# Patient Record
Sex: Male | Born: 1955
Health system: Southern US, Community
[De-identification: ages and names within clinical notes are randomized; demographics above are authoritative.]

## PROBLEM LIST (undated history)

## (undated) DIAGNOSIS — I10 Essential (primary) hypertension: Secondary | ICD-10-CM

## (undated) DIAGNOSIS — E119 Type 2 diabetes mellitus without complications: Secondary | ICD-10-CM

## (undated) DIAGNOSIS — R7303 Prediabetes: Secondary | ICD-10-CM

## (undated) DIAGNOSIS — D509 Iron deficiency anemia, unspecified: Secondary | ICD-10-CM

## (undated) DIAGNOSIS — G4730 Sleep apnea, unspecified: Secondary | ICD-10-CM

## (undated) DIAGNOSIS — H9190 Unspecified hearing loss, unspecified ear: Secondary | ICD-10-CM

## (undated) DIAGNOSIS — E785 Hyperlipidemia, unspecified: Secondary | ICD-10-CM

## (undated) HISTORY — PX: WHIPPLE PROCEDURE: SHX2667

## (undated) HISTORY — PX: COLONOSCOPY: SHX174

## (undated) HISTORY — DX: Unspecified hearing loss, unspecified ear: H91.90

## (undated) HISTORY — DX: Essential (primary) hypertension: I10

## (undated) HISTORY — PX: GIVENS CAPSULE STUDY: SHX5432

## (undated) HISTORY — DX: Type 2 diabetes mellitus without complications: E11.9

## (undated) HISTORY — DX: Hyperlipidemia, unspecified: E78.5

## (undated) HISTORY — PX: SHOULDER SURGERY: SHX246

## (undated) HISTORY — DX: Iron deficiency anemia, unspecified: D50.9

## (undated) HISTORY — PX: KNEE SURGERY: SHX244

## (undated) HISTORY — DX: Sleep apnea, unspecified: G47.30

## (undated) HISTORY — DX: Prediabetes: R73.03

---

## 2003-05-27 ENCOUNTER — Other Ambulatory Visit: Admission: RE | Admit: 2003-05-27 | Discharge: 2003-05-27 | Payer: Self-pay | Admitting: Otolaryngology

## 2007-03-13 ENCOUNTER — Ambulatory Visit (HOSPITAL_COMMUNITY): Admission: RE | Admit: 2007-03-13 | Discharge: 2007-03-13 | Payer: Self-pay | Admitting: Family Medicine

## 2007-03-13 IMAGING — CR DG CHEST 2V
2 series · 2 of 2 positions shown · non-contrast
Comparison: none

CLINICAL DATA: Cough.
 CHEST - 2 VIEW:
 PA and lateral chest - [DATE].

[view not recorded (1 of 2)]
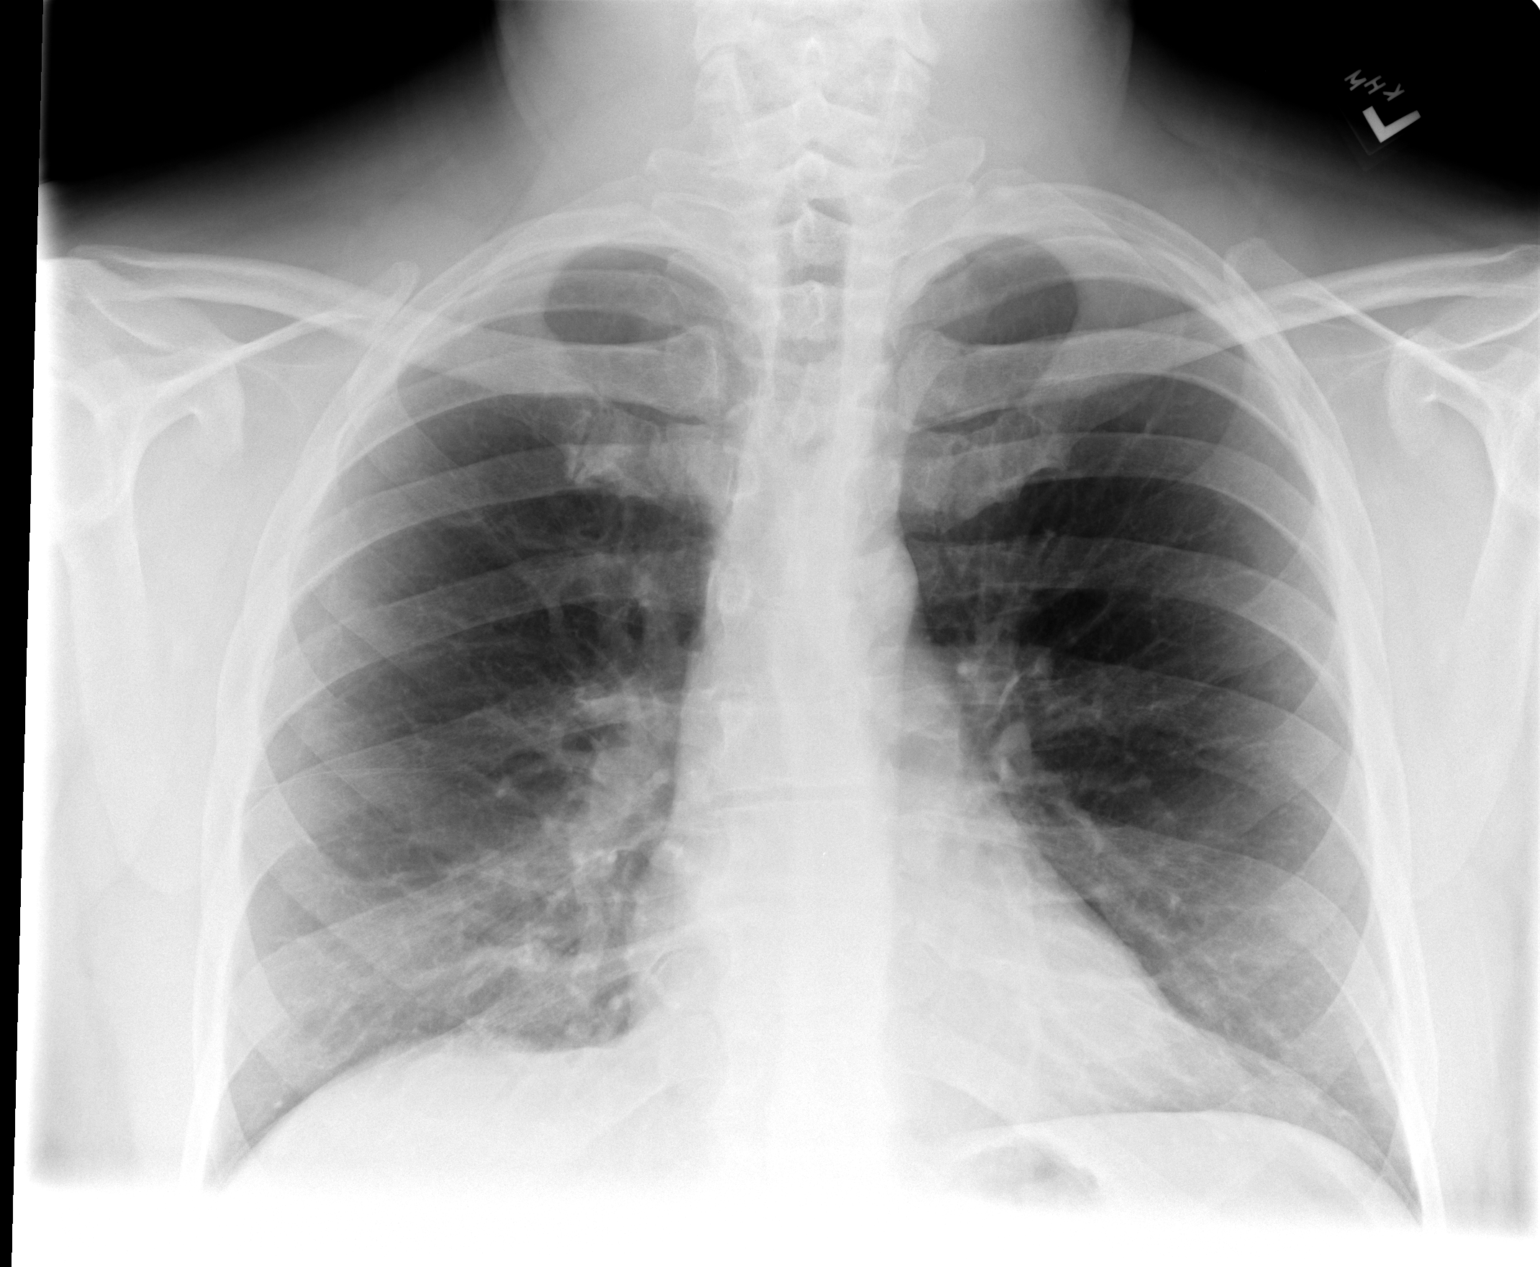

[view not recorded (2 of 2)]
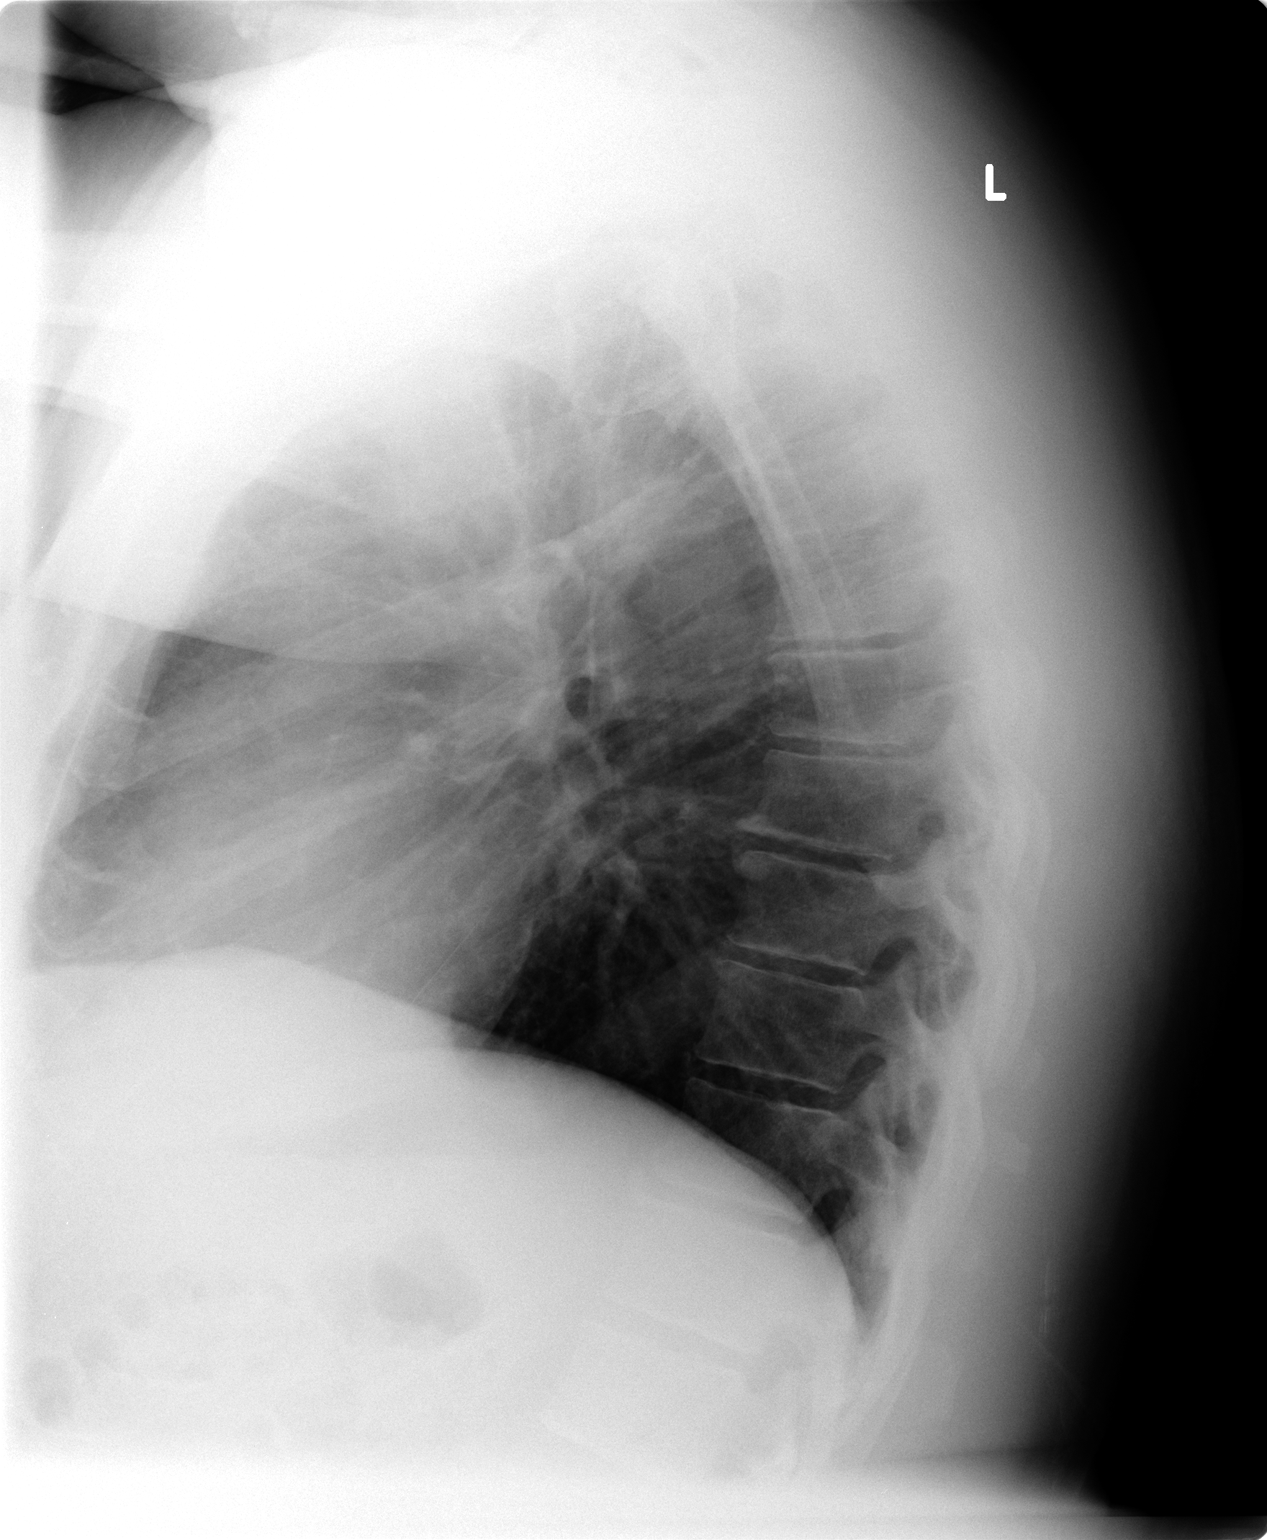

[2 of 2 positions shown; findings below may reference images not displayed]

FINDINGS: The heart size and vascularity are normal and the lungs are clear. No bony abnormality.
IMPRESSION: Normal chest.

## 2007-07-30 ENCOUNTER — Encounter: Payer: Self-pay | Admitting: Internal Medicine

## 2007-07-30 ENCOUNTER — Ambulatory Visit (HOSPITAL_COMMUNITY): Admission: RE | Admit: 2007-07-30 | Discharge: 2007-07-30 | Payer: Self-pay | Admitting: Internal Medicine

## 2007-07-30 ENCOUNTER — Ambulatory Visit: Payer: Self-pay | Admitting: Internal Medicine

## 2007-08-17 ENCOUNTER — Ambulatory Visit (HOSPITAL_COMMUNITY): Admission: RE | Admit: 2007-08-17 | Discharge: 2007-08-17 | Payer: Self-pay | Admitting: Internal Medicine

## 2007-08-25 ENCOUNTER — Ambulatory Visit: Payer: Self-pay | Admitting: Internal Medicine

## 2008-06-03 ENCOUNTER — Encounter: Admission: RE | Admit: 2008-06-03 | Discharge: 2008-06-03 | Payer: Self-pay | Admitting: Specialist

## 2008-06-03 IMAGING — US US EXTREM LOW VENOUS*L*
1 series · 14 of 24 positions shown · non-contrast
Comparison: None.

CLINICAL DATA: Calf swelling.  Recent knee arthroscopy.



[Series 1: us extrem low venous*left* · 14 of 27 slices shown]
[im 1/27]
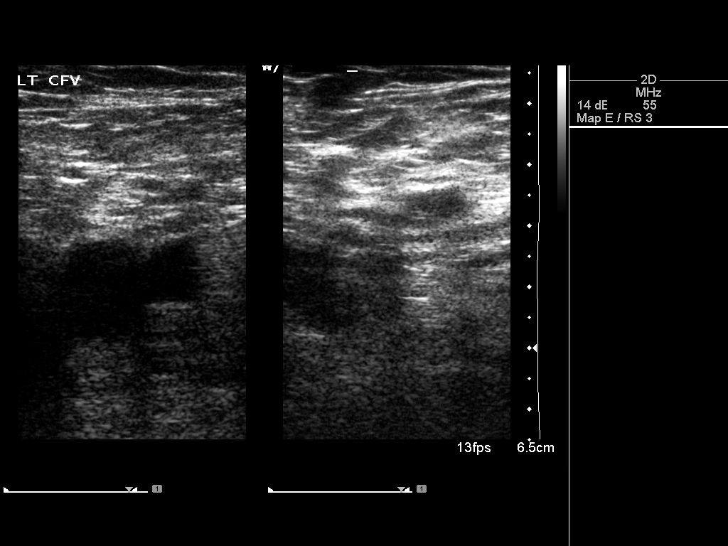
[im 3/27]
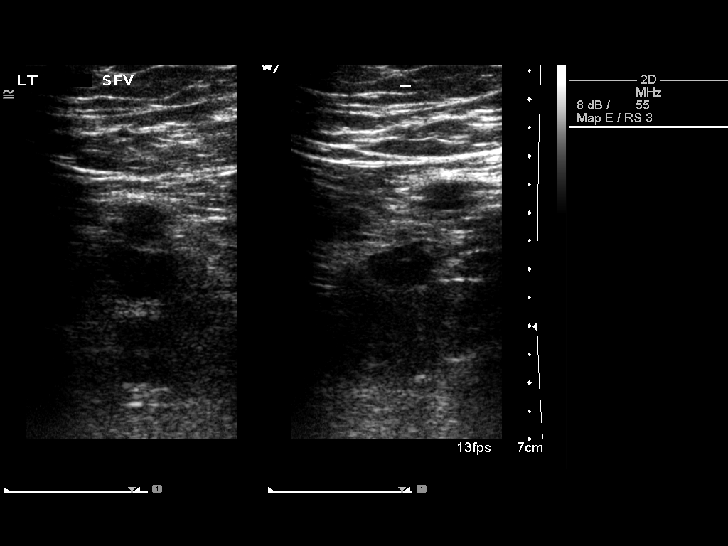
[im 5/27]
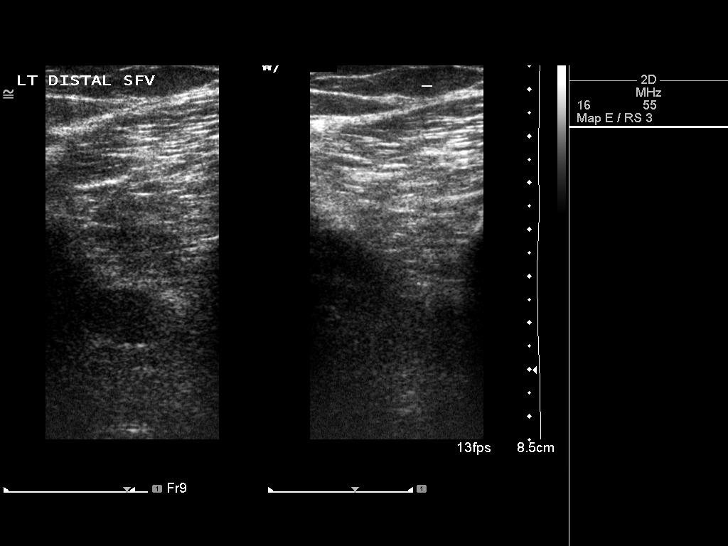
[im 7/27]
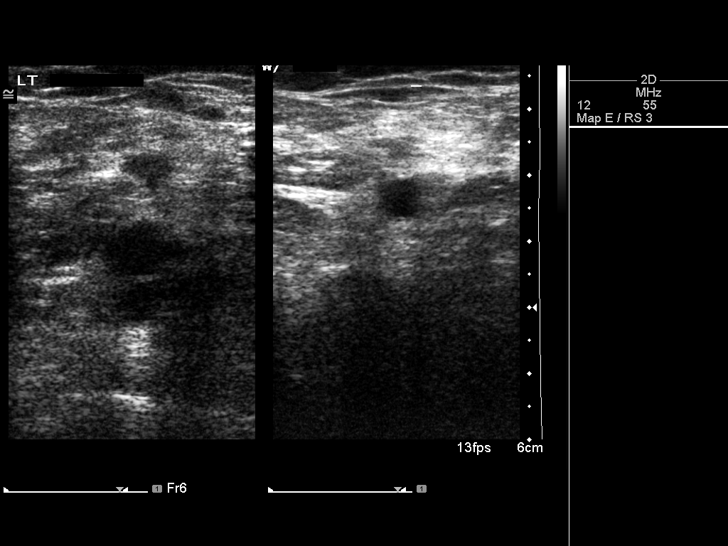
[im 8/27]
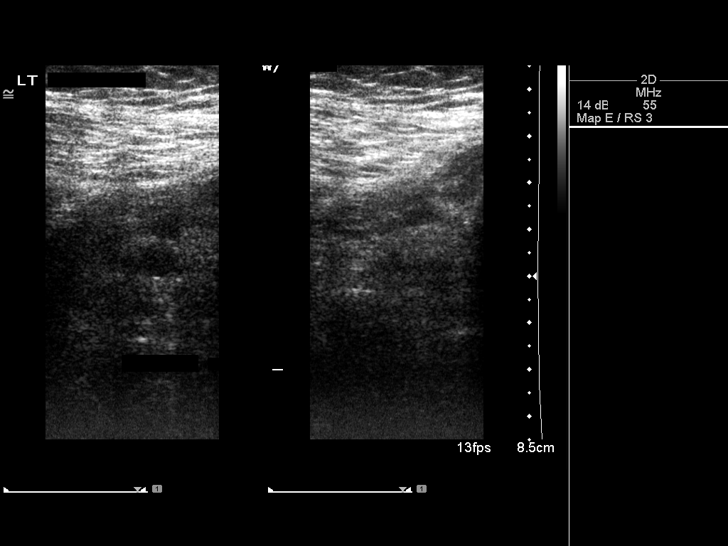
[im 11/27]
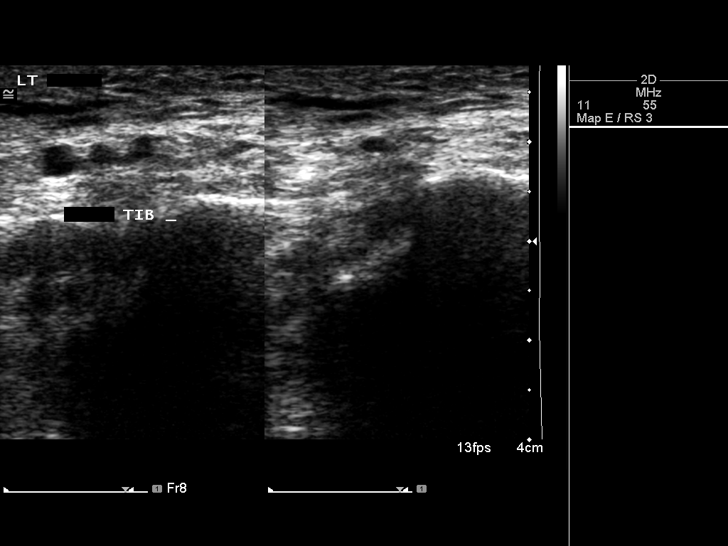
[im 13/27]
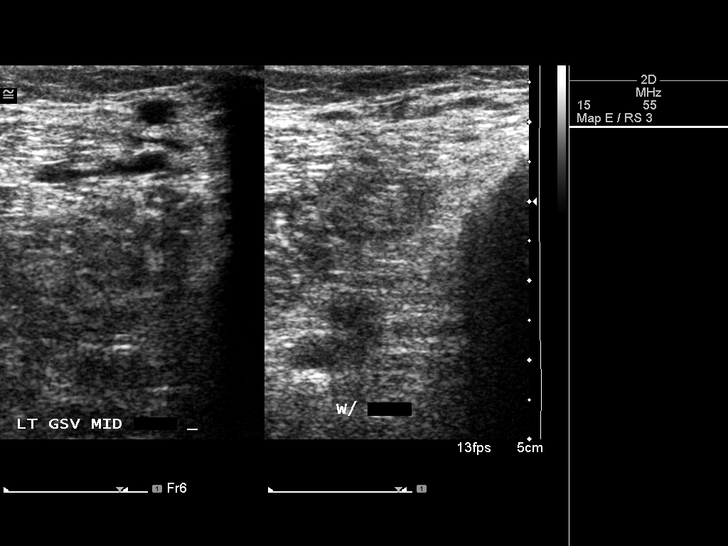
[im 14/27]
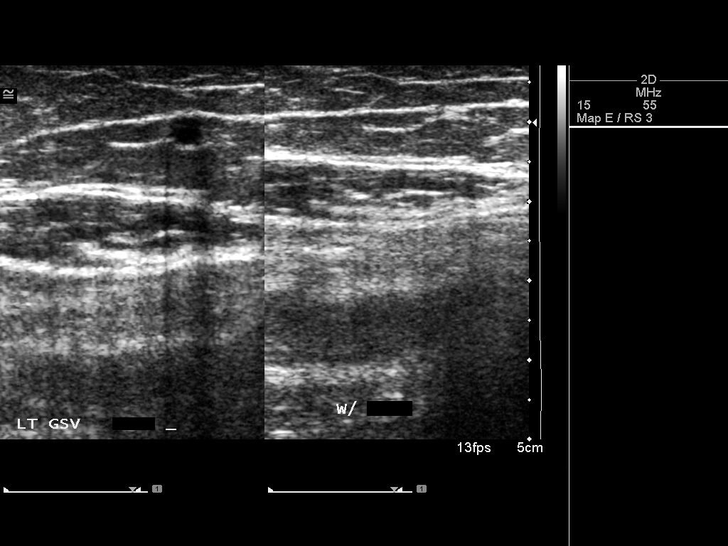
[im 16/27]
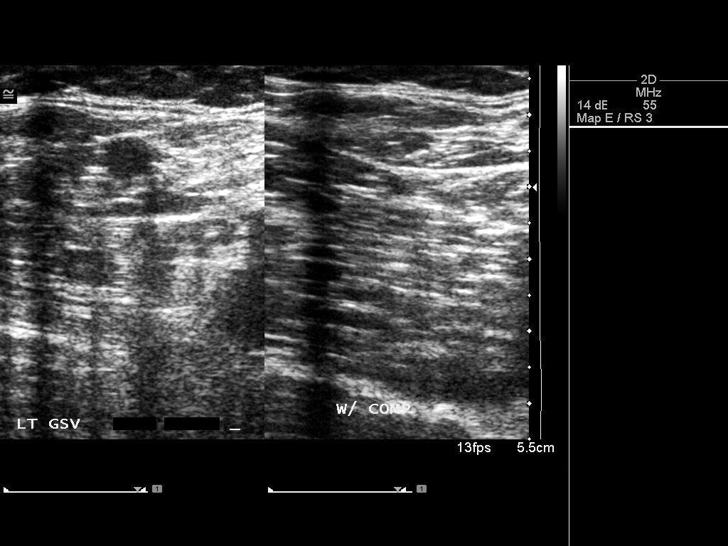
[im 19/27]
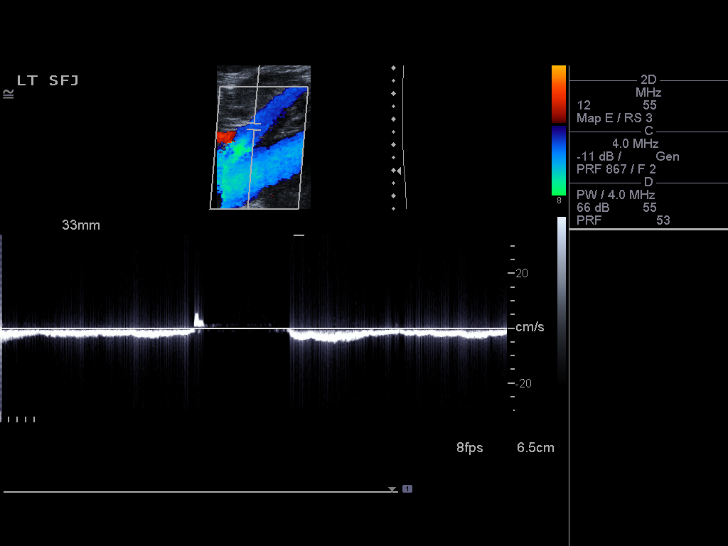
[im 21/27]
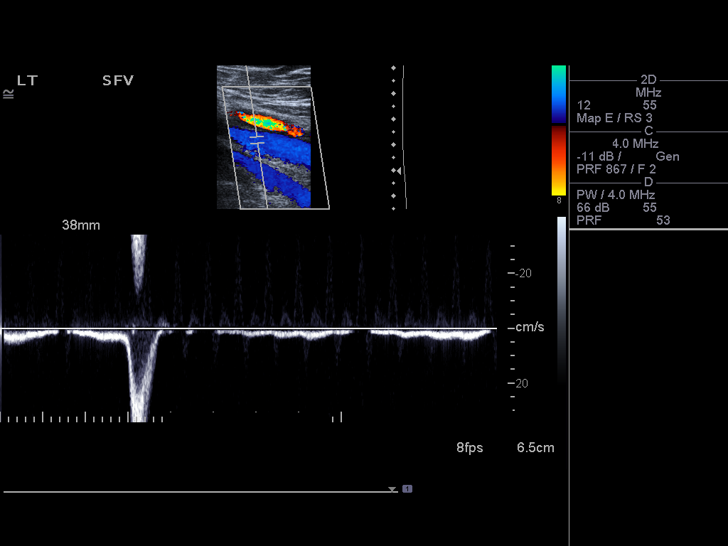
[im 22/27]
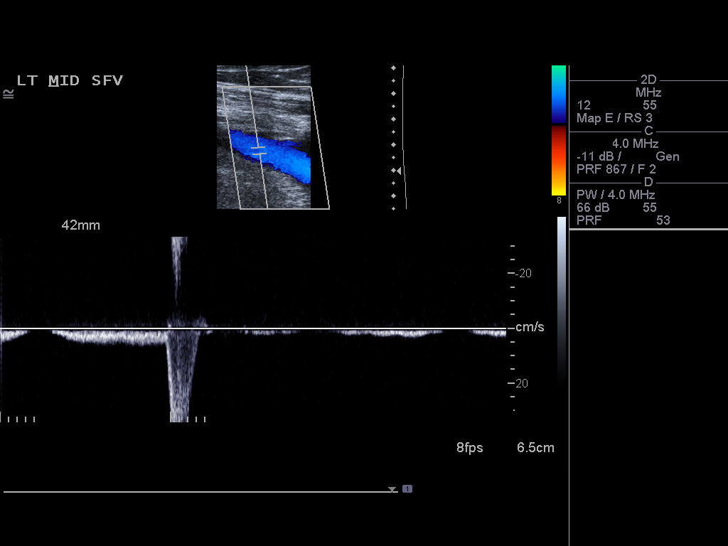
[im 24/27]
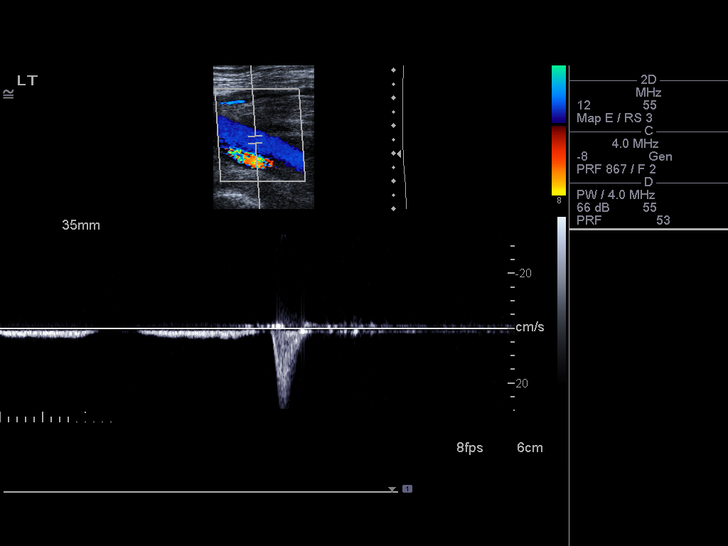
[im 27/27]
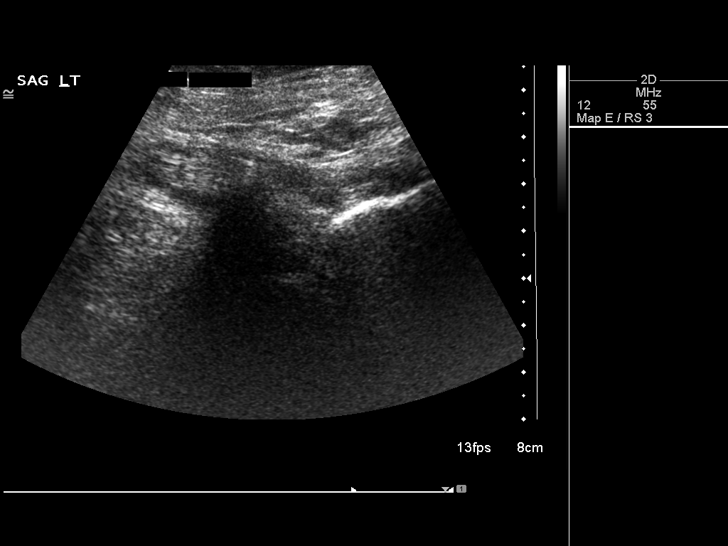

[14 of 24 positions shown; findings below may reference images not displayed]

FINDINGS: The left common femoral vein, superficial femoral vein,
deep femoral vein, saphenofemoral junction, and popliteal vein show
normal patent directional flow, normal phasicity, normal
augmentation, and normal compression.  The posterior tibial and
peroneal veins are patent where visualized in the upper calf.

Subcutaneous edema is noted in the lower leg.
IMPRESSION: No evidence for DVT in the left lower extremity.

## 2008-06-30 ENCOUNTER — Ambulatory Visit (HOSPITAL_BASED_OUTPATIENT_CLINIC_OR_DEPARTMENT_OTHER): Admission: RE | Admit: 2008-06-30 | Discharge: 2008-07-01 | Payer: Self-pay | Admitting: Specialist

## 2009-05-05 ENCOUNTER — Ambulatory Visit (HOSPITAL_BASED_OUTPATIENT_CLINIC_OR_DEPARTMENT_OTHER): Admission: RE | Admit: 2009-05-05 | Discharge: 2009-05-06 | Payer: Self-pay | Admitting: Specialist

## 2010-10-02 NOTE — Op Note (Signed)
NAME:  Alexander Banks, Alexander Banks              ACCOUNT NO.:  [id number]   MEDICAL RECORD NO.:  [id number]          PATIENT TYPE:  AMB   LOCATION:  DAY                           FACILITY:  [facility]   PHYSICIAN:  R. Roetta Sessions, M.D. DATE OF BIRTH:  May 13, 1956   DATE OF PROCEDURE:  07/30/2007  DATE OF DISCHARGE:                               OPERATIVE REPORT   PROCEDURE:  Ileocolonoscopy, snare polypectomy followed by EGD with  biopsy.   INDICATIONS FOR PROCEDURE:  The patient is a 55 year old gentleman  referred over by Dr. Lorin Picket A. Luking for further evaluation of iron  deficiency anemia.  Dr. Gerda Diss called me recently and was concerned  about the patient's anemia and bona fide iron deficiencies status.  I  understand he was hemoccult negative.  According to Dr. Gerda Diss his  ferritin was 4, IBC 473, serum iron 28 for saturation of 6%.  Hemoglobin  was in the 11 range.  This gentleman has a typical practice of donating  blood at the [facility].  He donated at least three times last year,  donated once in early January and subsequent to that was turned down as  he describes as his blood count was too low.  He has not had any  melena or hematochezia.  No abdominal pain.  Related no odynophagia,  dysphagia, early satiety, reflux symptoms.  No weight loss.  No family  history of inflammatory bowel disease or GI neoplasia.  Colonoscopy is  now being done to further evaluate iron deficiency anemia.  I told Mr.  Maglione if colonoscopy is not yielding as far as cause of ID we will  perform an EGD.  This approach has been reviewed with him in detail.  Risks, benefits and alternatives and limitations have been discussed and  questions answered.  Please see the doctor dictation in medical record.   PROCEDURE IN DETAIL:  O2 saturation, blood pressure, pulse and  respirations were monitored throughout the entirety of the procedure.  Conscious sedation with Versed 7 mg IV, Demerol 100 mg IV in divided  doses.   Cetacaine spray for topical pharyngeal anesthesia.   INSTRUMENTATION:  Pentax video chip system.   FINDINGS:  Digital rectal exam revealed no abnormalities.   Prep was good; mucosa surveyed from the rectosigmoid junction through  the left transverse right colon to the appendiceal orifice, ileocecal  valve and cecum where these structures were well seen and photographed  for the record.  From this level the scope was slowly and cautiously  withdrawn.  All previous mentioned mucosal surfaces were again seen.  The patient was noted to have scattered left-sided diverticula and in  the base of the cecum there is a 3 x 6 mm flat polyp.  Terminal ileum  was intubated to 10 cm.  The remainder of the colonic mucosa appeared  normal and the terminal ileal mucosa appeared normal.  The polyp in the  cecum was approached with the scope and the snare.  It was a very  difficult approach.  Ultimately I had to make two passes to remove this  polyp totally.  Two  pieces were submitted to the pathologist.  The  remainder of the colonic mucosa appeared entirely normal.  The scope was  pulled down in the rectum.  Thorough examination of the rectal mucosa  including retroflex view and anal verge demonstrated no abnormalities.  The patient tolerated the procedure well and was prepared for EGD.   Examination of the tubular esophagus revealed a noncritical Schatzki's  ring.  Otherwise esophageal mucosa was unremarkable.  The EG junction  was easily traversed.  The stomach, colon, gastric cavity was empty and  insufflated well with air.  A thorough examination of the gastric mucosa  including retroflexed view, proximal stomach, esophagogastric junction  demonstrated a small to moderate size hiatal hernia.  Otherwise gastric  mucosa appeared unremarkable.  The pylorus was patent and easily  traversed.  Examination of the bulb and second and third portion  revealed normal appearing mucosa.  Biopsies of the D2 and  D3 were taken  to rule out villous atrophy.  The patient tolerated both procedures very  well and was reactive after endoscopy.   IMPRESSION:  Colonoscopy.  Normal rectum, left-sided diverticula, polyp  in the cecum status post snare polypectomy, otherwise normal colonic  mucosa, normal terminal ileal mucosa.   EGD.  Noncritical Schatzki's ring not manipulated.  Otherwise  unremarkable esophagus.  Hiatal hernia.  Otherwise normal stomach,  patent pylorus and normal D1 to D3.  Status post biopsy D2 to D3.   I have to wonder if excessive blood donations is not the culprit here in  producing iron deficiency anemia.   RECOMMENDATIONS:  1. I have asked Mr. Reichenberger to refrain from donating blood at the [facility] until told otherwise by his physicians.  2. Will follow up on path.  3. Diverticulosis/polyp literature provided to Mr. Bucklin.  4. Further recommendations to follow.      Jonathon Bellows, M.D.  Electronically Signed     RMR/MEDQ  D:  07/30/2007  T:  07/31/2007  Job:  [id number]   cc:   Lorin Picket A. Gerda Diss, MD  Fax: [phone]

## 2010-10-02 NOTE — Op Note (Signed)
NAME:  Alexander Banks, Alexander Banks NO.:  [id number]   MEDICAL RECORD NO.:  [id number]          PATIENT TYPE:  AMB   LOCATION:  [facility]                         [facility]ACILITY:  [facility]   PHYSICIAN:  Erasmo Leventhal, M.D.DATE OF BIRTH:  1956/03/22   DATE OF PROCEDURE:  06/30/2008  DATE OF DISCHARGE:                               OPERATIVE REPORT   PREOPERATIVE DIAGNOSIS:  Left knee total meniscusand osteoarthritis.   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   PROCEDURE:  Left total knee partial medial meniscectomy, partial lateral  meniscectomy.  Chondroplasty of patellofemoral joint including medial  femoral condyle.   SURGEON:  Eugenia Mcalpine, M.D.   ASSISTANT:  Leilani Able PA-C.   ANESTHESIA:  Knee block with monitored anesthesia care.   ESTIMATED BLOOD LOSS:  Less than 10 mL.   DRAINS:  None.   COMPLICATIONS:  None.   TOURNIQUET TIME:  None.   DISPOSITION:  PACU stable.   OPERATIVE DETAILS:  The patient and family counseled in the holding  area.  Correct side was identified.  IV started, antibiotics and skin  block administered.  Taken to the operating room, placed in position  under MAC anesthesia.  Placed a thigh holder.  DuraPrep and draped in  sterile fashion.  Standard arthroscopic portals were established,  proximal medial, anteromedial and anterolateral.  They were anesthetized  with 10 mL of 0.25% Marcaine with epinephrine prior to this.   Following this, the knee was arthroscoped systematically.  The  patellofemoral joint  revealed normal tracking but there was grade 3  chondromalacia in the femoral joint and we did a mechanical  chondroplasty with a motorized shaver down to a stable base. ACL and PCL  were intact.  The lateral side was inspected.  There was a large  complex  tear at the lateral meniscus involving the anterior half of the lateral  meniscus with a pedunculated flap in the lateral gutter and a large flap  anteriorly.  This was debrided with a motorized shaver, smoothed down  with ArthroCare Minitec system.  No significant chondromalacia  laterally.   The medial side was inspected.  There was a recurrent tear of the  posterior horn of the medial meniscus which was debrided with a  motorized shaver.  A partial medial meniscectomy was performed back to a  stable base and appropriate level of contour.  Then, using the  ArthroCare Minitec system, we were very careful to excise the meniscus  only and not true cartilage and  smoothed down the edges nicely for a  partial medial meniscectomy.  There was also grade 3 chondromalacia  medial femoral condyle and a mechanicalchondroplasty performed with a  shaver back to stable edges.   The knee was irrigated and arthroscopic equipment was removed. Again,  the cruciate ligaments were normal.  Following this I performed closure  of the portals with nylon suture and 15 mL of 1% lidocaine with  epinephrine  and 4 mg of  morphine sulfate were injected into the knee  for pain and hemostasis.  Sterile dressing applied.  The patient was  taken from the operating room to PACU in stable condition.  No  complications or problems.   He will be stabilized in the PACU and kept overnight for pain control  and sleep apnea precautions.           ______________________________  Erasmo Leventhal, M.D.     RAC/MEDQ  D:  06/30/2008  T:  06/30/2008  Job:  [id number]

## 2010-10-02 NOTE — Op Note (Signed)
NAME:  Alexander Banks, WENGER              ACCOUNT NO.:  [id number]   MEDICAL RECORD NO.:  [id number]          PATIENT TYPE:  AMB   LOCATION:  DAY                           FACILITY:  [facility]   PHYSICIAN:  R. Roetta Sessions, M.D. DATE OF BIRTH:  May 25, 1955   DATE OF PROCEDURE:  08/17/2007  DATE OF DISCHARGE:  08/17/2007                               OPERATIVE REPORT   PROCEDURE:  Small bowel Givens capsule endoscopy.   REFERRING PHYSICIAN:  Charlaine Dalton. Wert, MD, FCCP   INDICATIONS FOR PROCEDURE:  Iron deficiency anemia with unremarkable  colonoscopy and EGD.  No explanation for iron deficiency anemia.   A 54 year old with bona fide iron deficiency indices with ferritin of 4,  serum iron 28, iron saturation 6%, and TIBC 473.  Hemoglobin in an 11  range.  Hemoccult negative.  The patient typically donates blood  frequently for the [employer].  He donated 3 times last year and donated  once in early January, but has subsequently been turned down due to  anemia.  EGD and ileocolonoscopy on July 30, 2007, revealed a left-  sided diverticula, hyperplastic polyp in the cecum was snared, and a  normal terminal ileum.  A noncritical Schatzki ring which was not  manipulated, hiatal hernia, negative biopsies of the small bowel with no  evidence of inflammation or villous atrophy.   FINDINGS:  The patient swallowed capsule without difficulty.  The first  gastric image was at 57 seconds.  Gastric passage time was 28 minutes.  First duodenal image was at 29 minutes and 11 seconds with small bowel  passage time of 5 hours and 14 minutes.  Small bowel mucosa appeared  normal.  No findings to explain iron deficiency anemia.   SUMMARY AND RECOMMENDATIONS:  Iron deficiency anemia, Hemoccult positive  stools with unremarkable small bowel Givens capsule endoscopy.  Small  bowel appeared normal with no explanation for the patient's iron  deficiency anemia.  We would recommend the patient to refrain from blood  donation at least for a couple more months until we can document  resolution of his iron deficiency anemia, may consider iron supplements.  I would ask him to follow up with Dr. Lilyan Punt for a recheck  hemoglobin and ferritin within the next couple of months.      Tana Coast, P.AJonathon Bellows, M.D.  Electronically Signed    LL/MEDQ  D:  08/25/2007  T:  08/26/2007  Job:  [id number]   cc:   Lorin Picket A. Gerda Diss, MD  Fax: [phone]

## 2012-09-26 ENCOUNTER — Other Ambulatory Visit: Payer: Self-pay | Admitting: Family Medicine

## 2012-10-19 ENCOUNTER — Ambulatory Visit (INDEPENDENT_AMBULATORY_CARE_PROVIDER_SITE_OTHER): Payer: Managed Care, Other (non HMO) | Admitting: Family Medicine

## 2012-10-19 ENCOUNTER — Encounter: Payer: Self-pay | Admitting: Family Medicine

## 2012-10-19 VITALS — BP 132/80 | Temp 98.4°F | Wt 275.0 lb

## 2012-10-19 DIAGNOSIS — M109 Gout, unspecified: Secondary | ICD-10-CM

## 2012-10-19 DIAGNOSIS — Z125 Encounter for screening for malignant neoplasm of prostate: Secondary | ICD-10-CM

## 2012-10-19 DIAGNOSIS — I10 Essential (primary) hypertension: Secondary | ICD-10-CM | POA: Insufficient documentation

## 2012-10-19 DIAGNOSIS — E785 Hyperlipidemia, unspecified: Secondary | ICD-10-CM | POA: Insufficient documentation

## 2012-10-19 LAB — BASIC METABOLIC PANEL WITH GFR
BUN: 17 mg/dL (ref 6–23)
CO2: 26 meq/L (ref 19–32)
Calcium: 9.5 mg/dL (ref 8.4–10.5)
Chloride: 104 meq/L (ref 96–112)
Creat: 1.22 mg/dL (ref 0.50–1.35)
Glucose, Bld: 93 mg/dL (ref 70–99)
Potassium: 5 meq/L (ref 3.5–5.3)
Sodium: 139 meq/L (ref 135–145)

## 2012-10-19 LAB — URIC ACID: Uric Acid, Serum: 7.8 mg/dL (ref 4.0–7.8)

## 2012-10-19 MED ORDER — COLCHICINE 0.6 MG PO TABS: 0.6000 mg | ORAL_TABLET | Freq: Two times a day (BID) | ORAL | Status: DC

## 2012-10-19 NOTE — Progress Notes (Signed)
  Subjective:    Patient ID: Alexander Banks, male    DOB: Jun 02, 1955, 57 y.o.   MRN: [id number]  Ankle Pain  The incident occurred 3 to 5 days ago. There was no injury mechanism. The pain is present in the left ankle. The quality of the pain is described as aching. The pain is at a severity of 3/10. The pain has been improving since onset. Associated symptoms include an inability to bear weight. He reports no foreign bodies present. The symptoms are aggravated by movement and weight bearing. He has tried ice, elevation and rest for the symptoms. The treatment provided no relief.   History hyperlipidemia, HTN   Review of Systems He denies twisting the ankle denies fever chills sweats.      Objective:   Physical Exam Calf normal knee normal distal foot normal moderate tenderness in the ankle and the Achilles tendon with some redness and swelling. Fairly good range of motion.       Assessment & Plan:  Probable gout-medication ordered. See orders. Plus also do lab work await results. May need to be on allopurinol. Followup wellness exam in the summer.

## 2012-10-29 NOTE — Addendum Note (Signed)
Addended by: Margaretha Sheffield on: 10/29/2012 09:09 AM   Modules accepted: Orders

## 2012-11-23 ENCOUNTER — Other Ambulatory Visit: Payer: Self-pay | Admitting: Family Medicine

## 2012-11-26 ENCOUNTER — Other Ambulatory Visit: Payer: Self-pay | Admitting: Family Medicine

## 2012-12-01 ENCOUNTER — Encounter: Payer: Managed Care, Other (non HMO) | Admitting: Family Medicine

## 2012-12-03 LAB — LIPID PANEL
Cholesterol: 159 mg/dL (ref 0–200)
HDL: 36 mg/dL — ABNORMAL LOW (ref >39)
Triglycerides: 215 mg/dL — ABNORMAL HIGH (ref <150)

## 2012-12-03 LAB — HEPATIC FUNCTION PANEL
ALT: 20 U/L (ref 0–53)
AST: 15 U/L (ref 0–37)
Albumin: 4.3 g/dL (ref 3.5–5.2)
Alkaline Phosphatase: 68 U/L (ref 39–117)
Indirect Bilirubin: 0.4 mg/dL (ref 0.0–0.9)

## 2012-12-04 LAB — PSA: PSA: 0.49 ng/mL (ref <=4.00)

## 2012-12-07 ENCOUNTER — Ambulatory Visit (INDEPENDENT_AMBULATORY_CARE_PROVIDER_SITE_OTHER): Payer: Managed Care, Other (non HMO) | Admitting: Family Medicine

## 2012-12-07 ENCOUNTER — Encounter: Payer: Self-pay | Admitting: Family Medicine

## 2012-12-07 VITALS — BP 130/80 | Wt 273.8 lb

## 2012-12-07 DIAGNOSIS — R7309 Other abnormal glucose: Secondary | ICD-10-CM

## 2012-12-07 DIAGNOSIS — E669 Obesity, unspecified: Secondary | ICD-10-CM | POA: Insufficient documentation

## 2012-12-07 DIAGNOSIS — G4733 Obstructive sleep apnea (adult) (pediatric): Secondary | ICD-10-CM | POA: Insufficient documentation

## 2012-12-07 DIAGNOSIS — M1990 Unspecified osteoarthritis, unspecified site: Secondary | ICD-10-CM | POA: Insufficient documentation

## 2012-12-07 DIAGNOSIS — E119 Type 2 diabetes mellitus without complications: Secondary | ICD-10-CM

## 2012-12-07 DIAGNOSIS — R7303 Prediabetes: Secondary | ICD-10-CM

## 2012-12-07 DIAGNOSIS — Z0000 Encounter for general adult medical examination without abnormal findings: Secondary | ICD-10-CM

## 2012-12-07 LAB — POCT GLYCOSYLATED HEMOGLOBIN (HGB A1C): Hemoglobin A1C: 6.4

## 2012-12-07 NOTE — Progress Notes (Signed)
  Subjective:    Patient ID: Alexander Banks, male    DOB: 21-Dec-1955, 57 y.o.   MRN: [id number]  HPI This patient comes in today mainly because of a wellness physical. He does have orthopedic problems in his knees and in his shoulders but he manages well with this he denies any chest pressure tightness pain or shortness of breath he denies any rectal bleeding or hematuria. He does not have any trouble at nighttime. He uses his CPAP machine. Past medical history of prediabetes hypertension hyperlipidemia He denies any angina-like symptoms.   Review of Systems  Constitutional: Negative for fever, activity change and appetite change.  HENT: Negative for congestion, rhinorrhea and neck pain.   Eyes: Negative for discharge.  Respiratory: Negative for cough and wheezing.   Cardiovascular: Negative for chest pain.  Gastrointestinal: Negative for vomiting, abdominal pain and blood in stool.  Genitourinary: Negative for frequency and difficulty urinating.  Skin: Negative for rash.  Allergic/Immunologic: Negative for environmental allergies and food allergies.  Neurological: Negative for weakness and headaches.  Psychiatric/Behavioral: Negative for agitation.       Objective:   Physical Exam  Nursing note and vitals reviewed. Constitutional: He appears well-developed and well-nourished.  HENT:  Head: Normocephalic and atraumatic.  Right Ear: External ear normal.  Left Ear: External ear normal.  Nose: Nose normal.  Mouth/Throat: Oropharynx is clear and moist.  Eyes: EOM are normal. Pupils are equal, round, and reactive to light.  Neck: Normal range of motion. Neck supple. No thyromegaly present.  Cardiovascular: Normal rate, regular rhythm and normal heart sounds.   No murmur heard. Pulmonary/Chest: Effort normal and breath sounds normal. No respiratory distress. He has no wheezes.  Abdominal: Soft. Bowel sounds are normal. He exhibits no distension and no mass. There is no tenderness.   Genitourinary: Prostate normal and penis normal.  Musculoskeletal: Normal range of motion. He exhibits no edema.  Lymphadenopathy:    He has no cervical adenopathy.  Neurological: He is alert. He exhibits normal muscle tone.  Skin: Skin is warm and dry. No erythema.  Psychiatric: He has a normal mood and affect. His behavior is normal. Judgment normal.          Assessment & Plan:  Wellness exam-overall doing well. Prediabetes foot exam is normal, A1c looks good at 6.4 continue medication watch diet continue to lose weight Patient had a polyp back in 2009. I will discuss this with GI to see if we need to do another colonoscopy at this point. Continue CPAP machine. Hyperlipidemia continue medication. Hypertension continue medication.

## 2013-02-24 ENCOUNTER — Other Ambulatory Visit: Payer: Self-pay | Admitting: Family Medicine

## 2013-03-25 ENCOUNTER — Other Ambulatory Visit: Payer: Self-pay

## 2013-05-20 ENCOUNTER — Other Ambulatory Visit: Payer: Self-pay | Admitting: Family Medicine

## 2013-05-27 ENCOUNTER — Telehealth: Payer: Self-pay | Admitting: Family Medicine

## 2013-05-27 DIAGNOSIS — I10 Essential (primary) hypertension: Secondary | ICD-10-CM

## 2013-05-27 DIAGNOSIS — Z79899 Other long term (current) drug therapy: Secondary | ICD-10-CM

## 2013-05-27 DIAGNOSIS — R7303 Prediabetes: Secondary | ICD-10-CM

## 2013-05-27 DIAGNOSIS — E785 Hyperlipidemia, unspecified: Secondary | ICD-10-CM

## 2013-05-27 NOTE — Telephone Encounter (Signed)
Patient had lipid, liver, PSA, uric acid, and A1C on 7/17

## 2013-05-27 NOTE — Telephone Encounter (Signed)
Patient needs blood work paper for his 72mth checkup.

## 2013-05-28 ENCOUNTER — Other Ambulatory Visit: Payer: Self-pay | Admitting: *Deleted

## 2013-05-28 DIAGNOSIS — E785 Hyperlipidemia, unspecified: Secondary | ICD-10-CM

## 2013-05-28 DIAGNOSIS — R7303 Prediabetes: Secondary | ICD-10-CM

## 2013-05-28 DIAGNOSIS — Z79899 Other long term (current) drug therapy: Secondary | ICD-10-CM

## 2013-05-28 DIAGNOSIS — I10 Essential (primary) hypertension: Secondary | ICD-10-CM

## 2013-05-28 NOTE — Addendum Note (Signed)
Addended byCharolotte Capuchin D on: 05/28/2013 08:14 AM   Modules accepted: Orders

## 2013-05-28 NOTE — Telephone Encounter (Signed)
Patient notified

## 2013-05-28 NOTE — Telephone Encounter (Signed)
Lipid, liver, hemoglobin Y0D, metabolic 7, uric acid level. Thank you.

## 2013-06-01 ENCOUNTER — Encounter: Payer: Self-pay | Admitting: Family Medicine

## 2013-06-01 LAB — HEPATIC FUNCTION PANEL
ALBUMIN: 4.6 g/dL (ref 3.5–5.2)
ALT: 24 U/L (ref 0–53)
AST: 30 U/L (ref 0–37)
Alkaline Phosphatase: 71 U/L (ref 39–117)
BILIRUBIN DIRECT: 0.1 mg/dL (ref 0.0–0.3)
BILIRUBIN INDIRECT: 0.5 mg/dL (ref 0.0–0.9)
Total Bilirubin: 0.6 mg/dL (ref 0.3–1.2)
Total Protein: 7.3 g/dL (ref 6.0–8.3)

## 2013-06-01 LAB — BASIC METABOLIC PANEL
BUN: 17 mg/dL (ref 6–23)
CO2: 27 meq/L (ref 19–32)
CREATININE: 1.1 mg/dL (ref 0.50–1.35)
Calcium: 9.2 mg/dL (ref 8.4–10.5)
Chloride: 105 mEq/L (ref 96–112)
Glucose, Bld: 98 mg/dL (ref 70–99)
Potassium: 4.8 mEq/L (ref 3.5–5.3)
Sodium: 140 mEq/L (ref 135–145)

## 2013-06-01 LAB — HEMOGLOBIN A1C
Hgb A1c MFr Bld: 6.5 % — ABNORMAL HIGH (ref <5.7)
Mean Plasma Glucose: 140 mg/dL — ABNORMAL HIGH (ref <117)

## 2013-06-01 LAB — LIPID PANEL
CHOL/HDL RATIO: 3.5 ratio
CHOLESTEROL: 150 mg/dL (ref 0–200)
HDL: 43 mg/dL (ref >39)
LDL CALC: 84 mg/dL (ref 0–99)
Triglycerides: 113 mg/dL (ref <150)
VLDL: 23 mg/dL (ref 0–40)

## 2013-06-01 LAB — URIC ACID: Uric Acid, Serum: 7.2 mg/dL (ref 4.0–7.8)

## 2013-06-29 ENCOUNTER — Other Ambulatory Visit: Payer: Self-pay | Admitting: Family Medicine

## 2013-07-01 ENCOUNTER — Other Ambulatory Visit: Payer: Self-pay

## 2013-07-01 MED ORDER — DICLOFENAC SODIUM 75 MG PO TBEC: DELAYED_RELEASE_TABLET | ORAL | Status: DC

## 2013-08-05 ENCOUNTER — Ambulatory Visit: Payer: Managed Care, Other (non HMO) | Admitting: Family Medicine

## 2013-08-10 ENCOUNTER — Encounter: Payer: Self-pay | Admitting: Family Medicine

## 2013-08-10 ENCOUNTER — Ambulatory Visit (INDEPENDENT_AMBULATORY_CARE_PROVIDER_SITE_OTHER): Payer: Managed Care, Other (non HMO) | Admitting: Family Medicine

## 2013-08-10 VITALS — BP 132/86 | Ht 75.0 in | Wt 274.0 lb

## 2013-08-10 DIAGNOSIS — Z23 Encounter for immunization: Secondary | ICD-10-CM

## 2013-08-10 DIAGNOSIS — M1990 Unspecified osteoarthritis, unspecified site: Secondary | ICD-10-CM

## 2013-08-10 DIAGNOSIS — R7303 Prediabetes: Secondary | ICD-10-CM

## 2013-08-10 DIAGNOSIS — E785 Hyperlipidemia, unspecified: Secondary | ICD-10-CM

## 2013-08-10 DIAGNOSIS — I10 Essential (primary) hypertension: Secondary | ICD-10-CM

## 2013-08-10 DIAGNOSIS — G4733 Obstructive sleep apnea (adult) (pediatric): Secondary | ICD-10-CM

## 2013-08-10 DIAGNOSIS — R7309 Other abnormal glucose: Secondary | ICD-10-CM

## 2013-08-10 MED ORDER — ATORVASTATIN CALCIUM 40 MG PO TABS: ORAL_TABLET | ORAL | Status: DC

## 2013-08-10 MED ORDER — LOSARTAN POTASSIUM 50 MG PO TABS: 50.0000 mg | ORAL_TABLET | Freq: Every day | ORAL | Status: DC

## 2013-08-10 MED ORDER — DICLOFENAC SODIUM 75 MG PO TBEC: DELAYED_RELEASE_TABLET | ORAL | Status: DC

## 2013-08-10 MED ORDER — METFORMIN HCL 500 MG PO TABS: ORAL_TABLET | ORAL | Status: DC

## 2013-08-10 NOTE — Progress Notes (Signed)
   Subjective:    Patient ID: Alexander Banks, male    DOB: [birth year], 58 y.o.   MRN: [id number]  HPI Patient arrives for a follow up on blood pressure and to discuss recent lab results. Long discussion held with patient regarding his numerous medical problems. Review of medications as well as diet safety was discussed. Patient doing good job taking his medication he is trying to stay healthy he states he is only smoking 3 cigarettes a day he was counseled strongly to quit.  Review of Systems  Constitutional: Negative for activity change, appetite change and fatigue.  Respiratory: Negative for cough.   Cardiovascular: Negative for chest pain.  Gastrointestinal: Negative for abdominal pain.  Endocrine: Negative for polydipsia and polyphagia.  Neurological: Negative for weakness.  Psychiatric/Behavioral: Negative for confusion.       Objective:   Physical Exam  Vitals reviewed. Constitutional: He appears well-nourished. No distress.  Cardiovascular: Normal rate, regular rhythm and normal heart sounds.   No murmur heard. Pulmonary/Chest: Effort normal and breath sounds normal. No respiratory distress.  Musculoskeletal: He exhibits no edema.  Lymphadenopathy:    He has no cervical adenopathy.  Neurological: He is alert.  Psychiatric: His behavior is normal.          Assessment & Plan:  #1 HTN good control continue current measures #2 patient encouraged try to lose weight #3 sleep apnea continue CPAP #4 prediabetes decent control overall #5 hyperlipidemia-continue current medications. #6 osteoarthritis this limits his physical activity. #7 patient encouraged quit smoking

## 2013-08-31 ENCOUNTER — Other Ambulatory Visit: Payer: Self-pay | Admitting: Family Medicine

## 2014-03-15 LAB — HM DIABETES EYE EXAM

## 2014-06-06 ENCOUNTER — Other Ambulatory Visit: Payer: Self-pay | Admitting: Family Medicine

## 2014-06-10 ENCOUNTER — Other Ambulatory Visit: Payer: Self-pay | Admitting: Family Medicine

## 2014-06-13 NOTE — Telephone Encounter (Signed)
Patient needs OV, May refill But send note to him stating no further refills till seen for his own health

## 2014-07-14 ENCOUNTER — Telehealth: Payer: Self-pay | Admitting: Family Medicine

## 2014-07-14 NOTE — Telephone Encounter (Signed)
Discussed with pt. Pt states he will look at schedule and call back to schedule appt and to get bw orders. i tried to put in orders for bw but pt wanted to wait on orders because he did not know when he was going to do it.

## 2014-07-14 NOTE — Telephone Encounter (Signed)
This patient has a history hyperlipidemia blood pressure issues and diabetes. He needs comprehensive lab work it is been 1 year since he is been here. Lipid, liver, hemoglobin J4N, metabolic 7, urine micro-protein, uric acid level. He also has a history of gout. Finally add a PSA for screening. Plus the patient needs to schedule an office visit in March no later then early April he should do the lab work before his office visit

## 2014-07-18 ENCOUNTER — Telehealth: Payer: Self-pay | Admitting: Family Medicine

## 2014-07-18 DIAGNOSIS — E790 Hyperuricemia without signs of inflammatory arthritis and tophaceous disease: Secondary | ICD-10-CM

## 2014-07-18 DIAGNOSIS — E785 Hyperlipidemia, unspecified: Secondary | ICD-10-CM

## 2014-07-18 DIAGNOSIS — R739 Hyperglycemia, unspecified: Secondary | ICD-10-CM

## 2014-07-18 DIAGNOSIS — Z79899 Other long term (current) drug therapy: Secondary | ICD-10-CM

## 2014-07-18 DIAGNOSIS — I10 Essential (primary) hypertension: Secondary | ICD-10-CM

## 2014-07-18 DIAGNOSIS — M109 Gout, unspecified: Secondary | ICD-10-CM

## 2014-07-18 DIAGNOSIS — Z125 Encounter for screening for malignant neoplasm of prostate: Secondary | ICD-10-CM

## 2014-07-18 NOTE — Telephone Encounter (Signed)
Pt notified and verbalized understanding to go to [facility] and be fasting. 

## 2014-07-18 NOTE — Telephone Encounter (Signed)
Patient needs order for BW for upcoming PE in April.

## 2014-07-18 NOTE — Telephone Encounter (Signed)
All of those tests plus PSA.

## 2014-07-18 NOTE — Telephone Encounter (Signed)
05/31/13: lip, liv, A1C, uric acid, Met 7

## 2014-08-16 ENCOUNTER — Other Ambulatory Visit: Payer: Self-pay | Admitting: Family Medicine

## 2014-08-16 NOTE — Telephone Encounter (Signed)
May refill half prescription 45 days on each patient needs office visit please send in noticed to the family

## 2014-08-20 LAB — HEPATIC FUNCTION PANEL
ALK PHOS: 70 IU/L (ref 39–117)
ALT: 36 IU/L (ref 0–44)
AST: 33 IU/L (ref 0–40)
Albumin: 4.8 g/dL (ref 3.5–5.5)
BILIRUBIN TOTAL: 0.6 mg/dL (ref 0.0–1.2)
BILIRUBIN, DIRECT: 0.16 mg/dL (ref 0.00–0.40)
TOTAL PROTEIN: 7.4 g/dL (ref 6.0–8.5)

## 2014-08-20 LAB — BASIC METABOLIC PANEL
BUN/Creatinine Ratio: 13 (ref 9–20)
BUN: 15 mg/dL (ref 6–24)
CO2: 24 mmol/L (ref 18–29)
Calcium: 9.5 mg/dL (ref 8.7–10.2)
Chloride: 102 mmol/L (ref 97–108)
Creatinine, Ser: 1.2 mg/dL (ref 0.76–1.27)
GFR calc Af Amer: 77 mL/min/{1.73_m2} (ref >59)
GFR calc non Af Amer: 67 mL/min/{1.73_m2} (ref >59)
GLUCOSE: 113 mg/dL — AB (ref 65–99)
Potassium: 4.8 mmol/L (ref 3.5–5.2)
Sodium: 142 mmol/L (ref 134–144)

## 2014-08-20 LAB — LIPID PANEL
CHOL/HDL RATIO: 4.6 ratio (ref 0.0–5.0)
Cholesterol, Total: 175 mg/dL (ref 100–199)
HDL: 38 mg/dL — ABNORMAL LOW (ref >39)
LDL Calculated: 96 mg/dL (ref 0–99)
Triglycerides: 206 mg/dL — ABNORMAL HIGH (ref 0–149)
VLDL Cholesterol Cal: 41 mg/dL — ABNORMAL HIGH (ref 5–40)

## 2014-08-20 LAB — HEMOGLOBIN A1C
Est. average glucose Bld gHb Est-mCnc: 134 mg/dL
HEMOGLOBIN A1C: 6.3 % — AB (ref 4.8–5.6)

## 2014-08-20 LAB — PSA: PSA: 0.4 ng/mL (ref 0.0–4.0)

## 2014-08-20 LAB — URIC ACID: Uric Acid: 8.3 mg/dL (ref 3.7–8.6)

## 2014-08-29 ENCOUNTER — Encounter: Payer: Self-pay | Admitting: Family Medicine

## 2014-08-29 ENCOUNTER — Ambulatory Visit (INDEPENDENT_AMBULATORY_CARE_PROVIDER_SITE_OTHER): Payer: Managed Care, Other (non HMO) | Admitting: Family Medicine

## 2014-08-29 VITALS — BP 140/82 | Ht 75.0 in | Wt 281.8 lb

## 2014-08-29 DIAGNOSIS — E785 Hyperlipidemia, unspecified: Secondary | ICD-10-CM | POA: Diagnosis not present

## 2014-08-29 DIAGNOSIS — I10 Essential (primary) hypertension: Secondary | ICD-10-CM

## 2014-08-29 DIAGNOSIS — R7309 Other abnormal glucose: Secondary | ICD-10-CM

## 2014-08-29 DIAGNOSIS — M170 Bilateral primary osteoarthritis of knee: Secondary | ICD-10-CM

## 2014-08-29 DIAGNOSIS — Z0000 Encounter for general adult medical examination without abnormal findings: Secondary | ICD-10-CM | POA: Diagnosis not present

## 2014-08-29 DIAGNOSIS — R5383 Other fatigue: Secondary | ICD-10-CM

## 2014-08-29 DIAGNOSIS — D649 Anemia, unspecified: Secondary | ICD-10-CM

## 2014-08-29 DIAGNOSIS — R7303 Prediabetes: Secondary | ICD-10-CM

## 2014-08-29 MED ORDER — LOSARTAN POTASSIUM 100 MG PO TABS: 100.0000 mg | ORAL_TABLET | Freq: Every day | ORAL | Status: DC
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
## 2014-09-01 ENCOUNTER — Ambulatory Visit: Payer: Managed Care, Other (non HMO) | Admitting: *Deleted

## 2014-09-01 DIAGNOSIS — H6123 Impacted cerumen, bilateral: Secondary | ICD-10-CM

## 2014-09-01 NOTE — Progress Notes (Signed)
Patient ID: Alexander Banks, male   DOB: April 15, 1956, 59 y.o.   MRN: [id number] Pt arrives today for bilateral ear irrigation. Unable to get wax out of ears. Called [facility] and they can see pt today at 3pm for bilateral ear irrigation. Pt notified of appt.

## 2014-09-27 ENCOUNTER — Encounter: Payer: Self-pay | Admitting: Family Medicine

## 2014-09-27 DIAGNOSIS — E039 Hypothyroidism, unspecified: Secondary | ICD-10-CM | POA: Insufficient documentation

## 2014-09-27 LAB — CBC WITH DIFFERENTIAL/PLATELET
Basophils Absolute: 0 10*3/uL (ref 0.0–0.2)
Basos: 0 %
EOS (ABSOLUTE): 0.1 10*3/uL (ref 0.0–0.4)
EOS: 1 %
HEMATOCRIT: 44.5 % (ref 37.5–51.0)
Hemoglobin: 15.5 g/dL (ref 12.6–17.7)
IMMATURE GRANS (ABS): 0 10*3/uL (ref 0.0–0.1)
Immature Granulocytes: 0 %
Lymphocytes Absolute: 2 10*3/uL (ref 0.7–3.1)
Lymphs: 29 %
MCH: 32.3 pg (ref 26.6–33.0)
MCHC: 34.8 g/dL (ref 31.5–35.7)
MCV: 93 fL (ref 79–97)
MONOCYTES: 10 %
Monocytes Absolute: 0.7 10*3/uL (ref 0.1–0.9)
NEUTROS PCT: 60 %
Neutrophils Absolute: 4.1 10*3/uL (ref 1.4–7.0)
Platelets: 250 10*3/uL (ref 150–379)
RBC: 4.8 x10E6/uL (ref 4.14–5.80)
RDW: 13.4 % (ref 12.3–15.4)
WBC: 6.9 10*3/uL (ref 3.4–10.8)

## 2014-09-27 LAB — TSH: TSH: 121.7 u[IU]/mL — ABNORMAL HIGH (ref 0.450–4.500)

## 2014-09-27 LAB — FERRITIN: Ferritin: 51 ng/mL (ref 30–400)

## 2014-09-29 ENCOUNTER — Telehealth: Payer: Self-pay | Admitting: Family Medicine

## 2014-09-29 DIAGNOSIS — E038 Other specified hypothyroidism: Secondary | ICD-10-CM

## 2014-09-29 MED ORDER — LEVOTHYROXINE SODIUM 100 MCG PO TABS: 100.0000 ug | ORAL_TABLET | Freq: Every day | ORAL | Status: DC

## 2014-09-29 NOTE — Telephone Encounter (Signed)
Patient's test results show hypothyroidism I informed him of this result we will send in his medication 100 g Synthroid daily recheck TSH in 8 weeks

## 2014-10-05 ENCOUNTER — Other Ambulatory Visit: Payer: Self-pay | Admitting: Family Medicine

## 2014-10-10 ENCOUNTER — Other Ambulatory Visit: Payer: Self-pay | Admitting: *Deleted

## 2014-10-10 DIAGNOSIS — D649 Anemia, unspecified: Secondary | ICD-10-CM

## 2014-10-10 LAB — POC HEMOCCULT BLD/STL (HOME/3-CARD/SCREEN)
Card #2 Fecal Occult Blod, POC: NEGATIVE
Card #3 Fecal Occult Blood, POC: NEGATIVE
Fecal Occult Blood, POC: NEGATIVE

## 2014-11-18 ENCOUNTER — Other Ambulatory Visit: Payer: Self-pay | Admitting: Family Medicine

## 2014-11-18 NOTE — Telephone Encounter (Signed)
Last seen 4.11.16.

## 2014-11-18 NOTE — Telephone Encounter (Signed)
Ok plus three monthly ref 

## 2014-11-29 LAB — TSH: TSH: 26.21 u[IU]/mL — AB (ref 0.450–4.500)

## 2014-11-29 LAB — T4, FREE: FREE T4: 0.92 ng/dL (ref 0.82–1.77)

## 2014-12-01 ENCOUNTER — Other Ambulatory Visit: Payer: Self-pay | Admitting: Family Medicine

## 2014-12-01 MED ORDER — LEVOTHYROXINE SODIUM 150 MCG PO TABS: 150.0000 ug | ORAL_TABLET | Freq: Every day | ORAL | Status: DC

## 2014-12-09 ENCOUNTER — Other Ambulatory Visit: Payer: Self-pay | Admitting: Family Medicine

## 2015-01-05 ENCOUNTER — Other Ambulatory Visit: Payer: Self-pay | Admitting: Family Medicine

## 2015-01-30 ENCOUNTER — Other Ambulatory Visit: Payer: Self-pay | Admitting: *Deleted

## 2015-01-30 DIAGNOSIS — E039 Hypothyroidism, unspecified: Secondary | ICD-10-CM

## 2015-01-31 ENCOUNTER — Other Ambulatory Visit: Payer: Self-pay

## 2015-01-31 DIAGNOSIS — E039 Hypothyroidism, unspecified: Secondary | ICD-10-CM

## 2015-01-31 LAB — TSH: TSH: 12.8 u[IU]/mL — AB (ref 0.450–4.500)

## 2015-01-31 MED ORDER — LEVOTHYROXINE SODIUM 200 MCG PO TABS: 200.0000 ug | ORAL_TABLET | Freq: Every day | ORAL | Status: DC

## 2015-02-07 ENCOUNTER — Other Ambulatory Visit: Payer: Self-pay | Admitting: Family Medicine

## 2015-03-29 LAB — TSH: TSH: 1.15 u[IU]/mL (ref 0.450–4.500)

## 2015-03-29 LAB — HM DIABETES EYE EXAM

## 2015-05-05 ENCOUNTER — Other Ambulatory Visit: Payer: Self-pay | Admitting: Family Medicine

## 2015-06-01 ENCOUNTER — Telehealth: Payer: Self-pay | Admitting: Family Medicine

## 2015-06-01 DIAGNOSIS — E039 Hypothyroidism, unspecified: Secondary | ICD-10-CM

## 2015-06-01 DIAGNOSIS — Z125 Encounter for screening for malignant neoplasm of prostate: Secondary | ICD-10-CM

## 2015-06-01 DIAGNOSIS — Z139 Encounter for screening, unspecified: Secondary | ICD-10-CM

## 2015-06-01 DIAGNOSIS — I10 Essential (primary) hypertension: Secondary | ICD-10-CM

## 2015-06-01 DIAGNOSIS — E669 Obesity, unspecified: Secondary | ICD-10-CM

## 2015-06-01 DIAGNOSIS — R7303 Prediabetes: Secondary | ICD-10-CM

## 2015-06-01 DIAGNOSIS — E785 Hyperlipidemia, unspecified: Secondary | ICD-10-CM

## 2015-06-01 NOTE — Telephone Encounter (Signed)
And uric acid plz

## 2015-06-01 NOTE — Telephone Encounter (Signed)
TSH/Met 7/Lipid/Liver/A1C/PSA

## 2015-06-01 NOTE — Telephone Encounter (Signed)
Last labs 4/16- Lipid, liver, hgbA1c, met 7, uric acid, tsh

## 2015-06-01 NOTE — Telephone Encounter (Signed)
Calling to request to have blood work ordered.

## 2015-06-02 NOTE — Telephone Encounter (Signed)
Called patient and informed him per Dr.Scott Luking- labs ordered, and patient needs to be fasting. Patient verbalized understanding.

## 2015-06-02 NOTE — Telephone Encounter (Signed)
[facility] 06/02/15

## 2015-06-05 LAB — HEPATIC FUNCTION PANEL
ALBUMIN: 4.4 g/dL (ref 3.5–5.5)
ALT: 27 IU/L (ref 0–44)
AST: 22 IU/L (ref 0–40)
Alkaline Phosphatase: 88 IU/L (ref 39–117)
BILIRUBIN TOTAL: 0.5 mg/dL (ref 0.0–1.2)
Bilirubin, Direct: 0.15 mg/dL (ref 0.00–0.40)
Total Protein: 7.2 g/dL (ref 6.0–8.5)

## 2015-06-05 LAB — BASIC METABOLIC PANEL
BUN / CREAT RATIO: 13 (ref 9–20)
BUN: 13 mg/dL (ref 6–24)
CO2: 22 mmol/L (ref 18–29)
CREATININE: 1.03 mg/dL (ref 0.76–1.27)
Calcium: 9.4 mg/dL (ref 8.7–10.2)
Chloride: 103 mmol/L (ref 96–106)
GFR calc Af Amer: 93 mL/min/{1.73_m2} (ref >59)
GFR, EST NON AFRICAN AMERICAN: 80 mL/min/{1.73_m2} (ref >59)
Glucose: 135 mg/dL — ABNORMAL HIGH (ref 65–99)
Potassium: 4.8 mmol/L (ref 3.5–5.2)
SODIUM: 144 mmol/L (ref 134–144)

## 2015-06-05 LAB — PSA: Prostate Specific Ag, Serum: 0.8 ng/mL (ref 0.0–4.0)

## 2015-06-05 LAB — LIPID PANEL
CHOL/HDL RATIO: 4.6 ratio (ref 0.0–5.0)
Cholesterol, Total: 156 mg/dL (ref 100–199)
HDL: 34 mg/dL — ABNORMAL LOW (ref >39)
LDL CALC: 77 mg/dL (ref 0–99)
TRIGLYCERIDES: 223 mg/dL — AB (ref 0–149)
VLDL CHOLESTEROL CAL: 45 mg/dL — AB (ref 5–40)

## 2015-06-05 LAB — URIC ACID: Uric Acid: 7.4 mg/dL (ref 3.7–8.6)

## 2015-06-05 LAB — TSH: TSH: 1.8 u[IU]/mL (ref 0.450–4.500)

## 2015-06-30 ENCOUNTER — Other Ambulatory Visit: Payer: Self-pay | Admitting: Family Medicine

## 2015-07-07 ENCOUNTER — Other Ambulatory Visit: Payer: Self-pay | Admitting: Family Medicine

## 2015-07-07 NOTE — Telephone Encounter (Signed)
Last seen April 2016. Patient now down to 7 tablets. May we refill?

## 2015-07-07 NOTE — Telephone Encounter (Signed)
May give this and 1 refill

## 2015-08-07 ENCOUNTER — Other Ambulatory Visit: Payer: Self-pay | Admitting: Family Medicine

## 2015-08-07 NOTE — Telephone Encounter (Signed)
May give this and one additional refill, please also indicate needs office visit

## 2015-08-07 NOTE — Telephone Encounter (Signed)
Patient last seen on April 2016. May we refill?

## 2015-09-07 ENCOUNTER — Other Ambulatory Visit: Payer: Self-pay | Admitting: Family Medicine

## 2015-09-07 NOTE — Telephone Encounter (Signed)
Last seen April 2016 may we refill?

## 2015-09-07 NOTE — Telephone Encounter (Signed)
1 rf each send card to pt needs ov

## 2015-11-06 ENCOUNTER — Other Ambulatory Visit: Payer: Self-pay | Admitting: Family Medicine

## 2015-11-24 ENCOUNTER — Other Ambulatory Visit: Payer: Self-pay | Admitting: Family Medicine

## 2015-11-24 ENCOUNTER — Telehealth: Payer: Self-pay | Admitting: Family Medicine

## 2015-11-24 DIAGNOSIS — E785 Hyperlipidemia, unspecified: Secondary | ICD-10-CM

## 2015-11-24 DIAGNOSIS — I10 Essential (primary) hypertension: Secondary | ICD-10-CM

## 2015-11-24 DIAGNOSIS — R7303 Prediabetes: Secondary | ICD-10-CM

## 2015-11-24 DIAGNOSIS — E038 Other specified hypothyroidism: Secondary | ICD-10-CM

## 2015-11-24 NOTE — Telephone Encounter (Signed)
Bw orders for pe   Last bw 06/03/15   Uric Acid, TSH, BMP, Hep, Lip, PSA

## 2015-11-24 NOTE — Telephone Encounter (Signed)
p e/chronic visit scheduled with scott???

## 2015-11-24 NOTE — Telephone Encounter (Signed)
Lipid, liver, metabolic 7, hemoglobin [id number], TSH-hypothyroidism hyperlipidemia hyperglycemia hypertension

## 2015-11-24 NOTE — Telephone Encounter (Signed)
Blood work ordered in EPIC. Patient notified. 

## 2015-11-29 LAB — BASIC METABOLIC PANEL
BUN / CREAT RATIO: 15 (ref 9–20)
BUN: 18 mg/dL (ref 6–24)
CHLORIDE: 102 mmol/L (ref 96–106)
CO2: 22 mmol/L (ref 18–29)
Calcium: 9.5 mg/dL (ref 8.7–10.2)
Creatinine, Ser: 1.19 mg/dL (ref 0.76–1.27)
GFR calc Af Amer: 77 mL/min/{1.73_m2} (ref >59)
GFR calc non Af Amer: 67 mL/min/{1.73_m2} (ref >59)
GLUCOSE: 126 mg/dL — AB (ref 65–99)
POTASSIUM: 5.3 mmol/L — AB (ref 3.5–5.2)
SODIUM: 140 mmol/L (ref 134–144)

## 2015-11-29 LAB — HEMOGLOBIN A1C
Est. average glucose Bld gHb Est-mCnc: 157 mg/dL
HEMOGLOBIN A1C: 7.1 % — AB (ref 4.8–5.6)

## 2015-11-29 LAB — HEPATIC FUNCTION PANEL
ALK PHOS: 88 IU/L (ref 39–117)
ALT: 33 IU/L (ref 0–44)
AST: 26 IU/L (ref 0–40)
Albumin: 4.5 g/dL (ref 3.5–5.5)
BILIRUBIN, DIRECT: 0.13 mg/dL (ref 0.00–0.40)
Bilirubin Total: 0.5 mg/dL (ref 0.0–1.2)
Total Protein: 7.2 g/dL (ref 6.0–8.5)

## 2015-11-29 LAB — LIPID PANEL
Chol/HDL Ratio: 4.7 ratio units (ref 0.0–5.0)
Cholesterol, Total: 175 mg/dL (ref 100–199)
HDL: 37 mg/dL — AB (ref >39)
LDL Calculated: 82 mg/dL (ref 0–99)
TRIGLYCERIDES: 278 mg/dL — AB (ref 0–149)
VLDL CHOLESTEROL CAL: 56 mg/dL — AB (ref 5–40)

## 2015-11-29 LAB — TSH: TSH: 1.83 u[IU]/mL (ref 0.450–4.500)

## 2015-12-11 ENCOUNTER — Encounter: Payer: Self-pay | Admitting: Family Medicine

## 2015-12-11 ENCOUNTER — Ambulatory Visit (INDEPENDENT_AMBULATORY_CARE_PROVIDER_SITE_OTHER): Payer: Managed Care, Other (non HMO) | Admitting: Family Medicine

## 2015-12-11 VITALS — BP 132/70 | Ht 73.5 in | Wt 267.0 lb

## 2015-12-11 DIAGNOSIS — E785 Hyperlipidemia, unspecified: Secondary | ICD-10-CM

## 2015-12-11 DIAGNOSIS — G4733 Obstructive sleep apnea (adult) (pediatric): Secondary | ICD-10-CM | POA: Diagnosis not present

## 2015-12-11 DIAGNOSIS — I10 Essential (primary) hypertension: Secondary | ICD-10-CM | POA: Diagnosis not present

## 2015-12-11 DIAGNOSIS — Z0000 Encounter for general adult medical examination without abnormal findings: Secondary | ICD-10-CM | POA: Diagnosis not present

## 2015-12-11 DIAGNOSIS — E038 Other specified hypothyroidism: Secondary | ICD-10-CM

## 2015-12-11 DIAGNOSIS — R7303 Prediabetes: Secondary | ICD-10-CM | POA: Diagnosis not present

## 2015-12-11 MED ORDER — ATORVASTATIN CALCIUM 80 MG PO TABS: 80.0000 mg | ORAL_TABLET | Freq: Every day | ORAL | 6 refills | Status: DC

## 2015-12-11 MED ORDER — LEVOTHYROXINE SODIUM 200 MCG PO TABS: ORAL_TABLET | ORAL | 6 refills | Status: DC

## 2015-12-11 MED ORDER — LOSARTAN POTASSIUM 100 MG PO TABS: 100.0000 mg | ORAL_TABLET | Freq: Every day | ORAL | 6 refills | Status: DC
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
## 2016-01-24 ENCOUNTER — Other Ambulatory Visit: Payer: Self-pay | Admitting: Family Medicine

## 2016-01-24 MED ORDER — METFORMIN HCL 500 MG PO TABS: 500.0000 mg | ORAL_TABLET | Freq: Two times a day (BID) | ORAL | 1 refills | Status: DC

## 2016-04-24 LAB — HM DIABETES EYE EXAM

## 2016-04-29 ENCOUNTER — Telehealth: Payer: Self-pay | Admitting: Family Medicine

## 2016-04-29 DIAGNOSIS — R5383 Other fatigue: Secondary | ICD-10-CM

## 2016-04-29 DIAGNOSIS — E119 Type 2 diabetes mellitus without complications: Secondary | ICD-10-CM

## 2016-04-29 DIAGNOSIS — Z125 Encounter for screening for malignant neoplasm of prostate: Secondary | ICD-10-CM

## 2016-04-29 DIAGNOSIS — Z79899 Other long term (current) drug therapy: Secondary | ICD-10-CM

## 2016-04-29 DIAGNOSIS — E785 Hyperlipidemia, unspecified: Secondary | ICD-10-CM

## 2016-04-29 NOTE — Telephone Encounter (Signed)
Lipid, liver, metabolic 7, TSH, hemoglobin A1c, PSA

## 2016-04-29 NOTE — Telephone Encounter (Signed)
Requesting order for blood work for appointment in January.  He is seeing Dr. Nicki Reaper.

## 2016-04-29 NOTE — Telephone Encounter (Signed)
Notified patient bloodwork has been ordered.  

## 2016-05-09 ENCOUNTER — Encounter: Payer: Self-pay | Admitting: Pediatrics

## 2016-06-01 LAB — TSH: TSH: 0.927 u[IU]/mL (ref 0.450–4.500)

## 2016-06-01 LAB — LIPID PANEL
CHOL/HDL RATIO: 4.6 ratio (ref 0.0–5.0)
Cholesterol, Total: 161 mg/dL (ref 100–199)
HDL: 35 mg/dL — AB (ref >39)
LDL Calculated: 70 mg/dL (ref 0–99)
Triglycerides: 278 mg/dL — ABNORMAL HIGH (ref 0–149)
VLDL CHOLESTEROL CAL: 56 mg/dL — AB (ref 5–40)

## 2016-06-01 LAB — BASIC METABOLIC PANEL
BUN/Creatinine Ratio: 15 (ref 9–20)
BUN: 20 mg/dL (ref 6–24)
CO2: 22 mmol/L (ref 18–29)
CREATININE: 1.37 mg/dL — AB (ref 0.76–1.27)
Calcium: 9.2 mg/dL (ref 8.7–10.2)
Chloride: 101 mmol/L (ref 96–106)
GFR calc Af Amer: 65 mL/min/{1.73_m2} (ref >59)
GFR calc non Af Amer: 56 mL/min/{1.73_m2} — ABNORMAL LOW (ref >59)
GLUCOSE: 189 mg/dL — AB (ref 65–99)
Potassium: 5.1 mmol/L (ref 3.5–5.2)
SODIUM: 139 mmol/L (ref 134–144)

## 2016-06-01 LAB — HEMOGLOBIN A1C
Est. average glucose Bld gHb Est-mCnc: 183 mg/dL
HEMOGLOBIN A1C: 8 % — AB (ref 4.8–5.6)

## 2016-06-01 LAB — HEPATIC FUNCTION PANEL
ALK PHOS: 90 IU/L (ref 39–117)
ALT: 36 IU/L (ref 0–44)
AST: 23 IU/L (ref 0–40)
Albumin: 4.4 g/dL (ref 3.5–5.5)
Bilirubin Total: 0.3 mg/dL (ref 0.0–1.2)
Bilirubin, Direct: 0.13 mg/dL (ref 0.00–0.40)
TOTAL PROTEIN: 7.3 g/dL (ref 6.0–8.5)

## 2016-06-01 LAB — PSA: Prostate Specific Ag, Serum: 1.1 ng/mL (ref 0.0–4.0)

## 2016-06-06 ENCOUNTER — Ambulatory Visit: Payer: Managed Care, Other (non HMO) | Admitting: Family Medicine

## 2016-06-10 ENCOUNTER — Encounter: Payer: Self-pay | Admitting: Family Medicine

## 2016-06-10 ENCOUNTER — Ambulatory Visit (INDEPENDENT_AMBULATORY_CARE_PROVIDER_SITE_OTHER): Payer: Managed Care, Other (non HMO) | Admitting: Family Medicine

## 2016-06-10 VITALS — BP 134/84 | Ht 73.5 in | Wt 279.0 lb

## 2016-06-10 DIAGNOSIS — E669 Obesity, unspecified: Secondary | ICD-10-CM

## 2016-06-10 DIAGNOSIS — E119 Type 2 diabetes mellitus without complications: Secondary | ICD-10-CM

## 2016-06-10 DIAGNOSIS — E785 Hyperlipidemia, unspecified: Secondary | ICD-10-CM | POA: Diagnosis not present

## 2016-06-10 DIAGNOSIS — IMO0001 Reserved for inherently not codable concepts without codable children: Secondary | ICD-10-CM

## 2016-06-10 DIAGNOSIS — M170 Bilateral primary osteoarthritis of knee: Secondary | ICD-10-CM | POA: Diagnosis not present

## 2016-06-10 DIAGNOSIS — I10 Essential (primary) hypertension: Secondary | ICD-10-CM

## 2016-06-10 DIAGNOSIS — Z6836 Body mass index (BMI) 36.0-36.9, adult: Secondary | ICD-10-CM | POA: Diagnosis not present

## 2016-06-10 DIAGNOSIS — E038 Other specified hypothyroidism: Secondary | ICD-10-CM

## 2016-06-10 MED ORDER — LEVOTHYROXINE SODIUM 200 MCG PO TABS: ORAL_TABLET | ORAL | 6 refills | Status: DC

## 2016-06-10 MED ORDER — METFORMIN HCL 1000 MG PO TABS: 1000.0000 mg | ORAL_TABLET | Freq: Two times a day (BID) | ORAL | 1 refills | Status: DC

## 2016-06-10 MED ORDER — ATORVASTATIN CALCIUM 80 MG PO TABS: 80.0000 mg | ORAL_TABLET | Freq: Every day | ORAL | 6 refills | Status: DC

## 2016-06-10 MED ORDER — LOSARTAN POTASSIUM 100 MG PO TABS: 100.0000 mg | ORAL_TABLET | Freq: Every day | ORAL | 6 refills | Status: DC

## 2016-06-10 NOTE — Progress Notes (Addendum)
   Subjective:    Patient ID: Alexander Banks, male    DOB: [birth year], 61 y.o.   MRN: [id number]  Hypertension  This is a chronic problem. The current episode started more than 1 year ago. Pertinent negatives include no chest pain. Risk factors for coronary artery disease include male gender. Treatments tried: losartan.   Patient takes his thyroid medicine diabetes mellitus and blood pressure medicine as directed. Takes cholesterol medicine as well. Denies chest tightness pressure pain shortness breath nausea vomiting diarrhea fever chills sweats. Patient states appetite is doing okay physical activity could be better. Patient also states that he does not watch what he eats his closely as he should. Had diabetic eye exam earlier this year was normal.  Patient relates unable to do regular physical activity to build his overall health up. Does stay physically active on the property he has and does some walking with his job.  Patient states he would benefit from seen the diabetic educator/dietitian Review of Systems  Constitutional: Negative for activity change, appetite change and fatigue.  HENT: Negative for congestion.   Respiratory: Negative for cough.   Cardiovascular: Negative for chest pain.  Gastrointestinal: Negative for abdominal pain.  Endocrine: Negative for polydipsia and polyphagia.  Neurological: Negative for weakness.  Psychiatric/Behavioral: Negative for confusion.       Objective:   Physical Exam  Constitutional: He appears well-nourished. No distress.  Cardiovascular: Normal rate, regular rhythm and normal heart sounds.   No murmur heard. Pulmonary/Chest: Effort normal and breath sounds normal. No respiratory distress.  Musculoskeletal: He exhibits no edema.  Lymphadenopathy:    He has no cervical adenopathy.  Neurological: He is alert.  Psychiatric: His behavior is normal.  Vitals reviewed.  Patient also has arthritis in both knees takes medication denies any  rectal bleeding with it.   Patient was counseled to quit smoking    Assessment & Plan:  HTN fair control watch diet try lose weight continue medication  Hyperlipidemia previous labs reviewed continue current medication recheck labs again in 6 months time  Renal insufficiency repeat metabolic 7 in approximately 3 months watch diet stay physically active  Diabetes will refer him to diabetic educator Diabetes subpar control increase metformin to thousand milligrams twice a day recheck met 7 as well as urine ACR and A1c in 3 months time Patient will let us know if this medicine does not agree with him  Osteoarthritis in the knees may use anti-inflammatory currently if renal insufficiency gets worse may need to go away from this  Hypothyroidism continue current medication previous labs reviewed with patient.

## 2016-07-09 ENCOUNTER — Ambulatory Visit: Payer: Self-pay | Admitting: Nutrition

## 2016-07-10 ENCOUNTER — Encounter: Payer: Self-pay | Admitting: Nutrition

## 2016-07-10 ENCOUNTER — Encounter: Payer: Managed Care, Other (non HMO) | Attending: Family Medicine | Admitting: Nutrition

## 2016-07-10 VITALS — Ht 75.0 in | Wt 277.0 lb

## 2016-07-10 DIAGNOSIS — E119 Type 2 diabetes mellitus without complications: Secondary | ICD-10-CM | POA: Diagnosis present

## 2016-07-10 DIAGNOSIS — IMO0002 Reserved for concepts with insufficient information to code with codable children: Secondary | ICD-10-CM

## 2016-07-10 DIAGNOSIS — E782 Mixed hyperlipidemia: Secondary | ICD-10-CM

## 2016-07-10 DIAGNOSIS — Z713 Dietary counseling and surveillance: Secondary | ICD-10-CM | POA: Diagnosis not present

## 2016-07-10 DIAGNOSIS — E1165 Type 2 diabetes mellitus with hyperglycemia: Secondary | ICD-10-CM

## 2016-07-10 DIAGNOSIS — E669 Obesity, unspecified: Secondary | ICD-10-CM

## 2016-07-10 DIAGNOSIS — E118 Type 2 diabetes mellitus with unspecified complications: Secondary | ICD-10-CM

## 2016-07-10 NOTE — Progress Notes (Signed)
Diabetes Self-Management Education  Visit Type: First/Initial  Appt. Start Time: 1330 Appt. End Time: 1500  07/10/2016  [name], identified by name and date of birth, is a 61 y.o. male with a diagnosis of Diabetes: Type 2.  Here with his wife. A1C 8%. Sees DR. Luking. Not testing blood sugars; doesn't have a meter and never has tested blood sugars. Works from home. Metformin 1000 mg BID.  Lab Results  Component Value Date   HGBA1C 8.0 (H) 05/31/2016    Lipid Panel     Component Value Date/Time   CHOL 161 05/31/2016 0816   TRIG 278 (H) 05/31/2016 0816   HDL 35 (L) 05/31/2016 0816   CHOLHDL 4.6 05/31/2016 0816   CHOLHDL 3.5 05/31/2013 1020   VLDL 23 05/31/2013 1020   LDLCALC 70 05/31/2016 0816   BMET    Component Value Date/Time   NA 139 05/31/2016 0816   K 5.1 05/31/2016 0816   CL 101 05/31/2016 0816   CO2 22 05/31/2016 0816   GLUCOSE 189 (H) 05/31/2016 0816   GLUCOSE 98 05/31/2013 1020   BUN 20 05/31/2016 0816   CREATININE 1.37 (H) 05/31/2016 0816   CREATININE 1.10 05/31/2013 1020   CALCIUM 9.2 05/31/2016 0816   GFRNONAA 56 (L) 05/31/2016 0816   GFRAA 65 05/31/2016 0816     ASSESSMENT  Height 6\' 3"  (1.905 m), weight 277 lb (125.6 kg). Body mass index is 34.62 kg/m.      Diabetes Self-Management Education - 07/10/16 1339      Visit Information   Visit Type First/Initial     Initial Visit   Diabetes Type Type 2   Are you currently following a meal plan? No   Are you taking your medications as prescribed? Yes   Date Diagnosed 2015     Health Coping   How would you rate your overall health? Good     Psychosocial Assessment   Patient Belief/Attitude about Diabetes Motivated to manage diabetes   Self-care barriers None   Self-management support Doctor's office   Other persons present Patient   Patient Concerns Weight Control;Nutrition/Meal planning   Special Needs None   Preferred Learning Style No preference indicated   Learning Readiness  Ready   How often do you need to have someone help you when you read instructions, pamphlets, or other written materials from your doctor or pharmacy? 1 - Never   What is the last grade level you completed in school? 1`6     Pre-Education Assessment   Patient understands the diabetes disease and treatment process. Needs Instruction   Patient understands incorporating nutritional management into lifestyle. Needs Instruction   Patient undertands incorporating physical activity into lifestyle. Needs Instruction   Patient understands using medications safely. Needs Instruction   Patient understands monitoring blood glucose, interpreting and using results Needs Instruction   Patient understands prevention, detection, and treatment of acute complications. Needs Instruction   Patient understands prevention, detection, and treatment of chronic complications. Needs Instruction   Patient understands how to develop strategies to address psychosocial issues. Needs Instruction   Patient understands how to develop strategies to promote health/change behavior. Needs Instruction     Complications   Last HgB A1C per patient/outside source 8 %   How often do you check your blood sugar? 0 times/day (not testing)     Dietary Intake   Breakfast skippsskipss,  6 0zjuice, flavored water   Snack (morning) cliff bar or granola bar, water   Lunch Hamburger with bun,  apple pie, Diet coke   Snack (afternoon) none; snack handufl of nuts or craackers   Dinner venicsion tenderoloind, mashed potatoes, applesauce, green peas, water   Snack (evening) ice cream 1 cup   Beverage(s) water, flavored wwater or diet soda     Exercise   Exercise Type ADL's     Patient Education   Disease state  Definition of diabetes, type 1 and 2, and the diagnosis of diabetes;Factors that contribute to the development of diabetes;Explored patient's options for treatment of their diabetes   Nutrition management  Role of diet in the  treatment of diabetes and the relationship between the three main macronutrients and blood glucose level;Food label reading, portion sizes and measuring food.;Carbohydrate counting;Meal timing in regards to the patients' current diabetes medication.;Reviewed blood glucose goals for pre and post meals and how to evaluate the patients' food intake on their blood glucose level.;Information on hints to eating out and maintain blood glucose control.;Meal options for control of blood glucose level and chronic complications.   Physical activity and exercise  Role of exercise on diabetes management, blood pressure control and cardiac health.;Identified with patient nutritional and/or medication changes necessary with exercise.;Helped patient identify appropriate exercises in relation to his/her diabetes, diabetes complications and other health issue.   Medications Taught/reviewed insulin injection, site rotation, insulin storage and needle disposal.;Reviewed patients medication for diabetes, action, purpose, timing of dose and side effects.;Reviewed medication adjustment guidelines for hyperglycemia and sick days.   Monitoring Taught/evaluated SMBG meter.;Purpose and frequency of SMBG.;Taught/discussed recording of test results and interpretation of SMBG.;Identified appropriate SMBG and/or A1C goals.;Interpreting lab values - A1C, lipid, urine microalbumina.;Daily foot exams   Acute complications Trained/discussed glucagon administration to patient and designated other.;Taught treatment of hypoglycemia - the 15 rule.;Discussed and identified patients' treatment of hyperglycemia.;Covered sick day management with medication and food.   Chronic complications Relationship between chronic complications and blood glucose control;Assessed and discussed foot care and prevention of foot problems;Lipid levels, blood glucose control and heart disease;Identified and discussed with patient  current chronic complications;Retinopathy  and reason for yearly dilated eye exams;Nephropathy, what it is, prevention of, the use of ACE, ARB's and early detection of through urine microalbumia.;Reviewed with patient heart disease, higher risk of, and prevention   Psychosocial adjustment Worked with patient to identify barriers to care and solutions;Role of stress on diabetes;Helped patient identify a support system for diabetes management;Travel strategies;Identified and addressed patients feelings and concerns about diabetes;Brainstormed with patient on coping mechanisms for social situations, getting support from significant others, dealing with feelings about diabetes   Personal strategies to promote health Lifestyle issues that need to be addressed for better diabetes care;Review risk of smoking and offered smoking cessation;Helped patient develop diabetes management plan for (enter comment)     Individualized Goals (developed by patient)   Nutrition Follow meal plan discussed;General guidelines for healthy choices and portions discussed;Adjust meds/carbs with exercise as discussed   Physical Activity Exercise 3-5 times per week;30 minutes per day   Medications take my medication as prescribed   Monitoring  test my blood glucose as discussed;test blood glucose pre and post meals as discussed   Reducing Risk examine blood glucose patterns;stop smoking;get labs drawn;do foot checks daily;treat hypoglycemia with 15 grams of carbs if blood glucose less than 70mg /dL;increase portions of nuts and seeds;increase portions of healthy fats     Post-Education Assessment   Patient understands the diabetes disease and treatment process. Needs Review   Patient understands incorporating nutritional management into lifestyle. Needs Review  Patient undertands incorporating physical activity into lifestyle. Needs Review     Outcomes   Expected Outcomes Demonstrated interest in learning. Expect positive outcomes   Future DMSE 4-6 wks   Program Status  Completed    Blood Glucose Monitoring Instruction  Appointment Start Time: [id number]  Appointment End Time: 1500  Assessment:  Primary concerns today: Patient here for instruction on Blood Glucose Monitoring. They  do not have their own meter at this time.  Meter Provided:   Yes  If Yes, Brand: Verio Flex Lot #: [id number] Expiration Date: 04/2017  Medications: Metformin 1000 mg BID     Intervention:    Explained rationale of testing BG to obtain data as to how their diabetes is being managed.  Provided Target Ranges for both pre and post meals  Explained factors that effect BG including food (carbohydrate), stress, activity level and insulin availability in the body including diabetes medications  Taught patient techniques for using BG monitor and lancing device  Discussed need for Rx for strips and lancets   Explained rationale of recording BG both for patient and MD to assess patterns as needed.  Follow Up: Patient offered follow up as needed.   Individualized Plan for Diabetes Self-Management Training:   Learning Objective:  Patient will have a greater understanding of diabetes self-management. Patient education plan is to attend individual and/or group sessions per assessed needs and concerns.   Plan:   Patient Instructions  Goals 1. Follow Plate Method 2. Eat meals on time 3. Measure foods out for portion control 4. Drink only water 5. Cut out snacks 6. Eat 3-4 carb choices per meal 7. Eat higher fiber foods 8. Test blood sugar before breakfast and bedtime and record On log sheets and bring to all visits. Get A1C to less than 7% Lose 1-2 lb per week Stop smoking.   Expected Outcomes:  Demonstrated interest in learning. Expect positive outcomes  Education material provided: Living Well with Diabetes, Food label handouts, A1C conversion sheet, Meal plan card, My Plate and Carbohydrate counting sheet  If problems or questions, patient to contact team via:   Phone and Email  Future DSME appointment: 4-6 wks   May need to monitor kidney function with Creat 1.37 mg/dl and GFR < 60 and being on Metformin 1000 mg BID.    Would recommend a SGLT2 for improved blood sugars and needed weight loss.

## 2016-07-10 NOTE — Patient Instructions (Signed)
Goals 1. Follow Plate Method 2. Eat meals on time 3. Measure foods out for portion control 4. Drink only water 5. Cut out snacks 6. Eat 3-4 carb choices per meal 7. Eat higher fiber foods 8. Test blood sugar before breakfast and bedtime and record On log sheets and bring to all visits. Get A1C to less than 7% Lose 1-2 lb per week Stop smoking.

## 2016-07-15 ENCOUNTER — Telehealth: Payer: Self-pay | Admitting: Family Medicine

## 2016-07-15 NOTE — Telephone Encounter (Signed)
Requesting Rx for supplies for a One Touch Verio Flex.  He was given this meter by the nutritionist we referred him to. He is requesting 1 month supply to [facility] in [place], but is requesting 3 month supply sent to [facility].

## 2016-07-15 NOTE — Telephone Encounter (Signed)
Patient notified scripts will be faxed to pharmacy today.

## 2016-07-16 ENCOUNTER — Telehealth: Payer: Self-pay | Admitting: Family Medicine

## 2016-07-16 NOTE — Telephone Encounter (Signed)
Patient requested Rx for supplies for his one touch verio flex yesterday, but the insurance is needing it to be changed to be written for testing 3 times daily.  [place]

## 2016-07-16 NOTE — Telephone Encounter (Signed)
Patient is only on metformin- no insulin so it was written for once a day testing

## 2016-07-16 NOTE — Telephone Encounter (Signed)
He may have a prescription called in to state testing 3 times daily due to poorly controlled diabetes. The patient should be aware it is possible that this will not be approved by his insurance company because he is not currently on insulin. But we can certainly try

## 2016-07-17 NOTE — Telephone Encounter (Signed)
New script faxed to pharmacy.

## 2016-07-17 NOTE — Telephone Encounter (Signed)
Left message on voicemail notifying patient  

## 2016-08-08 ENCOUNTER — Ambulatory Visit: Payer: Managed Care, Other (non HMO) | Admitting: Nutrition

## 2016-08-20 ENCOUNTER — Ambulatory Visit: Payer: Managed Care, Other (non HMO) | Admitting: Nutrition

## 2016-08-21 ENCOUNTER — Encounter: Payer: Managed Care, Other (non HMO) | Attending: Family Medicine | Admitting: Nutrition

## 2016-08-21 VITALS — Wt 270.0 lb

## 2016-08-21 DIAGNOSIS — Z713 Dietary counseling and surveillance: Secondary | ICD-10-CM | POA: Insufficient documentation

## 2016-08-21 DIAGNOSIS — E119 Type 2 diabetes mellitus without complications: Secondary | ICD-10-CM | POA: Insufficient documentation

## 2016-08-21 DIAGNOSIS — E669 Obesity, unspecified: Secondary | ICD-10-CM

## 2016-08-21 DIAGNOSIS — Z6833 Body mass index (BMI) 33.0-33.9, adult: Secondary | ICD-10-CM | POA: Insufficient documentation

## 2016-08-21 NOTE — Patient Instructions (Addendum)
Goals 1. Follow My Plate 2. Eat 45-60 carbs per meals 3. Increase fiber rich foods 4. Lose 5-6 lbs in the next month or so. Get A1C to 7% or less

## 2016-08-21 NOTE — Progress Notes (Signed)
Diabetes Self-Management Education  Visit Type:    Appt. Start Time: 1330 Appt. End Time: 1500  08/21/2016  [name], identified by name and date of birth, is a 61 y.o. male with a diagnosis of Diabetes:  Marland Kitchen  Here with his wife. A1C 8%. Sees DR. Luking.  Working on portion sizes, being more mindful about eating three meals per day. Feeling satisfied. Lost 7 lbs since last month. Lost total of 11 lbs in the past year. Has cut out fast food and eating much better balanced meals with more fresh fruits, vegetables and whole grains. Drinking water. Limited mobility with knee problems. 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 Down loaded meter for the last month and avg BS 122 mg/dl, which is near 6% A1C. Last A1C was 8% and he will get his lab work done the end of this month for Dr. Wolfgang Phoenix.. Taking his Metformin 1000 mg BID as prescribed. Not overly hungry and stays satisfied most of the the time. No low blood sugars.    He is making great progress with BS control and weight loss. He wants to lose another 20-30 lbs.  Expect his lipid profile to improve as well. Lab Results  Component Value Date   HGBA1C 8.0 (H) 05/31/2016    Lipid Panel     Component Value Date/Time   CHOL 161 05/31/2016 0816   TRIG 278 (H) 05/31/2016 0816   HDL 35 (L) 05/31/2016 0816   CHOLHDL 4.6 05/31/2016 0816   CHOLHDL 3.5 05/31/2013 1020   VLDL 23 05/31/2013 1020   LDLCALC 70 05/31/2016 0816   BMET    Component Value Date/Time   NA 139 05/31/2016 0816   K 5.1 05/31/2016 0816   CL 101 05/31/2016 0816   CO2 22 05/31/2016 0816   GLUCOSE 189 (H) 05/31/2016 0816   GLUCOSE 98 05/31/2013 1020   BUN 20 05/31/2016 0816   CREATININE 1.37 (H) 05/31/2016 0816   CREATININE 1.10 05/31/2013 1020   CALCIUM 9.2 05/31/2016 0816   GFRNONAA 56 (L) 05/31/2016 0816   GFRAA 65 05/31/2016 0816     ASSESSMENT  Weight 270 lb (122.5 kg). Body mass index is 33.75  kg/m.   Medications: Metformin 1000 mg BID     Intervention:    Explained rationale of testing BG to obtain data as to how their diabetes is being managed.  Provided Target Ranges for both pre and post meals  Explained factors that effect BG including food (carbohydrate), stress, activity level and insulin availability in the body including diabetes medications  Taught patient techniques for using BG monitor and lancing device  Discussed need for Rx for strips and lancets   Explained rationale of recording BG both for patient and MD to assess patterns as needed.  Follow Up: Patient offered follow up as needed.   Individualized Plan for Diabetes Self-Management Training:   Learning Objective:  Patient will have a greater understanding of diabetes self-management. Patient education plan is to attend individual and/or group sessions per assessed needs and concerns.   Plan:  Goals 1. Follow My Plate 2. Eat 45-60 carbs per meals 3. Increase fiber rich foods 4. Lose 5-6 lbs in the next month or so. Get A1C to 7% or less  Expected Outcomes:     Education material provided: Living Well with Diabetes, Food label handouts, A1C conversion sheet, Meal plan card, My Plate and Carbohydrate counting sheet  If problems  or questions, patient to contact team via:  Phone and Email  Future DSME appointment:    3 months.

## 2016-09-11 LAB — BASIC METABOLIC PANEL
BUN/Creatinine Ratio: 15 (ref 9–20)
BUN: 18 mg/dL (ref 6–24)
CHLORIDE: 102 mmol/L (ref 96–106)
CO2: 25 mmol/L (ref 18–29)
CREATININE: 1.19 mg/dL (ref 0.76–1.27)
Calcium: 9.5 mg/dL (ref 8.7–10.2)
GFR calc Af Amer: 77 mL/min/{1.73_m2} (ref >59)
GFR calc non Af Amer: 66 mL/min/{1.73_m2} (ref >59)
GLUCOSE: 106 mg/dL — AB (ref 65–99)
Potassium: 5.3 mmol/L — ABNORMAL HIGH (ref 3.5–5.2)
Sodium: 143 mmol/L (ref 134–144)

## 2016-09-11 LAB — HEMOGLOBIN A1C
ESTIMATED AVERAGE GLUCOSE: 131 mg/dL
HEMOGLOBIN A1C: 6.2 % — AB (ref 4.8–5.6)

## 2016-09-11 LAB — MICROALBUMIN / CREATININE URINE RATIO
CREATININE, UR: 87.7 mg/dL
MICROALBUM., U, RANDOM: 29.6 ug/mL
Microalb/Creat Ratio: 33.8 mg/g creat — ABNORMAL HIGH (ref 0.0–30.0)

## 2016-09-17 ENCOUNTER — Other Ambulatory Visit: Payer: Self-pay | Admitting: Family Medicine

## 2016-09-17 ENCOUNTER — Telehealth: Payer: Self-pay | Admitting: Family Medicine

## 2016-09-17 NOTE — Telephone Encounter (Signed)
Patient is requesting Rx for his diabetic test strips to [facility].

## 2016-09-17 NOTE — Telephone Encounter (Signed)
Prescription faxed to pharmacy.

## 2016-09-17 NOTE — Telephone Encounter (Signed)
Patient states uses One touch verio flex meter. Test once per day.

## 2016-09-17 NOTE — Telephone Encounter (Signed)
Please give prescription for this may have one year refills

## 2016-10-24 ENCOUNTER — Telehealth: Payer: Self-pay | Admitting: Family Medicine

## 2016-10-24 DIAGNOSIS — Z79899 Other long term (current) drug therapy: Secondary | ICD-10-CM

## 2016-10-24 DIAGNOSIS — E785 Hyperlipidemia, unspecified: Secondary | ICD-10-CM

## 2016-10-24 DIAGNOSIS — I10 Essential (primary) hypertension: Secondary | ICD-10-CM

## 2016-10-24 DIAGNOSIS — E039 Hypothyroidism, unspecified: Secondary | ICD-10-CM

## 2016-10-24 DIAGNOSIS — E119 Type 2 diabetes mellitus without complications: Secondary | ICD-10-CM

## 2016-10-24 NOTE — Telephone Encounter (Signed)
Requesting order for blood work for appointment on 12/13/16 with Dr. Nicki Reaper.

## 2016-10-24 NOTE — Telephone Encounter (Signed)
09/10/16 labs: HgbA1c, met 7 and urine micro protien 05/2016 labs : Lipid, liver, met 7, tsh, psa, HgbA1c

## 2016-10-27 NOTE — Telephone Encounter (Signed)
Metabolic 7, lipid, liver, TSH, free T4, A1c

## 2016-10-28 NOTE — Telephone Encounter (Signed)
Orders put in and pt notified.  

## 2016-11-27 ENCOUNTER — Ambulatory Visit: Payer: Managed Care, Other (non HMO) | Admitting: Nutrition

## 2016-12-10 LAB — HEMOGLOBIN A1C
ESTIMATED AVERAGE GLUCOSE: 128 mg/dL
HEMOGLOBIN A1C: 6.1 % — AB (ref 4.8–5.6)

## 2016-12-10 LAB — HEPATIC FUNCTION PANEL
ALK PHOS: 79 IU/L (ref 39–117)
ALT: 22 IU/L (ref 0–44)
AST: 23 IU/L (ref 0–40)
Albumin: 4.5 g/dL (ref 3.5–5.5)
BILIRUBIN TOTAL: 0.6 mg/dL (ref 0.0–1.2)
BILIRUBIN, DIRECT: 0.18 mg/dL (ref 0.00–0.40)
TOTAL PROTEIN: 7.3 g/dL (ref 6.0–8.5)

## 2016-12-10 LAB — LIPID PANEL
CHOL/HDL RATIO: 4 ratio (ref 0.0–5.0)
Cholesterol, Total: 141 mg/dL (ref 100–199)
HDL: 35 mg/dL — AB (ref >39)
LDL Calculated: 64 mg/dL (ref 0–99)
TRIGLYCERIDES: 209 mg/dL — AB (ref 0–149)
VLDL Cholesterol Cal: 42 mg/dL — ABNORMAL HIGH (ref 5–40)

## 2016-12-10 LAB — BASIC METABOLIC PANEL
BUN / CREAT RATIO: 18 (ref 9–20)
BUN: 21 mg/dL (ref 6–24)
CHLORIDE: 106 mmol/L (ref 96–106)
CO2: 23 mmol/L (ref 20–29)
Calcium: 9.6 mg/dL (ref 8.7–10.2)
Creatinine, Ser: 1.2 mg/dL (ref 0.76–1.27)
GFR calc Af Amer: 76 mL/min/{1.73_m2} (ref >59)
GFR calc non Af Amer: 66 mL/min/{1.73_m2} (ref >59)
GLUCOSE: 107 mg/dL — AB (ref 65–99)
Potassium: 5.4 mmol/L — ABNORMAL HIGH (ref 3.5–5.2)
SODIUM: 144 mmol/L (ref 134–144)

## 2016-12-10 LAB — T4, FREE: Free T4: 1.65 ng/dL (ref 0.82–1.77)

## 2016-12-10 LAB — TSH: TSH: 0.608 u[IU]/mL (ref 0.450–4.500)

## 2016-12-13 ENCOUNTER — Encounter: Payer: Self-pay | Admitting: Family Medicine

## 2016-12-13 ENCOUNTER — Ambulatory Visit (INDEPENDENT_AMBULATORY_CARE_PROVIDER_SITE_OTHER): Payer: Managed Care, Other (non HMO) | Admitting: Family Medicine

## 2016-12-13 VITALS — BP 132/76 | Ht 73.5 in | Wt 257.0 lb

## 2016-12-13 DIAGNOSIS — E119 Type 2 diabetes mellitus without complications: Secondary | ICD-10-CM

## 2016-12-13 DIAGNOSIS — E785 Hyperlipidemia, unspecified: Secondary | ICD-10-CM | POA: Diagnosis not present

## 2016-12-13 DIAGNOSIS — E039 Hypothyroidism, unspecified: Secondary | ICD-10-CM

## 2016-12-13 NOTE — Progress Notes (Signed)
   Subjective:    Patient ID: Alexander Banks, male    DOB: [birth year], 61 y.o.   MRN: [id number]  Diabetes  He presents for his follow-up diabetic visit. He has type 2 diabetes mellitus. Pertinent negatives for hypoglycemia include no confusion. Pertinent negatives for diabetes include no chest pain, no fatigue, no polydipsia, no polyphagia and no weakness. He has had a previous visit with a dietitian. He does not see a podiatrist.Eye exam is current.   States he eats healthy and does not exercise.Patient does have moderate obesity he also has osteoarthritis of the knees he has lost some weight by watching out eats  He does smoke a few small cigars per day is been counseled to quit does not see himself quitting anytime soon  Denies any heart disease. Is trying to eat healthy and watch his portions  No other concerns.  Review of Systems  Constitutional: Negative for activity change, appetite change and fatigue.  HENT: Negative for congestion.   Respiratory: Negative for cough.   Cardiovascular: Negative for chest pain.  Gastrointestinal: Negative for abdominal pain.  Endocrine: Negative for polydipsia and polyphagia.  Neurological: Negative for weakness.  Psychiatric/Behavioral: Negative for confusion.       Objective:   Physical Exam  Constitutional: He appears well-nourished. No distress.  Cardiovascular: Normal rate, regular rhythm and normal heart sounds.   No murmur heard. Pulmonary/Chest: Effort normal and breath sounds normal. No respiratory distress.  Musculoskeletal: He exhibits no edema.  Lymphadenopathy:    He has no cervical adenopathy.  Neurological: He is alert.  Psychiatric: His behavior is normal.  Vitals reviewed.         Assessment & Plan:  HTN good control continue losartan Hyperlipidemia labs reviewed continue Lipitor Diabetes very good improvement continue current measures Arthritis of the knees continue current measures Thyroid good control  continue current measures Medication check up and wellness in 6 months

## 2017-01-04 ENCOUNTER — Other Ambulatory Visit: Payer: Self-pay | Admitting: Family Medicine

## 2017-01-10 ENCOUNTER — Other Ambulatory Visit: Payer: Self-pay | Admitting: Family Medicine

## 2017-04-16 ENCOUNTER — Other Ambulatory Visit: Payer: Self-pay | Admitting: Family Medicine

## 2017-04-17 NOTE — Telephone Encounter (Signed)
May have 90-day prescription on all of these may have 1 year refill on testing strips, may have additional 90-day refill on his anti-inflammatory

## 2017-06-16 ENCOUNTER — Encounter: Payer: Managed Care, Other (non HMO) | Admitting: Family Medicine

## 2017-07-11 ENCOUNTER — Other Ambulatory Visit: Payer: Self-pay | Admitting: *Deleted

## 2017-07-11 ENCOUNTER — Other Ambulatory Visit: Payer: Self-pay | Admitting: Family Medicine

## 2017-07-11 MED ORDER — LEVOTHYROXINE SODIUM 200 MCG PO TABS: ORAL_TABLET | ORAL | 0 refills | Status: DC

## 2017-07-11 MED ORDER — LOSARTAN POTASSIUM 100 MG PO TABS: ORAL_TABLET | ORAL | 0 refills | Status: DC

## 2017-07-11 MED ORDER — ATORVASTATIN CALCIUM 80 MG PO TABS: ORAL_TABLET | ORAL | 0 refills | Status: DC

## 2017-07-16 ENCOUNTER — Telehealth: Payer: Self-pay | Admitting: Family Medicine

## 2017-07-16 ENCOUNTER — Other Ambulatory Visit: Payer: Self-pay | Admitting: Family Medicine

## 2017-07-16 DIAGNOSIS — E785 Hyperlipidemia, unspecified: Secondary | ICD-10-CM

## 2017-07-16 DIAGNOSIS — Z1159 Encounter for screening for other viral diseases: Secondary | ICD-10-CM

## 2017-07-16 DIAGNOSIS — I10 Essential (primary) hypertension: Secondary | ICD-10-CM

## 2017-07-16 DIAGNOSIS — Z125 Encounter for screening for malignant neoplasm of prostate: Secondary | ICD-10-CM

## 2017-07-16 DIAGNOSIS — Z114 Encounter for screening for human immunodeficiency virus [HIV]: Secondary | ICD-10-CM

## 2017-07-16 DIAGNOSIS — E119 Type 2 diabetes mellitus without complications: Secondary | ICD-10-CM

## 2017-07-16 NOTE — Telephone Encounter (Signed)
Lipid, liver, metabolic 7, urine ACR, TSH, A1c, HIV antibody, hepatitis C antibody, PSA- hyperlipidemia hypertension diabetes-as well as screening for prostate cancer plus also standard to do one-time HIV and hepatitis C screening

## 2017-07-16 NOTE — Telephone Encounter (Signed)
Patient has an appointment on 07/21/17 with Dr. Nicki Reaper.  He is requesting orders for labs to have done tomorrow morning.

## 2017-07-16 NOTE — Telephone Encounter (Signed)
Orders placed and pt is aware. °

## 2017-07-16 NOTE — Telephone Encounter (Signed)
Had Bmet,a1c,t4,tsh,micro albumin,hepatic fx panel,lipid done 12/09/2016.Same? Please advise.

## 2017-07-18 LAB — PSA: PROSTATE SPECIFIC AG, SERUM: 0.7 ng/mL (ref 0.0–4.0)

## 2017-07-18 LAB — BASIC METABOLIC PANEL
BUN / CREAT RATIO: 17 (ref 9–20)
BUN: 20 mg/dL (ref 6–24)
CALCIUM: 9.4 mg/dL (ref 8.7–10.2)
CHLORIDE: 105 mmol/L (ref 96–106)
CO2: 23 mmol/L (ref 20–29)
CREATININE: 1.16 mg/dL (ref 0.76–1.27)
GFR, EST AFRICAN AMERICAN: 79 mL/min/{1.73_m2} (ref >59)
GFR, EST NON AFRICAN AMERICAN: 69 mL/min/{1.73_m2} (ref >59)
Glucose: 123 mg/dL — ABNORMAL HIGH (ref 65–99)
Potassium: 5.2 mmol/L (ref 3.5–5.2)
Sodium: 142 mmol/L (ref 134–144)

## 2017-07-18 LAB — HIV ANTIBODY (ROUTINE TESTING W REFLEX): HIV SCREEN 4TH GENERATION: NONREACTIVE

## 2017-07-18 LAB — HEPATITIS C ANTIBODY: HEP C VIRUS AB: 0.2 {s_co_ratio} (ref 0.0–0.9)

## 2017-07-18 LAB — LIPID PANEL
CHOL/HDL RATIO: 3.6 ratio (ref 0.0–5.0)
CHOLESTEROL TOTAL: 132 mg/dL (ref 100–199)
HDL: 37 mg/dL — ABNORMAL LOW (ref >39)
LDL CALC: 61 mg/dL (ref 0–99)
Triglycerides: 168 mg/dL — ABNORMAL HIGH (ref 0–149)
VLDL CHOLESTEROL CAL: 34 mg/dL (ref 5–40)

## 2017-07-18 LAB — HEPATIC FUNCTION PANEL
ALK PHOS: 80 IU/L (ref 39–117)
ALT: 20 IU/L (ref 0–44)
AST: 17 IU/L (ref 0–40)
Albumin: 4.7 g/dL (ref 3.5–5.5)
BILIRUBIN TOTAL: 0.4 mg/dL (ref 0.0–1.2)
BILIRUBIN, DIRECT: 0.13 mg/dL (ref 0.00–0.40)
Total Protein: 7.5 g/dL (ref 6.0–8.5)

## 2017-07-18 LAB — MICROALBUMIN / CREATININE URINE RATIO
CREATININE, UR: 94.4 mg/dL
MICROALB/CREAT RATIO: 68.6 mg/g{creat} — AB (ref 0.0–30.0)
MICROALBUM., U, RANDOM: 64.8 ug/mL

## 2017-07-18 LAB — TSH: TSH: 0.344 u[IU]/mL — ABNORMAL LOW (ref 0.450–4.500)

## 2017-07-18 LAB — HEMOGLOBIN A1C
Est. average glucose Bld gHb Est-mCnc: 131 mg/dL
Hgb A1c MFr Bld: 6.2 % — ABNORMAL HIGH (ref 4.8–5.6)

## 2017-07-21 ENCOUNTER — Encounter: Payer: Self-pay | Admitting: Family Medicine

## 2017-07-21 ENCOUNTER — Encounter: Payer: Self-pay | Admitting: *Deleted

## 2017-07-21 ENCOUNTER — Telehealth: Payer: Self-pay | Admitting: Family Medicine

## 2017-07-21 ENCOUNTER — Ambulatory Visit: Payer: Managed Care, Other (non HMO) | Admitting: Family Medicine

## 2017-07-21 VITALS — BP 130/86 | Temp 99.1°F | Ht 73.5 in | Wt 250.0 lb

## 2017-07-21 DIAGNOSIS — Z1211 Encounter for screening for malignant neoplasm of colon: Secondary | ICD-10-CM

## 2017-07-21 DIAGNOSIS — E038 Other specified hypothyroidism: Secondary | ICD-10-CM

## 2017-07-21 DIAGNOSIS — E7849 Other hyperlipidemia: Secondary | ICD-10-CM

## 2017-07-21 DIAGNOSIS — Z0001 Encounter for general adult medical examination with abnormal findings: Secondary | ICD-10-CM

## 2017-07-21 DIAGNOSIS — I10 Essential (primary) hypertension: Secondary | ICD-10-CM

## 2017-07-21 DIAGNOSIS — R7303 Prediabetes: Secondary | ICD-10-CM

## 2017-07-21 DIAGNOSIS — J069 Acute upper respiratory infection, unspecified: Secondary | ICD-10-CM | POA: Diagnosis not present

## 2017-07-21 DIAGNOSIS — Z0000 Encounter for general adult medical examination without abnormal findings: Secondary | ICD-10-CM

## 2017-07-21 MED ORDER — METFORMIN HCL 1000 MG PO TABS: 1000.0000 mg | ORAL_TABLET | Freq: Two times a day (BID) | ORAL | 1 refills | Status: DC

## 2017-07-21 MED ORDER — LOSARTAN POTASSIUM 100 MG PO TABS: ORAL_TABLET | ORAL | 5 refills | Status: DC

## 2017-07-21 MED ORDER — LEVOTHYROXINE SODIUM 200 MCG PO TABS: ORAL_TABLET | ORAL | 5 refills | Status: DC

## 2017-07-21 MED ORDER — DICLOFENAC SODIUM 75 MG PO TBEC: 75.0000 mg | DELAYED_RELEASE_TABLET | Freq: Two times a day (BID) | ORAL | 1 refills | Status: DC

## 2017-07-21 MED ORDER — ATORVASTATIN CALCIUM 80 MG PO TABS: ORAL_TABLET | ORAL | 5 refills | Status: DC

## 2017-07-21 NOTE — Telephone Encounter (Signed)
Patient seen Dr. Nicki Reaper today.  He wants to know when he is due again for labs and an office visit.  He would like a nurse to reach him through [place].

## 2017-07-21 NOTE — Patient Instructions (Signed)
DASH Eating Plan DASH stands for "Dietary Approaches to Stop Hypertension." The DASH eating plan is a healthy eating plan that has been shown to reduce high blood pressure (hypertension). It may also reduce your risk for type 2 diabetes, heart disease, and stroke. The DASH eating plan may also help with weight loss. What are tips for following this plan? General guidelines  Avoid eating more than 2,300 mg (milligrams) of salt (sodium) a day. If you have hypertension, you may need to reduce your sodium intake to 1,500 mg a day.  Limit alcohol intake to no more than 1 drink a day for nonpregnant women and 2 drinks a day for men. One drink equals 12 oz of beer, 5 oz of wine, or 1 oz of hard liquor.  Work with your health care provider to maintain a healthy body weight or to lose weight. Ask what an ideal weight is for you.  Get at least 30 minutes of exercise that causes your heart to beat faster (aerobic exercise) most days of the week. Activities may include walking, swimming, or biking.  Work with your health care provider or diet and nutrition specialist (dietitian) to adjust your eating plan to your individual calorie needs. Reading food labels  Check food labels for the amount of sodium per serving. Choose foods with less than 5 percent of the Daily Value of sodium. Generally, foods with less than 300 mg of sodium per serving fit into this eating plan.  To find whole grains, look for the word "whole" as the first word in the ingredient list. Shopping  Buy products labeled as "low-sodium" or "no salt added."  Buy fresh foods. Avoid canned foods and premade or frozen meals. Cooking  Avoid adding salt when cooking. Use salt-free seasonings or herbs instead of table salt or sea salt. Check with your health care provider or pharmacist before using salt substitutes.  Do not fry foods. Cook foods using healthy methods such as baking, boiling, grilling, and broiling instead.  Cook with  heart-healthy oils, such as olive, canola, soybean, or sunflower oil. Meal planning   Eat a balanced diet that includes: ? 5 or more servings of fruits and vegetables each day. At each meal, try to fill half of your plate with fruits and vegetables. ? Up to 6-8 servings of whole grains each day. ? Less than 6 oz of lean meat, poultry, or fish each day. A 3-oz serving of meat is about the same size as a deck of cards. One egg equals 1 oz. ? 2 servings of low-fat dairy each day. ? A serving of nuts, seeds, or beans 5 times each week. ? Heart-healthy fats. Healthy fats called Omega-3 fatty acids are found in foods such as flaxseeds and coldwater fish, like sardines, salmon, and mackerel.  Limit how much you eat of the following: ? Canned or prepackaged foods. ? Food that is high in trans fat, such as fried foods. ? Food that is high in saturated fat, such as fatty meat. ? Sweets, desserts, sugary drinks, and other foods with added sugar. ? Full-fat dairy products.  Do not salt foods before eating.  Try to eat at least 2 vegetarian meals each week.  Eat more home-cooked food and less restaurant, buffet, and fast food.  When eating at a restaurant, ask that your food be prepared with less salt or no salt, if possible. What foods are recommended? The items listed may not be a complete list. Talk with your dietitian about what   dietary choices are best for you. Grains Whole-grain or whole-wheat bread. Whole-grain or whole-wheat pasta. Brown rice. Oatmeal. Quinoa. Bulgur. Whole-grain and low-sodium cereals. Pita bread. Low-fat, low-sodium crackers. Whole-wheat flour tortillas. Vegetables Fresh or frozen vegetables (raw, steamed, roasted, or grilled). Low-sodium or reduced-sodium tomato and vegetable juice. Low-sodium or reduced-sodium tomato sauce and tomato paste. Low-sodium or reduced-sodium canned vegetables. Fruits All fresh, dried, or frozen fruit. Canned fruit in natural juice (without  added sugar). Meat and other protein foods Skinless chicken or turkey. Ground chicken or turkey. Pork with fat trimmed off. Fish and seafood. Egg whites. Dried beans, peas, or lentils. Unsalted nuts, nut butters, and seeds. Unsalted canned beans. Lean cuts of beef with fat trimmed off. Low-sodium, lean deli meat. Dairy Low-fat (1%) or fat-free (skim) milk. Fat-free, low-fat, or reduced-fat cheeses. Nonfat, low-sodium ricotta or cottage cheese. Low-fat or nonfat yogurt. Low-fat, low-sodium cheese. Fats and oils Soft margarine without trans fats. Vegetable oil. Low-fat, reduced-fat, or light mayonnaise and salad dressings (reduced-sodium). Canola, safflower, olive, soybean, and sunflower oils. Avocado. Seasoning and other foods Herbs. Spices. Seasoning mixes without salt. Unsalted popcorn and pretzels. Fat-free sweets. What foods are not recommended? The items listed may not be a complete list. Talk with your dietitian about what dietary choices are best for you. Grains Baked goods made with fat, such as croissants, muffins, or some breads. Dry pasta or rice meal packs. Vegetables Creamed or fried vegetables. Vegetables in a cheese sauce. Regular canned vegetables (not low-sodium or reduced-sodium). Regular canned tomato sauce and paste (not low-sodium or reduced-sodium). Regular tomato and vegetable juice (not low-sodium or reduced-sodium). Pickles. Olives. Fruits Canned fruit in a light or heavy syrup. Fried fruit. Fruit in cream or butter sauce. Meat and other protein foods Fatty cuts of meat. Ribs. Fried meat. Bacon. Sausage. Bologna and other processed lunch meats. Salami. Fatback. Hotdogs. Bratwurst. Salted nuts and seeds. Canned beans with added salt. Canned or smoked fish. Whole eggs or egg yolks. Chicken or turkey with skin. Dairy Whole or 2% milk, cream, and half-and-half. Whole or full-fat cream cheese. Whole-fat or sweetened yogurt. Full-fat cheese. Nondairy creamers. Whipped toppings.  Processed cheese and cheese spreads. Fats and oils Butter. Stick margarine. Lard. Shortening. Ghee. Bacon fat. Tropical oils, such as coconut, palm kernel, or palm oil. Seasoning and other foods Salted popcorn and pretzels. Onion salt, garlic salt, seasoned salt, table salt, and sea salt. Worcestershire sauce. Tartar sauce. Barbecue sauce. Teriyaki sauce. Soy sauce, including reduced-sodium. Steak sauce. Canned and packaged gravies. Fish sauce. Oyster sauce. Cocktail sauce. Horseradish that you find on the shelf. Ketchup. Mustard. Meat flavorings and tenderizers. Bouillon cubes. Hot sauce and Tabasco sauce. Premade or packaged marinades. Premade or packaged taco seasonings. Relishes. Regular salad dressings. Where to find more information:  National Heart, Lung, and Blood Institute: www.nhlbi.nih.gov  American Heart Association: www.heart.org Summary  The DASH eating plan is a healthy eating plan that has been shown to reduce high blood pressure (hypertension). It may also reduce your risk for type 2 diabetes, heart disease, and stroke.  With the DASH eating plan, you should limit salt (sodium) intake to 2,300 mg a day. If you have hypertension, you may need to reduce your sodium intake to 1,500 mg a day.  When on the DASH eating plan, aim to eat more fresh fruits and vegetables, whole grains, lean proteins, low-fat dairy, and heart-healthy fats.  Work with your health care provider or diet and nutrition specialist (dietitian) to adjust your eating plan to your individual   calorie needs. This information is not intended to replace advice given to you by your health care provider. Make sure you discuss any questions you have with your health care provider. Document Released: 04/25/2011 Document Revised: 04/29/2016 Document Reviewed: 04/29/2016 Elsevier Interactive Patient Education  2018 Elsevier Inc.   Steps to Quit Smoking Smoking tobacco can be bad for your health. It can also affect  almost every organ in your body. Smoking puts you and people around you at risk for many serious long-lasting (chronic) diseases. Quitting smoking is hard, but it is one of the best things that you can do for your health. It is never too late to quit. What are the benefits of quitting smoking? When you quit smoking, you lower your risk for getting serious diseases and conditions. They can include:  Lung cancer or lung disease.  Heart disease.  Stroke.  Heart attack.  Not being able to have children (infertility).  Weak bones (osteoporosis) and broken bones (fractures).  If you have coughing, wheezing, and shortness of breath, those symptoms may get better when you quit. You may also get sick less often. If you are pregnant, quitting smoking can help to lower your chances of having a baby of low birth weight. What can I do to help me quit smoking? Talk with your doctor about what can help you quit smoking. Some things you can do (strategies) include:  Quitting smoking totally, instead of slowly cutting back how much you smoke over a period of time.  Going to in-person counseling. You are more likely to quit if you go to many counseling sessions.  Using resources and support systems, such as: ? Online chats with a counselor. ? Phone quitlines. ? Printed self-help materials. ? Support groups or group counseling. ? Text messaging programs. ? Mobile phone apps or applications.  Taking medicines. Some of these medicines may have nicotine in them. If you are pregnant or breastfeeding, do not take any medicines to quit smoking unless your doctor says it is okay. Talk with your doctor about counseling or other things that can help you.  Talk with your doctor about using more than one strategy at the same time, such as taking medicines while you are also going to in-person counseling. This can help make quitting easier. What things can I do to make it easier to quit? Quitting smoking might  feel very hard at first, but there is a lot that you can do to make it easier. Take these steps:  Talk to your family and friends. Ask them to support and encourage you.  Call phone quitlines, reach out to support groups, or work with a counselor.  Ask people who smoke to not smoke around you.  Avoid places that make you want (trigger) to smoke, such as: ? Bars. ? Parties. ? Smoke-break areas at work.  Spend time with people who do not smoke.  Lower the stress in your life. Stress can make you want to smoke. Try these things to help your stress: ? Getting regular exercise. ? Deep-breathing exercises. ? Yoga. ? Meditating. ? Doing a body scan. To do this, close your eyes, focus on one area of your body at a time from head to toe, and notice which parts of your body are tense. Try to relax the muscles in those areas.  Download or buy apps on your mobile phone or tablet that can help you stick to your quit plan. There are many free apps, such as QuitGuide from the   for Disease Control and Prevention). You can find more support from smokefree.gov and other websites.  This information is not intended to replace advice given to you by your health care provider. Make sure you discuss any questions you have with your health care provider. Document Released: 03/02/2009 Document Revised: 01/02/2016 Document Reviewed: 09/20/2014 Elsevier Interactive Patient Education  2018 [employer].

## 2017-07-21 NOTE — Telephone Encounter (Signed)
Also please initiate referral in system for screening colonoscopy in [place]  (rec'd CC chart message from Dr. Nicki Reaper but no referral in system)
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
## 2017-07-21 NOTE — Telephone Encounter (Signed)
I sent a post note from today's visit to the nurses pull so the patient does a hemoglobin A1c lipid liver metabolic 7 and TSH before next visit in 6 months, also reduce levothyroxine 200 mcg use a half a tablet on Monday then 1 tablet on all other days please change the directions in epic thank you

## 2017-07-21 NOTE — Telephone Encounter (Signed)
Blood work ordered in [facility] and referral ordered in [facility] [employer]. Responded to patient via My Chart as requested.

## 2017-07-21 NOTE — Progress Notes (Signed)
Blood work ordered in [employer] and referral ordered in [employer]. Responded to patient via My Chart as requested.

## 2017-07-22 ENCOUNTER — Other Ambulatory Visit: Payer: Self-pay | Admitting: Family Medicine

## 2017-07-24 ENCOUNTER — Encounter: Payer: Self-pay | Admitting: Family Medicine

## 2017-07-28 ENCOUNTER — Encounter: Payer: Self-pay | Admitting: Gastroenterology

## 2017-08-08 ENCOUNTER — Other Ambulatory Visit: Payer: Self-pay | Admitting: Family Medicine

## 2017-09-25 ENCOUNTER — Ambulatory Visit (AMBULATORY_SURGERY_CENTER): Payer: Self-pay

## 2017-09-25 VITALS — Ht 75.0 in | Wt 252.8 lb

## 2017-09-25 DIAGNOSIS — Z1211 Encounter for screening for malignant neoplasm of colon: Secondary | ICD-10-CM

## 2017-09-25 MED ORDER — NA SULFATE-K SULFATE-MG SULF 17.5-3.13-1.6 GM/177ML PO SOLN: 1.0000 | Freq: Once | ORAL | 0 refills | Status: AC

## 2017-09-25 NOTE — Progress Notes (Signed)
Per pt, no allergies to soy or egg products.Pt not taking any weight loss meds or using  O2 at home.  Pt refused emmi video. 

## 2017-09-29 ENCOUNTER — Encounter: Payer: Self-pay | Admitting: Gastroenterology

## 2017-10-09 ENCOUNTER — Other Ambulatory Visit: Payer: Self-pay

## 2017-10-09 ENCOUNTER — Ambulatory Visit (AMBULATORY_SURGERY_CENTER): Payer: Managed Care, Other (non HMO) | Admitting: Gastroenterology

## 2017-10-09 ENCOUNTER — Encounter: Payer: Self-pay | Admitting: Gastroenterology

## 2017-10-09 VITALS — BP 116/80 | HR 84 | Temp 98.4°F | Resp 16 | Ht 75.0 in | Wt 252.0 lb

## 2017-10-09 DIAGNOSIS — Z1211 Encounter for screening for malignant neoplasm of colon: Secondary | ICD-10-CM | POA: Diagnosis not present

## 2017-10-09 MED ORDER — SODIUM CHLORIDE 0.9 % IV SOLN: 500.0000 mL | Freq: Once | INTRAVENOUS | Status: DC

## 2017-10-09 NOTE — Progress Notes (Signed)
Report given to PACU, vss 

## 2017-10-09 NOTE — Progress Notes (Signed)
Pt's states no medical or surgical changes since previsit or office visit. 

## 2017-10-09 NOTE — Patient Instructions (Signed)
YOU HAD AN ENDOSCOPIC PROCEDURE TODAY AT [facility]:   Refer to the procedure report that was given to you for any specific questions about what was found during the examination.  If the procedure report does not answer your questions, please call your gastroenterologist to clarify.  If you requested that your care partner not be given the details of your procedure findings, then the procedure report has been included in a sealed envelope for you to review at your convenience later.  YOU SHOULD EXPECT: Some feelings of bloating in the abdomen. Passage of more gas than usual.  Walking can help get rid of the air that was put into your GI tract during the procedure and reduce the bloating. If you had a lower endoscopy (such as a colonoscopy or flexible sigmoidoscopy) you may notice spotting of blood in your stool or on the toilet paper. If you underwent a bowel prep for your procedure, you may not have a normal bowel movement for a few days.  Please Note:  You might notice some irritation and congestion in your nose or some drainage.  This is from the oxygen used during your procedure.  There is no need for concern and it should clear up in a day or so.  SYMPTOMS TO REPORT IMMEDIATELY:   Following lower endoscopy (colonoscopy or flexible sigmoidoscopy):  Excessive amounts of blood in the stool  Significant tenderness or worsening of abdominal pains  Swelling of the abdomen that is new, acute  Fever of 100F or higher   For urgent or emergent issues, a gastroenterologist can be reached at any hour by calling [phone].   DIET:  We do recommend a small meal at first, but then you may proceed to your regular diet.  Drink plenty of fluids but you should avoid alcoholic beverages for 24 hours.  ACTIVITY:  You should plan to take it easy for the rest of today and you should NOT DRIVE or use heavy machinery until tomorrow (because of the sedation medicines used during the test).     FOLLOW UP: Our staff will call the number listed on your records the next business day following your procedure to check on you and address any questions or concerns that you may have regarding the information given to you following your procedure. If we do not reach you, we will leave a message.  However, if you are feeling well and you are not experiencing any problems, there is no need to return our call.  We will assume that you have returned to your regular daily activities without incident.  If any biopsies were taken you will be contacted by phone or by letter within the next 1-3 weeks.  Please call us at [phone] if you have not heard about the biopsies in 3 weeks.    SIGNATURES/CONFIDENTIALITY: You and/or your care partner have signed paperwork which will be entered into your electronic medical record.  These signatures attest to the fact that that the information above on your After Visit Summary has been reviewed and is understood.  Full responsibility of the confidentiality of this discharge information lies with you and/or your care-partner.  We will see you again in 10 years. Thanks  For choosing [place] for your healthcare needs today.

## 2017-10-09 NOTE — Op Note (Signed)
[place] Patient Name: Alexander Banks Procedure Date: 10/09/2017 8:08 AM MRN: [id number] Endoscopist: Auxier. Loletha Banks , MD Age: 62 Referring MD:  Date of Birth: May 05, 1956 Gender: Male Account #: [id number] Procedure:                Colonoscopy Indications:              Screening for colorectal malignant neoplasm                            (patient reports normal colonoscopy 10 years prior) Medicines:                Monitored Anesthesia Care Procedure:                Pre-Anesthesia Assessment:                           - Prior to the procedure, a History and Physical                            was performed, and patient medications and                            allergies were reviewed. The patient's tolerance of                            previous anesthesia was also reviewed. The risks                            and benefits of the procedure and the sedation                            options and risks were discussed with the patient.                            All questions were answered, and informed consent                            was obtained. Prior Anticoagulants: The patient has                            taken no previous anticoagulant or antiplatelet                            agents. ASA Grade Assessment: II - A patient with                            mild systemic disease. After reviewing the risks                            and benefits, the patient was deemed in                            satisfactory condition to undergo the procedure.  After obtaining informed consent, the colonoscope                            was passed under direct vision. Throughout the                            procedure, the patient's blood pressure, pulse, and                            oxygen saturations were monitored continuously. The                            Colonoscope was introduced through the anus and                            advanced to the  the cecum, identified by                            appendiceal orifice and ileocecal valve. The                            colonoscopy was performed without difficulty. The                            patient tolerated the procedure well. The quality                            of the bowel preparation was good. The ileocecal                            valve, appendiceal orifice, and rectum were                            photographed. The quality of the bowel preparation                            was evaluated using the BBPS Alexander Banks Bowel                            Preparation Scale) with scores of: Right Colon = 2,                            Transverse Colon = 2 and Left Colon = 2. The total                            BBPS score equals 6. Scope In: 8:09:43 AM Scope Out: 8:23:02 AM Scope Withdrawal Time: 0 hours 9 minutes 34 seconds  Total Procedure Duration: 0 hours 13 minutes 19 seconds  Findings:                 The perianal and digital rectal examinations were                            normal.  Multiple diverticula were found in the sigmoid                            colon.                           The exam was otherwise without abnormality on                            direct and retroflexion views. Complications:            No immediate complications. Estimated Blood Loss:     Estimated blood loss: none. Impression:               - Diverticulosis in the sigmoid colon.                           - The examination was otherwise normal on direct                            and retroflexion views.                           - No specimens collected. Recommendation:           - Patient has a contact number available for                            emergencies. The signs and symptoms of potential                            delayed complications were discussed with the                            patient. Return to normal activities tomorrow.                             Written discharge instructions were provided to the                            patient.                           - Resume previous diet.                           - Continue present medications.                           - Repeat colonoscopy in 10 years for screening                            purposes. Alexander Mihalko L. Loletha Carrow, MD 10/09/2017 8:30:08 AM This report has been signed electronically.

## 2017-10-10 ENCOUNTER — Telehealth: Payer: Self-pay | Admitting: *Deleted

## 2017-10-10 NOTE — Telephone Encounter (Signed)
   First call attempt. Left message on voicemail.

## 2017-10-10 NOTE — Telephone Encounter (Signed)
Second attempt follow-up phone call.  No answer.

## 2018-01-10 ENCOUNTER — Other Ambulatory Visit: Payer: Self-pay | Admitting: Family Medicine

## 2018-02-09 ENCOUNTER — Other Ambulatory Visit: Payer: Self-pay | Admitting: Family Medicine

## 2018-02-17 ENCOUNTER — Telehealth: Payer: Self-pay | Admitting: Family Medicine

## 2018-02-17 NOTE — Telephone Encounter (Signed)
This patient is currently in [place] He will need a new prescription for his CPAP machine His current CPAP machine broke When the patient calls on Wednesday please have the nurse speak with the patient at that time  please gather all necessary information so that we can send a prescription to the home health supplier in [place]  Patient does have sleep apnea Certainly useful information would be what pressure setting does he usually have it on  FYI-this message is being sent to the front and the nurses so that both are aware of this patient needs when they call on Weds

## 2018-02-18 NOTE — Telephone Encounter (Signed)
When pt calls please send call to brendale. She needs a lot of info and she states she will talk with him when he calls.

## 2018-02-24 NOTE — Telephone Encounter (Signed)
Have not talked to pt, to my knowledge he's not called back

## 2018-03-21 ENCOUNTER — Other Ambulatory Visit: Payer: Self-pay | Admitting: Family Medicine

## 2018-03-24 NOTE — Telephone Encounter (Signed)
May have 1 month Needs OV  Needs to do lipid, TSH, A1C to monitor hypothyroid/hyperlip/Prediabetes

## 2018-03-25 ENCOUNTER — Other Ambulatory Visit: Payer: Self-pay | Admitting: Family Medicine

## 2018-03-25 DIAGNOSIS — E038 Other specified hypothyroidism: Secondary | ICD-10-CM

## 2018-03-25 DIAGNOSIS — R7303 Prediabetes: Secondary | ICD-10-CM

## 2018-03-25 DIAGNOSIS — E7849 Other hyperlipidemia: Secondary | ICD-10-CM

## 2018-03-25 NOTE — Telephone Encounter (Signed)
Pt returned call; informed patient that labs need to be done and he needs an OV. Pt verbalized understanding and was transferred to front to set up appt.

## 2018-03-25 NOTE — Telephone Encounter (Signed)
Labs placed;  Medication sent in. Left message to return call

## 2018-04-09 LAB — LIPID PANEL
Chol/HDL Ratio: 3.9 ratio (ref 0.0–5.0)
Cholesterol, Total: 147 mg/dL (ref 100–199)
HDL: 38 mg/dL — ABNORMAL LOW (ref >39)
LDL Calculated: 74 mg/dL (ref 0–99)
TRIGLYCERIDES: 175 mg/dL — AB (ref 0–149)
VLDL Cholesterol Cal: 35 mg/dL (ref 5–40)

## 2018-04-09 LAB — HEMOGLOBIN A1C
ESTIMATED AVERAGE GLUCOSE: 128 mg/dL
Hgb A1c MFr Bld: 6.1 % — ABNORMAL HIGH (ref 4.8–5.6)

## 2018-04-09 LAB — TSH: TSH: 1.21 u[IU]/mL (ref 0.450–4.500)

## 2018-04-15 ENCOUNTER — Ambulatory Visit: Payer: Managed Care, Other (non HMO) | Admitting: Family Medicine

## 2018-04-15 ENCOUNTER — Encounter: Payer: Self-pay | Admitting: Family Medicine

## 2018-04-15 ENCOUNTER — Other Ambulatory Visit: Payer: Self-pay

## 2018-04-15 VITALS — BP 134/80 | Ht 73.5 in | Wt 251.0 lb

## 2018-04-15 DIAGNOSIS — I10 Essential (primary) hypertension: Secondary | ICD-10-CM | POA: Diagnosis not present

## 2018-04-15 DIAGNOSIS — Z125 Encounter for screening for malignant neoplasm of prostate: Secondary | ICD-10-CM

## 2018-04-15 DIAGNOSIS — Z23 Encounter for immunization: Secondary | ICD-10-CM | POA: Diagnosis not present

## 2018-04-15 DIAGNOSIS — R7303 Prediabetes: Secondary | ICD-10-CM

## 2018-04-15 DIAGNOSIS — E038 Other specified hypothyroidism: Secondary | ICD-10-CM

## 2018-04-15 DIAGNOSIS — E7849 Other hyperlipidemia: Secondary | ICD-10-CM

## 2018-04-15 DIAGNOSIS — R5383 Other fatigue: Secondary | ICD-10-CM

## 2018-04-15 MED ORDER — ATORVASTATIN CALCIUM 80 MG PO TABS: ORAL_TABLET | ORAL | 5 refills | Status: DC

## 2018-04-15 MED ORDER — LEVOTHYROXINE SODIUM 200 MCG PO TABS: ORAL_TABLET | ORAL | 5 refills | Status: DC

## 2018-04-15 MED ORDER — METFORMIN HCL 1000 MG PO TABS: 1000.0000 mg | ORAL_TABLET | Freq: Two times a day (BID) | ORAL | 1 refills | Status: DC

## 2018-04-15 MED ORDER — LOSARTAN POTASSIUM 100 MG PO TABS: 100.0000 mg | ORAL_TABLET | Freq: Every day | ORAL | 5 refills | Status: DC

## 2018-04-15 MED ORDER — DICLOFENAC SODIUM 75 MG PO TBEC: 75.0000 mg | DELAYED_RELEASE_TABLET | Freq: Two times a day (BID) | ORAL | 1 refills | Status: DC

## 2018-04-15 NOTE — Progress Notes (Signed)
   Subjective:    Patient ID: Alexander Banks, male    DOB: [birth year], 62 y.o.   MRN: [id number]  HPI  Patient is here today to follow up on chronic illnesses. He has a history of Dm his last A1c was done 04/08/2018 at 6.1. He is taking metformin 1000 mg one po Bid.  Patient working very hard trying to be healthier He is eating healthier staying more active He has lost some weight States his numbers been looking much better Denies chest tightness pressure pain or shortness of breath He does take his medications on a regular basis We did review over his medications He states he eats healthy most of the time.He does gets some exercise. He does not see any specialists.  Review of Systems  Constitutional: Negative for diaphoresis and fatigue.  HENT: Negative for congestion and rhinorrhea.   Respiratory: Negative for cough and shortness of breath.   Cardiovascular: Negative for chest pain and leg swelling.  Gastrointestinal: Negative for abdominal pain and diarrhea.  Skin: Negative for color change and rash.  Neurological: Negative for dizziness and headaches.  Psychiatric/Behavioral: Negative for behavioral problems and confusion.       Objective:   Physical Exam  Constitutional: He appears well-nourished. No distress.  HENT:  Head: Normocephalic and atraumatic.  Eyes: Right eye exhibits no discharge. Left eye exhibits no discharge.  Neck: No tracheal deviation present.  Cardiovascular: Normal rate, regular rhythm and normal heart sounds.  No murmur heard. Pulmonary/Chest: Effort normal and breath sounds normal. No respiratory distress.  Musculoskeletal: He exhibits no edema.  Lymphadenopathy:    He has no cervical adenopathy.  Neurological: He is alert. Coordination normal.  Skin: Skin is warm and dry.  Psychiatric: He has a normal mood and affect. His behavior is normal.  Vitals reviewed.   Patient was encouraged to quit smoking      Assessment & Plan:  Diabetes  good control doing good job with diet continue medication stay active continue try to lose weight  Blood pressure very good control watch salt in diet stay active  Hyperlipidemia lab work looks good continue current medication  Thyroid overall good control continue current measures  Follow-up for wellness in the spring with lab work

## 2018-07-20 ENCOUNTER — Telehealth: Payer: Self-pay | Admitting: Family Medicine

## 2018-07-20 NOTE — Telephone Encounter (Signed)
Last labs 04/15/2018 PSA, Bmet, Lipid, A1C, Urine ACR, TSH. Please advise. Thank you

## 2018-07-20 NOTE — Telephone Encounter (Signed)
Pt has CPE scheduled on 08/18/2018. He would like to have lab work done before appt.

## 2018-07-21 NOTE — Telephone Encounter (Signed)
All of the lab work that was ordered at the end of November should be still active Therefore he should be able to go to [facility] and let them know that they have orders on file from the end of November for a complete panel of blood work and then he can get this done before his wellness check plus also his check of his chronic health issues thanks  Obviously if they are having problems with figuring this out let me know

## 2018-07-22 NOTE — Telephone Encounter (Signed)
Patient notified and verbalized understanding. 

## 2018-08-03 LAB — HM DIABETES EYE EXAM

## 2018-08-14 ENCOUNTER — Encounter: Payer: Self-pay | Admitting: Family Medicine

## 2018-08-14 LAB — HM DIABETES EYE EXAM

## 2018-08-18 ENCOUNTER — Encounter: Payer: Managed Care, Other (non HMO) | Admitting: Family Medicine

## 2018-10-08 LAB — LIPID PANEL
Chol/HDL Ratio: 3.8 ratio (ref 0.0–5.0)
Cholesterol, Total: 128 mg/dL (ref 100–199)
HDL: 34 mg/dL — AB (ref >39)
LDL Calculated: 60 mg/dL (ref 0–99)
TRIGLYCERIDES: 172 mg/dL — AB (ref 0–149)
VLDL Cholesterol Cal: 34 mg/dL (ref 5–40)

## 2018-10-08 LAB — BASIC METABOLIC PANEL
BUN / CREAT RATIO: 15 (ref 10–24)
BUN: 18 mg/dL (ref 8–27)
CALCIUM: 9.4 mg/dL (ref 8.6–10.2)
CO2: 21 mmol/L (ref 20–29)
CREATININE: 1.22 mg/dL (ref 0.76–1.27)
Chloride: 106 mmol/L (ref 96–106)
GFR calc non Af Amer: 64 mL/min/{1.73_m2} (ref >59)
GFR, EST AFRICAN AMERICAN: 74 mL/min/{1.73_m2} (ref >59)
GLUCOSE: 120 mg/dL — AB (ref 65–99)
Potassium: 5.1 mmol/L (ref 3.5–5.2)
Sodium: 140 mmol/L (ref 134–144)

## 2018-10-08 LAB — HEMOGLOBIN A1C
ESTIMATED AVERAGE GLUCOSE: 128 mg/dL
HEMOGLOBIN A1C: 6.1 % — AB (ref 4.8–5.6)

## 2018-10-08 LAB — MICROALBUMIN / CREATININE URINE RATIO
Creatinine, Urine: 102.5 mg/dL
MICROALB/CREAT RATIO: 38 mg/g{creat} — AB (ref 0–29)
Microalbumin, Urine: 39.4 ug/mL

## 2018-10-08 LAB — PSA: PROSTATE SPECIFIC AG, SERUM: 1.5 ng/mL (ref 0.0–4.0)

## 2018-10-08 LAB — TSH: TSH: 0.225 u[IU]/mL — ABNORMAL LOW (ref 0.450–4.500)

## 2018-10-12 ENCOUNTER — Other Ambulatory Visit: Payer: Self-pay | Admitting: Family Medicine

## 2018-10-14 NOTE — Telephone Encounter (Signed)
Patient has physical in June 3 refills each

## 2018-10-21 ENCOUNTER — Ambulatory Visit (INDEPENDENT_AMBULATORY_CARE_PROVIDER_SITE_OTHER): Payer: Managed Care, Other (non HMO) | Admitting: Family Medicine

## 2018-10-21 ENCOUNTER — Other Ambulatory Visit: Payer: Self-pay

## 2018-10-21 ENCOUNTER — Encounter: Payer: Self-pay | Admitting: Family Medicine

## 2018-10-21 VITALS — BP 134/90 | Temp 97.6°F | Ht 72.5 in | Wt 245.0 lb

## 2018-10-21 DIAGNOSIS — E038 Other specified hypothyroidism: Secondary | ICD-10-CM | POA: Diagnosis not present

## 2018-10-21 DIAGNOSIS — R7303 Prediabetes: Secondary | ICD-10-CM

## 2018-10-21 DIAGNOSIS — E7849 Other hyperlipidemia: Secondary | ICD-10-CM | POA: Diagnosis not present

## 2018-10-21 DIAGNOSIS — I10 Essential (primary) hypertension: Secondary | ICD-10-CM

## 2018-10-21 DIAGNOSIS — Z0000 Encounter for general adult medical examination without abnormal findings: Secondary | ICD-10-CM

## 2018-10-21 DIAGNOSIS — G4733 Obstructive sleep apnea (adult) (pediatric): Secondary | ICD-10-CM

## 2018-10-21 MED ORDER — ZOSTER VAC RECOMB ADJUVANTED 50 MCG/0.5ML IM SUSR: 0.5000 mL | Freq: Once | INTRAMUSCULAR | 0 refills | Status: AC

## 2018-10-21 MED ORDER — ATORVASTATIN CALCIUM 80 MG PO TABS: ORAL_TABLET | ORAL | 1 refills | Status: DC

## 2018-10-21 MED ORDER — LOSARTAN POTASSIUM 100 MG PO TABS: 100.0000 mg | ORAL_TABLET | Freq: Every day | ORAL | 1 refills | Status: DC

## 2018-10-21 MED ORDER — METFORMIN HCL 1000 MG PO TABS: 1000.0000 mg | ORAL_TABLET | Freq: Two times a day (BID) | ORAL | 1 refills | Status: DC

## 2018-10-21 MED ORDER — DICLOFENAC SODIUM 75 MG PO TBEC: 75.0000 mg | DELAYED_RELEASE_TABLET | Freq: Two times a day (BID) | ORAL | 3 refills | Status: DC

## 2018-10-21 MED ORDER — LEVOTHYROXINE SODIUM 175 MCG PO TABS: ORAL_TABLET | ORAL | 1 refills | Status: DC

## 2018-10-21 NOTE — Progress Notes (Signed)
Message placed in the tickler for December 2020.

## 2018-10-21 NOTE — Patient Instructions (Signed)

## 2018-10-21 NOTE — Progress Notes (Signed)
Subjective:    Patient ID: Alexander Banks, male    DOB: 1956-02-23, 63 y.o.   MRN: [id number]  HPI The patient comes in today for a wellness visit.    A review of their health history was completed.  A review of medications was also completed.  Any needed refills; all meds.   Eating habits: health conscious  Falls/  MVA accidents in past few months: none  Regular exercise: daily work  Specialist pt sees on regular basis: none  Preventative health issues were discussed.   Additional concerns: stuffy/runny nose. Started 2 days ago. Not tried any treatments.   Blood pressure overall he takes his blood pressure intermittently is been under good control 124/74 range does take his medicines watch his diet  Has lost a little bit of weight but understands he needs to lose more we will watch diet try to watch portions try to stay active  Patient has thyroid condition.  Takes thyroid medication on a regular basis.  States that the proper way.  Relates compliance.  States no negative side effects.  States condition seems to be under good control.  Patient here for follow-up regarding cholesterol.  The patient does have hyperlipidemia.  Patient does try to maintain a reasonable diet.  Patient does take the medication on a regular basis.  Denies missing a dose.  The patient denies any obvious side effects.  Prior blood work results reviewed with the patient.  The patient is aware of his cholesterol goals and the need to keep it under good control to lessen the risk of disease.  The patient was seen today as part of a comprehensive diabetic check up.the patient does have diabetes.  The patient follows here on a regular basis.  The patient relates medication compliance. No significant side effects to the medications. Denies any low glucose spells. Relates compliance with diet to a reasonable level. Patient does do labwork intermittently and understands the dangers of diabetes.   Patient for  blood pressure check up.  The patient does have hypertension.  The patient is on medication.  Patient relates compliance with meds. Todays BP reviewed with the patient. Patient denies issues with medication. Patient relates reasonable diet. Patient tries to minimize salt. Patient aware of BP goals.   Review of Systems  Constitutional: Negative for activity change, appetite change and fever.  HENT: Negative for congestion and rhinorrhea.   Eyes: Negative for discharge.  Respiratory: Negative for cough and wheezing.   Cardiovascular: Negative for chest pain.  Gastrointestinal: Negative for abdominal pain, blood in stool and vomiting.  Genitourinary: Negative for difficulty urinating and frequency.  Musculoskeletal: Negative for neck pain.  Skin: Negative for rash.  Allergic/Immunologic: Negative for environmental allergies and food allergies.  Neurological: Negative for weakness and headaches.  Psychiatric/Behavioral: Negative for agitation.       Objective:   Physical Exam Constitutional:      Appearance: He is well-developed.  HENT:     Head: Normocephalic and atraumatic.     Right Ear: External ear normal.     Left Ear: External ear normal.     Nose: Nose normal.  Eyes:     Pupils: Pupils are equal, round, and reactive to light.  Neck:     Musculoskeletal: Normal range of motion and neck supple.     Thyroid: No thyromegaly.  Cardiovascular:     Rate and Rhythm: Normal rate and regular rhythm.     Heart sounds: Normal heart sounds. No murmur.  Pulmonary:     Effort: Pulmonary effort is normal. No respiratory distress.     Breath sounds: Normal breath sounds. No wheezing.  Abdominal:     General: Bowel sounds are normal. There is no distension.     Palpations: Abdomen is soft. There is no mass.     Tenderness: There is no abdominal tenderness.  Genitourinary:    Penis: Normal.      Prostate: Normal.  Musculoskeletal: Normal range of motion.  Lymphadenopathy:      Cervical: No cervical adenopathy.  Skin:    General: Skin is warm and dry.     Findings: No erythema.  Neurological:     Mental Status: He is alert.     Motor: No abnormal muscle tone.  Psychiatric:        Behavior: Behavior normal.        Judgment: Judgment normal.     Patient does use tobacco products.  Patient knows they should quit.  Patient is aware that smoking/in use of tobacco products increases their risk of heart disease, cancer, and COPD- lung issues.  Patient has been counseled to quit smoking/tobacco products.       Assessment & Plan:  Adult wellness-complete.wellness physical was conducted today. Importance of diet and exercise were discussed in detail.  In addition to this a discussion regarding safety was also covered. We also reviewed over immunizations and gave recommendations regarding current immunization needed for age.  In addition to this additional areas were also touched on including: Preventative health exams needed:  Colonoscopy 2019 10 years  Patient was advised yearly wellness exam  Blood pressure good control HTN- Patient was seen today as part of a visit regarding hypertension. The importance of healthy diet and regular physical activity was discussed. The importance of compliance with medications discussed.  Ideal goal is to keep blood pressure low elevated levels certainly below 440/10 when possible.  The patient was counseled that keeping blood pressure under control lessen his risk of complications.  The importance of regular follow-ups was discussed with the patient.  Low-salt diet such as DASH recommended.  Regular physical activity was recommended as well.  Patient was advised to keep regular follow-ups.  Thyroid medication shows with testing it needs to be adjusted will adjust dose recheck again in 6 months  Sleep apnea does well with the machine continue this  Diabetes under good control watch diet closely stay physically active keep  weight under control check lab work 6 months  The patient was seen today as part of an evaluation regarding hyperlipidemia.  Recent lab work has been reviewed with the patient as well as the goals for good cholesterol care.  In addition to this medications have been discussed the importance of compliance with diet and medications discussed as well.  Finally the patient is aware that poor control of cholesterol, noncompliance can dramatically increase the risk of complications. The patient will keep regular office visits and the patient does agreed to periodic lab work.  PSA has doubled I recommend repeating this again in approximately 6 months  Shingles vaccine recommended  Patient has head congestion related to cutting grass all over the weekend recommend OTC allergy medicines patient not exhibiting body aches headache fever chills

## 2018-10-26 ENCOUNTER — Encounter: Payer: Managed Care, Other (non HMO) | Admitting: Family Medicine

## 2018-10-29 ENCOUNTER — Other Ambulatory Visit: Payer: Self-pay | Admitting: Family Medicine

## 2019-03-08 ENCOUNTER — Telehealth: Payer: Self-pay | Admitting: Family Medicine

## 2019-03-08 DIAGNOSIS — E038 Other specified hypothyroidism: Secondary | ICD-10-CM

## 2019-03-08 DIAGNOSIS — R7303 Prediabetes: Secondary | ICD-10-CM

## 2019-03-08 DIAGNOSIS — E7849 Other hyperlipidemia: Secondary | ICD-10-CM

## 2019-03-08 DIAGNOSIS — I10 Essential (primary) hypertension: Secondary | ICD-10-CM

## 2019-03-08 NOTE — Telephone Encounter (Signed)
Last labs 10/07/18 psa, bmp, lipid, a1c, microalb, tsh

## 2019-03-08 NOTE — Telephone Encounter (Signed)
Lab orders placed. Left message on pt voicemail

## 2019-03-08 NOTE — Telephone Encounter (Signed)
Patient has follow up in November and would like labs done.

## 2019-03-08 NOTE — Telephone Encounter (Signed)
Metabolic 7, lipid, liver, TSH, A1c

## 2019-04-06 LAB — TSH: TSH: 5.94 u[IU]/mL — ABNORMAL HIGH (ref 0.450–4.500)

## 2019-04-06 LAB — BASIC METABOLIC PANEL
BUN/Creatinine Ratio: 15 (ref 10–24)
BUN: 19 mg/dL (ref 8–27)
CO2: 23 mmol/L (ref 20–29)
Calcium: 9.2 mg/dL (ref 8.6–10.2)
Chloride: 105 mmol/L (ref 96–106)
Creatinine, Ser: 1.27 mg/dL (ref 0.76–1.27)
GFR calc Af Amer: 70 mL/min/{1.73_m2} (ref >59)
GFR calc non Af Amer: 61 mL/min/{1.73_m2} (ref >59)
Glucose: 117 mg/dL — ABNORMAL HIGH (ref 65–99)
Potassium: 5.3 mmol/L — ABNORMAL HIGH (ref 3.5–5.2)
Sodium: 142 mmol/L (ref 134–144)

## 2019-04-06 LAB — HEPATIC FUNCTION PANEL
ALT: 19 IU/L (ref 0–44)
AST: 18 IU/L (ref 0–40)
Albumin: 4.4 g/dL (ref 3.8–4.8)
Alkaline Phosphatase: 79 IU/L (ref 39–117)
Bilirubin Total: 0.3 mg/dL (ref 0.0–1.2)
Bilirubin, Direct: 0.1 mg/dL (ref 0.00–0.40)
Total Protein: 7.4 g/dL (ref 6.0–8.5)

## 2019-04-06 LAB — LIPID PANEL
Chol/HDL Ratio: 4.2 ratio (ref 0.0–5.0)
Cholesterol, Total: 155 mg/dL (ref 100–199)
HDL: 37 mg/dL — ABNORMAL LOW (ref >39)
LDL Chol Calc (NIH): 86 mg/dL (ref 0–99)
Triglycerides: 188 mg/dL — ABNORMAL HIGH (ref 0–149)
VLDL Cholesterol Cal: 32 mg/dL (ref 5–40)

## 2019-04-06 LAB — HEMOGLOBIN A1C
Est. average glucose Bld gHb Est-mCnc: 131 mg/dL
Hgb A1c MFr Bld: 6.2 % — ABNORMAL HIGH (ref 4.8–5.6)

## 2019-04-09 ENCOUNTER — Ambulatory Visit (INDEPENDENT_AMBULATORY_CARE_PROVIDER_SITE_OTHER): Payer: Managed Care, Other (non HMO) | Admitting: Family Medicine

## 2019-04-09 ENCOUNTER — Encounter: Payer: Self-pay | Admitting: Family Medicine

## 2019-04-09 ENCOUNTER — Ambulatory Visit: Payer: Managed Care, Other (non HMO) | Admitting: Family Medicine

## 2019-04-09 ENCOUNTER — Other Ambulatory Visit: Payer: Self-pay

## 2019-04-09 VITALS — BP 138/70 | Temp 97.9°F | Ht 72.5 in | Wt 255.0 lb

## 2019-04-09 DIAGNOSIS — I10 Essential (primary) hypertension: Secondary | ICD-10-CM | POA: Diagnosis not present

## 2019-04-09 DIAGNOSIS — E7849 Other hyperlipidemia: Secondary | ICD-10-CM | POA: Diagnosis not present

## 2019-04-09 DIAGNOSIS — E038 Other specified hypothyroidism: Secondary | ICD-10-CM | POA: Diagnosis not present

## 2019-04-09 DIAGNOSIS — Z23 Encounter for immunization: Secondary | ICD-10-CM

## 2019-04-09 MED ORDER — LOSARTAN POTASSIUM 100 MG PO TABS: 100.0000 mg | ORAL_TABLET | Freq: Every day | ORAL | 1 refills | Status: DC

## 2019-04-09 MED ORDER — LEVOTHYROXINE SODIUM 175 MCG PO TABS: ORAL_TABLET | ORAL | 1 refills | Status: DC

## 2019-04-09 MED ORDER — DICLOFENAC SODIUM 75 MG PO TBEC: 75.0000 mg | DELAYED_RELEASE_TABLET | Freq: Two times a day (BID) | ORAL | 1 refills | Status: DC

## 2019-04-09 MED ORDER — METFORMIN HCL 1000 MG PO TABS: 1000.0000 mg | ORAL_TABLET | Freq: Two times a day (BID) | ORAL | 1 refills | Status: DC

## 2019-04-09 MED ORDER — ROSUVASTATIN CALCIUM 40 MG PO TABS: 40.0000 mg | ORAL_TABLET | Freq: Every day | ORAL | 1 refills | Status: DC

## 2019-04-09 NOTE — Patient Instructions (Signed)
Results for orders placed or performed in visit on [id number]  Basic Metabolic Panel (BMET)  Result Value Ref Range   Glucose 117 (H) 65 - 99 mg/dL   BUN 19 8 - 27 mg/dL   Creatinine, Ser 1.27 0.76 - 1.27 mg/dL   GFR calc non Af Amer 61 >59 mL/min/1.73   GFR calc Af Amer 70 >59 mL/min/1.73   BUN/Creatinine Ratio 15 10 - 24   Sodium 142 134 - 144 mmol/L   Potassium 5.3 (H) 3.5 - 5.2 mmol/L   Chloride 105 96 - 106 mmol/L   CO2 23 20 - 29 mmol/L   Calcium 9.2 8.6 - 10.2 mg/dL  Lipid Profile  Result Value Ref Range   Cholesterol, Total 155 100 - 199 mg/dL   Triglycerides 188 (H) 0 - 149 mg/dL   HDL 37 (L) >39 mg/dL   VLDL Cholesterol Cal 32 5 - 40 mg/dL   LDL Chol Calc (NIH) 86 0 - 99 mg/dL   Chol/HDL Ratio 4.2 0.0 - 5.0 ratio  Hepatic function panel  Result Value Ref Range   Total Protein 7.4 6.0 - 8.5 g/dL   Albumin 4.4 3.8 - 4.8 g/dL   Bilirubin Total 0.3 0.0 - 1.2 mg/dL   Bilirubin, Direct 0.10 0.00 - 0.40 mg/dL   Alkaline Phosphatase 79 39 - 117 IU/L   AST 18 0 - 40 IU/L   ALT 19 0 - 44 IU/L  TSH  Result Value Ref Range   TSH 5.940 (H) 0.450 - 4.500 uIU/mL  Hemoglobin A1c  Result Value Ref Range   Hgb A1c MFr Bld 6.2 (H) 4.8 - 5.6 %   Est. average glucose Bld gHb Est-mCnc 131 mg/dL

## 2019-04-09 NOTE — Progress Notes (Signed)
Subjective:    Patient ID: Alexander Banks, male    DOB: October 29, 1955, 63 y.o.   MRN: [id number]  Diabetes He presents for his follow-up diabetic visit. Pertinent negatives for hypoglycemia include no headaches. Pertinent negatives for diabetes include no chest pain and no weakness. Exercise: no regular exercise but he stays busy. Home blood sugar record trend: states blood sugar has been good. Eye exam is current.   Results for orders placed or performed in visit on [id number]  Basic Metabolic Panel (BMET)  Result Value Ref Range   Glucose 117 (H) 65 - 99 mg/dL   BUN 19 8 - 27 mg/dL   Creatinine, Ser 1.27 0.76 - 1.27 mg/dL   GFR calc non Af Amer 61 >59 mL/min/1.73   GFR calc Af Amer 70 >59 mL/min/1.73   BUN/Creatinine Ratio 15 10 - 24   Sodium 142 134 - 144 mmol/L   Potassium 5.3 (H) 3.5 - 5.2 mmol/L   Chloride 105 96 - 106 mmol/L   CO2 23 20 - 29 mmol/L   Calcium 9.2 8.6 - 10.2 mg/dL  Lipid Profile  Result Value Ref Range   Cholesterol, Total 155 100 - 199 mg/dL   Triglycerides 188 (H) 0 - 149 mg/dL   HDL 37 (L) >39 mg/dL   VLDL Cholesterol Cal 32 5 - 40 mg/dL   LDL Chol Calc (NIH) 86 0 - 99 mg/dL   Chol/HDL Ratio 4.2 0.0 - 5.0 ratio  Hepatic function panel  Result Value Ref Range   Total Protein 7.4 6.0 - 8.5 g/dL   Albumin 4.4 3.8 - 4.8 g/dL   Bilirubin Total 0.3 0.0 - 1.2 mg/dL   Bilirubin, Direct 0.10 0.00 - 0.40 mg/dL   Alkaline Phosphatase 79 39 - 117 IU/L   AST 18 0 - 40 IU/L   ALT 19 0 - 44 IU/L  TSH  Result Value Ref Range   TSH 5.940 (H) 0.450 - 4.500 uIU/mL  Hemoglobin A1c  Result Value Ref Range   Hgb A1c MFr Bld 6.2 (H) 4.8 - 5.6 %   Est. average glucose Bld gHb Est-mCnc 131 mg/dL     would like a flu vaccine. Patient overall doing well trying to watch diet try to stay active has a lot of knee trouble which makes it difficult for him to exercise lab work reviewed including elevated TSH A1c looks good continue current meds Pt states no concerns today.     Review of Systems  Constitutional: Negative for activity change, appetite change and fever.  HENT: Negative for congestion and rhinorrhea.   Eyes: Negative for discharge.  Respiratory: Negative for cough and wheezing.   Cardiovascular: Negative for chest pain.  Gastrointestinal: Negative for abdominal pain, blood in stool and vomiting.  Genitourinary: Negative for difficulty urinating and frequency.  Musculoskeletal: Negative for neck pain.  Skin: Negative for rash.  Allergic/Immunologic: Negative for environmental allergies and food allergies.  Neurological: Negative for weakness and headaches.  Psychiatric/Behavioral: Negative for agitation.       Objective:    Flu shot today CTA RR nl CV RRR no murmur Appears to be in no distress Atraumatic Neuro able to relate and oriented No apparent resp distress Color normal  Foot exam normal We did discuss Shingrix as well    Assessment & Plan:  1. Other specified hypothyroidism Adjust the medication.  Go to 1-1/2 tablet on Saturday, otherwise 1 every single day, repeat TSH in 8 to 12 weeks - TSH  2. Other  hyperlipidemia Cholesterol profile looks good watch diet stay active take medication  3. Essential hypertension, benign Potassium slightly elevated repeat recheck the potassium follow-up if progressive troubles or problems do this again in approximately 6 weeks to 8 weeks avoid potassium rich foods - Potassium  4. Need for vaccination Today - Flu Vaccine QUAD 6+ mos PF IM (Fluarix Quad PF)

## 2019-06-14 DIAGNOSIS — M25562 Pain in left knee: Secondary | ICD-10-CM | POA: Diagnosis not present

## 2019-06-14 DIAGNOSIS — M25561 Pain in right knee: Secondary | ICD-10-CM | POA: Insufficient documentation

## 2019-08-13 DIAGNOSIS — S134XXA Sprain of ligaments of cervical spine, initial encounter: Secondary | ICD-10-CM | POA: Diagnosis not present

## 2019-08-13 DIAGNOSIS — S233XXA Sprain of ligaments of thoracic spine, initial encounter: Secondary | ICD-10-CM | POA: Diagnosis not present

## 2019-08-13 DIAGNOSIS — S335XXA Sprain of ligaments of lumbar spine, initial encounter: Secondary | ICD-10-CM | POA: Diagnosis not present

## 2019-10-29 ENCOUNTER — Other Ambulatory Visit: Payer: Self-pay | Admitting: Family Medicine

## 2019-10-29 DIAGNOSIS — E7849 Other hyperlipidemia: Secondary | ICD-10-CM

## 2019-10-29 DIAGNOSIS — E038 Other specified hypothyroidism: Secondary | ICD-10-CM

## 2019-10-29 DIAGNOSIS — Z125 Encounter for screening for malignant neoplasm of prostate: Secondary | ICD-10-CM

## 2019-10-29 DIAGNOSIS — I10 Essential (primary) hypertension: Secondary | ICD-10-CM

## 2019-10-29 DIAGNOSIS — Z79899 Other long term (current) drug therapy: Secondary | ICD-10-CM

## 2019-10-29 NOTE — Telephone Encounter (Signed)
May have 1 month on all these medicines Patient needs to do wellness with follow-up on diabetes, thyroid, hyperlipidemia Patient will need PSA, lipid, TSH, metabolic 7, F0Y

## 2019-10-29 NOTE — Telephone Encounter (Signed)
CPE scheduled 7/13   Pt made aware to get lab work done a week before appt.

## 2019-11-02 ENCOUNTER — Telehealth: Payer: Self-pay | Admitting: Family Medicine

## 2019-11-02 MED ORDER — DICLOFENAC SODIUM 75 MG PO TBEC: 75.0000 mg | DELAYED_RELEASE_TABLET | Freq: Two times a day (BID) | ORAL | 1 refills | Status: DC

## 2019-11-02 NOTE — Telephone Encounter (Signed)
May have 90-day with 1 refill

## 2019-11-02 NOTE — Telephone Encounter (Signed)
Medication sent to [facility]

## 2019-11-02 NOTE — Telephone Encounter (Signed)
[facility] requesting refill on Diclofenac 75 mg. Take one tablet two times a day. Pt last seen 04/09/19 for hypothyroidism. Please advise. Thank you

## 2019-11-02 NOTE — Addendum Note (Signed)
Addended by: Vicente Males on: 11/02/2019 10:49 AM   Modules accepted: Orders

## 2019-11-25 LAB — LIPID PANEL
Chol/HDL Ratio: 3.3 ratio (ref 0.0–5.0)
Cholesterol, Total: 131 mg/dL (ref 100–199)
HDL: 40 mg/dL (ref >39)
LDL Chol Calc (NIH): 64 mg/dL (ref 0–99)
Triglycerides: 158 mg/dL — ABNORMAL HIGH (ref 0–149)
VLDL Cholesterol Cal: 27 mg/dL (ref 5–40)

## 2019-11-25 LAB — BASIC METABOLIC PANEL
BUN/Creatinine Ratio: 17 (ref 10–24)
BUN: 19 mg/dL (ref 8–27)
CO2: 21 mmol/L (ref 20–29)
Calcium: 9.7 mg/dL (ref 8.6–10.2)
Chloride: 105 mmol/L (ref 96–106)
Creatinine, Ser: 1.15 mg/dL (ref 0.76–1.27)
GFR calc Af Amer: 78 mL/min/{1.73_m2} (ref >59)
GFR calc non Af Amer: 68 mL/min/{1.73_m2} (ref >59)
Glucose: 128 mg/dL — ABNORMAL HIGH (ref 65–99)
Potassium: 5.3 mmol/L — ABNORMAL HIGH (ref 3.5–5.2)
Sodium: 141 mmol/L (ref 134–144)

## 2019-11-25 LAB — HEMOGLOBIN A1C
Est. average glucose Bld gHb Est-mCnc: 143 mg/dL
Hgb A1c MFr Bld: 6.6 % — ABNORMAL HIGH (ref 4.8–5.6)

## 2019-11-25 LAB — TSH: TSH: 1.07 u[IU]/mL (ref 0.450–4.500)

## 2019-11-25 LAB — PSA: Prostate Specific Ag, Serum: 1.4 ng/mL (ref 0.0–4.0)

## 2019-11-26 ENCOUNTER — Other Ambulatory Visit: Payer: Self-pay | Admitting: Family Medicine

## 2019-11-30 ENCOUNTER — Encounter: Payer: Self-pay | Admitting: Family Medicine

## 2019-11-30 ENCOUNTER — Other Ambulatory Visit: Payer: Self-pay

## 2019-11-30 ENCOUNTER — Ambulatory Visit (INDEPENDENT_AMBULATORY_CARE_PROVIDER_SITE_OTHER): Payer: BC Managed Care – PPO | Admitting: Family Medicine

## 2019-11-30 VITALS — BP 118/68 | Temp 97.3°F | Ht 72.5 in | Wt 239.4 lb

## 2019-11-30 DIAGNOSIS — Z0000 Encounter for general adult medical examination without abnormal findings: Secondary | ICD-10-CM | POA: Diagnosis not present

## 2019-11-30 MED ORDER — ONETOUCH VERIO VI STRP: ORAL_STRIP | 3 refills | Status: DC

## 2019-11-30 MED ORDER — LEVOTHYROXINE SODIUM 175 MCG PO TABS: ORAL_TABLET | ORAL | 1 refills | Status: DC

## 2019-11-30 MED ORDER — LOSARTAN POTASSIUM 100 MG PO TABS: 100.0000 mg | ORAL_TABLET | Freq: Every day | ORAL | 1 refills | Status: DC

## 2019-11-30 MED ORDER — METFORMIN HCL 1000 MG PO TABS: 1000.0000 mg | ORAL_TABLET | Freq: Two times a day (BID) | ORAL | 1 refills | Status: DC

## 2019-11-30 MED ORDER — ROSUVASTATIN CALCIUM 40 MG PO TABS: ORAL_TABLET | ORAL | 1 refills | Status: DC

## 2019-11-30 MED ORDER — ONETOUCH DELICA LANCETS 33G MISC: 11 refills | Status: DC

## 2019-11-30 MED ORDER — DICLOFENAC SODIUM 75 MG PO TBEC: 75.0000 mg | DELAYED_RELEASE_TABLET | Freq: Two times a day (BID) | ORAL | 1 refills | Status: DC

## 2019-11-30 NOTE — Progress Notes (Signed)
Subjective:    Patient ID: Alexander Banks, male    DOB: Jul 04, 1955, 64 y.o.   MRN: [id number]  HPI The patient comes in today for a wellness visit.    A review of their health history was completed.  A review of medications was also completed.  Any needed refills; sent all refills to [facility]; 90 day supply with possible one year supply. Make effective first of August  Eating habits: tries to be healthy  Falls/  MVA accidents in past few months: none  Regular exercise: works out pretty much all the time  Specialist pt sees on regular basis: orthopedic   Preventative health issues were discussed.   Additional concerns: none   Review of Systems  Constitutional: Negative for activity change.  HENT: Negative for congestion and rhinorrhea.   Respiratory: Negative for cough and shortness of breath.   Cardiovascular: Negative for chest pain.  Gastrointestinal: Negative for abdominal pain, diarrhea, nausea and vomiting.  Genitourinary: Negative for dysuria and hematuria.  Neurological: Negative for weakness and headaches.  Psychiatric/Behavioral: Negative for behavioral problems and confusion.       Objective:   Physical Exam Constitutional:      Appearance: He is well-developed.  HENT:     Head: Normocephalic and atraumatic.     Right Ear: External ear normal.     Left Ear: External ear normal.     Nose: Nose normal.  Eyes:     Pupils: Pupils are equal, round, and reactive to light.  Neck:     Thyroid: No thyromegaly.  Cardiovascular:     Rate and Rhythm: Normal rate and regular rhythm.     Heart sounds: Normal heart sounds. No murmur heard.   Pulmonary:     Effort: Pulmonary effort is normal. No respiratory distress.     Breath sounds: Normal breath sounds. No wheezing.  Abdominal:     General: Bowel sounds are normal. There is no distension.     Palpations: Abdomen is soft. There is no mass.     Tenderness: There is no abdominal tenderness.   Genitourinary:    Penis: Normal.   Musculoskeletal:        General: Normal range of motion.     Cervical back: Normal range of motion and neck supple.  Lymphadenopathy:     Cervical: No cervical adenopathy.  Skin:    General: Skin is warm and dry.     Findings: No erythema.  Neurological:     Mental Status: He is alert.     Motor: No abnormal muscle tone.  Psychiatric:        Behavior: Behavior normal.        Judgment: Judgment normal.    Prostate exam normal  Results for orders placed or performed in visit on 10/29/19  PSA  Result Value Ref Range   Prostate Specific Ag, Serum 1.4 0.0 - 4.0 ng/mL  Lipid panel  Result Value Ref Range   Cholesterol, Total 131 100 - 199 mg/dL   Triglycerides 158 (H) 0 - 149 mg/dL   HDL 40 >39 mg/dL   VLDL Cholesterol Cal 27 5 - 40 mg/dL   LDL Chol Calc (NIH) 64 0 - 99 mg/dL   Chol/HDL Ratio 3.3 0.0 - 5.0 ratio  TSH  Result Value Ref Range   TSH 1.070 0.450 - 4.500 uIU/mL  Basic metabolic panel  Result Value Ref Range   Glucose 128 (H) 65 - 99 mg/dL   BUN 19 8 -  27 mg/dL   Creatinine, Ser 1.15 0.76 - 1.27 mg/dL   GFR calc non Af Amer 68 >59 mL/min/1.73   GFR calc Af Amer 78 >59 mL/min/1.73   BUN/Creatinine Ratio 17 10 - 24   Sodium 141 134 - 144 mmol/L   Potassium 5.3 (H) 3.5 - 5.2 mmol/L   Chloride 105 96 - 106 mmol/L   CO2 21 20 - 29 mmol/L   Calcium 9.7 8.6 - 10.2 mg/dL  Hemoglobin A1c  Result Value Ref Range   Hgb A1c MFr Bld 6.6 (H) 4.8 - 5.6 %   Est. average glucose Bld gHb Est-mCnc 143 mg/dL   Diabetes under good control continue current measures cholesterol under good control continue current measures blood pressure good control continue current measures Potassium slightly elevated recheck this again in 5 to 6 months    Assessment & Plan:  Adult wellness-complete.wellness physical was conducted today. Importance of diet and exercise were discussed in detail.  In addition to this a discussion regarding safety was also  covered. We also reviewed over immunizations and gave recommendations regarding current immunization needed for age.  In addition to this additional areas were also touched on including: Preventative health exams needed:  Colonoscopy next one is 2029  Patient was advised yearly wellness exam

## 2020-02-07 DIAGNOSIS — M1712 Unilateral primary osteoarthritis, left knee: Secondary | ICD-10-CM | POA: Diagnosis not present

## 2020-02-07 DIAGNOSIS — M170 Bilateral primary osteoarthritis of knee: Secondary | ICD-10-CM | POA: Diagnosis not present

## 2020-02-07 DIAGNOSIS — M25512 Pain in left shoulder: Secondary | ICD-10-CM | POA: Diagnosis not present

## 2020-02-23 DIAGNOSIS — M25512 Pain in left shoulder: Secondary | ICD-10-CM | POA: Diagnosis not present

## 2020-03-10 DIAGNOSIS — M75102 Unspecified rotator cuff tear or rupture of left shoulder, not specified as traumatic: Secondary | ICD-10-CM | POA: Diagnosis not present

## 2020-03-22 DIAGNOSIS — M25512 Pain in left shoulder: Secondary | ICD-10-CM | POA: Insufficient documentation

## 2020-03-23 DIAGNOSIS — M25512 Pain in left shoulder: Secondary | ICD-10-CM | POA: Diagnosis not present

## 2020-04-24 DIAGNOSIS — M75122 Complete rotator cuff tear or rupture of left shoulder, not specified as traumatic: Secondary | ICD-10-CM | POA: Diagnosis not present

## 2020-05-10 ENCOUNTER — Other Ambulatory Visit: Payer: Self-pay | Admitting: Family Medicine

## 2020-05-24 ENCOUNTER — Telehealth: Payer: Self-pay

## 2020-05-24 ENCOUNTER — Other Ambulatory Visit: Payer: Self-pay | Admitting: *Deleted

## 2020-05-24 DIAGNOSIS — E038 Other specified hypothyroidism: Secondary | ICD-10-CM

## 2020-05-24 DIAGNOSIS — R7303 Prediabetes: Secondary | ICD-10-CM

## 2020-05-24 DIAGNOSIS — I10 Essential (primary) hypertension: Secondary | ICD-10-CM

## 2020-05-24 DIAGNOSIS — Z79899 Other long term (current) drug therapy: Secondary | ICD-10-CM

## 2020-05-24 DIAGNOSIS — E7849 Other hyperlipidemia: Secondary | ICD-10-CM

## 2020-05-24 NOTE — Telephone Encounter (Signed)
Labs ordered, patient notified

## 2020-05-24 NOTE — Telephone Encounter (Signed)
Pt is needing blood work done and made appt to go over the results   Pt call back [phone]

## 2020-06-03 LAB — BASIC METABOLIC PANEL
BUN/Creatinine Ratio: 14 (ref 10–24)
BUN: 15 mg/dL (ref 8–27)
CO2: 21 mmol/L (ref 20–29)
Calcium: 9.3 mg/dL (ref 8.6–10.2)
Chloride: 103 mmol/L (ref 96–106)
Creatinine, Ser: 1.06 mg/dL (ref 0.76–1.27)
GFR calc Af Amer: 87 mL/min/{1.73_m2} (ref >59)
GFR calc non Af Amer: 75 mL/min/{1.73_m2} (ref >59)
Glucose: 182 mg/dL — ABNORMAL HIGH (ref 65–99)
Potassium: 5 mmol/L (ref 3.5–5.2)
Sodium: 139 mmol/L (ref 134–144)

## 2020-06-03 LAB — HEPATIC FUNCTION PANEL
ALT: 12 IU/L (ref 0–44)
AST: 14 IU/L (ref 0–40)
Albumin: 4.5 g/dL (ref 3.8–4.8)
Alkaline Phosphatase: 76 IU/L (ref 44–121)
Bilirubin Total: 0.4 mg/dL (ref 0.0–1.2)
Bilirubin, Direct: 0.14 mg/dL (ref 0.00–0.40)
Total Protein: 7.2 g/dL (ref 6.0–8.5)

## 2020-06-03 LAB — LIPID PANEL
Chol/HDL Ratio: 3.3 ratio (ref 0.0–5.0)
Cholesterol, Total: 117 mg/dL (ref 100–199)
HDL: 36 mg/dL — ABNORMAL LOW (ref >39)
LDL Chol Calc (NIH): 51 mg/dL (ref 0–99)
Triglycerides: 182 mg/dL — ABNORMAL HIGH (ref 0–149)
VLDL Cholesterol Cal: 30 mg/dL (ref 5–40)

## 2020-06-03 LAB — TSH: TSH: 0.438 u[IU]/mL — ABNORMAL LOW (ref 0.450–4.500)

## 2020-06-03 LAB — HEMOGLOBIN A1C
Est. average glucose Bld gHb Est-mCnc: 163 mg/dL
Hgb A1c MFr Bld: 7.3 % — ABNORMAL HIGH (ref 4.8–5.6)

## 2020-07-06 ENCOUNTER — Other Ambulatory Visit: Payer: Self-pay | Admitting: Family Medicine

## 2020-07-17 ENCOUNTER — Ambulatory Visit: Payer: BC Managed Care – PPO | Admitting: Family Medicine

## 2020-07-25 ENCOUNTER — Ambulatory Visit (INDEPENDENT_AMBULATORY_CARE_PROVIDER_SITE_OTHER): Payer: BC Managed Care – PPO | Admitting: Family Medicine

## 2020-07-25 ENCOUNTER — Encounter: Payer: Self-pay | Admitting: Family Medicine

## 2020-07-25 ENCOUNTER — Other Ambulatory Visit: Payer: Self-pay

## 2020-07-25 VITALS — BP 128/68 | HR 97 | Temp 97.8°F | Ht 72.5 in | Wt 234.0 lb

## 2020-07-25 DIAGNOSIS — E038 Other specified hypothyroidism: Secondary | ICD-10-CM

## 2020-07-25 DIAGNOSIS — E1169 Type 2 diabetes mellitus with other specified complication: Secondary | ICD-10-CM

## 2020-07-25 DIAGNOSIS — M170 Bilateral primary osteoarthritis of knee: Secondary | ICD-10-CM | POA: Diagnosis not present

## 2020-07-25 DIAGNOSIS — I10 Essential (primary) hypertension: Secondary | ICD-10-CM | POA: Diagnosis not present

## 2020-07-25 DIAGNOSIS — Z125 Encounter for screening for malignant neoplasm of prostate: Secondary | ICD-10-CM

## 2020-07-25 DIAGNOSIS — E785 Hyperlipidemia, unspecified: Secondary | ICD-10-CM

## 2020-07-25 DIAGNOSIS — R7303 Prediabetes: Secondary | ICD-10-CM

## 2020-07-25 MED ORDER — OXYCODONE-ACETAMINOPHEN 5-325 MG PO TABS: 1.0000 | ORAL_TABLET | Freq: Four times a day (QID) | ORAL | 0 refills | Status: DC | PRN

## 2020-07-25 MED ORDER — ROSUVASTATIN CALCIUM 40 MG PO TABS: ORAL_TABLET | ORAL | 1 refills | Status: DC

## 2020-07-25 MED ORDER — LOSARTAN POTASSIUM 100 MG PO TABS: 100.0000 mg | ORAL_TABLET | Freq: Every day | ORAL | 1 refills | Status: DC

## 2020-07-25 MED ORDER — LEVOTHYROXINE SODIUM 175 MCG PO TABS: ORAL_TABLET | ORAL | 1 refills | Status: DC

## 2020-07-25 MED ORDER — OXYCODONE-ACETAMINOPHEN 5-325 MG PO TABS: 1.0000 | ORAL_TABLET | Freq: Four times a day (QID) | ORAL | 0 refills | Status: AC | PRN

## 2020-07-25 NOTE — Patient Instructions (Signed)
Diabetes Mellitus and Nutrition, Adult When you have diabetes, or diabetes mellitus, it is very important to have healthy eating habits because your blood sugar (glucose) levels are greatly affected by what you eat and drink. Eating healthy foods in the right amounts, at about the same times every day, can help you:  Control your blood glucose.  Lower your risk of heart disease.  Improve your blood pressure.  Reach or maintain a healthy weight. What can affect my meal plan? Every person with diabetes is different, and each person has different needs for a meal plan. Your health care provider may recommend that you work with a dietitian to make a meal plan that is best for you. Your meal plan may vary depending on factors such as:  The calories you need.  The medicines you take.  Your weight.  Your blood glucose, blood pressure, and cholesterol levels.  Your activity level.  Other health conditions you have, such as heart or kidney disease. How do carbohydrates affect me? Carbohydrates, also called carbs, affect your blood glucose level more than any other type of food. Eating carbs naturally raises the amount of glucose in your blood. Carb counting is a method for keeping track of how many carbs you eat. Counting carbs is important to keep your blood glucose at a healthy level, especially if you use insulin or take certain oral diabetes medicines. It is important to know how many carbs you can safely have in each meal. This is different for every person. Your dietitian can help you calculate how many carbs you should have at each meal and for each snack. How does alcohol affect me? Alcohol can cause a sudden decrease in blood glucose (hypoglycemia), especially if you use insulin or take certain oral diabetes medicines. Hypoglycemia can be a life-threatening condition. Symptoms of hypoglycemia, such as sleepiness, dizziness, and confusion, are similar to symptoms of having too much  alcohol.  Do not drink alcohol if: ? Your health care provider tells you not to drink. ? You are pregnant, may be pregnant, or are planning to become pregnant.  If you drink alcohol: ? Do not drink on an empty stomach. ? Limit how much you use to:  0-1 drink a day for women.  0-2 drinks a day for men. ? Be aware of how much alcohol is in your drink. In the [place] one drink equals one 12 oz bottle of beer (355 mL), one 5 oz glass of wine (148 mL), or one 1 oz glass of hard liquor (44 mL). ? Keep yourself hydrated with water, diet soda, or unsweetened iced tea.  Keep in mind that regular soda, juice, and other mixers may contain a lot of sugar and must be counted as carbs. What are tips for following this plan? Reading food labels  Start by checking the serving size on the "Nutrition Facts" label of packaged foods and drinks. The amount of calories, carbs, fats, and other nutrients listed on the label is based on one serving of the item. Many items contain more than one serving per package.  Check the total grams (g) of carbs in one serving. You can calculate the number of servings of carbs in one serving by dividing the total carbs by 15. For example, if a food has 30 g of total carbs per serving, it would be equal to 2 servings of carbs.  Check the number of grams (g) of saturated fats and trans fats in one serving. Choose foods that have   a low amount or none of these fats.  Check the number of milligrams (mg) of salt (sodium) in one serving. Most people should limit total sodium intake to less than 2,300 mg per day.  Always check the nutrition information of foods labeled as "low-fat" or "nonfat." These foods may be higher in added sugar or refined carbs and should be avoided.  Talk to your dietitian to identify your daily goals for nutrients listed on the label. Shopping  Avoid buying canned, pre-made, or processed foods. These foods tend to be high in fat, sodium, and added  sugar.  Shop around the outside edge of the grocery store. This is where you will most often find fresh fruits and vegetables, bulk grains, fresh meats, and fresh dairy. Cooking  Use low-heat cooking methods, such as baking, instead of high-heat cooking methods like deep frying.  Cook using healthy oils, such as olive, canola, or sunflower oil.  Avoid cooking with butter, cream, or high-fat meats. Meal planning  Eat meals and snacks regularly, preferably at the same times every day. Avoid going long periods of time without eating.  Eat foods that are high in fiber, such as fresh fruits, vegetables, beans, and whole grains. Talk with your dietitian about how many servings of carbs you can eat at each meal.  Eat 4-6 oz (112-168 g) of lean protein each day, such as lean meat, chicken, fish, eggs, or tofu. One ounce (oz) of lean protein is equal to: ? 1 oz (28 g) of meat, chicken, or fish. ? 1 egg. ?  cup (62 g) of tofu.  Eat some foods each day that contain healthy fats, such as avocado, nuts, seeds, and fish.   What foods should I eat? Fruits Berries. Apples. Oranges. Peaches. Apricots. Plums. Grapes. Mango. Papaya. Pomegranate. Kiwi. Cherries. Vegetables Lettuce. Spinach. Leafy greens, including kale, chard, collard greens, and mustard greens. Beets. Cauliflower. Cabbage. Broccoli. Carrots. Green beans. Tomatoes. Peppers. Onions. Cucumbers. Brussels sprouts. Grains Whole grains, such as whole-wheat or whole-grain bread, crackers, tortillas, cereal, and pasta. Unsweetened oatmeal. Quinoa. Brown or wild rice. Meats and other proteins Seafood. Poultry without skin. Lean cuts of poultry and beef. Tofu. Nuts. Seeds. Dairy Low-fat or fat-free dairy products such as milk, yogurt, and cheese. The items listed above may not be a complete list of foods and beverages you can eat. Contact a dietitian for more information. What foods should I avoid? Fruits Fruits canned with  syrup. Vegetables Canned vegetables. Frozen vegetables with butter or cream sauce. Grains Refined white flour and flour products such as bread, pasta, snack foods, and cereals. Avoid all processed foods. Meats and other proteins Fatty cuts of meat. Poultry with skin. Breaded or fried meats. Processed meat. Avoid saturated fats. Dairy Full-fat yogurt, cheese, or milk. Beverages Sweetened drinks, such as soda or iced tea. The items listed above may not be a complete list of foods and beverages you should avoid. Contact a dietitian for more information. Questions to ask a health care provider  Do I need to meet with a diabetes educator?  Do I need to meet with a dietitian?  What number can I call if I have questions?  When are the best times to check my blood glucose? Where to find more information:  American Diabetes Association: diabetes.org  Academy of Nutrition and Dietetics: www.eatright.org  National Institute of Diabetes and Digestive and Kidney Diseases: www.niddk.nih.gov  Association of Diabetes Care and Education Specialists: www.diabeteseducator.org Summary  It is important to have healthy eating   habits because your blood sugar (glucose) levels are greatly affected by what you eat and drink.  A healthy meal plan will help you control your blood glucose and maintain a healthy lifestyle.  Your health care provider may recommend that you work with a dietitian to make a meal plan that is best for you.  Keep in mind that carbohydrates (carbs) and alcohol have immediate effects on your blood glucose levels. It is important to count carbs and to use alcohol carefully. This information is not intended to replace advice given to you by your health care provider. Make sure you discuss any questions you have with your health care provider. Document Revised: 04/13/2019 Document Reviewed: 04/13/2019 Elsevier Patient Education  2021 Elsevier Inc.  

## 2020-07-25 NOTE — Progress Notes (Signed)
Subjective:    Patient ID: Alexander Banks, male    DOB: [birth year], 65 y.o.   MRN: [id number]  Hypertension This is a chronic problem. The current episode started more than 1 year ago. Pertinent negatives include no chest pain, headaches or shortness of breath. Risk factors for coronary artery disease include dyslipidemia and male gender. Treatments tried: cozaar. There are no compliance problems.   Essential hypertension, benign - Plan: Basic metabolic panel  Other specified hypothyroidism - Plan: TSH  Osteoarthritis of both knees, unspecified osteoarthritis type  Hyperlipidemia associated with type 2 diabetes mellitus ([place])  Prediabetes - Plan: Hemoglobin A1c, Microalbumin / creatinine urine ratio, Basic metabolic panel  Screening PSA (prostate specific antigen) - Plan: PSA   Results for orders placed or performed in visit on 05/24/20  Hemoglobin A1c  Result Value Ref Range   Hgb A1c MFr Bld 7.3 (H) 4.8 - 5.6 %   Est. average glucose Bld gHb Est-mCnc 163 mg/dL  Basic metabolic panel  Result Value Ref Range   Glucose 182 (H) 65 - 99 mg/dL   BUN 15 8 - 27 mg/dL   Creatinine, Ser 1.06 0.76 - 1.27 mg/dL   GFR calc non Af Amer 75 >59 mL/min/1.73   GFR calc Af Amer 87 >59 mL/min/1.73   BUN/Creatinine Ratio 14 10 - 24   Sodium 139 134 - 144 mmol/L   Potassium 5.0 3.5 - 5.2 mmol/L   Chloride 103 96 - 106 mmol/L   CO2 21 20 - 29 mmol/L   Calcium 9.3 8.6 - 10.2 mg/dL  Lipid panel  Result Value Ref Range   Cholesterol, Total 117 100 - 199 mg/dL   Triglycerides 182 (H) 0 - 149 mg/dL   HDL 36 (L) >39 mg/dL   VLDL Cholesterol Cal 30 5 - 40 mg/dL   LDL Chol Calc (NIH) 51 0 - 99 mg/dL   Chol/HDL Ratio 3.3 0.0 - 5.0 ratio  Hepatic function panel  Result Value Ref Range   Total Protein 7.2 6.0 - 8.5 g/dL   Albumin 4.5 3.8 - 4.8 g/dL   Bilirubin Total 0.4 0.0 - 1.2 mg/dL   Bilirubin, Direct 0.14 0.00 - 0.40 mg/dL   Alkaline Phosphatase 76 44 - 121 IU/L   AST 14 0 - 40 IU/L    ALT 12 0 - 44 IU/L  TSH  Result Value Ref Range   TSH 0.438 (L) 0.450 - 4.500 uIU/mL      Review of Systems  Constitutional: Negative for diaphoresis and fatigue.  HENT: Negative for congestion and rhinorrhea.   Respiratory: Negative for cough and shortness of breath.   Cardiovascular: Negative for chest pain and leg swelling.  Gastrointestinal: Negative for abdominal pain and diarrhea.  Skin: Negative for color change and rash.  Neurological: Negative for dizziness and headaches.  Psychiatric/Behavioral: Negative for behavioral problems and confusion.       Objective:   Physical Exam Vitals reviewed.  Constitutional:      General: He is not in acute distress.    Appearance: He is well-nourished.  HENT:     Head: Normocephalic and atraumatic.  Eyes:     General:        Right eye: No discharge.        Left eye: No discharge.  Neck:     Trachea: No tracheal deviation.  Cardiovascular:     Rate and Rhythm: Normal rate and regular rhythm.     Heart sounds: Normal heart sounds. No murmur heard.  Pulmonary:     Effort: Pulmonary effort is normal. No respiratory distress.     Breath sounds: Normal breath sounds.  Musculoskeletal:        General: No edema.  Lymphadenopathy:     Cervical: No cervical adenopathy.  Skin:    General: Skin is warm and dry.  Neurological:     Mental Status: He is alert.     Coordination: Coordination normal.  Psychiatric:        Mood and Affect: Mood and affect normal.        Behavior: Behavior normal.           Assessment & Plan:  1. Essential hypertension, benign Blood pressure good control continue current measures.  Watch diet stay active - Basic metabolic panel  2. Other specified hypothyroidism Medications were just adjusted back in January.  Recheck TSH again before next visit for wellness in the summer - TSH  3. Osteoarthritis of both knees, unspecified osteoarthritis type Patient is contemplating getting right knee  replacement later this summer with Dr. Theda Sers  4. Hyperlipidemia associated with type 2 diabetes mellitus ([place]) Is on statins his A1c is gone up diabetes consistent with the A1c reading very important for patient to do much better job on dietary measures does not want to add additional measures currently.  If not seeing significant improvement with the A1c by summertime consider adding Ozempic or similar medicine  5. Prediabetes Now was technically a diabetic.  Very important for him to watch diet continue medication does not want to add additional medicines currently states he will work much harder on diet and recheck this again in the summer - Hemoglobin A1c - Microalbumin / creatinine urine ratio - Basic metabolic panel  6. Screening PSA (prostate specific antigen) This will be done before his wellness visit in the summer - PSA

## 2020-10-06 DIAGNOSIS — M1712 Unilateral primary osteoarthritis, left knee: Secondary | ICD-10-CM | POA: Diagnosis not present

## 2020-10-06 DIAGNOSIS — M25561 Pain in right knee: Secondary | ICD-10-CM | POA: Diagnosis not present

## 2020-10-09 ENCOUNTER — Telehealth: Payer: Self-pay

## 2020-10-09 NOTE — Telephone Encounter (Signed)
Last seen 07/25/20. Note did mention osteoarthritis of knees and mention he was thinking about surger. Does pt need visit to fill out form

## 2020-10-09 NOTE — Telephone Encounter (Signed)
Paperwork dropped off to be completed for surgical care placed in Dr Nicki Reaper folder

## 2020-10-16 ENCOUNTER — Telehealth: Payer: Self-pay | Admitting: Family Medicine

## 2020-10-16 NOTE — Telephone Encounter (Signed)
I received a notification from Dr. Theda Sers [facility] for knee replacement for this patient to have surgical clearance.  We saw the patient in March.  This was discussed at that time.  So therefore patient does not need an appointment currently  But I would like to do a phone visit with the patient-no charge to the patient but just a brief discussion of surgical issues.  Please connect with the patient to find an evening this week-Tuesday, Wednesday, Friday that I can do a phone visit with him between the hours of 4:30 PM and 5:30 PM thank you  If that does not work for the patient please find out what will

## 2020-10-16 NOTE — Telephone Encounter (Signed)
Please see telephone message in order to set him up for a phone visit thank you

## 2020-10-17 NOTE — Telephone Encounter (Signed)
Left message to return call 

## 2020-10-17 NOTE — Telephone Encounter (Signed)
Patient scheduled phone visit with Dr Nicki Reaper 10/20/20 at 4:10pm

## 2020-10-20 ENCOUNTER — Telehealth (INDEPENDENT_AMBULATORY_CARE_PROVIDER_SITE_OTHER): Payer: BC Managed Care – PPO | Admitting: Family Medicine

## 2020-10-20 ENCOUNTER — Other Ambulatory Visit: Payer: Self-pay

## 2020-10-20 DIAGNOSIS — I10 Essential (primary) hypertension: Secondary | ICD-10-CM

## 2020-10-20 DIAGNOSIS — E038 Other specified hypothyroidism: Secondary | ICD-10-CM

## 2020-10-20 DIAGNOSIS — M170 Bilateral primary osteoarthritis of knee: Secondary | ICD-10-CM

## 2020-10-20 DIAGNOSIS — E785 Hyperlipidemia, unspecified: Secondary | ICD-10-CM

## 2020-10-20 DIAGNOSIS — E1169 Type 2 diabetes mellitus with other specified complication: Secondary | ICD-10-CM | POA: Diagnosis not present

## 2020-10-20 NOTE — Progress Notes (Signed)
   Subjective:    Patient ID: Alexander Banks, male    DOB: 11-24-1955, 65 y.o.   MRN: [id number]  HPIneeds surgical clearance for left total knee replacement.  Patient has underlying diabetes.  His creatinine was good back in January.  A1c at that time was 7.3 Patient does smoke he has been counseled to quit in the past. Patient has osteoarthritis of the knee Does not have any history of cardiac disease or lung disease States he is able to do moderate activity around his house and surrounding acres without severe shortness of breath chest tightness pressure or pain.  Unable to do any type of jogging or intense cardio activity because of his osteoarthritis Virtual Visit via Telephone Note  I connected with [name] on 10/20/20 at  4:10 PM EDT by telephone and verified that I am speaking with the correct person using two identifiers.  Location: Patient: home Provider: office   I discussed the limitations, risks, security and privacy concerns of performing an evaluation and management service by telephone and the availability of in person appointments. I also discussed with the patient that there may be a patient responsible charge related to this service. The patient expressed understanding and agreed to proceed.   History of Present Illness:    Observations/Objective:   Assessment and Plan:   Follow Up Instructions:    I discussed the assessment and treatment plan with the patient. The patient was provided an opportunity to ask questions and all were answered. The patient agreed with the plan and demonstrated an understanding of the instructions.   The patient was advised to call back or seek an in-person evaluation if the symptoms worsen or if the condition fails to improve as anticipated.  I provided 15 minutes of non-face-to-face time during this encounter.      Review of Systems     Objective:   Physical Exam  Today's visit was via telephone Physical exam  was not possible for this visit       Assessment & Plan:  Osteoarthritis of the knee-patient will need to have knee surgery await the review of his labs.  I have encouraged patient to do these in the near future he is gone currently this week and next week but he will get it after that.  Patient is approved for going forward with surgery.  Patient is aware that there is no such thing as a risk-free surgery.  He was encouraged to discuss with orthopedics what is the best way to help prevent any type of blood clots as a result of surgery.  Patient was also instructed to get his lab work done so we would see up-to-date labs.  Obviously if there are any problems with these labs that could affect his approval for surgery.  Patient does have some risk factors for surgery including smoking.  Patient was encouraged to quit smoking at least 1 week in advance.  In addition to this he was encouraged to stay away from any NSAIDs or aspirin 1 week before surgery  Patient does have mild obesity.  Healthy diet recommended.  Does not have any history of cardiac disease and denies any chest tightness pressure pain shortness of breath.  Patient does take his thyroid medicine diabetes medicine blood pressure medicine and cholesterol medicine on a faithful basis

## 2020-11-03 ENCOUNTER — Encounter: Payer: Self-pay | Admitting: Emergency Medicine

## 2020-11-03 ENCOUNTER — Other Ambulatory Visit: Payer: Self-pay

## 2020-11-03 ENCOUNTER — Ambulatory Visit
Admission: EM | Admit: 2020-11-03 | Discharge: 2020-11-03 | Disposition: A | Discharge status: Home / Self Care | Payer: BC Managed Care – PPO | Attending: Emergency Medicine | Admitting: Emergency Medicine

## 2020-11-03 DIAGNOSIS — K611 Rectal abscess: Secondary | ICD-10-CM

## 2020-11-03 DIAGNOSIS — Z125 Encounter for screening for malignant neoplasm of prostate: Secondary | ICD-10-CM | POA: Diagnosis not present

## 2020-11-03 DIAGNOSIS — E038 Other specified hypothyroidism: Secondary | ICD-10-CM | POA: Diagnosis not present

## 2020-11-03 DIAGNOSIS — I10 Essential (primary) hypertension: Secondary | ICD-10-CM | POA: Diagnosis not present

## 2020-11-03 DIAGNOSIS — R7303 Prediabetes: Secondary | ICD-10-CM | POA: Diagnosis not present

## 2020-11-03 MED ORDER — AMOXICILLIN-POT CLAVULANATE 875-125 MG PO TABS: 1.0000 | ORAL_TABLET | Freq: Two times a day (BID) | ORAL | 0 refills | Status: DC

## 2020-11-03 MED ORDER — METRONIDAZOLE 500 MG PO TABS: 500.0000 mg | ORAL_TABLET | Freq: Two times a day (BID) | ORAL | 0 refills | Status: DC

## 2020-11-03 NOTE — Discharge Instructions (Addendum)
Apply warm compresses 3-4x daily for 10-15 minutes Wash site daily with warm water and mild soap Keep covered to avoid friction Take antibiotic as prescribed and to completion Follow up here or with PCP next week for recheck Return or go to the ED if you have any new or worsening symptoms increased redness, swelling, pain, nausea, vomiting, fever, chills, etc..Marland Kitchen

## 2020-11-03 NOTE — ED Provider Notes (Signed)
[facility]   [id number] 11/03/20 Arrival Time: 1140   TO:IZTIWPY  SUBJECTIVE:  Alexander Banks is a 65 y.o. male who presents with a possible abscess of his buttock x 1 week.  Denies precipitating event or trauma.  Does admit to recent travel to [place] by plane and car.  Wife tried popping at home with relief.  Denies similar symptoms in the past.  Complains of subjective fever and purulent drainage x 4-5 days.  Denies nausea, vomiting, chills.   ROS: As per HPI.  All other pertinent ROS negative.     Past Medical History:  Diagnosis Date   Anemia, iron deficiency    Diabetes mellitus without complication ([place])    on meds   Hearing loss    Bil hearing aids   Hyperlipidemia    Hypertension    Prediabetes    Sleep apnea    Past Surgical History:  Procedure Laterality Date   COLONOSCOPY     GIVENS CAPSULE STUDY     KNEE SURGERY     4 surgeries on left, 2 on right knee   SHOULDER SURGERY     right shoulder   Allergies  Allergen Reactions   Lisinopril     Cough   Current Facility-Administered Medications on File Prior to Encounter  Medication Dose Route Frequency Provider Last Rate Last Admin   0.9 %  sodium chloride infusion  500 mL Intravenous Once Doran Stabler, MD       Current Outpatient Medications on File Prior to Encounter  Medication Sig Dispense Refill   diclofenac (VOLTAREN) 75 MG EC tablet TAKE 1 TABLET TWICE A DAY 180 tablet 1   Ferrous Sulfate (IRON) 325 (65 Fe) MG TABS Take by mouth daily at 6 (six) AM.     glucose blood (ONETOUCH VERIO) test strip TEST ONE TIME DAILY 100 each 3   levothyroxine (SYNTHROID) 175 MCG tablet TAKE 1 qd 90 tablet 1   losartan (COZAAR) 100 MG tablet Take 1 tablet (100 mg total) by mouth daily. 90 tablet 1   metFORMIN (GLUCOPHAGE) 1000 MG tablet TAKE 1 TABLET TWICE A DAY 180 tablet 1   OneTouch Delica Lancets 09X MISC TEST ONE TIME DAILY 100 each 11   rosuvastatin (CRESTOR) 40 MG tablet TAKE 1 TABLET DAILY  (STOP ATORVASTATIN) 90 tablet 1   Social History   Socioeconomic History   Marital status: Married    Spouse name: Not on file   Number of children: Not on file   Years of education: Not on file   Highest education level: Not on file  Occupational History   Not on file  Tobacco Use   Smoking status: Every Day    Pack years: 0.00    Types: Cigars   Smokeless tobacco: Never   Tobacco comments:    Smoke 3-4 cigars a day  Substance and Sexual Activity   Alcohol use: Yes    Comment: occasional   Drug use: Never   Sexual activity: Not on file  Other Topics Concern   Not on file  Social History Narrative   Not on file   Social Determinants of Health   Financial Resource Strain: Not on file  Food Insecurity: Not on file  Transportation Needs: Not on file  Physical Activity: Not on file  Stress: Not on file  Social Connections: Not on file  Intimate Partner Violence: Not on file   Family History  Problem Relation Age of Onset   Heart attack  Father    Heart attack Paternal Grandfather    Leukemia Sister    Heart attack Brother     OBJECTIVE:  Vitals:   11/03/20 1235  BP: 113/69  Pulse: 89  Resp: 16  Temp: 98.7 F (37.1 C)  TempSrc: Tympanic  SpO2: 96%     General appearance: alert; no distress HENT: NCAT Lungs: normal respiratory effort Skin: area of erythema and induration to LT gluteal cleft, apx 4-5 cm in diameter, purulent brown, gray drainage present, TTP, no obvious bleeding Psychological: alert and cooperative; normal mood and affect   ASSESSMENT & PLAN:  1. Perirectal abscess     Meds ordered this encounter  Medications   metroNIDAZOLE (FLAGYL) 500 MG tablet    Sig: Take 1 tablet (500 mg total) by mouth 2 (two) times daily.    Dispense:  14 tablet    Refill:  0    Order Specific Question:   Supervising Provider    Answer:   Raylene Everts [[id number]]   amoxicillin-clavulanate (AUGMENTIN) 875-125 MG tablet    Sig: Take 1 tablet by mouth  every 12 (twelve) hours for 7 days.    Dispense:  14 tablet    Refill:  0    Order Specific Question:   Supervising Provider    Answer:   Raylene Everts [[id number]]    Apply warm compresses 3-4x daily for 10-15 minutes Wash site daily with warm water and mild soap Keep covered to avoid friction Take antibiotic as prescribed and to completion Follow up here or with PCP next week for recheck Return or go to the ED if you have any new or worsening symptoms increased redness, swelling, pain, nausea, vomiting, fever, chills, etc...   Reviewed expectations re: course of current medical issues. Questions answered. Outlined signs and symptoms indicating need for more acute intervention. Patient verbalized understanding. After Visit Summary given.           Lestine Box, PA-C 11/03/20 1321

## 2020-11-03 NOTE — ED Triage Notes (Signed)
Boil on right side of buttocks x 1 week. States area is draining.

## 2020-11-04 ENCOUNTER — Telehealth: Payer: Self-pay | Admitting: Family Medicine

## 2020-11-04 ENCOUNTER — Other Ambulatory Visit: Payer: Self-pay | Admitting: Family Medicine

## 2020-11-04 ENCOUNTER — Encounter (HOSPITAL_COMMUNITY): Payer: Self-pay | Admitting: Emergency Medicine

## 2020-11-04 ENCOUNTER — Emergency Department (HOSPITAL_COMMUNITY)
Admission: EM | Admit: 2020-11-04 | Discharge: 2020-11-04 | Disposition: A | Discharge status: Home / Self Care | Payer: BC Managed Care – PPO | Source: Home / Self Care | Attending: Emergency Medicine | Admitting: Emergency Medicine

## 2020-11-04 ENCOUNTER — Other Ambulatory Visit: Payer: Self-pay

## 2020-11-04 DIAGNOSIS — H9193 Unspecified hearing loss, bilateral: Secondary | ICD-10-CM | POA: Diagnosis not present

## 2020-11-04 DIAGNOSIS — E1169 Type 2 diabetes mellitus with other specified complication: Secondary | ICD-10-CM

## 2020-11-04 DIAGNOSIS — Z8249 Family history of ischemic heart disease and other diseases of the circulatory system: Secondary | ICD-10-CM | POA: Diagnosis not present

## 2020-11-04 DIAGNOSIS — L0231 Cutaneous abscess of buttock: Secondary | ICD-10-CM

## 2020-11-04 DIAGNOSIS — E039 Hypothyroidism, unspecified: Secondary | ICD-10-CM | POA: Diagnosis not present

## 2020-11-04 DIAGNOSIS — G4733 Obstructive sleep apnea (adult) (pediatric): Secondary | ICD-10-CM | POA: Diagnosis not present

## 2020-11-04 DIAGNOSIS — K869 Disease of pancreas, unspecified: Secondary | ICD-10-CM | POA: Diagnosis not present

## 2020-11-04 DIAGNOSIS — Z79899 Other long term (current) drug therapy: Secondary | ICD-10-CM | POA: Insufficient documentation

## 2020-11-04 DIAGNOSIS — E1165 Type 2 diabetes mellitus with hyperglycemia: Secondary | ICD-10-CM | POA: Diagnosis not present

## 2020-11-04 DIAGNOSIS — E119 Type 2 diabetes mellitus without complications: Secondary | ICD-10-CM | POA: Diagnosis not present

## 2020-11-04 DIAGNOSIS — K838 Other specified diseases of biliary tract: Secondary | ICD-10-CM | POA: Diagnosis not present

## 2020-11-04 DIAGNOSIS — E782 Mixed hyperlipidemia: Secondary | ICD-10-CM | POA: Diagnosis not present

## 2020-11-04 DIAGNOSIS — E871 Hypo-osmolality and hyponatremia: Secondary | ICD-10-CM | POA: Diagnosis not present

## 2020-11-04 DIAGNOSIS — R52 Pain, unspecified: Secondary | ICD-10-CM | POA: Diagnosis not present

## 2020-11-04 DIAGNOSIS — K831 Obstruction of bile duct: Secondary | ICD-10-CM | POA: Diagnosis not present

## 2020-11-04 DIAGNOSIS — Z806 Family history of leukemia: Secondary | ICD-10-CM | POA: Diagnosis not present

## 2020-11-04 DIAGNOSIS — I10 Essential (primary) hypertension: Secondary | ICD-10-CM | POA: Diagnosis not present

## 2020-11-04 DIAGNOSIS — R600 Localized edema: Secondary | ICD-10-CM | POA: Diagnosis not present

## 2020-11-04 DIAGNOSIS — E1110 Type 2 diabetes mellitus with ketoacidosis without coma: Secondary | ICD-10-CM | POA: Diagnosis not present

## 2020-11-04 DIAGNOSIS — N289 Disorder of kidney and ureter, unspecified: Secondary | ICD-10-CM | POA: Diagnosis not present

## 2020-11-04 DIAGNOSIS — D539 Nutritional anemia, unspecified: Secondary | ICD-10-CM | POA: Diagnosis not present

## 2020-11-04 DIAGNOSIS — K8689 Other specified diseases of pancreas: Secondary | ICD-10-CM | POA: Diagnosis not present

## 2020-11-04 DIAGNOSIS — Z794 Long term (current) use of insulin: Secondary | ICD-10-CM | POA: Insufficient documentation

## 2020-11-04 DIAGNOSIS — K8010 Calculus of gallbladder with chronic cholecystitis without obstruction: Secondary | ICD-10-CM | POA: Diagnosis not present

## 2020-11-04 DIAGNOSIS — D649 Anemia, unspecified: Secondary | ICD-10-CM | POA: Diagnosis not present

## 2020-11-04 DIAGNOSIS — R7989 Other specified abnormal findings of blood chemistry: Secondary | ICD-10-CM | POA: Diagnosis not present

## 2020-11-04 DIAGNOSIS — N178 Other acute kidney failure: Secondary | ICD-10-CM | POA: Insufficient documentation

## 2020-11-04 DIAGNOSIS — Z888 Allergy status to other drugs, medicaments and biological substances status: Secondary | ICD-10-CM | POA: Diagnosis not present

## 2020-11-04 DIAGNOSIS — K7689 Other specified diseases of liver: Secondary | ICD-10-CM | POA: Diagnosis not present

## 2020-11-04 DIAGNOSIS — K760 Fatty (change of) liver, not elsewhere classified: Secondary | ICD-10-CM | POA: Diagnosis not present

## 2020-11-04 DIAGNOSIS — Z48815 Encounter for surgical aftercare following surgery on the digestive system: Secondary | ICD-10-CM | POA: Diagnosis not present

## 2020-11-04 DIAGNOSIS — N179 Acute kidney failure, unspecified: Secondary | ICD-10-CM | POA: Diagnosis not present

## 2020-11-04 DIAGNOSIS — C259 Malignant neoplasm of pancreas, unspecified: Secondary | ICD-10-CM | POA: Diagnosis not present

## 2020-11-04 DIAGNOSIS — K611 Rectal abscess: Secondary | ICD-10-CM | POA: Diagnosis not present

## 2020-11-04 DIAGNOSIS — F17290 Nicotine dependence, other tobacco product, uncomplicated: Secondary | ICD-10-CM | POA: Insufficient documentation

## 2020-11-04 DIAGNOSIS — K612 Anorectal abscess: Secondary | ICD-10-CM | POA: Diagnosis not present

## 2020-11-04 DIAGNOSIS — E785 Hyperlipidemia, unspecified: Secondary | ICD-10-CM | POA: Diagnosis not present

## 2020-11-04 DIAGNOSIS — Z974 Presence of external hearing-aid: Secondary | ICD-10-CM | POA: Diagnosis not present

## 2020-11-04 DIAGNOSIS — Z20822 Contact with and (suspected) exposure to covid-19: Secondary | ICD-10-CM | POA: Diagnosis not present

## 2020-11-04 DIAGNOSIS — K811 Chronic cholecystitis: Secondary | ICD-10-CM | POA: Diagnosis not present

## 2020-11-04 DIAGNOSIS — I700 Atherosclerosis of aorta: Secondary | ICD-10-CM | POA: Diagnosis not present

## 2020-11-04 DIAGNOSIS — K828 Other specified diseases of gallbladder: Secondary | ICD-10-CM | POA: Diagnosis not present

## 2020-11-04 DIAGNOSIS — E0810 Diabetes mellitus due to underlying condition with ketoacidosis without coma: Secondary | ICD-10-CM | POA: Diagnosis not present

## 2020-11-04 DIAGNOSIS — E860 Dehydration: Secondary | ICD-10-CM | POA: Diagnosis not present

## 2020-11-04 DIAGNOSIS — Z79890 Hormone replacement therapy (postmenopausal): Secondary | ICD-10-CM | POA: Diagnosis not present

## 2020-11-04 LAB — COMPREHENSIVE METABOLIC PANEL
ALT: 58 U/L — ABNORMAL HIGH (ref 0–44)
AST: 22 U/L (ref 15–41)
Albumin: 3.3 g/dL — ABNORMAL LOW (ref 3.5–5.0)
Alkaline Phosphatase: 279 U/L — ABNORMAL HIGH (ref 38–126)
Anion gap: 12 (ref 5–15)
BUN: 36 mg/dL — ABNORMAL HIGH (ref 8–23)
CO2: 18 mmol/L — ABNORMAL LOW (ref 22–32)
Calcium: 8.6 mg/dL — ABNORMAL LOW (ref 8.9–10.3)
Chloride: 99 mmol/L (ref 98–111)
Creatinine, Ser: 1.88 mg/dL — ABNORMAL HIGH (ref 0.61–1.24)
GFR, Estimated: 40 mL/min — ABNORMAL LOW (ref >60)
Glucose, Bld: 495 mg/dL — ABNORMAL HIGH (ref 70–99)
Potassium: 4.6 mmol/L (ref 3.5–5.1)
Sodium: 129 mmol/L — ABNORMAL LOW (ref 135–145)
Total Bilirubin: 1.1 mg/dL (ref 0.3–1.2)
Total Protein: 7.6 g/dL (ref 6.5–8.1)

## 2020-11-04 LAB — CBC WITH DIFFERENTIAL/PLATELET
Abs Immature Granulocytes: 0.03 10*3/uL (ref 0.00–0.07)
Basophils Absolute: 0.1 10*3/uL (ref 0.0–0.1)
Basophils Relative: 1 %
Eosinophils Absolute: 0 10*3/uL (ref 0.0–0.5)
Eosinophils Relative: 0 %
HCT: 37.6 % — ABNORMAL LOW (ref 39.0–52.0)
Hemoglobin: 12.6 g/dL — ABNORMAL LOW (ref 13.0–17.0)
Immature Granulocytes: 0 %
Lymphocytes Relative: 10 %
Lymphs Abs: 0.9 10*3/uL (ref 0.7–4.0)
MCH: 33.2 pg (ref 26.0–34.0)
MCHC: 33.5 g/dL (ref 30.0–36.0)
MCV: 98.9 fL (ref 80.0–100.0)
Monocytes Absolute: 0.8 10*3/uL (ref 0.1–1.0)
Monocytes Relative: 10 %
Neutro Abs: 6.6 10*3/uL (ref 1.7–7.7)
Neutrophils Relative %: 79 %
Platelets: 385 10*3/uL (ref 150–400)
RBC: 3.8 MIL/uL — ABNORMAL LOW (ref 4.22–5.81)
RDW: 13.2 % (ref 11.5–15.5)
WBC: 8.4 10*3/uL (ref 4.0–10.5)
nRBC: 0 % (ref 0.0–0.2)

## 2020-11-04 LAB — BASIC METABOLIC PANEL
Anion gap: 8 (ref 5–15)
BUN: 33 mg/dL — ABNORMAL HIGH (ref 8–23)
CO2: 21 mmol/L — ABNORMAL LOW (ref 22–32)
Calcium: 8.3 mg/dL — ABNORMAL LOW (ref 8.9–10.3)
Chloride: 103 mmol/L (ref 98–111)
Creatinine, Ser: 1.69 mg/dL — ABNORMAL HIGH (ref 0.61–1.24)
GFR, Estimated: 45 mL/min — ABNORMAL LOW (ref >60)
Glucose, Bld: 389 mg/dL — ABNORMAL HIGH (ref 70–99)
Potassium: 4.2 mmol/L (ref 3.5–5.1)
Sodium: 132 mmol/L — ABNORMAL LOW (ref 135–145)

## 2020-11-04 LAB — URINALYSIS, ROUTINE W REFLEX MICROSCOPIC
Bacteria, UA: NONE SEEN
Bilirubin Urine: NEGATIVE
Glucose, UA: >=500 mg/dL — AB
Ketones, ur: 20 mg/dL — AB
Leukocytes,Ua: NEGATIVE
Nitrite: NEGATIVE
Protein, ur: 30 mg/dL — AB
Specific Gravity, Urine: 1.028 (ref 1.005–1.030)
pH: 5 (ref 5.0–8.0)

## 2020-11-04 LAB — CBG MONITORING, ED
Glucose-Capillary: 472 mg/dL — ABNORMAL HIGH (ref 70–99)
Glucose-Capillary: 362 mg/dL — ABNORMAL HIGH (ref 70–99)

## 2020-11-04 LAB — BETA-HYDROXYBUTYRIC ACID: Beta-Hydroxybutyric Acid: 2.59 mmol/L — ABNORMAL HIGH (ref 0.05–0.27)

## 2020-11-04 MED ORDER — INSULIN DETEMIR 100 UNIT/ML FLEXPEN: PEN_INJECTOR | SUBCUTANEOUS | 3 refills | Status: DC

## 2020-11-04 MED ORDER — INSULIN LISPRO (1 UNIT DIAL) 100 UNIT/ML (KWIKPEN): PEN_INJECTOR | SUBCUTANEOUS | 3 refills | Status: DC

## 2020-11-04 MED ORDER — INSULIN ASPART 100 UNIT/ML IJ SOLN
10.0000 [IU] | Freq: Once | INTRAMUSCULAR | Status: AC
  Administered 2020-11-04: 10 [IU] via SUBCUTANEOUS
  Filled 2020-11-04: qty 1

## 2020-11-04 MED ORDER — SODIUM CHLORIDE 0.9 % IV BOLUS
1000.0000 mL | Freq: Once | INTRAVENOUS | Status: AC
  Administered 2020-11-04: 1000 mL via INTRAVENOUS

## 2020-11-04 NOTE — Telephone Encounter (Signed)
I reviewed over patient's lab work  Called the patient.  He states he is noticing that he has increased thirst and urination but denies passing out.  Able to do activity.  Recently got back from a trip.  States over the past several weeks he has noticed slight decrease in appetite.  No drowsiness no disorientation.  Tries to drink plenty of fluids.  Over the past several months he has been cutting back on his eating to try to lose weight and is actually dropped a fair amount of weight.  Lab work shows significant elevation of creatinine along with elevation of bicarb and glucose at 502  We will go ahead with several things.  This was communicated to the patient. Stop metformin Stop losartan Stop diclofenac  Start long-acting insulin and short acting insulin.  We will find out from his pharmacy which 1 of these is covered and initiate.  Patient will check readings on a regular basis.  He will follow recommendations put forth by [place]. We will communicate with patient over the course of the next couple days and recheck him on Monday.  Certainly if situation worsens over the weekend we will route him to the ER otherwise we will see him on Monday.  Patient was cautioned not to overexert over the next few days.

## 2020-11-04 NOTE — ED Provider Notes (Signed)
[facility] EMERGENCY DEPARTMENT Provider Note   CSN: [id number] Arrival date & time: 11/04/20  1120     History Chief Complaint  Patient presents with   Hyperglycemia    Alexander Banks is a 65 y.o. male.   Hyperglycemia  This patient is a 65 year old male, he has a history of diabetes for which she has taken metformin, he has also been on hypertension medications including an angiotensin receptor blocker.  The patient was seen yesterday at an urgent care where he had an abscess identified on his buttock, he was started on Augmentin and Flagyl.  He had recent lab work done by his family doctor, Dr. Sallee Lange, he followed up with his doctor today by phone, the blood work showed that his creatinine had doubled from 1-2 his glucose was over 500 and his bicarbonate was low.  The patient was cautioned to stop taking his medications like metformin and angiotensin receptor blocker and was advised to come to the emergency department to get IV fluids and some insulin.  When I spoke with Dr. Wolfgang Phoenix prior to arrival he states he has already called the insulin into the patient's pharmacy so he can start on it as an outpatient.  He would like [place] to further evaluate this patient, make sure he does not need admission for diabetic ketoacidosis and is happy to follow him up in the office to monitor his renal function and blood sugars.  The patient reports to me that he has been in significant discomfort with his buttock until about 5 days ago when it started to spontaneously drain.  He started Augmentin and metronidazole yesterday and states that that has helped as well.  He has been concerned because of progressive fatigue, excessive weight loss over the last month, excessive thirst and excessive urination.  At this time he does not have any focal complaints other than feeling a little bit fatigued.  Symptoms are progressive, not associated with fevers  Past Medical History:  Diagnosis Date   Anemia,  iron deficiency    Diabetes mellitus without complication ([place])    on meds   Hearing loss    Bil hearing aids   Hyperlipidemia    Hypertension    Prediabetes    Sleep apnea     Patient Active Problem List   Diagnosis Date Noted   Hypothyroidism 09/27/2014   Prediabetes 12/07/2012   Obstructive sleep apnea 12/07/2012   Obesity 12/07/2012   Osteoarthritis 12/07/2012   Hyperlipidemia 10/19/2012   Essential hypertension, benign 10/19/2012    Past Surgical History:  Procedure Laterality Date   COLONOSCOPY     GIVENS CAPSULE STUDY     KNEE SURGERY     4 surgeries on left, 2 on right knee   SHOULDER SURGERY     right shoulder       Family History  Problem Relation Age of Onset   Heart attack Father    Heart attack Paternal Grandfather    Leukemia Sister    Heart attack Brother     Social History   Tobacco Use   Smoking status: Every Day    Pack years: 0.00    Types: Cigars   Smokeless tobacco: Never   Tobacco comments:    Smoke 3-4 cigars a day  Vaping Use   Vaping Use: Never used  Substance Use Topics   Alcohol use: Yes    Comment: occasional   Drug use: Never    Home Medications Prior to Admission medications  Medication Sig Start Date End Date Taking? Authorizing Provider  amoxicillin-clavulanate (AUGMENTIN) 875-125 MG tablet Take 1 tablet by mouth every 12 (twelve) hours for 7 days. 11/03/20 11/10/20 Yes Wurst, [place], PA-C  glucose blood (ONETOUCH VERIO) test strip TEST ONE TIME DAILY 11/30/19  Yes [name], MD  insulin detemir (LEVEMIR) 100 UNIT/ML FlexPen 8 units may taper up to 30 units upon direction of physician 11/04/20  Yes [name], MD  insulin lispro (HUMALOG KWIKPEN) 100 UNIT/ML KwikPen 5 units with lunch and dinner as directed by physician 11/04/20  Yes [name], MD  levothyroxine (SYNTHROID) 175 MCG tablet TAKE 1 qd 07/25/20  Yes Luking, Elayne Snare, MD  metroNIDAZOLE (FLAGYL) 500 MG tablet Take 1 tablet (500 mg total) by mouth  2 (two) times daily. 11/03/20  Yes Wurst, [place], PA-C  OneTouch Delica Lancets 15B MISC TEST ONE TIME DAILY 11/30/19  Yes [name], MD  rosuvastatin (CRESTOR) 40 MG tablet TAKE 1 TABLET DAILY (STOP ATORVASTATIN) 07/25/20  Yes [name], MD    Allergies    Lisinopril  Review of Systems   Review of Systems  All other systems reviewed and are negative.  Physical Exam Updated Vital Signs BP 128/78   Pulse 81   Temp 98.2 F (36.8 C) (Oral)   Resp 18   Ht 1.88 m (6\' 2" )   Wt 90.3 kg   SpO2 97%   BMI 25.55 kg/m   Physical Exam Vitals and nursing note reviewed.  Constitutional:      General: He is not in acute distress.    Appearance: He is well-developed.  HENT:     Head: Normocephalic and atraumatic.     Mouth/Throat:     Pharynx: No oropharyngeal exudate.  Eyes:     General: No scleral icterus.       Right eye: No discharge.        Left eye: No discharge.     Conjunctiva/sclera: Conjunctivae normal.     Pupils: Pupils are equal, round, and reactive to light.  Neck:     Thyroid: No thyromegaly.     Vascular: No JVD.  Cardiovascular:     Rate and Rhythm: Normal rate and regular rhythm.     Heart sounds: Normal heart sounds. No murmur heard.   No friction rub. No gallop.  Pulmonary:     Effort: Pulmonary effort is normal. No respiratory distress.     Breath sounds: Normal breath sounds. No wheezing or rales.  Abdominal:     General: Bowel sounds are normal. There is no distension.     Palpations: Abdomen is soft. There is no mass.     Tenderness: There is no abdominal tenderness.  Musculoskeletal:        General: No tenderness. Normal range of motion.     Cervical back: Normal range of motion and neck supple.     Right lower leg: No edema.     Left lower leg: No edema.  Lymphadenopathy:     Cervical: No cervical adenopathy.  Skin:    General: Skin is warm and dry.     Findings: No erythema or rash.     Comments: Abscess present to the right buttock,  spontaneously draining, no foul drainage, minimal tenderness biotic buttock, spontaneously draining - no foul smell, minimal ttp.  Neurological:     General: No focal deficit present.     Mental Status: He is alert.     Coordination: Coordination normal.  Psychiatric:        Behavior: Behavior normal.    ED Results / Procedures / Treatments   Labs (all labs ordered are listed, but only abnormal results are displayed) Labs Reviewed  CBC WITH DIFFERENTIAL/PLATELET - Abnormal; Notable for the following components:      Result Value   RBC 3.80 (*)    Hemoglobin 12.6 (*)    HCT 37.6 (*)    All other components within normal limits  COMPREHENSIVE METABOLIC PANEL - Abnormal; Notable for the following components:   Sodium 129 (*)    CO2 18 (*)    Glucose, Bld 495 (*)    BUN 36 (*)    Creatinine, Ser 1.88 (*)    Calcium 8.6 (*)    Albumin 3.3 (*)    ALT 58 (*)    Alkaline Phosphatase 279 (*)    GFR, Estimated 40 (*)    All other components within normal limits  URINALYSIS, ROUTINE W REFLEX MICROSCOPIC - Abnormal; Notable for the following components:   APPearance HAZY (*)    Glucose, UA >=500 (*)    Hgb urine dipstick SMALL (*)    Ketones, ur 20 (*)    Protein, ur 30 (*)    All other components within normal limits  BETA-HYDROXYBUTYRIC ACID - Abnormal; Notable for the following components:   Beta-Hydroxybutyric Acid 2.59 (*)    All other components within normal limits  BASIC METABOLIC PANEL - Abnormal; Notable for the following components:   Sodium 132 (*)    CO2 21 (*)    Glucose, Bld 389 (*)    BUN 33 (*)    Creatinine, Ser 1.69 (*)    Calcium 8.3 (*)    GFR, Estimated 45 (*)    All other components within normal limits  CBG MONITORING, ED - Abnormal; Notable for the following components:   Glucose-Capillary 472 (*)    All other components within normal limits  CBG MONITORING, ED - Abnormal; Notable for the following components:   Glucose-Capillary 362 (*)    All  other components within normal limits  HEMOGLOBIN A1C    EKG None  Radiology No results found.  Procedures Procedures   Medications Ordered in ED Medications  sodium chloride 0.9 % bolus 1,000 mL (0 mLs Intravenous Stopped 11/04/20 1344)  sodium chloride 0.9 % bolus 1,000 mL (0 mLs Intravenous Stopped 11/04/20 1458)  insulin aspart (novoLOG) injection 10 Units (10 Units Subcutaneous Given 11/04/20 1348)    ED Course  I have reviewed the triage vital signs and the nursing notes.  Pertinent labs & imaging results that were available during my care of the patient were reviewed by me and considered in my medical decision making (see chart for details).    MDM Rules/Calculators/A&P                          Patient appears well, vital signs are reassuring, I am concerned about his level of hydration and possibly having early DKA based on his lab work that was reported from the family doctor's office.  At this time we will recheck labs today, give IV fluids, he may need some insulin, he does not appear to be in distress at this time  IV fluids and insulin given, recheck metabolic panel is showing no signs of anion gap, CO2 is 21, patient is otherwise well-appearing has all of his medications for home and at this time is stable.  He understands  indications for return, his renal function will need to be rechecked, he has an appointment on Monday with his primary care physician within 48 hours  Final Clinical Impression(s) / ED Diagnoses Final diagnoses:  Type 2 diabetes mellitus with other specified complication, without long-term current use of insulin ([place])  Renal insufficiency  Abscess of buttock    Rx / DC Orders ED Discharge Orders     None        Noemi Chapel, MD 11/04/20 1600

## 2020-11-04 NOTE — ED Triage Notes (Signed)
Dr Sabra Heck in room to do medical screening.

## 2020-11-04 NOTE — ED Triage Notes (Addendum)
Patient states he has been hyperglycemic "for a while" with last blood glucose being 507. Per patient had blood drawn yesterday for PCP and was called and told to come to ER for possible dehydration. Patient reports increase in urinary frequency. Denies any fevers, nausea, vomiting, or diarrhea. Patient states he just started taking x2 antibiotics for abscess on right buttock.

## 2020-11-04 NOTE — Discharge Instructions (Signed)
Please start taking the insulin exactly as your doctor has prescribed it, you should be checking your blood sugar multiple times per day and please share these numbers with your doctor when you see him on Monday.  Finish the antibiotics  Follow a diabetic diet  Drink plenty of clear liquids  Return to the emergency department for severe or worsening symptoms

## 2020-11-04 NOTE — Telephone Encounter (Signed)
Upon further consideration of the patient's situation the following:  Patient did state that he has had at times feeling lightheaded with standing. Creatinine significantly elevated Bicarb is low. Current glucose is 507  Given his progressive symptoms over the past several weeks in his laboratory numbers this is indicating his pancreas is not producing insulin adequately.  I have discussed his case with ED Dr. Sabra Heck We are referring him to the ER for additional testing to make sure that he is not entering into diabetic ketoacidosis Also more than likely will need IV fluids and insulin May or may not be able to be released later today  I have connected with his local pharmacy to prescribe him long-acting and short acting insulin that will be able to cover him when he is discharged from the ER  Patient has been told to do follow-up on Monday at 11:40 AM and we will touch base with patient by phone over the weekend should he be released

## 2020-11-04 NOTE — Telephone Encounter (Signed)
[facility] stated that they would not be able to fill his prescription because of his insurance card will not allow them to do so.  I spoke with the patient.  They will call their insurance and find out which pharmacy you can fill his insulin and then they will let me know.

## 2020-11-06 ENCOUNTER — Emergency Department (HOSPITAL_COMMUNITY): Payer: BC Managed Care – PPO

## 2020-11-06 ENCOUNTER — Other Ambulatory Visit: Payer: Self-pay

## 2020-11-06 ENCOUNTER — Encounter: Payer: Self-pay | Admitting: Family Medicine

## 2020-11-06 ENCOUNTER — Other Ambulatory Visit (HOSPITAL_COMMUNITY)
Admission: RE | Admit: 2020-11-06 | Discharge: 2020-11-06 | Disposition: A | Discharge status: Home / Self Care | Payer: BC Managed Care – PPO | Source: Ambulatory Visit | Attending: Family Medicine | Admitting: Family Medicine

## 2020-11-06 ENCOUNTER — Inpatient Hospital Stay (HOSPITAL_COMMUNITY)
Admission: EM | Admit: 2020-11-06 | Discharge: 2020-11-10 | DRG: 638 | Disposition: A | Discharge status: Home / Self Care | Payer: BC Managed Care – PPO | Attending: Family Medicine | Admitting: Family Medicine

## 2020-11-06 ENCOUNTER — Encounter (HOSPITAL_COMMUNITY): Payer: Self-pay | Admitting: *Deleted

## 2020-11-06 ENCOUNTER — Ambulatory Visit (INDEPENDENT_AMBULATORY_CARE_PROVIDER_SITE_OTHER): Payer: BC Managed Care – PPO | Admitting: Family Medicine

## 2020-11-06 ENCOUNTER — Telehealth: Payer: Self-pay | Admitting: *Deleted

## 2020-11-06 VITALS — BP 114/68 | HR 94 | Temp 98.2°F | Ht 74.0 in | Wt 202.8 lb

## 2020-11-06 DIAGNOSIS — K611 Rectal abscess: Secondary | ICD-10-CM

## 2020-11-06 DIAGNOSIS — E1165 Type 2 diabetes mellitus with hyperglycemia: Secondary | ICD-10-CM

## 2020-11-06 DIAGNOSIS — E1110 Type 2 diabetes mellitus with ketoacidosis without coma: Principal | ICD-10-CM

## 2020-11-06 DIAGNOSIS — D490 Neoplasm of unspecified behavior of digestive system: Secondary | ICD-10-CM

## 2020-11-06 DIAGNOSIS — G4733 Obstructive sleep apnea (adult) (pediatric): Secondary | ICD-10-CM | POA: Diagnosis present

## 2020-11-06 DIAGNOSIS — Z794 Long term (current) use of insulin: Secondary | ICD-10-CM

## 2020-11-06 DIAGNOSIS — N179 Acute kidney failure, unspecified: Secondary | ICD-10-CM | POA: Diagnosis present

## 2020-11-06 DIAGNOSIS — Z888 Allergy status to other drugs, medicaments and biological substances status: Secondary | ICD-10-CM

## 2020-11-06 DIAGNOSIS — F17290 Nicotine dependence, other tobacco product, uncomplicated: Secondary | ICD-10-CM | POA: Diagnosis present

## 2020-11-06 DIAGNOSIS — Z79899 Other long term (current) drug therapy: Secondary | ICD-10-CM

## 2020-11-06 DIAGNOSIS — E039 Hypothyroidism, unspecified: Secondary | ICD-10-CM | POA: Diagnosis present

## 2020-11-06 DIAGNOSIS — Z8249 Family history of ischemic heart disease and other diseases of the circulatory system: Secondary | ICD-10-CM

## 2020-11-06 DIAGNOSIS — R7989 Other specified abnormal findings of blood chemistry: Secondary | ICD-10-CM

## 2020-11-06 DIAGNOSIS — I10 Essential (primary) hypertension: Secondary | ICD-10-CM | POA: Diagnosis present

## 2020-11-06 DIAGNOSIS — Z20822 Contact with and (suspected) exposure to covid-19: Secondary | ICD-10-CM | POA: Diagnosis present

## 2020-11-06 DIAGNOSIS — R52 Pain, unspecified: Secondary | ICD-10-CM

## 2020-11-06 DIAGNOSIS — E860 Dehydration: Secondary | ICD-10-CM | POA: Diagnosis present

## 2020-11-06 DIAGNOSIS — K811 Chronic cholecystitis: Secondary | ICD-10-CM

## 2020-11-06 DIAGNOSIS — L0231 Cutaneous abscess of buttock: Secondary | ICD-10-CM | POA: Diagnosis present

## 2020-11-06 DIAGNOSIS — H9193 Unspecified hearing loss, bilateral: Secondary | ICD-10-CM | POA: Diagnosis present

## 2020-11-06 DIAGNOSIS — Z79890 Hormone replacement therapy (postmenopausal): Secondary | ICD-10-CM

## 2020-11-06 DIAGNOSIS — Z806 Family history of leukemia: Secondary | ICD-10-CM

## 2020-11-06 DIAGNOSIS — E119 Type 2 diabetes mellitus without complications: Secondary | ICD-10-CM

## 2020-11-06 DIAGNOSIS — K8010 Calculus of gallbladder with chronic cholecystitis without obstruction: Secondary | ICD-10-CM | POA: Diagnosis present

## 2020-11-06 DIAGNOSIS — L0390 Cellulitis, unspecified: Secondary | ICD-10-CM

## 2020-11-06 DIAGNOSIS — E871 Hypo-osmolality and hyponatremia: Secondary | ICD-10-CM | POA: Diagnosis present

## 2020-11-06 DIAGNOSIS — K612 Anorectal abscess: Secondary | ICD-10-CM | POA: Diagnosis present

## 2020-11-06 DIAGNOSIS — K8050 Calculus of bile duct without cholangitis or cholecystitis without obstruction: Secondary | ICD-10-CM

## 2020-11-06 DIAGNOSIS — E785 Hyperlipidemia, unspecified: Secondary | ICD-10-CM | POA: Diagnosis present

## 2020-11-06 DIAGNOSIS — D638 Anemia in other chronic diseases classified elsewhere: Secondary | ICD-10-CM

## 2020-11-06 DIAGNOSIS — C259 Malignant neoplasm of pancreas, unspecified: Secondary | ICD-10-CM | POA: Diagnosis present

## 2020-11-06 DIAGNOSIS — K8689 Other specified diseases of pancreas: Secondary | ICD-10-CM

## 2020-11-06 DIAGNOSIS — D539 Nutritional anemia, unspecified: Secondary | ICD-10-CM | POA: Diagnosis present

## 2020-11-06 DIAGNOSIS — Z974 Presence of external hearing-aid: Secondary | ICD-10-CM

## 2020-11-06 LAB — BLOOD GAS, VENOUS
Acid-base deficit: 6.8 mmol/L — ABNORMAL HIGH (ref 0.0–2.0)
Bicarbonate: 18.2 mmol/L — ABNORMAL LOW (ref 20.0–28.0)
FIO2: 21
O2 Saturation: 61.3 %
Patient temperature: 37.3
pCO2, Ven: 37.4 mmHg — ABNORMAL LOW (ref 44.0–60.0)
pH, Ven: 7.31 (ref 7.250–7.430)
pO2, Ven: 34.5 mmHg (ref 32.0–45.0)

## 2020-11-06 LAB — CBG MONITORING, ED
Glucose-Capillary: 391 mg/dL — ABNORMAL HIGH (ref 70–99)
Glucose-Capillary: 407 mg/dL — ABNORMAL HIGH (ref 70–99)
Glucose-Capillary: 446 mg/dL — ABNORMAL HIGH (ref 70–99)

## 2020-11-06 LAB — BASIC METABOLIC PANEL
Anion gap: 15 (ref 5–15)
Anion gap: 10 (ref 5–15)
Anion gap: 11 (ref 5–15)
BUN: 26 mg/dL — ABNORMAL HIGH (ref 8–23)
BUN: 25 mg/dL — ABNORMAL HIGH (ref 8–23)
BUN: 25 mg/dL — ABNORMAL HIGH (ref 8–23)
CO2: 16 mmol/L — ABNORMAL LOW (ref 22–32)
CO2: 21 mmol/L — ABNORMAL LOW (ref 22–32)
CO2: 19 mmol/L — ABNORMAL LOW (ref 22–32)
Calcium: 9.3 mg/dL (ref 8.9–10.3)
Calcium: 9.1 mg/dL (ref 8.9–10.3)
Calcium: 9.2 mg/dL (ref 8.9–10.3)
Chloride: 102 mmol/L (ref 98–111)
Chloride: 99 mmol/L (ref 98–111)
Chloride: 98 mmol/L (ref 98–111)
Creatinine, Ser: 1.75 mg/dL — ABNORMAL HIGH (ref 0.61–1.24)
Creatinine, Ser: 1.59 mg/dL — ABNORMAL HIGH (ref 0.61–1.24)
Creatinine, Ser: 1.66 mg/dL — ABNORMAL HIGH (ref 0.61–1.24)
GFR, Estimated: 43 mL/min — ABNORMAL LOW (ref >60)
GFR, Estimated: 48 mL/min — ABNORMAL LOW (ref >60)
GFR, Estimated: 46 mL/min — ABNORMAL LOW (ref >60)
Glucose, Bld: 406 mg/dL — ABNORMAL HIGH (ref 70–99)
Glucose, Bld: 465 mg/dL — ABNORMAL HIGH (ref 70–99)
Glucose, Bld: 476 mg/dL — ABNORMAL HIGH (ref 70–99)
Potassium: 4.6 mmol/L (ref 3.5–5.1)
Potassium: 4.5 mmol/L (ref 3.5–5.1)
Potassium: 4 mmol/L (ref 3.5–5.1)
Sodium: 133 mmol/L — ABNORMAL LOW (ref 135–145)
Sodium: 130 mmol/L — ABNORMAL LOW (ref 135–145)
Sodium: 128 mmol/L — ABNORMAL LOW (ref 135–145)

## 2020-11-06 LAB — CBC WITH DIFFERENTIAL/PLATELET
Abs Immature Granulocytes: 0.1 10*3/uL — ABNORMAL HIGH (ref 0.00–0.07)
Abs Immature Granulocytes: 0.07 10*3/uL (ref 0.00–0.07)
Basophils Absolute: 0.1 10*3/uL (ref 0.0–0.1)
Basophils Absolute: 0 10*3/uL (ref 0.0–0.1)
Basophils Relative: 1 %
Basophils Relative: 1 %
Eosinophils Absolute: 0 10*3/uL (ref 0.0–0.5)
Eosinophils Absolute: 0 10*3/uL (ref 0.0–0.5)
Eosinophils Relative: 0 %
Eosinophils Relative: 1 %
HCT: 38.6 % — ABNORMAL LOW (ref 39.0–52.0)
HCT: 36.2 % — ABNORMAL LOW (ref 39.0–52.0)
Hemoglobin: 12.6 g/dL — ABNORMAL LOW (ref 13.0–17.0)
Hemoglobin: 12.2 g/dL — ABNORMAL LOW (ref 13.0–17.0)
Immature Granulocytes: 2 %
Immature Granulocytes: 1 %
Lymphocytes Relative: 15 %
Lymphocytes Relative: 16 %
Lymphs Abs: 1 10*3/uL (ref 0.7–4.0)
Lymphs Abs: 0.9 10*3/uL (ref 0.7–4.0)
MCH: 33 pg (ref 26.0–34.0)
MCH: 33.6 pg (ref 26.0–34.0)
MCHC: 32.6 g/dL (ref 30.0–36.0)
MCHC: 33.7 g/dL (ref 30.0–36.0)
MCV: 101 fL — ABNORMAL HIGH (ref 80.0–100.0)
MCV: 99.7 fL (ref 80.0–100.0)
Monocytes Absolute: 0.8 10*3/uL (ref 0.1–1.0)
Monocytes Absolute: 0.6 10*3/uL (ref 0.1–1.0)
Monocytes Relative: 12 %
Monocytes Relative: 12 %
Neutro Abs: 4.9 10*3/uL (ref 1.7–7.7)
Neutro Abs: 3.8 10*3/uL (ref 1.7–7.7)
Neutrophils Relative %: 70 %
Neutrophils Relative %: 69 %
Platelets: 410 10*3/uL — ABNORMAL HIGH (ref 150–400)
Platelets: 378 10*3/uL (ref 150–400)
RBC: 3.82 MIL/uL — ABNORMAL LOW (ref 4.22–5.81)
RBC: 3.63 MIL/uL — ABNORMAL LOW (ref 4.22–5.81)
RDW: 13.5 % (ref 11.5–15.5)
RDW: 13.5 % (ref 11.5–15.5)
WBC: 6.8 10*3/uL (ref 4.0–10.5)
WBC: 5.4 10*3/uL (ref 4.0–10.5)
nRBC: 0 % (ref 0.0–0.2)
nRBC: 0 % (ref 0.0–0.2)

## 2020-11-06 LAB — HEMOGLOBIN A1C
Hgb A1c MFr Bld: 12.8 % — ABNORMAL HIGH (ref 4.8–5.6)
Mean Plasma Glucose: 321 mg/dL

## 2020-11-06 LAB — BETA-HYDROXYBUTYRIC ACID
Beta-Hydroxybutyric Acid: 3.86 mmol/L — ABNORMAL HIGH (ref 0.05–0.27)
Beta-Hydroxybutyric Acid: 1.89 mmol/L — ABNORMAL HIGH (ref 0.05–0.27)

## 2020-11-06 IMAGING — CT CT PELVIS W/ CM
2 of 4 series · 17 of 46 positions shown, 19 images · IV contrast (omnipaque)
Comparison: None.

CLINICAL DATA: Anal or rectal abscess. Pt. Admitted for blood sugar
problems. Pt. Has known rectal abscess.

EXAM:
CT PELVIS WITH CONTRAST
TECHNIQUE: Multidetector CT imaging of the pelvis was performed using the
standard protocol following the bolus administration of intravenous
contrast.
CONTRAST:  75mL OMNIPAQUE IOHEXOL 300 MG/ML  SOLN

[Series 2: axial st · axial · 0.78mm/px · z∈[+842,+1157]mm · 14 of 73 slices shown, 16 images]
[im 5/73  soft-tissue]
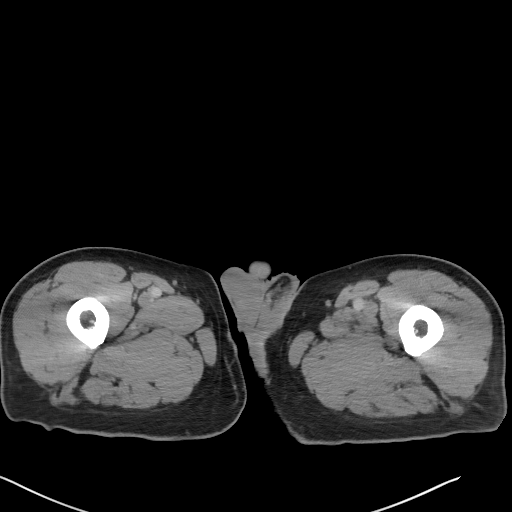
[im 5/73  bone]
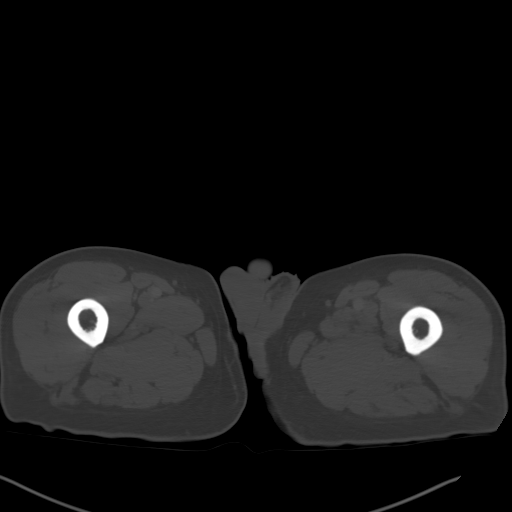
[im 10/73  soft-tissue]
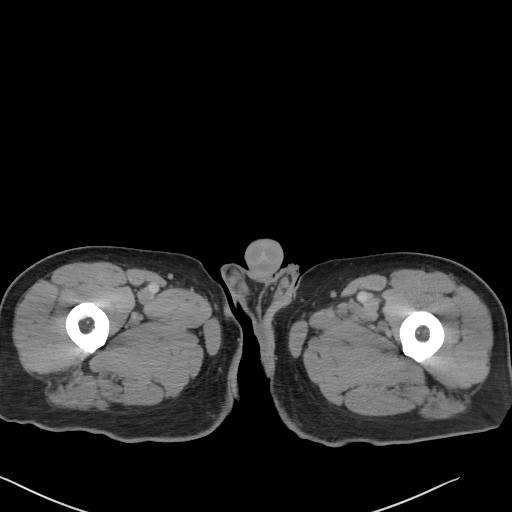
[im 15/73  soft-tissue]
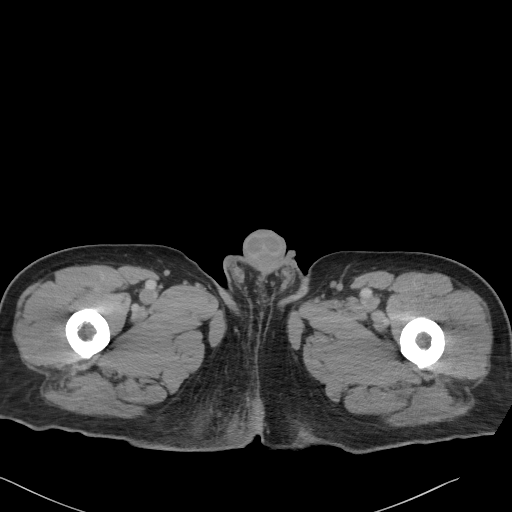
[im 20/73  soft-tissue]
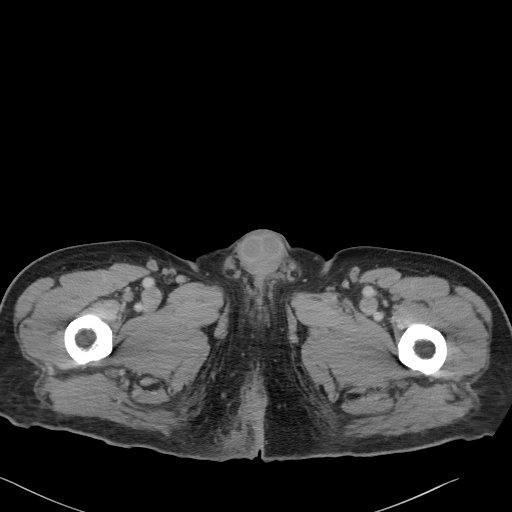
[im 25/73  soft-tissue]
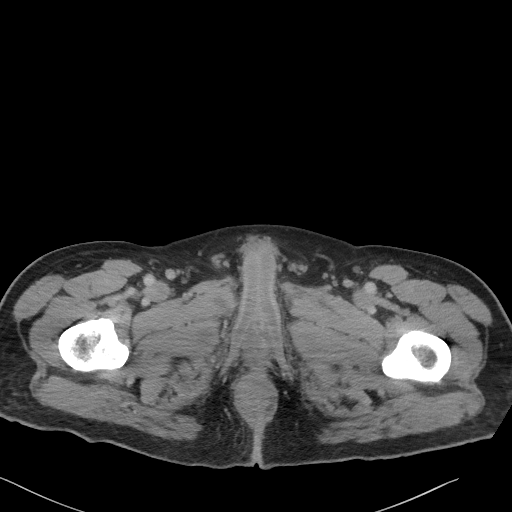
[im 29/73  soft-tissue]
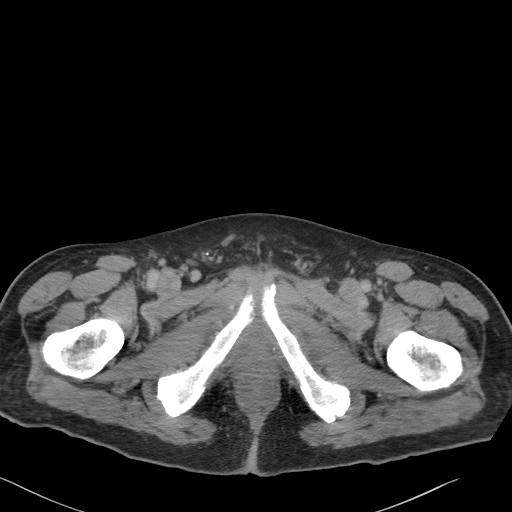
[im 34/73  soft-tissue]
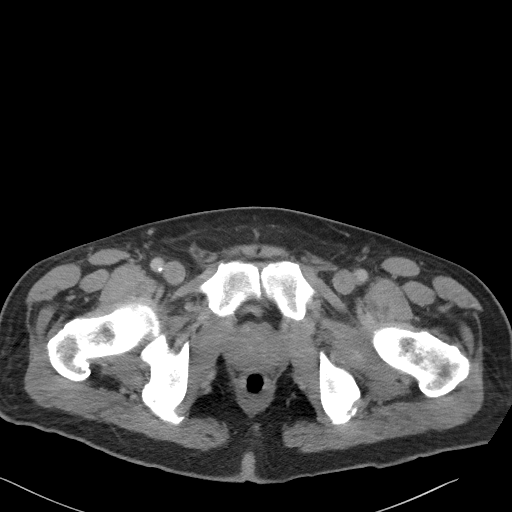
[im 39/73  soft-tissue]
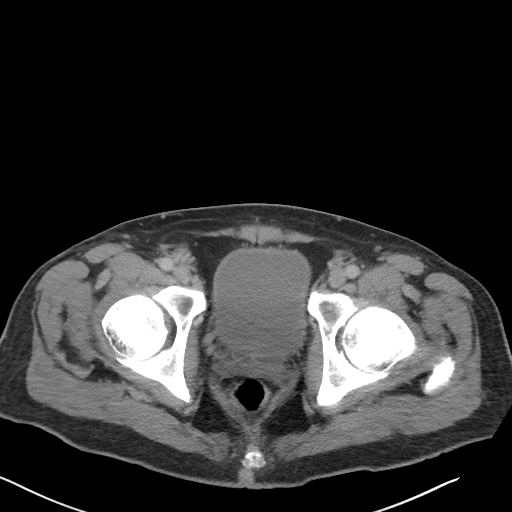
[im 44/73  soft-tissue]
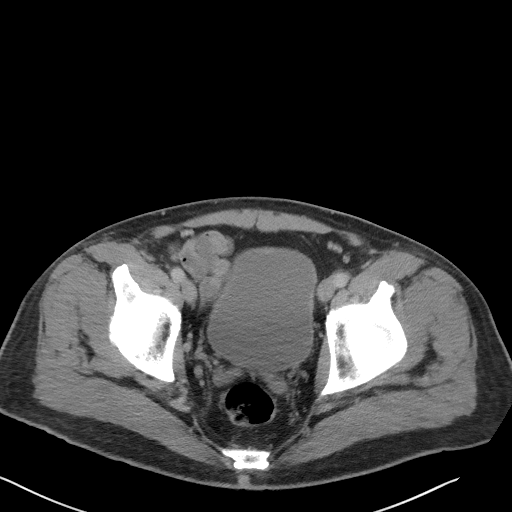
[im 44/73  bone]
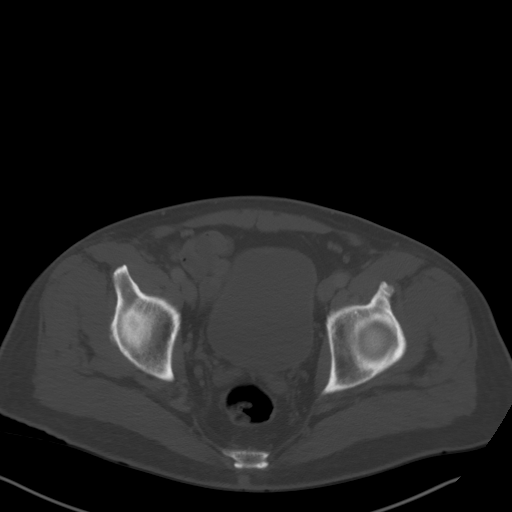
[im 49/73  soft-tissue]
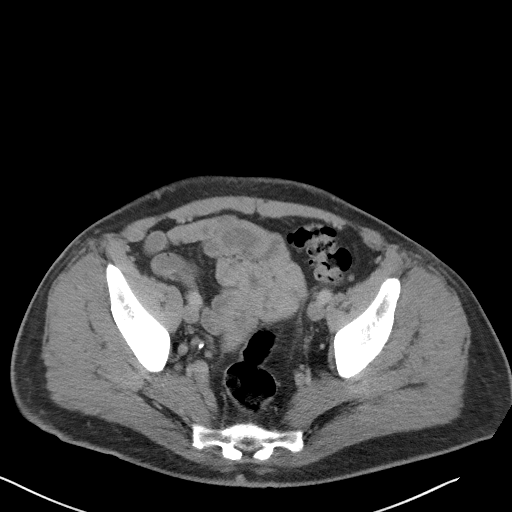
[im 53/73  soft-tissue]
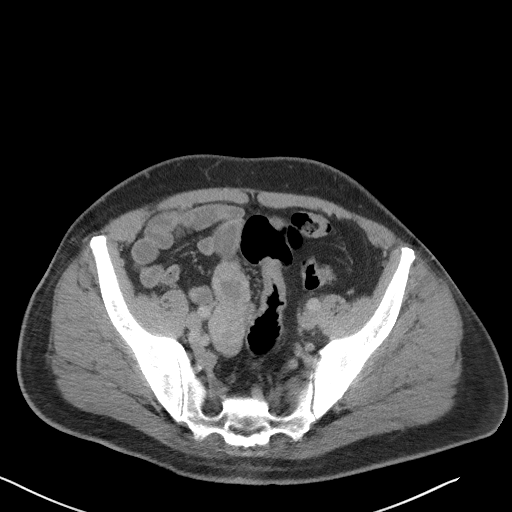
[im 58/73  soft-tissue]
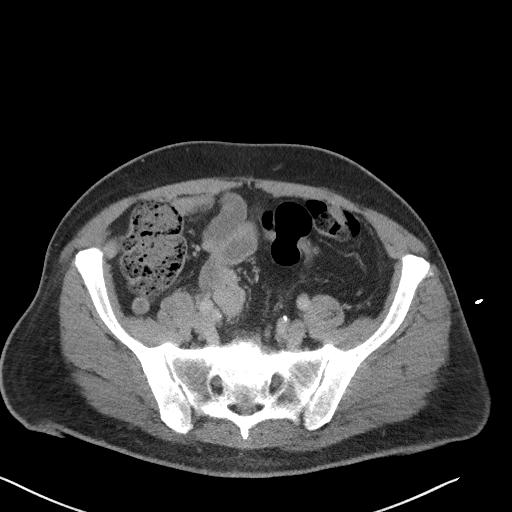
[im 63/73  soft-tissue]
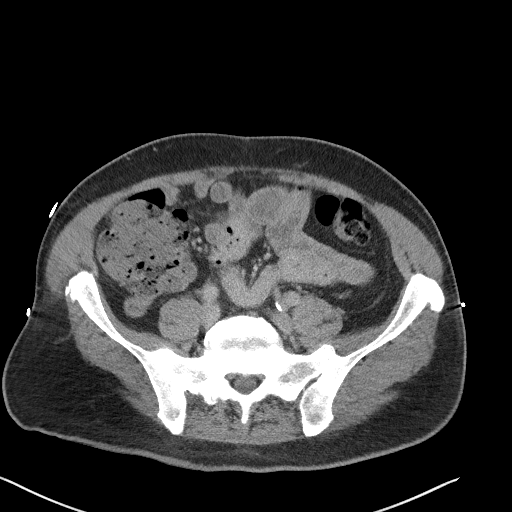
[im 68/73  soft-tissue]
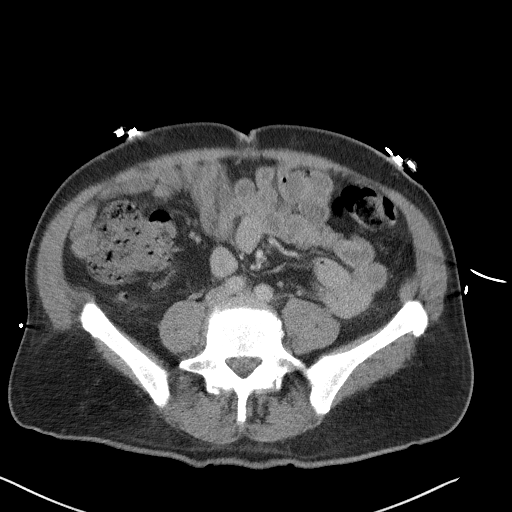

[Series 4: coronal st · coronal · 0.72mm/px · 3 of 96 slices shown]
[im 32/96  soft-tissue]
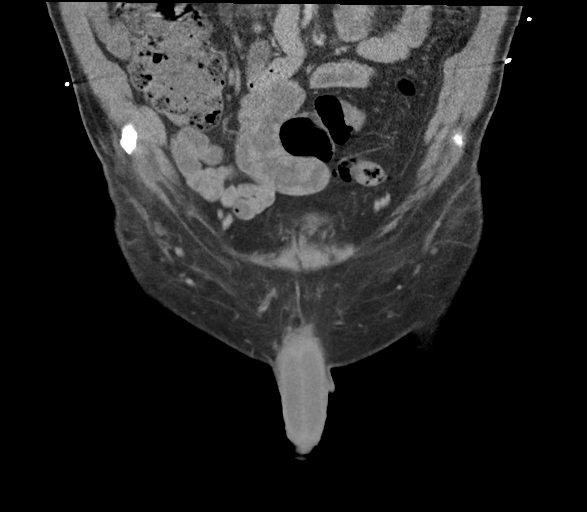
[im 43/96  soft-tissue]
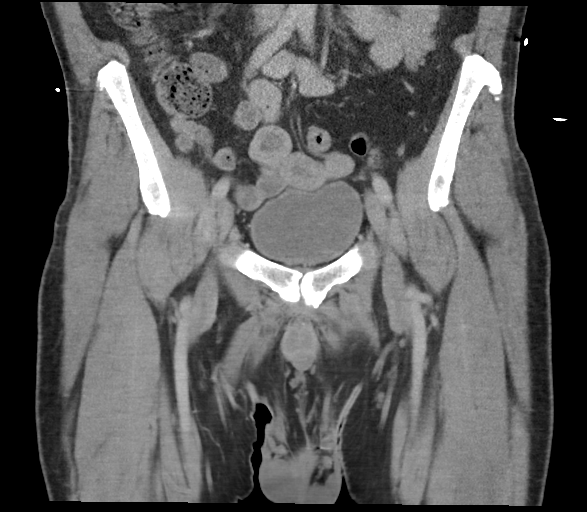
[im 53/96  soft-tissue]
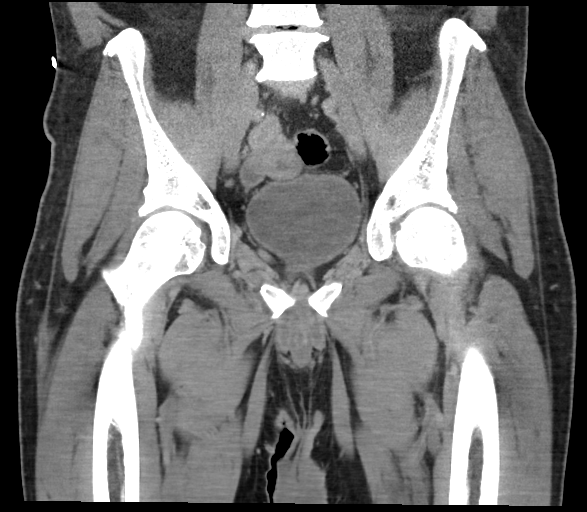

[17 of 46 positions shown; findings below may reference images not displayed]

FINDINGS: Urinary Tract:  No abnormality visualized.

Bowel: Unremarkable visualized pelvic bowel loops. Ureteral jet
noted within urinary bladder on the right.

Vascular/Lymphatic: Atherosclerotic plaque. No pathologically
enlarged lymph nodes. No significant vascular abnormality seen.

Reproductive: Prominent prostate. Otherwise no mass or other
significant abnormality

Other: No intraperitoneal free fluid. No intraperitoneal free gas.
No organized fluid collection.

Musculoskeletal: Medial right gluteal subcutaneus soft tissue edema
and several foci of gas with no definite organized fluid collection.
Edema noted to extend from the right perirectal region. No CT
findings of perineal or scrotal involvement. No suspicious bone
lesions identified.
IMPRESSION: 1. Right perirectal region edema extending to the medial right
gluteal subcutaneus soft tissues with several associated foci of
gas. No definite organized fluid collection. Please note necrotizing
fasciitis cannot be excluded as this is a clinical diagnosis.
2. Prominent prostate.
3.  Aortic Atherosclerosis ([L5]-[L5]).

## 2020-11-06 MED ORDER — LACTATED RINGERS IV SOLN: INTRAVENOUS | Status: DC

## 2020-11-06 MED ORDER — PIPERACILLIN-TAZOBACTAM 3.375 G IVPB 30 MIN
3.3750 g | Freq: Once | INTRAVENOUS | Status: AC
  Administered 2020-11-07: 3.375 g via INTRAVENOUS
  Filled 2020-11-06: qty 50

## 2020-11-06 MED ORDER — LACTATED RINGERS IV BOLUS
20.0000 mL/kg | Freq: Once | INTRAVENOUS | Status: AC
  Administered 2020-11-06: 1806 mL via INTRAVENOUS

## 2020-11-06 MED ORDER — VANCOMYCIN HCL 1750 MG/350ML IV SOLN
1750.0000 mg | Freq: Once | INTRAVENOUS | Status: AC
  Administered 2020-11-07: 1750 mg via INTRAVENOUS
  Filled 2020-11-06: qty 350

## 2020-11-06 MED ORDER — IOHEXOL 300 MG/ML  SOLN
75.0000 mL | Freq: Once | INTRAMUSCULAR | Status: AC | PRN
  Administered 2020-11-06: 75 mL via INTRAVENOUS

## 2020-11-06 MED ORDER — POTASSIUM CHLORIDE 10 MEQ/100ML IV SOLN
10.0000 meq | INTRAVENOUS | Status: AC
  Administered 2020-11-06: 10 meq via INTRAVENOUS
  Filled 2020-11-06: qty 100

## 2020-11-06 MED ORDER — DEXTROSE 50 % IV SOLN: 0.0000 mL | INTRAVENOUS | Status: DC | PRN

## 2020-11-06 MED ORDER — METRONIDAZOLE 500 MG/100ML IV SOLN
500.0000 mg | Freq: Once | INTRAVENOUS | Status: DC
  Filled 2020-11-06: qty 100

## 2020-11-06 MED ORDER — DEXTROSE IN LACTATED RINGERS 5 % IV SOLN: INTRAVENOUS | Status: DC

## 2020-11-06 MED ORDER — CLINDAMYCIN PHOSPHATE 900 MG/50ML IV SOLN
900.0000 mg | Freq: Once | INTRAVENOUS | Status: AC
  Administered 2020-11-07: 900 mg via INTRAVENOUS
  Filled 2020-11-06: qty 50

## 2020-11-06 MED ORDER — INSULIN REGULAR(HUMAN) IN NACL 100-0.9 UT/100ML-% IV SOLN
INTRAVENOUS | Status: DC
Start: 2020-11-06 — End: 2020-11-07
  Administered 2020-11-06: 13 [IU]/h via INTRAVENOUS
  Filled 2020-11-06: qty 100

## 2020-11-06 MED ORDER — CIPROFLOXACIN IN D5W 400 MG/200ML IV SOLN
400.0000 mg | Freq: Once | INTRAVENOUS | Status: DC
  Filled 2020-11-06: qty 200

## 2020-11-06 NOTE — ED Notes (Signed)
Patient transported to CT 

## 2020-11-06 NOTE — Telephone Encounter (Signed)
Stat met 7 is ready for review in epic

## 2020-11-06 NOTE — Progress Notes (Signed)
   Subjective:    Patient ID: Alexander Banks, male    DOB: [birth year], 65 y.o.   MRN: [id number]  HPIFollow up ED visit for hyperglycemia.  Patient recently has had increased thirst urination along with weight loss over the past couple months.  In addition to this patient had perirectal abscess.  Patient went for routine blood work on Friday morning which came back showing severely elevated glucose along with lower bicarb.  He was referred to the emergency department for which they did do IV fluids IV insulin and let him go later that evening.  Since then his sugars have been running in the upper 300-400 region.  We have been doing Levemir as well as short acting insulin without much leeway  Patient states he feels somewhat better today but has poor appetite and fatigue and tiredness Review of Systems     Objective:   Physical Exam Lungs are clear heart regular pulse normal weight is noted  Perirectal abscess noted with drainage     Assessment & Plan:   Poorly controlled diabetes Very important to go forward with increased amounts of insulin Sliding scale was discussed with patient Increase Levemir to 20 units each evening Use 16 yesterday with minimal response Patient with rectal abscess We will go ahead and do stat lab work because of renal insufficiency and also make sure that he is not showing any signs of DKA Referral to endocrinology Hold off on blood pressure medicines currently  Also with his increase fatigue and poor response so far to insulin we will repeat lab work stat at the hospital  Beta hydroxybutyrate acid level came back significantly elevated.  Metabolic 7 also shows significant increase of anion gap as well as creatinine still being elevated above baseline.  I spoke with Dr.Nida.  He stated that this is indication that the patient is still in DKA recommends referral back to the ER for admission IV insulin IV fluids as well as IV antibiotics.  Would also be  wise to have perirectal abscess looked at closer.  We will resume care upon discharge as well as have patient be seen by endocrinology.  ER physician was spoken with.  Patient spoke with.  Will follow closely.

## 2020-11-06 NOTE — ED Provider Notes (Signed)
  Provider Note MRN:  [id number]  Arrival date & time: 11/06/20    ED Course and Medical Decision Making  Assumed care from Dr. Regenia Skeeter at shift change.  Patient is in DKA, possibly triggered by perirectal/perianal abscess.  Plan is to admit to hospitalist, but first will discuss abscess management with general surgery.  CT with noted gas, phlegmon, cannot exclude necrotizing infection per radiology.  I personally evaluated the wound and there are no hard signs of necrotizing infection, there is some erythema and induration.  Discussed CT and exam with Dr. Constance Haw, given duration of symptoms patient is appropriate for medicine admission and general surgery will evaluate in the morning.  .Critical Care  Date/Time: 11/07/2020 7:50 AM Performed by: Maudie Flakes, MD Authorized by: Maudie Flakes, MD   Critical care provider statement:    Critical care time (minutes):  45   Critical care was necessary to treat or prevent imminent or life-threatening deterioration of the following conditions:  Metabolic crisis (DKA)   Critical care was time spent personally by me on the following activities:  Discussions with consultants, evaluation of patient's response to treatment, examination of patient, ordering and performing treatments and interventions, ordering and review of laboratory studies, ordering and review of radiographic studies, pulse oximetry, re-evaluation of patient's condition, obtaining history from patient or surrogate and review of old charts  Final Clinical Impressions(s) / ED Diagnoses     ICD-10-CM   1. Diabetic ketoacidosis without coma associated with type 2 diabetes mellitus ([place])  E11.10       ED Discharge Orders     None       Discharge Instructions   None     Barth Kirks. Sedonia Small, [place] [email or web address]    Maudie Flakes, MD 11/07/20 [phone]

## 2020-11-06 NOTE — ED Provider Notes (Signed)
Emergency Medicine Provider Triage Evaluation Note  Alexander Banks , a 64 y.o. male  was evaluated in triage.  Pt complains of hyperglycemia.  Review of Systems  Positive: Increased thirst, buttocks abscess Negative: Fever  Physical Exam  BP 121/71 (BP Location: Left Arm)   Pulse 88   Temp 99.1 F (37.3 C) (Oral)   Ht 6\' 3"  (1.905 m)   Wt 90.3 kg   SpO2 98%   BMI 24.87 kg/m  Gen:   Awake, no distress  Resp:  Normal effort  MSK:   Moves extremities without difficulty   Medical Decision Making  Medically screening exam initiated at 7:02 PM.  Appropriate orders placed.  [name] was informed that the remainder of the evaluation will be completed by another provider, this initial triage assessment does not replace that evaluation, and the importance of remaining in the ED until their evaluation is complete.  Outpatient labs consistent with DKA.  We will add on blood gas and get order set up for insulin drip.   Sherwood Gambler, MD 11/06/20 [phone]

## 2020-11-06 NOTE — ED Triage Notes (Signed)
States he was advised to come to the hospital for control of blood sugar

## 2020-11-06 NOTE — ED Provider Notes (Signed)
[facility] EMERGENCY DEPARTMENT Provider Note   CSN: [id number] Arrival date & time: 11/06/20  1737     History Chief Complaint  Patient presents with   Hyperglycemia    Alexander Banks is a 65 y.o. male.  HPI 65 year old male presents with hyperglycemia and DKA.  He was sent over by his PCP.  He has been dealing with hyperglycemia for quite some time, multiple months.  Recently had his A1c checked and it was still high and he has been dealing with a buttocks abscess for about 10 days.  Has been to urgent care as all as well as the ER couple days ago.  He has been on antibiotics over the last 3 days, Augmentin and metronidazole.  This seems to be helping.  It has been draining for about a week.  There is discomfort to his right buttocks.  No fevers or systemic symptoms.  Was seen in the ED a couple days ago and treated with fluids and insulin but still hyperglycemic and his doctor check labs today that showed DKA so he was sent to the ED.  Past Medical History:  Diagnosis Date   Anemia, iron deficiency    Diabetes mellitus without complication ([place])    on meds   Hearing loss    Bil hearing aids   Hyperlipidemia    Hypertension    Prediabetes    Sleep apnea     Patient Active Problem List   Diagnosis Date Noted   Hypothyroidism 09/27/2014   Prediabetes 12/07/2012   Obstructive sleep apnea 12/07/2012   Obesity 12/07/2012   Osteoarthritis 12/07/2012   Hyperlipidemia 10/19/2012   Essential hypertension, benign 10/19/2012    Past Surgical History:  Procedure Laterality Date   COLONOSCOPY     GIVENS CAPSULE STUDY     KNEE SURGERY     4 surgeries on left, 2 on right knee   SHOULDER SURGERY     right shoulder       Family History  Problem Relation Age of Onset   Heart attack Father    Heart attack Paternal Grandfather    Leukemia Sister    Heart attack Brother     Social History   Tobacco Use   Smoking status: Every Day    Pack years: 0.00    Types:  Cigars   Smokeless tobacco: Never   Tobacco comments:    Smoke 3-4 cigars a day  Vaping Use   Vaping Use: Never used  Substance Use Topics   Alcohol use: Yes    Comment: occasional   Drug use: Never    Home Medications Prior to Admission medications   Medication Sig Start Date End Date Taking? Authorizing Provider  amoxicillin-clavulanate (AUGMENTIN) 875-125 MG tablet Take 1 tablet by mouth every 12 (twelve) hours for 7 days. 11/03/20 11/10/20  Wurst, [place], PA-C  glucose blood (ONETOUCH VERIO) test strip TEST ONE TIME DAILY 11/30/19   [name], MD  insulin detemir (LEVEMIR) 100 UNIT/ML FlexPen 8 units may taper up to 30 units upon direction of physician 11/04/20   [name], MD  insulin lispro (HUMALOG KWIKPEN) 100 UNIT/ML KwikPen 5 units with lunch and dinner as directed by physician 11/04/20   [name], MD  levothyroxine (SYNTHROID) 175 MCG tablet TAKE 1 qd 07/25/20   [name], MD  metroNIDAZOLE (FLAGYL) 500 MG tablet Take 1 tablet (500 mg total) by mouth 2 (two) times daily. 11/03/20   Wurst, [place], PA-C  [employer] [place]  Lancets 33G MISC TEST ONE TIME DAILY 11/30/19   [name], MD  rosuvastatin (CRESTOR) 40 MG tablet TAKE 1 TABLET DAILY (STOP ATORVASTATIN) 07/25/20   [name], MD    Allergies    Lisinopril  Review of Systems   Review of Systems  Constitutional:  Negative for fever.  Gastrointestinal:  Negative for abdominal pain.  Skin:  Positive for color change.  All other systems reviewed and are negative.  Physical Exam Updated Vital Signs BP (!) 144/68   Pulse 79   Temp 99.1 F (37.3 C) (Oral)   Resp 17   Ht 6\' 3"  (1.905 m)   Wt 90.3 kg   SpO2 97%   BMI 24.87 kg/m   Physical Exam Vitals and nursing note reviewed. Exam conducted with a chaperone present.  Constitutional:      Appearance: He is well-developed.  HENT:     Head: Normocephalic and atraumatic.     Right Ear: External ear normal.     Left Ear: External ear  normal.     Nose: Nose normal.  Eyes:     General:        Right eye: No discharge.        Left eye: No discharge.  Cardiovascular:     Rate and Rhythm: Normal rate and regular rhythm.     Heart sounds: Normal heart sounds.  Pulmonary:     Effort: Pulmonary effort is normal.     Breath sounds: Normal breath sounds.  Abdominal:     General: There is no distension.     Palpations: Abdomen is soft.     Tenderness: There is no abdominal tenderness.  Genitourinary:   Musculoskeletal:     Cervical back: Neck supple.  Skin:    General: Skin is warm and dry.  Neurological:     Mental Status: He is alert.  Psychiatric:        Mood and Affect: Mood is not anxious.    ED Results / Procedures / Treatments   Labs (all labs ordered are listed, but only abnormal results are displayed) Labs Reviewed  BASIC METABOLIC PANEL - Abnormal; Notable for the following components:      Result Value   Sodium 130 (*)    CO2 21 (*)    Glucose, Bld 465 (*)    BUN 25 (*)    Creatinine, Ser 1.59 (*)    GFR, Estimated 48 (*)    All other components within normal limits  BASIC METABOLIC PANEL - Abnormal; Notable for the following components:   Sodium 128 (*)    CO2 19 (*)    Glucose, Bld 476 (*)    BUN 25 (*)    Creatinine, Ser 1.66 (*)    GFR, Estimated 46 (*)    All other components within normal limits  BETA-HYDROXYBUTYRIC ACID - Abnormal; Notable for the following components:   Beta-Hydroxybutyric Acid 1.89 (*)    All other components within normal limits  CBC WITH DIFFERENTIAL/PLATELET - Abnormal; Notable for the following components:   RBC 3.63 (*)    Hemoglobin 12.2 (*)    HCT 36.2 (*)    All other components within normal limits  BLOOD GAS, VENOUS - Abnormal; Notable for the following components:   pCO2, Ven 37.4 (*)    Bicarbonate 18.2 (*)    Acid-base deficit 6.8 (*)    All other components within normal limits  CBG MONITORING, ED - Abnormal; Notable for the following  components:  Glucose-Capillary 446 (*)    All other components within normal limits  CBG MONITORING, ED - Abnormal; Notable for the following components:   Glucose-Capillary 391 (*)    All other components within normal limits  RESP PANEL BY RT-PCR (FLU A&B, COVID) ARPGX2  BASIC METABOLIC PANEL  BASIC METABOLIC PANEL  BETA-HYDROXYBUTYRIC ACID  URINALYSIS, ROUTINE W REFLEX MICROSCOPIC  BASIC METABOLIC PANEL  BETA-HYDROXYBUTYRIC ACID    EKG None  Radiology CT PELVIS W CONTRAST  Result Date: 11/06/2020 CLINICAL DATA:  Anal or rectal abscess. Pt. Admitted for blood sugar problems. Pt. Has known rectal abscess. EXAM: CT PELVIS WITH CONTRAST TECHNIQUE: Multidetector CT imaging of the pelvis was performed using the standard protocol following the bolus administration of intravenous contrast. CONTRAST:  60mL OMNIPAQUE IOHEXOL 300 MG/ML  SOLN COMPARISON:  None. FINDINGS: Urinary Tract:  No abnormality visualized. Bowel: Unremarkable visualized pelvic bowel loops. Ureteral jet noted within urinary bladder on the right. Vascular/Lymphatic: Atherosclerotic plaque. No pathologically enlarged lymph nodes. No significant vascular abnormality seen. Reproductive: Prominent prostate. Otherwise no mass or other significant abnormality Other: No intraperitoneal free fluid. No intraperitoneal free gas. No organized fluid collection. Musculoskeletal: Medial right gluteal subcutaneus soft tissue edema and several foci of gas with no definite organized fluid collection. Edema noted to extend from the right perirectal region. No CT findings of perineal or scrotal involvement. No suspicious bone lesions identified. IMPRESSION: 1. Right perirectal region edema extending to the medial right gluteal subcutaneus soft tissues with several associated foci of gas. No definite organized fluid collection. Please note necrotizing fasciitis cannot be excluded as this is a clinical diagnosis. 2. Prominent prostate. 3.  Aortic  Atherosclerosis (ICD10-I70.0). Electronically Signed   By: Iven Finn M.D.   On: 11/06/2020 23:35    Procedures .Critical Care  Date/Time: 11/06/2020 11:40 PM Performed by: Sherwood Gambler, MD Authorized by: Sherwood Gambler, MD   Critical care provider statement:    Critical care time (minutes):  35   Critical care was necessary to treat or prevent imminent or life-threatening deterioration of the following conditions:  Endocrine crisis   Critical care was time spent personally by me on the following activities:  Discussions with consultants, evaluation of patient's response to treatment, examination of patient, ordering and performing treatments and interventions, ordering and review of laboratory studies, ordering and review of radiographic studies, pulse oximetry, re-evaluation of patient's condition, obtaining history from patient or surrogate and review of old charts   Medications Ordered in ED Medications  insulin regular, human (MYXREDLIN) 100 units/ 100 mL infusion (13 Units/hr Intravenous New Bag/Given 11/06/20 2253)  lactated ringers infusion (has no administration in time range)  dextrose 5 % in lactated ringers infusion (0 mLs Intravenous Hold 11/06/20 2227)  dextrose 50 % solution 0-50 mL (has no administration in time range)  potassium chloride 10 mEq in 100 mL IVPB (10 mEq Intravenous New Bag/Given 11/06/20 2242)  ciprofloxacin (CIPRO) IVPB 400 mg (has no administration in time range)  metroNIDAZOLE (FLAGYL) IVPB 500 mg (has no administration in time range)  lactated ringers bolus 1,806 mL (1,806 mLs Intravenous New Bag/Given 11/06/20 2309)  iohexol (OMNIPAQUE) 300 MG/ML solution 75 mL (75 mLs Intravenous Contrast Given 11/06/20 2309)    ED Course  I have reviewed the triage vital signs and the nursing notes.  Pertinent labs & imaging results that were available during my care of the patient were reviewed by me and considered in my medical decision making (see chart for  details).  MDM Rules/Calculators/A&P                          Patient sent from PCPs office with continued buttock abscess as well as DKA.  Patient's vitals are okay but his labs are consistent with mild DKA.  Given he did not improve after last ED visit we will admit and put on insulin drip.  Given potassium and fluids.  CT will be obtained to help further delineate what this infection is but will probably need OR drainage.  Discussed with Dr. Bryn Gulling, who requests reconsult after CT and surgery consult. Care to Dr. Sedonia Small. Final Clinical Impression(s) / ED Diagnoses Final diagnoses:  Diabetic ketoacidosis without coma associated with type 2 diabetes mellitus [facility])    Rx / DC Orders ED Discharge Orders     None        Sherwood Gambler, MD 11/06/20 2341

## 2020-11-06 NOTE — Telephone Encounter (Signed)
Has appt today with dr Nicki Reaper at 11:40

## 2020-11-07 ENCOUNTER — Inpatient Hospital Stay (HOSPITAL_COMMUNITY): Payer: BC Managed Care – PPO

## 2020-11-07 ENCOUNTER — Other Ambulatory Visit: Payer: Self-pay

## 2020-11-07 DIAGNOSIS — L0390 Cellulitis, unspecified: Secondary | ICD-10-CM

## 2020-11-07 DIAGNOSIS — D638 Anemia in other chronic diseases classified elsewhere: Secondary | ICD-10-CM

## 2020-11-07 DIAGNOSIS — K611 Rectal abscess: Secondary | ICD-10-CM

## 2020-11-07 DIAGNOSIS — G4733 Obstructive sleep apnea (adult) (pediatric): Secondary | ICD-10-CM | POA: Diagnosis present

## 2020-11-07 DIAGNOSIS — E0810 Diabetes mellitus due to underlying condition with ketoacidosis without coma: Secondary | ICD-10-CM

## 2020-11-07 DIAGNOSIS — Z806 Family history of leukemia: Secondary | ICD-10-CM | POA: Diagnosis not present

## 2020-11-07 DIAGNOSIS — Z20822 Contact with and (suspected) exposure to covid-19: Secondary | ICD-10-CM | POA: Diagnosis present

## 2020-11-07 DIAGNOSIS — I10 Essential (primary) hypertension: Secondary | ICD-10-CM

## 2020-11-07 DIAGNOSIS — L0231 Cutaneous abscess of buttock: Secondary | ICD-10-CM | POA: Diagnosis present

## 2020-11-07 DIAGNOSIS — E782 Mixed hyperlipidemia: Secondary | ICD-10-CM

## 2020-11-07 DIAGNOSIS — D539 Nutritional anemia, unspecified: Secondary | ICD-10-CM

## 2020-11-07 DIAGNOSIS — E785 Hyperlipidemia, unspecified: Secondary | ICD-10-CM | POA: Diagnosis present

## 2020-11-07 DIAGNOSIS — E039 Hypothyroidism, unspecified: Secondary | ICD-10-CM | POA: Diagnosis present

## 2020-11-07 DIAGNOSIS — K612 Anorectal abscess: Secondary | ICD-10-CM | POA: Diagnosis present

## 2020-11-07 DIAGNOSIS — H9193 Unspecified hearing loss, bilateral: Secondary | ICD-10-CM | POA: Diagnosis present

## 2020-11-07 DIAGNOSIS — E1110 Type 2 diabetes mellitus with ketoacidosis without coma: Secondary | ICD-10-CM | POA: Diagnosis present

## 2020-11-07 DIAGNOSIS — K8010 Calculus of gallbladder with chronic cholecystitis without obstruction: Secondary | ICD-10-CM | POA: Diagnosis present

## 2020-11-07 DIAGNOSIS — Z79899 Other long term (current) drug therapy: Secondary | ICD-10-CM | POA: Diagnosis not present

## 2020-11-07 DIAGNOSIS — Z8249 Family history of ischemic heart disease and other diseases of the circulatory system: Secondary | ICD-10-CM | POA: Diagnosis not present

## 2020-11-07 DIAGNOSIS — K811 Chronic cholecystitis: Secondary | ICD-10-CM | POA: Diagnosis not present

## 2020-11-07 DIAGNOSIS — C259 Malignant neoplasm of pancreas, unspecified: Secondary | ICD-10-CM | POA: Diagnosis present

## 2020-11-07 DIAGNOSIS — Z888 Allergy status to other drugs, medicaments and biological substances status: Secondary | ICD-10-CM | POA: Diagnosis not present

## 2020-11-07 DIAGNOSIS — R7989 Other specified abnormal findings of blood chemistry: Secondary | ICD-10-CM | POA: Diagnosis not present

## 2020-11-07 DIAGNOSIS — Z794 Long term (current) use of insulin: Secondary | ICD-10-CM | POA: Diagnosis not present

## 2020-11-07 DIAGNOSIS — E119 Type 2 diabetes mellitus without complications: Secondary | ICD-10-CM

## 2020-11-07 DIAGNOSIS — E860 Dehydration: Secondary | ICD-10-CM

## 2020-11-07 DIAGNOSIS — E1165 Type 2 diabetes mellitus with hyperglycemia: Secondary | ICD-10-CM

## 2020-11-07 DIAGNOSIS — L03317 Cellulitis of buttock: Secondary | ICD-10-CM | POA: Diagnosis not present

## 2020-11-07 DIAGNOSIS — K8689 Other specified diseases of pancreas: Secondary | ICD-10-CM | POA: Diagnosis not present

## 2020-11-07 DIAGNOSIS — Z974 Presence of external hearing-aid: Secondary | ICD-10-CM | POA: Diagnosis not present

## 2020-11-07 DIAGNOSIS — Z79890 Hormone replacement therapy (postmenopausal): Secondary | ICD-10-CM | POA: Diagnosis not present

## 2020-11-07 DIAGNOSIS — E871 Hypo-osmolality and hyponatremia: Secondary | ICD-10-CM

## 2020-11-07 DIAGNOSIS — F17290 Nicotine dependence, other tobacco product, uncomplicated: Secondary | ICD-10-CM | POA: Diagnosis present

## 2020-11-07 DIAGNOSIS — N179 Acute kidney failure, unspecified: Secondary | ICD-10-CM

## 2020-11-07 DIAGNOSIS — K831 Obstruction of bile duct: Secondary | ICD-10-CM | POA: Diagnosis not present

## 2020-11-07 LAB — COMPREHENSIVE METABOLIC PANEL
ALT: 281 U/L — ABNORMAL HIGH (ref 0–44)
AST: 434 U/L — ABNORMAL HIGH (ref 15–41)
Albumin: 2.8 g/dL — ABNORMAL LOW (ref 3.5–5.0)
Alkaline Phosphatase: 596 U/L — ABNORMAL HIGH (ref 38–126)
Anion gap: 7 (ref 5–15)
BUN: 20 mg/dL (ref 8–23)
CO2: 23 mmol/L (ref 22–32)
Calcium: 8.8 mg/dL — ABNORMAL LOW (ref 8.9–10.3)
Chloride: 105 mmol/L (ref 98–111)
Creatinine, Ser: 1.42 mg/dL — ABNORMAL HIGH (ref 0.61–1.24)
GFR, Estimated: 56 mL/min — ABNORMAL LOW (ref >60)
Glucose, Bld: 156 mg/dL — ABNORMAL HIGH (ref 70–99)
Potassium: 3.5 mmol/L (ref 3.5–5.1)
Sodium: 135 mmol/L (ref 135–145)
Total Bilirubin: 1.7 mg/dL — ABNORMAL HIGH (ref 0.3–1.2)
Total Protein: 6.1 g/dL — ABNORMAL LOW (ref 6.5–8.1)

## 2020-11-07 LAB — URINALYSIS, ROUTINE W REFLEX MICROSCOPIC
Bacteria, UA: NONE SEEN
Bilirubin Urine: NEGATIVE
Glucose, UA: >=500 mg/dL — AB
Ketones, ur: 20 mg/dL — AB
Leukocytes,Ua: NEGATIVE
Nitrite: NEGATIVE
Protein, ur: 30 mg/dL — AB
Specific Gravity, Urine: 1.027 (ref 1.005–1.030)
pH: 5 (ref 5.0–8.0)

## 2020-11-07 LAB — BASIC METABOLIC PANEL
Anion gap: 12 (ref 5–15)
Anion gap: 8 (ref 5–15)
Anion gap: 6 (ref 5–15)
BUN/Creatinine Ratio: 18 (ref 10–24)
BUN: 38 mg/dL — ABNORMAL HIGH (ref 8–27)
BUN: 21 mg/dL (ref 8–23)
BUN: 20 mg/dL (ref 8–23)
BUN: 18 mg/dL (ref 8–23)
CO2: 16 mmol/L — ABNORMAL LOW (ref 20–29)
CO2: 22 mmol/L (ref 22–32)
CO2: 23 mmol/L (ref 22–32)
CO2: 24 mmol/L (ref 22–32)
Calcium: 9.2 mg/dL (ref 8.6–10.2)
Calcium: 9 mg/dL (ref 8.9–10.3)
Calcium: 8.7 mg/dL — ABNORMAL LOW (ref 8.9–10.3)
Calcium: 8.6 mg/dL — ABNORMAL LOW (ref 8.9–10.3)
Chloride: 96 mmol/L (ref 96–106)
Chloride: 100 mmol/L (ref 98–111)
Chloride: 105 mmol/L (ref 98–111)
Chloride: 105 mmol/L (ref 98–111)
Creatinine, Ser: 2.09 mg/dL — ABNORMAL HIGH (ref 0.76–1.27)
Creatinine, Ser: 1.38 mg/dL — ABNORMAL HIGH (ref 0.61–1.24)
Creatinine, Ser: 1.44 mg/dL — ABNORMAL HIGH (ref 0.61–1.24)
Creatinine, Ser: 1.45 mg/dL — ABNORMAL HIGH (ref 0.61–1.24)
GFR, Estimated: 57 mL/min — ABNORMAL LOW (ref >60)
GFR, Estimated: 55 mL/min — ABNORMAL LOW (ref >60)
GFR, Estimated: 54 mL/min — ABNORMAL LOW (ref >60)
Glucose, Bld: 148 mg/dL — ABNORMAL HIGH (ref 70–99)
Glucose, Bld: 151 mg/dL — ABNORMAL HIGH (ref 70–99)
Glucose, Bld: 171 mg/dL — ABNORMAL HIGH (ref 70–99)
Glucose: 502 mg/dL (ref 65–99)
Potassium: 5.2 mmol/L (ref 3.5–5.2)
Potassium: 3.6 mmol/L (ref 3.5–5.1)
Potassium: 3.5 mmol/L (ref 3.5–5.1)
Potassium: 3.6 mmol/L (ref 3.5–5.1)
Sodium: 132 mmol/L — ABNORMAL LOW (ref 134–144)
Sodium: 134 mmol/L — ABNORMAL LOW (ref 135–145)
Sodium: 136 mmol/L (ref 135–145)
Sodium: 135 mmol/L (ref 135–145)
eGFR: 35 mL/min/{1.73_m2} — ABNORMAL LOW (ref >59)

## 2020-11-07 LAB — HEMOGLOBIN A1C
Est. average glucose Bld gHb Est-mCnc: 312 mg/dL
Hgb A1c MFr Bld: 12.5 % — ABNORMAL HIGH (ref 4.8–5.6)

## 2020-11-07 LAB — GLUCOSE, CAPILLARY
Glucose-Capillary: 234 mg/dL — ABNORMAL HIGH (ref 70–99)
Glucose-Capillary: 183 mg/dL — ABNORMAL HIGH (ref 70–99)
Glucose-Capillary: 177 mg/dL — ABNORMAL HIGH (ref 70–99)
Glucose-Capillary: 164 mg/dL — ABNORMAL HIGH (ref 70–99)
Glucose-Capillary: 171 mg/dL — ABNORMAL HIGH (ref 70–99)
Glucose-Capillary: 183 mg/dL — ABNORMAL HIGH (ref 70–99)
Glucose-Capillary: 175 mg/dL — ABNORMAL HIGH (ref 70–99)
Glucose-Capillary: 154 mg/dL — ABNORMAL HIGH (ref 70–99)
Glucose-Capillary: 184 mg/dL — ABNORMAL HIGH (ref 70–99)
Glucose-Capillary: 157 mg/dL — ABNORMAL HIGH (ref 70–99)
Glucose-Capillary: 155 mg/dL — ABNORMAL HIGH (ref 70–99)
Glucose-Capillary: 301 mg/dL — ABNORMAL HIGH (ref 70–99)
Glucose-Capillary: 292 mg/dL — ABNORMAL HIGH (ref 70–99)

## 2020-11-07 LAB — HEPATITIS C ANTIBODY: HCV Ab: NONREACTIVE

## 2020-11-07 LAB — PROTIME-INR
INR: 1 (ref 0.8–1.2)
Prothrombin Time: 13.1 seconds (ref 11.4–15.2)

## 2020-11-07 LAB — PSA: Prostate Specific Ag, Serum: 0.6 ng/mL (ref 0.0–4.0)

## 2020-11-07 LAB — CBC
HCT: 34.3 % — ABNORMAL LOW (ref 39.0–52.0)
Hemoglobin: 11.5 g/dL — ABNORMAL LOW (ref 13.0–17.0)
MCH: 33.2 pg (ref 26.0–34.0)
MCHC: 33.5 g/dL (ref 30.0–36.0)
MCV: 99.1 fL (ref 80.0–100.0)
Platelets: 396 10*3/uL (ref 150–400)
RBC: 3.46 MIL/uL — ABNORMAL LOW (ref 4.22–5.81)
RDW: 13.6 % (ref 11.5–15.5)
WBC: 5.6 10*3/uL (ref 4.0–10.5)
nRBC: 0 % (ref 0.0–0.2)

## 2020-11-07 LAB — TSH: TSH: 0.529 u[IU]/mL (ref 0.450–4.500)

## 2020-11-07 LAB — MRSA NEXT GEN BY PCR, NASAL: MRSA by PCR Next Gen: NOT DETECTED

## 2020-11-07 LAB — PHOSPHORUS: Phosphorus: 2.4 mg/dL — ABNORMAL LOW (ref 2.5–4.6)

## 2020-11-07 LAB — HIV ANTIBODY (ROUTINE TESTING W REFLEX): HIV Screen 4th Generation wRfx: NONREACTIVE

## 2020-11-07 LAB — HEPATITIS B SURFACE ANTIGEN: Hepatitis B Surface Ag: NONREACTIVE

## 2020-11-07 LAB — CBG MONITORING, ED: Glucose-Capillary: 310 mg/dL — ABNORMAL HIGH (ref 70–99)

## 2020-11-07 LAB — RESP PANEL BY RT-PCR (FLU A&B, COVID) ARPGX2
Influenza A by PCR: NEGATIVE
Influenza B by PCR: NEGATIVE
SARS Coronavirus 2 by RT PCR: NEGATIVE

## 2020-11-07 LAB — BETA-HYDROXYBUTYRIC ACID
Beta-Hydroxybutyric Acid: 0.27 mmol/L (ref 0.05–0.27)
Beta-Hydroxybutyric Acid: 0.17 mmol/L (ref 0.05–0.27)

## 2020-11-07 LAB — MAGNESIUM: Magnesium: 1.8 mg/dL (ref 1.7–2.4)

## 2020-11-07 LAB — MICROALBUMIN / CREATININE URINE RATIO
Creatinine, Urine: 93 mg/dL
Microalb/Creat Ratio: 278 mg/g creat — ABNORMAL HIGH (ref 0–29)
Microalbumin, Urine: 258.6 ug/mL

## 2020-11-07 LAB — APTT: aPTT: 27 seconds (ref 24–36)

## 2020-11-07 LAB — VITAMIN B12: Vitamin B-12: 236 pg/mL (ref 180–914)

## 2020-11-07 LAB — FOLATE: Folate: 11.2 ng/mL (ref >5.9)

## 2020-11-07 IMAGING — US US ABDOMEN LIMITED
1 series · 14 of 25 positions shown · non-contrast
Comparison: None.

CLINICAL DATA: Elevated LFTs.

EXAM:
ULTRASOUND ABDOMEN LIMITED RIGHT UPPER QUADRANT

[Series 1: us abdomen limited ruq (liver/gb) · 14 of 101 slices shown]
[im 1/101]
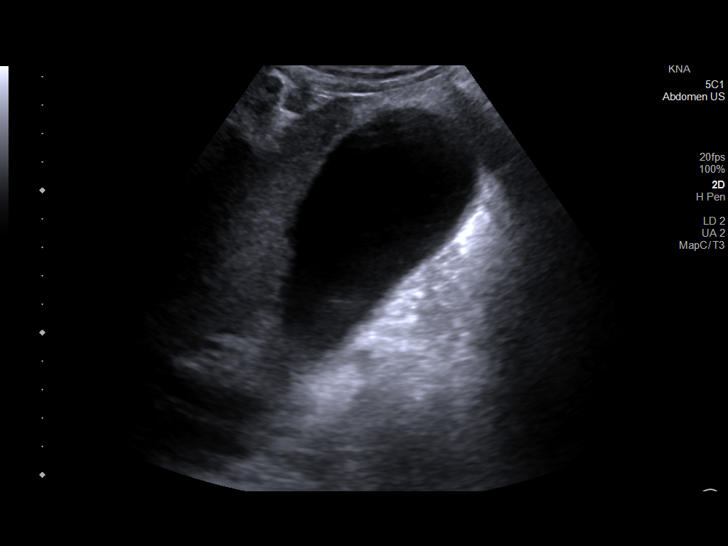
[im 9/101]
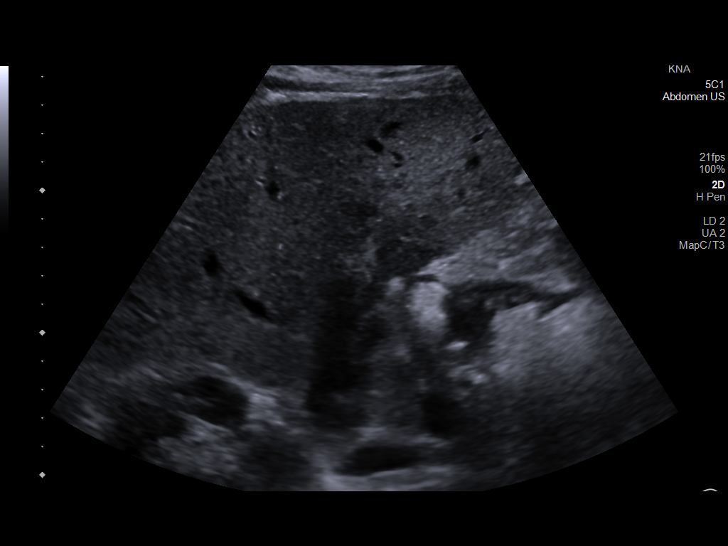
[im 17/101]
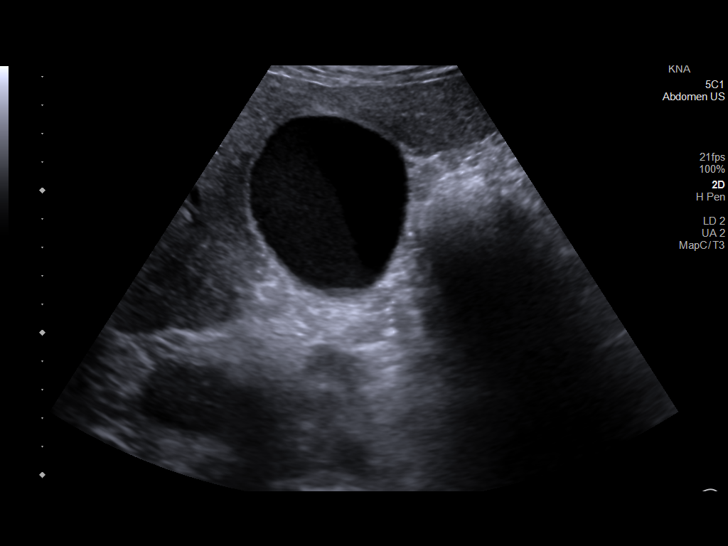
[im 26/101]
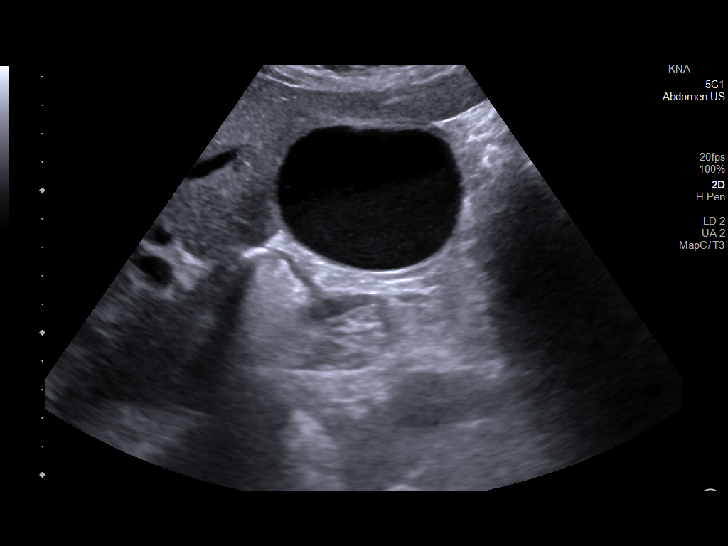
[im 34/101]
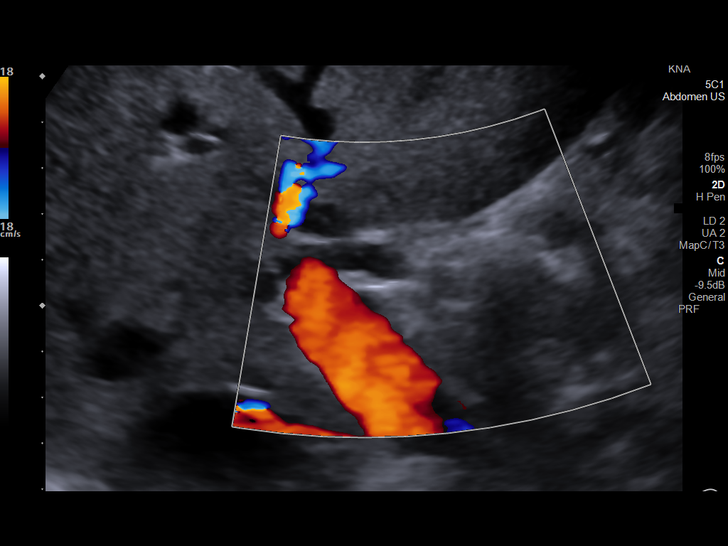
[im 38/101]
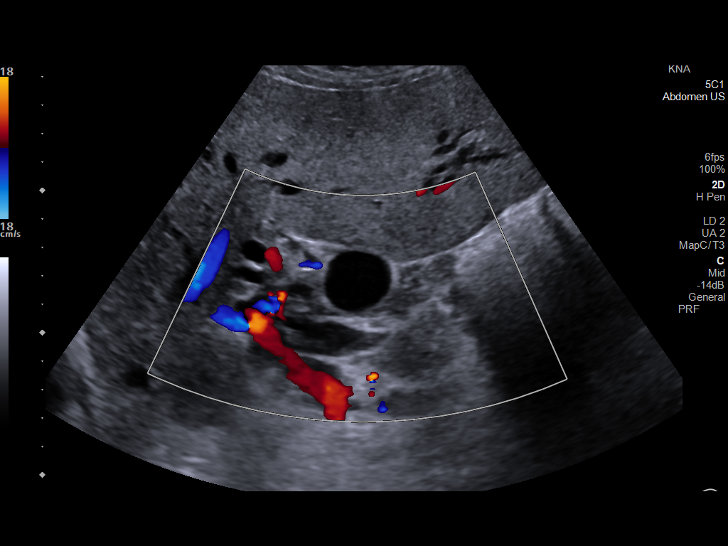
[im 46/101]
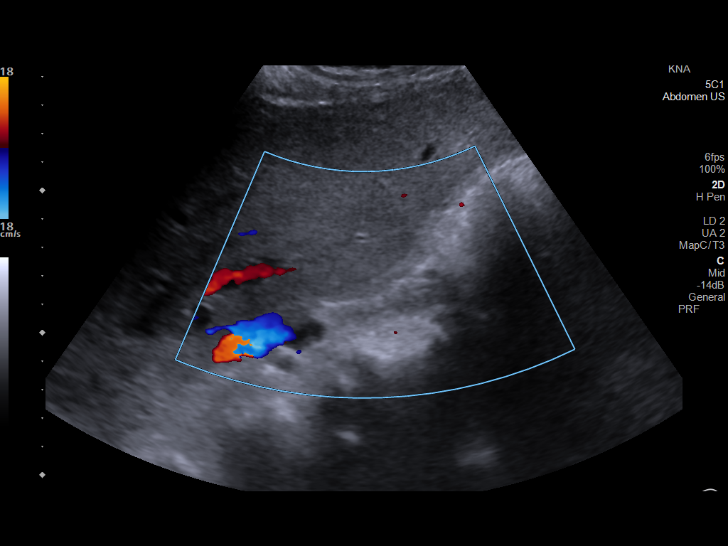
[im 55/101]
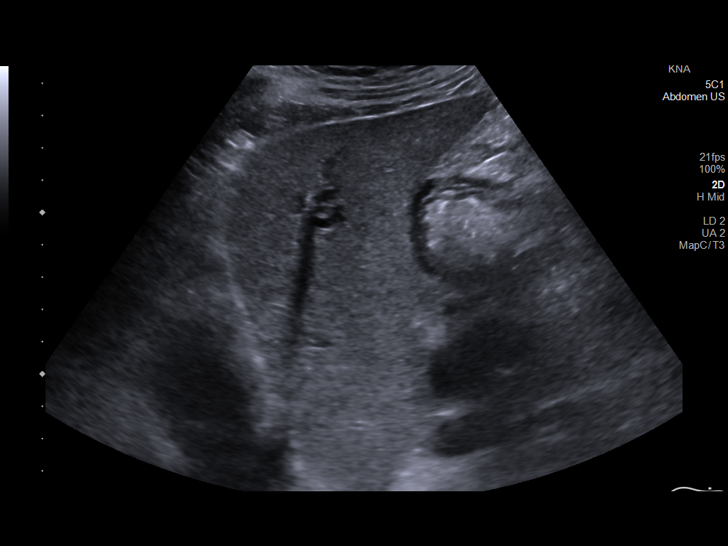
[im 63/101]
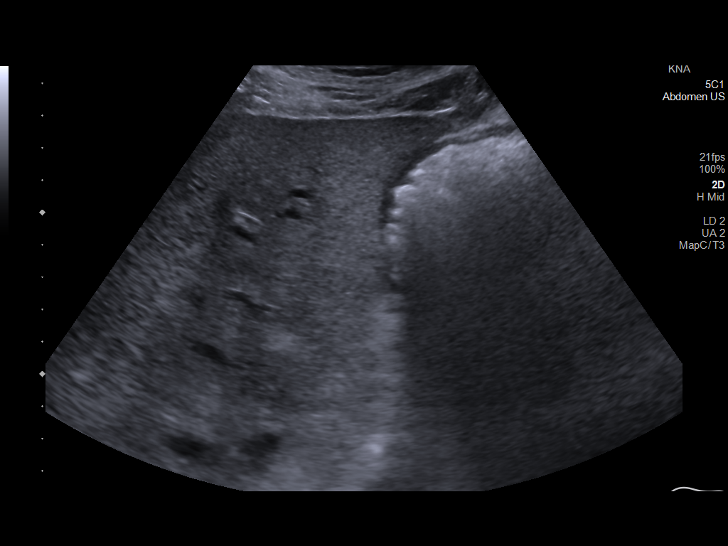
[im 67/101]
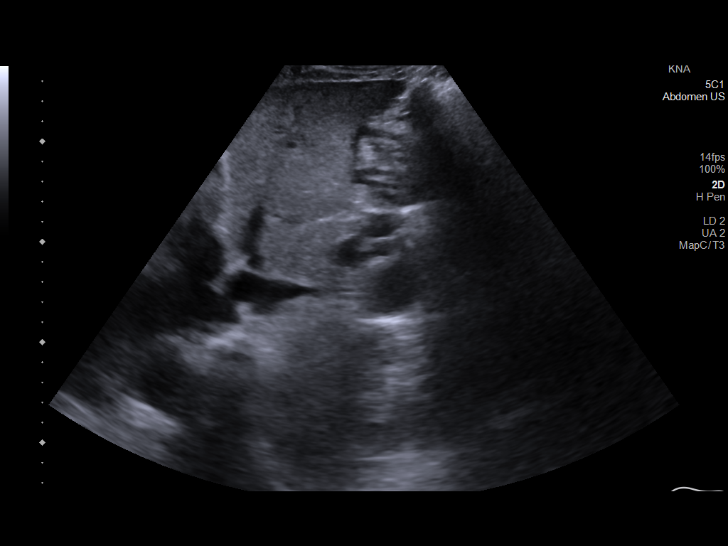
[im 76/101]
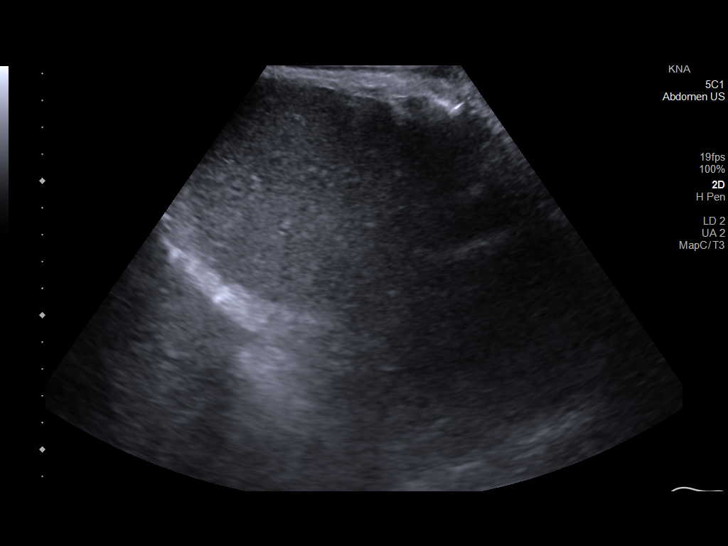
[im 84/101]
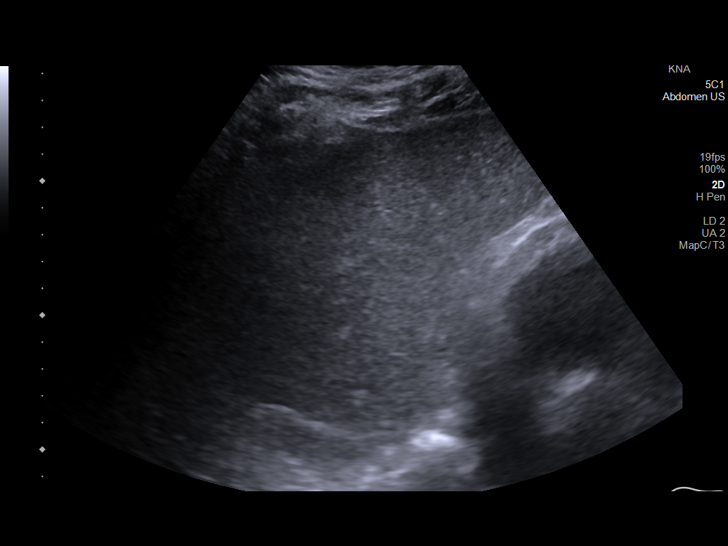
[im 92/101]
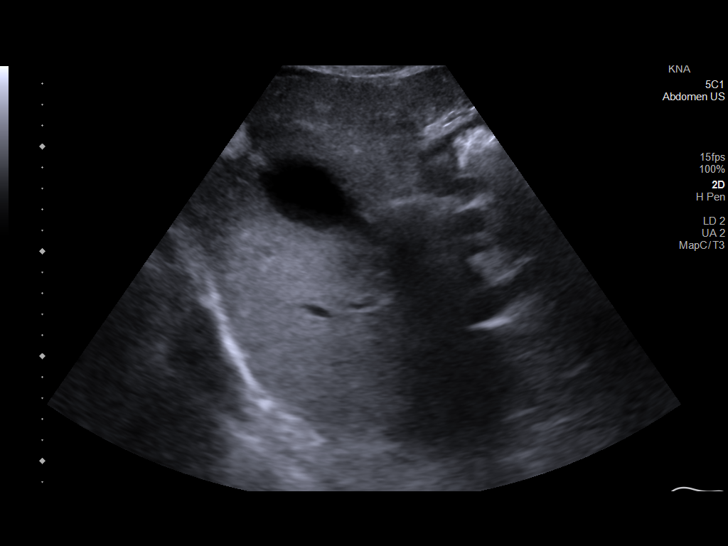
[im 101/101]
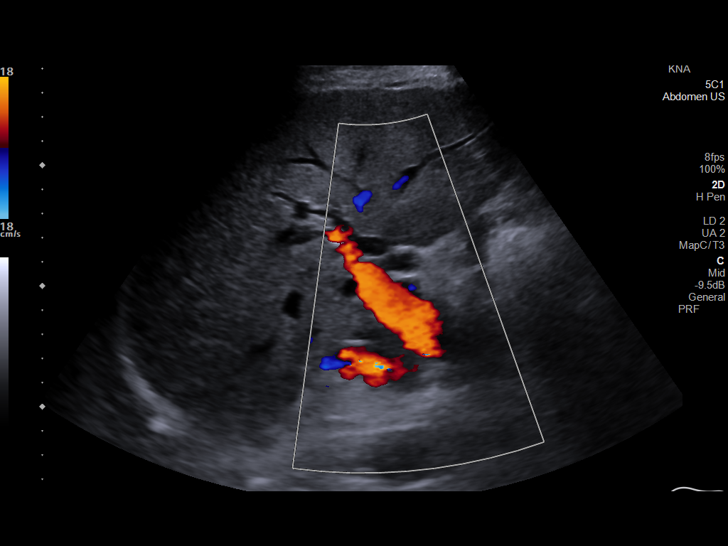

[14 of 25 positions shown; findings below may reference images not displayed]

FINDINGS: Gallbladder:

Gallbladder is distended with layering sludge. Gallbladder wall
thickness upper normal to mildly increased at 3-4 mm. No appreciable
pericholecystic fluid. The sonographer reports no sonographic Murphy
sign.

Common bile duct:

Diameter: Dilated up to 7-10 mm diameter.

Liver:

Increased echogenicity suggests fatty deposition. Mild intrahepatic
biliary duct dilatation evident. Portal vein is patent on color
Doppler imaging with normal direction of blood flow towards the
liver.

Other: None.
IMPRESSION: 1. Distended gallbladder with borderline to mild gallbladder wall
thickening. No gallstones evident by ultrasound. If there is
clinical concern for cystic duct obstruction, nuclear scintigraphy
may prove helpful to further evaluate. Given the biliary dilatation,
MRI/MRCP may also prove helpful.
2. Probable hepatic steatosis.

## 2020-11-07 MED ORDER — CHLORHEXIDINE GLUCONATE CLOTH 2 % EX PADS
6.0000 | MEDICATED_PAD | Freq: Every day | CUTANEOUS | Status: DC
  Administered 2020-11-07 – 2020-11-09 (×2): 6 via TOPICAL

## 2020-11-07 MED ORDER — INSULIN ASPART 100 UNIT/ML IJ SOLN
0.0000 [IU] | Freq: Every day | INTRAMUSCULAR | Status: DC
  Administered 2020-11-07: 3 [IU] via SUBCUTANEOUS
  Administered 2020-11-08: 4 [IU] via SUBCUTANEOUS
  Administered 2020-11-09: 3 [IU] via SUBCUTANEOUS

## 2020-11-07 MED ORDER — SODIUM CHLORIDE 0.9 % IV SOLN
2.0000 g | INTRAVENOUS | Status: DC
  Administered 2020-11-07 – 2020-11-08 (×2): 2 g via INTRAVENOUS
  Filled 2020-11-07 (×2): qty 20

## 2020-11-07 MED ORDER — VANCOMYCIN HCL IN DEXTROSE 1-5 GM/200ML-% IV SOLN
1000.0000 mg | Freq: Two times a day (BID) | INTRAVENOUS | Status: DC
  Administered 2020-11-07 – 2020-11-10 (×7): 1000 mg via INTRAVENOUS
  Filled 2020-11-07 (×7): qty 200

## 2020-11-07 MED ORDER — LACTATED RINGERS IV SOLN: INTRAVENOUS | Status: DC

## 2020-11-07 MED ORDER — INSULIN GLARGINE 100 UNIT/ML ~~LOC~~ SOLN
18.0000 [IU] | Freq: Every day | SUBCUTANEOUS | Status: DC
  Administered 2020-11-07: 18 [IU] via SUBCUTANEOUS
  Filled 2020-11-07 (×2): qty 0.18

## 2020-11-07 MED ORDER — VANCOMYCIN HCL IN DEXTROSE 1-5 GM/200ML-% IV SOLN: 1000.0000 mg | Freq: Once | INTRAVENOUS | Status: DC

## 2020-11-07 MED ORDER — INSULIN ASPART 100 UNIT/ML IJ SOLN
0.0000 [IU] | Freq: Three times a day (TID) | INTRAMUSCULAR | Status: DC
  Administered 2020-11-07: 11 [IU] via SUBCUTANEOUS
  Administered 2020-11-08: 8 [IU] via SUBCUTANEOUS
  Administered 2020-11-08: 15 [IU] via SUBCUTANEOUS
  Administered 2020-11-09 – 2020-11-10 (×3): 8 [IU] via SUBCUTANEOUS

## 2020-11-07 NOTE — Progress Notes (Signed)
PROGRESS NOTE  Alexander Banks:[id number] DOB: 1955/10/08 DOA: 11/06/2020 PCP: Alexander Drown, MD  Brief History:  65 y.o. male with medical history significant for T2DM, hypertension, hyperlipidemia, hypothyroidism who presents to the emergency department for evaluation of elevated blood glucose level.  Patient complained of 3-week onset of polydipsia and polyuria and weight loss.  He states that he has never been on insulin previously.  He was only on metformin up until 11/04/2020 when he first started Levemir. In addition, the patient states that he has had some tenderness and drainage from his buttock for about 2-1/2 weeks.  He states that he has been "milking" purulent drainage from his right buttocks for this period of time.  He states that the drainage has somewhat decreased over the past 2 to 3 days but he continues to have pain.  He has not had any fevers, chills, chest pain, shortness of breath, nausea, vomiting, diarrhea, domino pain.  The patient went to urgent care on 11/03/2020 where patient was prescribed with Augmentin and Flagyl and advised on conservative treatment.  He was seen in the ED on 6/18 where patient was suspected to have early onset of DKA, acute kidney injury and abscess of buttock.  IV hydration was provided, insulin was given and patient was advised to follow-up with his PCP.  He followed with his PCP yesterday (6/20), lab work done showed elevated beta hydroxybutyrate level as well as increased anion gap with creatinine being above baseline level, his PCP consulted with an endocrinologist who recommends that patient should go to an ED since patient was still in DKA. In the emergency department, he was hemodynamically stable.  Work-up in the ED showed macrocytic anemia, hyponatremia, BUN to creatinine 25/1.66 (baseline creatinine at 1.1-1.2), beta hydroxybutyric acid 3.86> 1.89, CBG 476.  Influenza A, B, SARS coronavirus 2 was negative. CT pelvis with contrast  showed  Right perirectal region edema extending to the medial right gluteal subcutaneus soft tissues with several associated foci of gas. No definite organized fluid collection. Please note necrotizing fasciitis cannot be excluded as this is a clinical diagnosis. Patient was started on IV antibiotics (IV vancomycin, Zosyn and clindamycin), DKA protocol Endo tool was started, general surgery was consulted and will see patient in the morning per ED physician.  Assessment/Plan: DKA type II -Patient presented with serum glucose 476 and anion gap 16 with ketonuria -patient started on IV insulin with q 1 hour CBG check and q 4 hour BMPs -pt started on aggressive fluid resuscitation -Electrolytes were monitored and repleted -transitioned to [place] insulin once anion gap closed -diet was advanced once anion gap closed -11/04/20 HbA1C--12.8  Uncontrolled diabetes mellitus type 2 with hyperglycemia -Start Lantus 18 units -NovoLog sliding scale -A1c 12.8  Gluteal abscess/cellulitis -Appreciate surgery consult -No signs of necrotizing fasciitis -Continue vancomycin -Start ceftriaxone  Acute kidney injury -Baseline creatinine 1.0-1.1 -Presented with serum creatinine 1.66 -Continue IV fluids  Hyperlipidemia -Continue statin  Hypothyroidism -Continue Synthroid  Hyponatremia -Secondary to hyperglycemia and volume depletion -Continue IV fluids  Macrocytic anemia -Check B12--236--> start repletion as this is low normal range -Folic acid 54.0       Status is: Inpatient  Remains inpatient appropriate because:IV treatments appropriate due to intensity of illness or inability to take PO  Dispo: The patient is from: Home              Anticipated d/c is to: Home  Patient currently is not medically stable to d/c.   Difficult to place patient No        Family Communication:   spouse updated at bedside 6/21  Consultants:  none  Code Status:  FULL  DVT Prophylaxis: [place]  Lovenox   Procedures: As Listed in Progress Note Above  Antibiotics: Vanco 6/20>> Ceftriaxone 6/20>>    Total time spent 35 minutes.  Greater than 50% spent face to face counseling and coordinating care.   Subjective: Patient denies fevers, chills, headache, chest pain, dyspnea, nausea, vomiting, diarrhea, abdominal pain, dysuria, hematuria, hematochezia, and melena.   Objective: Vitals:   11/07/20 0700 11/07/20 0743 11/07/20 0800 11/07/20 0900  BP: (!) 163/86  (!) 159/86 (!) 150/77  Pulse: 77  82 79  Resp: 12  20 19   Temp:  97.6 F (36.4 C)    TempSrc:  Oral    SpO2: 97%  97% 96%  Weight:      Height:        Intake/Output Summary (Last 24 hours) at 11/07/2020 1054 Last data filed at 11/07/2020 0908 Gross per 24 hour  Intake 2863.4 ml  Output 850 ml  Net 2013.4 ml   Weight change:  Exam:  General:  Pt is alert, follows commands appropriately, not in acute distress HEENT: No icterus, No thrush, No neck mass, [place]/AT Cardiovascular: RRR, S1/S2, no rubs, no gallops Respiratory: CTA bilaterally, no wheezing, no crackles, no rhonchi Abdomen: Soft/+BS, non tender, non distended, no guarding Extremities: No edema, No lymphangitis, No petechiae, No rashes, no synovitis;  right gluteal cleft with erythema, minimal drainage.  No necrosis or crepitance   Data Reviewed: I have personally reviewed following labs and imaging studies Basic Metabolic Panel: Recent Labs  Lab 11/06/20 2033 11/06/20 2215 11/07/20 0329 11/07/20 0637 11/07/20 0702  NA 130* 128* 134* 135 136  K 4.5 4.0 3.6 3.5 3.5  CL 99 98 100 105 105  CO2 21* 19* 22 23 23   GLUCOSE 465* 476* 148* 156* 151*  BUN 25* 25* 21 20 20   CREATININE 1.59* 1.66* 1.38* 1.42* 1.44*  CALCIUM 9.1 9.2 9.0 8.8* 8.7*  MG  --   --   --  1.8  --   PHOS  --   --   --  2.4*  --    Liver Function Tests: Recent Labs  Lab 11/04/20 1223 11/07/20 0637  AST 22 434*  ALT 58* 281*  ALKPHOS 279* 596*  BILITOT 1.1 1.7*  PROT  7.6 6.1*  ALBUMIN 3.3* 2.8*   No results for input(s): LIPASE, AMYLASE in the last 168 hours. No results for input(s): AMMONIA in the last 168 hours. Coagulation Profile: Recent Labs  Lab 11/07/20 0637  INR 1.0   CBC: Recent Labs  Lab 11/04/20 1223 11/06/20 1343 11/06/20 2033 11/07/20 0637  WBC 8.4 6.8 5.4 5.6  NEUTROABS 6.6 4.9 3.8  --   HGB 12.6* 12.6* 12.2* 11.5*  HCT 37.6* 38.6* 36.2* 34.3*  MCV 98.9 101.0* 99.7 99.1  PLT 385 410* 378 396   Cardiac Enzymes: No results for input(s): CKTOTAL, CKMB, CKMBINDEX, TROPONINI in the last 168 hours. BNP: Invalid input(s): POCBNP CBG: Recent Labs  Lab 11/07/20 0441 11/07/20 0551 11/07/20 0749 11/07/20 0905 11/07/20 0956  GLUCAP 164* 171* 183* 175* 154*   HbA1C: Recent Labs    11/04/20 1223  HGBA1C 12.8*   Urine analysis:    Component Value Date/Time   COLORURINE YELLOW 11/06/2020 1902   APPEARANCEUR CLEAR 11/06/2020 1902  LABSPEC 1.027 11/06/2020 1902   PHURINE 5.0 11/06/2020 1902   GLUCOSEU >=500 (A) 11/06/2020 1902   HGBUR MODERATE (A) 11/06/2020 1902   BILIRUBINUR NEGATIVE 11/06/2020 1902   KETONESUR 20 (A) 11/06/2020 1902   PROTEINUR 30 (A) 11/06/2020 1902   NITRITE NEGATIVE 11/06/2020 1902   LEUKOCYTESUR NEGATIVE 11/06/2020 1902   Sepsis Labs: @LABRCNTIP (procalcitonin:4,lacticidven:4) ) Recent Results (from the past 240 hour(s))  Resp Panel by RT-PCR (Flu A&B, Covid) Nasopharyngeal Swab     Status: None   Collection Time: 11/06/20 10:55 PM   Specimen: Nasopharyngeal Swab; Nasopharyngeal(NP) swabs in vial transport medium  Result Value Ref Range Status   SARS Coronavirus 2 by RT PCR NEGATIVE NEGATIVE Final    Comment: (NOTE) SARS-CoV-2 target nucleic acids are NOT DETECTED.  The SARS-CoV-2 RNA is generally detectable in upper respiratory specimens during the acute phase of infection. The lowest concentration of SARS-CoV-2 viral copies this assay can detect is 138 copies/mL. A negative result  does not preclude SARS-Cov-2 infection and should not be used as the sole basis for treatment or other patient management decisions. A negative result may occur with  improper specimen collection/handling, submission of specimen other than nasopharyngeal swab, presence of viral mutation(s) within the areas targeted by this assay, and inadequate number of viral copies(<138 copies/mL). A negative result must be combined with clinical observations, patient history, and epidemiological information. The expected result is Negative.  Fact Sheet for Patients:  EntrepreneurPulse.com.au  Fact Sheet for Healthcare Providers:  IncredibleEmployment.be  This test is no t yet approved or cleared by the [place] FDA and  has been authorized for detection and/or diagnosis of SARS-CoV-2 by FDA under an Emergency Use Authorization (EUA). This EUA will remain  in effect (meaning this test can be used) for the duration of the COVID-19 declaration under Section 564(b)(1) of the Act, 21 U.S.C.section 360bbb-3(b)(1), unless the authorization is terminated  or revoked sooner.       Influenza A by PCR NEGATIVE NEGATIVE Final   Influenza B by PCR NEGATIVE NEGATIVE Final    Comment: (NOTE) The Xpert Xpress SARS-CoV-2/FLU/RSV plus assay is intended as an aid in the diagnosis of influenza from Nasopharyngeal swab specimens and should not be used as a sole basis for treatment. Nasal washings and aspirates are unacceptable for Xpert Xpress SARS-CoV-2/FLU/RSV testing.  Fact Sheet for Patients: EntrepreneurPulse.com.au  Fact Sheet for Healthcare Providers: IncredibleEmployment.be  This test is not yet approved or cleared by the [place] FDA and has been authorized for detection and/or diagnosis of SARS-CoV-2 by FDA under an Emergency Use Authorization (EUA). This EUA will remain in effect (meaning this test can be used) for  the duration of the COVID-19 declaration under Section 564(b)(1) of the Act, 21 U.S.C. section 360bbb-3(b)(1), unless the authorization is terminated or revoked.  Performed at [facility], [address] [place], [place] [address]   MRSA Next Gen by PCR, Nasal     Status: None   Collection Time: 11/07/20  1:41 AM   Specimen: Nasal Mucosa; Nasal Swab  Result Value Ref Range Status   MRSA by PCR Next Gen NOT DETECTED NOT DETECTED Final    Comment: (NOTE) The GeneXpert MRSA Assay (FDA approved for NASAL specimens only), is one component of a comprehensive MRSA colonization surveillance program. It is not intended to diagnose MRSA infection nor to guide or monitor treatment for MRSA infections. Test performance is not FDA approved in patients less than 23 years old. Performed at [facility], [phone]  [name] [place], [place] [address]      Scheduled Meds:  Chlorhexidine Gluconate Cloth  6 each Topical Daily   insulin glargine  18 Units Subcutaneous Daily   Continuous Infusions:  lactated ringers     vancomycin 1,000 mg (11/07/20 0954)    Procedures/Studies: CT PELVIS W CONTRAST  Result Date: 11/06/2020 CLINICAL DATA:  Anal or rectal abscess. Pt. Admitted for blood sugar problems. Pt. Has known rectal abscess. EXAM: CT PELVIS WITH CONTRAST TECHNIQUE: Multidetector CT imaging of the pelvis was performed using the standard protocol following the bolus administration of intravenous contrast. CONTRAST:  14mL OMNIPAQUE IOHEXOL 300 MG/ML  SOLN COMPARISON:  None. FINDINGS: Urinary Tract:  No abnormality visualized. Bowel: Unremarkable visualized pelvic bowel loops. Ureteral jet noted within urinary bladder on the right. Vascular/Lymphatic: Atherosclerotic plaque. No pathologically enlarged lymph nodes. No significant vascular abnormality seen. Reproductive: Prominent prostate. Otherwise no mass or other significant abnormality Other: No intraperitoneal free fluid. No intraperitoneal free gas.  No organized fluid collection. Musculoskeletal: Medial right gluteal subcutaneus soft tissue edema and several foci of gas with no definite organized fluid collection. Edema noted to extend from the right perirectal region. No CT findings of perineal or scrotal involvement. No suspicious bone lesions identified. IMPRESSION: 1. Right perirectal region edema extending to the medial right gluteal subcutaneus soft tissues with several associated foci of gas. No definite organized fluid collection. Please note necrotizing fasciitis cannot be excluded as this is a clinical diagnosis. 2. Prominent prostate. 3.  Aortic Atherosclerosis (ICD10-I70.0). Electronically Signed   By: Iven Finn M.D.   On: 11/06/2020 23:35    Orson Eva, DO  [facility]  If 7PM-7AM, please contact night-coverage [email or web address] Password [facility] 11/07/2020, 10:54 AM   LOS: 0 days

## 2020-11-07 NOTE — Progress Notes (Deleted)
Acute Office Visit  Subjective:    Patient ID: Alexander Banks, male    DOB: [birth year], 65 y.o.   MRN: [id number]  No chief complaint on file.   HPI Patient is in today for ***  Past Medical History:  Diagnosis Date   Anemia, iron deficiency    Diabetes mellitus without complication ([place])    on meds   Hearing loss    Bil hearing aids   Hyperlipidemia    Hypertension    Prediabetes    Sleep apnea     Past Surgical History:  Procedure Laterality Date   COLONOSCOPY     GIVENS CAPSULE STUDY     KNEE SURGERY     4 surgeries on left, 2 on right knee   SHOULDER SURGERY     right shoulder    Family History  Problem Relation Age of Onset   Heart attack Father    Heart attack Paternal Grandfather    Leukemia Sister    Heart attack Brother     Social History   Socioeconomic History   Marital status: Married    Spouse name: Not on file   Number of children: Not on file   Years of education: Not on file   Highest education level: Not on file  Occupational History   Not on file  Tobacco Use   Smoking status: Every Day    Pack years: 0.00    Types: Cigars   Smokeless tobacco: Never   Tobacco comments:    Smoke 3-4 cigars a day  Vaping Use   Vaping Use: Never used  Substance and Sexual Activity   Alcohol use: Yes    Comment: occasional   Drug use: Never   Sexual activity: Not on file  Other Topics Concern   Not on file  Social History Narrative   Not on file   Social Determinants of Health   Financial Resource Strain: Not on file  Food Insecurity: Not on file  Transportation Needs: Not on file  Physical Activity: Not on file  Stress: Not on file  Social Connections: Not on file  Intimate Partner Violence: Not on file    Facility-Administered Medications Prior to Visit  Medication Dose Route Frequency Provider Last Rate Last Admin   Chlorhexidine Gluconate Cloth 2 % PADS 6 each  6 each Topical Daily Adefeso, Oladapo, DO   6 each at 11/07/20 0955    dextrose 5 % in lactated ringers infusion   Intravenous Continuous Adefeso, Oladapo, DO 125 mL/hr at 11/07/20 0908 Infusion Verify at 11/07/20 0908   dextrose 50 % solution 0-50 mL  0-50 mL Intravenous PRN Adefeso, Oladapo, DO       insulin glargine (LANTUS) injection 18 Units  18 Units Subcutaneous Daily Tat, [name], MD       insulin regular, human (MYXREDLIN) 100 units/ 100 mL infusion   Intravenous Continuous Adefeso, Oladapo, DO 2.6 mL/hr at 11/07/20 0957 2.6 Units/hr at 11/07/20 0957   lactated ringers infusion   Intravenous Continuous Adefeso, Oladapo, DO   Stopped at 11/07/20 0240   vancomycin (VANCOCIN) IVPB 1000 mg/200 mL premix  1,000 mg Intravenous Q12H Erenest Blank, RPH 200 mL/hr at 11/07/20 0954 1,000 mg at 11/07/20 0177   Outpatient Medications Prior to Visit  Medication Sig Dispense Refill   amoxicillin-clavulanate (AUGMENTIN) 875-125 MG tablet Take 1 tablet by mouth every 12 (twelve) hours for 7 days. 14 tablet 0   Ferrous Fumarate (HEMOCYTE - 106 MG FE) 324 (106  Fe) MG TABS tablet Take 1 tablet by mouth daily.     glucose blood (ONETOUCH VERIO) test strip TEST ONE TIME DAILY 100 each 3   insulin detemir (LEVEMIR) 100 UNIT/ML FlexPen 8 units may taper up to 30 units upon direction of physician 15 mL 3   insulin lispro (HUMALOG KWIKPEN) 100 UNIT/ML KwikPen 5 units with lunch and dinner as directed by physician 15 mL 3   levothyroxine (SYNTHROID) 175 MCG tablet TAKE 1 qd (Patient taking differently: Take 175 mcg by mouth daily before breakfast.) 90 tablet 1   metroNIDAZOLE (FLAGYL) 500 MG tablet Take 1 tablet (500 mg total) by mouth 2 (two) times daily. 14 tablet 0   Nutritional Supplements (OSTEO ADVANCE PO) Take 1 tablet by mouth daily.     OneTouch Delica Lancets 71Q MISC TEST ONE TIME DAILY 100 each 11   rosuvastatin (CRESTOR) 40 MG tablet TAKE 1 TABLET DAILY (STOP ATORVASTATIN) (Patient taking differently: 40 mg daily.) 90 tablet 1    Allergies  Allergen Reactions    Lisinopril     Cough    Review of Systems     Objective:    Physical Exam  There were no vitals taken for this visit. Wt Readings from Last 3 Encounters:  11/07/20 204 lb 12.9 oz (92.9 kg)  11/06/20 202 lb 12.8 oz (92 kg)  11/04/20 199 lb (90.3 kg)    Health Maintenance Due  Topic Date Due   Pneumococcal Vaccine 33-20 Years old (1 - PCV) Never done   Zoster Vaccines- Shingrix (1 of 2) Never done   OPHTHALMOLOGY EXAM  08/14/2019   TETANUS/TDAP  11/17/2019    There are no preventive care reminders to display for this patient.   Lab Results  Component Value Date   TSH 0.529 11/03/2020   Lab Results  Component Value Date   WBC 5.6 11/07/2020   HGB 11.5 (L) 11/07/2020   HCT 34.3 (L) 11/07/2020   MCV 99.1 11/07/2020   PLT 396 11/07/2020   Lab Results  Component Value Date   NA 136 11/07/2020   K 3.5 11/07/2020   CO2 23 11/07/2020   GLUCOSE 151 (H) 11/07/2020   BUN 20 11/07/2020   CREATININE 1.44 (H) 11/07/2020   BILITOT 1.7 (H) 11/07/2020   ALKPHOS 596 (H) 11/07/2020   AST 434 (H) 11/07/2020   ALT 281 (H) 11/07/2020   PROT 6.1 (L) 11/07/2020   ALBUMIN 2.8 (L) 11/07/2020   CALCIUM 8.7 (L) 11/07/2020   ANIONGAP 8 11/07/2020   EGFR 35 (L) 11/03/2020   Lab Results  Component Value Date   CHOL 117 06/02/2020   Lab Results  Component Value Date   HDL 36 (L) 06/02/2020   Lab Results  Component Value Date   LDLCALC 51 06/02/2020   Lab Results  Component Value Date   TRIG 182 (H) 06/02/2020   Lab Results  Component Value Date   CHOLHDL 3.3 06/02/2020   Lab Results  Component Value Date   HGBA1C 12.8 (H) 11/04/2020       Assessment & Plan:   Problem List Items Addressed This Visit   None Visit Diagnoses     Diabetes mellitus without complication ([place])    -  Primary   Relevant Orders   Ambulatory referral to Endocrinology        No orders of the defined types were placed in this encounter.    Leda Min, LPN

## 2020-11-07 NOTE — Consult Note (Signed)
[facility] Consult  Reason for Consult: Perianal abscess  Referring Physician:  Dr. Sedonia Small / Dr. Carles Collet  Chief Complaint   Hyperglycemia     HPI: Alexander Banks is a 65 y.o. male with DM who reports noting swelling and pain in the rectal area about 2 weeks ago. He says that he was in [place] and had to fly home and was in pain. He then drove to [place]. He says that the area has opened up and been draining. He was being treated as an outpatient for a draining abscess but has DKA now and was sent to the ED yesterday. Dr. Sedonia Small called me last night after the CT to review, and the findings demonstrated inflammation and stranding without fluid and a small foci of gas. This was consistent with a perianal abscess. The patient's history nor imaging sounded like a necrotizing infection and he was admitted for DKA and IV antibiotics.   Continues to have drainage and tenderness. No prior abscess.   Past Medical History:  Diagnosis Date   Anemia, iron deficiency    Diabetes mellitus without complication ([place])    on meds   Hearing loss    Bil hearing aids   Hyperlipidemia    Hypertension    Prediabetes    Sleep apnea     Past Surgical History:  Procedure Laterality Date   COLONOSCOPY     GIVENS CAPSULE STUDY     KNEE SURGERY     4 surgeries on left, 2 on right knee   SHOULDER SURGERY     right shoulder    Family History  Problem Relation Age of Onset   Heart attack Father    Heart attack Paternal Grandfather    Leukemia Sister    Heart attack Brother     Social History   Tobacco Use   Smoking status: Every Day    Pack years: 0.00    Types: Cigars   Smokeless tobacco: Never   Tobacco comments:    Smoke 3-4 cigars a day  Vaping Use   Vaping Use: Never used  Substance Use Topics   Alcohol use: Yes    Comment: occasional   Drug use: Never    Medications: I have reviewed the patient's current medications. Prior to Admission:  Facility-Administered  Medications Prior to Admission  Medication Dose Route Frequency Provider Last Rate Last Admin   0.9 %  sodium chloride infusion  500 mL Intravenous Once Doran Stabler, MD       Medications Prior to Admission  Medication Sig Dispense Refill Last Dose   amoxicillin-clavulanate (AUGMENTIN) 875-125 MG tablet Take 1 tablet by mouth every 12 (twelve) hours for 7 days. 14 tablet 0 11/06/2020   Ferrous Fumarate (HEMOCYTE - 106 MG FE) 324 (106 Fe) MG TABS tablet Take 1 tablet by mouth daily.   11/06/2020   insulin detemir (LEVEMIR) 100 UNIT/ML FlexPen 8 units may taper up to 30 units upon direction of physician 15 mL 3 11/06/2020   insulin lispro (HUMALOG KWIKPEN) 100 UNIT/ML KwikPen 5 units with lunch and dinner as directed by physician 15 mL 3 11/06/2020   levothyroxine (SYNTHROID) 175 MCG tablet TAKE 1 qd (Patient taking differently: Take 175 mcg by mouth daily before breakfast.) 90 tablet 1 11/06/2020   metroNIDAZOLE (FLAGYL) 500 MG tablet Take 1 tablet (500 mg total) by mouth 2 (two) times daily. 14 tablet 0 11/06/2020   Nutritional Supplements (OSTEO ADVANCE PO) Take 1 tablet by mouth daily.  11/06/2020   rosuvastatin (CRESTOR) 40 MG tablet TAKE 1 TABLET DAILY (STOP ATORVASTATIN) (Patient taking differently: 40 mg daily.) 90 tablet 1 11/06/2020   glucose blood (ONETOUCH VERIO) test strip TEST ONE TIME DAILY 100 each 3    OneTouch Delica Lancets 65L MISC TEST ONE TIME DAILY 100 each 11    Scheduled:  Chlorhexidine Gluconate Cloth  6 each Topical Daily   insulin glargine  18 Units Subcutaneous Daily   Continuous:  cefTRIAXone (ROCEPHIN)  IV     lactated ringers 125 mL/hr at 11/07/20 1102   vancomycin 1,000 mg (11/07/20 0954)   [id number]  Allergies  Allergen Reactions   Lisinopril     Cough     ROS:  A comprehensive review of systems was negative except for: Gastrointestinal: positive for perianal pain and tenderness, drainage, swelling  Blood pressure 131/67, pulse 76, temperature  97.6 F (36.4 C), temperature source Oral, resp. rate 18, height 6\' 3"  (1.905 m), weight 92.9 kg, SpO2 96 %. Physical Exam Vitals reviewed.  Constitutional:      Appearance: Normal appearance.  HENT:     Head: Normocephalic.     Nose: Nose normal.  Eyes:     Extraocular Movements: Extraocular movements intact.  Cardiovascular:     Rate and Rhythm: Normal rate.  Pulmonary:     Effort: Pulmonary effort is normal.  Abdominal:     General: There is no distension.     Tenderness: There is no abdominal tenderness.  Genitourinary:    Comments: Right perianal region with swelling and tenderness, induration, and purulence expressed from draining opening, minor redness, no bullae or skin changes, expected tenderness  Musculoskeletal:        General: No swelling.     Cervical back: Normal range of motion.  Skin:    General: Skin is warm.     Findings: No lesion.  Neurological:     General: No focal deficit present.     Mental Status: He is alert and oriented to person, place, and time.  Psychiatric:        Mood and Affect: Mood normal.        Behavior: Behavior normal.    Results: Results for orders placed or performed during the hospital encounter of 11/06/20 (from the past 48 hour(s))  Urinalysis, Routine w reflex microscopic     Status: Abnormal   Collection Time: 11/06/20  7:02 PM  Result Value Ref Range   Color, Urine YELLOW YELLOW   APPearance CLEAR CLEAR   Specific Gravity, Urine 1.027 1.005 - 1.030   pH 5.0 5.0 - 8.0   Glucose, UA >=500 (A) NEGATIVE mg/dL   Hgb urine dipstick MODERATE (A) NEGATIVE   Bilirubin Urine NEGATIVE NEGATIVE   Ketones, ur 20 (A) NEGATIVE mg/dL   Protein, ur 30 (A) NEGATIVE mg/dL   Nitrite NEGATIVE NEGATIVE   Leukocytes,Ua NEGATIVE NEGATIVE   RBC / HPF 0-5 0 - 5 RBC/hpf   WBC, UA 0-5 0 - 5 WBC/hpf   Bacteria, UA NONE SEEN NONE SEEN   Squamous Epithelial / LPF 0-5 0 - 5   Mucus PRESENT     Comment: Performed at [facility], [address], [place], [place] [address]  Blood gas, venous     Status: Abnormal   Collection Time: 11/06/20  7:53 PM  Result Value Ref Range   FIO2 21.00    pH, Ven 7.310 7.250 - 7.430   pCO2, Ven 37.4 (L) 44.0 - 60.0 mmHg   pO2,  Ven 34.5 32.0 - 45.0 mmHg   Bicarbonate 18.2 (L) 20.0 - 28.0 mmol/L   Acid-base deficit 6.8 (H) 0.0 - 2.0 mmol/L   O2 Saturation 61.3 %   Patient temperature 37.3     Comment: Performed at [facility], [address], [place], [place] [address]  Basic metabolic panel     Status: Abnormal   Collection Time: 11/06/20  8:33 PM  Result Value Ref Range   Sodium 130 (L) 135 - 145 mmol/L   Potassium 4.5 3.5 - 5.1 mmol/L   Chloride 99 98 - 111 mmol/L   CO2 21 (L) 22 - 32 mmol/L   Glucose, Bld 465 (H) 70 - 99 mg/dL    Comment: Glucose reference range applies only to samples taken after fasting for at least 8 hours.   BUN 25 (H) 8 - 23 mg/dL   Creatinine, Ser 1.59 (H) 0.61 - 1.24 mg/dL   Calcium 9.1 8.9 - 10.3 mg/dL   GFR, Estimated 48 (L) >60 mL/min    Comment: (NOTE) Calculated using the CKD-EPI Creatinine Equation (2021)    Anion gap 10 5 - 15    Comment: Performed at [facility], [address] [place], [place] [address]  Beta-hydroxybutyric acid     Status: Abnormal   Collection Time: 11/06/20  8:33 PM  Result Value Ref Range   Beta-Hydroxybutyric Acid 1.89 (H) 0.05 - 0.27 mmol/L    Comment: Performed at [facility], [address] [place], [place] [address]  CBC with Differential (PNL)     Status: Abnormal   Collection Time: 11/06/20  8:33 PM  Result Value Ref Range   WBC 5.4 4.0 - 10.5 K/uL   RBC 3.63 (L) 4.22 - 5.81 MIL/uL   Hemoglobin 12.2 (L) 13.0 - 17.0 g/dL   HCT 36.2 (L) 39.0 - 52.0 %   MCV 99.7 80.0 - 100.0 fL   MCH 33.6 26.0 - 34.0 pg   MCHC 33.7 30.0 - 36.0 g/dL   RDW 13.5 11.5 - 15.5 %   Platelets 378 150 - 400 K/uL   nRBC 0.0 0.0 - 0.2 %   Neutrophils Relative % 69 %   Neutro Abs 3.8 1.7 - 7.7 K/uL   Lymphocytes Relative 16 %   Lymphs Abs 0.9  0.7 - 4.0 K/uL   Monocytes Relative 12 %   Monocytes Absolute 0.6 0.1 - 1.0 K/uL   Eosinophils Relative 1 %   Eosinophils Absolute 0.0 0.0 - 0.5 K/uL   Basophils Relative 1 %   Basophils Absolute 0.0 0.0 - 0.1 K/uL   Immature Granulocytes 1 %   Abs Immature Granulocytes 0.07 0.00 - 0.07 K/uL    Comment: Performed at [facility], [address] [place], [place] [address]  CBG monitoring, ED     Status: Abnormal   Collection Time: 11/06/20  9:35 PM  Result Value Ref Range   Glucose-Capillary 446 (H) 70 - 99 mg/dL    Comment: Glucose reference range applies only to samples taken after fasting for at least 8 hours.  Basic metabolic panel     Status: Abnormal   Collection Time: 11/06/20 10:15 PM  Result Value Ref Range   Sodium 128 (L) 135 - 145 mmol/L   Potassium 4.0 3.5 - 5.1 mmol/L   Chloride 98 98 - 111 mmol/L   CO2 19 (L) 22 - 32 mmol/L   Glucose, Bld 476 (H) 70 - 99 mg/dL    Comment: Glucose reference range applies only to samples taken  after fasting for at least 8 hours.   BUN 25 (H) 8 - 23 mg/dL   Creatinine, Ser 1.66 (H) 0.61 - 1.24 mg/dL   Calcium 9.2 8.9 - 10.3 mg/dL   GFR, Estimated 46 (L) >60 mL/min    Comment: (NOTE) Calculated using the CKD-EPI Creatinine Equation (2021)    Anion gap 11 5 - 15    Comment: Performed at [facility], [address] [place], [place] [address]  CBG monitoring, ED     Status: Abnormal   Collection Time: 11/06/20 10:44 PM  Result Value Ref Range   Glucose-Capillary 391 (H) 70 - 99 mg/dL    Comment: Glucose reference range applies only to samples taken after fasting for at least 8 hours.  Resp Panel by RT-PCR (Flu A&B, Covid) Nasopharyngeal Swab     Status: None   Collection Time: 11/06/20 10:55 PM   Specimen: Nasopharyngeal Swab; Nasopharyngeal(NP) swabs in vial transport medium  Result Value Ref Range   SARS Coronavirus 2 by RT PCR NEGATIVE NEGATIVE    Comment: (NOTE) SARS-CoV-2 target nucleic acids are NOT DETECTED.  The  SARS-CoV-2 RNA is generally detectable in upper respiratory specimens during the acute phase of infection. The lowest concentration of SARS-CoV-2 viral copies this assay can detect is 138 copies/mL. A negative result does not preclude SARS-Cov-2 infection and should not be used as the sole basis for treatment or other patient management decisions. A negative result may occur with  improper specimen collection/handling, submission of specimen other than nasopharyngeal swab, presence of viral mutation(s) within the areas targeted by this assay, and inadequate number of viral copies(<138 copies/mL). A negative result must be combined with clinical observations, patient history, and epidemiological information. The expected result is Negative.  Fact Sheet for Patients:  EntrepreneurPulse.com.au  Fact Sheet for Healthcare Providers:  IncredibleEmployment.be  This test is no t yet approved or cleared by the [place] FDA and  has been authorized for detection and/or diagnosis of SARS-CoV-2 by FDA under an Emergency Use Authorization (EUA). This EUA will remain  in effect (meaning this test can be used) for the duration of the COVID-19 declaration under Section 564(b)(1) of the Act, 21 U.S.C.section 360bbb-3(b)(1), unless the authorization is terminated  or revoked sooner.       Influenza A by PCR NEGATIVE NEGATIVE   Influenza B by PCR NEGATIVE NEGATIVE    Comment: (NOTE) The Xpert Xpress SARS-CoV-2/FLU/RSV plus assay is intended as an aid in the diagnosis of influenza from Nasopharyngeal swab specimens and should not be used as a sole basis for treatment. Nasal washings and aspirates are unacceptable for Xpert Xpress SARS-CoV-2/FLU/RSV testing.  Fact Sheet for Patients: EntrepreneurPulse.com.au  Fact Sheet for Healthcare Providers: IncredibleEmployment.be  This test is not yet approved or cleared by the  [place] FDA and has been authorized for detection and/or diagnosis of SARS-CoV-2 by FDA under an Emergency Use Authorization (EUA). This EUA will remain in effect (meaning this test can be used) for the duration of the COVID-19 declaration under Section 564(b)(1) of the Act, 21 U.S.C. section 360bbb-3(b)(1), unless the authorization is terminated or revoked.  Performed at [facility], [address] [place], [place] [address]   CBG monitoring, ED     Status: Abnormal   Collection Time: 11/06/20 11:53 PM  Result Value Ref Range   Glucose-Capillary 407 (H) 70 - 99 mg/dL    Comment: Glucose reference range applies only to samples taken after fasting for at least 8 hours.  CBG  monitoring, ED     Status: Abnormal   Collection Time: 11/07/20 12:31 AM  Result Value Ref Range   Glucose-Capillary 310 (H) 70 - 99 mg/dL    Comment: Glucose reference range applies only to samples taken after fasting for at least 8 hours.  MRSA Next Gen by PCR, Nasal     Status: None   Collection Time: 11/07/20  1:41 AM   Specimen: Nasal Mucosa; Nasal Swab  Result Value Ref Range   MRSA by PCR Next Gen NOT DETECTED NOT DETECTED    Comment: (NOTE) The GeneXpert MRSA Assay (FDA approved for NASAL specimens only), is one component of a comprehensive MRSA colonization surveillance program. It is not intended to diagnose MRSA infection nor to guide or monitor treatment for MRSA infections. Test performance is not FDA approved in patients less than 17 years old. Performed at [facility], [address], [place], [place] [address]   Glucose, capillary     Status: Abnormal   Collection Time: 11/07/20  1:50 AM  Result Value Ref Range   Glucose-Capillary 234 (H) 70 - 99 mg/dL    Comment: Glucose reference range applies only to samples taken after fasting for at least 8 hours.   Comment 1 Notify RN   Glucose, capillary     Status: Abnormal   Collection Time: 11/07/20  2:38 AM  Result Value Ref Range    Glucose-Capillary 183 (H) 70 - 99 mg/dL    Comment: Glucose reference range applies only to samples taken after fasting for at least 8 hours.  Basic metabolic panel     Status: Abnormal   Collection Time: 11/07/20  3:29 AM  Result Value Ref Range   Sodium 134 (L) 135 - 145 mmol/L   Potassium 3.6 3.5 - 5.1 mmol/L   Chloride 100 98 - 111 mmol/L   CO2 22 22 - 32 mmol/L   Glucose, Bld 148 (H) 70 - 99 mg/dL    Comment: Glucose reference range applies only to samples taken after fasting for at least 8 hours.   BUN 21 8 - 23 mg/dL   Creatinine, Ser 1.38 (H) 0.61 - 1.24 mg/dL   Calcium 9.0 8.9 - 10.3 mg/dL   GFR, Estimated 57 (L) >60 mL/min    Comment: (NOTE) Calculated using the CKD-EPI Creatinine Equation (2021)    Anion gap 12 5 - 15    Comment: Performed at [facility], [address], [place], [place] [address]  Beta-hydroxybutyric acid     Status: None   Collection Time: 11/07/20  3:29 AM  Result Value Ref Range   Beta-Hydroxybutyric Acid 0.27 0.05 - 0.27 mmol/L    Comment: Performed at [facility], [address] [place], [place] [address]  HIV Antibody (routine testing w rflx)     Status: None   Collection Time: 11/07/20  3:30 AM  Result Value Ref Range   HIV Screen 4th Generation wRfx Non Reactive Non Reactive    Comment: Performed at [facility], [place] [address] [place], [place] [address]  Glucose, capillary     Status: Abnormal   Collection Time: 11/07/20  3:39 AM  Result Value Ref Range   Glucose-Capillary 177 (H) 70 - 99 mg/dL    Comment: Glucose reference range applies only to samples taken after fasting for at least 8 hours.  Glucose, capillary     Status: Abnormal   Collection Time: 11/07/20  4:41 AM  Result Value Ref Range   Glucose-Capillary 164 (H) 70 -  99 mg/dL    Comment: Glucose reference range applies only to samples taken after fasting for at least 8 hours.  Glucose, capillary     Status: Abnormal   Collection Time: 11/07/20  5:51 AM  Result Value Ref  Range   Glucose-Capillary 171 (H) 70 - 99 mg/dL    Comment: Glucose reference range applies only to samples taken after fasting for at least 8 hours.   Comment 1 Notify RN   Comprehensive metabolic panel     Status: Abnormal   Collection Time: 11/07/20  6:37 AM  Result Value Ref Range   Sodium 135 135 - 145 mmol/L   Potassium 3.5 3.5 - 5.1 mmol/L   Chloride 105 98 - 111 mmol/L   CO2 23 22 - 32 mmol/L   Glucose, Bld 156 (H) 70 - 99 mg/dL    Comment: Glucose reference range applies only to samples taken after fasting for at least 8 hours.   BUN 20 8 - 23 mg/dL   Creatinine, Ser 1.42 (H) 0.61 - 1.24 mg/dL   Calcium 8.8 (L) 8.9 - 10.3 mg/dL   Total Protein 6.1 (L) 6.5 - 8.1 g/dL   Albumin 2.8 (L) 3.5 - 5.0 g/dL   AST 434 (H) 15 - 41 U/L   ALT 281 (H) 0 - 44 U/L   Alkaline Phosphatase 596 (H) 38 - 126 U/L   Total Bilirubin 1.7 (H) 0.3 - 1.2 mg/dL   GFR, Estimated 56 (L) >60 mL/min    Comment: (NOTE) Calculated using the CKD-EPI Creatinine Equation (2021)    Anion gap 7 5 - 15    Comment: Performed at [facility], [address], [place], [place] [address]  CBC     Status: Abnormal   Collection Time: 11/07/20  6:37 AM  Result Value Ref Range   WBC 5.6 4.0 - 10.5 K/uL   RBC 3.46 (L) 4.22 - 5.81 MIL/uL   Hemoglobin 11.5 (L) 13.0 - 17.0 g/dL   HCT 34.3 (L) 39.0 - 52.0 %   MCV 99.1 80.0 - 100.0 fL   MCH 33.2 26.0 - 34.0 pg   MCHC 33.5 30.0 - 36.0 g/dL   RDW 13.6 11.5 - 15.5 %   Platelets 396 150 - 400 K/uL   nRBC 0.0 0.0 - 0.2 %    Comment: Performed at [facility], [address], [place], [place] [address]  Protime-INR     Status: None   Collection Time: 11/07/20  6:37 AM  Result Value Ref Range   Prothrombin Time 13.1 11.4 - 15.2 seconds   INR 1.0 0.8 - 1.2    Comment: (NOTE) INR goal varies based on device and disease states. Performed at [facility], [address] [place], [place] [address]   APTT     Status: None   Collection Time: 11/07/20  6:37 AM  Result Value  Ref Range   aPTT 27 24 - 36 seconds    Comment: Performed at [facility], [address] [place], [place] [address]  Magnesium     Status: None   Collection Time: 11/07/20  6:37 AM  Result Value Ref Range   Magnesium 1.8 1.7 - 2.4 mg/dL    Comment: Performed at [facility], [address] [place], [place] [address]  Phosphorus     Status: Abnormal   Collection Time: 11/07/20  6:37 AM  Result Value Ref Range   Phosphorus 2.4 (L) 2.5 - 4.6 mg/dL    Comment: Performed at [facility], [address], [place], [place] [address]  Vitamin B12     Status: None   Collection Time: 11/07/20  6:38 AM  Result Value Ref Range   Vitamin B-12 236 180 - 914 pg/mL    Comment: (NOTE) This assay is not validated for testing neonatal or myeloproliferative syndrome specimens for Vitamin B12 levels. Performed at [facility] [address] [place], [place] [address]   Folate     Status: None   Collection Time: 11/07/20  6:38 AM  Result Value Ref Range   Folate 11.2 >5.9 ng/mL    Comment: Performed at [facility], [address] [place], [place] [address]  Basic metabolic panel     Status: Abnormal   Collection Time: 11/07/20  7:02 AM  Result Value Ref Range   Sodium 136 135 - 145 mmol/L   Potassium 3.5 3.5 - 5.1 mmol/L   Chloride 105 98 - 111 mmol/L   CO2 23 22 - 32 mmol/L   Glucose, Bld 151 (H) 70 - 99 mg/dL    Comment: Glucose reference range applies only to samples taken after fasting for at least 8 hours.   BUN 20 8 - 23 mg/dL   Creatinine, Ser 1.44 (H) 0.61 - 1.24 mg/dL   Calcium 8.7 (L) 8.9 - 10.3 mg/dL   GFR, Estimated 55 (L) >60 mL/min    Comment: (NOTE) Calculated using the CKD-EPI Creatinine Equation (2021)    Anion gap 8 5 - 15    Comment: Performed at [facility] [address], [place], [place] [address]  Glucose, capillary     Status: Abnormal   Collection Time: 11/07/20  7:49 AM  Result Value Ref Range   Glucose-Capillary 183 (H) 70 - 99 mg/dL    Comment: Glucose  reference range applies only to samples taken after fasting for at least 8 hours.  Glucose, capillary     Status: Abnormal   Collection Time: 11/07/20  9:05 AM  Result Value Ref Range   Glucose-Capillary 175 (H) 70 - 99 mg/dL    Comment: Glucose reference range applies only to samples taken after fasting for at least 8 hours.  Glucose, capillary     Status: Abnormal   Collection Time: 11/07/20  9:56 AM  Result Value Ref Range   Glucose-Capillary 154 (H) 70 - 99 mg/dL    Comment: Glucose reference range applies only to samples taken after fasting for at least 8 hours.  Basic metabolic panel     Status: Abnormal   Collection Time: 11/07/20 10:27 AM  Result Value Ref Range   Sodium 135 135 - 145 mmol/L   Potassium 3.6 3.5 - 5.1 mmol/L   Chloride 105 98 - 111 mmol/L   CO2 24 22 - 32 mmol/L   Glucose, Bld 171 (H) 70 - 99 mg/dL    Comment: Glucose reference range applies only to samples taken after fasting for at least 8 hours.   BUN 18 8 - 23 mg/dL   Creatinine, Ser 1.45 (H) 0.61 - 1.24 mg/dL   Calcium 8.6 (L) 8.9 - 10.3 mg/dL   GFR, Estimated 54 (L) >60 mL/min    Comment: (NOTE) Calculated using the CKD-EPI Creatinine Equation (2021)    Anion gap 6 5 - 15    Comment: Performed at [facility], [address], [place], [place] [address]  Beta-hydroxybutyric acid     Status: None   Collection Time: 11/07/20 10:27 AM  Result Value Ref Range   Beta-Hydroxybutyric Acid 0.17 0.05 -  0.27 mmol/L    Comment: Performed at [facility], [address] [place], [place] [address]  Glucose, capillary     Status: Abnormal   Collection Time: 11/07/20 11:03 AM  Result Value Ref Range   Glucose-Capillary 184 (H) 70 - 99 mg/dL    Comment: Glucose reference range applies only to samples taken after fasting for at least 8 hours.   Personally reviewed- draining abscess, with small foci of air from opening and draining CT PELVIS W CONTRAST  Result Date: 11/06/2020 CLINICAL DATA:  Anal or rectal  abscess. Pt. Admitted for blood sugar problems. Pt. Has known rectal abscess. EXAM: CT PELVIS WITH CONTRAST TECHNIQUE: Multidetector CT imaging of the pelvis was performed using the standard protocol following the bolus administration of intravenous contrast. CONTRAST:  31mL OMNIPAQUE IOHEXOL 300 MG/ML  SOLN COMPARISON:  None. FINDINGS: Urinary Tract:  No abnormality visualized. Bowel: Unremarkable visualized pelvic bowel loops. Ureteral jet noted within urinary bladder on the right. Vascular/Lymphatic: Atherosclerotic plaque. No pathologically enlarged lymph nodes. No significant vascular abnormality seen. Reproductive: Prominent prostate. Otherwise no mass or other significant abnormality Other: No intraperitoneal free fluid. No intraperitoneal free gas. No organized fluid collection. Musculoskeletal: Medial right gluteal subcutaneus soft tissue edema and several foci of gas with no definite organized fluid collection. Edema noted to extend from the right perirectal region. No CT findings of perineal or scrotal involvement. No suspicious bone lesions identified. IMPRESSION: 1. Right perirectal region edema extending to the medial right gluteal subcutaneus soft tissues with several associated foci of gas. No definite organized fluid collection. Please note necrotizing fasciitis cannot be excluded as this is a clinical diagnosis. 2. Prominent prostate. 3.  Aortic Atherosclerosis (ICD10-I70.0). Electronically Signed   By: Iven Finn M.D.   On: 11/06/2020 23:35     Assessment & Plan:  AVRAJ LINDROTH is a 65 y.o. male with draining perianal abscess in setting of DKA. No signs of necrotizing infection. No additional drainage needed right now.  -Diet ok -PRN for pain -Sitz baths if available -Continue antibiotics -Will monitor   All questions were answered to the satisfaction of the patient. Updated Dr. Carles Collet.     Alexander Banks 11/07/2020, 12:05 PM

## 2020-11-07 NOTE — Progress Notes (Signed)
Pt arrived to room 335 in stable condition. Alert and oriented to room, call light within reach. Bed in lowest position. Will cont to monitor.

## 2020-11-07 NOTE — Progress Notes (Signed)
Pharmacy Antibiotic Note  Alexander Banks is a 65 y.o. male admitted on 11/06/2020 with  DKA .  Pharmacy has been consulted for Vancomycin dosing for cellulitis/buttock abscess. WBC WNL. Noted renal dysfunction .  Plan: Vancomycin 1750 mg IV x 1 already given in the ED, now give 1000 mg IV q12h >>Estimated AUC: 508 Trend WBC, temp, renal function  F/U infectious work-up Drug levels as indicated   Height: 6\' 3"  (190.5 cm) Weight: 92.9 kg (204 lb 12.9 oz) IBW/kg (Calculated) : 84.5  Temp (24hrs), Avg:98.3 F (36.8 C), Min:97.7 F (36.5 C), Max:99.1 F (37.3 C)  Recent Labs  Lab 11/04/20 1223 11/04/20 1503 11/06/20 1343 11/06/20 2033 11/06/20 2215 11/07/20 0329  WBC 8.4  --  6.8 5.4  --   --   CREATININE 1.88* 1.69* 1.75* 1.59* 1.66* 1.38*    Estimated Creatinine Clearance: 65.5 mL/min (A) (by C-G formula based on SCr of 1.38 mg/dL (H)).    Allergies  Allergen Reactions   Lisinopril     Cough    Narda Bonds, PharmD, BCPS Clinical Pharmacist Phone: [phone]

## 2020-11-07 NOTE — Progress Notes (Addendum)
Inpatient Diabetes Program Recommendations  AACE/ADA: New Consensus Statement on Inpatient Glycemic Control  Target Ranges:  Prepandial:   less than 140 mg/dL      Peak postprandial:   less than 180 mg/dL (1-2 hours)      Critically ill patients:  140 - 180 mg/dL   Results for THURMON, MIZELL (MRN [id number]) as of 11/07/2020 12:26  Ref. Range 11/07/2020 07:49 11/07/2020 09:05 11/07/2020 09:56 11/07/2020 11:03 11/07/2020 12:09  Glucose-Capillary Latest Ref Range: 70 - 99 mg/dL 183 (H) 175 (H) 154 (H) 184 (H) 157 (H)    Review of Glycemic Control  Diabetes history: DM2 Outpatient Diabetes medications: Levemir 10 units daily, Humalog 5 units TID with meals (insulin just started 11/04/20) Current orders for Inpatient glycemic control: IV insulin, Lantus 18 units daily  Inpatient Diabetes Program Recommendations:    Insulin: Noted patient transitioning from IV to SQ insulin and was given Lantus 18 units at 11:20 am today. Once IV insulin is stopped, please consider ordering CBGs AC&HS, Novolog 0-9 units AC&HS, and Novolog 4 units TID with meals for meal coverage if patient eats at least 50% of meals.  NOTE: Diabetes coordinator working remotely. Called patient over his cell phone. Spoke with patient about diabetes and home regimen for diabetes control. Patient reports being followed by PCP for diabetes management and was recently started on Levemir and Humalog insulin on 11/04/20).  Patient states that he was staying in close contact with PCP and texting him about glucose control and PCP was making insulin adjustments based on glucose readings. Patient states he was taking Levemir 10 units daily and Humalog 5 units TID with meals prior to admission. Patient states he is using insulin pens. Patient reports that his glucose has ranged from 300-500 mg/dl over the past 2 days prior to admission. Patient denies any recent medication changes or steroid use. Anticipate perianal abscess contributing to noted  hyperglycemia.  Discussed A1C results (12.8% on 11/04/20) and explained that current A1C indicates an average glucose of 321 mg/dl over the past 2-3 months. Discussed glucose and A1C goals. Discussed importance of checking CBGs and maintaining good CBG control to prevent long-term and short-term complications. Patient reports that he has a appointment with Endocrinology on Monday June 27. Discussed DKA treatment with IV insulin and transitioning to SQ insulin. Explained that glucose trends will be followed and insulin adjusted by attending provider accordingly.  Patient states he has plenty of Levemir and Humalog at home (got 5 insulin pens of both when filled on 11/04/20) and he states he has no other needs related to DM at this time. Encouraged patient to continue to stay in close contact with PCP regarding glucose control and encouraged patient to keep appointment with Endocrinology on Monday.  Patient verbalized understanding of information discussed and reports no further questions at this time related to diabetes.  Thanks, Barnie Alderman, RN, MSN, CDE Diabetes Coordinator Inpatient Diabetes Program [phone] (Team Pager)

## 2020-11-07 NOTE — H&P (Signed)
History and Physical  Alexander Banks LKG:[id number] DOB: Jul 17, 1955 DOA: 11/06/2020  Referring physician: Maudie Flakes, MD PCP: [name], MD  Patient coming from: Home  Chief Complaint:  Increased thirst and wound drainage from buttocks  HPI: Alexander Banks is a 65 y.o. male with medical history significant for T2DM, hypertension, hyperlipidemia, hypothyroidism who presents to the emergency department for evaluation of elevated blood glucose level.  Patient complained of 3-week onset of increased thirst and urination and weight loss.  He also complained of 1 week onset of possible abscess in his buttock which was popped by wife at home with relief, he complained of subjective fever and purulent drainage for about 4 to 5 days and was seen at an urgent care on 11/03/2020 where patient was prescribed with Augmentin and Flagyl and advised on conservative treatment.  He was seen in the ED on 6/18 where patient was suspected to have early onset of DKA, acute kidney injury and abscess of buttock.  IV hydration was provided, insulin was given and patient was advised to follow-up with his PCP.  He followed with his PCP yesterday (6/20), lab work done showed elevated beta hydroxybutyrate level as well as increased anion gap with creatinine being above baseline level, his PCP consulted with an endocrinologist who recommends that patient should go to an ED since patient was still in DKA.  He denies nausea, vomiting, chest pain, shortness of breath  ED Course:  In the emergency department, he was hemodynamically stable.  Work-up in the ED showed macrocytic anemia, hyponatremia, BUN to creatinine 25/1.66 (baseline creatinine at 1.1-1.2), beta hydroxybutyric acid 3.86> 1.89, CBG 476.  Influenza A, B, SARS coronavirus 2 was negative. CT pelvis with contrast showed  Right perirectal region edema extending to the medial right gluteal subcutaneus soft tissues with several associated foci of gas. No definite  organized fluid collection. Please note necrotizing fasciitis cannot be excluded as this is a clinical diagnosis. Patient was started on IV antibiotics (IV vancomycin, Zosyn and clindamycin), DKA protocol Endo tool was started, general surgery was consulted and will see patient in the morning per ED physician.  Review of Systems: Constitutional: Negative for chills and fever.  HENT: Negative for ear pain and sore throat.   Eyes: Negative for pain and visual disturbance.  Respiratory: Negative for cough, chest tightness and shortness of breath.   Cardiovascular: Negative for chest pain and palpitations.  Gastrointestinal: Negative for abdominal pain and vomiting.  Endocrine: Positive for increased thirst and urination. Genitourinary: Negative for decreased urine volume, dysuria, enuresis Musculoskeletal: Positive for abscess in rectal area.  Negative for arthralgias and back pain.  Skin: Positive for color change around the rectum. Allergic/Immunologic: Negative for immunocompromised state.  Neurological: Negative for tremors, syncope, speech difficulty, weakness, light-headedness and headaches.  Hematological: Does not bruise/bleed easily.  All other systems reviewed and are negative    Past Medical History:  Diagnosis Date   Anemia, iron deficiency    Diabetes mellitus without complication ([place])    on meds   Hearing loss    Bil hearing aids   Hyperlipidemia    Hypertension    Prediabetes    Sleep apnea    Past Surgical History:  Procedure Laterality Date   COLONOSCOPY     GIVENS CAPSULE STUDY     KNEE SURGERY     4 surgeries on left, 2 on right knee   SHOULDER SURGERY     right shoulder    Social History:  reports that he has been smoking cigars. He has never used smokeless tobacco. He reports current alcohol use. He reports that he does not use drugs.   Allergies  Allergen Reactions   Lisinopril     Cough    Family History  Problem Relation Age of Onset   Heart  attack Father    Heart attack Paternal Grandfather    Leukemia Sister    Heart attack Brother      Prior to Admission medications   Medication Sig Start Date End Date Taking? Authorizing Provider  amoxicillin-clavulanate (AUGMENTIN) 875-125 MG tablet Take 1 tablet by mouth every 12 (twelve) hours for 7 days. 11/03/20 11/10/20  Wurst, [place], PA-C  glucose blood (ONETOUCH VERIO) test strip TEST ONE TIME DAILY 11/30/19   [name], MD  insulin detemir (LEVEMIR) 100 UNIT/ML FlexPen 8 units may taper up to 30 units upon direction of physician 11/04/20   [name], MD  insulin lispro (HUMALOG KWIKPEN) 100 UNIT/ML KwikPen 5 units with lunch and dinner as directed by physician 11/04/20   [name], MD  levothyroxine (SYNTHROID) 175 MCG tablet TAKE 1 qd 07/25/20   [name], MD  metroNIDAZOLE (FLAGYL) 500 MG tablet Take 1 tablet (500 mg total) by mouth 2 (two) times daily. 11/03/20   Wurst, [place], PA-C  OneTouch Delica Lancets 67M MISC TEST ONE TIME DAILY 11/30/19   [name], MD  rosuvastatin (CRESTOR) 40 MG tablet TAKE 1 TABLET DAILY (STOP ATORVASTATIN) 07/25/20   [name], MD    Physical Exam: BP 140/75   Pulse 82   Temp 97.7 F (36.5 C) (Oral)   Resp 18   Ht 6\' 3"  (1.905 m)   Wt 92.9 kg   SpO2 98%   BMI 25.60 kg/m   General: 65 y.o. year-old male well developed well nourished in no acute distress.  Alert and oriented x3. HEENT: Dry mucous membrane.  NCAT, EOMI Neck: Supple, trachea medial Cardiovascular: Regular rate and rhythm with no rubs or gallops.  No thyromegaly or JVD noted.  No lower extremity edema. 2/4 pulses in all 4 extremities. Respiratory: Clear to auscultation with no wheezes or rales. Good inspiratory effort. Abdomen: Soft, nontender nondistended with normal bowel sounds x4 quadrants. Muskuloskeletal: Right perirectal abscess with serosanguineous pus drainage and with surrounding erythema.  No cyanosis, clubbing or edema noted  bilaterally Neuro: CN II-XII intact, strength 5/5 x 4, sensation, reflexes intact Skin: No ulcerative lesions noted or rashes Psychiatry: Mood is appropriate for condition and setting          Labs on Admission:  Basic Metabolic Panel: Recent Labs  Lab 11/04/20 1223 11/04/20 1503 11/06/20 1343 11/06/20 2033 11/06/20 2215  NA 129* 132* 133* 130* 128*  K 4.6 4.2 4.6 4.5 4.0  CL 99 103 102 99 98  CO2 18* 21* 16* 21* 19*  GLUCOSE 495* 389* 406* 465* 476*  BUN 36* 33* 26* 25* 25*  CREATININE 1.88* 1.69* 1.75* 1.59* 1.66*  CALCIUM 8.6* 8.3* 9.3 9.1 9.2   Liver Function Tests: Recent Labs  Lab 11/04/20 1223  AST 22  ALT 58*  ALKPHOS 279*  BILITOT 1.1  PROT 7.6  ALBUMIN 3.3*   No results for input(s): LIPASE, AMYLASE in the last 168 hours. No results for input(s): AMMONIA in the last 168 hours. CBC: Recent Labs  Lab 11/04/20 1223 11/06/20 1343 11/06/20 2033  WBC 8.4 6.8 5.4  NEUTROABS 6.6 4.9 3.8  HGB 12.6* 12.6* 12.2*  HCT 37.6* 38.6* 36.2*  MCV 98.9 101.0* 99.7  PLT 385 410* 378   Cardiac Enzymes: No results for input(s): CKTOTAL, CKMB, CKMBINDEX, TROPONINI in the last 168 hours.  BNP (last 3 results) No results for input(s): BNP in the last 8760 hours.  ProBNP (last 3 results) No results for input(s): PROBNP in the last 8760 hours.  CBG: Recent Labs  Lab 11/06/20 2353 11/07/20 0031 11/07/20 0150 11/07/20 0238 11/07/20 0339  GLUCAP 407* 310* 234* 183* 177*    Radiological Exams on Admission: CT PELVIS W CONTRAST  Result Date: 11/06/2020 CLINICAL DATA:  Anal or rectal abscess. Pt. Admitted for blood sugar problems. Pt. Has known rectal abscess. EXAM: CT PELVIS WITH CONTRAST TECHNIQUE: Multidetector CT imaging of the pelvis was performed using the standard protocol following the bolus administration of intravenous contrast. CONTRAST:  39mL OMNIPAQUE IOHEXOL 300 MG/ML  SOLN COMPARISON:  None. FINDINGS: Urinary Tract:  No abnormality visualized. Bowel:  Unremarkable visualized pelvic bowel loops. Ureteral jet noted within urinary bladder on the right. Vascular/Lymphatic: Atherosclerotic plaque. No pathologically enlarged lymph nodes. No significant vascular abnormality seen. Reproductive: Prominent prostate. Otherwise no mass or other significant abnormality Other: No intraperitoneal free fluid. No intraperitoneal free gas. No organized fluid collection. Musculoskeletal: Medial right gluteal subcutaneus soft tissue edema and several foci of gas with no definite organized fluid collection. Edema noted to extend from the right perirectal region. No CT findings of perineal or scrotal involvement. No suspicious bone lesions identified. IMPRESSION: 1. Right perirectal region edema extending to the medial right gluteal subcutaneus soft tissues with several associated foci of gas. No definite organized fluid collection. Please note necrotizing fasciitis cannot be excluded as this is a clinical diagnosis. 2. Prominent prostate. 3.  Aortic Atherosclerosis (ICD10-I70.0). Electronically Signed   By: Iven Finn M.D.   On: 11/06/2020 23:35    EKG: I independently viewed the EKG done and my findings are as followed: EKG was not done in the ED  Assessment/Plan Present on Admission:  DKA (diabetic ketoacidosis) ([place])  Essential hypertension, benign  Hyperlipidemia  Hypothyroidism  Principal Problem:   DKA (diabetic ketoacidosis) ([place]) Active Problems:   Hyperlipidemia   Essential hypertension, benign   Hypothyroidism   Hyperglycemia due to diabetes mellitus ([place])   Perirectal abscess   Hyponatremia   Macrocytic anemia   AKI (acute kidney injury) ([place])   Dehydration  Anion gap metabolic acidosis secondary to DKA Hyperglycemia secondary to poorly controlled type 2 diabetes mellitus Beta hydroxybutyric acid 3.86> 1.89, CBG 476.   Continue insulin drip, IV LR with IV potassium per DKA protocol Transition IV LR to D5 LR when serum glucose reaches  250mg /dL Continue serial BMP and VBG Continue to monitor for anion gap closure prior to transitioning patient to subcu insulin Continue NPO  Right perirectal abscess with possible superimposed cellulitis rule out necrotizing fasciitis CT pelvis with contrast showed  Right perirectal region edema extending to the medial right gluteal subcutaneus soft tissues with several associated foci of gas Continue IV vancomycin General surgery was consulted and will see patient in the morning per ED physician  Hyponatremia induced hyperglycemia Na 128; corrected sodium level based on CBG of 476= 134 Continue to monitor sodium level with morning labs  Acute kidney injury/dehydration BUN to creatinine 25/1.66 (baseline creatinine at 1.1-1.2) Continue IV hydration Renally adjust medications, avoid nephrotoxic agents/dehydration/hypotension  Macrocytic anemia MCV 101.0, vitamin B12 and folate levels will be checked  Essential hypertension (controlled) No BP meds noted on patient's med rec,  we shall await updated med rec  Hyperlipidemia Continue Crestor when patient resumes oral intake  Hypothyroidism Continue Synthroid when patient resumes oral intake  DVT prophylaxis: SCDs (consider starting chemoprophylaxis if no need for surgical intervention in the morning)  Code Status: Full code  Family Communication: None at bedside  Disposition Plan:  Patient is from:                        home Anticipated DC to:                   SNF or family members home Anticipated DC date:               2-3 days Anticipated DC barriers:          Patient requires inpatient management due to DKA and perirectal abscess requiring inpatient management and pending surgical consult  Consults called: General surgery  Admission status: Inpatient    Bernadette Hoit MD [facility]  11/07/2020, 3:55 AM

## 2020-11-08 ENCOUNTER — Encounter (HOSPITAL_COMMUNITY): Payer: Self-pay | Admitting: Internal Medicine

## 2020-11-08 ENCOUNTER — Inpatient Hospital Stay (HOSPITAL_COMMUNITY): Payer: BC Managed Care – PPO

## 2020-11-08 DIAGNOSIS — D490 Neoplasm of unspecified behavior of digestive system: Secondary | ICD-10-CM

## 2020-11-08 DIAGNOSIS — E860 Dehydration: Secondary | ICD-10-CM

## 2020-11-08 DIAGNOSIS — K869 Disease of pancreas, unspecified: Secondary | ICD-10-CM

## 2020-11-08 DIAGNOSIS — R7989 Other specified abnormal findings of blood chemistry: Secondary | ICD-10-CM

## 2020-11-08 DIAGNOSIS — K831 Obstruction of bile duct: Secondary | ICD-10-CM

## 2020-11-08 DIAGNOSIS — E785 Hyperlipidemia, unspecified: Secondary | ICD-10-CM

## 2020-11-08 DIAGNOSIS — D649 Anemia, unspecified: Secondary | ICD-10-CM

## 2020-11-08 DIAGNOSIS — K8689 Other specified diseases of pancreas: Secondary | ICD-10-CM

## 2020-11-08 DIAGNOSIS — L03317 Cellulitis of buttock: Secondary | ICD-10-CM

## 2020-11-08 DIAGNOSIS — E0810 Diabetes mellitus due to underlying condition with ketoacidosis without coma: Secondary | ICD-10-CM

## 2020-11-08 DIAGNOSIS — K811 Chronic cholecystitis: Secondary | ICD-10-CM

## 2020-11-08 DIAGNOSIS — L0231 Cutaneous abscess of buttock: Secondary | ICD-10-CM

## 2020-11-08 DIAGNOSIS — R7401 Elevation of levels of liver transaminase levels: Secondary | ICD-10-CM

## 2020-11-08 LAB — COMPREHENSIVE METABOLIC PANEL
ALT: 198 U/L — ABNORMAL HIGH (ref 0–44)
AST: 111 U/L — ABNORMAL HIGH (ref 15–41)
Albumin: 2.7 g/dL — ABNORMAL LOW (ref 3.5–5.0)
Alkaline Phosphatase: 508 U/L — ABNORMAL HIGH (ref 38–126)
Anion gap: 8 (ref 5–15)
BUN: 14 mg/dL (ref 8–23)
CO2: 22 mmol/L (ref 22–32)
Calcium: 8.7 mg/dL — ABNORMAL LOW (ref 8.9–10.3)
Chloride: 106 mmol/L (ref 98–111)
Creatinine, Ser: 1.42 mg/dL — ABNORMAL HIGH (ref 0.61–1.24)
GFR, Estimated: 56 mL/min — ABNORMAL LOW (ref >60)
Glucose, Bld: 268 mg/dL — ABNORMAL HIGH (ref 70–99)
Potassium: 3.5 mmol/L (ref 3.5–5.1)
Sodium: 136 mmol/L (ref 135–145)
Total Bilirubin: 1.1 mg/dL (ref 0.3–1.2)
Total Protein: 6.1 g/dL — ABNORMAL LOW (ref 6.5–8.1)

## 2020-11-08 LAB — CBC
HCT: 34.4 % — ABNORMAL LOW (ref 39.0–52.0)
Hemoglobin: 11.7 g/dL — ABNORMAL LOW (ref 13.0–17.0)
MCH: 33.5 pg (ref 26.0–34.0)
MCHC: 34 g/dL (ref 30.0–36.0)
MCV: 98.6 fL (ref 80.0–100.0)
Platelets: 400 10*3/uL (ref 150–400)
RBC: 3.49 MIL/uL — ABNORMAL LOW (ref 4.22–5.81)
RDW: 13.6 % (ref 11.5–15.5)
WBC: 5 10*3/uL (ref 4.0–10.5)
nRBC: 0 % (ref 0.0–0.2)

## 2020-11-08 LAB — GLUCOSE, CAPILLARY
Glucose-Capillary: 277 mg/dL — ABNORMAL HIGH (ref 70–99)
Glucose-Capillary: 253 mg/dL — ABNORMAL HIGH (ref 70–99)
Glucose-Capillary: 308 mg/dL — ABNORMAL HIGH (ref 70–99)

## 2020-11-08 LAB — MAGNESIUM: Magnesium: 1.7 mg/dL (ref 1.7–2.4)

## 2020-11-08 IMAGING — MR MR 3D RECON AT SCANNER
9 of 14 series · 31 of 48 positions shown · non-contrast
Comparison: None.

CLINICAL DATA: Cholelithiasis, evaluate for choledocholithiasis

EXAM:
MRI ABDOMEN WITHOUT CONTRAST  (INCLUDING MRCP)
TECHNIQUE: Multiplanar multisequence MR imaging of the abdomen was performed.
Heavily T2-weighted images of the biliary and pancreatic ducts were
obtained, and three-dimensional MRCP images were rendered by post
processing.

[Series 4: ax haste · axial · 6.0mm · 0.74mm/px · z∈[-227,+25]mm · 4 of 36 slices shown]
[im 1/36]
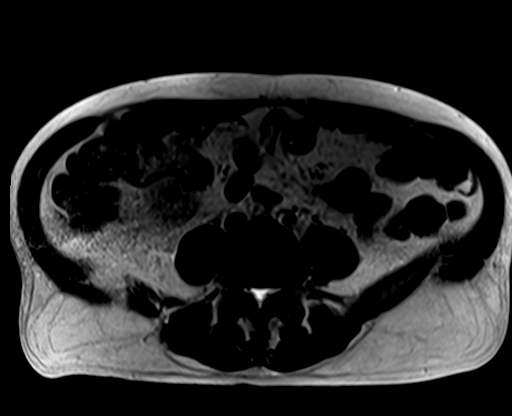
[im 12/36]
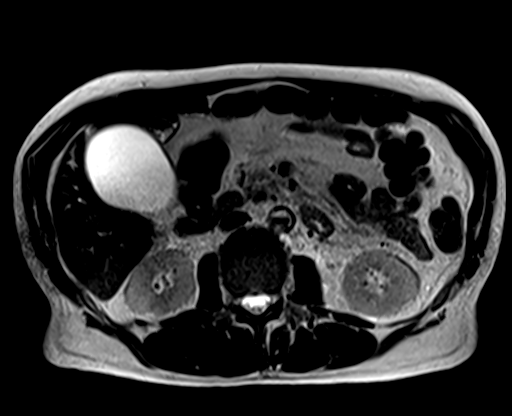
[im 24/36]
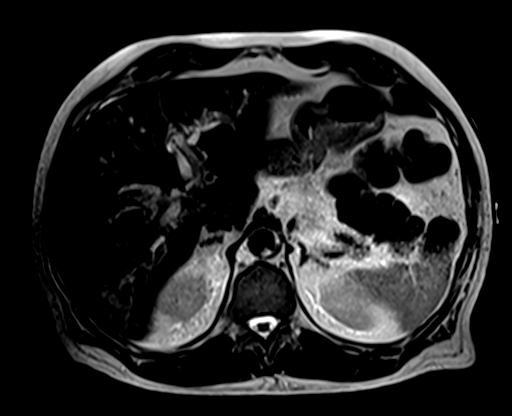
[im 36/36]
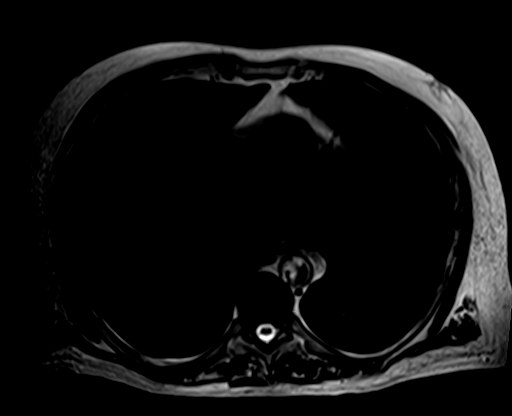

[Series 5: cor haste · coronal · 6.0mm · 1.25mm/px · 3 of 29 slices shown]
[im 1/29]
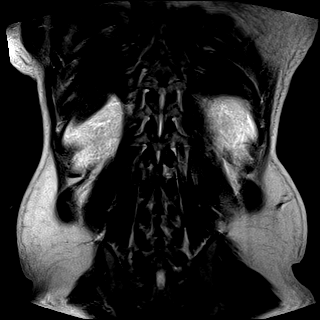
[im 15/29]
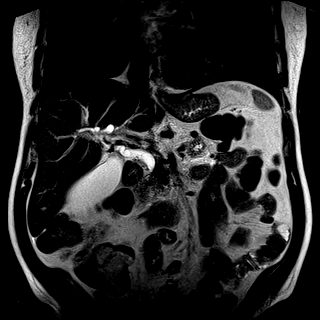
[im 29/29]
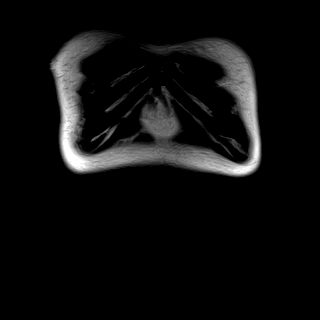

[Series 6: T2 fat-sat · axial · 6.0mm · 1.19mm/px · z∈[-227,+25]mm · 3 of 36 slices shown]
[im 1/36]
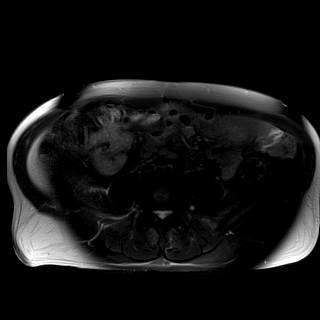
[im 18/36]
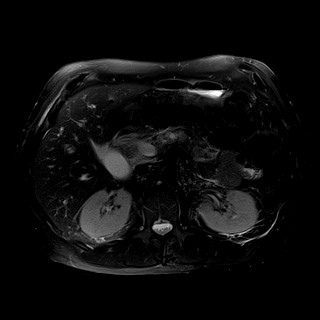
[im 36/36]
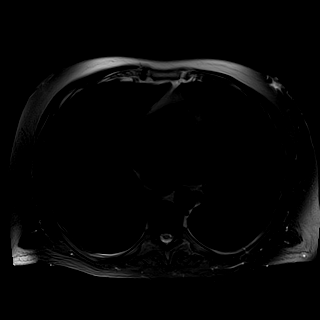

[Series 7: DWI · axial · 6.0mm · 1.42mm/px · z∈[-205,+3]mm · 2 of 30 slices shown (1 of 4)]
[im 1/30]
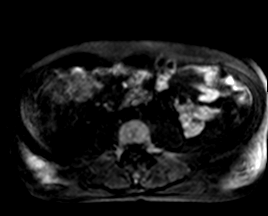
[im 30/30]
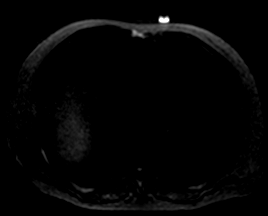

[Series 7: DWI · axial · 6.0mm · 1.42mm/px · z∈[-205,+3]mm · 2 of 30 slices shown (2 of 4)]
[im 1/30]
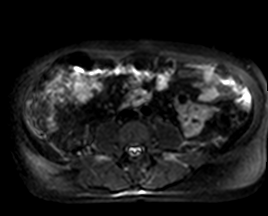
[im 30/30]
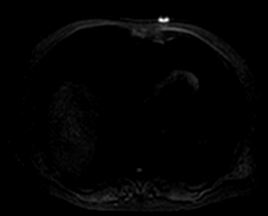

[Series 7: DWI · axial · 6.0mm · 1.42mm/px · z∈[-205,+3]mm · 2 of 30 slices shown (3 of 4)]
[im 1/30]
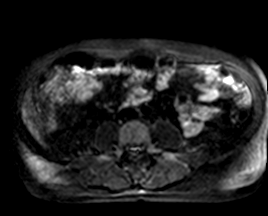
[im 30/30]
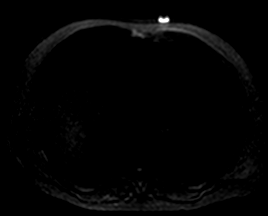

[Series 8: DWI · axial · 6.0mm · 1.42mm/px · z∈[-205,+3]mm · 2 of 30 slices shown (4 of 4)]
[im 1/30]
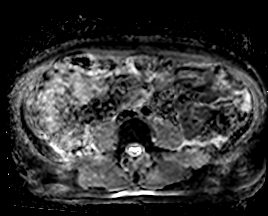
[im 30/30]
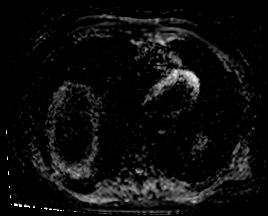

[Series 9: ax in and · axial · 3.0mm · 0.74mm/px · z∈[-245,+16]mm · 7 of 88 slices shown (1 of 2)]
[im 1/88]
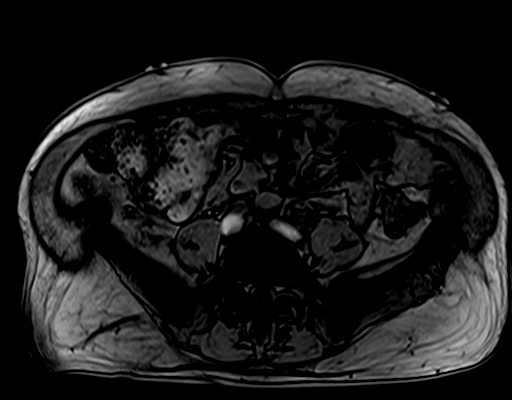
[im 15/88]
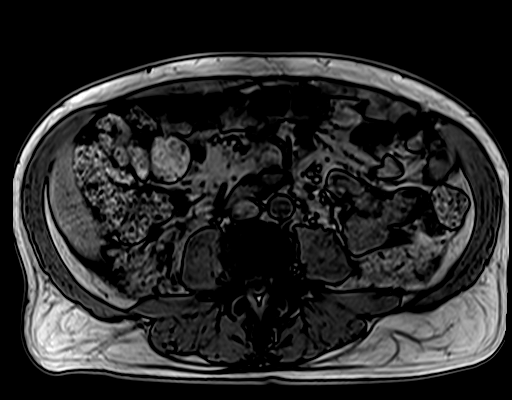
[im 30/88]
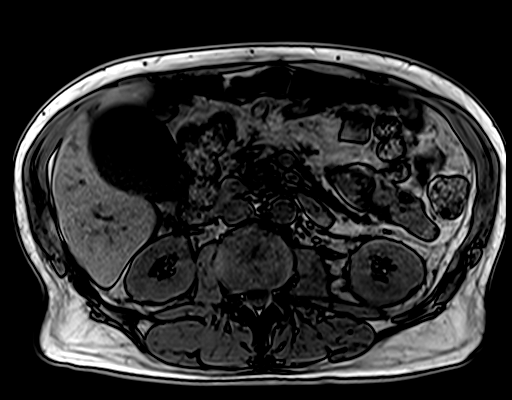
[im 44/88]
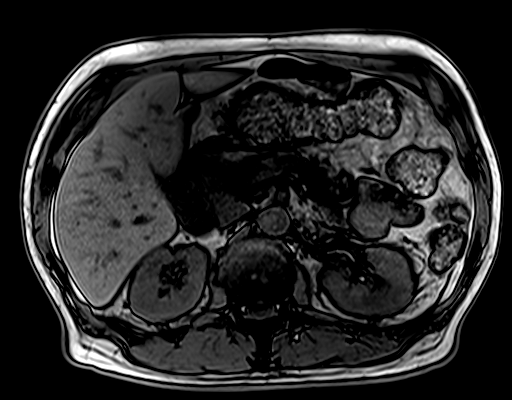
[im 59/88]
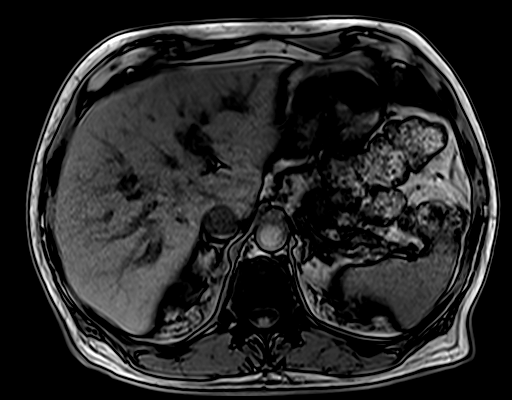
[im 73/88]
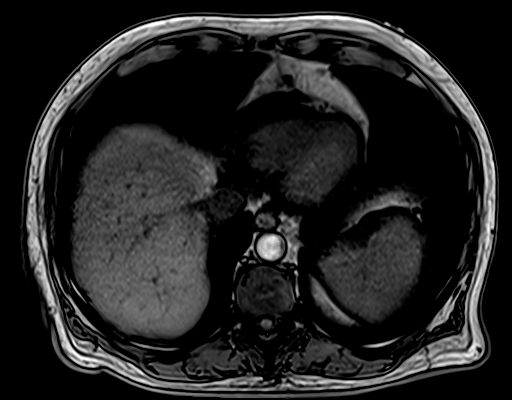
[im 88/88]
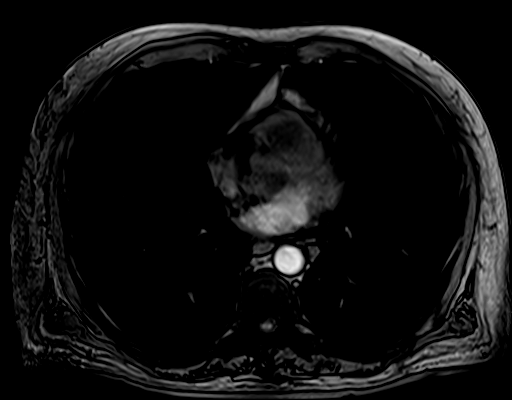

[Series 10: ax in and · axial · 3.0mm · 0.74mm/px · z∈[-245,-29]mm · 6 of 88 slices shown (2 of 2)]
[im 1/88]
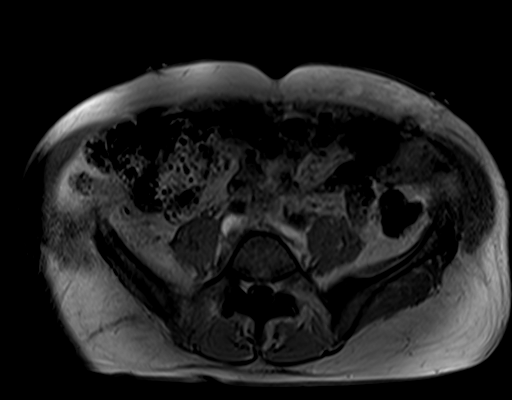
[im 15/88]
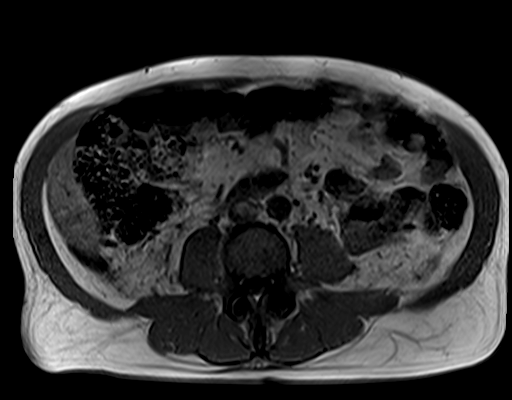
[im 30/88]
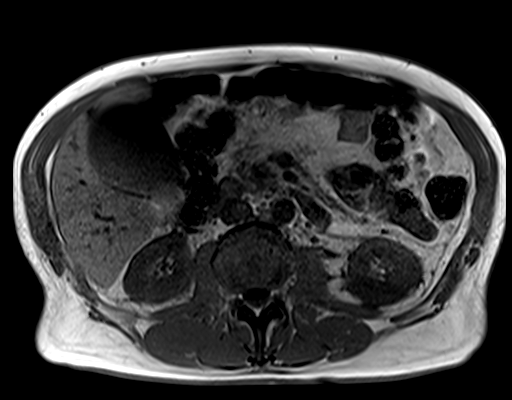
[im 44/88]
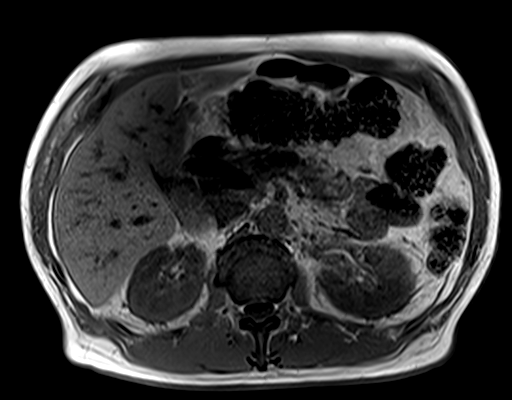
[im 59/88]
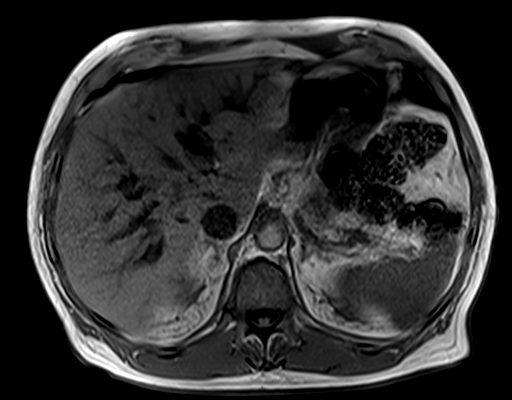
[im 73/88]
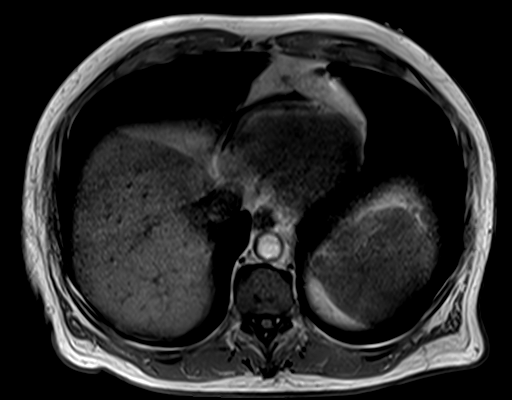

[31 of 48 positions shown; findings below may reference images not displayed]

FINDINGS: Lower chest: No acute findings.

Hepatobiliary: No mass or other parenchymal abnormality identified.
Intra and extrahepatic biliary ductal dilatation, with relatively
abrupt truncation of the central common bile duct just proximal to
the ampulla. The central common bile duct measures up to 1.1 cm in
caliber. Distended gallbladder.

Pancreas: There is diffuse pancreatic ductal dilatation in the
superior pancreatic head and distally, measuring up to 6 mm in
caliber. There is a somewhat ill-defined, T1 and T2 isointense mass
of the central pancreatic head and uncinate measuring approximately
2.3 x 2.0 cm (series 4, image 23, series 5, image 15).

Spleen:  Within normal limits in size and appearance.

Adrenals/Urinary Tract: No masses identified. No evidence of
hydronephrosis.

Stomach/Bowel: Visualized portions within the abdomen are
unremarkable.

Vascular/Lymphatic: No pathologically enlarged lymph nodes
identified. No abdominal aortic aneurysm demonstrated.

Other:  None.

Musculoskeletal: No suspicious bone lesions identified.
IMPRESSION: 1. There is a somewhat ill-defined, T1 and T2 isointense mass of the
central pancreatic head and uncinate measuring approximately 2.3 x
2.0 cm.
2. Intra and extrahepatic biliary ductal dilatation as well as a
distended gallbladder, with relatively abrupt truncation of the
central common bile duct just proximal to the ampulla.
3. There is also diffuse pancreatic ductal dilatation in the
superior pancreatic head and distally, with abrupt truncation in the
pancreatic head.
4. Constellation of findings is highly suspicious for pancreatic
adenocarcinoma. There is no obvious evidence of abdominal metastatic
disease on noncontrast MR.

## 2020-11-08 IMAGING — NM NM HEPATO W/GB/PHARM/[PERSON_NAME]
3 series · 13 of 13 positions shown · non-contrast
Comparison: Ultrasound abdomen [DATE], MRI/MRCP [DATE]

CLINICAL DATA: RIGHT upper quadrant pain, thickened gallbladder
wall with sludge question cholecystitis

EXAM:
NUCLEAR MEDICINE HEPATOBILIARY IMAGING
TECHNIQUE: Sequential images of the abdomen were obtained [DATE] minutes
following intravenous administration of radiopharmaceutical.
RADIOPHARMACEUTICALS:  4.8 mCi [C2]  Choletec IV

[Series 1: biliary · 3.25mm/px · 6 of 60 frames shown (1 of 2)]
[frame 6/60]
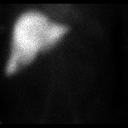
[frame 16/60]
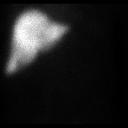
[frame 26/60]
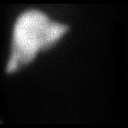
[frame 36/60]
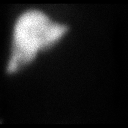
[frame 46/60]
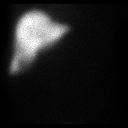
[frame 56/60]
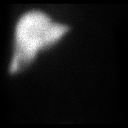

[Series 2: biliary · 3.25mm/px · 6 of 60 frames shown (2 of 2)]
[frame 6/60]
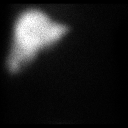
[frame 16/60]
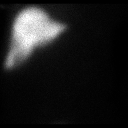
[frame 26/60]
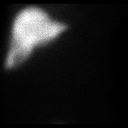
[frame 36/60]
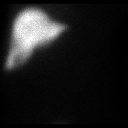
[frame 46/60]
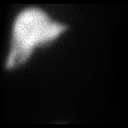
[frame 56/60]
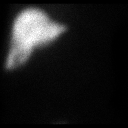

[Series 3: rt lat · 3.25mm/px · 1 of 1 slices shown]
[im 1/1]
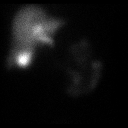

[13 of 13 positions shown; findings below may reference images not displayed]

FINDINGS: Mildly delayed clearance of tracer from blood pool indicating
component of hepatocellular dysfunction. Delayed excretion of tracer
into biliary tree. At 1 hour, all of the tracer remains within the
liver parenchyma. At 2 hours, all tracer remains within liver
parenchyma, without visualization of the biliary tree. Delayed image
at 4 hours reveals tracer within the small bowel indicating patency
of the common bile duct. Additionally, tracer is not visualized
within the gallbladder consistent with patent cystic duct.
IMPRESSION: Hepatocellular dysfunction.

Delayed visualization of tracer within the gallbladder, indicating
presence of a patent cystic duct and an absence of acute
cholecystitis; however, delayed visualization of the gallbladder can
be seen in patients with chronic cholecystitis.

## 2020-11-08 IMAGING — MR MR MRCP
9 of 12 series · 32 of 48 positions shown · non-contrast
Comparison: None.

CLINICAL DATA: Cholelithiasis, evaluate for choledocholithiasis

EXAM:
MRI ABDOMEN WITHOUT CONTRAST  (INCLUDING MRCP)
TECHNIQUE: Multiplanar multisequence MR imaging of the abdomen was performed.
Heavily T2-weighted images of the biliary and pancreatic ducts were
obtained, and three-dimensional MRCP images were rendered by post
processing.

[Series 4: ax haste · axial · 6.0mm · 0.74mm/px · z∈[-227,+25]mm · 4 of 36 slices shown]
[im 1/36]
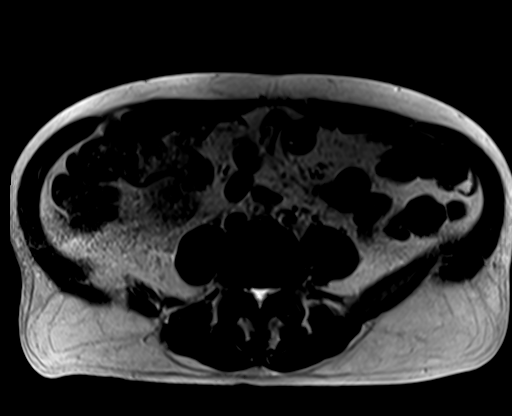
[im 12/36]
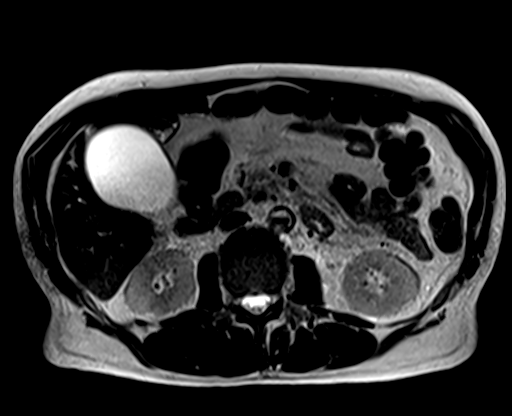
[im 24/36]
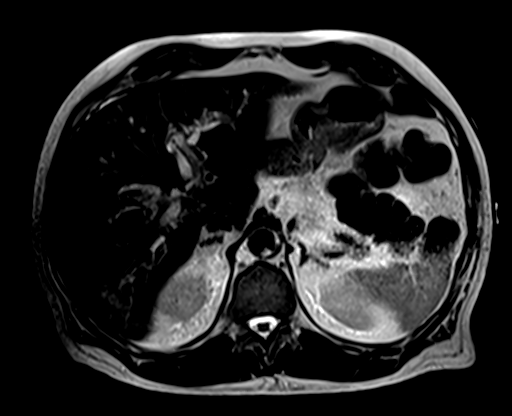
[im 36/36]
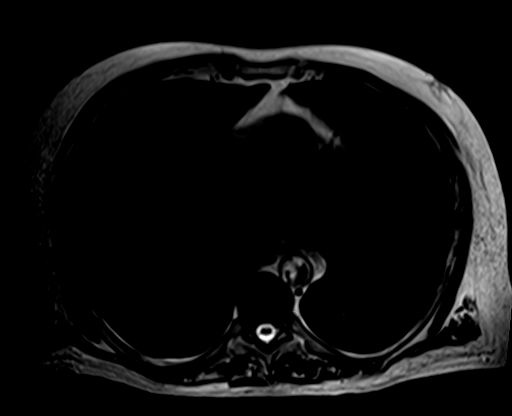

[Series 5: cor haste · coronal · 6.0mm · 1.25mm/px · 3 of 29 slices shown]
[im 1/29]
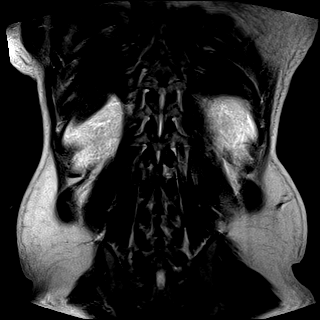
[im 15/29]
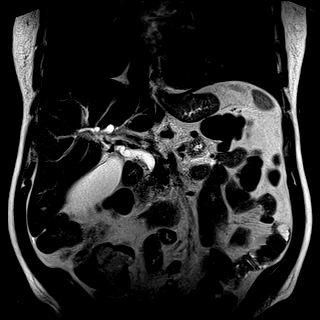
[im 29/29]
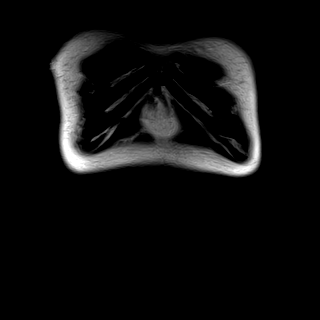

[Series 7: T2 fat-sat · axial · 6.0mm · 1.19mm/px · z∈[-227,+25]mm · 4 of 36 slices shown]
[im 1/36]
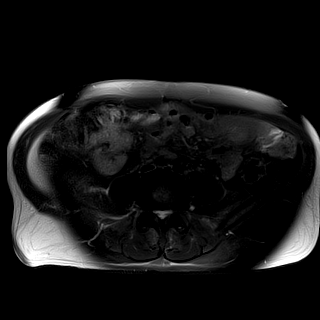
[im 12/36]
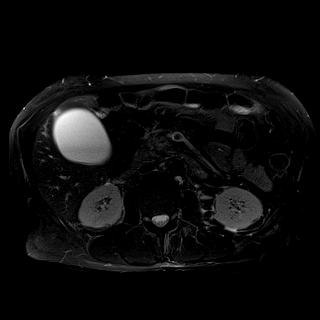
[im 24/36]
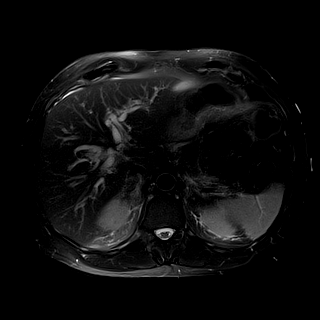
[im 36/36]
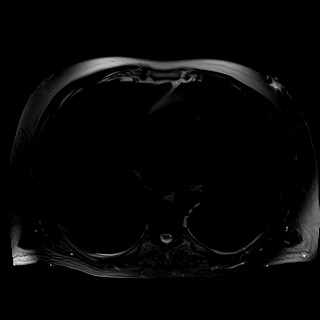

[Series 8: DWI · axial · 6.0mm · 1.42mm/px · z∈[-205,+3]mm · 2 of 30 slices shown (1 of 4)]
[im 1/30]
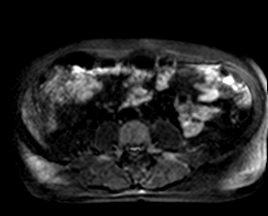
[im 30/30]
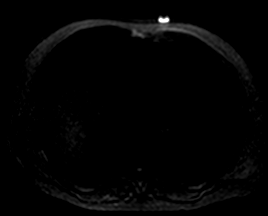

[Series 8: DWI · axial · 6.0mm · 1.42mm/px · z∈[-205,+3]mm · 2 of 30 slices shown (2 of 4)]
[im 1/30]
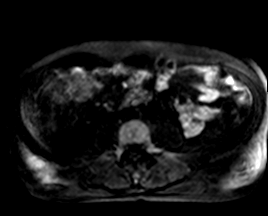
[im 30/30]
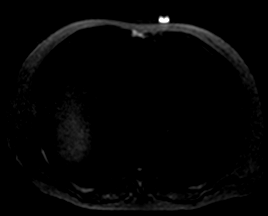

[Series 8: DWI · axial · 6.0mm · 1.42mm/px · z∈[-205,+3]mm · 2 of 30 slices shown (3 of 4)]
[im 1/30]
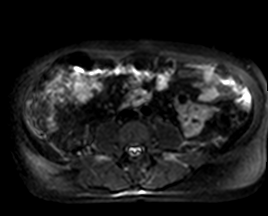
[im 30/30]
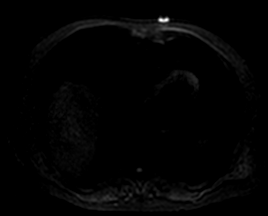

[Series 9: DWI · axial · 6.0mm · 1.42mm/px · z∈[-205,+3]mm · 2 of 30 slices shown (4 of 4)]
[im 1/30]
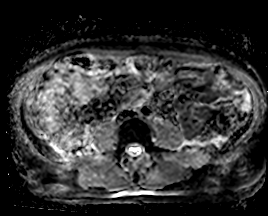
[im 30/30]
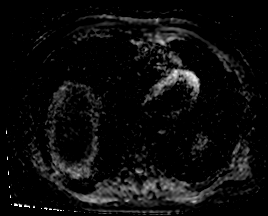

[Series 10: ax in and · axial · 3.0mm · 0.74mm/px · z∈[-245,+16]mm · 7 of 88 slices shown (1 of 2)]
[im 1/88]
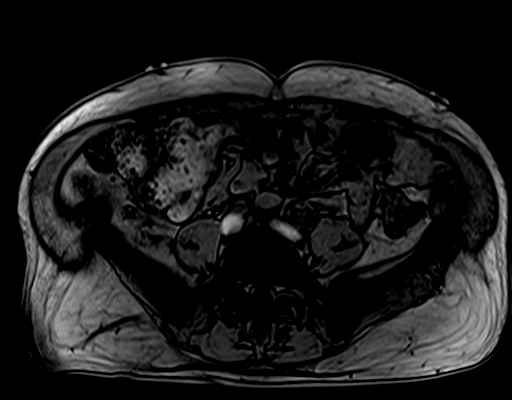
[im 15/88]
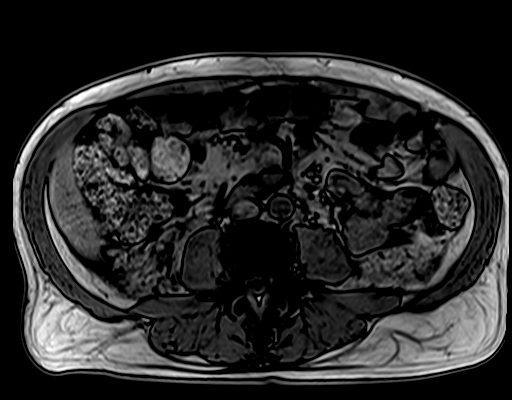
[im 30/88]
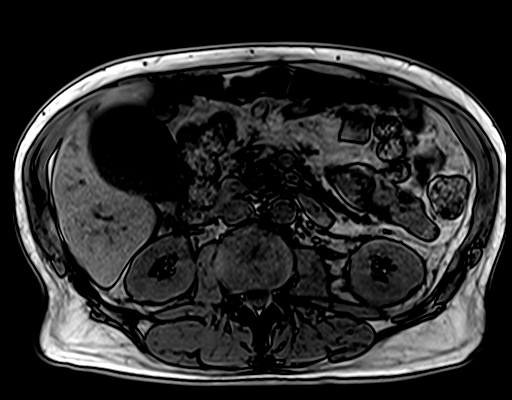
[im 44/88]
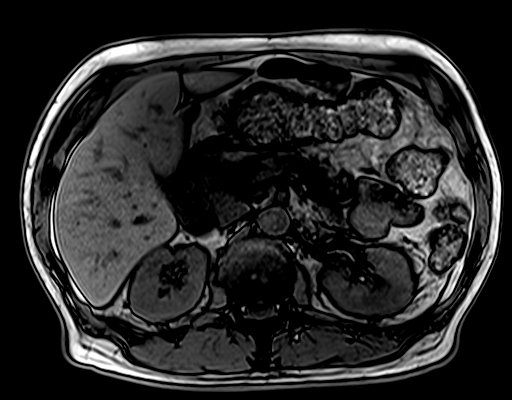
[im 59/88]
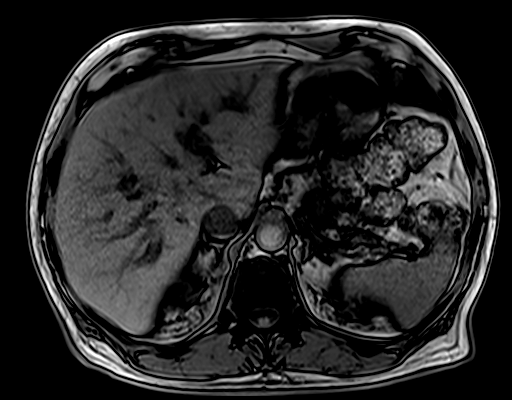
[im 73/88]
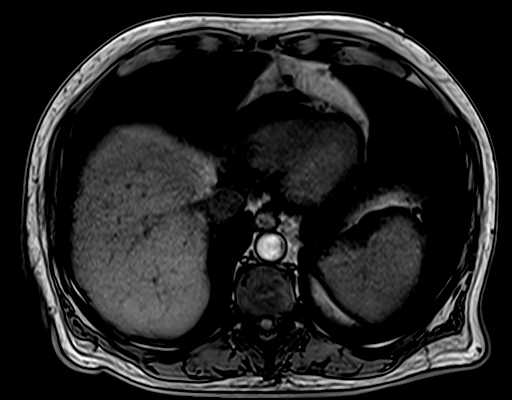
[im 88/88]
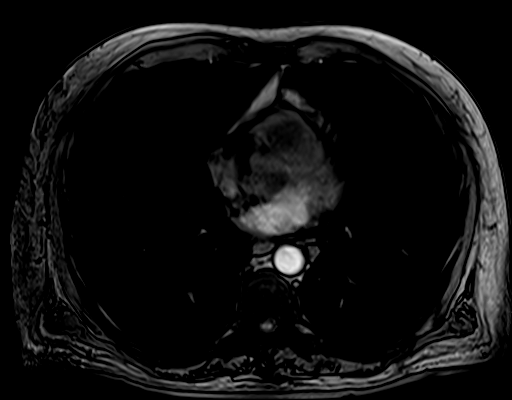

[Series 11: ax in and · axial · 3.0mm · 0.74mm/px · z∈[-245,-29]mm · 6 of 88 slices shown (2 of 2)]
[im 1/88]
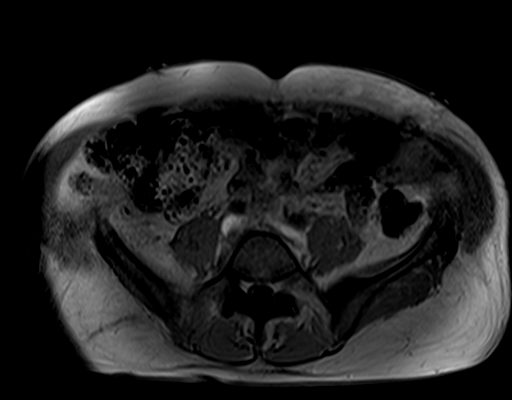
[im 15/88]
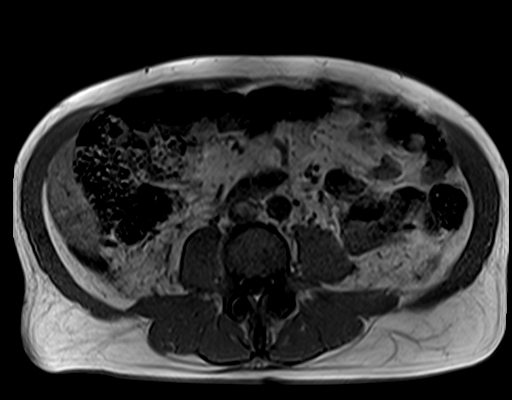
[im 30/88]
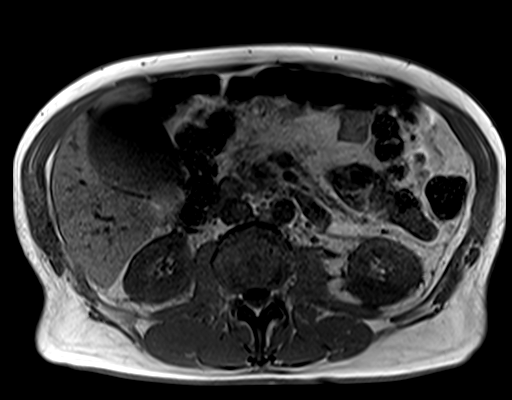
[im 44/88]
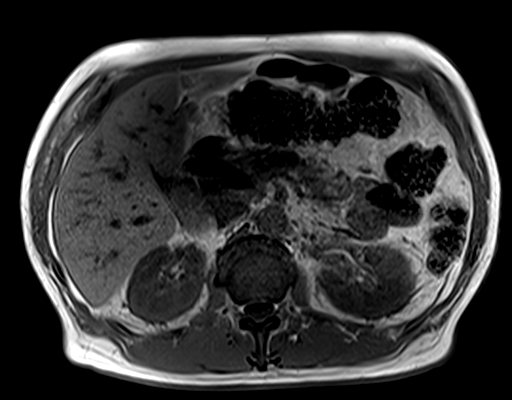
[im 59/88]
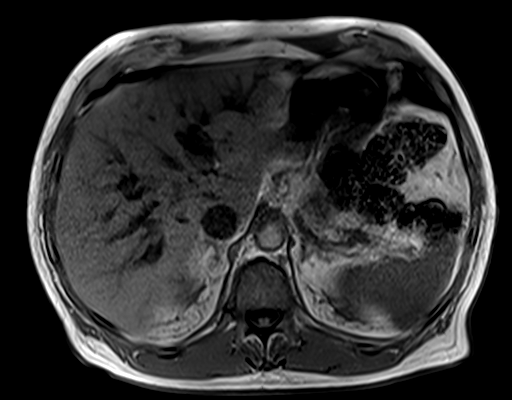
[im 73/88]
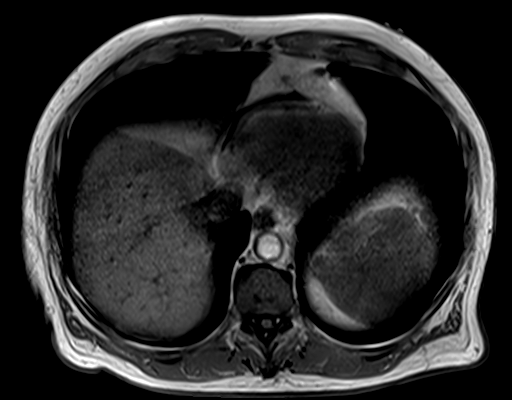

[32 of 48 positions shown; findings below may reference images not displayed]

FINDINGS: Lower chest: No acute findings.

Hepatobiliary: No mass or other parenchymal abnormality identified.
Intra and extrahepatic biliary ductal dilatation, with relatively
abrupt truncation of the central common bile duct just proximal to
the ampulla. The central common bile duct measures up to 1.1 cm in
caliber. Distended gallbladder.

Pancreas: There is diffuse pancreatic ductal dilatation in the
superior pancreatic head and distally, measuring up to 6 mm in
caliber. There is a somewhat ill-defined, T1 and T2 isointense mass
of the central pancreatic head and uncinate measuring approximately
2.3 x 2.0 cm (series 4, image 23, series 5, image 15).

Spleen:  Within normal limits in size and appearance.

Adrenals/Urinary Tract: No masses identified. No evidence of
hydronephrosis.

Stomach/Bowel: Visualized portions within the abdomen are
unremarkable.

Vascular/Lymphatic: No pathologically enlarged lymph nodes
identified. No abdominal aortic aneurysm demonstrated.

Other:  None.

Musculoskeletal: No suspicious bone lesions identified.
IMPRESSION: 1. There is a somewhat ill-defined, T1 and T2 isointense mass of the
central pancreatic head and uncinate measuring approximately 2.3 x
2.0 cm.
2. Intra and extrahepatic biliary ductal dilatation as well as a
distended gallbladder, with relatively abrupt truncation of the
central common bile duct just proximal to the ampulla.
3. There is also diffuse pancreatic ductal dilatation in the
superior pancreatic head and distally, with abrupt truncation in the
pancreatic head.
4. Constellation of findings is highly suspicious for pancreatic
adenocarcinoma. There is no obvious evidence of abdominal metastatic
disease on noncontrast MR.

## 2020-11-08 MED ORDER — INSULIN ASPART 100 UNIT/ML IJ SOLN
8.0000 [IU] | Freq: Three times a day (TID) | INTRAMUSCULAR | Status: DC
  Administered 2020-11-08: 8 [IU] via SUBCUTANEOUS

## 2020-11-08 MED ORDER — MAGNESIUM SULFATE 2 GM/50ML IV SOLN
2.0000 g | Freq: Once | INTRAVENOUS | Status: AC
  Administered 2020-11-08: 2 g via INTRAVENOUS
  Filled 2020-11-08: qty 50

## 2020-11-08 MED ORDER — TECHNETIUM TC 99M MEBROFENIN IV KIT
5.0000 | PACK | Freq: Once | INTRAVENOUS | Status: AC | PRN
  Administered 2020-11-08: 4.8 via INTRAVENOUS

## 2020-11-08 MED ORDER — INSULIN GLARGINE 100 UNIT/ML ~~LOC~~ SOLN
24.0000 [IU] | Freq: Every day | SUBCUTANEOUS | Status: DC
  Administered 2020-11-08: 24 [IU] via SUBCUTANEOUS
  Filled 2020-11-08 (×4): qty 0.24

## 2020-11-08 MED ORDER — POTASSIUM CHLORIDE CRYS ER 20 MEQ PO TBCR
40.0000 meq | EXTENDED_RELEASE_TABLET | Freq: Two times a day (BID) | ORAL | Status: AC
  Administered 2020-11-08 (×2): 40 meq via ORAL
  Filled 2020-11-08 (×2): qty 2

## 2020-11-08 NOTE — Consult Note (Signed)
Referring Provider: Irwin Brakeman, MD Primary Care Physician:  [name], MD Primary Gastroenterologist:  Dr. [name]  Reason for Consultation:    Biliary dilation with distal obstruction and pancreatic head mass  HPI:   Patient is 65 year old Caucasian male who was admitted to hospitalist service for DKA.  About 10 days ago he developed perirectal abscess which spontaneously drained and he has been on antibiotics and improving.  Lab studies on 11/07/2020 revealed a bilirubin of 1.7 AP of 596 AST of 434 and ALT of 21.  Patient was treated with IV fluids and insulin with resolution of DKA. Abdominal ultrasound was obtained which revealed dilated bile duct measuring 7 to 10 mm distended gallbladder with layering sludge and mild gallbladder wall thickening.  Ultrasound also suggested fatty liver.  He was also evaluated with Dr. Blake Divine surgical service regarding perianal abscess and she recommended antibiotic therapy without I&D as it had spontaneously ruptured.  HIDA scan was obtained by Dr. [name] Tat and ruled out acute cholecystitis. Patient underwent MR abdomen earlier today which revealed dilated intra and extrahepatic biliary system with abrupt cut off and distended gallbladder.  There was 2 x 2.3 cm pancreatic head mass and dilated pancreatic duct raising concern for pancreatic carcinoma with biliary and pancreatic obstruction hence reason for consultation. Patient denies abdominal pain pruritus nausea vomiting.  He states he has been losing weight since January this year.  Initially he thought his weight loss was voluntary but then he realized his weight loss was occurring at a rapid pace.  He also has noted decrease in appetite which started 6 weeks ago and he has had extremely poor appetite over the last 2 weeks.  He has not experienced fever or chills or night sweats.  His bowels have been moving regularly.  He denies diarrhea melena or rectal bleeding. He had screening  colonoscopy in May 2019 by Dr. Chancy Milroy revealing sigmoid diverticulosis.  Patient is married.  He has 2 sons ages 79 and 69 in good health.  He works as a Investment banker, corporate.  He has been working with his company for 40 years.  He says he has not had tremendous exposure to ash.  He smoked cigarettes for about 25 years no more than half a pack a day for the last 10 years he has been smoking cigars no more than 3 to 4/day.  He drinks alcohol occasionally.  Father died of MI at age 88 mother lived to be 36.  He had 1 brother who died of MI at age 39 in [place] his sister of some type of malignancy at age 45.   Past Medical History:  Diagnosis Date   Anemia, iron deficiency    Diabetes mellitus without complication ([facility])    on meds   Hearing loss    Bil hearing aids   Hyperlipidemia    Hypertension    Prediabetes    Sleep apnea     Past Surgical History:  Procedure Laterality Date   COLONOSCOPY     GIVENS CAPSULE STUDY     KNEE SURGERY     4 surgeries on left, 2 on right knee   SHOULDER SURGERY     right shoulder    Prior to Admission medications   Medication Sig Start Date End Date Taking? Authorizing Provider  amoxicillin-clavulanate (AUGMENTIN) 875-125 MG tablet Take 1 tablet by mouth every 12 (twelve) hours for 7 days. 11/03/20 11/10/20 Yes Wurst, [place], PA-C  Ferrous Fumarate (HEMOCYTE -  106 MG FE) 324 (106 Fe) MG TABS tablet Take 1 tablet by mouth daily.   Yes [provider]  insulin detemir (LEVEMIR) 100 UNIT/ML FlexPen 8 units may taper up to 30 units upon direction of physician 11/04/20  Yes [name], MD  insulin lispro (HUMALOG KWIKPEN) 100 UNIT/ML KwikPen 5 units with lunch and dinner as directed by physician 11/04/20  Yes [name], MD  levothyroxine (SYNTHROID) 175 MCG tablet TAKE 1 qd Patient taking differently: Take 175 mcg by mouth daily before breakfast. 07/25/20  Yes Luking, Elayne Snare, MD  metroNIDAZOLE (FLAGYL) 500 MG tablet Take  1 tablet (500 mg total) by mouth 2 (two) times daily. 11/03/20  Yes Wurst, [place], PA-C  Nutritional Supplements (OSTEO ADVANCE PO) Take 1 tablet by mouth daily.   Yes [provider]  rosuvastatin (CRESTOR) 40 MG tablet TAKE 1 TABLET DAILY (STOP ATORVASTATIN) Patient taking differently: 40 mg daily. 07/25/20  Yes [name], MD  glucose blood (ONETOUCH VERIO) test strip TEST ONE TIME DAILY 11/30/19   [name], MD  OneTouch Delica Lancets 34H MISC TEST ONE TIME DAILY 11/30/19   [name], MD    Current Facility-Administered Medications  Medication Dose Route Frequency Provider Last Rate Last Admin   cefTRIAXone (ROCEPHIN) 2 g in sodium chloride 0.9 % 100 mL IVPB  2 g Intravenous Q24H Tat, [name], MD 200 mL/hr at 11/08/20 1637 2 g at 11/08/20 1637   Chlorhexidine Gluconate Cloth 2 % PADS 6 each  6 each Topical Daily Adefeso, Oladapo, DO   6 each at 11/07/20 0955   dextrose 50 % solution 0-50 mL  0-50 mL Intravenous PRN Adefeso, Oladapo, DO       insulin aspart (novoLOG) injection 0-15 Units  0-15 Units Subcutaneous TID WC Orson Eva, MD   15 Units at 11/08/20 1634   insulin aspart (novoLOG) injection 0-5 Units  0-5 Units Subcutaneous QHS Orson Eva, MD   3 Units at 11/07/20 2133   insulin aspart (novoLOG) injection 8 Units  8 Units Subcutaneous TID WC [name], [name], MD   8 Units at 11/08/20 1635   insulin glargine (LANTUS) injection 24 Units  24 Units Subcutaneous Daily Wynetta Emery, [name], MD   24 Units at 11/08/20 1222   lactated ringers infusion   Intravenous Continuous [name], [name], MD 75 mL/hr at 11/08/20 1754 New Bag at 11/08/20 1754   potassium chloride SA (KLOR-CON) CR tablet 40 mEq  40 mEq Oral BID Wynetta Emery, [name], MD   40 mEq at 11/08/20 1323   vancomycin (VANCOCIN) IVPB 1000 mg/200 mL premix  1,000 mg Intravenous Q12H Erenest Blank, RPH 200 mL/hr at 11/08/20 1056 1,000 mg at 11/08/20 1056    Allergies as of 11/06/2020 - Review Complete  11/06/2020  Allergen Reaction Noted   Lisinopril  10/17/2012    Family History  Problem Relation Age of Onset   Heart attack Father    Heart attack Paternal Grandfather    Leukemia Sister    Heart attack Brother     Social History   Socioeconomic History   Marital status: Married    Spouse name: Not on file   Number of children: Not on file   Years of education: Not on file   Highest education level: Not on file  Occupational History   Not on file  Tobacco Use   Smoking status: Every Day    Pack years: 0.00    Types: Cigars  Smokeless tobacco: Never   Tobacco comments:    Smoke 3-4 cigars a day  Vaping Use   Vaping Use: Never used  Substance and Sexual Activity   Alcohol use: Yes    Comment: occasional   Drug use: Never   Sexual activity: Not on file  Other Topics Concern   Not on file  Social History Narrative   Not on file   Social Determinants of Health   Financial Resource Strain: Not on file  Food Insecurity: Not on file  Transportation Needs: Not on file  Physical Activity: Not on file  Stress: Not on file  Social Connections: Not on file  Intimate Partner Violence: Not on file    Review of Systems: See HPI, otherwise normal ROS  Physical Exam: Temp:  [98.2 F (36.8 C)-98.6 F (37 C)] 98.6 F (37 C) (06/22 1411) Pulse Rate:  [79-89] 86 (06/22 1411) Resp:  [16-22] 17 (06/22 1411) BP: (144-155)/(78-87) 146/78 (06/22 1411) SpO2:  [97 %-99 %] 97 % (06/22 1411) Last BM Date: 11/05/20  Patient is alert and in no acute distress. Conjunctiva is pink.  Sclera is nonicteric. Oropharyngeal mucosa is normal. Dentition in satisfactory condition. Neck without masses thyromegaly or lymphadenopathy. Cardiac exam with regular rhythm normal S1 and S2.  No murmur gallop noted. Auscultation lungs reveal vesicular breath sounds bilaterally without rales or rhonchi. Abdomen is symmetrical.  Bowel sounds are normal.  On palpation it is soft and nontender with  organomegaly or masses. No peripheral edema or clubbing noted. Rectal examination was limited to external inspection.  He has small opening containing mucopurulent material to the right of anal orifice.     Lab Results: Recent Labs    11/06/20 2033 11/07/20 0637 11/08/20 0456  WBC 5.4 5.6 5.0  HGB 12.2* 11.5* 11.7*  HCT 36.2* 34.3* 34.4*  PLT 378 396 400   BMET Recent Labs    11/07/20 0702 11/07/20 1027 11/08/20 0456  NA 136 135 136  K 3.5 3.6 3.5  CL 105 105 106  CO2 23 24 22   GLUCOSE 151* 171* 268*  BUN 20 18 14   CREATININE 1.44* 1.45* 1.42*  CALCIUM 8.7* 8.6* 8.7*   LFT Recent Labs    11/08/20 0456  PROT 6.1*  ALBUMIN 2.7*  AST 111*  ALT 198*  ALKPHOS 508*  BILITOT 1.1   PT/INR Recent Labs    11/07/20 0637  LABPROT 13.1  INR 1.0   Hepatitis Panel Recent Labs    11/07/20 0638  HEPBSAG NON REACTIVE  HCVAB NON REACTIVE    Studies/Results: CT PELVIS W CONTRAST  Result Date: 11/06/2020 CLINICAL DATA:  Anal or rectal abscess. Pt. Admitted for blood sugar problems. Pt. Has known rectal abscess. EXAM: CT PELVIS WITH CONTRAST TECHNIQUE: Multidetector CT imaging of the pelvis was performed using the standard protocol following the bolus administration of intravenous contrast. CONTRAST:  98mL OMNIPAQUE IOHEXOL 300 MG/ML  SOLN COMPARISON:  None. FINDINGS: Urinary Tract:  No abnormality visualized. Bowel: Unremarkable visualized pelvic bowel loops. Ureteral jet noted within urinary bladder on the right. Vascular/Lymphatic: Atherosclerotic plaque. No pathologically enlarged lymph nodes. No significant vascular abnormality seen. Reproductive: Prominent prostate. Otherwise no mass or other significant abnormality Other: No intraperitoneal free fluid. No intraperitoneal free gas. No organized fluid collection. Musculoskeletal: Medial right gluteal subcutaneus soft tissue edema and several foci of gas with no definite organized fluid collection. Edema noted to extend from  the right perirectal region. No CT findings of perineal or scrotal involvement. No suspicious  bone lesions identified. IMPRESSION: 1. Right perirectal region edema extending to the medial right gluteal subcutaneus soft tissues with several associated foci of gas. No definite organized fluid collection. Please note necrotizing fasciitis cannot be excluded as this is a clinical diagnosis. 2. Prominent prostate. 3.  Aortic Atherosclerosis (ICD10-I70.0). Electronically Signed   By: Iven Finn M.D.   On: 11/06/2020 23:35   MR ABDOMEN MRCP WO CONTRAST  Result Date: 11/08/2020 CLINICAL DATA:  Cholelithiasis, evaluate for choledocholithiasis EXAM: MRI ABDOMEN WITHOUT CONTRAST  (INCLUDING MRCP) TECHNIQUE: Multiplanar multisequence MR imaging of the abdomen was performed. Heavily T2-weighted images of the biliary and pancreatic ducts were obtained, and three-dimensional MRCP images were rendered by post processing. COMPARISON:  None. FINDINGS: Lower chest: No acute findings. Hepatobiliary: No mass or other parenchymal abnormality identified. Intra and extrahepatic biliary ductal dilatation, with relatively abrupt truncation of the central common bile duct just proximal to the ampulla. The central common bile duct measures up to 1.1 cm in caliber. Distended gallbladder. Pancreas: There is diffuse pancreatic ductal dilatation in the superior pancreatic head and distally, measuring up to 6 mm in caliber. There is a somewhat ill-defined, T1 and T2 isointense mass of the central pancreatic head and uncinate measuring approximately 2.3 x 2.0 cm (series 4, image 23, series 5, image 15). Spleen:  Within normal limits in size and appearance. Adrenals/Urinary Tract: No masses identified. No evidence of hydronephrosis. Stomach/Bowel: Visualized portions within the abdomen are unremarkable. Vascular/Lymphatic: No pathologically enlarged lymph nodes identified. No abdominal aortic aneurysm demonstrated. Other:  None.  Musculoskeletal: No suspicious bone lesions identified. IMPRESSION: 1. There is a somewhat ill-defined, T1 and T2 isointense mass of the central pancreatic head and uncinate measuring approximately 2.3 x 2.0 cm. 2. Intra and extrahepatic biliary ductal dilatation as well as a distended gallbladder, with relatively abrupt truncation of the central common bile duct just proximal to the ampulla. 3. There is also diffuse pancreatic ductal dilatation in the superior pancreatic head and distally, with abrupt truncation in the pancreatic head. 4. Constellation of findings is highly suspicious for pancreatic adenocarcinoma. There is no obvious evidence of abdominal metastatic disease on noncontrast MR. Electronically Signed   By: Eddie Candle M.D.   On: 11/08/2020 11:35   MR 3D Recon At Scanner  Result Date: 11/08/2020 CLINICAL DATA:  Cholelithiasis, evaluate for choledocholithiasis EXAM: MRI ABDOMEN WITHOUT CONTRAST  (INCLUDING MRCP) TECHNIQUE: Multiplanar multisequence MR imaging of the abdomen was performed. Heavily T2-weighted images of the biliary and pancreatic ducts were obtained, and three-dimensional MRCP images were rendered by post processing. COMPARISON:  None. FINDINGS: Lower chest: No acute findings. Hepatobiliary: No mass or other parenchymal abnormality identified. Intra and extrahepatic biliary ductal dilatation, with relatively abrupt truncation of the central common bile duct just proximal to the ampulla. The central common bile duct measures up to 1.1 cm in caliber. Distended gallbladder. Pancreas: There is diffuse pancreatic ductal dilatation in the superior pancreatic head and distally, measuring up to 6 mm in caliber. There is a somewhat ill-defined, T1 and T2 isointense mass of the central pancreatic head and uncinate measuring approximately 2.3 x 2.0 cm (series 4, image 23, series 5, image 15). Spleen:  Within normal limits in size and appearance. Adrenals/Urinary Tract: No masses identified. No  evidence of hydronephrosis. Stomach/Bowel: Visualized portions within the abdomen are unremarkable. Vascular/Lymphatic: No pathologically enlarged lymph nodes identified. No abdominal aortic aneurysm demonstrated. Other:  None. Musculoskeletal: No suspicious bone lesions identified. IMPRESSION: 1. There is a somewhat ill-defined, T1 and  T2 isointense mass of the central pancreatic head and uncinate measuring approximately 2.3 x 2.0 cm. 2. Intra and extrahepatic biliary ductal dilatation as well as a distended gallbladder, with relatively abrupt truncation of the central common bile duct just proximal to the ampulla. 3. There is also diffuse pancreatic ductal dilatation in the superior pancreatic head and distally, with abrupt truncation in the pancreatic head. 4. Constellation of findings is highly suspicious for pancreatic adenocarcinoma. There is no obvious evidence of abdominal metastatic disease on noncontrast MR. Electronically Signed   By: Eddie Candle M.D.   On: 11/08/2020 11:35   NM Hepato W/EF  Result Date: 11/08/2020 CLINICAL DATA:  RIGHT upper quadrant pain, thickened gallbladder wall with sludge question cholecystitis EXAM: NUCLEAR MEDICINE HEPATOBILIARY IMAGING TECHNIQUE: Sequential images of the abdomen were obtained out to 60 minutes following intravenous administration of radiopharmaceutical. RADIOPHARMACEUTICALS:  4.8 mCi Tc-69m  Choletec IV COMPARISON:  Ultrasound abdomen 11/07/2020, MRI/MRCP 11/08/2020 FINDINGS: Mildly delayed clearance of tracer from blood pool indicating component of hepatocellular dysfunction. Delayed excretion of tracer into biliary tree. At 1 hour, all of the tracer remains within the liver parenchyma. At 2 hours, all tracer remains within liver parenchyma, without visualization of the biliary tree. Delayed image at 4 hours reveals tracer within the small bowel indicating patency of the common bile duct. Additionally, tracer is not visualized within the gallbladder  consistent with patent cystic duct. IMPRESSION: Hepatocellular dysfunction. Delayed visualization of tracer within the gallbladder, indicating presence of a patent cystic duct and an absence of acute cholecystitis; however, delayed visualization of the gallbladder can be seen in patients with chronic cholecystitis. Electronically Signed   By: Lavonia Dana M.D.   On: 11/08/2020 13:12   US Abdomen Limited RUQ (LIVER/GB)  Result Date: 11/07/2020 CLINICAL DATA:  Elevated LFTs. EXAM: ULTRASOUND ABDOMEN LIMITED RIGHT UPPER QUADRANT COMPARISON:  None. FINDINGS: Gallbladder: Gallbladder is distended with layering sludge. Gallbladder wall thickness upper normal to mildly increased at 3-4 mm. No appreciable pericholecystic fluid. The sonographer reports no sonographic Murphy sign. Common bile duct: Diameter: Dilated up to 7-10 mm diameter. Liver: Increased echogenicity suggests fatty deposition. Mild intrahepatic biliary duct dilatation evident. Portal vein is patent on color Doppler imaging with normal direction of blood flow towards the liver. Other: None. IMPRESSION: 1. Distended gallbladder with borderline to mild gallbladder wall thickening. No gallstones evident by ultrasound. If there is clinical concern for cystic duct obstruction, nuclear scintigraphy may prove helpful to further evaluate. Given the biliary dilatation, MRI/MRCP may also prove helpful. 2. Probable hepatic steatosis. Electronically Signed   By: Misty Stanley M.D.   On: 11/07/2020 15:12    Ultrasound and MRI reviewed.  Findings as above.  Assessment;  Patient is 65 year old Caucasian male who was hospitalized for DKA and noted to have abnormal LFTs.  History significant for 50 pound weight loss occurring over a period of 6 months.  He has not experienced pruritus diarrhea or abdominal pain.  Ultrasound and MR of abdomen revealed distended gallbladder with wall thickening dilated intra and extrahepatic biliary system with distal cut off  consistent with a stricture as well as 2 x 2.3 cm mass in pancreatic head along with dilated pancreatic duct.  These findings are concerning for pancreatic adenocarcinoma.  Other etiologies are less likely. No evidence of lymphadenopathy.  Potentially we are dealing with resectable/curable lesion. He will need biliary stenting and EUS with FNA. I doubt that he can have ERCP and EUS at the same time on a short notice.  Mild anemia possibly due to chronic disease.  He is up-to-date on CRC screening.   Recommendations;  Repeat LFTs in AM. Will check with Dr. Rush Landmark if his schedule would allow him to do ERCP and EUS at the same time otherwise I will plan ERCP with biliary cytology and stenting during this admission. Further recommendations to follow.   LOS: 1 day   Dorotea Hand  11/08/2020, 8:28 PM

## 2020-11-08 NOTE — Progress Notes (Signed)
[facility] Progress Note     Subjective: Perianal pain improved. Continued drainage. No complaints. No RUQ pain. Had elevated LFTs so Dr. Wynetta Emery working up with [place], HIDA and MRCP today.  Results not back when I saw him but MRCP concerning for pancreatic cancer. HIDA with possible chronic cholecystitis but nothing acute.   He has been eating and tolerating a diet and no pain.   Objective: Vital signs in last 24 hours: Temp:  [98.2 F (36.8 C)-98.3 F (36.8 C)] 98.2 F (36.8 C) (06/22 0536) Pulse Rate:  [77-89] 89 (06/22 0536) Resp:  [8-22] 16 (06/22 0536) BP: (141-155)/(66-87) 144/87 (06/22 0536) SpO2:  [96 %-99 %] 97 % (06/22 0536) Last BM Date: 11/05/20  Intake/Output from previous day: 06/21 0701 - 06/22 0700 In: 1888.5 [I.V.:1587.6; IV Piggyback:300.9] Out: 1150 [Urine:1150] Intake/Output this shift: No intake/output data recorded.  General appearance: alert, cooperative, and no distress Resp: normal work of breathing GI: soft, nondistended, no RUQ tenderness, perianal exam with softening induration right of anal region, drainage   Lab Results:  Recent Labs    11/07/20 0637 11/08/20 0456  WBC 5.6 5.0  HGB 11.5* 11.7*  HCT 34.3* 34.4*  PLT 396 400   BMET Recent Labs    11/07/20 1027 11/08/20 0456  NA 135 136  K 3.6 3.5  CL 105 106  CO2 24 22  GLUCOSE 171* 268*  BUN 18 14  CREATININE 1.45* 1.42*  CALCIUM 8.6* 8.7*   PT/INR Recent Labs    11/07/20 0637  LABPROT 13.1  INR 1.0    Studies/Results: CT PELVIS W CONTRAST  Result Date: 11/06/2020 CLINICAL DATA:  Anal or rectal abscess. Pt. Admitted for blood sugar problems. Pt. Has known rectal abscess. EXAM: CT PELVIS WITH CONTRAST TECHNIQUE: Multidetector CT imaging of the pelvis was performed using the standard protocol following the bolus administration of intravenous contrast. CONTRAST:  49mL OMNIPAQUE IOHEXOL 300 MG/ML  SOLN COMPARISON:  None. FINDINGS: Urinary Tract:  No  abnormality visualized. Bowel: Unremarkable visualized pelvic bowel loops. Ureteral jet noted within urinary bladder on the right. Vascular/Lymphatic: Atherosclerotic plaque. No pathologically enlarged lymph nodes. No significant vascular abnormality seen. Reproductive: Prominent prostate. Otherwise no mass or other significant abnormality Other: No intraperitoneal free fluid. No intraperitoneal free gas. No organized fluid collection. Musculoskeletal: Medial right gluteal subcutaneus soft tissue edema and several foci of gas with no definite organized fluid collection. Edema noted to extend from the right perirectal region. No CT findings of perineal or scrotal involvement. No suspicious bone lesions identified. IMPRESSION: 1. Right perirectal region edema extending to the medial right gluteal subcutaneus soft tissues with several associated foci of gas. No definite organized fluid collection. Please note necrotizing fasciitis cannot be excluded as this is a clinical diagnosis. 2. Prominent prostate. 3.  Aortic Atherosclerosis (ICD10-I70.0). Electronically Signed   By: Iven Finn M.D.   On: 11/06/2020 23:35   MR ABDOMEN MRCP WO CONTRAST  Result Date: 11/08/2020 CLINICAL DATA:  Cholelithiasis, evaluate for choledocholithiasis EXAM: MRI ABDOMEN WITHOUT CONTRAST  (INCLUDING MRCP) TECHNIQUE: Multiplanar multisequence MR imaging of the abdomen was performed. Heavily T2-weighted images of the biliary and pancreatic ducts were obtained, and three-dimensional MRCP images were rendered by post processing. COMPARISON:  None. FINDINGS: Lower chest: No acute findings. Hepatobiliary: No mass or other parenchymal abnormality identified. Intra and extrahepatic biliary ductal dilatation, with relatively abrupt truncation of the central common bile duct just proximal to the ampulla. The central common bile duct measures up to 1.1 cm  in caliber. Distended gallbladder. Pancreas: There is diffuse pancreatic ductal dilatation  in the superior pancreatic head and distally, measuring up to 6 mm in caliber. There is a somewhat ill-defined, T1 and T2 isointense mass of the central pancreatic head and uncinate measuring approximately 2.3 x 2.0 cm (series 4, image 23, series 5, image 15). Spleen:  Within normal limits in size and appearance. Adrenals/Urinary Tract: No masses identified. No evidence of hydronephrosis. Stomach/Bowel: Visualized portions within the abdomen are unremarkable. Vascular/Lymphatic: No pathologically enlarged lymph nodes identified. No abdominal aortic aneurysm demonstrated. Other:  None. Musculoskeletal: No suspicious bone lesions identified. IMPRESSION: 1. There is a somewhat ill-defined, T1 and T2 isointense mass of the central pancreatic head and uncinate measuring approximately 2.3 x 2.0 cm. 2. Intra and extrahepatic biliary ductal dilatation as well as a distended gallbladder, with relatively abrupt truncation of the central common bile duct just proximal to the ampulla. 3. There is also diffuse pancreatic ductal dilatation in the superior pancreatic head and distally, with abrupt truncation in the pancreatic head. 4. Constellation of findings is highly suspicious for pancreatic adenocarcinoma. There is no obvious evidence of abdominal metastatic disease on noncontrast MR. Electronically Signed   By: Eddie Candle M.D.   On: 11/08/2020 11:35   MR 3D Recon At Scanner  Result Date: 11/08/2020 CLINICAL DATA:  Cholelithiasis, evaluate for choledocholithiasis EXAM: MRI ABDOMEN WITHOUT CONTRAST  (INCLUDING MRCP) TECHNIQUE: Multiplanar multisequence MR imaging of the abdomen was performed. Heavily T2-weighted images of the biliary and pancreatic ducts were obtained, and three-dimensional MRCP images were rendered by post processing. COMPARISON:  None. FINDINGS: Lower chest: No acute findings. Hepatobiliary: No mass or other parenchymal abnormality identified. Intra and extrahepatic biliary ductal dilatation, with  relatively abrupt truncation of the central common bile duct just proximal to the ampulla. The central common bile duct measures up to 1.1 cm in caliber. Distended gallbladder. Pancreas: There is diffuse pancreatic ductal dilatation in the superior pancreatic head and distally, measuring up to 6 mm in caliber. There is a somewhat ill-defined, T1 and T2 isointense mass of the central pancreatic head and uncinate measuring approximately 2.3 x 2.0 cm (series 4, image 23, series 5, image 15). Spleen:  Within normal limits in size and appearance. Adrenals/Urinary Tract: No masses identified. No evidence of hydronephrosis. Stomach/Bowel: Visualized portions within the abdomen are unremarkable. Vascular/Lymphatic: No pathologically enlarged lymph nodes identified. No abdominal aortic aneurysm demonstrated. Other:  None. Musculoskeletal: No suspicious bone lesions identified. IMPRESSION: 1. There is a somewhat ill-defined, T1 and T2 isointense mass of the central pancreatic head and uncinate measuring approximately 2.3 x 2.0 cm. 2. Intra and extrahepatic biliary ductal dilatation as well as a distended gallbladder, with relatively abrupt truncation of the central common bile duct just proximal to the ampulla. 3. There is also diffuse pancreatic ductal dilatation in the superior pancreatic head and distally, with abrupt truncation in the pancreatic head. 4. Constellation of findings is highly suspicious for pancreatic adenocarcinoma. There is no obvious evidence of abdominal metastatic disease on noncontrast MR. Electronically Signed   By: Eddie Candle M.D.   On: 11/08/2020 11:35   NM Hepato W/EF  Result Date: 11/08/2020 CLINICAL DATA:  RIGHT upper quadrant pain, thickened gallbladder wall with sludge question cholecystitis EXAM: NUCLEAR MEDICINE HEPATOBILIARY IMAGING TECHNIQUE: Sequential images of the abdomen were obtained out to 60 minutes following intravenous administration of radiopharmaceutical.  RADIOPHARMACEUTICALS:  4.8 mCi Tc-57m  Choletec IV COMPARISON:  Ultrasound abdomen 11/07/2020, MRI/MRCP 11/08/2020 FINDINGS: Mildly delayed clearance of  tracer from blood pool indicating component of hepatocellular dysfunction. Delayed excretion of tracer into biliary tree. At 1 hour, all of the tracer remains within the liver parenchyma. At 2 hours, all tracer remains within liver parenchyma, without visualization of the biliary tree. Delayed image at 4 hours reveals tracer within the small bowel indicating patency of the common bile duct. Additionally, tracer is not visualized within the gallbladder consistent with patent cystic duct. IMPRESSION: Hepatocellular dysfunction. Delayed visualization of tracer within the gallbladder, indicating presence of a patent cystic duct and an absence of acute cholecystitis; however, delayed visualization of the gallbladder can be seen in patients with chronic cholecystitis. Electronically Signed   By: Lavonia Dana M.D.   On: 11/08/2020 13:12   US Abdomen Limited RUQ (LIVER/GB)  Result Date: 11/07/2020 CLINICAL DATA:  Elevated LFTs. EXAM: ULTRASOUND ABDOMEN LIMITED RIGHT UPPER QUADRANT COMPARISON:  None. FINDINGS: Gallbladder: Gallbladder is distended with layering sludge. Gallbladder wall thickness upper normal to mildly increased at 3-4 mm. No appreciable pericholecystic fluid. The sonographer reports no sonographic Murphy sign. Common bile duct: Diameter: Dilated up to 7-10 mm diameter. Liver: Increased echogenicity suggests fatty deposition. Mild intrahepatic biliary duct dilatation evident. Portal vein is patent on color Doppler imaging with normal direction of blood flow towards the liver. Other: None. IMPRESSION: 1. Distended gallbladder with borderline to mild gallbladder wall thickening. No gallstones evident by ultrasound. If there is clinical concern for cystic duct obstruction, nuclear scintigraphy may prove helpful to further evaluate. Given the biliary  dilatation, MRI/MRCP may also prove helpful. 2. Probable hepatic steatosis. Electronically Signed   By: Misty Stanley M.D.   On: 11/07/2020 15:12    Anti-infectives: Anti-infectives (From admission, onward)    Start     Dose/Rate Route Frequency Ordered Stop   11/07/20 1230  cefTRIAXone (ROCEPHIN) 2 g in sodium chloride 0.9 % 100 mL IVPB        2 g 200 mL/hr over 30 Minutes Intravenous Every 24 hours 11/07/20 1150     11/07/20 1000  vancomycin (VANCOCIN) IVPB 1000 mg/200 mL premix        1,000 mg 200 mL/hr over 60 Minutes Intravenous Every 12 hours 11/07/20 0531     11/07/20 0500  vancomycin (VANCOCIN) IVPB 1000 mg/200 mL premix  Status:  Discontinued        1,000 mg 200 mL/hr over 60 Minutes Intravenous  Once 11/07/20 0415 11/07/20 0416   11/07/20 0000  vancomycin (VANCOREADY) IVPB 1750 mg/350 mL        1,750 mg 175 mL/hr over 120 Minutes Intravenous  Once 11/06/20 2349 11/07/20 0315   11/07/20 0000  piperacillin-tazobactam (ZOSYN) IVPB 3.375 g        3.375 g 100 mL/hr over 30 Minutes Intravenous  Once 11/06/20 2349 11/07/20 0110   11/07/20 0000  clindamycin (CLEOCIN) IVPB 900 mg        900 mg 100 mL/hr over 30 Minutes Intravenous  Once 11/06/20 2349 11/07/20 0113   11/06/20 2315  ciprofloxacin (CIPRO) IVPB 400 mg  Status:  Discontinued        400 mg 200 mL/hr over 60 Minutes Intravenous  Once 11/06/20 2301 11/06/20 2347   11/06/20 2315  metroNIDAZOLE (FLAGYL) IVPB 500 mg  Status:  Discontinued        500 mg 100 mL/hr over 60 Minutes Intravenous  Once 11/06/20 2301 11/06/20 2347       Assessment/Plan: Mr. Beeks is a 65 yo with perianal abscess that is spontaneous draining and does  not need I&D.  He has been worked up for abnormal LFTs and has findings concerning for pancreatic cancer and possibly chronic cholecystitis.  Pancreatic cancer- would get GI involved, will need ERCP/ EUS for diagnosis; referral to Surg Onc  Chronic cholecystitis - not symptomatic, would not proceed  with any intervention or treatment at this time Perianal abscess continue antibiotic, sitz baths to complete a course, no indication for drainage needs at this time as is draining spontaneously and improving    Discussed with Dr. Wynetta Emery. He will update patient on findings of MRCP.    LOS: 1 day    Virl Cagey 11/08/2020

## 2020-11-08 NOTE — Progress Notes (Signed)
PROGRESS NOTE   Alexander Banks  ZHG:[id number] DOB: 11-03-1955 DOA: 11/06/2020 PCP: [name], MD   Chief Complaint  Patient presents with   Hyperglycemia   Level of care: Med-Surg  Brief Admission History:  65 y.o. male with medical history significant for T2DM, hypertension, hyperlipidemia, hypothyroidism who presents to the emergency department for evaluation of elevated blood glucose level.  Patient complained of 3-week onset of increased thirst and urination and weight loss.  He also complained of 1 week onset of possible abscess in his buttock which was popped by wife at home with relief, he complained of subjective fever and purulent drainage for about 4 to 5 days and was seen at an urgent care on 11/03/2020 where patient was prescribed with Augmentin and Flagyl and advised on conservative treatment.  He was seen in the ED on 6/18 where patient was suspected to have early onset of DKA, acute kidney injury and abscess of buttock.  IV hydration was provided, insulin was given and patient was advised to follow-up with his PCP.  He followed with his PCP yesterday (6/20), lab work done showed elevated beta hydroxybutyrate level as well as increased anion gap with creatinine being above baseline level, his PCP consulted with an endocrinologist who recommends that patient should go to an ED since patient was still in DKA.  He denies nausea, vomiting, chest pain, shortness of breath   Assessment & Plan:   Principal Problem:   DKA (diabetic ketoacidosis) ([place]) Active Problems:   Hyperlipidemia   Essential hypertension, benign   Hypothyroidism   Hyperglycemia due to diabetes mellitus ([place])   Perirectal abscess   Hyponatremia   Macrocytic anemia   AKI (acute kidney injury) ([place])   Dehydration   Cellulitis   Pancreatic mass   Chronic cholecystitis   DKA - resolved now.  He was treated with IV insulin with good results.    Uncontrolled type 2 diabetes mellitus - remains  uncontrolled, increased prandial and basal coverage with increased CBG testing.  A1c was 12.8% which indicates very poorly controlled disease.    Cellulitis / Gluteal abscess - discussed with Dr. Constance Haw, improving with spontaneous drainage, continue antibiotics and wound care as ordered.  Continue sitz baths.   AKI - prerenal - resolved with IV fluid hydration.   Hyperlipidemia - continue statin therapy.    Question of chronic cholecystitis - MRCP concerning for pancreatic cancer, discussed with Dr. Constance Haw, recommended GI consultation.  Requested consultation on 6/22 and discussed results with patient and family member at bedside.   Hypothyroidism - resumed home levothyroxine.    DVT prophylaxis: enoxaparin  Code Status: full  Family Communication: updated at bedside  Disposition: home  Status is: Inpatient  Remains inpatient appropriate because:Inpatient level of care appropriate due to severity of illness  Dispo: The patient is from: Home              Anticipated d/c is to: Home              Patient currently is not medically stable to d/c.   Difficult to place patient No  Consultants:  Surgery GI   Procedures:    Antimicrobials:  N/a   Subjective: Pt is reporting that he is feeling well.  No abdominal pain.  He is asking about going home.   Objective: Vitals:   11/07/20 1600 11/07/20 1700 11/07/20 2054 11/08/20 0536  BP: (!) 148/82  (!) 155/85 (!) 144/87  Pulse: 77  79 89  Resp: Marland Kitchen)  8  (!) 22 16  Temp:  98.2 F (36.8 C) 98.3 F (36.8 C) 98.2 F (36.8 C)  TempSrc:   Oral Oral  SpO2: 97%  99% 97%  Weight:      Height:        Intake/Output Summary (Last 24 hours) at 11/08/2020 1402 Last data filed at 11/07/2020 1800 Gross per 24 hour  Intake 436.54 ml  Output 625 ml  Net -188.46 ml   Filed Weights   11/06/20 1828 11/07/20 0151  Weight: 90.3 kg 92.9 kg    Examination:  General exam: chronically ill appearing male, awake, alert, cooperative, Appears  calm and comfortable  Respiratory system: Clear to auscultation. Respiratory effort normal. Cardiovascular system: normal S1 & S2 heard. No JVD, murmurs, rubs, gallops or clicks. No pedal edema. Gastrointestinal system: Abdomen is nondistended, soft and nontender. No organomegaly or masses felt. Normal bowel sounds heard. Central nervous system: Alert and oriented. No focal neurological deficits. Extremities: Symmetric 5 x 5 power. Skin: No rashes, lesions or ulcers Psychiatry: Judgement and insight appear normal. Mood & affect appropriate.   Data Reviewed: I have personally reviewed following labs and imaging studies  CBC: Recent Labs  Lab 11/04/20 1223 11/06/20 1343 11/06/20 2033 11/07/20 0637 11/08/20 0456  WBC 8.4 6.8 5.4 5.6 5.0  NEUTROABS 6.6 4.9 3.8  --   --   HGB 12.6* 12.6* 12.2* 11.5* 11.7*  HCT 37.6* 38.6* 36.2* 34.3* 34.4*  MCV 98.9 101.0* 99.7 99.1 98.6  PLT 385 410* 378 396 528    Basic Metabolic Panel: Recent Labs  Lab 11/07/20 0329 11/07/20 0637 11/07/20 0702 11/07/20 1027 11/08/20 0456  NA 134* 135 136 135 136  K 3.6 3.5 3.5 3.6 3.5  CL 100 105 105 105 106  CO2 22 23 23 24 22   GLUCOSE 148* 156* 151* 171* 268*  BUN 21 20 20 18 14   CREATININE 1.38* 1.42* 1.44* 1.45* 1.42*  CALCIUM 9.0 8.8* 8.7* 8.6* 8.7*  MG  --  1.8  --   --  1.7  PHOS  --  2.4*  --   --   --     GFR: Estimated Creatinine Clearance: 63.6 mL/min (A) (by C-G formula based on SCr of 1.42 mg/dL (H)).  Liver Function Tests: Recent Labs  Lab 11/04/20 1223 11/07/20 0637 11/08/20 0456  AST 22 434* 111*  ALT 58* 281* 198*  ALKPHOS 279* 596* 508*  BILITOT 1.1 1.7* 1.1  PROT 7.6 6.1* 6.1*  ALBUMIN 3.3* 2.8* 2.7*    CBG: Recent Labs  Lab 11/07/20 1313 11/07/20 1644 11/07/20 2126 11/08/20 0707 11/08/20 1106  GLUCAP 155* 301* 292* 277* 253*    Recent Results (from the past 240 hour(s))  Resp Panel by RT-PCR (Flu A&B, Covid) Nasopharyngeal Swab     Status: None    Collection Time: 11/06/20 10:55 PM   Specimen: Nasopharyngeal Swab; Nasopharyngeal(NP) swabs in vial transport medium  Result Value Ref Range Status   SARS Coronavirus 2 by RT PCR NEGATIVE NEGATIVE Final    Comment: (NOTE) SARS-CoV-2 target nucleic acids are NOT DETECTED.  The SARS-CoV-2 RNA is generally detectable in upper respiratory specimens during the acute phase of infection. The lowest concentration of SARS-CoV-2 viral copies this assay can detect is 138 copies/mL. A negative result does not preclude SARS-Cov-2 infection and should not be used as the sole basis for treatment or other patient management decisions. A negative result may occur with  improper specimen collection/handling, submission of specimen other than nasopharyngeal  swab, presence of viral mutation(s) within the areas targeted by this assay, and inadequate number of viral copies(<138 copies/mL). A negative result must be combined with clinical observations, patient history, and epidemiological information. The expected result is Negative.  Fact Sheet for Patients:  EntrepreneurPulse.com.au  Fact Sheet for Healthcare Providers:  IncredibleEmployment.be  This test is no t yet approved or cleared by the [place] FDA and  has been authorized for detection and/or diagnosis of SARS-CoV-2 by FDA under an Emergency Use Authorization (EUA). This EUA will remain  in effect (meaning this test can be used) for the duration of the COVID-19 declaration under Section 564(b)(1) of the Act, 21 U.S.C.section 360bbb-3(b)(1), unless the authorization is terminated  or revoked sooner.       Influenza A by PCR NEGATIVE NEGATIVE Final   Influenza B by PCR NEGATIVE NEGATIVE Final    Comment: (NOTE) The Xpert Xpress SARS-CoV-2/FLU/RSV plus assay is intended as an aid in the diagnosis of influenza from Nasopharyngeal swab specimens and should not be used as a sole basis for treatment.  Nasal washings and aspirates are unacceptable for Xpert Xpress SARS-CoV-2/FLU/RSV testing.  Fact Sheet for Patients: EntrepreneurPulse.com.au  Fact Sheet for Healthcare Providers: IncredibleEmployment.be  This test is not yet approved or cleared by the [place] FDA and has been authorized for detection and/or diagnosis of SARS-CoV-2 by FDA under an Emergency Use Authorization (EUA). This EUA will remain in effect (meaning this test can be used) for the duration of the COVID-19 declaration under Section 564(b)(1) of the Act, 21 U.S.C. section 360bbb-3(b)(1), unless the authorization is terminated or revoked.  Performed at [facility], [address] [place], [place] [address]   MRSA Next Gen by PCR, Nasal     Status: None   Collection Time: 11/07/20  1:41 AM   Specimen: Nasal Mucosa; Nasal Swab  Result Value Ref Range Status   MRSA by PCR Next Gen NOT DETECTED NOT DETECTED Final    Comment: (NOTE) The GeneXpert MRSA Assay (FDA approved for NASAL specimens only), is one component of a comprehensive MRSA colonization surveillance program. It is not intended to diagnose MRSA infection nor to guide or monitor treatment for MRSA infections. Test performance is not FDA approved in patients less than 32 years old. Performed at [facility], [address], [place], [place] [address]      Radiology Studies: CT PELVIS W CONTRAST  Result Date: 11/06/2020 CLINICAL DATA:  Anal or rectal abscess. Pt. Admitted for blood sugar problems. Pt. Has known rectal abscess. EXAM: CT PELVIS WITH CONTRAST TECHNIQUE: Multidetector CT imaging of the pelvis was performed using the standard protocol following the bolus administration of intravenous contrast. CONTRAST:  17mL OMNIPAQUE IOHEXOL 300 MG/ML  SOLN COMPARISON:  None. FINDINGS: Urinary Tract:  No abnormality visualized. Bowel: Unremarkable visualized pelvic bowel loops. Ureteral jet noted within urinary bladder  on the right. Vascular/Lymphatic: Atherosclerotic plaque. No pathologically enlarged lymph nodes. No significant vascular abnormality seen. Reproductive: Prominent prostate. Otherwise no mass or other significant abnormality Other: No intraperitoneal free fluid. No intraperitoneal free gas. No organized fluid collection. Musculoskeletal: Medial right gluteal subcutaneus soft tissue edema and several foci of gas with no definite organized fluid collection. Edema noted to extend from the right perirectal region. No CT findings of perineal or scrotal involvement. No suspicious bone lesions identified. IMPRESSION: 1. Right perirectal region edema extending to the medial right gluteal subcutaneus soft tissues with several associated foci of gas. No definite organized fluid collection. Please note necrotizing  fasciitis cannot be excluded as this is a clinical diagnosis. 2. Prominent prostate. 3.  Aortic Atherosclerosis (ICD10-I70.0). Electronically Signed   By: Iven Finn M.D.   On: 11/06/2020 23:35   MR ABDOMEN MRCP WO CONTRAST  Result Date: 11/08/2020 CLINICAL DATA:  Cholelithiasis, evaluate for choledocholithiasis EXAM: MRI ABDOMEN WITHOUT CONTRAST  (INCLUDING MRCP) TECHNIQUE: Multiplanar multisequence MR imaging of the abdomen was performed. Heavily T2-weighted images of the biliary and pancreatic ducts were obtained, and three-dimensional MRCP images were rendered by post processing. COMPARISON:  None. FINDINGS: Lower chest: No acute findings. Hepatobiliary: No mass or other parenchymal abnormality identified. Intra and extrahepatic biliary ductal dilatation, with relatively abrupt truncation of the central common bile duct just proximal to the ampulla. The central common bile duct measures up to 1.1 cm in caliber. Distended gallbladder. Pancreas: There is diffuse pancreatic ductal dilatation in the superior pancreatic head and distally, measuring up to 6 mm in caliber. There is a somewhat ill-defined, T1  and T2 isointense mass of the central pancreatic head and uncinate measuring approximately 2.3 x 2.0 cm (series 4, image 23, series 5, image 15). Spleen:  Within normal limits in size and appearance. Adrenals/Urinary Tract: No masses identified. No evidence of hydronephrosis. Stomach/Bowel: Visualized portions within the abdomen are unremarkable. Vascular/Lymphatic: No pathologically enlarged lymph nodes identified. No abdominal aortic aneurysm demonstrated. Other:  None. Musculoskeletal: No suspicious bone lesions identified. IMPRESSION: 1. There is a somewhat ill-defined, T1 and T2 isointense mass of the central pancreatic head and uncinate measuring approximately 2.3 x 2.0 cm. 2. Intra and extrahepatic biliary ductal dilatation as well as a distended gallbladder, with relatively abrupt truncation of the central common bile duct just proximal to the ampulla. 3. There is also diffuse pancreatic ductal dilatation in the superior pancreatic head and distally, with abrupt truncation in the pancreatic head. 4. Constellation of findings is highly suspicious for pancreatic adenocarcinoma. There is no obvious evidence of abdominal metastatic disease on noncontrast MR. Electronically Signed   By: Eddie Candle M.D.   On: 11/08/2020 11:35   MR 3D Recon At Scanner  Result Date: 11/08/2020 CLINICAL DATA:  Cholelithiasis, evaluate for choledocholithiasis EXAM: MRI ABDOMEN WITHOUT CONTRAST  (INCLUDING MRCP) TECHNIQUE: Multiplanar multisequence MR imaging of the abdomen was performed. Heavily T2-weighted images of the biliary and pancreatic ducts were obtained, and three-dimensional MRCP images were rendered by post processing. COMPARISON:  None. FINDINGS: Lower chest: No acute findings. Hepatobiliary: No mass or other parenchymal abnormality identified. Intra and extrahepatic biliary ductal dilatation, with relatively abrupt truncation of the central common bile duct just proximal to the ampulla. The central common bile duct  measures up to 1.1 cm in caliber. Distended gallbladder. Pancreas: There is diffuse pancreatic ductal dilatation in the superior pancreatic head and distally, measuring up to 6 mm in caliber. There is a somewhat ill-defined, T1 and T2 isointense mass of the central pancreatic head and uncinate measuring approximately 2.3 x 2.0 cm (series 4, image 23, series 5, image 15). Spleen:  Within normal limits in size and appearance. Adrenals/Urinary Tract: No masses identified. No evidence of hydronephrosis. Stomach/Bowel: Visualized portions within the abdomen are unremarkable. Vascular/Lymphatic: No pathologically enlarged lymph nodes identified. No abdominal aortic aneurysm demonstrated. Other:  None. Musculoskeletal: No suspicious bone lesions identified. IMPRESSION: 1. There is a somewhat ill-defined, T1 and T2 isointense mass of the central pancreatic head and uncinate measuring approximately 2.3 x 2.0 cm. 2. Intra and extrahepatic biliary ductal dilatation as well as a distended gallbladder, with relatively abrupt  truncation of the central common bile duct just proximal to the ampulla. 3. There is also diffuse pancreatic ductal dilatation in the superior pancreatic head and distally, with abrupt truncation in the pancreatic head. 4. Constellation of findings is highly suspicious for pancreatic adenocarcinoma. There is no obvious evidence of abdominal metastatic disease on noncontrast MR. Electronically Signed   By: Eddie Candle M.D.   On: 11/08/2020 11:35   NM Hepato W/EF  Result Date: 11/08/2020 CLINICAL DATA:  RIGHT upper quadrant pain, thickened gallbladder wall with sludge question cholecystitis EXAM: NUCLEAR MEDICINE HEPATOBILIARY IMAGING TECHNIQUE: Sequential images of the abdomen were obtained out to 60 minutes following intravenous administration of radiopharmaceutical. RADIOPHARMACEUTICALS:  4.8 mCi Tc-61m  Choletec IV COMPARISON:  Ultrasound abdomen 11/07/2020, MRI/MRCP 11/08/2020 FINDINGS: Mildly  delayed clearance of tracer from blood pool indicating component of hepatocellular dysfunction. Delayed excretion of tracer into biliary tree. At 1 hour, all of the tracer remains within the liver parenchyma. At 2 hours, all tracer remains within liver parenchyma, without visualization of the biliary tree. Delayed image at 4 hours reveals tracer within the small bowel indicating patency of the common bile duct. Additionally, tracer is not visualized within the gallbladder consistent with patent cystic duct. IMPRESSION: Hepatocellular dysfunction. Delayed visualization of tracer within the gallbladder, indicating presence of a patent cystic duct and an absence of acute cholecystitis; however, delayed visualization of the gallbladder can be seen in patients with chronic cholecystitis. Electronically Signed   By: Lavonia Dana M.D.   On: 11/08/2020 13:12   US Abdomen Limited RUQ (LIVER/GB)  Result Date: 11/07/2020 CLINICAL DATA:  Elevated LFTs. EXAM: ULTRASOUND ABDOMEN LIMITED RIGHT UPPER QUADRANT COMPARISON:  None. FINDINGS: Gallbladder: Gallbladder is distended with layering sludge. Gallbladder wall thickness upper normal to mildly increased at 3-4 mm. No appreciable pericholecystic fluid. The sonographer reports no sonographic Murphy sign. Common bile duct: Diameter: Dilated up to 7-10 mm diameter. Liver: Increased echogenicity suggests fatty deposition. Mild intrahepatic biliary duct dilatation evident. Portal vein is patent on color Doppler imaging with normal direction of blood flow towards the liver. Other: None. IMPRESSION: 1. Distended gallbladder with borderline to mild gallbladder wall thickening. No gallstones evident by ultrasound. If there is clinical concern for cystic duct obstruction, nuclear scintigraphy may prove helpful to further evaluate. Given the biliary dilatation, MRI/MRCP may also prove helpful. 2. Probable hepatic steatosis. Electronically Signed   By: Misty Stanley M.D.   On: 11/07/2020  15:12    Scheduled Meds:  Chlorhexidine Gluconate Cloth  6 each Topical Daily   insulin aspart  0-15 Units Subcutaneous TID WC   insulin aspart  0-5 Units Subcutaneous QHS   insulin aspart  8 Units Subcutaneous TID WC   insulin glargine  24 Units Subcutaneous Daily   potassium chloride  40 mEq Oral BID   Continuous Infusions:  cefTRIAXone (ROCEPHIN)  IV Stopped (11/07/20 1238)   lactated ringers 75 mL/hr at 11/08/20 1057   vancomycin 1,000 mg (11/08/20 1056)    LOS: 1 day   Time spent: 40 mins   Clearnce Leja Wynetta Emery, MD How to contact the [facility] Attending or Consulting provider [place] or covering provider during after hours [place], for this patient?  Check the care team in [facility] and look for a) attending/consulting [facility] provider listed and b) the [facility] team listed Log into [email or web address] and use [place]'s universal password to access. If you do not have the password, please contact the hospital operator. Locate the [facility] provider you are looking for  under [facility] and page to a number that you can be directly reached. If you still have difficulty reaching the provider, please page the [facility] (Director on Call) for the Hospitalists listed on amion for assistance.  11/08/2020, 2:02 PM

## 2020-11-09 ENCOUNTER — Encounter (HOSPITAL_COMMUNITY): Payer: Self-pay | Admitting: Internal Medicine

## 2020-11-09 ENCOUNTER — Inpatient Hospital Stay (HOSPITAL_COMMUNITY): Payer: BC Managed Care – PPO | Admitting: Anesthesiology

## 2020-11-09 ENCOUNTER — Inpatient Hospital Stay (HOSPITAL_COMMUNITY): Payer: BC Managed Care – PPO

## 2020-11-09 ENCOUNTER — Encounter (HOSPITAL_COMMUNITY)
Admission: EM | Disposition: A | Discharge status: Home / Self Care | Payer: Self-pay | Source: Home / Self Care | Attending: Family Medicine

## 2020-11-09 DIAGNOSIS — K831 Obstruction of bile duct: Secondary | ICD-10-CM

## 2020-11-09 DIAGNOSIS — K8689 Other specified diseases of pancreas: Secondary | ICD-10-CM

## 2020-11-09 HISTORY — PX: SPHINCTEROTOMY: SHX5279

## 2020-11-09 HISTORY — PX: ERCP: SHX5425

## 2020-11-09 HISTORY — PX: BILIARY STENT PLACEMENT: SHX5538

## 2020-11-09 LAB — GLUCOSE, CAPILLARY
Glucose-Capillary: 276 mg/dL — ABNORMAL HIGH (ref 70–99)
Glucose-Capillary: 328 mg/dL — ABNORMAL HIGH (ref 70–99)
Glucose-Capillary: 254 mg/dL — ABNORMAL HIGH (ref 70–99)
Glucose-Capillary: 292 mg/dL — ABNORMAL HIGH (ref 70–99)
Glucose-Capillary: 267 mg/dL — ABNORMAL HIGH (ref 70–99)

## 2020-11-09 LAB — HEPATIC FUNCTION PANEL
ALT: 166 U/L — ABNORMAL HIGH (ref 0–44)
AST: 79 U/L — ABNORMAL HIGH (ref 15–41)
Albumin: 2.7 g/dL — ABNORMAL LOW (ref 3.5–5.0)
Alkaline Phosphatase: 543 U/L — ABNORMAL HIGH (ref 38–126)
Bilirubin, Direct: 0.4 mg/dL — ABNORMAL HIGH (ref 0.0–0.2)
Indirect Bilirubin: 0.7 mg/dL (ref 0.3–0.9)
Total Bilirubin: 1.1 mg/dL (ref 0.3–1.2)
Total Protein: 6.1 g/dL — ABNORMAL LOW (ref 6.5–8.1)

## 2020-11-09 IMAGING — XA DG ERCP WO/W SPHINCTEROTOMY
1 series · 10 of 10 positions shown · non-contrast
Comparison: MR [DATE], ultrasound abdomen [DATE]

CLINICAL DATA: Distal CBD obstruction, pancreatic mass by MR

EXAM:
ERCP WITH SPHINCTEROTOMY AND STENTING
TECHNIQUE: Multiple spot images obtained with the fluoroscopic device and
submitted for interpretation post-procedure.
FLUOROSCOPY TIME:  Fluoroscopy Time: 0 minutes 43 seconds
Radiation Exposure Index (if provided by the fluoroscopic device):
17.07 mGy
Number of Acquired Spot Images: multiple fluoroscopic screen
captures

[Series 1: unknown protocol · 0.20mm/px · 4 acquisitions, 10 frames shown]
[im 1/4]
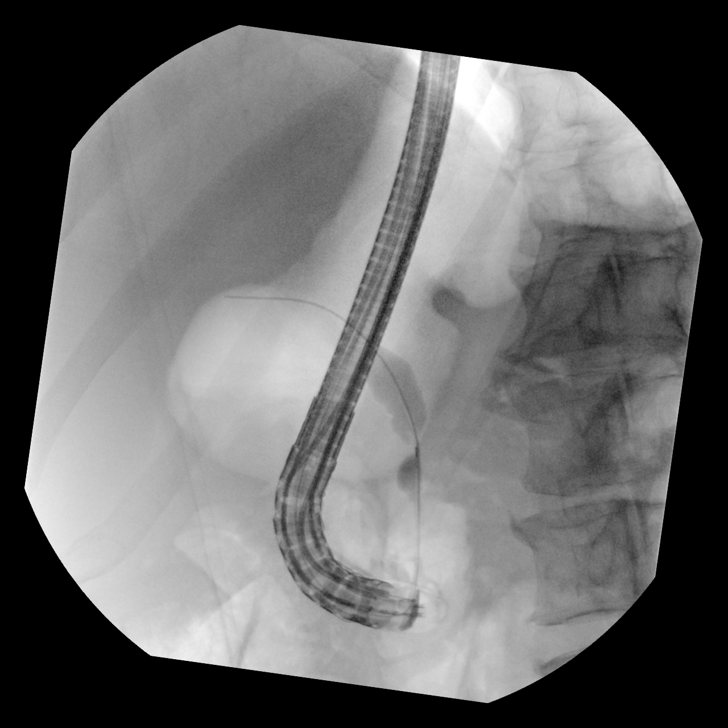
[im 1/4]
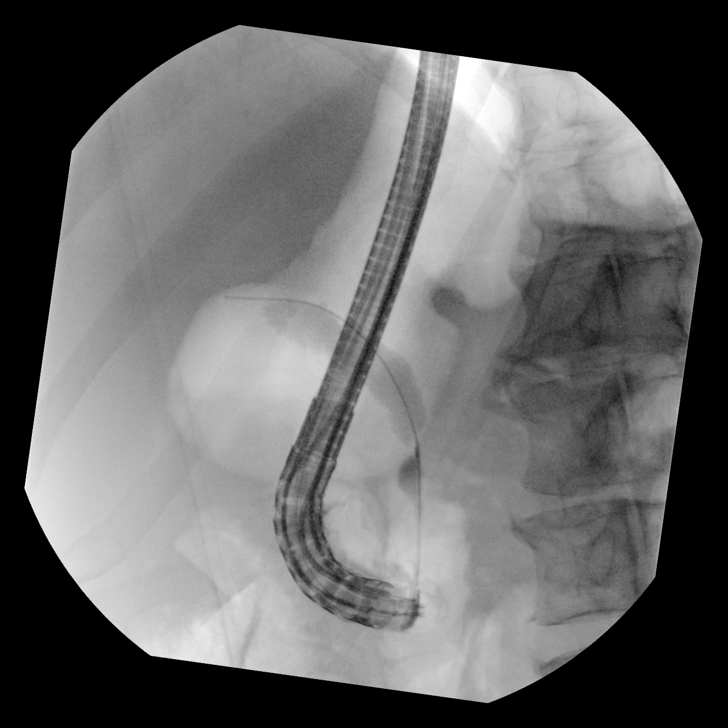
[im 1/4]
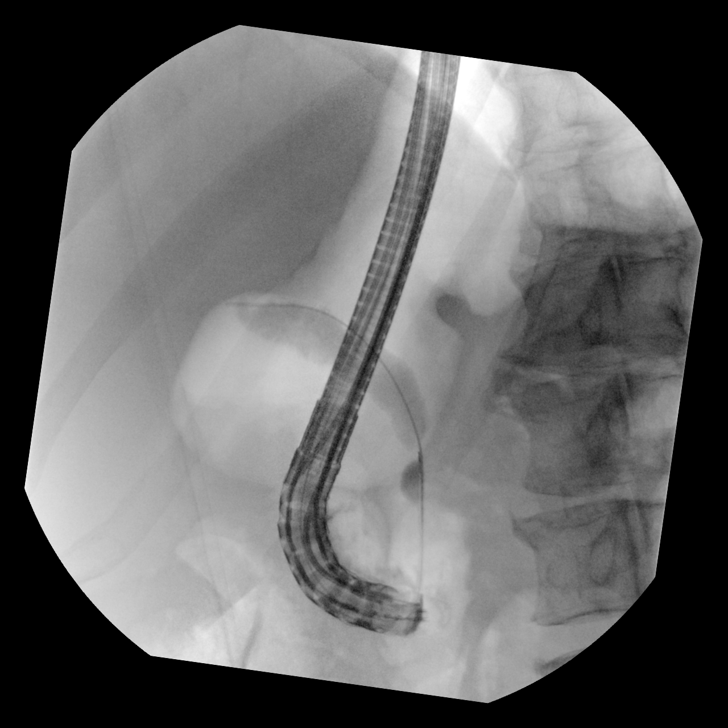
[im 1/4]
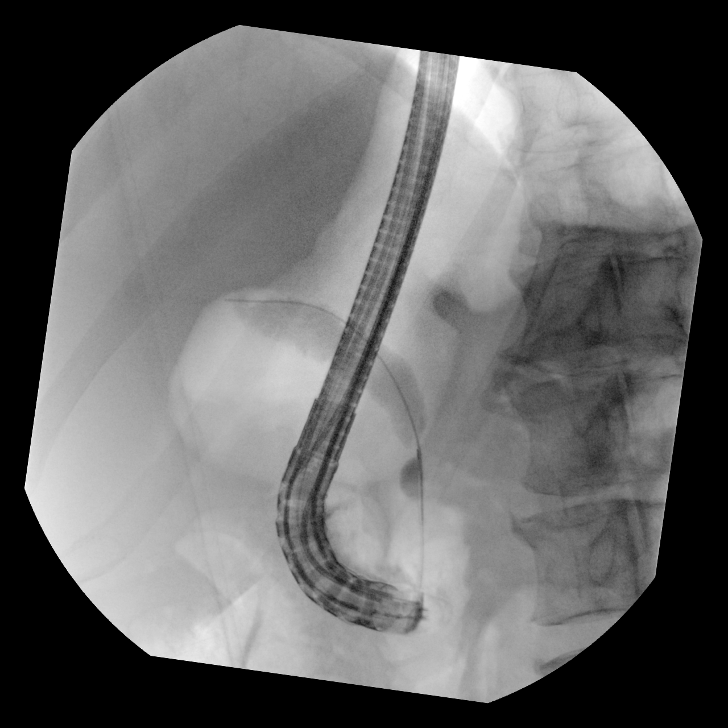
[im 2/4]
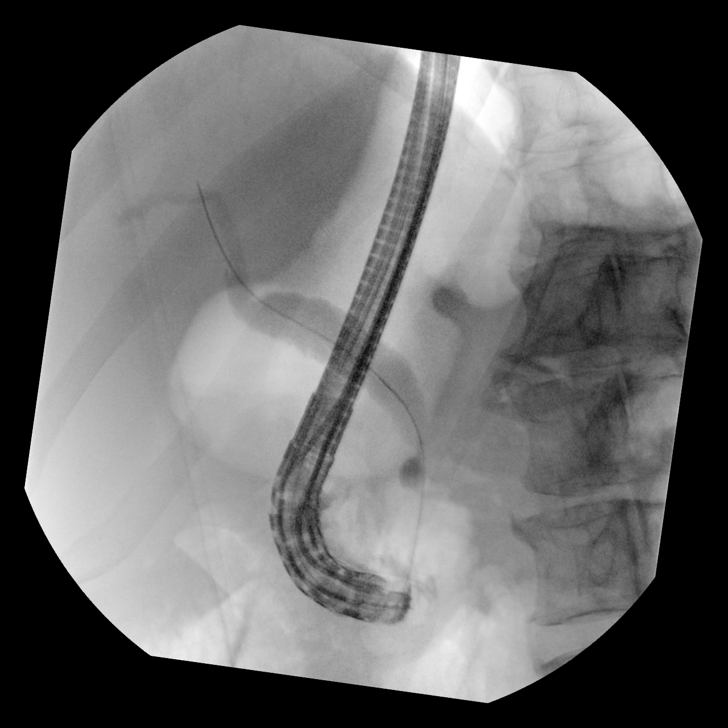
[im 2/4]
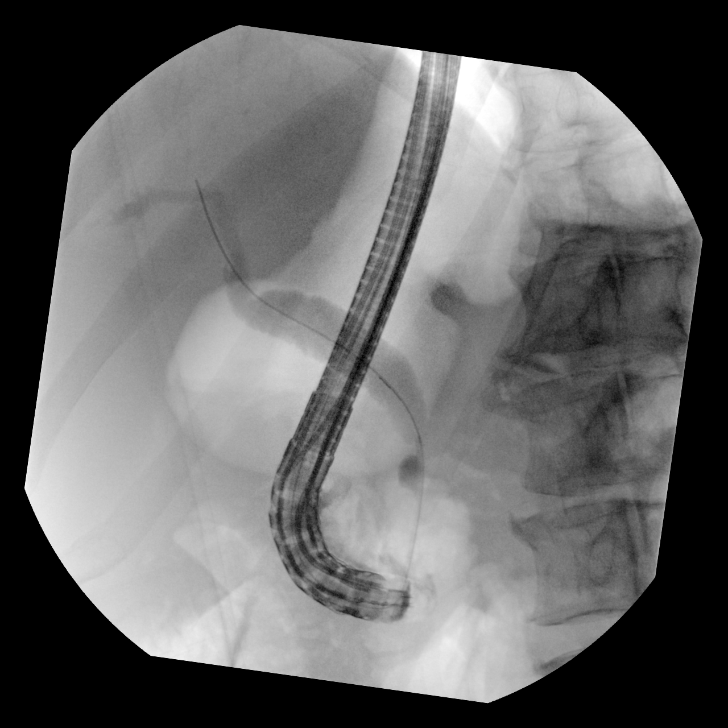
[im 2/4]
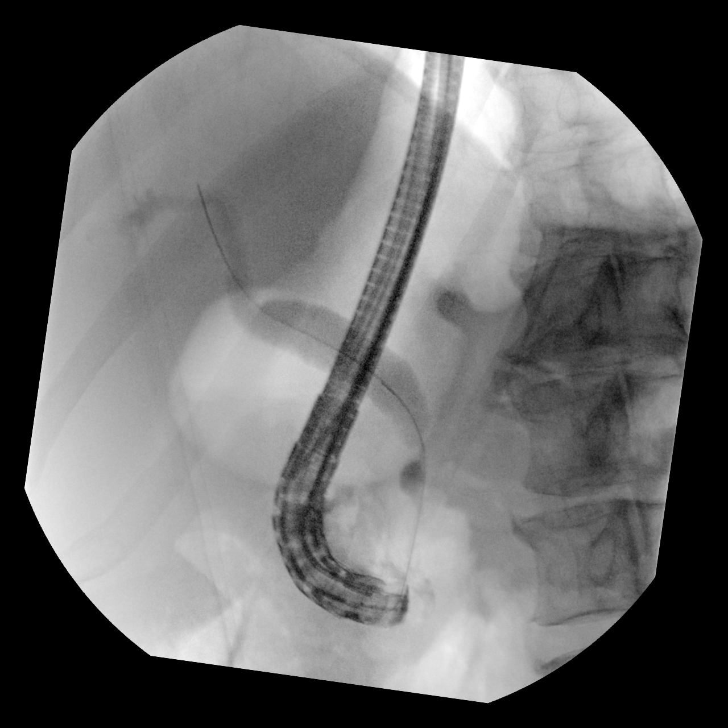
[im 2/4]
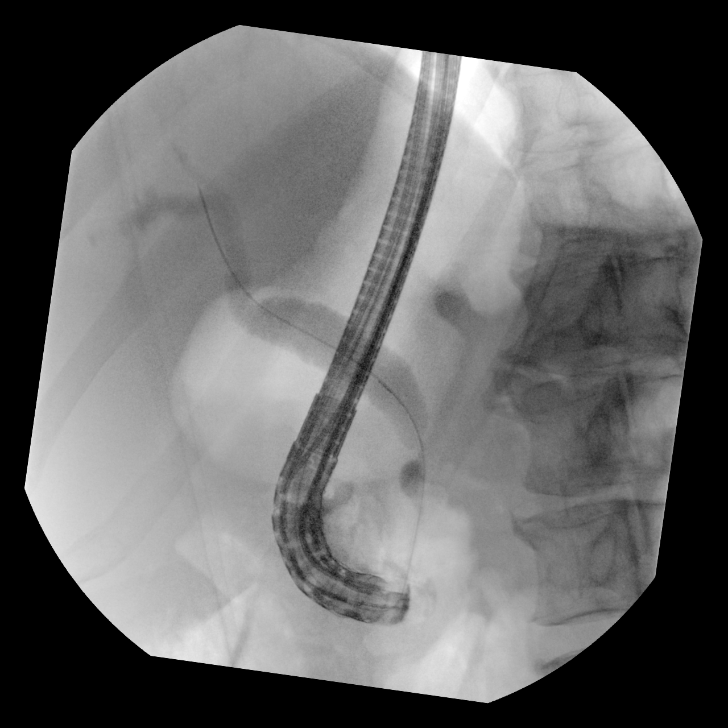
[im 3/4]
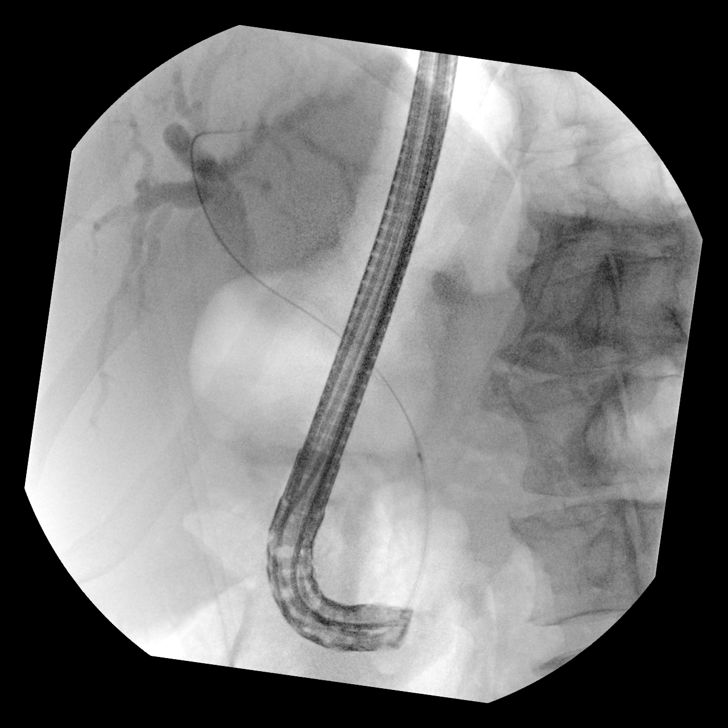
[im 4/4]
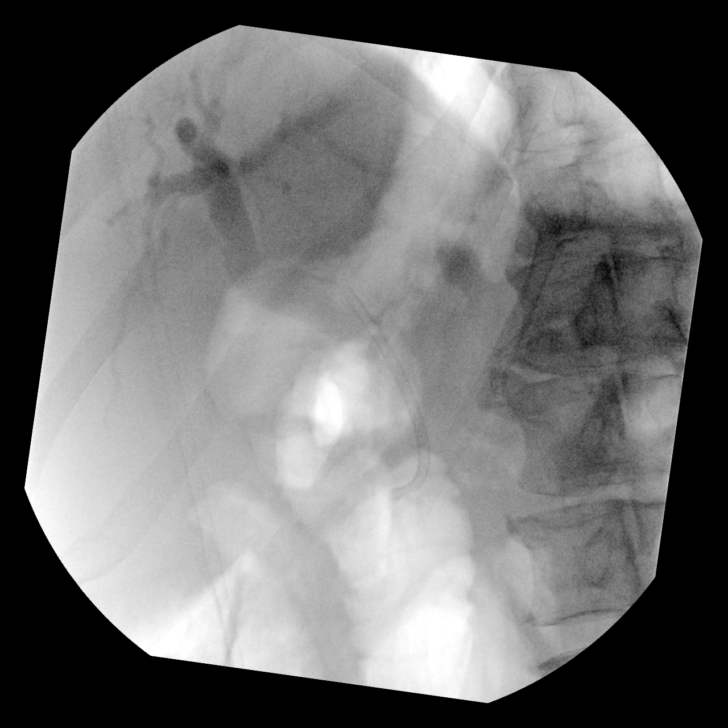

[10 of 10 positions shown; findings below may reference images not displayed]

FINDINGS: Images demonstrate presence of a short high-grade distal CBD
stricture with abrupt tapering of CBD diameter similar to that seen
on MR.

No filling defects identified within the dilated CBD, which measures
up to 14 mm diameter.

Good drainage of contrast following stent placement.
IMPRESSION: Short high-grade distal CBD stricture with dilated CBD up to 14 mm
diameter.

Good drainage of contrast following stent placement.

These images were submitted for radiologic interpretation only.
Please see the procedural report for the amount of contrast and the
fluoroscopy time utilized.

## 2020-11-09 SURGERY — ERCP, WITH INTERVENTION IF INDICATED
Anesthesia: General

## 2020-11-09 MED ORDER — SODIUM CHLORIDE 0.9 % IV SOLN
INTRAVENOUS | Status: DC | PRN
  Administered 2020-11-09: 12 mL

## 2020-11-09 MED ORDER — SODIUM CHLORIDE 0.9 % IV SOLN: INTRAVENOUS | Status: DC

## 2020-11-09 MED ORDER — FENTANYL CITRATE (PF) 100 MCG/2ML IJ SOLN
INTRAMUSCULAR | Status: DC | PRN
  Administered 2020-11-09: 50 ug via INTRAVENOUS
  Administered 2020-11-09: 25 ug via INTRAVENOUS
  Administered 2020-11-09: 100 ug via INTRAVENOUS
  Administered 2020-11-09: 25 ug via INTRAVENOUS

## 2020-11-09 MED ORDER — ONDANSETRON HCL 4 MG/2ML IJ SOLN
INTRAMUSCULAR | Status: AC
  Filled 2020-11-09: qty 2

## 2020-11-09 MED ORDER — STERILE WATER FOR IRRIGATION IR SOLN
Status: DC | PRN
  Administered 2020-11-09: 1000 mL

## 2020-11-09 MED ORDER — MIDAZOLAM HCL 5 MG/5ML IJ SOLN
INTRAMUSCULAR | Status: DC | PRN
  Administered 2020-11-09: 2 mg via INTRAVENOUS

## 2020-11-09 MED ORDER — INSULIN ASPART 100 UNIT/ML IJ SOLN
10.0000 [IU] | Freq: Three times a day (TID) | INTRAMUSCULAR | Status: DC
  Administered 2020-11-09 – 2020-11-10 (×3): 10 [IU] via SUBCUTANEOUS

## 2020-11-09 MED ORDER — FENTANYL CITRATE (PF) 250 MCG/5ML IJ SOLN
INTRAMUSCULAR | Status: AC
  Filled 2020-11-09: qty 5

## 2020-11-09 MED ORDER — MIDAZOLAM HCL 2 MG/2ML IJ SOLN
INTRAMUSCULAR | Status: AC
  Filled 2020-11-09: qty 2

## 2020-11-09 MED ORDER — ONDANSETRON HCL 4 MG/2ML IJ SOLN: 4.0000 mg | Freq: Once | INTRAMUSCULAR | Status: DC | PRN

## 2020-11-09 MED ORDER — SUCCINYLCHOLINE CHLORIDE 200 MG/10ML IV SOSY
PREFILLED_SYRINGE | INTRAVENOUS | Status: AC
  Filled 2020-11-09: qty 10

## 2020-11-09 MED ORDER — INSULIN GLARGINE 100 UNIT/ML ~~LOC~~ SOLN
30.0000 [IU] | Freq: Every day | SUBCUTANEOUS | Status: DC
  Administered 2020-11-09 – 2020-11-10 (×2): 30 [IU] via SUBCUTANEOUS
  Filled 2020-11-09 (×5): qty 0.3

## 2020-11-09 MED ORDER — ORAL CARE MOUTH RINSE: 15.0000 mL | Freq: Once | OROMUCOSAL | Status: AC

## 2020-11-09 MED ORDER — LIDOCAINE HCL (PF) 2 % IJ SOLN
INTRAMUSCULAR | Status: AC
  Filled 2020-11-09: qty 5

## 2020-11-09 MED ORDER — LIDOCAINE 2% (20 MG/ML) 5 ML SYRINGE
INTRAMUSCULAR | Status: DC | PRN
  Administered 2020-11-09: 100 mg via INTRAVENOUS

## 2020-11-09 MED ORDER — GLUCAGON HCL RDNA (DIAGNOSTIC) 1 MG IJ SOLR
INTRAMUSCULAR | Status: DC | PRN
  Administered 2020-11-09: .25 mg via INTRAVENOUS

## 2020-11-09 MED ORDER — CHLORHEXIDINE GLUCONATE 0.12 % MT SOLN
15.0000 mL | Freq: Once | OROMUCOSAL | Status: AC
  Administered 2020-11-09: 15 mL via OROMUCOSAL

## 2020-11-09 MED ORDER — PROPOFOL 10 MG/ML IV BOLUS
INTRAVENOUS | Status: AC
  Filled 2020-11-09: qty 20

## 2020-11-09 MED ORDER — DEXAMETHASONE SODIUM PHOSPHATE 10 MG/ML IJ SOLN
INTRAMUSCULAR | Status: AC
  Filled 2020-11-09: qty 1

## 2020-11-09 MED ORDER — ONDANSETRON HCL 4 MG/2ML IJ SOLN
INTRAMUSCULAR | Status: DC | PRN
  Administered 2020-11-09: 4 mg via INTRAVENOUS

## 2020-11-09 MED ORDER — LACTATED RINGERS IV BOLUS
1000.0000 mL | Freq: Once | INTRAVENOUS | Status: DC
  Administered 2020-11-09: 1000 mL via INTRAVENOUS

## 2020-11-09 MED ORDER — HYDROMORPHONE HCL 1 MG/ML IJ SOLN: 0.2500 mg | INTRAMUSCULAR | Status: DC | PRN

## 2020-11-09 MED ORDER — SUGAMMADEX SODIUM 500 MG/5ML IV SOLN
INTRAVENOUS | Status: AC
  Filled 2020-11-09: qty 5

## 2020-11-09 MED ORDER — LACTATED RINGERS IV SOLN: INTRAVENOUS | Status: DC

## 2020-11-09 MED ORDER — ROCURONIUM BROMIDE 10 MG/ML (PF) SYRINGE
PREFILLED_SYRINGE | INTRAVENOUS | Status: DC | PRN
  Administered 2020-11-09: 40 mg via INTRAVENOUS

## 2020-11-09 MED ORDER — GLUCAGON HCL RDNA (DIAGNOSTIC) 1 MG IJ SOLR
INTRAMUSCULAR | Status: AC
  Filled 2020-11-09: qty 2

## 2020-11-09 MED ORDER — ROCURONIUM BROMIDE 10 MG/ML (PF) SYRINGE
PREFILLED_SYRINGE | INTRAVENOUS | Status: AC
  Filled 2020-11-09: qty 10

## 2020-11-09 MED ORDER — SUCCINYLCHOLINE CHLORIDE 20 MG/ML IJ SOLN
INTRAMUSCULAR | Status: DC | PRN
  Administered 2020-11-09: 140 mg via INTRAVENOUS

## 2020-11-09 MED ORDER — PROPOFOL 10 MG/ML IV BOLUS
INTRAVENOUS | Status: DC | PRN
  Administered 2020-11-09: 150 mg via INTRAVENOUS

## 2020-11-09 MED ORDER — SUGAMMADEX SODIUM 200 MG/2ML IV SOLN
INTRAVENOUS | Status: DC | PRN
  Administered 2020-11-09: 200 mg via INTRAVENOUS

## 2020-11-09 SURGICAL SUPPLY — 22 items
BALLN RETRIEVAL 12X15 (BALLOONS) IMPLANT
BASKET TRAPEZOID 3X6 (MISCELLANEOUS) IMPLANT
BASKET TRAPEZOID LITHO 2.0X5 (MISCELLANEOUS) IMPLANT
DEVICE INFLATION ENCORE 26 (MISCELLANEOUS) IMPLANT
DEVICE LOCKING W-BIOPSY CAP (MISCELLANEOUS) IMPLANT
GUIDEWIRE HYDRA JAGWIRE .35 (WIRE) IMPLANT
GUIDEWIRE JAG HINI 025X260CM (WIRE) IMPLANT
KIT ENDO PROCEDURE PEN (KITS) ×2 IMPLANT
KIT TURNOVER KIT A (KITS) ×2 IMPLANT
LUBRICANT JELLY 4.5OZ STERILE (MISCELLANEOUS) IMPLANT
PAD ARMBOARD 7.5X6 YLW CONV (MISCELLANEOUS) ×2 IMPLANT
POSITIONER HEAD 8X9X4 ADT (SOFTGOODS) IMPLANT
SCOPE SPY DS DISPOSABLE (MISCELLANEOUS) IMPLANT
SNARE ROTATE MED OVAL 20MM (MISCELLANEOUS) IMPLANT
SNARE SHORT THROW 13M SML OVAL (MISCELLANEOUS) IMPLANT
SPHINCTEROTOME AUTOTOME .25 (MISCELLANEOUS) IMPLANT
SPHINCTEROTOME HYDRATOME 44 (MISCELLANEOUS) IMPLANT
SYSTEM CONTINUOUS INJECTION (MISCELLANEOUS) IMPLANT
TUBING INSUFFLATOR CO2MPACT (TUBING) IMPLANT
WALLSTENT METAL COVERED 10X60 (STENTS) IMPLANT
WALLSTENT METAL COVERED 10X80 (STENTS) IMPLANT
WATER STERILE IRR 1000ML POUR (IV SOLUTION) ×2 IMPLANT

## 2020-11-09 NOTE — Anesthesia Procedure Notes (Signed)
Procedure Name: Intubation Date/Time: 11/09/2020 1:21 PM Performed by: Tacy Learn, CRNA Pre-anesthesia Checklist: Patient identified, Emergency Drugs available, Suction available, Patient being monitored and Timeout performed Patient Re-evaluated:Patient Re-evaluated prior to induction Oxygen Delivery Method: Circle system utilized Preoxygenation: Pre-oxygenation with 100% oxygen Induction Type: IV induction Laryngoscope Size: Miller and 3 Grade View: Grade I Tube type: Oral Tube size: 8.0 mm Number of attempts: 1 Airway Equipment and Method: Stylet Placement Confirmation: ETT inserted through vocal cords under direct vision, CO2 detector, breath sounds checked- equal and bilateral and positive ETCO2 Secured at: 23 cm Tube secured with: Tape Dental Injury: Teeth and Oropharynx as per pre-operative assessment

## 2020-11-09 NOTE — Progress Notes (Signed)
[facility] Progress Note  Day of Surgery  Subjective: No major drainage. Perianal region feeling better. MRCP concerning for pancreatic cancer. ERCP today with Dr. [name].   Objective: Vital signs in last 24 hours: Temp:  [97.7 F (36.5 C)-98.6 F (37 C)] 98.3 F (36.8 C) (06/23 1137) Pulse Rate:  [72-86] 72 (06/23 1137) Resp:  [17-18] 17 (06/23 1137) BP: (131-146)/(67-82) 144/82 (06/23 1137) SpO2:  [96 %-98 %] 98 % (06/23 1137) Last BM Date: 11/05/20  Intake/Output from previous day: 06/22 0701 - 06/23 0700 In: 1105 [P.O.:730; I.V.:75; IV Piggyback:300] Out: -  Intake/Output this shift: Total I/O In: 240 [P.O.:240] Out: -   General appearance: alert and no distress Resp: normal work of breathing GI: soft, nondistended, nontender, perianal abscess, less induration and drained, opened are of skin note  Lab Results:  Recent Labs    11/07/20 0637 11/08/20 0456  WBC 5.6 5.0  HGB 11.5* 11.7*  HCT 34.3* 34.4*  PLT 396 400   BMET Recent Labs    11/07/20 1027 11/08/20 0456  NA 135 136  K 3.6 3.5  CL 105 106  CO2 24 22  GLUCOSE 171* 268*  BUN 18 14  CREATININE 1.45* 1.42*  CALCIUM 8.6* 8.7*   PT/INR Recent Labs    11/07/20 0637  LABPROT 13.1  INR 1.0    Studies/Results: MR ABDOMEN MRCP WO CONTRAST  Result Date: 11/08/2020 CLINICAL DATA:  Cholelithiasis, evaluate for choledocholithiasis EXAM: MRI ABDOMEN WITHOUT CONTRAST  (INCLUDING MRCP) TECHNIQUE: Multiplanar multisequence MR imaging of the abdomen was performed. Heavily T2-weighted images of the biliary and pancreatic ducts were obtained, and three-dimensional MRCP images were rendered by post processing. COMPARISON:  None. FINDINGS: Lower chest: No acute findings. Hepatobiliary: No mass or other parenchymal abnormality identified. Intra and extrahepatic biliary ductal dilatation, with relatively abrupt truncation of the central common bile duct just proximal to the ampulla. The central  common bile duct measures up to 1.1 cm in caliber. Distended gallbladder. Pancreas: There is diffuse pancreatic ductal dilatation in the superior pancreatic head and distally, measuring up to 6 mm in caliber. There is a somewhat ill-defined, T1 and T2 isointense mass of the central pancreatic head and uncinate measuring approximately 2.3 x 2.0 cm (series 4, image 23, series 5, image 15). Spleen:  Within normal limits in size and appearance. Adrenals/Urinary Tract: No masses identified. No evidence of hydronephrosis. Stomach/Bowel: Visualized portions within the abdomen are unremarkable. Vascular/Lymphatic: No pathologically enlarged lymph nodes identified. No abdominal aortic aneurysm demonstrated. Other:  None. Musculoskeletal: No suspicious bone lesions identified. IMPRESSION: 1. There is a somewhat ill-defined, T1 and T2 isointense mass of the central pancreatic head and uncinate measuring approximately 2.3 x 2.0 cm. 2. Intra and extrahepatic biliary ductal dilatation as well as a distended gallbladder, with relatively abrupt truncation of the central common bile duct just proximal to the ampulla. 3. There is also diffuse pancreatic ductal dilatation in the superior pancreatic head and distally, with abrupt truncation in the pancreatic head. 4. Constellation of findings is highly suspicious for pancreatic adenocarcinoma. There is no obvious evidence of abdominal metastatic disease on noncontrast MR. Electronically Signed   By: Eddie Candle M.D.   On: 11/08/2020 11:35   MR 3D Recon At Scanner  Result Date: 11/08/2020 CLINICAL DATA:  Cholelithiasis, evaluate for choledocholithiasis EXAM: MRI ABDOMEN WITHOUT CONTRAST  (INCLUDING MRCP) TECHNIQUE: Multiplanar multisequence MR imaging of the abdomen was performed. Heavily T2-weighted images of the biliary and pancreatic ducts were obtained, and three-dimensional MRCP  images were rendered by post processing. COMPARISON:  None. FINDINGS: Lower chest: No acute  findings. Hepatobiliary: No mass or other parenchymal abnormality identified. Intra and extrahepatic biliary ductal dilatation, with relatively abrupt truncation of the central common bile duct just proximal to the ampulla. The central common bile duct measures up to 1.1 cm in caliber. Distended gallbladder. Pancreas: There is diffuse pancreatic ductal dilatation in the superior pancreatic head and distally, measuring up to 6 mm in caliber. There is a somewhat ill-defined, T1 and T2 isointense mass of the central pancreatic head and uncinate measuring approximately 2.3 x 2.0 cm (series 4, image 23, series 5, image 15). Spleen:  Within normal limits in size and appearance. Adrenals/Urinary Tract: No masses identified. No evidence of hydronephrosis. Stomach/Bowel: Visualized portions within the abdomen are unremarkable. Vascular/Lymphatic: No pathologically enlarged lymph nodes identified. No abdominal aortic aneurysm demonstrated. Other:  None. Musculoskeletal: No suspicious bone lesions identified. IMPRESSION: 1. There is a somewhat ill-defined, T1 and T2 isointense mass of the central pancreatic head and uncinate measuring approximately 2.3 x 2.0 cm. 2. Intra and extrahepatic biliary ductal dilatation as well as a distended gallbladder, with relatively abrupt truncation of the central common bile duct just proximal to the ampulla. 3. There is also diffuse pancreatic ductal dilatation in the superior pancreatic head and distally, with abrupt truncation in the pancreatic head. 4. Constellation of findings is highly suspicious for pancreatic adenocarcinoma. There is no obvious evidence of abdominal metastatic disease on noncontrast MR. Electronically Signed   By: Eddie Candle M.D.   On: 11/08/2020 11:35   NM Hepato W/EF  Result Date: 11/08/2020 CLINICAL DATA:  RIGHT upper quadrant pain, thickened gallbladder wall with sludge question cholecystitis EXAM: NUCLEAR MEDICINE HEPATOBILIARY IMAGING TECHNIQUE: Sequential  images of the abdomen were obtained out to 60 minutes following intravenous administration of radiopharmaceutical. RADIOPHARMACEUTICALS:  4.8 mCi Tc-40m  Choletec IV COMPARISON:  Ultrasound abdomen 11/07/2020, MRI/MRCP 11/08/2020 FINDINGS: Mildly delayed clearance of tracer from blood pool indicating component of hepatocellular dysfunction. Delayed excretion of tracer into biliary tree. At 1 hour, all of the tracer remains within the liver parenchyma. At 2 hours, all tracer remains within liver parenchyma, without visualization of the biliary tree. Delayed image at 4 hours reveals tracer within the small bowel indicating patency of the common bile duct. Additionally, tracer is not visualized within the gallbladder consistent with patent cystic duct. IMPRESSION: Hepatocellular dysfunction. Delayed visualization of tracer within the gallbladder, indicating presence of a patent cystic duct and an absence of acute cholecystitis; however, delayed visualization of the gallbladder can be seen in patients with chronic cholecystitis. Electronically Signed   By: Lavonia Dana M.D.   On: 11/08/2020 13:12   US Abdomen Limited RUQ (LIVER/GB)  Result Date: 11/07/2020 CLINICAL DATA:  Elevated LFTs. EXAM: ULTRASOUND ABDOMEN LIMITED RIGHT UPPER QUADRANT COMPARISON:  None. FINDINGS: Gallbladder: Gallbladder is distended with layering sludge. Gallbladder wall thickness upper normal to mildly increased at 3-4 mm. No appreciable pericholecystic fluid. The sonographer reports no sonographic Murphy sign. Common bile duct: Diameter: Dilated up to 7-10 mm diameter. Liver: Increased echogenicity suggests fatty deposition. Mild intrahepatic biliary duct dilatation evident. Portal vein is patent on color Doppler imaging with normal direction of blood flow towards the liver. Other: None. IMPRESSION: 1. Distended gallbladder with borderline to mild gallbladder wall thickening. No gallstones evident by ultrasound. If there is clinical concern  for cystic duct obstruction, nuclear scintigraphy may prove helpful to further evaluate. Given the biliary dilatation, MRI/MRCP may also prove helpful.  2. Probable hepatic steatosis. Electronically Signed   By: Misty Stanley M.D.   On: 11/07/2020 15:12    Anti-infectives: Anti-infectives (From admission, onward)    Start     Dose/Rate Route Frequency Ordered Stop   11/07/20 1230  [MAR Hold]  cefTRIAXone (ROCEPHIN) 2 g in sodium chloride 0.9 % 100 mL IVPB        (MAR Hold since Thu 11/09/2020 at 1120.Hold Reason: Transfer to a Procedural area)   2 g 200 mL/hr over 30 Minutes Intravenous Every 24 hours 11/07/20 1150     11/07/20 1000  [MAR Hold]  vancomycin (VANCOCIN) IVPB 1000 mg/200 mL premix        (MAR Hold since Thu 11/09/2020 at 1120.Hold Reason: Transfer to a Procedural area)   1,000 mg 200 mL/hr over 60 Minutes Intravenous Every 12 hours 11/07/20 0531     11/07/20 0500  vancomycin (VANCOCIN) IVPB 1000 mg/200 mL premix  Status:  Discontinued        1,000 mg 200 mL/hr over 60 Minutes Intravenous  Once 11/07/20 0415 11/07/20 0416   11/07/20 0000  vancomycin (VANCOREADY) IVPB 1750 mg/350 mL        1,750 mg 175 mL/hr over 120 Minutes Intravenous  Once 11/06/20 2349 11/07/20 0315   11/07/20 0000  piperacillin-tazobactam (ZOSYN) IVPB 3.375 g        3.375 g 100 mL/hr over 30 Minutes Intravenous  Once 11/06/20 2349 11/07/20 0110   11/07/20 0000  clindamycin (CLEOCIN) IVPB 900 mg        900 mg 100 mL/hr over 30 Minutes Intravenous  Once 11/06/20 2349 11/07/20 0113   11/06/20 2315  ciprofloxacin (CIPRO) IVPB 400 mg  Status:  Discontinued        400 mg 200 mL/hr over 60 Minutes Intravenous  Once 11/06/20 2301 11/06/20 2347   11/06/20 2315  metroNIDAZOLE (FLAGYL) IVPB 500 mg  Status:  Discontinued        500 mg 100 mL/hr over 60 Minutes Intravenous  Once 11/06/20 2301 11/06/20 2347       Assessment/Plan: Alexander Banks is a 65 yo with perianal abscess that is resolving. Now with pancreatic mass  and ERCP today to see if can get diagnosis. Dr. [name] will refer for EUS if needed. Told patient if would need to see Surgery at [facility] or [facility], [place] regarding the pancreatic mass.  I am happy to see him to check on perianal abscess if needed, complete antibiotics, He also can follow with Dr. Wolfgang Phoenix if easier   LOS: 2 days    Virl Cagey 11/09/2020

## 2020-11-09 NOTE — Anesthesia Preprocedure Evaluation (Addendum)
Anesthesia Evaluation  Patient identified by MRN, date of birth, ID band Patient awake    Reviewed: Allergy & Precautions, NPO status , Patient's Chart, lab work & pertinent test results  History of Anesthesia Complications Negative for: history of anesthetic complications  Airway Mallampati: II  TM Distance: >3 FB Neck ROM: Full    Dental  (+) Dental Advisory Given, Teeth Intact   Pulmonary sleep apnea and Continuous Positive Airway Pressure Ventilation , Current Smoker and Patient abstained from smoking.,    Pulmonary exam normal breath sounds clear to auscultation       Cardiovascular Exercise Tolerance: Poor hypertension, Pt. on medications Normal cardiovascular exam Rhythm:Regular Rate:Normal     Neuro/Psych negative neurological ROS  negative psych ROS   GI/Hepatic negative GI ROS, Neg liver ROS,   Endo/Other  diabetes, Poorly Controlled, Type 2, Insulin DependentHypothyroidism   Renal/GU Renal InsufficiencyRenal disease (AKI)     Musculoskeletal  (+) Arthritis , Osteoarthritis,    Abdominal   Peds  Hematology  (+) anemia ,   Anesthesia Other Findings   Reproductive/Obstetrics                            Anesthesia Physical Anesthesia Plan  ASA: 4  Anesthesia Plan: General   Post-op Pain Management:    Induction: Intravenous  PONV Risk Score and Plan: 3 and Ondansetron  Airway Management Planned: Oral ETT  Additional Equipment:   Intra-op Plan:   Post-operative Plan: Extubation in OR  Informed Consent: I have reviewed the patients History and Physical, chart, labs and discussed the procedure including the risks, benefits and alternatives for the proposed anesthesia with the patient or authorized representative who has indicated his/her understanding and acceptance.       Plan Discussed with: CRNA and Surgeon  Anesthesia Plan Comments:         Anesthesia  Quick Evaluation

## 2020-11-09 NOTE — Progress Notes (Signed)
PROGRESS NOTE   Alexander Banks  NFA:[id number] DOB: July 11, 1955 DOA: 11/06/2020 PCP: [name], MD   Chief Complaint  Patient presents with   Hyperglycemia   Level of care: Med-Surg  Brief Admission History:  65 y.o. male with medical history significant for T2DM, hypertension, hyperlipidemia, hypothyroidism who presents to the emergency department for evaluation of elevated blood glucose level.  Patient complained of 3-week onset of increased thirst and urination and weight loss.  He also complained of 1 week onset of possible abscess in his buttock which was popped by wife at home with relief, he complained of subjective fever and purulent drainage for about 4 to 5 days and was seen at an urgent care on 11/03/2020 where patient was prescribed with Augmentin and Flagyl and advised on conservative treatment.  He was seen in the ED on 6/18 where patient was suspected to have early onset of DKA, acute kidney injury and abscess of buttock.  IV hydration was provided, insulin was given and patient was advised to follow-up with his PCP.  He followed with his PCP yesterday (6/20), lab work done showed elevated beta hydroxybutyrate level as well as increased anion gap with creatinine being above baseline level, his PCP consulted with an endocrinologist who recommends that patient should go to an ED since patient was still in DKA.  He denies nausea, vomiting, chest pain, shortness of breath   Assessment & Plan:   Principal Problem:   DKA (diabetic ketoacidosis) ([place]) Active Problems:   Hyperlipidemia   Essential hypertension, benign   Hypothyroidism   Hyperglycemia due to diabetes mellitus ([place])   Perirectal abscess   Hyponatremia   Macrocytic anemia   AKI (acute kidney injury) ([place])   Dehydration   Cellulitis   Pancreatic mass   Chronic cholecystitis   Elevated LFTs   DKA - resolved now.  He was treated with IV insulin with good results.    Uncontrolled type 2 diabetes mellitus -  remains uncontrolled, increased prandial and basal coverage with increased CBG testing.  A1c was 12.8% which indicates very poorly controlled disease.  Increased lantus to 30 units, increased novolog to 10 units TIDAC.   Cellulitis / Gluteal abscess - discussed with Dr. Constance Haw, improving with spontaneous drainage, continue antibiotics and wound care as ordered.  Continue sitz baths.   AKI - prerenal - resolved with IV fluid hydration.   Hyperlipidemia - continue statin therapy.     Bile duct stricture with small pancreatic mass - MRCP concerning for pancreatic cancer, GI taking him for ERCP with brush cytology today.  He will need outpatient EUS. He will need outpatient follow up with surgery at tertiary care center.    Hypothyroidism - resumed home levothyroxine.    DVT prophylaxis: enoxaparin  Code Status: full  Family Communication: updated at bedside  Disposition: home  Status is: Inpatient  Remains inpatient appropriate because:Inpatient level of care appropriate due to severity of illness  Dispo: The patient is from: Home              Anticipated d/c is to: Home              Patient currently is not medically stable to d/c.   Difficult to place patient No  Consultants:  Surgery GI   Procedures:    Antimicrobials:  N/a   Subjective: Pt without complaints.     Objective: Vitals:   11/08/20 1411 11/08/20 2041 11/09/20 0449 11/09/20 1137  BP: (!) 146/78 138/76 131/67 Marland Kitchen)  144/82  Pulse: 86 81 80 72  Resp: 17 18 18 17   Temp: 98.6 F (37 C) 97.7 F (36.5 C) 97.8 F (36.6 C) 98.3 F (36.8 C)  TempSrc: Oral Oral Oral Oral  SpO2: 97% 96% 97% 98%  Weight:      Height:        Intake/Output Summary (Last 24 hours) at 11/09/2020 1332 Last data filed at 11/09/2020 1300 Gross per 24 hour  Intake 1705 ml  Output --  Net 1705 ml   Filed Weights   11/06/20 1828 11/07/20 0151  Weight: 90.3 kg 92.9 kg    Examination:  General exam: chronically ill appearing male,  awake, alert, cooperative, Appears calm and comfortable  Respiratory system: Clear to auscultation. Respiratory effort normal. Cardiovascular system: normal S1 & S2 heard. No JVD, murmurs, rubs, gallops or clicks. No pedal edema. Gastrointestinal system: Abdomen is nondistended, soft and nontender. No organomegaly or masses felt. Normal bowel sounds heard. Central nervous system: Alert and oriented. No focal neurological deficits. Extremities: Symmetric 5 x 5 power. Skin: No rashes, lesions or ulcers Psychiatry: Judgement and insight appear normal. Mood & affect appropriate.   Data Reviewed: I have personally reviewed following labs and imaging studies  CBC: Recent Labs  Lab 11/04/20 1223 11/06/20 1343 11/06/20 2033 11/07/20 0637 11/08/20 0456  WBC 8.4 6.8 5.4 5.6 5.0  NEUTROABS 6.6 4.9 3.8  --   --   HGB 12.6* 12.6* 12.2* 11.5* 11.7*  HCT 37.6* 38.6* 36.2* 34.3* 34.4*  MCV 98.9 101.0* 99.7 99.1 98.6  PLT 385 410* 378 396 884    Basic Metabolic Panel: Recent Labs  Lab 11/07/20 0329 11/07/20 0637 11/07/20 0702 11/07/20 1027 11/08/20 0456  NA 134* 135 136 135 136  K 3.6 3.5 3.5 3.6 3.5  CL 100 105 105 105 106  CO2 22 23 23 24 22   GLUCOSE 148* 156* 151* 171* 268*  BUN 21 20 20 18 14   CREATININE 1.38* 1.42* 1.44* 1.45* 1.42*  CALCIUM 9.0 8.8* 8.7* 8.6* 8.7*  MG  --  1.8  --   --  1.7  PHOS  --  2.4*  --   --   --     GFR: Estimated Creatinine Clearance: 63.6 mL/min (A) (by C-G formula based on SCr of 1.42 mg/dL (H)).  Liver Function Tests: Recent Labs  Lab 11/04/20 1223 11/07/20 0637 11/08/20 0456 11/09/20 0501  AST 22 434* 111* 79*  ALT 58* 281* 198* 166*  ALKPHOS 279* 596* 508* 543*  BILITOT 1.1 1.7* 1.1 1.1  PROT 7.6 6.1* 6.1* 6.1*  ALBUMIN 3.3* 2.8* 2.7* 2.7*    CBG: Recent Labs  Lab 11/08/20 1106 11/08/20 2039 11/09/20 0748 11/09/20 1112 11/09/20 1203  GLUCAP 253* 308* 276* 328* 254*    Recent Results (from the past 240 hour(s))  Resp  Panel by RT-PCR (Flu A&B, Covid) Nasopharyngeal Swab     Status: None   Collection Time: 11/06/20 10:55 PM   Specimen: Nasopharyngeal Swab; Nasopharyngeal(NP) swabs in vial transport medium  Result Value Ref Range Status   SARS Coronavirus 2 by RT PCR NEGATIVE NEGATIVE Final    Comment: (NOTE) SARS-CoV-2 target nucleic acids are NOT DETECTED.  The SARS-CoV-2 RNA is generally detectable in upper respiratory specimens during the acute phase of infection. The lowest concentration of SARS-CoV-2 viral copies this assay can detect is 138 copies/mL. A negative result does not preclude SARS-Cov-2 infection and should not be used as the sole basis for treatment or other patient  management decisions. A negative result may occur with  improper specimen collection/handling, submission of specimen other than nasopharyngeal swab, presence of viral mutation(s) within the areas targeted by this assay, and inadequate number of viral copies(<138 copies/mL). A negative result must be combined with clinical observations, patient history, and epidemiological information. The expected result is Negative.  Fact Sheet for Patients:  EntrepreneurPulse.com.au  Fact Sheet for Healthcare Providers:  IncredibleEmployment.be  This test is no t yet approved or cleared by the [place] FDA and  has been authorized for detection and/or diagnosis of SARS-CoV-2 by FDA under an Emergency Use Authorization (EUA). This EUA will remain  in effect (meaning this test can be used) for the duration of the COVID-19 declaration under Section 564(b)(1) of the Act, 21 U.S.C.section 360bbb-3(b)(1), unless the authorization is terminated  or revoked sooner.       Influenza A by PCR NEGATIVE NEGATIVE Final   Influenza B by PCR NEGATIVE NEGATIVE Final    Comment: (NOTE) The Xpert Xpress SARS-CoV-2/FLU/RSV plus assay is intended as an aid in the diagnosis of influenza from Nasopharyngeal  swab specimens and should not be used as a sole basis for treatment. Nasal washings and aspirates are unacceptable for Xpert Xpress SARS-CoV-2/FLU/RSV testing.  Fact Sheet for Patients: EntrepreneurPulse.com.au  Fact Sheet for Healthcare Providers: IncredibleEmployment.be  This test is not yet approved or cleared by the [place] FDA and has been authorized for detection and/or diagnosis of SARS-CoV-2 by FDA under an Emergency Use Authorization (EUA). This EUA will remain in effect (meaning this test can be used) for the duration of the COVID-19 declaration under Section 564(b)(1) of the Act, 21 U.S.C. section 360bbb-3(b)(1), unless the authorization is terminated or revoked.  Performed at [facility], [address] [place], [place] [address]   MRSA Next Gen by PCR, Nasal     Status: None   Collection Time: 11/07/20  1:41 AM   Specimen: Nasal Mucosa; Nasal Swab  Result Value Ref Range Status   MRSA by PCR Next Gen NOT DETECTED NOT DETECTED Final    Comment: (NOTE) The GeneXpert MRSA Assay (FDA approved for NASAL specimens only), is one component of a comprehensive MRSA colonization surveillance program. It is not intended to diagnose MRSA infection nor to guide or monitor treatment for MRSA infections. Test performance is not FDA approved in patients less than 74 years old. Performed at [facility], [address] [place], [place] [address]      Radiology Studies: MR ABDOMEN MRCP WO CONTRAST  Result Date: 11/08/2020 CLINICAL DATA:  Cholelithiasis, evaluate for choledocholithiasis EXAM: MRI ABDOMEN WITHOUT CONTRAST  (INCLUDING MRCP) TECHNIQUE: Multiplanar multisequence MR imaging of the abdomen was performed. Heavily T2-weighted images of the biliary and pancreatic ducts were obtained, and three-dimensional MRCP images were rendered by post processing. COMPARISON:  None. FINDINGS: Lower chest: No acute findings. Hepatobiliary: No mass or  other parenchymal abnormality identified. Intra and extrahepatic biliary ductal dilatation, with relatively abrupt truncation of the central common bile duct just proximal to the ampulla. The central common bile duct measures up to 1.1 cm in caliber. Distended gallbladder. Pancreas: There is diffuse pancreatic ductal dilatation in the superior pancreatic head and distally, measuring up to 6 mm in caliber. There is a somewhat ill-defined, T1 and T2 isointense mass of the central pancreatic head and uncinate measuring approximately 2.3 x 2.0 cm (series 4, image 23, series 5, image 15). Spleen:  Within normal limits in size and appearance. Adrenals/Urinary Tract: No masses identified. No  evidence of hydronephrosis. Stomach/Bowel: Visualized portions within the abdomen are unremarkable. Vascular/Lymphatic: No pathologically enlarged lymph nodes identified. No abdominal aortic aneurysm demonstrated. Other:  None. Musculoskeletal: No suspicious bone lesions identified. IMPRESSION: 1. There is a somewhat ill-defined, T1 and T2 isointense mass of the central pancreatic head and uncinate measuring approximately 2.3 x 2.0 cm. 2. Intra and extrahepatic biliary ductal dilatation as well as a distended gallbladder, with relatively abrupt truncation of the central common bile duct just proximal to the ampulla. 3. There is also diffuse pancreatic ductal dilatation in the superior pancreatic head and distally, with abrupt truncation in the pancreatic head. 4. Constellation of findings is highly suspicious for pancreatic adenocarcinoma. There is no obvious evidence of abdominal metastatic disease on noncontrast MR. Electronically Signed   By: Eddie Candle M.D.   On: 11/08/2020 11:35   MR 3D Recon At Scanner  Result Date: 11/08/2020 CLINICAL DATA:  Cholelithiasis, evaluate for choledocholithiasis EXAM: MRI ABDOMEN WITHOUT CONTRAST  (INCLUDING MRCP) TECHNIQUE: Multiplanar multisequence MR imaging of the abdomen was performed.  Heavily T2-weighted images of the biliary and pancreatic ducts were obtained, and three-dimensional MRCP images were rendered by post processing. COMPARISON:  None. FINDINGS: Lower chest: No acute findings. Hepatobiliary: No mass or other parenchymal abnormality identified. Intra and extrahepatic biliary ductal dilatation, with relatively abrupt truncation of the central common bile duct just proximal to the ampulla. The central common bile duct measures up to 1.1 cm in caliber. Distended gallbladder. Pancreas: There is diffuse pancreatic ductal dilatation in the superior pancreatic head and distally, measuring up to 6 mm in caliber. There is a somewhat ill-defined, T1 and T2 isointense mass of the central pancreatic head and uncinate measuring approximately 2.3 x 2.0 cm (series 4, image 23, series 5, image 15). Spleen:  Within normal limits in size and appearance. Adrenals/Urinary Tract: No masses identified. No evidence of hydronephrosis. Stomach/Bowel: Visualized portions within the abdomen are unremarkable. Vascular/Lymphatic: No pathologically enlarged lymph nodes identified. No abdominal aortic aneurysm demonstrated. Other:  None. Musculoskeletal: No suspicious bone lesions identified. IMPRESSION: 1. There is a somewhat ill-defined, T1 and T2 isointense mass of the central pancreatic head and uncinate measuring approximately 2.3 x 2.0 cm. 2. Intra and extrahepatic biliary ductal dilatation as well as a distended gallbladder, with relatively abrupt truncation of the central common bile duct just proximal to the ampulla. 3. There is also diffuse pancreatic ductal dilatation in the superior pancreatic head and distally, with abrupt truncation in the pancreatic head. 4. Constellation of findings is highly suspicious for pancreatic adenocarcinoma. There is no obvious evidence of abdominal metastatic disease on noncontrast MR. Electronically Signed   By: Eddie Candle M.D.   On: 11/08/2020 11:35   NM Hepato  W/EF  Result Date: 11/08/2020 CLINICAL DATA:  RIGHT upper quadrant pain, thickened gallbladder wall with sludge question cholecystitis EXAM: NUCLEAR MEDICINE HEPATOBILIARY IMAGING TECHNIQUE: Sequential images of the abdomen were obtained out to 60 minutes following intravenous administration of radiopharmaceutical. RADIOPHARMACEUTICALS:  4.8 mCi Tc-12m  Choletec IV COMPARISON:  Ultrasound abdomen 11/07/2020, MRI/MRCP 11/08/2020 FINDINGS: Mildly delayed clearance of tracer from blood pool indicating component of hepatocellular dysfunction. Delayed excretion of tracer into biliary tree. At 1 hour, all of the tracer remains within the liver parenchyma. At 2 hours, all tracer remains within liver parenchyma, without visualization of the biliary tree. Delayed image at 4 hours reveals tracer within the small bowel indicating patency of the common bile duct. Additionally, tracer is not visualized within the gallbladder consistent with patent  cystic duct. IMPRESSION: Hepatocellular dysfunction. Delayed visualization of tracer within the gallbladder, indicating presence of a patent cystic duct and an absence of acute cholecystitis; however, delayed visualization of the gallbladder can be seen in patients with chronic cholecystitis. Electronically Signed   By: Lavonia Dana M.D.   On: 11/08/2020 13:12    Scheduled Meds:  [MAR Hold] Chlorhexidine Gluconate Cloth  6 each Topical Daily   glucagon (human recombinant)       [MAR Hold] insulin aspart  0-15 Units Subcutaneous TID WC   [MAR Hold] insulin aspart  0-5 Units Subcutaneous QHS   insulin aspart  10 Units Subcutaneous TID WC   insulin glargine  30 Units Subcutaneous Daily   Continuous Infusions:  sodium chloride     [MAR Hold] cefTRIAXone (ROCEPHIN)  IV 2 g (11/08/20 1637)   lactated ringers 75 mL/hr at 11/08/20 1754   lactated ringers 50 mL/hr at 11/09/20 1208   [MAR Hold] vancomycin 1,000 mg (11/09/20 0829)    LOS: 2 days   Time spent: 35 mins    Tina Temme Wynetta Emery, MD How to contact the [facility] Attending or Consulting provider [place] or covering provider during after hours [place], for this patient?  Check the care team in [facility] and look for a) attending/consulting [facility] provider listed and b) the [facility] team listed Log into [email or web address] and use [place] universal password to access. If you do not have the password, please contact the hospital operator. Locate the [facility] provider you are looking for under [facility] and page to a number that you can be directly reached. If you still have difficulty reaching the provider, please page the [facility] (Director on Call) for the Hospitalists listed on amion for assistance.  11/09/2020, 1:32 PM

## 2020-11-09 NOTE — Progress Notes (Signed)
Brief ERCP note.  Limited view of esophagus and stomach.  No abnormality noted. Normal ampulla of Vater. Cholangiogram revealed short distal CBD stricture with upstream dilation of CBD and CHD. Gallbladder not filled with contrast. Short sphincterotomy performed to facilitate stenting. Bile duct stricture brushed for cytology. 10 French 5 cm plastic biliary stent placed for decompression. Patient tolerated procedure well.

## 2020-11-09 NOTE — Progress Notes (Signed)
Inpatient Diabetes Program Recommendations  AACE/ADA: New Consensus Statement on Inpatient Glycemic Control   Target Ranges:  Prepandial:   less than 140 mg/dL      Peak postprandial:   less than 180 mg/dL (1-2 hours)      Critically ill patients:  140 - 180 mg/dL  Results for BRACKEN, MOFFA (MRN [id number]) as of 11/09/2020 08:13  Ref. Range 11/08/2020 07:07 11/08/2020 11:06 11/08/2020 16:35 11/08/2020 20:39 11/09/2020 07:48  Glucose-Capillary Latest Ref Range: 70 - 99 mg/dL 277 (H) 253 (H)  Novolog 8 units  Lantus 24 units   Novolog 23 units  308 (H)  Novolog 4 units 276 (H)     Review of Glycemic Control  Diabetes history: DM2 Outpatient Diabetes medications: Levemir 10 units daily, Humalog 5 units TID with meals (insulin just started 11/04/20) Current orders for Inpatient glycemic control:  Lantus 24 units daily, Novolog 8 units TID with meals, Novolog 0-15 units TID with meals, Novolog 0-5 units QHS   Inpatient Diabetes Program Recommendations:    Insulin: Please consider increasing Lantus to 30 units daily.  Thanks, Barnie Alderman, RN, MSN, CDE Diabetes Coordinator Inpatient Diabetes Program [phone] (Team Pager from 8am to 5pm)

## 2020-11-09 NOTE — Anesthesia Postprocedure Evaluation (Signed)
Anesthesia Post Note  Patient: Alexander Banks  Procedure(s) Performed: ENDOSCOPIC RETROGRADE CHOLANGIOPANCREATOGRAPHY (ERCP) SHORT BILIARY SPHINCTEROTOMY BILIARY STENT PLACEMENT - 10 F X 5 CM STENT  Patient location during evaluation: PACU Anesthesia Type: General Level of consciousness: awake and alert and oriented Pain management: pain level controlled Vital Signs Assessment: post-procedure vital signs reviewed and stable Respiratory status: spontaneous breathing and respiratory function stable Cardiovascular status: blood pressure returned to baseline and stable Postop Assessment: no apparent nausea or vomiting Anesthetic complications: no   No notable events documented.   Last Vitals:  Vitals:   11/09/20 1415 11/09/20 1430  BP: (!) 141/70 124/67  Pulse: 81 79  Resp: 13 12  Temp:    SpO2: 96% 99%    Last Pain:  Vitals:   11/09/20 1407  TempSrc:   PainSc: 0-No pain                 Shakesha Soltau C Maxim Bedel

## 2020-11-09 NOTE — Transfer of Care (Signed)
Immediate Anesthesia Transfer of Care Note  Patient: Alexander Banks  Procedure(s) Performed: ENDOSCOPIC RETROGRADE CHOLANGIOPANCREATOGRAPHY (ERCP) SHORT BILIARY SPHINCTEROTOMY WITH 10 F X 5 CM STENT  Patient Location: PACU  Anesthesia Type:General  Level of Consciousness: awake, alert , oriented and patient cooperative  Airway & Oxygen Therapy: Patient Spontanous Breathing  Post-op Assessment: Report given to RN, Post -op Vital signs reviewed and stable and Patient moving all extremities  Post vital signs: Reviewed and stable  Last Vitals:  Vitals Value Taken Time  BP    Temp    Pulse    Resp    SpO2      Last Pain:  Vitals:   11/09/20 1137  TempSrc: Oral  PainSc: 0-No pain      Patients Stated Pain Goal: 7 (99/24/26 8341)  Complications: No notable events documented.

## 2020-11-09 NOTE — Op Note (Signed)
[facility] Patient Name: Alexander Banks Procedure Date: 11/09/2020 1:02 PM MRN: [id number] Date of Birth: January 21, 1956 Attending MD: Hildred Laser , MD CSN: [id number] Age: 65 Admit Type: Outpatient Procedure:                ERCP Indications:              Suspect malignant stricture of the common bile duct Providers:                Hildred Laser, MD, Charlsie Quest. Theda Sers RN, RN, Raphael Gibney Tech., Technician, Aram Candela Referring MD:             Irwin Brakeman, MD Medicines:                General Anesthesia Complications:            No immediate complications. Estimated Blood Loss:     Estimated blood loss: none. Procedure:                Pre-Anesthesia Assessment:                           - Prior to the procedure, a History and Physical                            was performed, and patient medications and                            allergies were reviewed. The patient's tolerance of                            previous anesthesia was also reviewed. The risks                            and benefits of the procedure and the sedation                            options and risks were discussed with the patient.                            All questions were answered, and informed consent                            was obtained. Prior Anticoagulants: The patient has                            taken no previous anticoagulant or antiplatelet                            agents. ASA Grade Assessment: III - A patient with                            severe systemic disease. After reviewing the risks  and benefits, the patient was deemed in                            satisfactory condition to undergo the procedure.                           After obtaining informed consent, the scope was                            passed under direct vision. Throughout the                            procedure, the patient's blood pressure, pulse, and                             oxygen saturations were monitored continuously. The                            [employer] single use                            duodenoscope was introduced through the mouth, and                            used to inject contrast into and used to cannulate                            the bile duct. The ERCP was accomplished without                            difficulty. The patient tolerated the procedure                            well. Findings:      The scout film was normal. The esophagus was successfully intubated       under direct vision. The scope was advanced to a normal major papilla in       the descending duodenum without detailed examination of the pharynx,       larynx and associated structures, and upper GI tract. The upper GI tract       was grossly normal. A 0.035 inch x 260 cm straight Hydra Jagwire was       passed into the biliary tree. The Hydratome sphincterotome was passed       over the guidewire and the bile duct was then deeply cannulated.       Contrast was injected. I personally interpreted the bile duct images.       There was brisk flow of contrast through the ducts. Image quality was       excellent. Contrast extended to the entire biliary tree. The common bile       duct contained a single severe stenosis 1 mm in length. The entire       biliary tree was moderately dilated and diffusely dilated, secondary to       a stricture. The largest diameter was 13 mm. Cells for cytology were  obtained by brushing in the lower third of the main bile duct. A 4 mm       biliary sphincterotomy was made with a monofilament Hydratome       sphincterotome using ERBE electrocautery. There was no       post-sphincterotomy bleeding. One 10 Fr by 5 cm plastic stent with a       single external flap and a single internal flap was placed 4.5 cm into       the common bile duct. Fluid and bile flowed through the stent. The stent        was in good position. Impression:               - A single severe biliary stricture was found in                            the common bile duct. The stricture was malignant                            appearing.                           - The entire biliary tree was moderately dilated,                            secondary to a stricture.                           - Cells for cytology obtained in the lower third of                            the main duct.                           - A biliary sphincterotomy was performed.                           - One plastic stent was placed into the common bile                            duct. Moderate Sedation:      Per Anesthesia Care Recommendation:           - Return patient to hospital ward for ongoing care.                           - Avoid aspirin and nonsteroidal anti-inflammatory                            medicines for 3 days.                           - Continue present medications.                           - Diabetic (ADA) diet today.                           -  CA 19?"9 Procedure Code(s):        --- Professional ---                           [phone], Endoscopic retrograde                            cholangiopancreatography (ERCP); with placement of                            endoscopic stent into biliary or pancreatic duct,                            including pre- and post-dilation and guide wire                            passage, when performed, including sphincterotomy,                            when performed, each stent Diagnosis Code(s):        --- Professional ---                           K83.1, Obstruction of bile duct CPT copyright 2019 American [employer] Association. All rights reserved. The codes documented in this report are preliminary and upon coder review may  be revised to meet current compliance requirements. Hildred Laser, MD Hildred Laser, MD 11/09/2020 2:24:14 PM This report has been signed  electronically. Number of Addenda: 0

## 2020-11-09 NOTE — Progress Notes (Signed)
Subjective:  Patient has no complaints.  He denies chest pain shortness of breath or abdominal pain.  He is not have any more drainage from perineal abscess.  Current Medications:  Current Facility-Administered Medications:    cefTRIAXone (ROCEPHIN) 2 g in sodium chloride 0.9 % 100 mL IVPB, 2 g, Intravenous, Q24H, Tat, [name], MD, Last Rate: 200 mL/hr at 11/08/20 1637, 2 g at 11/08/20 1637   Chlorhexidine Gluconate Cloth 2 % PADS 6 each, 6 each, Topical, Daily, Tat, [name], MD, 6 each at 11/09/20 0833   dextrose 50 % solution 0-50 mL, 0-50 mL, Intravenous, PRN, Tat, [name], MD   insulin aspart (novoLOG) injection 0-15 Units, 0-15 Units, Subcutaneous, TID WC, Tat, [name], MD, 15 Units at 11/08/20 1634   insulin aspart (novoLOG) injection 0-5 Units, 0-5 Units, Subcutaneous, QHS, Tat, [name], MD, 4 Units at 11/08/20 2102   insulin aspart (novoLOG) injection 8 Units, 8 Units, Subcutaneous, TID WC, [name], [name], MD, 8 Units at 11/08/20 1635   insulin glargine (LANTUS) injection 24 Units, 24 Units, Subcutaneous, Daily, [name], [name], MD, 24 Units at 11/08/20 1222   lactated ringers infusion, , Intravenous, Continuous, Tat, [name], MD, Last Rate: 75 mL/hr at 11/08/20 1754, New Bag at 11/08/20 1754   vancomycin (VANCOCIN) IVPB 1000 mg/200 mL premix, 1,000 mg, Intravenous, Q12H, Tat, [name], MD, Last Rate: 200 mL/hr at 11/09/20 0829, 1,000 mg at 11/09/20 0829   Objective: Blood pressure 131/67, pulse 80, temperature 97.8 F (36.6 C), temperature source Oral, resp. rate 18, height 6' 3"  (1.905 m), weight 92.9 kg, SpO2 97 %. Patient is alert and in no acute distress. Conjunctiva is pink.  Sclera is nonicteric. No neck masses or thyromegaly noted. Cardiac exam with regular rhythm normal S1 and S2.  No murmur gallop noted. Auscultation lungs reveal vesicular breath sounds bilaterally. Abdomen is symmetrical soft and nontender with organomegaly or masses. He does not have peripheral edema or  clubbing. Labs/studies Results:   CBC Latest Ref Rng & Units 11/08/2020 11/07/2020 11/06/2020  WBC 4.0 - 10.5 K/uL 5.0 5.6 5.4  Hemoglobin 13.0 - 17.0 g/dL 11.7(L) 11.5(L) 12.2(L)  Hematocrit 39.0 - 52.0 % 34.4(L) 34.3(L) 36.2(L)  Platelets 150 - 400 K/uL 400 396 378    CMP Latest Ref Rng & Units 11/09/2020 11/08/2020 11/07/2020  Glucose 70 - 99 mg/dL - 268(H) 171(H)  BUN 8 - 23 mg/dL - 14 18  Creatinine 0.61 - 1.24 mg/dL - 1.42(H) 1.45(H)  Sodium 135 - 145 mmol/L - 136 135  Potassium 3.5 - 5.1 mmol/L - 3.5 3.6  Chloride 98 - 111 mmol/L - 106 105  CO2 22 - 32 mmol/L - 22 24  Calcium 8.9 - 10.3 mg/dL - 8.7(L) 8.6(L)  Total Protein 6.5 - 8.1 g/dL 6.1(L) 6.1(L) -  Total Bilirubin 0.3 - 1.2 mg/dL 1.1 1.1 -  Alkaline Phos 38 - 126 U/L 543(H) 508(H) -  AST 15 - 41 U/L 79(H) 111(H) -  ALT 0 - 44 U/L 166(H) 198(H) -    Hepatic Function Latest Ref Rng & Units 11/09/2020 11/08/2020 11/07/2020  Total Protein 6.5 - 8.1 g/dL 6.1(L) 6.1(L) 6.1(L)  Albumin 3.5 - 5.0 g/dL 2.7(L) 2.7(L) 2.8(L)  AST 15 - 41 U/L 79(H) 111(H) 434(H)  ALT 0 - 44 U/L 166(H) 198(H) 281(H)  Alk Phosphatase 38 - 126 U/L 543(H) 508(H) 596(H)  Total Bilirubin 0.3 - 1.2 mg/dL 1.1 1.1 1.7(H)  Bilirubin, Direct 0.0 - 0.2 mg/dL 0.4(H) - -    Assessment:  #1.  Bile duct stricture secondary to small pancreatic mass concerning for malignancy.  He will undergo ERCP with brush cytology stenting and will arrange for outpatient EUS.  #2.  Mild anemia possibly due to chronic disease.  #3.  Diabetes mellitus.  Patient presented with DKA.  DKA has resolved with treatment.  He is on short acting insulin with meals and Lantus.  #4.  Perianal abscess.  It ruptured spontaneously.  Drainage is stopped.  He is on vancomycin and ceftriaxone.   Plan:  Proceed with ERCP with brush cytology and biliary stenting.  I would place plastic stent. Procedure risks reviewed with patient in detail and he is agreeable.

## 2020-11-10 ENCOUNTER — Telehealth: Payer: Self-pay | Admitting: Gastroenterology

## 2020-11-10 LAB — GLUCOSE, CAPILLARY
Glucose-Capillary: 295 mg/dL — ABNORMAL HIGH (ref 70–99)
Glucose-Capillary: 286 mg/dL — ABNORMAL HIGH (ref 70–99)
Glucose-Capillary: 278 mg/dL — ABNORMAL HIGH (ref 70–99)

## 2020-11-10 LAB — HEPATIC FUNCTION PANEL
ALT: 110 U/L — ABNORMAL HIGH (ref 0–44)
AST: 26 U/L (ref 15–41)
Albumin: 2.6 g/dL — ABNORMAL LOW (ref 3.5–5.0)
Alkaline Phosphatase: 446 U/L — ABNORMAL HIGH (ref 38–126)
Bilirubin, Direct: 0.2 mg/dL (ref 0.0–0.2)
Indirect Bilirubin: 0.7 mg/dL (ref 0.3–0.9)
Total Bilirubin: 0.9 mg/dL (ref 0.3–1.2)
Total Protein: 5.9 g/dL — ABNORMAL LOW (ref 6.5–8.1)

## 2020-11-10 LAB — CREATININE, SERUM
Creatinine, Ser: 1.39 mg/dL — ABNORMAL HIGH (ref 0.61–1.24)
GFR, Estimated: 57 mL/min — ABNORMAL LOW (ref >60)

## 2020-11-10 MED ORDER — AMOXICILLIN-POT CLAVULANATE 875-125 MG PO TABS: 1.0000 | ORAL_TABLET | Freq: Two times a day (BID) | ORAL | 0 refills | Status: AC

## 2020-11-10 MED ORDER — DOXYCYCLINE HYCLATE 100 MG PO CAPS: 100.0000 mg | ORAL_CAPSULE | Freq: Two times a day (BID) | ORAL | 0 refills | Status: AC

## 2020-11-10 MED ORDER — INSULIN DETEMIR 100 UNIT/ML FLEXPEN
30.0000 [IU] | PEN_INJECTOR | Freq: Every day | SUBCUTANEOUS | 0 refills | Status: DC
Start: 2020-11-10 — End: 2020-11-13

## 2020-11-10 MED ORDER — LEVOTHYROXINE SODIUM 175 MCG PO TABS: 175.0000 ug | ORAL_TABLET | Freq: Every day | ORAL | Status: DC

## 2020-11-10 MED ORDER — INSULIN LISPRO (1 UNIT DIAL) 100 UNIT/ML (KWIKPEN): 10.0000 [IU] | PEN_INJECTOR | Freq: Three times a day (TID) | SUBCUTANEOUS | 3 refills | Status: DC

## 2020-11-10 NOTE — Progress Notes (Addendum)
Subjective: Patient doing well this morning with no complaints. He denies abdominal pain, nausea, vomiting, melena, or BRBPR.   Objective: Vital signs in last 24 hours: Temp:  [98.1 F (36.7 C)-98.4 F (36.9 C)] 98.1 F (36.7 C) (06/24 0500) Pulse Rate:  [72-81] 76 (06/24 0500) Resp:  [12-18] 18 (06/24 0500) BP: (124-146)/(67-82) 141/70 (06/24 0500) SpO2:  [96 %-100 %] 97 % (06/24 0500) Last BM Date: 11/05/20 General:   Alert and oriented, pleasant Heart:  S1, S2 present, no murmurs noted.  Lungs: Clear to auscultation bilaterally, without wheezing, rales, or rhonchi.  Abdomen:  Bowel sounds present, soft, non-tender, non-distended. No HSM or hernias noted. No rebound or guarding. No masses appreciated  Msk:  Symmetrical without gross deformities. Normal posture. Pulses:  Normal pulses noted. Extremities:  Without clubbing or edema. Neurologic:  Alert and  oriented x4;  grossly normal neurologically. Skin:  Warm and dry, intact without significant lesions.  Cervical Nodes:  No significant cervical adenopathy. Psych:  Alert and cooperative. Normal mood and affect.  Intake/Output from previous day: 06/23 0701 - 06/24 0700 In: 3216.6 [P.O.:480; I.V.:2036.6; IV Piggyback:700] Out: -  Intake/Output this shift: No intake/output data recorded.  Lab Results: Recent Labs    11/08/20 0456  WBC 5.0  HGB 11.7*  HCT 34.4*  PLT 400   BMET Recent Labs    11/07/20 1027 11/08/20 0456 11/10/20 0619  NA 135 136  --   K 3.6 3.5  --   CL 105 106  --   CO2 24 22  --   GLUCOSE 171* 268*  --   BUN 18 14  --   CREATININE 1.45* 1.42* 1.39*  CALCIUM 8.6* 8.7*  --    LFT Recent Labs    11/08/20 0456 11/09/20 0501 11/10/20 0619  PROT 6.1* 6.1* 5.9*  ALBUMIN 2.7* 2.7* 2.6*  AST 111* 79* 26  ALT 198* 166* 110*  ALKPHOS 508* 543* 446*  BILITOT 1.1 1.1 0.9  BILIDIR  --  0.4* 0.2  IBILI  --  0.7 0.7   Studies/Results: MR ABDOMEN MRCP WO CONTRAST  Result Date:  11/08/2020 CLINICAL DATA:  Cholelithiasis, evaluate for choledocholithiasis EXAM: MRI ABDOMEN WITHOUT CONTRAST  (INCLUDING MRCP) TECHNIQUE: Multiplanar multisequence MR imaging of the abdomen was performed. Heavily T2-weighted images of the biliary and pancreatic ducts were obtained, and three-dimensional MRCP images were rendered by post processing. COMPARISON:  None. FINDINGS: Lower chest: No acute findings. Hepatobiliary: No mass or other parenchymal abnormality identified. Intra and extrahepatic biliary ductal dilatation, with relatively abrupt truncation of the central common bile duct just proximal to the ampulla. The central common bile duct measures up to 1.1 cm in caliber. Distended gallbladder. Pancreas: There is diffuse pancreatic ductal dilatation in the superior pancreatic head and distally, measuring up to 6 mm in caliber. There is a somewhat ill-defined, T1 and T2 isointense mass of the central pancreatic head and uncinate measuring approximately 2.3 x 2.0 cm (series 4, image 23, series 5, image 15). Spleen:  Within normal limits in size and appearance. Adrenals/Urinary Tract: No masses identified. No evidence of hydronephrosis. Stomach/Bowel: Visualized portions within the abdomen are unremarkable. Vascular/Lymphatic: No pathologically enlarged lymph nodes identified. No abdominal aortic aneurysm demonstrated. Other:  None. Musculoskeletal: No suspicious bone lesions identified. IMPRESSION: 1. There is a somewhat ill-defined, T1 and T2 isointense mass of the central pancreatic head and uncinate measuring approximately 2.3 x 2.0 cm. 2. Intra and extrahepatic biliary ductal dilatation as well as a distended gallbladder,  with relatively abrupt truncation of the central common bile duct just proximal to the ampulla. 3. There is also diffuse pancreatic ductal dilatation in the superior pancreatic head and distally, with abrupt truncation in the pancreatic head. 4. Constellation of findings is highly  suspicious for pancreatic adenocarcinoma. There is no obvious evidence of abdominal metastatic disease on noncontrast MR. Electronically Signed   By: Eddie Candle M.D.   On: 11/08/2020 11:35   MR 3D Recon At Scanner  Result Date: 11/08/2020 CLINICAL DATA:  Cholelithiasis, evaluate for choledocholithiasis EXAM: MRI ABDOMEN WITHOUT CONTRAST  (INCLUDING MRCP) TECHNIQUE: Multiplanar multisequence MR imaging of the abdomen was performed. Heavily T2-weighted images of the biliary and pancreatic ducts were obtained, and three-dimensional MRCP images were rendered by post processing. COMPARISON:  None. FINDINGS: Lower chest: No acute findings. Hepatobiliary: No mass or other parenchymal abnormality identified. Intra and extrahepatic biliary ductal dilatation, with relatively abrupt truncation of the central common bile duct just proximal to the ampulla. The central common bile duct measures up to 1.1 cm in caliber. Distended gallbladder. Pancreas: There is diffuse pancreatic ductal dilatation in the superior pancreatic head and distally, measuring up to 6 mm in caliber. There is a somewhat ill-defined, T1 and T2 isointense mass of the central pancreatic head and uncinate measuring approximately 2.3 x 2.0 cm (series 4, image 23, series 5, image 15). Spleen:  Within normal limits in size and appearance. Adrenals/Urinary Tract: No masses identified. No evidence of hydronephrosis. Stomach/Bowel: Visualized portions within the abdomen are unremarkable. Vascular/Lymphatic: No pathologically enlarged lymph nodes identified. No abdominal aortic aneurysm demonstrated. Other:  None. Musculoskeletal: No suspicious bone lesions identified. IMPRESSION: 1. There is a somewhat ill-defined, T1 and T2 isointense mass of the central pancreatic head and uncinate measuring approximately 2.3 x 2.0 cm. 2. Intra and extrahepatic biliary ductal dilatation as well as a distended gallbladder, with relatively abrupt truncation of the central  common bile duct just proximal to the ampulla. 3. There is also diffuse pancreatic ductal dilatation in the superior pancreatic head and distally, with abrupt truncation in the pancreatic head. 4. Constellation of findings is highly suspicious for pancreatic adenocarcinoma. There is no obvious evidence of abdominal metastatic disease on noncontrast MR. Electronically Signed   By: Eddie Candle M.D.   On: 11/08/2020 11:35   NM Hepato W/EF  Result Date: 11/08/2020 CLINICAL DATA:  RIGHT upper quadrant pain, thickened gallbladder wall with sludge question cholecystitis EXAM: NUCLEAR MEDICINE HEPATOBILIARY IMAGING TECHNIQUE: Sequential images of the abdomen were obtained out to 60 minutes following intravenous administration of radiopharmaceutical. RADIOPHARMACEUTICALS:  4.8 mCi Tc-27m Choletec IV COMPARISON:  Ultrasound abdomen 11/07/2020, MRI/MRCP 11/08/2020 FINDINGS: Mildly delayed clearance of tracer from blood pool indicating component of hepatocellular dysfunction. Delayed excretion of tracer into biliary tree. At 1 hour, all of the tracer remains within the liver parenchyma. At 2 hours, all tracer remains within liver parenchyma, without visualization of the biliary tree. Delayed image at 4 hours reveals tracer within the small bowel indicating patency of the common bile duct. Additionally, tracer is not visualized within the gallbladder consistent with patent cystic duct. IMPRESSION: Hepatocellular dysfunction. Delayed visualization of tracer within the gallbladder, indicating presence of a patent cystic duct and an absence of acute cholecystitis; however, delayed visualization of the gallbladder can be seen in patients with chronic cholecystitis. Electronically Signed   By: MLavonia DanaM.D.   On: 11/08/2020 13:12   DG ERCP BILIARY & PANCREATIC DUCTS  Result Date: 11/09/2020 CLINICAL DATA:  Distal CBD  obstruction, pancreatic mass by MR EXAM: ERCP WITH SPHINCTEROTOMY AND STENTING TECHNIQUE: Multiple spot  images obtained with the fluoroscopic device and submitted for interpretation post-procedure. FLUOROSCOPY TIME:  Fluoroscopy Time: 0 minutes 43 seconds Radiation Exposure Index (if provided by the fluoroscopic device): 17.07 mGy Number of Acquired Spot Images: multiple fluoroscopic screen captures COMPARISON:  MR 11/08/2020, ultrasound abdomen 11/07/2020 FINDINGS: Images demonstrate presence of a short high-grade distal CBD stricture with abrupt tapering of CBD diameter similar to that seen on MR. No filling defects identified within the dilated CBD, which measures up to 14 mm diameter. Good drainage of contrast following stent placement. IMPRESSION: Short high-grade distal CBD stricture with dilated CBD up to 14 mm diameter. Good drainage of contrast following stent placement. These images were submitted for radiologic interpretation only. Please see the procedural report for the amount of contrast and the fluoroscopy time utilized. Electronically Signed   By: Lavonia Dana M.D.   On: 11/09/2020 14:17    Assessment/Plan:  Alexander Banks is a 65 year old male with suspected malignant Pancreatic Mass w/secondary bile duct stricture as well as mild anemia.  Patient Underwent ERCP with Dr. [name] on 11/09/20 w/brush cytology and stenting. Short, high-grade distal CBD stricture w/ dilated CBD up to 5m, plastic biliary stent placed with good drainage following placement.  LFTs continue to improve, ALk phos 446 down from 543, AST 26 down from 79 and ALT 110 down from 166. Total bilirubin remains stable at 0.9.   Hemoglobin 11.7, remains stable in 11-12 range since admission.    Patient stable from GI standpoint. No further recommendations at this time.     LOS: 3 days    11/10/2020, 8:34 AM  Chelsea L. CAlver Sorrow MSN, APRN, AGNP-C Adult-Gerontology Nurse Practitioner [facility]ake [facility]for [facility]   Addendum: patient seen in conjunction with CScherrie Gerlach MSN, APRN, AGNP-C. Agree with above. CA  19-9 in process. Transaminases and alk phos have improved following stent placement. Avoid aspirin and NSAIDs for 3 days. Patient to be discharged today. Will need EUS as outpatient. Will reach out to Dr. RLaural Goldenregarding ongoing outpatient care and plan.  AAnnitta Needs PhD, ANP-BC RBlue Ridge Surgical Center LLCGastroenterology

## 2020-11-10 NOTE — Discharge Summary (Signed)
Physician Discharge Summary  LUCIA MCCREADIE QQI:[id number] DOB: [birth year] DOA: 11/06/2020  PCP: [name], MD  Admit date: 11/06/2020 Discharge date: 11/10/2020  Admitted From:  HOME  Disposition:  HOME   Recommendations for Outpatient Follow-up:  Follow up with PCP in 1 weeks Follow up with Dr. [name] in 2 weeks to discuss results, EUS study if needed, and further referral if needed  Discharge Condition: STABLE   CODE STATUS: FULL  DIET: Carb modified diet    Brief Hospitalization Summary: Please see all hospital notes, images, labs for full details of the hospitalization. ADMISSION HPI: Alexander Banks is a 65 y.o. male with medical history significant for T2DM, hypertension, hyperlipidemia, hypothyroidism who presents to the emergency department for evaluation of elevated blood glucose level.  Patient complained of 3-week onset of increased thirst and urination and weight loss.  He also complained of 1 week onset of possible abscess in his buttock which was popped by wife at home with relief, he complained of subjective fever and purulent drainage for about 4 to 5 days and was seen at an urgent care on 11/03/2020 where patient was prescribed with Augmentin and Flagyl and advised on conservative treatment.  He was seen in the ED on 6/18 where patient was suspected to have early onset of DKA, acute kidney injury and abscess of buttock.  IV hydration was provided, insulin was given and patient was advised to follow-up with his PCP.  He followed with his PCP yesterday (6/20), lab work done showed elevated beta hydroxybutyrate level as well as increased anion gap with creatinine being above baseline level, his PCP consulted with an endocrinologist who recommends that patient should go to an ED since patient was still in DKA.  He denies nausea, vomiting, chest pain, shortness of breath   ED Course:  In the emergency department, he was hemodynamically stable.  Work-up in the ED showed  macrocytic anemia, hyponatremia, BUN to creatinine 25/1.66 (baseline creatinine at 1.1-1.2), beta hydroxybutyric acid 3.86> 1.89, CBG 476.  Influenza A, B, SARS coronavirus 2 was negative. CT pelvis with contrast showed  Right perirectal region edema extending to the medial right gluteal subcutaneus soft tissues with several associated foci of gas. No definite organized fluid collection. Please note necrotizing fasciitis cannot be excluded as this is a clinical diagnosis.  Patient was started on IV antibiotics (IV vancomycin, Zosyn and clindamycin), DKA protocol Endo tool was started, general surgery was consulted and will see patient in the morning per ED physician.   HOSPITAL COURSE BY PROBLEM  DKA - resolved now.  He was treated with IV insulin with good results.  He is now transitioned to basal bolus insulin.     Uncontrolled type 2 diabetes mellitus - remains poorly controlled and will need further outpatient follow up. We have increased prandial and basal coverage with increased CBG testing.  A1c was 12.8% which indicates very poorly controlled disease.  Increased lantus to 30 units, increased novolog to 10 units TIDAC.    Cellulitis / Gluteal abscess - discussed with Dr. Constance Haw, improving with spontaneous drainage, continue antibiotics and wound care as ordered.  Continue sitz baths.  He can discharge home today. Will DC on oral augmentin and doxycycline x 3 more days of coverage.     AKI - prerenal - resolved with IV fluid hydration.   Hyperlipidemia - continue statin therapy.      Bile duct stricture with small pancreatic mass - MRCP concerning for pancreatic cancer, GI taking him  for ERCP with brush cytology today.  He will need outpatient EUS. He will need outpatient follow up with surgery at tertiary care center.  Patient Underwent ERCP with Dr. [name] on 11/09/20 w/brush cytology and stenting. Short, high-grade distal CBD stricture w/ dilated CBD up to 45m, plastic biliary stent placed  with good drainage following placement.  Pt to follow up with Dr. RLaural Goldento discuss biopsy results and discuss if he needs referral for EUS. GI will also refer him to surgical oncology if needed based on biopsy results.  Ambulatory referral to oncology was made.     LFTs continue to improve, ALk phos 446 down from 543, AST 26 down from 79 and ALT 110 down from 166. Total bilirubin remains stable at 0.9.    Hypothyroidism - resumed home levothyroxine.     DVT prophylaxis: enoxaparin Code Status: full Family Communication: updated at bedside Disposition: home Status is: Inpatient   Remains inpatient appropriate because:Inpatient level of care appropriate due to severity of illness   Dispo: The patient is from: Home              Anticipated d/c is to: Home              Patient currently is not medically stable to d/c.              Difficult to place patient No   Consultants:  Surgery GI   Discharge Diagnoses:  Principal Problem:   D[place]di[place]acidosis) ([place] Active Problems:   Hyperlipidemia   Essential hypertension, benign   Hypothyroidism   Hyperglycemia due to diabetes mellitus ([place]   Perirectal abscess   Hyponatremia   Macrocytic anemia   AKI (acute kidney injury) ([place]   Dehydration   Cellulitis   Pancreatic mass   Chronic cholecystitis   Elevated LFTs   Discharge Instructions: Discharge Instructions     Ambulatory referral to Hematology / Oncology   Complete by: As directed       Allergies as of 11/10/2020       Reactions   Lisinopril    Cough        Medication List     STOP taking these medications    metroNIDAZOLE 500 MG tablet Commonly known as: FLAGYL       TAKE these medications    amoxicillin-clavulanate 875-125 MG tablet Commonly known as: AUGMENTIN Take 1 tablet by mouth every 12 (twelve) hours for 3 days.   doxycycline 100 MG capsule Commonly known as: VIBRAMYCIN Take 1 capsule (100 mg total) by mouth 2 (two) times daily  for 3 days.   Ferrous Fumarate 324 (106 Fe) MG Tabs tablet Commonly known as: HEMOCYTE - 106 mg FE Take 1 tablet by mouth daily.   insulin detemir 100 UNIT/ML FlexPen Commonly known as: LEVEMIR Inject 30 Units into the skin at bedtime. What changed:  how much to take how to take this when to take this additional instructions   insulin lispro 100 UNIT/ML KwikPen Commonly known as: HumaLOG KwikPen Inject 10 Units into the skin 3 (three) times daily with meals. 5 units with lunch and dinner as directed by physician What changed:  how much to take how to take this when to take this   levothyroxine 175 MCG tablet Commonly known as: SYNTHROID Take 1 tablet (175 mcg total) by mouth daily before breakfast.   OneTouch Delica Lancets 363KMisc TEST ONE TIME DAILY   OneTouch Verio test strip Generic drug: glucose blood TEST ONE TIME  DAILY   OSTEO ADVANCE PO Take 1 tablet by mouth daily.   rosuvastatin 40 MG tablet Commonly known as: CRESTOR TAKE 1 TABLET DAILY (STOP ATORVASTATIN) What changed:  how much to take when to take this additional instructions        Follow-up Information     Luking, Elayne Snare, MD. Schedule an appointment as soon as possible for a visit in 1 week(s).   Specialty: Family Medicine Why: Hospital Follow Up Contact information: [place] [address] [phone]         Rogene Houston, MD. Schedule an appointment as soon as possible for a visit in 2 week(s).   Specialty: Gastroenterology Why: Hospital Follow Up Contact information: [place], [address] [place] [address] [phone]                Allergies  Allergen Reactions   Lisinopril     Cough   Allergies as of 11/10/2020       Reactions   Lisinopril    Cough        Medication List     STOP taking these medications    metroNIDAZOLE 500 MG tablet Commonly known as: FLAGYL       TAKE these medications     amoxicillin-clavulanate 875-125 MG tablet Commonly known as: AUGMENTIN Take 1 tablet by mouth every 12 (twelve) hours for 3 days.   doxycycline 100 MG capsule Commonly known as: VIBRAMYCIN Take 1 capsule (100 mg total) by mouth 2 (two) times daily for 3 days.   Ferrous Fumarate 324 (106 Fe) MG Tabs tablet Commonly known as: HEMOCYTE - 106 mg FE Take 1 tablet by mouth daily.   insulin detemir 100 UNIT/ML FlexPen Commonly known as: LEVEMIR Inject 30 Units into the skin at bedtime. What changed:  how much to take how to take this when to take this additional instructions   insulin lispro 100 UNIT/ML KwikPen Commonly known as: HumaLOG KwikPen Inject 10 Units into the skin 3 (three) times daily with meals. 5 units with lunch and dinner as directed by physician What changed:  how much to take how to take this when to take this   levothyroxine 175 MCG tablet Commonly known as: SYNTHROID Take 1 tablet (175 mcg total) by mouth daily before breakfast.   OneTouch Delica Lancets 54M Misc TEST ONE TIME DAILY   OneTouch Verio test strip Generic drug: glucose blood TEST ONE TIME DAILY   OSTEO ADVANCE PO Take 1 tablet by mouth daily.   rosuvastatin 40 MG tablet Commonly known as: CRESTOR TAKE 1 TABLET DAILY (STOP ATORVASTATIN) What changed:  how much to take when to take this additional instructions        Procedures/Studies: CT PELVIS W CONTRAST  Result Date: 11/06/2020 CLINICAL DATA:  Anal or rectal abscess. Pt. Admitted for blood sugar problems. Pt. Has known rectal abscess. EXAM: CT PELVIS WITH CONTRAST TECHNIQUE: Multidetector CT imaging of the pelvis was performed using the standard protocol following the bolus administration of intravenous contrast. CONTRAST:  22m OMNIPAQUE IOHEXOL 300 MG/ML  SOLN COMPARISON:  None. FINDINGS: Urinary Tract:  No abnormality visualized. Bowel: Unremarkable visualized pelvic bowel loops. Ureteral jet noted within urinary bladder on  the right. Vascular/Lymphatic: Atherosclerotic plaque. No pathologically enlarged lymph nodes. No significant vascular abnormality seen. Reproductive: Prominent prostate. Otherwise no mass or other significant abnormality Other: No intraperitoneal free fluid. No intraperitoneal free gas. No organized fluid collection. Musculoskeletal: Medial right gluteal subcutaneus soft  tissue edema and several foci of gas with no definite organized fluid collection. Edema noted to extend from the right perirectal region. No CT findings of perineal or scrotal involvement. No suspicious bone lesions identified. IMPRESSION: 1. Right perirectal region edema extending to the medial right gluteal subcutaneus soft tissues with several associated foci of gas. No definite organized fluid collection. Please note necrotizing fasciitis cannot be excluded as this is a clinical diagnosis. 2. Prominent prostate. 3.  Aortic Atherosclerosis (ICD10-I70.0). Electronically Signed   By: Iven Finn M.D.   On: 11/06/2020 23:35   MR ABDOMEN MRCP WO CONTRAST  Result Date: 11/08/2020 CLINICAL DATA:  Cholelithiasis, evaluate for choledocholithiasis EXAM: MRI ABDOMEN WITHOUT CONTRAST  (INCLUDING MRCP) TECHNIQUE: Multiplanar multisequence MR imaging of the abdomen was performed. Heavily T2-weighted images of the biliary and pancreatic ducts were obtained, and three-dimensional MRCP images were rendered by post processing. COMPARISON:  None. FINDINGS: Lower chest: No acute findings. Hepatobiliary: No mass or other parenchymal abnormality identified. Intra and extrahepatic biliary ductal dilatation, with relatively abrupt truncation of the central common bile duct just proximal to the ampulla. The central common bile duct measures up to 1.1 cm in caliber. Distended gallbladder. Pancreas: There is diffuse pancreatic ductal dilatation in the superior pancreatic head and distally, measuring up to 6 mm in caliber. There is a somewhat ill-defined, T1 and  T2 isointense mass of the central pancreatic head and uncinate measuring approximately 2.3 x 2.0 cm (series 4, image 23, series 5, image 15). Spleen:  Within normal limits in size and appearance. Adrenals/Urinary Tract: No masses identified. No evidence of hydronephrosis. Stomach/Bowel: Visualized portions within the abdomen are unremarkable. Vascular/Lymphatic: No pathologically enlarged lymph nodes identified. No abdominal aortic aneurysm demonstrated. Other:  None. Musculoskeletal: No suspicious bone lesions identified. IMPRESSION: 1. There is a somewhat ill-defined, T1 and T2 isointense mass of the central pancreatic head and uncinate measuring approximately 2.3 x 2.0 cm. 2. Intra and extrahepatic biliary ductal dilatation as well as a distended gallbladder, with relatively abrupt truncation of the central common bile duct just proximal to the ampulla. 3. There is also diffuse pancreatic ductal dilatation in the superior pancreatic head and distally, with abrupt truncation in the pancreatic head. 4. Constellation of findings is highly suspicious for pancreatic adenocarcinoma. There is no obvious evidence of abdominal metastatic disease on noncontrast MR. Electronically Signed   By: Eddie Candle M.D.   On: 11/08/2020 11:35   MR 3D Recon At Scanner  Result Date: 11/08/2020 CLINICAL DATA:  Cholelithiasis, evaluate for choledocholithiasis EXAM: MRI ABDOMEN WITHOUT CONTRAST  (INCLUDING MRCP) TECHNIQUE: Multiplanar multisequence MR imaging of the abdomen was performed. Heavily T2-weighted images of the biliary and pancreatic ducts were obtained, and three-dimensional MRCP images were rendered by post processing. COMPARISON:  None. FINDINGS: Lower chest: No acute findings. Hepatobiliary: No mass or other parenchymal abnormality identified. Intra and extrahepatic biliary ductal dilatation, with relatively abrupt truncation of the central common bile duct just proximal to the ampulla. The central common bile duct  measures up to 1.1 cm in caliber. Distended gallbladder. Pancreas: There is diffuse pancreatic ductal dilatation in the superior pancreatic head and distally, measuring up to 6 mm in caliber. There is a somewhat ill-defined, T1 and T2 isointense mass of the central pancreatic head and uncinate measuring approximately 2.3 x 2.0 cm (series 4, image 23, series 5, image 15). Spleen:  Within normal limits in size and appearance. Adrenals/Urinary Tract: No masses identified. No evidence of hydronephrosis. Stomach/Bowel: Visualized portions within the abdomen  are unremarkable. Vascular/Lymphatic: No pathologically enlarged lymph nodes identified. No abdominal aortic aneurysm demonstrated. Other:  None. Musculoskeletal: No suspicious bone lesions identified. IMPRESSION: 1. There is a somewhat ill-defined, T1 and T2 isointense mass of the central pancreatic head and uncinate measuring approximately 2.3 x 2.0 cm. 2. Intra and extrahepatic biliary ductal dilatation as well as a distended gallbladder, with relatively abrupt truncation of the central common bile duct just proximal to the ampulla. 3. There is also diffuse pancreatic ductal dilatation in the superior pancreatic head and distally, with abrupt truncation in the pancreatic head. 4. Constellation of findings is highly suspicious for pancreatic adenocarcinoma. There is no obvious evidence of abdominal metastatic disease on noncontrast MR. Electronically Signed   By: Eddie Candle M.D.   On: 11/08/2020 11:35   NM Hepato W/EF  Result Date: 11/08/2020 CLINICAL DATA:  RIGHT upper quadrant pain, thickened gallbladder wall with sludge question cholecystitis EXAM: NUCLEAR MEDICINE HEPATOBILIARY IMAGING TECHNIQUE: Sequential images of the abdomen were obtained out to 60 minutes following intravenous administration of radiopharmaceutical. RADIOPHARMACEUTICALS:  4.8 mCi Tc-28m Choletec IV COMPARISON:  Ultrasound abdomen 11/07/2020, MRI/MRCP 11/08/2020 FINDINGS: Mildly  delayed clearance of tracer from blood pool indicating component of hepatocellular dysfunction. Delayed excretion of tracer into biliary tree. At 1 hour, all of the tracer remains within the liver parenchyma. At 2 hours, all tracer remains within liver parenchyma, without visualization of the biliary tree. Delayed image at 4 hours reveals tracer within the small bowel indicating patency of the common bile duct. Additionally, tracer is not visualized within the gallbladder consistent with patent cystic duct. IMPRESSION: Hepatocellular dysfunction. Delayed visualization of tracer within the gallbladder, indicating presence of a patent cystic duct and an absence of acute cholecystitis; however, delayed visualization of the gallbladder can be seen in patients with chronic cholecystitis. Electronically Signed   By: MLavonia DanaM.D.   On: 11/08/2020 13:12   DG ERCP BILIARY & PANCREATIC DUCTS  Result Date: 11/09/2020 CLINICAL DATA:  Distal CBD obstruction, pancreatic mass by MR EXAM: ERCP WITH SPHINCTEROTOMY AND STENTING TECHNIQUE: Multiple spot images obtained with the fluoroscopic device and submitted for interpretation post-procedure. FLUOROSCOPY TIME:  Fluoroscopy Time: 0 minutes 43 seconds Radiation Exposure Index (if provided by the fluoroscopic device): 17.07 mGy Number of Acquired Spot Images: multiple fluoroscopic screen captures COMPARISON:  MR 11/08/2020, ultrasound abdomen 11/07/2020 FINDINGS: Images demonstrate presence of a short high-grade distal CBD stricture with abrupt tapering of CBD diameter similar to that seen on MR. No filling defects identified within the dilated CBD, which measures up to 14 mm diameter. Good drainage of contrast following stent placement. IMPRESSION: Short high-grade distal CBD stricture with dilated CBD up to 14 mm diameter. Good drainage of contrast following stent placement. These images were submitted for radiologic interpretation only. Please see the procedural report for  the amount of contrast and the fluoroscopy time utilized. Electronically Signed   By: MLavonia DanaM.D.   On: 11/09/2020 14:17   [place]domen Limited RUQ (LIVER/GB)  Result Date: 11/07/2020 CLINICAL DATA:  Elevated LFTs. EXAM: ULTRASOUND ABDOMEN LIMITED RIGHT UPPER QUADRANT COMPARISON:  None. FINDINGS: Gallbladder: Gallbladder is distended with layering sludge. Gallbladder wall thickness upper normal to mildly increased at 3-4 mm. No appreciable pericholecystic fluid. The sonographer reports no sonographic Murphy sign. Common bile duct: Diameter: Dilated up to 7-10 mm diameter. Liver: Increased echogenicity suggests fatty deposition. Mild intrahepatic biliary duct dilatation evident. Portal vein is patent on color Doppler imaging with normal direction of blood flow towards  the liver. Other: None. IMPRESSION: 1. Distended gallbladder with borderline to mild gallbladder wall thickening. No gallstones evident by ultrasound. If there is clinical concern for cystic duct obstruction, nuclear scintigraphy may prove helpful to further evaluate. Given the biliary dilatation, MRI/MRCP may also prove helpful. 2. Probable hepatic steatosis. Electronically Signed   By: Misty Stanley M.D.   On: 11/07/2020 15:12     Subjective: Pt says he feels well and tolerated diet well with no problems.  He wants to go home today.  He says he will follow up with Dr. [name].    Discharge Exam: Vitals:   11/09/20 2048 11/10/20 0500  BP: (!) 146/79 (!) 141/70  Pulse: 81 76  Resp: 18 18  Temp: 98.4 F (36.9 C) 98.1 F (36.7 C)  SpO2: 96% 97%   Vitals:   11/09/20 1415 11/09/20 1430 11/09/20 2048 11/10/20 0500  BP: (!) 141/70 124/67 (!) 146/79 (!) 141/70  Pulse: 81 79 81 76  Resp: 13 12 18 18   Temp:   98.4 F (36.9 C) 98.1 F (36.7 C)  TempSrc:   Oral Oral  SpO2: 96% 99% 96% 97%  Weight:      Height:       General: Pt is alert, awake, not in acute distress Cardiovascular: normal S1/S2 +, no rubs, no  gallops Respiratory: CTA bilaterally, no wheezing, no rhonchi Abdominal: Soft, NT, ND, bowel sounds + Extremities: no edema, no cyanosis   The results of significant diagnostics from this hospitalization (including imaging, microbiology, ancillary and laboratory) are listed below for reference.     Microbiology: Recent Results (from the past 240 hour(s))  Resp Panel by RT-PCR (Flu A&B, Covid) Nasopharyngeal Swab     Status: None   Collection Time: 11/06/20 10:55 PM   Specimen: Nasopharyngeal Swab; Nasopharyngeal(NP) swabs in vial transport medium  Result Value Ref Range Status   SARS Coronavirus 2 by RT PCR NEGATIVE NEGATIVE Final    Comment: (NOTE) SARS-CoV-2 target nucleic acids are NOT DETECTED.  The SARS-CoV-2 RNA is generally detectable in upper respiratory specimens during the acute phase of infection. The lowest concentration of SARS-CoV-2 viral copies this assay can detect is 138 copies/mL. A negative result does not preclude SARS-Cov-2 infection and should not be used as the sole basis for treatment or other patient management decisions. A negative result may occur with  improper specimen collection/handling, submission of specimen other than nasopharyngeal swab, presence of viral mutation(s) within the areas targeted by this assay, and inadequate number of viral copies(<138 copies/mL). A negative result must be combined with clinical observations, patient history, and epidemiological information. The expected result is Negative.  Fact Sheet for Patients:  EntrepreneurPulse.com.au  Fact Sheet for Healthcare Providers:  IncredibleEmployment.be  This test is no t yet approved or cleared by the [place] FDA and  has been authorized for detection and/or diagnosis of SARS-CoV-2 by FDA under an Emergency Use Authorization (EUA). This EUA will remain  in effect (meaning this test can be used) for the duration of the COVID-19  declaration under Section 564(b)(1) of the Act, 21 U.S.C.section 360bbb-3(b)(1), unless the authorization is terminated  or revoked sooner.       Influenza A by PCR NEGATIVE NEGATIVE Final   Influenza B by PCR NEGATIVE NEGATIVE Final    Comment: (NOTE) The Xpert Xpress SARS-CoV-2/FLU/RSV plus assay is intended as an aid in the diagnosis of influenza from Nasopharyngeal swab specimens and should not be used as a sole basis for treatment. Nasal washings  and aspirates are unacceptable for Xpert Xpress SARS-CoV-2/FLU/RSV testing.  Fact Sheet for Patients: EntrepreneurPulse.com.au  Fact Sheet for Healthcare Providers: IncredibleEmployment.be  This test is not yet approved or cleared by the [place] FDA and has been authorized for detection and/or diagnosis of SARS-CoV-2 by FDA under an Emergency Use Authorization (EUA). This EUA will remain in effect (meaning this test can be used) for the duration of the COVID-19 declaration under Section 564(b)(1) of the Act, 21 U.S.C. section 360bbb-3(b)(1), unless the authorization is terminated or revoked.  Performed at [facility], [address] [place], [place] [address]   MRSA Next Gen by PCR, Nasal     Status: None   Collection Time: 11/07/20  1:41 AM   Specimen: Nasal Mucosa; Nasal Swab  Result Value Ref Range Status   MRSA by PCR Next Gen NOT DETECTED NOT DETECTED Final    Comment: (NOTE) The GeneXpert MRSA Assay (FDA approved for NASAL specimens only), is one component of a comprehensive MRSA colonization surveillance program. It is not intended to diagnose MRSA infection nor to guide or monitor treatment for MRSA infections. Test performance is not FDA approved in patients less than 44 years old. Performed at [facility], [address] [place], [place] [address]      Labs: BNP (last 3 results) No results for input(s): BNP in the last 8760 hours. Basic Metabolic Panel: Recent Labs   Lab 11/07/20 0329 11/07/20 0637 11/07/20 0702 11/07/20 1027 11/08/20 0456 11/10/20 0619  NA 134* 135 136 135 136  --   K 3.6 3.5 3.5 3.6 3.5  --   CL 100 105 105 105 106  --   CO2 22 23 23 24 22   --   GLUCOSE 148* 156* 151* 171* 268*  --   BUN 21 20 20 18 14   --   CREATININE 1.38* 1.42* 1.44* 1.45* 1.42* 1.39*  CALCIUM 9.0 8.8* 8.7* 8.6* 8.7*  --   MG  --  1.8  --   --  1.7  --   PHOS  --  2.4*  --   --   --   --    Liver Function Tests: Recent Labs  Lab 11/04/20 1223 11/07/20 0637 11/08/20 0456 11/09/20 0501 11/10/20 0619  AST 22 434* 111* 79* 26  ALT 58* 281* 198* 166* 110*  ALKPHOS 279* 596* 508* 543* 446*  BILITOT 1.1 1.7* 1.1 1.1 0.9  PROT 7.6 6.1* 6.1* 6.1* 5.9*  ALBUMIN 3.3* 2.8* 2.7* 2.7* 2.6*   No results for input(s): LIPASE, AMYLASE in the last 168 hours. No results for input(s): AMMONIA in the last 168 hours. CBC: Recent Labs  Lab 11/04/20 1223 11/06/20 1343 11/06/20 2033 11/07/20 0637 11/08/20 0456  WBC 8.4 6.8 5.4 5.6 5.0  NEUTROABS 6.6 4.9 3.8  --   --   HGB 12.6* 12.6* 12.2* 11.5* 11.7*  HCT 37.6* 38.6* 36.2* 34.3* 34.4*  MCV 98.9 101.0* 99.7 99.1 98.6  PLT 385 410* 378 396 400   Cardiac Enzymes: No results for input(s): CKTOTAL, CKMB, CKMBINDEX, TROPONINI in the last 168 hours. BNP: Invalid input(s): POCBNP CBG: Recent Labs  Lab 11/09/20 1203 11/09/20 1423 11/09/20 1628 11/10/20 0252 11/10/20 0747  GLUCAP 254* 292* 267* 295* 286*   D-Dimer No results for input(s): DDIMER in the last 72 hours. Hgb A1c No results for input(s): HGBA1C in the last 72 hours. Lipid Profile No results for input(s): CHOL, HDL, LDLCALC, TRIG, CHOLHDL, LDLDIRECT in the last 72 hours. Thyroid function studies  No results for input(s): TSH, T4TOTAL, T3FREE, THYROIDAB in the last 72 hours.  Invalid input(s): FREET3 Anemia work up No results for input(s): VITAMINB12, FOLATE, FERRITIN, TIBC, IRON, RETICCTPCT in the last 72 hours. Urinalysis    Component  Value Date/Time   COLORURINE YELLOW 11/06/2020 1902   APPEARANCEUR CLEAR 11/06/2020 1902   LABSPEC 1.027 11/06/2020 1902   PHURINE 5.0 11/06/2020 1902   GLUCOSEU >=500 (A) 11/06/2020 1902   HGBUR MODERATE (A) 11/06/2020 1902   BILIRUBINUR NEGATIVE 11/06/2020 1902   KETONESUR 20 (A) 11/06/2020 1902   PROTEINUR 30 (A) 11/06/2020 1902   NITRITE NEGATIVE 11/06/2020 1902   LEUKOCYTESUR NEGATIVE 11/06/2020 1902   Sepsis Labs Invalid input(s): PROCALCITONIN,  WBC,  LACTICIDVEN Microbiology Recent Results (from the past 240 hour(s))  Resp Panel by RT-PCR (Flu A&B, Covid) Nasopharyngeal Swab     Status: None   Collection Time: 11/06/20 10:55 PM   Specimen: Nasopharyngeal Swab; Nasopharyngeal(NP) swabs in vial transport medium  Result Value Ref Range Status   SARS Coronavirus 2 by RT PCR NEGATIVE NEGATIVE Final    Comment: (NOTE) SARS-CoV-2 target nucleic acids are NOT DETECTED.  The SARS-CoV-2 RNA is generally detectable in upper respiratory specimens during the acute phase of infection. The lowest concentration of SARS-CoV-2 viral copies this assay can detect is 138 copies/mL. A negative result does not preclude SARS-Cov-2 infection and should not be used as the sole basis for treatment or other patient management decisions. A negative result may occur with  improper specimen collection/handling, submission of specimen other than nasopharyngeal swab, presence of viral mutation(s) within the areas targeted by this assay, and inadequate number of viral copies(<138 copies/mL). A negative result must be combined with clinical observations, patient history, and epidemiological information. The expected result is Negative.  Fact Sheet for Patients:  EntrepreneurPulse.com.au  Fact Sheet for Healthcare Providers:  IncredibleEmployment.be  This test is no t yet approved or cleared by the [place] FDA and  has been authorized for detection and/or  diagnosis of SARS-CoV-2 by FDA under an Emergency Use Authorization (EUA). This EUA will remain  in effect (meaning this test can be used) for the duration of the COVID-19 declaration under Section 564(b)(1) of the Act, 21 U.S.C.section 360bbb-3(b)(1), unless the authorization is terminated  or revoked sooner.       Influenza A by PCR NEGATIVE NEGATIVE Final   Influenza B by PCR NEGATIVE NEGATIVE Final    Comment: (NOTE) The Xpert Xpress SARS-CoV-2/FLU/RSV plus assay is intended as an aid in the diagnosis of influenza from Nasopharyngeal swab specimens and should not be used as a sole basis for treatment. Nasal washings and aspirates are unacceptable for Xpert Xpress SARS-CoV-2/FLU/RSV testing.  Fact Sheet for Patients: EntrepreneurPulse.com.au  Fact Sheet for Healthcare Providers: IncredibleEmployment.be  This test is not yet approved or cleared by the [place] FDA and has been authorized for detection and/or diagnosis of SARS-CoV-2 by FDA under an Emergency Use Authorization (EUA). This EUA will remain in effect (meaning this test can be used) for the duration of the COVID-19 declaration under Section 564(b)(1) of the Act, 21 U.S.C. section 360bbb-3(b)(1), unless the authorization is terminated or revoked.  Performed at [facility], [address], [place], [place] [address]   MRSA Next Gen by PCR, Nasal     Status: None   Collection Time: 11/07/20  1:41 AM   Specimen: Nasal Mucosa; Nasal Swab  Result Value Ref Range Status   MRSA by PCR Next Gen NOT DETECTED NOT DETECTED  Final    Comment: (NOTE) The GeneXpert MRSA Assay (FDA approved for NASAL specimens only), is one component of a comprehensive MRSA colonization surveillance program. It is not intended to diagnose MRSA infection nor to guide or monitor treatment for MRSA infections. Test performance is not FDA approved in patients less than 46 years old. Performed at [facility], [address] [place], [place] [address]     Time coordinating discharge: 40 mins  SIGNED:  Irwin Brakeman, MD  [facility] 11/10/2020, 11:28 AM How to contact the [facility] Attending or Consulting provider [place] or covering provider during after hours [place], for this patient?  Check the care team in [facility] and look for a) attending/consulting [facility] provider listed and b) the [facility] team listed Log into [email or web address] and use [place]'s universal password to access. If you do not have the password, please contact the hospital operator. Locate the [facility] provider you are looking for under [facility] and page to a number that you can be directly reached. If you still have difficulty reaching the provider, please page the [facility] (Director on Call) for the Hospitalists listed on amion for assistance.

## 2020-11-10 NOTE — Discharge Instructions (Signed)
PLEASE FOLLOW UP WITH DR. [name] OFFICE IN 2 WEEKS TO DISCUSS FURTHER RECOMMENDATIONS FOR PANCREATIC MASS AND FURTHER REFERRAL FOR EUS IF NEEDED.    IMPORTANT INFORMATION: PAY CLOSE ATTENTION   PHYSICIAN DISCHARGE INSTRUCTIONS  Follow with Primary care provider  [name], MD  and other consultants as instructed by your Hospitalist Physician  [place] IF SYMPTOMS COME BACK, WORSEN OR NEW PROBLEM DEVELOPS   Please note: You were cared for by a hospitalist during your hospital stay. Every effort will be made to forward records to your primary care provider.  You can request that your primary care provider send for your hospital records if they have not received them.  Once you are discharged, your primary care physician will handle any further medical issues. Please note that NO REFILLS for any discharge medications will be authorized once you are discharged, as it is imperative that you return to your primary care physician (or establish a relationship with a primary care physician if you do not have one) for your post hospital discharge needs so that they can reassess your need for medications and monitor your lab values.  Please get a complete blood count and chemistry panel checked by your Primary MD at your next visit, and again as instructed by your Primary MD.  Get Medicines reviewed and adjusted: Please take all your medications with you for your next visit with your Primary MD  Laboratory/radiological data: Please request your Primary MD to go over all hospital tests and procedure/radiological results at the follow up, please ask your primary care provider to get all Hospital records sent to his/her office.  In some cases, they will be blood work, cultures and biopsy results pending at the time of your discharge. Please request that your primary care provider follow up on these results.  If you are diabetic, please bring your blood sugar  readings with you to your follow up appointment with primary care.    Please call and make your follow up appointments as soon as possible.    Also Note the following: If you experience worsening of your admission symptoms, develop shortness of breath, life threatening emergency, suicidal or homicidal thoughts you must seek medical attention immediately by calling 911 or calling your MD immediately  if symptoms less severe.  You must read complete instructions/literature along with all the possible adverse reactions/side effects for all the Medicines you take and that have been prescribed to you. Take any new Medicines after you have completely understood and accpet all the possible adverse reactions/side effects.   Do not drive when taking Pain medications or sleeping medications (Benzodiazepines)  Do not take more than prescribed Pain, Sleep and Anxiety Medications. It is not advisable to combine anxiety,sleep and pain medications without talking with your primary care practitioner  Special Instructions: If you have smoked or chewed Tobacco  in the last 2 yrs please stop smoking, stop any regular Alcohol  and or any Recreational drug use.  Wear Seat belts while driving.  Do not drive if taking any narcotic, mind altering or controlled substances or recreational drugs or alcohol.

## 2020-11-10 NOTE — Telephone Encounter (Signed)
Patient needs hospital follow up in 2-4 weeks.

## 2020-11-10 NOTE — Progress Notes (Signed)
Discharge instructions given pt and wife verbalized understnading. Discharged patient via wheelchair to private vehicle.

## 2020-11-11 LAB — GLUCOSE, CAPILLARY
Glucose-Capillary: 385 mg/dL — ABNORMAL HIGH (ref 70–99)
Glucose-Capillary: 258 mg/dL — ABNORMAL HIGH (ref 70–99)
Glucose-Capillary: 375 mg/dL — ABNORMAL HIGH (ref 70–99)

## 2020-11-11 LAB — CANCER ANTIGEN 19-9: CA 19-9: 367 U/mL — ABNORMAL HIGH (ref 0–35)

## 2020-11-13 ENCOUNTER — Telehealth: Payer: Self-pay | Admitting: Family Medicine

## 2020-11-13 ENCOUNTER — Other Ambulatory Visit: Payer: Self-pay

## 2020-11-13 ENCOUNTER — Encounter: Payer: Self-pay | Admitting: "Endocrinology

## 2020-11-13 ENCOUNTER — Ambulatory Visit (INDEPENDENT_AMBULATORY_CARE_PROVIDER_SITE_OTHER): Payer: BC Managed Care – PPO | Admitting: "Endocrinology

## 2020-11-13 VITALS — BP 82/54 | HR 88 | Ht 74.0 in | Wt 205.2 lb

## 2020-11-13 DIAGNOSIS — E1122 Type 2 diabetes mellitus with diabetic chronic kidney disease: Secondary | ICD-10-CM

## 2020-11-13 DIAGNOSIS — E039 Hypothyroidism, unspecified: Secondary | ICD-10-CM | POA: Diagnosis not present

## 2020-11-13 DIAGNOSIS — E782 Mixed hyperlipidemia: Secondary | ICD-10-CM | POA: Diagnosis not present

## 2020-11-13 DIAGNOSIS — E119 Type 2 diabetes mellitus without complications: Secondary | ICD-10-CM | POA: Insufficient documentation

## 2020-11-13 DIAGNOSIS — N1831 Chronic kidney disease, stage 3a: Secondary | ICD-10-CM

## 2020-11-13 DIAGNOSIS — Z794 Long term (current) use of insulin: Secondary | ICD-10-CM | POA: Insufficient documentation

## 2020-11-13 DIAGNOSIS — E1165 Type 2 diabetes mellitus with hyperglycemia: Secondary | ICD-10-CM | POA: Insufficient documentation

## 2020-11-13 MED ORDER — FREESTYLE LIBRE 2 READER DEVI
0 refills | Status: DC
Start: 2020-11-13 — End: 2021-10-10

## 2020-11-13 MED ORDER — INSULIN DETEMIR 100 UNIT/ML FLEXPEN: 40.0000 [IU] | PEN_INJECTOR | Freq: Every day | SUBCUTANEOUS | 1 refills | Status: DC

## 2020-11-13 MED ORDER — INSULIN LISPRO (1 UNIT DIAL) 100 UNIT/ML (KWIKPEN): 14.0000 [IU] | PEN_INJECTOR | Freq: Three times a day (TID) | SUBCUTANEOUS | 2 refills | Status: DC

## 2020-11-13 MED ORDER — FREESTYLE LIBRE 2 SENSOR MISC: 1.0000 | 3 refills | Status: DC

## 2020-11-13 NOTE — Patient Instructions (Signed)

## 2020-11-13 NOTE — Telephone Encounter (Signed)
Front-patient recently was in the hospital with perirectal abscess, pancreatic head growth, diabetes  Patient saw Dr. Dorris Fetch today  Needs to do a hospital follow-up visit.  Patient wanted to do follow-up visit Thursday or Friday please connect with patient for the following Options: We can do Thursday at 11:30 AM Or Friday at 11:30 AM or 4 PM Please see what patient would like to do go ahead and schedule thank you

## 2020-11-13 NOTE — Progress Notes (Signed)
Endocrinology Consult Note       11/13/2020, 10:53 AM   Subjective:    Patient ID: Alexander Banks, male    DOB: 05-26-1955.  Alexander Banks is being seen in consultation for management of currently uncontrolled symptomatic diabetes requested by  Alexander Drown, MD.   Past Medical History:  Diagnosis Date   Anemia, iron deficiency    Diabetes mellitus without complication ([place])    on meds   Hearing loss    Bil hearing aids   Hyperlipidemia    Hypertension    Prediabetes    Sleep apnea     Past Surgical History:  Procedure Laterality Date   COLONOSCOPY     GIVENS CAPSULE STUDY     KNEE SURGERY     4 surgeries on left, 2 on right knee   SHOULDER SURGERY     right shoulder    Social History   Socioeconomic History   Marital status: Married    Spouse name: Not on file   Number of children: Not on file   Years of education: Not on file   Highest education level: Not on file  Occupational History   Not on file  Tobacco Use   Smoking status: Every Day    Pack years: 0.00    Types: Cigars   Smokeless tobacco: Never   Tobacco comments:    Smoke 3-4 cigars a day  Vaping Use   Vaping Use: Never used  Substance and Sexual Activity   Alcohol use: Yes    Comment: occasional   Drug use: Never   Sexual activity: Not on file  Other Topics Concern   Not on file  Social History Narrative   Not on file   Social Determinants of Health   Financial Resource Strain: Not on file  Food Insecurity: Not on file  Transportation Needs: Not on file  Physical Activity: Not on file  Stress: Not on file  Social Connections: Not on file    Family History  Problem Relation Age of Onset   Heart attack Father    Heart attack Paternal Grandfather    Leukemia Sister    Heart attack Brother     Outpatient Encounter Medications as of 11/13/2020  Medication Sig   Continuous Blood Gluc Receiver  (FREESTYLE LIBRE 2 READER) DEVI As directed   Continuous Blood Gluc Sensor (FREESTYLE LIBRE 2 SENSOR) MISC 1 Piece by Does not apply route every 14 (fourteen) days.   amoxicillin-clavulanate (AUGMENTIN) 875-125 MG tablet Take 1 tablet by mouth every 12 (twelve) hours for 3 days.   doxycycline (VIBRAMYCIN) 100 MG capsule Take 1 capsule (100 mg total) by mouth 2 (two) times daily for 3 days.   Ferrous Fumarate (HEMOCYTE - 106 MG FE) 324 (106 Fe) MG TABS tablet Take 1 tablet by mouth daily.   glucose blood (ONETOUCH VERIO) test strip TEST ONE TIME DAILY   insulin detemir (LEVEMIR) 100 UNIT/ML FlexPen Inject 40 Units into the skin at bedtime.   insulin lispro (HUMALOG KWIKPEN) 100 UNIT/ML KwikPen Inject 14-20 Units into the skin 3 (three) times daily before meals. 5 units with  lunch and dinner as directed by physician   levothyroxine (SYNTHROID) 175 MCG tablet Take 1 tablet (175 mcg total) by mouth daily before breakfast.   Nutritional Supplements (OSTEO ADVANCE PO) Take 1 tablet by mouth daily.   OneTouch Delica Lancets 96V MISC TEST ONE TIME DAILY   rosuvastatin (CRESTOR) 40 MG tablet TAKE 1 TABLET DAILY (STOP ATORVASTATIN)   [DISCONTINUED] insulin detemir (LEVEMIR) 100 UNIT/ML FlexPen Inject 30 Units into the skin at bedtime. (Patient taking differently: Inject 34 Units into the skin at bedtime.)   [DISCONTINUED] insulin lispro (HUMALOG KWIKPEN) 100 UNIT/ML KwikPen Inject 10 Units into the skin 3 (three) times daily with meals. 5 units with lunch and dinner as directed by physician (Patient taking differently: Inject 12 Units into the skin 3 (three) times daily with meals. 5 units with lunch and dinner as directed by physician)   No facility-administered encounter medications on file as of 11/13/2020.    ALLERGIES: Allergies  Allergen Reactions   Lisinopril     Cough    VACCINATION STATUS: Immunization History  Administered Date(s) Administered   Influenza,inj,Quad PF,6+ Mos 04/15/2018,  04/09/2019   Pneumococcal Polysaccharide-23 08/10/2013   Td 06/18/2002, 11/16/2009    Diabetes He presents for his initial diabetic visit. He has type 2 diabetes mellitus. Onset time: He was diagnosed at approximate age of 65 years. Disease course: He was recently seen in emergency room 2 times  due to hyperglycemia complicated by DKA. There are no hypoglycemic associated symptoms. Pertinent negatives for hypoglycemia include no confusion, headaches, pallor or seizures. Associated symptoms include polydipsia and polyuria. Pertinent negatives for diabetes include no chest pain, no fatigue, no polyphagia and no weakness. There are no hypoglycemic complications. Symptoms are worsening. (He does not report gross complications from his diabetes.) Risk factors for coronary artery disease include dyslipidemia, diabetes mellitus, male sex, tobacco exposure and sedentary lifestyle. Current diabetic treatment includes insulin injections (He was recently switched to insulin treatment from oral medications.). His weight is fluctuating dramatically (He weighed as high as 285 pounds the past, progressively lost down to 220 pounds.  Most recently in the last 3 to 4 months, he has unintended weight loss down to 205 pounds today.). He is following a generally unhealthy diet. When asked about meal planning, he reported none. He has had a previous visit with a dietitian (He has seen a dietitian here in this office more than 3 years ago, lost contact.). He rarely participates in exercise. His breakfast blood glucose range is generally >200 mg/dl. His overall blood glucose range is >200 mg/dl. (He did have recent loss of control with A1c of 12.8% in June 2022.  His A1c has been between 6.1-7.3 between 2019-January of 20 22. He presents with a piece of paper with his fasting blood glucose ranging between 200-400 mg/dl.) An ACE inhibitor/angiotensin II receptor blocker is not being taken.  Hyperlipidemia This is a chronic  problem. The current episode started more than 1 year ago. Exacerbating diseases include diabetes and hypothyroidism. Pertinent negatives include no chest pain, myalgias or shortness of breath. Current antihyperlipidemic treatment includes statins. Risk factors for coronary artery disease include diabetes mellitus, dyslipidemia, a sedentary lifestyle and male sex.    Review of Systems  Constitutional:  Negative for chills, fatigue, fever and unexpected weight change.  HENT:  Negative for dental problem, mouth sores and trouble swallowing.   Eyes:  Negative for visual disturbance.  Respiratory:  Negative for cough, choking, chest tightness, shortness of breath and wheezing.  Cardiovascular:  Negative for chest pain, palpitations and leg swelling.  Gastrointestinal:  Negative for abdominal distention, abdominal pain, constipation, diarrhea, nausea and vomiting.  Endocrine: Positive for polydipsia and polyuria. Negative for polyphagia.  Genitourinary:  Negative for dysuria, flank pain, hematuria and urgency.  Musculoskeletal:  Negative for back pain, gait problem, myalgias and neck pain.  Skin:  Negative for pallor, rash and wound.  Neurological:  Negative for seizures, syncope, weakness, numbness and headaches.  Psychiatric/Behavioral:  Negative for confusion and dysphoric mood.    Objective:    Vitals with BMI 11/13/2020 11/10/2020 11/09/2020  Height 6\' 2"  - -  Weight 205 lbs 3 oz - -  BMI 10.62 - -  Systolic 82 694 854  Diastolic 54 70 79  Pulse 88 76 81    BP (!) 82/54   Pulse 88   Ht 6\' 2"  (1.88 m)   Wt 205 lb 3.2 oz (93.1 kg)   BMI 26.35 kg/m   Wt Readings from Last 3 Encounters:  11/13/20 205 lb 3.2 oz (93.1 kg)  11/07/20 204 lb 12.9 oz (92.9 kg)  11/06/20 202 lb 12.8 oz (92 kg)     Physical Exam Constitutional:      General: He is not in acute distress.    Appearance: He is well-developed.  HENT:     Head: Normocephalic and atraumatic.  Neck:     Thyroid: No  thyromegaly.     Trachea: No tracheal deviation.  Cardiovascular:     Rate and Rhythm: Normal rate.     Pulses:          Dorsalis pedis pulses are 1+ on the right side and 1+ on the left side.       Posterior tibial pulses are 1+ on the right side and 1+ on the left side.     Heart sounds: Normal heart sounds, S1 normal and S2 normal. No murmur heard.   No gallop.  Pulmonary:     Effort: Pulmonary effort is normal. No respiratory distress.     Breath sounds: Normal breath sounds. No wheezing.  Abdominal:     General: Abdomen is flat. There is no distension.     Tenderness: There is no abdominal tenderness. There is no guarding.  Musculoskeletal:     Right shoulder: No swelling or deformity.     Cervical back: Normal range of motion and neck supple.  Skin:    General: Skin is warm and dry.     Findings: No rash.     Nails: There is no clubbing.  Neurological:     Mental Status: He is alert and oriented to person, place, and time.     Cranial Nerves: No cranial nerve deficit.     Sensory: No sensory deficit.     Gait: Gait normal.     Deep Tendon Reflexes: Reflexes are normal and symmetric.  Psychiatric:        Speech: Speech normal.        Behavior: Behavior normal. Behavior is cooperative.        Thought Content: Thought content normal.        Judgment: Judgment normal.    CMP ( most recent) CMP     Component Value Date/Time   NA 136 11/08/2020 0456   NA 132 (L) 11/03/2020 0808   K 3.5 11/08/2020 0456   CL 106 11/08/2020 0456   CO2 22 11/08/2020 0456   GLUCOSE 268 (H) 11/08/2020 0456   BUN 14 11/08/2020 0456  BUN 38 (H) 11/03/2020 0808   CREATININE 1.39 (H) 11/10/2020 0619   CREATININE 1.10 05/31/2013 1020   CALCIUM 8.7 (L) 11/08/2020 0456   PROT 5.9 (L) 11/10/2020 0619   PROT 7.2 06/02/2020 0812   ALBUMIN 2.6 (L) 11/10/2020 0619   ALBUMIN 4.5 06/02/2020 0812   AST 26 11/10/2020 0619   ALT 110 (H) 11/10/2020 0619   ALKPHOS 446 (H) 11/10/2020 0619   BILITOT  0.9 11/10/2020 0619   BILITOT 0.4 06/02/2020 0812   GFRNONAA 57 (L) 11/10/2020 0619   GFRAA 87 06/02/2020 0812     Diabetic Labs (most recent): Lab Results  Component Value Date   HGBA1C 12.8 (H) 11/04/2020   HGBA1C 12.5 (H) 11/03/2020   HGBA1C 7.3 (H) 06/02/2020     Lipid Panel ( most recent) Lipid Panel     Component Value Date/Time   CHOL 117 06/02/2020 0812   TRIG 182 (H) 06/02/2020 0812   HDL 36 (L) 06/02/2020 0812   CHOLHDL 3.3 06/02/2020 0812   CHOLHDL 3.5 05/31/2013 1020   VLDL 23 05/31/2013 1020   LDLCALC 51 06/02/2020 0812   LABVLDL 30 06/02/2020 0812      Lab Results  Component Value Date   TSH 0.529 11/03/2020   TSH 0.438 (L) 06/02/2020   TSH 1.070 11/24/2019   TSH 5.940 (H) 04/05/2019   TSH 0.225 (L) 10/07/2018   TSH 1.210 04/08/2018   TSH 0.344 (L) 07/17/2017   TSH 0.608 12/09/2016   TSH 0.927 05/31/2016   TSH 1.830 11/28/2015   FREET4 1.65 12/09/2016   FREET4 0.92 11/28/2014      Assessment & Plan:   1. Type 2 diabetes mellitus with stage 3a chronic kidney disease, with long-term current use of insulin ([place])   - Alexander Banks has currently uncontrolled symptomatic type 2 DM since  65 years of age,  with most recent A1c of 12.8 %. Recent labs reviewed.   He did have recent loss of control with A1c of 12.8% in June 2022.  His A1c has been between 6.1-7.3% between 2019-January of 2022. He presents with a piece of paper with his fasting blood glucose ranging between 200-400 mg/dl.  - I had a long discussion with him about the progressive nature of diabetes and the pathology behind its complications. He will need further study to classify his diabetes properly.  He will have an Tylosin antibodies and antiglutamic acid decarboxylase antibodies measurements. -his diabetes is complicated by recent severe hyperglycemia, DKA and he remains at a high risk for more acute and chronic complications which include CAD, CVA, CKD, retinopathy, and  neuropathy. These are all discussed in detail with him.  - I have counseled him on diet  and weight management  by adopting a carbohydrate restricted/protein rich diet. Patient is encouraged to switch to  unprocessed or minimally processed     complex starch and increased protein intake (animal or plant source), fruits, and vegetables. -  he is advised to stick to a routine mealtimes to eat 3 meals  a day and avoid unnecessary snacks ( to snack only to correct hypoglycemia).   - he acknowledges that there is a room for improvement in his food and drink choices. - Suggestion is made for him to avoid simple carbohydrates  from his diet including Cakes, Sweet Desserts, Ice Cream, Soda (diet and regular), Sweet Tea, Candies, Chips, Cookies, Store Bought Juices, Alcohol in Excess of  1-2 drinks a day, Artificial Sweeteners,  Coffee Creamer, and "Sugar-free" Products.  This will help patient to have more stable blood glucose profile and potentially avoid unintended weight gain.  - he will be scheduled with Jearld Fenton, RDN, CDE for diabetes education.  He has seen her before, lost contact for the last 3 years.  - I have approached him with the following individualized plan to manage  his diabetes and patient agrees:   -In light of his severe hyperglycemia, he will continue to need intensive treatment with basal/bolus insulin for now.   He is advised to stay away from oral medications for diabetes for now. -I discussed and increase his Levemir to 40 units nightly, discussed and increase his Humalog to 14  units 3 times a day with meals  for pre-meal BG readings of 90-150mg /dl, plus patient specific correction dose for unexpected hyperglycemia above 150mg /dl, associated with strict monitoring of glucose 4 times a day-before meals and at bedtime. - he is warned not to take insulin without proper monitoring per orders. - Adjustment parameters are given to him for hypo and hyperglycemia in writing. - he is  encouraged to call clinic for blood glucose levels less than 70 or above 300 mg /dl. -He will be considered for his other options depending on his labs and presentation next visit in 7 weeks.  -This patient would benefit from a CGM.  I discussed and prescribed the freestyle libre device for him. - Specific targets for  A1c;  LDL, HDL,  and Triglycerides were discussed with the patient.  2) Blood Pressure /Hypertension:  his blood pressure is low this morning at 82/54.  He is not symptomatic at this time.  He is advised to report if he develops dizziness, lightheadedness.  He is not on antihypertensive medications.  I will request a.m. cortisol to evaluate for adrenal sufficiency before his next visit.   3) Lipids/Hyperlipidemia:   Review of his recent lipid panel showed uncontrolled triglyceride at 182, controlled LDL at 51.  He is currently on Crestor 40 mg p.o. daily.  He is advised to continue on same.    4)  Weight/Diet:  Body mass index is 26.35 kg/m.  -This patient weight as high as 285 on the past.  Over the years he lost weight, initially intentionally, later unintentionally.  He has history of moderate alcohol consumption for decades.  He is not suspect for malabsorption at this time.  He will need evaluation for adrenal insufficiency.  However, he is not a candidate for weight loss.   Exercise, and detailed carbohydrates information provided  -  detailed on discharge instructions.  5) hypothyroidism: Circumstance of diagnosis is not available.  He denies any surgery or thyroid ablation.  He has taken thyroid hormone for the last 2 years.  His current dose is levothyroxine to 175 mcg p.o. daily before breakfast.  His recent TSH was consistent with appropriate replacement at 0.529.  He will have TSH and free T4 before next visit.   - We discussed about the correct intake of his thyroid hormone, on empty stomach at fasting, with water, separated by at least 30 minutes from breakfast and other  medications,  and separated by more than 4 hours from calcium, iron, multivitamins, acid reflux medications (PPIs). -Patient is made aware of the fact that thyroid hormone replacement is needed for life, dose to be adjusted by periodic monitoring of thyroid function tests.   6) Chronic Care/Health Maintenance:  -he  is on Statin medications and  is encouraged to initiate and continue to follow up with  Ophthalmology, Dentist,  Podiatrist at least yearly or according to recommendations, and advised to  quit smoking. I have recommended yearly flu vaccine and pneumonia vaccine at least every 5 years; moderate intensity exercise for up to 150 minutes weekly; and  sleep for at least 7 hours a day.  - he is  advised to maintain close follow up with Alexander Drown, MD for primary care needs, as well as his other providers for optimal and coordinated care.   I spent 65 minutes in the care of the patient today including review of labs from [facility], Lipids, Thyroid Function, Hematology (current and previous including abstractions from other facilities); face-to-face time discussing  his blood glucose readings/logs, discussing hypoglycemia and hyperglycemia episodes and symptoms, medications doses, his options of short and long term treatment based on the latest standards of care / guidelines;  discussion about incorporating lifestyle medicine;  and documenting the encounter.     Please refer to Patient Instructions for Blood Glucose Monitoring and Insulin/Medications Dosing Guide"  in media tab for additional information. Please  also refer to " Patient Self Inventory" in the Media  tab for reviewed elements of pertinent patient history.  Alexander Banks participated in the discussions, expressed understanding, and voiced agreement with the above plans.  All questions were answered to his satisfaction. he is encouraged to contact clinic should he have any questions or concerns prior to his return  visit.   Follow up plan: - Return in about 7 weeks (around 01/01/2021) for F/U with Pre-visit Labs, Meter, Logs, A1c here.Glade Lloyd, MD [facility] [address] [place], [place] [address] Phone: [phone]  Fax: [phone]    11/13/2020, 10:53 AM  This note was partially dictated with voice recognition software. Similar sounding words can be transcribed inadequately or may not  be corrected upon review.

## 2020-11-14 ENCOUNTER — Telehealth: Payer: Self-pay

## 2020-11-14 DIAGNOSIS — E1122 Type 2 diabetes mellitus with diabetic chronic kidney disease: Secondary | ICD-10-CM | POA: Diagnosis not present

## 2020-11-14 LAB — CYTOLOGY - NON PAP

## 2020-11-14 NOTE — Telephone Encounter (Signed)
-----   Message from Milus Banister, MD sent at 11/14/2020  2:20 PM EDT ----- Regarding: RE: EUS The soonest I can do this July 14th, please add to the end of my currently scheduled cases for that day.  Thanks   ----- Message ----- From: Timothy Lasso, RN Sent: 11/14/2020   1:48 PM EDT To: Milus Banister, MD, Worthy Keeler, # Subject: [name]: EUS                                        Please advise  ----- Message ----- From: Worthy Keeler Sent: 11/14/2020   1:20 PM EDT To: Timothy Lasso, RN Subject: EUS                                             Patient needs pancreatic EUS for pancreatic mass with Dr. Rush Landmark or [name] ASAP. Thanks, Lelon Frohlich

## 2020-11-15 ENCOUNTER — Other Ambulatory Visit: Payer: Self-pay

## 2020-11-15 DIAGNOSIS — K8689 Other specified diseases of pancreas: Secondary | ICD-10-CM

## 2020-11-15 DIAGNOSIS — E1122 Type 2 diabetes mellitus with diabetic chronic kidney disease: Secondary | ICD-10-CM | POA: Diagnosis not present

## 2020-11-15 NOTE — Telephone Encounter (Signed)
EUS scheduled for 11/30/20 at 12 noon at [facility] with DJ.    EUS scheduled, pt instructed and medications reviewed.  Patient instructions mailed to home.  Patient to call with any questions or concerns.   Alexander Banks

## 2020-11-17 ENCOUNTER — Ambulatory Visit (INDEPENDENT_AMBULATORY_CARE_PROVIDER_SITE_OTHER): Payer: BC Managed Care – PPO | Admitting: Family Medicine

## 2020-11-17 ENCOUNTER — Ambulatory Visit (INDEPENDENT_AMBULATORY_CARE_PROVIDER_SITE_OTHER): Payer: BC Managed Care – PPO

## 2020-11-17 ENCOUNTER — Encounter (HOSPITAL_COMMUNITY): Payer: Self-pay | Admitting: Hematology and Oncology

## 2020-11-17 ENCOUNTER — Inpatient Hospital Stay (HOSPITAL_COMMUNITY): Payer: BC Managed Care – PPO | Attending: Hematology and Oncology | Admitting: Hematology and Oncology

## 2020-11-17 ENCOUNTER — Other Ambulatory Visit: Payer: Self-pay

## 2020-11-17 VITALS — BP 107/67 | HR 81 | Temp 98.2°F | Ht 74.5 in | Wt 204.0 lb

## 2020-11-17 VITALS — BP 113/63 | HR 87 | Temp 98.4°F | Ht 74.5 in | Wt 203.0 lb

## 2020-11-17 DIAGNOSIS — E1169 Type 2 diabetes mellitus with other specified complication: Secondary | ICD-10-CM

## 2020-11-17 DIAGNOSIS — D490 Neoplasm of unspecified behavior of digestive system: Secondary | ICD-10-CM | POA: Diagnosis not present

## 2020-11-17 DIAGNOSIS — Z806 Family history of leukemia: Secondary | ICD-10-CM | POA: Insufficient documentation

## 2020-11-17 DIAGNOSIS — E1165 Type 2 diabetes mellitus with hyperglycemia: Secondary | ICD-10-CM | POA: Insufficient documentation

## 2020-11-17 DIAGNOSIS — N529 Male erectile dysfunction, unspecified: Secondary | ICD-10-CM | POA: Insufficient documentation

## 2020-11-17 DIAGNOSIS — Z888 Allergy status to other drugs, medicaments and biological substances status: Secondary | ICD-10-CM | POA: Insufficient documentation

## 2020-11-17 DIAGNOSIS — I700 Atherosclerosis of aorta: Secondary | ICD-10-CM | POA: Diagnosis not present

## 2020-11-17 DIAGNOSIS — R21 Rash and other nonspecific skin eruption: Secondary | ICD-10-CM

## 2020-11-17 DIAGNOSIS — E039 Hypothyroidism, unspecified: Secondary | ICD-10-CM | POA: Diagnosis not present

## 2020-11-17 DIAGNOSIS — K611 Rectal abscess: Secondary | ICD-10-CM | POA: Insufficient documentation

## 2020-11-17 DIAGNOSIS — Z79899 Other long term (current) drug therapy: Secondary | ICD-10-CM | POA: Diagnosis not present

## 2020-11-17 DIAGNOSIS — Z794 Long term (current) use of insulin: Secondary | ICD-10-CM | POA: Insufficient documentation

## 2020-11-17 DIAGNOSIS — R7989 Other specified abnormal findings of blood chemistry: Secondary | ICD-10-CM | POA: Diagnosis not present

## 2020-11-17 DIAGNOSIS — I10 Essential (primary) hypertension: Secondary | ICD-10-CM | POA: Diagnosis not present

## 2020-11-17 DIAGNOSIS — Z8249 Family history of ischemic heart disease and other diseases of the circulatory system: Secondary | ICD-10-CM | POA: Diagnosis not present

## 2020-11-17 DIAGNOSIS — E785 Hyperlipidemia, unspecified: Secondary | ICD-10-CM

## 2020-11-17 DIAGNOSIS — K8689 Other specified diseases of pancreas: Secondary | ICD-10-CM | POA: Diagnosis not present

## 2020-11-17 DIAGNOSIS — K8020 Calculus of gallbladder without cholecystitis without obstruction: Secondary | ICD-10-CM | POA: Diagnosis not present

## 2020-11-17 DIAGNOSIS — K7689 Other specified diseases of liver: Secondary | ICD-10-CM | POA: Diagnosis not present

## 2020-11-17 DIAGNOSIS — E782 Mixed hyperlipidemia: Secondary | ICD-10-CM | POA: Insufficient documentation

## 2020-11-17 MED ORDER — DOXYCYCLINE HYCLATE 100 MG PO TABS: 100.0000 mg | ORAL_TABLET | Freq: Two times a day (BID) | ORAL | 0 refills | Status: DC

## 2020-11-17 MED ORDER — MOMETASONE FUROATE 0.1 % EX CREA: 1.0000 "application " | TOPICAL_CREAM | Freq: Two times a day (BID) | CUTANEOUS | 0 refills | Status: DC | PRN

## 2020-11-17 MED ORDER — SILDENAFIL CITRATE 100 MG PO TABS: 50.0000 mg | ORAL_TABLET | Freq: Every day | ORAL | 11 refills | Status: DC | PRN

## 2020-11-17 NOTE — Patient Instructions (Signed)
Contact office with any questions or concerns

## 2020-11-17 NOTE — Progress Notes (Signed)
[place] CONSULT NOTE  Patient Care Team: [name], MD as PCP - General (Family Medicine)  CHIEF COMPLAINTS/PURPOSE OF CONSULTATION:  Pancreatic mass  ASSESSMENT & PLAN:   This is a very pleasant 65 year old male patient with uncontrolled diabetes, perirectal abscess who was recently found to have Ill-defined T1 and T2 isointense mass of the central pancreatic head and uncinate measuring approximately 2.3 x 2 cm, some intra and extrahepatic biliary ductal dilation with abrupt truncation of the central CBD proximal to the ampulla there is also diffuse pancreatic ductal dilatation in the superior pancreatic head and distally with abnormal truncation of the pancreatic head, ERCP brushings negative for carcinoma referred to medical oncology for further recommendations.  Patient and wife were unhappy about the consultation to medical oncology since they do not even know the pathology yet.  They were hoping that he should be doing the EUS soon and once we have the diagnosis and think about the recommendations.  He appears to have some clinical symptoms concerning for pancreatic malignancy such as uncontrolled diabetes and unintentional weight loss.  We have discussed that it is highly suspicious for pancreatic malignancy however the staging is incomplete and we have to officially wait for the biopsy results.  He is scheduled for an EUS on July 14 and he wants to wait for the results before setting up any staging scans and to discuss any further treatment recommendations.  He could benefit from a presentation in GI tumor board once we have the pathology available to discuss if he is a candidate for surgery. He does have some comorbidities which are concerning such as uncontrolled diabetes mellitus and the perirectal abscess for which she is currently on antibiotics.  He wants to talk to his primary care physician about antibiotics. I have discussed with Dr. Wolfgang Phoenix about the plan of  care, advised Dr. Wolfgang Phoenix to reach out back to [place] once we have pathology available and if patient is ready to follow-up with [place] since he did not want to make an appointment today.  I did briefly discussed that if this were pancreatic adenocarcinoma, once we finish staging, we can consider surgery upfront if it is resectable versus neoadjuvant chemotherapy followed by consideration for surgery for the borderline resectable. Thank you for consulting [place] in the care of this patient.  Please do not hesitate to contact us with any additional questions or concerns.   HISTORY OF PRESENTING ILLNESS:   Alexander Banks 65 y.o. male is here because of pancreatic mass.  This is a very pleasant 65 year old male with past medical history significant for type 2 diabetes, hypertension, dyslipidemia, hypothyroidism who presented to the ED with hyperglycemia.  He was seen in the emergency room where he was suspected to have early onset of diabetic ketoacidosis and an abscess of his buttock.  He was sent back to emergency room because of concern for ongoing DKA.  He was found to have acute kidney impairment.  He also had CT pelvis to evaluate the buttock abscess which showed perirectal region edema extending to the medial right gluteal subcutaneous soft tissues with several foci of gas.  He was also found to have bile duct stricture with small pancreatic mass hence an MRCP was done.  MRCP showed somewhat ill-defined T1 and T2 isointense mass of the central pancreatic head and uncinate mass measuring approximately 2 x 2.3 cm.  There was intra and extrahepatic biliary ductal dilatation as well as a distended gallbladder with relatively abrupt truncation  of the central common bile duct proximal to the ampulla. This constellation of findings are highly suspicious for pancreatic adenocarcinoma.  No obvious evidence of abdominal metastatic disease.  He had ERCP which showed short high-grade distal CBD stricture and brushings were  negative for carcinoma. He was referred to medical oncology by his primary care physician.  He arrived today with his wife.  He was unhappy about the need to see a medical oncologist at this time since he does not even have the diagnosis established.  He is scheduled for an EUS procedure on July 15 and he was hoping to figure out things after that.  Wife was also pointing out several times that we do not know what the pathology is and she thinks this visit is a bit too early than needed.  He denies any pain right now.  He does admit to uncontrolled diabetes in the past several months.  His hemoglobin A1c back in January was 7 and most recently was 90.  Besides uncontrolled diabetes, he lost about 20 pounds of weight.  No family history of cancer.  Rest of the pertinent 10 point ROS reviewed and negative.  REVIEW OF SYSTEMS:   Constitutional: Denies fevers, chills or abnormal night sweats Eyes: Denies blurriness of vision, double vision or watery eyes Ears, nose, mouth, throat, and face: Denies mucositis or sore throat Respiratory: Denies cough, dyspnea or wheezes Cardiovascular: Denies palpitation, chest discomfort or lower extremity swelling Gastrointestinal:  Denies nausea, heartburn or change in bowel habits Skin: Denies abnormal skin rashes Lymphatics: Denies new lymphadenopathy or easy bruising Neurological:Denies numbness, tingling or new weaknesses Behavioral/Psych: Mood is stable, no new changes  All other systems were reviewed with the patient and are negative.  MEDICAL HISTORY:  Past Medical History:  Diagnosis Date   Anemia, iron deficiency    Diabetes mellitus without complication ([place])    on meds   Hearing loss    Bil hearing aids   Hyperlipidemia    Hypertension    Prediabetes    Sleep apnea     SURGICAL HISTORY: Past Surgical History:  Procedure Laterality Date   BILIARY STENT PLACEMENT N/A 11/09/2020   Procedure: BILIARY STENT PLACEMENT - 71 F X 5 CM STENT;   Surgeon: Rogene Houston, MD;  Location: [facility];  Service: Endoscopy;  Laterality: N/A;   COLONOSCOPY     ERCP N/A 11/09/2020   Procedure: ENDOSCOPIC RETROGRADE CHOLANGIOPANCREATOGRAPHY (ERCP);  Surgeon: Rogene Houston, MD;  Location: [facility];  Service: Endoscopy;  Laterality: N/A;   GIVENS CAPSULE STUDY     KNEE SURGERY     4 surgeries on left, 2 on right knee   SHOULDER SURGERY     right shoulder   SPHINCTEROTOMY N/A 11/09/2020   Procedure: SHORT BILIARY SPHINCTEROTOMY;  Surgeon: Rogene Houston, MD;  Location: [facility];  Service: Endoscopy;  Laterality: N/A;    SOCIAL HISTORY: Social History   Socioeconomic History   Marital status: Married    Spouse name: Not on file   Number of children: Not on file   Years of education: Not on file   Highest education level: Not on file  Occupational History   Not on file  Tobacco Use   Smoking status: Every Day    Pack years: 0.00    Types: Cigars   Smokeless tobacco: Never   Tobacco comments:    Smoke 3-4 cigars a day  Vaping Use   Vaping Use: Never used  Substance and Sexual Activity  Alcohol use: Yes    Comment: occasional   Drug use: Never   Sexual activity: Not on file  Other Topics Concern   Not on file  Social History Narrative   Not on file   Social Determinants of Health   Financial Resource Strain: Not on file  Food Insecurity: Not on file  Transportation Needs: Not on file  Physical Activity: Inactive   Days of Exercise per Week: 0 days   Minutes of Exercise per Session: 0 min  Stress: Not on file  Social Connections: Not on file  Intimate Partner Violence: Not At Risk   Fear of Current or Ex-Partner: No   Emotionally Abused: No   Physically Abused: No   Sexually Abused: No    FAMILY HISTORY: Family History  Problem Relation Age of Onset   Heart attack Father    Heart attack Paternal Grandfather    Leukemia Sister    Heart attack Brother     ALLERGIES:  is allergic to lisinopril.  MEDICATIONS:   Current Outpatient Medications  Medication Sig Dispense Refill   Continuous Blood Gluc Receiver (FREESTYLE LIBRE 2 READER) DEVI As directed 1 each 0   Continuous Blood Gluc Sensor (FREESTYLE LIBRE 2 SENSOR) MISC 1 Piece by Does not apply route every 14 (fourteen) days. 2 each 3   Ferrous Fumarate (HEMOCYTE - 106 MG FE) 324 (106 Fe) MG TABS tablet Take 1 tablet by mouth daily.     glucose blood (ONETOUCH VERIO) test strip TEST ONE TIME DAILY 100 each 3   insulin detemir (LEVEMIR) 100 UNIT/ML FlexPen Inject 40 Units into the skin at bedtime. 15 mL 1   insulin lispro (HUMALOG KWIKPEN) 100 UNIT/ML KwikPen Inject 14-20 Units into the skin 3 (three) times daily before meals. 5 units with lunch and dinner as directed by physician 15 mL 2   levothyroxine (SYNTHROID) 175 MCG tablet Take 1 tablet (175 mcg total) by mouth daily before breakfast.     Nutritional Supplements (OSTEO ADVANCE PO) Take 1 tablet by mouth daily.     OneTouch Delica Lancets 10X MISC TEST ONE TIME DAILY 100 each 11   rosuvastatin (CRESTOR) 40 MG tablet TAKE 1 TABLET DAILY (STOP ATORVASTATIN) 90 tablet 1   No current facility-administered medications for this visit.     PHYSICAL EXAMINATION:  ECOG PERFORMANCE STATUS: 0 - Asymptomatic  Vitals:   11/17/20 1007  BP: 113/63  Pulse: 87  Temp: 98.4 F (36.9 C)  SpO2: 98%   Filed Weights   11/17/20 1007  Weight: 203 lb (92.1 kg)   PE deferred in lieu of counseling  LABORATORY DATA:  I have reviewed the data as listed Lab Results  Component Value Date   WBC 5.0 11/08/2020   HGB 11.7 (L) 11/08/2020   HCT 34.4 (L) 11/08/2020   MCV 98.6 11/08/2020   PLT 400 11/08/2020     Chemistry      Component Value Date/Time   NA 136 11/08/2020 0456   NA 132 (L) 11/03/2020 0808   K 3.5 11/08/2020 0456   CL 106 11/08/2020 0456   CO2 22 11/08/2020 0456   BUN 14 11/08/2020 0456   BUN 38 (H) 11/03/2020 0808   CREATININE 1.39 (H) 11/10/2020 0619   CREATININE 1.10 05/31/2013  1020      Component Value Date/Time   CALCIUM 8.7 (L) 11/08/2020 0456   ALKPHOS 446 (H) 11/10/2020 0619   AST 26 11/10/2020 0619   ALT 110 (H) 11/10/2020 0619   BILITOT  0.9 11/10/2020 0619   BILITOT 0.4 06/02/2020 1610       RADIOGRAPHIC STUDIES: I have personally reviewed the radiological images as listed and agreed with the findings in the report. CT PELVIS W CONTRAST  Result Date: 11/06/2020 CLINICAL DATA:  Anal or rectal abscess. Pt. Admitted for blood sugar problems. Pt. Has known rectal abscess. EXAM: CT PELVIS WITH CONTRAST TECHNIQUE: Multidetector CT imaging of the pelvis was performed using the standard protocol following the bolus administration of intravenous contrast. CONTRAST:  98mL OMNIPAQUE IOHEXOL 300 MG/ML  SOLN COMPARISON:  None. FINDINGS: Urinary Tract:  No abnormality visualized. Bowel: Unremarkable visualized pelvic bowel loops. Ureteral jet noted within urinary bladder on the right. Vascular/Lymphatic: Atherosclerotic plaque. No pathologically enlarged lymph nodes. No significant vascular abnormality seen. Reproductive: Prominent prostate. Otherwise no mass or other significant abnormality Other: No intraperitoneal free fluid. No intraperitoneal free gas. No organized fluid collection. Musculoskeletal: Medial right gluteal subcutaneus soft tissue edema and several foci of gas with no definite organized fluid collection. Edema noted to extend from the right perirectal region. No CT findings of perineal or scrotal involvement. No suspicious bone lesions identified. IMPRESSION: 1. Right perirectal region edema extending to the medial right gluteal subcutaneus soft tissues with several associated foci of gas. No definite organized fluid collection. Please note necrotizing fasciitis cannot be excluded as this is a clinical diagnosis. 2. Prominent prostate. 3.  Aortic Atherosclerosis (ICD10-I70.0). Electronically Signed   By: Iven Finn M.D.   On: 11/06/2020 23:35   MR ABDOMEN  MRCP WO CONTRAST  Result Date: 11/08/2020 CLINICAL DATA:  Cholelithiasis, evaluate for choledocholithiasis EXAM: MRI ABDOMEN WITHOUT CONTRAST  (INCLUDING MRCP) TECHNIQUE: Multiplanar multisequence MR imaging of the abdomen was performed. Heavily T2-weighted images of the biliary and pancreatic ducts were obtained, and three-dimensional MRCP images were rendered by post processing. COMPARISON:  None. FINDINGS: Lower chest: No acute findings. Hepatobiliary: No mass or other parenchymal abnormality identified. Intra and extrahepatic biliary ductal dilatation, with relatively abrupt truncation of the central common bile duct just proximal to the ampulla. The central common bile duct measures up to 1.1 cm in caliber. Distended gallbladder. Pancreas: There is diffuse pancreatic ductal dilatation in the superior pancreatic head and distally, measuring up to 6 mm in caliber. There is a somewhat ill-defined, T1 and T2 isointense mass of the central pancreatic head and uncinate measuring approximately 2.3 x 2.0 cm (series 4, image 23, series 5, image 15). Spleen:  Within normal limits in size and appearance. Adrenals/Urinary Tract: No masses identified. No evidence of hydronephrosis. Stomach/Bowel: Visualized portions within the abdomen are unremarkable. Vascular/Lymphatic: No pathologically enlarged lymph nodes identified. No abdominal aortic aneurysm demonstrated. Other:  None. Musculoskeletal: No suspicious bone lesions identified. IMPRESSION: 1. There is a somewhat ill-defined, T1 and T2 isointense mass of the central pancreatic head and uncinate measuring approximately 2.3 x 2.0 cm. 2. Intra and extrahepatic biliary ductal dilatation as well as a distended gallbladder, with relatively abrupt truncation of the central common bile duct just proximal to the ampulla. 3. There is also diffuse pancreatic ductal dilatation in the superior pancreatic head and distally, with abrupt truncation in the pancreatic head. 4.  Constellation of findings is highly suspicious for pancreatic adenocarcinoma. There is no obvious evidence of abdominal metastatic disease on noncontrast MR. Electronically Signed   By: Eddie Candle M.D.   On: 11/08/2020 11:35   MR 3D Recon At Scanner  Result Date: 11/08/2020 CLINICAL DATA:  Cholelithiasis, evaluate for choledocholithiasis EXAM: MRI ABDOMEN WITHOUT CONTRAST  (  INCLUDING MRCP) TECHNIQUE: Multiplanar multisequence MR imaging of the abdomen was performed. Heavily T2-weighted images of the biliary and pancreatic ducts were obtained, and three-dimensional MRCP images were rendered by post processing. COMPARISON:  None. FINDINGS: Lower chest: No acute findings. Hepatobiliary: No mass or other parenchymal abnormality identified. Intra and extrahepatic biliary ductal dilatation, with relatively abrupt truncation of the central common bile duct just proximal to the ampulla. The central common bile duct measures up to 1.1 cm in caliber. Distended gallbladder. Pancreas: There is diffuse pancreatic ductal dilatation in the superior pancreatic head and distally, measuring up to 6 mm in caliber. There is a somewhat ill-defined, T1 and T2 isointense mass of the central pancreatic head and uncinate measuring approximately 2.3 x 2.0 cm (series 4, image 23, series 5, image 15). Spleen:  Within normal limits in size and appearance. Adrenals/Urinary Tract: No masses identified. No evidence of hydronephrosis. Stomach/Bowel: Visualized portions within the abdomen are unremarkable. Vascular/Lymphatic: No pathologically enlarged lymph nodes identified. No abdominal aortic aneurysm demonstrated. Other:  None. Musculoskeletal: No suspicious bone lesions identified. IMPRESSION: 1. There is a somewhat ill-defined, T1 and T2 isointense mass of the central pancreatic head and uncinate measuring approximately 2.3 x 2.0 cm. 2. Intra and extrahepatic biliary ductal dilatation as well as a distended gallbladder, with relatively  abrupt truncation of the central common bile duct just proximal to the ampulla. 3. There is also diffuse pancreatic ductal dilatation in the superior pancreatic head and distally, with abrupt truncation in the pancreatic head. 4. Constellation of findings is highly suspicious for pancreatic adenocarcinoma. There is no obvious evidence of abdominal metastatic disease on noncontrast MR. Electronically Signed   By: Eddie Candle M.D.   On: 11/08/2020 11:35   NM Hepato W/EF  Result Date: 11/08/2020 CLINICAL DATA:  RIGHT upper quadrant pain, thickened gallbladder wall with sludge question cholecystitis EXAM: NUCLEAR MEDICINE HEPATOBILIARY IMAGING TECHNIQUE: Sequential images of the abdomen were obtained out to 60 minutes following intravenous administration of radiopharmaceutical. RADIOPHARMACEUTICALS:  4.8 mCi Tc-34m  Choletec IV COMPARISON:  Ultrasound abdomen 11/07/2020, MRI/MRCP 11/08/2020 FINDINGS: Mildly delayed clearance of tracer from blood pool indicating component of hepatocellular dysfunction. Delayed excretion of tracer into biliary tree. At 1 hour, all of the tracer remains within the liver parenchyma. At 2 hours, all tracer remains within liver parenchyma, without visualization of the biliary tree. Delayed image at 4 hours reveals tracer within the small bowel indicating patency of the common bile duct. Additionally, tracer is not visualized within the gallbladder consistent with patent cystic duct. IMPRESSION: Hepatocellular dysfunction. Delayed visualization of tracer within the gallbladder, indicating presence of a patent cystic duct and an absence of acute cholecystitis; however, delayed visualization of the gallbladder can be seen in patients with chronic cholecystitis. Electronically Signed   By: Lavonia Dana M.D.   On: 11/08/2020 13:12   DG ERCP BILIARY & PANCREATIC DUCTS  Result Date: 11/09/2020 CLINICAL DATA:  Distal CBD obstruction, pancreatic mass by MR EXAM: ERCP WITH SPHINCTEROTOMY AND  STENTING TECHNIQUE: Multiple spot images obtained with the fluoroscopic device and submitted for interpretation post-procedure. FLUOROSCOPY TIME:  Fluoroscopy Time: 0 minutes 43 seconds Radiation Exposure Index (if provided by the fluoroscopic device): 17.07 mGy Number of Acquired Spot Images: multiple fluoroscopic screen captures COMPARISON:  MR 11/08/2020, ultrasound abdomen 11/07/2020 FINDINGS: Images demonstrate presence of a short high-grade distal CBD stricture with abrupt tapering of CBD diameter similar to that seen on MR. No filling defects identified within the dilated CBD, which measures up to 14  mm diameter. Good drainage of contrast following stent placement. IMPRESSION: Short high-grade distal CBD stricture with dilated CBD up to 14 mm diameter. Good drainage of contrast following stent placement. These images were submitted for radiologic interpretation only. Please see the procedural report for the amount of contrast and the fluoroscopy time utilized. Electronically Signed   By: Lavonia Dana M.D.   On: 11/09/2020 14:17   US Abdomen Limited RUQ (LIVER/GB)  Result Date: 11/07/2020 CLINICAL DATA:  Elevated LFTs. EXAM: ULTRASOUND ABDOMEN LIMITED RIGHT UPPER QUADRANT COMPARISON:  None. FINDINGS: Gallbladder: Gallbladder is distended with layering sludge. Gallbladder wall thickness upper normal to mildly increased at 3-4 mm. No appreciable pericholecystic fluid. The sonographer reports no sonographic Murphy sign. Common bile duct: Diameter: Dilated up to 7-10 mm diameter. Liver: Increased echogenicity suggests fatty deposition. Mild intrahepatic biliary duct dilatation evident. Portal vein is patent on color Doppler imaging with normal direction of blood flow towards the liver. Other: None. IMPRESSION: 1. Distended gallbladder with borderline to mild gallbladder wall thickening. No gallstones evident by ultrasound. If there is clinical concern for cystic duct obstruction, nuclear scintigraphy may prove  helpful to further evaluate. Given the biliary dilatation, MRI/MRCP may also prove helpful. 2. Probable hepatic steatosis. Electronically Signed   By: Misty Stanley M.D.   On: 11/07/2020 15:12    All questions were answered. The patient knows to call the clinic with any problems, questions or concerns. I spent 60 minutes in the care of this patient including H and P, review of records, counseling and coordination of care.     Benay Pike, MD 11/17/2020 10:20 AM

## 2020-11-17 NOTE — Progress Notes (Signed)
   Subjective:    Patient ID: Alexander Banks, male    DOB: 23-Jul-1955, 65 y.o.   MRN: [id number]  HPI  Hospitalization follow up  Patient has subpar control of diabetes Fasting sugars are still in the mid 100s into the 200 range Patient is trying to utilize insulin as directed he brings in his numbers Follows with endocrinology They will be seeing him every 4 to 8 weeks He also saw oncologist today and at this point time puts off CAT scan of chest until he knows the results of the biopsy May end up having to have a pancreatic resection Also here for follow-up regarding rectal abscess draining some but not severely denies rectal pain fever chills He also states he had onset of itching on his back that he states was due to a rash.  No jaundice.  Bowel movements have been normal color. Review of Systems     Objective:   Physical Exam Lungs clear heart regular Abdomen is soft no guarding rebound rectal shows perirectal abscess draining some but not a lot nontender       Assessment & Plan:  1. Rash Elocon cream apply twice daily over the course of the next couple weeks - doxycycline (VIBRA-TABS) 100 MG tablet; Take 1 tablet (100 mg total) by mouth 2 (two) times daily.  Dispense: 20 tablet; Refill: 0  2. Pancreatic tumor Patient will have ultrasound-guided biopsy in approximately 2 weeks if this does show pancreatic cancer we will expedite getting CT scan of the chest as well as progressing forward with patient having surgery hopefully  3. Hyperlipidemia associated with type 2 diabetes mellitus ([place]) Continue cholesterol medicine watch diet stay active  4. Poorly controlled type 2 diabetes mellitus ([facility]) Increase long-acting insulin currently on 40 units increase new dose 44 units send [place] readings next week may keep increasing 2 units - 4 units every 4 to 5 days until we see morning sugars between 100 and 130 Patient will continue sliding scale per endocrinology  5. Rectal  abscess Doxycycline as directed if it starts draining more or causing pain currently minimal drainage  6. Erectile dysfunction, unspecified erectile dysfunction type Viagra as directed  We will do follow-up CMP and CBC

## 2020-11-17 NOTE — H&P (View-Only) (Signed)
[facility] CONSULT NOTE  Patient Care Team: [name], MD as PCP - General (Family Medicine)  CHIEF COMPLAINTS/PURPOSE OF CONSULTATION:  Pancreatic mass  ASSESSMENT & PLAN:   This is a very pleasant 65 year old male patient with uncontrolled diabetes, perirectal abscess who was recently found to have Ill-defined T1 and T2 isointense mass of the central pancreatic head and uncinate measuring approximately 2.3 x 2 cm, some intra and extrahepatic biliary ductal dilation with abrupt truncation of the central CBD proximal to the ampulla there is also diffuse pancreatic ductal dilatation in the superior pancreatic head and distally with abnormal truncation of the pancreatic head, ERCP brushings negative for carcinoma referred to medical oncology for further recommendations.  Patient and wife were unhappy about the consultation to medical oncology since they do not even know the pathology yet.  They were hoping that he should be doing the EUS soon and once we have the diagnosis and think about the recommendations.  He appears to have some clinical symptoms concerning for pancreatic malignancy such as uncontrolled diabetes and unintentional weight loss.  We have discussed that it is highly suspicious for pancreatic malignancy however the staging is incomplete and we have to officially wait for the biopsy results.  He is scheduled for an EUS on July 14 and he wants to wait for the results before setting up any staging scans and to discuss any further treatment recommendations.  He could benefit from a presentation in GI tumor board once we have the pathology available to discuss if he is a candidate for surgery. He does have some comorbidities which are concerning such as uncontrolled diabetes mellitus and the perirectal abscess for which she is currently on antibiotics.  He wants to talk to his primary care physician about antibiotics. I have discussed with Dr. Wolfgang Phoenix about the plan of  care, advised Dr. Wolfgang Phoenix to reach out back to [place] once we have pathology available and if patient is ready to follow-up with [place] since he did not want to make an appointment today.  I did briefly discussed that if this were pancreatic adenocarcinoma, once we finish staging, we can consider surgery upfront if it is resectable versus neoadjuvant chemotherapy followed by consideration for surgery for the borderline resectable. Thank you for consulting [place] in the care of this patient.  Please do not hesitate to contact us with any additional questions or concerns.   HISTORY OF PRESENTING ILLNESS:   Alexander Banks 65 y.o. male is here because of pancreatic mass.  This is a very pleasant 65 year old male with past medical history significant for type 2 diabetes, hypertension, dyslipidemia, hypothyroidism who presented to the ED with hyperglycemia.  He was seen in the emergency room where he was suspected to have early onset of diabetic ketoacidosis and an abscess of his buttock.  He was sent back to emergency room because of concern for ongoing DKA.  He was found to have acute kidney impairment.  He also had CT pelvis to evaluate the buttock abscess which showed perirectal region edema extending to the medial right gluteal subcutaneous soft tissues with several foci of gas.  He was also found to have bile duct stricture with small pancreatic mass hence an MRCP was done.  MRCP showed somewhat ill-defined T1 and T2 isointense mass of the central pancreatic head and uncinate mass measuring approximately 2 x 2.3 cm.  There was intra and extrahepatic biliary ductal dilatation as well as a distended gallbladder with relatively abrupt truncation  of the central common bile duct proximal to the ampulla. This constellation of findings are highly suspicious for pancreatic adenocarcinoma.  No obvious evidence of abdominal metastatic disease.  He had ERCP which showed short high-grade distal CBD stricture and brushings were  negative for carcinoma. He was referred to medical oncology by his primary care physician.  He arrived today with his wife.  He was unhappy about the need to see a medical oncologist at this time since he does not even have the diagnosis established.  He is scheduled for an EUS procedure on July 15 and he was hoping to figure out things after that.  Wife was also pointing out several times that we do not know what the pathology is and she thinks this visit is a bit too early than needed.  He denies any pain right now.  He does admit to uncontrolled diabetes in the past several months.  His hemoglobin A1c back in January was 7 and most recently was 8.  Besides uncontrolled diabetes, he lost about 20 pounds of weight.  No family history of cancer.  Rest of the pertinent 10 point ROS reviewed and negative.  REVIEW OF SYSTEMS:   Constitutional: Denies fevers, chills or abnormal night sweats Eyes: Denies blurriness of vision, double vision or watery eyes Ears, nose, mouth, throat, and face: Denies mucositis or sore throat Respiratory: Denies cough, dyspnea or wheezes Cardiovascular: Denies palpitation, chest discomfort or lower extremity swelling Gastrointestinal:  Denies nausea, heartburn or change in bowel habits Skin: Denies abnormal skin rashes Lymphatics: Denies new lymphadenopathy or easy bruising Neurological:Denies numbness, tingling or new weaknesses Behavioral/Psych: Mood is stable, no new changes  All other systems were reviewed with the patient and are negative.  MEDICAL HISTORY:  Past Medical History:  Diagnosis Date   Anemia, iron deficiency    Diabetes mellitus without complication ([place])    on meds   Hearing loss    Bil hearing aids   Hyperlipidemia    Hypertension    Prediabetes    Sleep apnea     SURGICAL HISTORY: Past Surgical History:  Procedure Laterality Date   BILIARY STENT PLACEMENT N/A 11/09/2020   Procedure: BILIARY STENT PLACEMENT - 31 F X 5 CM STENT;   Surgeon: Rogene Houston, MD;  Location: [facility];  Service: Endoscopy;  Laterality: N/A;   COLONOSCOPY     ERCP N/A 11/09/2020   Procedure: ENDOSCOPIC RETROGRADE CHOLANGIOPANCREATOGRAPHY (ERCP);  Surgeon: Rogene Houston, MD;  Location: [facility];  Service: Endoscopy;  Laterality: N/A;   GIVENS CAPSULE STUDY     KNEE SURGERY     4 surgeries on left, 2 on right knee   SHOULDER SURGERY     right shoulder   SPHINCTEROTOMY N/A 11/09/2020   Procedure: SHORT BILIARY SPHINCTEROTOMY;  Surgeon: Rogene Houston, MD;  Location: [facility];  Service: Endoscopy;  Laterality: N/A;    SOCIAL HISTORY: Social History   Socioeconomic History   Marital status: Married    Spouse name: Not on file   Number of children: Not on file   Years of education: Not on file   Highest education level: Not on file  Occupational History   Not on file  Tobacco Use   Smoking status: Every Day    Pack years: 0.00    Types: Cigars   Smokeless tobacco: Never   Tobacco comments:    Smoke 3-4 cigars a day  Vaping Use   Vaping Use: Never used  Substance and Sexual Activity  Alcohol use: Yes    Comment: occasional   Drug use: Never   Sexual activity: Not on file  Other Topics Concern   Not on file  Social History Narrative   Not on file   Social Determinants of Health   Financial Resource Strain: Not on file  Food Insecurity: Not on file  Transportation Needs: Not on file  Physical Activity: Inactive   Days of Exercise per Week: 0 days   Minutes of Exercise per Session: 0 min  Stress: Not on file  Social Connections: Not on file  Intimate Partner Violence: Not At Risk   Fear of Current or Ex-Partner: No   Emotionally Abused: No   Physically Abused: No   Sexually Abused: No    FAMILY HISTORY: Family History  Problem Relation Age of Onset   Heart attack Father    Heart attack Paternal Grandfather    Leukemia Sister    Heart attack Brother     ALLERGIES:  is allergic to lisinopril.  MEDICATIONS:   Current Outpatient Medications  Medication Sig Dispense Refill   Continuous Blood Gluc Receiver (FREESTYLE LIBRE 2 READER) DEVI As directed 1 each 0   Continuous Blood Gluc Sensor (FREESTYLE LIBRE 2 SENSOR) MISC 1 Piece by Does not apply route every 14 (fourteen) days. 2 each 3   Ferrous Fumarate (HEMOCYTE - 106 MG FE) 324 (106 Fe) MG TABS tablet Take 1 tablet by mouth daily.     glucose blood (ONETOUCH VERIO) test strip TEST ONE TIME DAILY 100 each 3   insulin detemir (LEVEMIR) 100 UNIT/ML FlexPen Inject 40 Units into the skin at bedtime. 15 mL 1   insulin lispro (HUMALOG KWIKPEN) 100 UNIT/ML KwikPen Inject 14-20 Units into the skin 3 (three) times daily before meals. 5 units with lunch and dinner as directed by physician 15 mL 2   levothyroxine (SYNTHROID) 175 MCG tablet Take 1 tablet (175 mcg total) by mouth daily before breakfast.     Nutritional Supplements (OSTEO ADVANCE PO) Take 1 tablet by mouth daily.     OneTouch Delica Lancets 38G MISC TEST ONE TIME DAILY 100 each 11   rosuvastatin (CRESTOR) 40 MG tablet TAKE 1 TABLET DAILY (STOP ATORVASTATIN) 90 tablet 1   No current facility-administered medications for this visit.     PHYSICAL EXAMINATION:  ECOG PERFORMANCE STATUS: 0 - Asymptomatic  Vitals:   11/17/20 1007  BP: 113/63  Pulse: 87  Temp: 98.4 F (36.9 C)  SpO2: 98%   Filed Weights   11/17/20 1007  Weight: 203 lb (92.1 kg)   PE deferred in lieu of counseling  LABORATORY DATA:  I have reviewed the data as listed Lab Results  Component Value Date   WBC 5.0 11/08/2020   HGB 11.7 (L) 11/08/2020   HCT 34.4 (L) 11/08/2020   MCV 98.6 11/08/2020   PLT 400 11/08/2020     Chemistry      Component Value Date/Time   NA 136 11/08/2020 0456   NA 132 (L) 11/03/2020 0808   K 3.5 11/08/2020 0456   CL 106 11/08/2020 0456   CO2 22 11/08/2020 0456   BUN 14 11/08/2020 0456   BUN 38 (H) 11/03/2020 0808   CREATININE 1.39 (H) 11/10/2020 0619   CREATININE 1.10 05/31/2013  1020      Component Value Date/Time   CALCIUM 8.7 (L) 11/08/2020 0456   ALKPHOS 446 (H) 11/10/2020 0619   AST 26 11/10/2020 0619   ALT 110 (H) 11/10/2020 0619   BILITOT  0.9 11/10/2020 0619   BILITOT 0.4 06/02/2020 7616       RADIOGRAPHIC STUDIES: I have personally reviewed the radiological images as listed and agreed with the findings in the report. CT PELVIS W CONTRAST  Result Date: 11/06/2020 CLINICAL DATA:  Anal or rectal abscess. Pt. Admitted for blood sugar problems. Pt. Has known rectal abscess. EXAM: CT PELVIS WITH CONTRAST TECHNIQUE: Multidetector CT imaging of the pelvis was performed using the standard protocol following the bolus administration of intravenous contrast. CONTRAST:  29mL OMNIPAQUE IOHEXOL 300 MG/ML  SOLN COMPARISON:  None. FINDINGS: Urinary Tract:  No abnormality visualized. Bowel: Unremarkable visualized pelvic bowel loops. Ureteral jet noted within urinary bladder on the right. Vascular/Lymphatic: Atherosclerotic plaque. No pathologically enlarged lymph nodes. No significant vascular abnormality seen. Reproductive: Prominent prostate. Otherwise no mass or other significant abnormality Other: No intraperitoneal free fluid. No intraperitoneal free gas. No organized fluid collection. Musculoskeletal: Medial right gluteal subcutaneus soft tissue edema and several foci of gas with no definite organized fluid collection. Edema noted to extend from the right perirectal region. No CT findings of perineal or scrotal involvement. No suspicious bone lesions identified. IMPRESSION: 1. Right perirectal region edema extending to the medial right gluteal subcutaneus soft tissues with several associated foci of gas. No definite organized fluid collection. Please note necrotizing fasciitis cannot be excluded as this is a clinical diagnosis. 2. Prominent prostate. 3.  Aortic Atherosclerosis (ICD10-I70.0). Electronically Signed   By: Iven Finn M.D.   On: 11/06/2020 23:35   MR ABDOMEN  MRCP WO CONTRAST  Result Date: 11/08/2020 CLINICAL DATA:  Cholelithiasis, evaluate for choledocholithiasis EXAM: MRI ABDOMEN WITHOUT CONTRAST  (INCLUDING MRCP) TECHNIQUE: Multiplanar multisequence MR imaging of the abdomen was performed. Heavily T2-weighted images of the biliary and pancreatic ducts were obtained, and three-dimensional MRCP images were rendered by post processing. COMPARISON:  None. FINDINGS: Lower chest: No acute findings. Hepatobiliary: No mass or other parenchymal abnormality identified. Intra and extrahepatic biliary ductal dilatation, with relatively abrupt truncation of the central common bile duct just proximal to the ampulla. The central common bile duct measures up to 1.1 cm in caliber. Distended gallbladder. Pancreas: There is diffuse pancreatic ductal dilatation in the superior pancreatic head and distally, measuring up to 6 mm in caliber. There is a somewhat ill-defined, T1 and T2 isointense mass of the central pancreatic head and uncinate measuring approximately 2.3 x 2.0 cm (series 4, image 23, series 5, image 15). Spleen:  Within normal limits in size and appearance. Adrenals/Urinary Tract: No masses identified. No evidence of hydronephrosis. Stomach/Bowel: Visualized portions within the abdomen are unremarkable. Vascular/Lymphatic: No pathologically enlarged lymph nodes identified. No abdominal aortic aneurysm demonstrated. Other:  None. Musculoskeletal: No suspicious bone lesions identified. IMPRESSION: 1. There is a somewhat ill-defined, T1 and T2 isointense mass of the central pancreatic head and uncinate measuring approximately 2.3 x 2.0 cm. 2. Intra and extrahepatic biliary ductal dilatation as well as a distended gallbladder, with relatively abrupt truncation of the central common bile duct just proximal to the ampulla. 3. There is also diffuse pancreatic ductal dilatation in the superior pancreatic head and distally, with abrupt truncation in the pancreatic head. 4.  Constellation of findings is highly suspicious for pancreatic adenocarcinoma. There is no obvious evidence of abdominal metastatic disease on noncontrast MR. Electronically Signed   By: Eddie Candle M.D.   On: 11/08/2020 11:35   MR 3D Recon At Scanner  Result Date: 11/08/2020 CLINICAL DATA:  Cholelithiasis, evaluate for choledocholithiasis EXAM: MRI ABDOMEN WITHOUT CONTRAST  (  INCLUDING MRCP) TECHNIQUE: Multiplanar multisequence MR imaging of the abdomen was performed. Heavily T2-weighted images of the biliary and pancreatic ducts were obtained, and three-dimensional MRCP images were rendered by post processing. COMPARISON:  None. FINDINGS: Lower chest: No acute findings. Hepatobiliary: No mass or other parenchymal abnormality identified. Intra and extrahepatic biliary ductal dilatation, with relatively abrupt truncation of the central common bile duct just proximal to the ampulla. The central common bile duct measures up to 1.1 cm in caliber. Distended gallbladder. Pancreas: There is diffuse pancreatic ductal dilatation in the superior pancreatic head and distally, measuring up to 6 mm in caliber. There is a somewhat ill-defined, T1 and T2 isointense mass of the central pancreatic head and uncinate measuring approximately 2.3 x 2.0 cm (series 4, image 23, series 5, image 15). Spleen:  Within normal limits in size and appearance. Adrenals/Urinary Tract: No masses identified. No evidence of hydronephrosis. Stomach/Bowel: Visualized portions within the abdomen are unremarkable. Vascular/Lymphatic: No pathologically enlarged lymph nodes identified. No abdominal aortic aneurysm demonstrated. Other:  None. Musculoskeletal: No suspicious bone lesions identified. IMPRESSION: 1. There is a somewhat ill-defined, T1 and T2 isointense mass of the central pancreatic head and uncinate measuring approximately 2.3 x 2.0 cm. 2. Intra and extrahepatic biliary ductal dilatation as well as a distended gallbladder, with relatively  abrupt truncation of the central common bile duct just proximal to the ampulla. 3. There is also diffuse pancreatic ductal dilatation in the superior pancreatic head and distally, with abrupt truncation in the pancreatic head. 4. Constellation of findings is highly suspicious for pancreatic adenocarcinoma. There is no obvious evidence of abdominal metastatic disease on noncontrast MR. Electronically Signed   By: Eddie Candle M.D.   On: 11/08/2020 11:35   NM Hepato W/EF  Result Date: 11/08/2020 CLINICAL DATA:  RIGHT upper quadrant pain, thickened gallbladder wall with sludge question cholecystitis EXAM: NUCLEAR MEDICINE HEPATOBILIARY IMAGING TECHNIQUE: Sequential images of the abdomen were obtained out to 60 minutes following intravenous administration of radiopharmaceutical. RADIOPHARMACEUTICALS:  4.8 mCi Tc-29m  Choletec IV COMPARISON:  Ultrasound abdomen 11/07/2020, MRI/MRCP 11/08/2020 FINDINGS: Mildly delayed clearance of tracer from blood pool indicating component of hepatocellular dysfunction. Delayed excretion of tracer into biliary tree. At 1 hour, all of the tracer remains within the liver parenchyma. At 2 hours, all tracer remains within liver parenchyma, without visualization of the biliary tree. Delayed image at 4 hours reveals tracer within the small bowel indicating patency of the common bile duct. Additionally, tracer is not visualized within the gallbladder consistent with patent cystic duct. IMPRESSION: Hepatocellular dysfunction. Delayed visualization of tracer within the gallbladder, indicating presence of a patent cystic duct and an absence of acute cholecystitis; however, delayed visualization of the gallbladder can be seen in patients with chronic cholecystitis. Electronically Signed   By: Lavonia Dana M.D.   On: 11/08/2020 13:12   DG ERCP BILIARY & PANCREATIC DUCTS  Result Date: 11/09/2020 CLINICAL DATA:  Distal CBD obstruction, pancreatic mass by MR EXAM: ERCP WITH SPHINCTEROTOMY AND  STENTING TECHNIQUE: Multiple spot images obtained with the fluoroscopic device and submitted for interpretation post-procedure. FLUOROSCOPY TIME:  Fluoroscopy Time: 0 minutes 43 seconds Radiation Exposure Index (if provided by the fluoroscopic device): 17.07 mGy Number of Acquired Spot Images: multiple fluoroscopic screen captures COMPARISON:  MR 11/08/2020, ultrasound abdomen 11/07/2020 FINDINGS: Images demonstrate presence of a short high-grade distal CBD stricture with abrupt tapering of CBD diameter similar to that seen on MR. No filling defects identified within the dilated CBD, which measures up to 14  mm diameter. Good drainage of contrast following stent placement. IMPRESSION: Short high-grade distal CBD stricture with dilated CBD up to 14 mm diameter. Good drainage of contrast following stent placement. These images were submitted for radiologic interpretation only. Please see the procedural report for the amount of contrast and the fluoroscopy time utilized. Electronically Signed   By: Lavonia Dana M.D.   On: 11/09/2020 14:17   US Abdomen Limited RUQ (LIVER/GB)  Result Date: 11/07/2020 CLINICAL DATA:  Elevated LFTs. EXAM: ULTRASOUND ABDOMEN LIMITED RIGHT UPPER QUADRANT COMPARISON:  None. FINDINGS: Gallbladder: Gallbladder is distended with layering sludge. Gallbladder wall thickness upper normal to mildly increased at 3-4 mm. No appreciable pericholecystic fluid. The sonographer reports no sonographic Murphy sign. Common bile duct: Diameter: Dilated up to 7-10 mm diameter. Liver: Increased echogenicity suggests fatty deposition. Mild intrahepatic biliary duct dilatation evident. Portal vein is patent on color Doppler imaging with normal direction of blood flow towards the liver. Other: None. IMPRESSION: 1. Distended gallbladder with borderline to mild gallbladder wall thickening. No gallstones evident by ultrasound. If there is clinical concern for cystic duct obstruction, nuclear scintigraphy may prove  helpful to further evaluate. Given the biliary dilatation, MRI/MRCP may also prove helpful. 2. Probable hepatic steatosis. Electronically Signed   By: Misty Stanley M.D.   On: 11/07/2020 15:12    All questions were answered. The patient knows to call the clinic with any problems, questions or concerns. I spent 60 minutes in the care of this patient including H and P, review of records, counseling and coordination of care.     Benay Pike, MD 11/17/2020 10:20 AM

## 2020-11-17 NOTE — Progress Notes (Signed)
Pt brings in his [facility] today. He has the [place] 2 reader and sensors. Went over instructions on how to use the [place] system, and the app on his phone. Also went over instruction on how to properly apply the sensor to his arm. Libre sensor was placed on the back of pts upper left arm. He voices understanding on how to use the system and how to get BG reading for insulin injections and as needed. He was advised to contact the office if he has any questions or concerns.

## 2020-11-22 ENCOUNTER — Other Ambulatory Visit: Payer: Self-pay

## 2020-11-22 ENCOUNTER — Other Ambulatory Visit: Payer: Self-pay | Admitting: *Deleted

## 2020-11-22 DIAGNOSIS — R748 Abnormal levels of other serum enzymes: Secondary | ICD-10-CM | POA: Diagnosis not present

## 2020-11-22 DIAGNOSIS — D490 Neoplasm of unspecified behavior of digestive system: Secondary | ICD-10-CM

## 2020-11-22 DIAGNOSIS — N289 Disorder of kidney and ureter, unspecified: Secondary | ICD-10-CM

## 2020-11-22 MED ORDER — FREESTYLE LIBRE 2 SENSOR MISC: 1.0000 | 2 refills | Status: DC

## 2020-11-22 MED ORDER — INSULIN DETEMIR 100 UNIT/ML FLEXPEN: 40.0000 [IU] | PEN_INJECTOR | Freq: Every day | SUBCUTANEOUS | 0 refills | Status: DC

## 2020-11-22 MED ORDER — INSULIN LISPRO (1 UNIT DIAL) 100 UNIT/ML (KWIKPEN): 14.0000 [IU] | PEN_INJECTOR | Freq: Three times a day (TID) | SUBCUTANEOUS | 0 refills | Status: DC

## 2020-11-22 NOTE — Progress Notes (Signed)
Bw orders put in and pt states he will do this afternoon

## 2020-11-23 DIAGNOSIS — S338XXA Sprain of other parts of lumbar spine and pelvis, initial encounter: Secondary | ICD-10-CM | POA: Diagnosis not present

## 2020-11-23 DIAGNOSIS — S233XXA Sprain of ligaments of thoracic spine, initial encounter: Secondary | ICD-10-CM | POA: Diagnosis not present

## 2020-11-23 DIAGNOSIS — S134XXA Sprain of ligaments of cervical spine, initial encounter: Secondary | ICD-10-CM | POA: Diagnosis not present

## 2020-11-23 LAB — COMPREHENSIVE METABOLIC PANEL
ALT: 42 IU/L (ref 0–44)
AST: 23 IU/L (ref 0–40)
Albumin/Globulin Ratio: 1.4 (ref 1.2–2.2)
Albumin: 3.9 g/dL (ref 3.8–4.8)
Alkaline Phosphatase: 305 IU/L — ABNORMAL HIGH (ref 44–121)
BUN/Creatinine Ratio: 16 (ref 10–24)
BUN: 15 mg/dL (ref 8–27)
Bilirubin Total: 0.3 mg/dL (ref 0.0–1.2)
CO2: 21 mmol/L (ref 20–29)
Calcium: 8.5 mg/dL — ABNORMAL LOW (ref 8.6–10.2)
Chloride: 103 mmol/L (ref 96–106)
Creatinine, Ser: 0.91 mg/dL (ref 0.76–1.27)
Globulin, Total: 2.8 g/dL (ref 1.5–4.5)
Glucose: 160 mg/dL — ABNORMAL HIGH (ref 65–99)
Potassium: 5 mmol/L (ref 3.5–5.2)
Sodium: 141 mmol/L (ref 134–144)
Total Protein: 6.7 g/dL (ref 6.0–8.5)
eGFR: 95 mL/min/{1.73_m2} (ref >59)

## 2020-11-23 LAB — LIPASE: Lipase: 66 U/L (ref 13–78)

## 2020-11-23 LAB — CBC WITH DIFFERENTIAL/PLATELET
Basophils Absolute: 0 10*3/uL (ref 0.0–0.2)
Basos: 1 %
EOS (ABSOLUTE): 0.2 10*3/uL (ref 0.0–0.4)
Eos: 3 %
Hematocrit: 36.8 % — ABNORMAL LOW (ref 37.5–51.0)
Hemoglobin: 12.1 g/dL — ABNORMAL LOW (ref 13.0–17.7)
Immature Grans (Abs): 0 10*3/uL (ref 0.0–0.1)
Immature Granulocytes: 0 %
Lymphocytes Absolute: 1.9 10*3/uL (ref 0.7–3.1)
Lymphs: 24 %
MCH: 32.5 pg (ref 26.6–33.0)
MCHC: 32.9 g/dL (ref 31.5–35.7)
MCV: 99 fL — ABNORMAL HIGH (ref 79–97)
Monocytes Absolute: 0.8 10*3/uL (ref 0.1–0.9)
Monocytes: 10 %
Neutrophils Absolute: 5 10*3/uL (ref 1.4–7.0)
Neutrophils: 62 %
Platelets: 361 10*3/uL (ref 150–450)
RBC: 3.72 x10E6/uL — ABNORMAL LOW (ref 4.14–5.80)
RDW: 13.2 % (ref 11.6–15.4)
WBC: 8.1 10*3/uL (ref 3.4–10.8)

## 2020-11-23 LAB — AMYLASE: Amylase: 89 U/L (ref 31–110)

## 2020-11-27 DIAGNOSIS — S134XXA Sprain of ligaments of cervical spine, initial encounter: Secondary | ICD-10-CM | POA: Diagnosis not present

## 2020-11-27 DIAGNOSIS — S233XXA Sprain of ligaments of thoracic spine, initial encounter: Secondary | ICD-10-CM | POA: Diagnosis not present

## 2020-11-27 DIAGNOSIS — S338XXA Sprain of other parts of lumbar spine and pelvis, initial encounter: Secondary | ICD-10-CM | POA: Diagnosis not present

## 2020-11-29 ENCOUNTER — Inpatient Hospital Stay: Payer: BC Managed Care – PPO | Admitting: Family Medicine

## 2020-11-30 ENCOUNTER — Encounter (HOSPITAL_COMMUNITY): Payer: Self-pay | Admitting: Gastroenterology

## 2020-11-30 ENCOUNTER — Other Ambulatory Visit: Payer: Self-pay

## 2020-11-30 ENCOUNTER — Encounter (HOSPITAL_COMMUNITY)
Admission: RE | Disposition: A | Discharge status: Home / Self Care | Payer: Self-pay | Source: Ambulatory Visit | Attending: Gastroenterology

## 2020-11-30 ENCOUNTER — Ambulatory Visit (HOSPITAL_COMMUNITY): Payer: BC Managed Care – PPO | Admitting: Anesthesiology

## 2020-11-30 ENCOUNTER — Ambulatory Visit (HOSPITAL_COMMUNITY)
Admission: RE | Admit: 2020-11-30 | Discharge: 2020-11-30 | Disposition: A | Discharge status: Home / Self Care | Payer: BC Managed Care – PPO | Source: Ambulatory Visit | Attending: Gastroenterology | Admitting: Gastroenterology

## 2020-11-30 DIAGNOSIS — F17290 Nicotine dependence, other tobacco product, uncomplicated: Secondary | ICD-10-CM | POA: Diagnosis not present

## 2020-11-30 DIAGNOSIS — K611 Rectal abscess: Secondary | ICD-10-CM | POA: Insufficient documentation

## 2020-11-30 DIAGNOSIS — R634 Abnormal weight loss: Secondary | ICD-10-CM | POA: Insufficient documentation

## 2020-11-30 DIAGNOSIS — Z794 Long term (current) use of insulin: Secondary | ICD-10-CM | POA: Insufficient documentation

## 2020-11-30 DIAGNOSIS — Z6825 Body mass index (BMI) 25.0-25.9, adult: Secondary | ICD-10-CM | POA: Insufficient documentation

## 2020-11-30 DIAGNOSIS — R932 Abnormal findings on diagnostic imaging of liver and biliary tract: Secondary | ICD-10-CM | POA: Diagnosis not present

## 2020-11-30 DIAGNOSIS — Z888 Allergy status to other drugs, medicaments and biological substances status: Secondary | ICD-10-CM | POA: Diagnosis not present

## 2020-11-30 DIAGNOSIS — C250 Malignant neoplasm of head of pancreas: Secondary | ICD-10-CM | POA: Insufficient documentation

## 2020-11-30 DIAGNOSIS — E785 Hyperlipidemia, unspecified: Secondary | ICD-10-CM | POA: Insufficient documentation

## 2020-11-30 DIAGNOSIS — E039 Hypothyroidism, unspecified: Secondary | ICD-10-CM | POA: Insufficient documentation

## 2020-11-30 DIAGNOSIS — E1165 Type 2 diabetes mellitus with hyperglycemia: Secondary | ICD-10-CM | POA: Diagnosis not present

## 2020-11-30 DIAGNOSIS — Z79899 Other long term (current) drug therapy: Secondary | ICD-10-CM | POA: Insufficient documentation

## 2020-11-30 DIAGNOSIS — Z79890 Hormone replacement therapy (postmenopausal): Secondary | ICD-10-CM | POA: Diagnosis not present

## 2020-11-30 DIAGNOSIS — K8689 Other specified diseases of pancreas: Secondary | ICD-10-CM | POA: Diagnosis not present

## 2020-11-30 DIAGNOSIS — D539 Nutritional anemia, unspecified: Secondary | ICD-10-CM | POA: Diagnosis not present

## 2020-11-30 HISTORY — PX: ESOPHAGOGASTRODUODENOSCOPY (EGD) WITH PROPOFOL: SHX5813

## 2020-11-30 HISTORY — PX: EUS: SHX5427

## 2020-11-30 HISTORY — PX: FINE NEEDLE ASPIRATION: SHX5430

## 2020-11-30 LAB — GLUCOSE, CAPILLARY: Glucose-Capillary: 108 mg/dL — ABNORMAL HIGH (ref 70–99)

## 2020-11-30 SURGERY — UPPER ENDOSCOPIC ULTRASOUND (EUS) RADIAL
Anesthesia: Monitor Anesthesia Care

## 2020-11-30 MED ORDER — SODIUM CHLORIDE 0.9 % IV SOLN
INTRAVENOUS | Status: DC
Start: 2020-11-30 — End: 2020-11-30

## 2020-11-30 MED ORDER — PROPOFOL 500 MG/50ML IV EMUL
INTRAVENOUS | Status: DC | PRN
  Administered 2020-11-30: 135 ug/kg/min via INTRAVENOUS

## 2020-11-30 MED ORDER — PROPOFOL 10 MG/ML IV BOLUS
INTRAVENOUS | Status: DC | PRN
  Administered 2020-11-30: 20 mg via INTRAVENOUS
  Administered 2020-11-30: 30 mg via INTRAVENOUS

## 2020-11-30 MED ORDER — LACTATED RINGERS IV SOLN: INTRAVENOUS | Status: DC | PRN

## 2020-11-30 MED ORDER — PROPOFOL 1000 MG/100ML IV EMUL
INTRAVENOUS | Status: AC
  Filled 2020-11-30: qty 100

## 2020-11-30 NOTE — Anesthesia Postprocedure Evaluation (Signed)
Anesthesia Post Note  Patient: Alexander Banks  Procedure(s) Performed: UPPER ENDOSCOPIC ULTRASOUND (EUS) RADIAL FINE NEEDLE ASPIRATION (FNA) LINEAR     Patient location during evaluation: PACU Anesthesia Type: MAC Level of consciousness: awake and alert Pain management: pain level controlled Vital Signs Assessment: post-procedure vital signs reviewed and stable Respiratory status: spontaneous breathing, nonlabored ventilation and respiratory function stable Cardiovascular status: blood pressure returned to baseline and stable Postop Assessment: no apparent nausea or vomiting Anesthetic complications: no   No notable events documented.  Last Vitals:  Vitals:   11/30/20 1230 11/30/20 1240  BP: 126/63 137/80  Pulse: 83 82  Resp: (!) 21 (!) 21  Temp:    SpO2: 97% 95%    Last Pain:  Vitals:   11/30/20 1240  TempSrc:   PainSc: 0-No pain                 Pervis Hocking

## 2020-11-30 NOTE — Transfer of Care (Signed)
Immediate Anesthesia Transfer of Care Note  Patient: Alexander Banks  Procedure(s) Performed: UPPER ENDOSCOPIC ULTRASOUND (EUS) RADIAL FINE NEEDLE ASPIRATION (FNA) LINEAR  Patient Location: PACU  Anesthesia Type:MAC  Level of Consciousness: awake  Airway & Oxygen Therapy: Patient Spontanous Breathing and Patient connected to face mask oxygen  Post-op Assessment: Report given to RN, Post -op Vital signs reviewed and stable and Patient moving all extremities X 4  Post vital signs: Reviewed and stable  Last Vitals:  Vitals Value Taken Time  BP 117/56   Temp    Pulse 85   Resp 17   SpO2 100     Last Pain:  Vitals:   11/30/20 1030  TempSrc: Oral  PainSc: 0-No pain         Complications: No notable events documented.

## 2020-11-30 NOTE — Anesthesia Preprocedure Evaluation (Addendum)
Anesthesia Evaluation  Patient identified by MRN, date of birth, ID band Patient awake    Reviewed: Allergy & Precautions, NPO status , Patient's Chart, lab work & pertinent test results  Airway Mallampati: III  TM Distance: >3 FB Neck ROM: Full   Comment: Large beard Dental  (+) Poor Dentition, Dental Advisory Given,    Pulmonary sleep apnea and Continuous Positive Airway Pressure Ventilation , Current Smoker and Patient abstained from smoking.,  3-4 cigars/d   Pulmonary exam normal breath sounds clear to auscultation       Cardiovascular (-) hypertensionNormal cardiovascular exam Rhythm:Regular Rate:Normal     Neuro/Psych negative neurological ROS  negative psych ROS   GI/Hepatic negative GI ROS, (+)     substance abuse  alcohol use, Pancreatic mass   Endo/Other  diabetes, Poorly Controlled, Type 2, Insulin Dependenta1c 12.8 FS 108 this morning  Renal/GU negative Renal ROS  negative genitourinary   Musculoskeletal  (+) Arthritis , Osteoarthritis,    Abdominal   Peds  Hematology  (+) Blood dyscrasia, anemia , hct 36.8   Anesthesia Other Findings HOH  Reproductive/Obstetrics negative OB ROS                            Anesthesia Physical Anesthesia Plan  ASA: 3  Anesthesia Plan: MAC   Post-op Pain Management:    Induction:   PONV Risk Score and Plan: Propofol infusion, TIVA and Treatment may vary due to age or medical condition  Airway Management Planned: Natural Airway and Nasal Cannula  Additional Equipment: None  Intra-op Plan:   Post-operative Plan:   Informed Consent: I have reviewed the patients History and Physical, chart, labs and discussed the procedure including the risks, benefits and alternatives for the proposed anesthesia with the patient or authorized representative who has indicated his/her understanding and acceptance.     Dental advisory given  Plan  Discussed with: CRNA  Anesthesia Plan Comments:        Anesthesia Quick Evaluation

## 2020-11-30 NOTE — Discharge Instructions (Signed)
YOU HAD AN ENDOSCOPIC PROCEDURE TODAY: Refer to the procedure report and other information in the discharge instructions given to you for any specific questions about what was found during the examination. If this information does not answer your questions, please call [facility] office at [phone] to clarify.   YOU SHOULD EXPECT: Some feelings of bloating in the abdomen. Passage of more gas than usual. Walking can help get rid of the air that was put into your GI tract during the procedure and reduce the bloating. If you had a lower endoscopy (such as a colonoscopy or flexible sigmoidoscopy) you may notice spotting of blood in your stool or on the toilet paper. Some abdominal soreness may be present for a day or two, also.  DIET: Your first meal following the procedure should be a light meal and then it is ok to progress to your normal diet. A half-sandwich or bowl of soup is an example of a good first meal. Heavy or fried foods are harder to digest and may make you feel nauseous or bloated. Drink plenty of fluids but you should avoid alcoholic beverages for 24 hours. If you had a esophageal dilation, please see attached instructions for diet.    ACTIVITY: Your care partner should take you home directly after the procedure. You should plan to take it easy, moving slowly for the rest of the day. You can resume normal activity the day after the procedure however YOU SHOULD NOT DRIVE, use power tools, machinery or perform tasks that involve climbing or major physical exertion for 24 hours (because of the sedation medicines used during the test).   SYMPTOMS TO REPORT IMMEDIATELY: A gastroenterologist can be reached at any hour. Please call [phone]  for any of the following symptoms:   Following upper endoscopy (EGD, EUS, ERCP, esophageal dilation) Vomiting of blood or coffee ground material  New, significant abdominal pain  New, significant chest pain or pain under the shoulder blades  Painful or  persistently difficult swallowing  New shortness of breath  Black, tarry-looking or red, bloody stools  FOLLOW UP:  If any biopsies were taken you will be contacted by phone or by letter within the next 1-3 weeks. Call [phone]  if you have not heard about the biopsies in 3 weeks.  Please also call with any specific questions about appointments or follow up tests.  

## 2020-11-30 NOTE — Op Note (Signed)
[facility] Patient Name: Alexander Banks Procedure Date: 11/30/2020 MRN: [id number] Attending MD: Milus Banister , MD Date of Birth: Sep 29, 1955 CSN: [id number] Age: 65 Admit Type: Outpatient Procedure:                Upper EUS Indications:              Painless jaundice, vague mass in head of pancreas                            on MRI causing biliary obstruction, status post                            ERCP with Dr. [name] with (non diagnostic) biliary                            brushing and placement of plastic biliary stent Providers:                Milus Banister, MD, Glori Bickers, RN, Hinton Dyer,                            Ladona Ridgel, Technician, Maudry Diego, CRNA Referring MD:             Hildred Laser, MD Medicines:                Monitored Anesthesia Care Complications:            No immediate complications. Estimated blood loss:                            None. Estimated Blood Loss:     Estimated blood loss: none. Procedure:                Pre-Anesthesia Assessment:                           - Prior to the procedure, a History and Physical                            was performed, and patient medications and                            allergies were reviewed. The patient's tolerance of                            previous anesthesia was also reviewed. The risks                            and benefits of the procedure and the sedation                            options and risks were discussed with the patient.                            All questions were answered, and informed consent  was obtained. Prior Anticoagulants: The patient has                            taken no previous anticoagulant or antiplatelet                            agents. ASA Grade Assessment: II - A patient with                            mild systemic disease. After reviewing the risks                            and benefits, the patient was deemed  in                            satisfactory condition to undergo the procedure.                           After obtaining informed consent, the endoscope was                            passed under direct vision. Throughout the                            procedure, the patient's blood pressure, pulse, and                            oxygen saturations were monitored continuously. The                            [id number]-[id number] ([id number]) Olympus Radial EUS was                            introduced through the mouth, and advanced to the                            second part of duodenum. The [id number] ([id number])                            Olympus Linear EUS was introduced through the                            mouth, and advanced to the second part of duodenum.                            The upper EUS was accomplished without difficulty.                            The patient tolerated the procedure well. Scope In: Scope Out: Findings:      ENDOSCOPIC FINDING: :      The examined esophagus was endoscopically normal.      The entire examined stomach was endoscopically normal.      The examined duodenum was endoscopically normal except for the  previously placed plastic biliary stent extending across the major       papilla in good position.      ENDOSONOGRAPHIC FINDING: :      1. Indistinct, amorphous soft tissue mass in the head of the pancreas       with very unclear outer borders. The mass measures about 2.9 cm       maximally. The previously placed plastic biliary stent passes adjacent       to the pancreatic head mass. The mass is causing significant dilation of       the main pancreatic duct up to 6 mm in the body and tail. The previous       extrahepatic biliary dilation has resolved with placement of the biliary       stent. Given the indistinct outer borders I was unable to confidently       assess major vascular involvement but I think there is abutment and       probable invasion  of the SMV and portal vein near the confluence with       the splenic vein. There was not obvious SMA or celiac artery       involvement. I performed fine-needle aspiration using a 25-gauge EUS FNA       needle, 3 transduodenal passes with a Cytotec present.      2. No peripancreatic adenopathy      3. The gallbladder was distended and thick-walled.      4. Limited views of the liver, spleen were all normal. Impression:               - Indistinct, amorphous, approximately 2.9 cm                            maximally soft tissue mass in the head of the                            pancreas causing previous biliary obstruction and                            pancreatic duct obstruction. The mass probably                            involves the portal vein and SMV near the                            confluence with the splenic vein however the vague                            borders and indistinct amorphous nature of the                            tumor decreases the accuracy of assessing vascular                            invasion. The mass was sampled with fine-needle                            aspiration and preliminary cytology review was  positive for malignancy, adenocarcinoma.                           - Previously placed biliary stent is in good                            position. Moderate Sedation:      Not Applicable - Patient had care per Anesthesia. Recommendation:           - Await cytology results.                           - I will communicate preliminary results to Drs.                            Rehman and Iruku. Procedure Code(s):        --- Professional ---                           [phone], Esophagogastroduodenoscopy, flexible,                            transoral; with transendoscopic ultrasound-guided                            intramural or transmural fine needle                            aspiration/biopsy(s), (includes endoscopic                             ultrasound examination limited to the esophagus,                            stomach or duodenum, and adjacent structures) Diagnosis Code(s):        --- Professional ---                           K86.89, Other specified diseases of pancreas                           R93.2, Abnormal findings on diagnostic imaging of                            liver and biliary tract CPT copyright 2019 [employer]. All rights reserved. The codes documented in this report are preliminary and upon coder review may  be revised to meet current compliance requirements. Milus Banister, MD 11/30/2020 12:27:41 PM This report has been signed electronically. Number of Addenda: 0

## 2020-11-30 NOTE — Anesthesia Procedure Notes (Signed)
Procedure Name: MAC Date/Time: 11/30/2020 11:23 AM Performed by: Niel Hummer, CRNA Pre-anesthesia Checklist: Patient identified, Emergency Drugs available, Patient being monitored and Suction available Oxygen Delivery Method: Simple face mask

## 2020-11-30 NOTE — Interval H&P Note (Signed)
History and Physical Interval Note:  11/30/2020 10:42 AM  Alexander Banks  has presented today for surgery, with the diagnosis of pancreatic mass.  The various methods of treatment have been discussed with the patient and family. After consideration of risks, benefits and other options for treatment, the patient has consented to  Procedure(s): UPPER ENDOSCOPIC ULTRASOUND (EUS) RADIAL (N/A) as a surgical intervention.  The patient's history has been reviewed, patient examined, no change in status, stable for surgery.  I have reviewed the patient's chart and labs.  Questions were answered to the patient's satisfaction.     Milus Banister

## 2020-12-01 LAB — CYTOLOGY - NON PAP

## 2020-12-02 NOTE — Progress Notes (Signed)
[place], [place] [address]   CLINIC:  Medical Oncology/Hematology  PCP:  [name], MD [place] / [place] [address] [phone]   REASON FOR VISIT:  Follow-up for pancreatic adenocarcinoma  PRIOR THERAPY: none  NGS Results: not done  CURRENT THERAPY: surveillance  CANCER STAGING: Cancer Staging No matching staging information was found for the patient.  Virtual Visit via Video Note  I connected with [name] on @TODAY @ at 11:00 AM EDT by a video enabled telemedicine application and verified that I am speaking with the correct person using two identifiers.  Location: Patient: clinic Provider: home  Others present: Renda Rolls, RN   I discussed the limitations of evaluation and management by telemedicine and the availability of in person appointments. The patient expressed understanding and agreed to proceed.  INTERVAL HISTORY:  [name], a 65 y.o. male, returns for routine follow-up of his pancreatic mass. Kendall was last seen on 11/17/20.   Today he reports feeling well and is accompanied by his wife. He is scheduled for a CT AP tomorrow. He reports waxing and waning dull back pain from his hips to his shoulder blades since being discharged from the hospital on 07/14; this pain is intermittent, can last for several hours, and is helped with heat. His appetite is absent, and but he is still eating 3 meals daily. He was started on insulin for his diabetes during his hospital admission on 07/14.   His MGM might have had cancer, but he is unsure of what kind. His sister had acute leukemia. He lives at home with his wife, and does all of his normal home activities. He is currently working in Press [employer]. He denies any unusual chemical exposure. He has been a smoker since he was 53, and he smokes around 4 cigars daily.   REVIEW OF SYSTEMS:  Review of Systems  Constitutional:  Positive for appetite  change.  Musculoskeletal:  Positive for back pain.   PAST MEDICAL/SURGICAL HISTORY:  Past Medical History:  Diagnosis Date   Anemia, iron deficiency    Diabetes mellitus without complication ([place])    on meds   Hearing loss    Bil hearing aids   Hyperlipidemia    Hypertension    Prediabetes    Sleep apnea    Past Surgical History:  Procedure Laterality Date   BILIARY STENT PLACEMENT N/A 11/09/2020   Procedure: BILIARY STENT PLACEMENT - 17 F X 5 CM STENT;  Surgeon: Rogene Houston, MD;  Location: [facility];  Service: Endoscopy;  Laterality: N/A;   COLONOSCOPY     ERCP N/A 11/09/2020   Procedure: ENDOSCOPIC RETROGRADE CHOLANGIOPANCREATOGRAPHY (ERCP);  Surgeon: Rogene Houston, MD;  Location: [facility];  Service: Endoscopy;  Laterality: N/A;   ESOPHAGOGASTRODUODENOSCOPY (EGD) WITH PROPOFOL N/A 11/30/2020   Procedure: ESOPHAGOGASTRODUODENOSCOPY (EGD) WITH PROPOFOL;  Surgeon: Milus Banister, MD;  Location: [facility];  Service: Endoscopy;  Laterality: N/A;   EUS N/A 11/30/2020   Procedure: UPPER ENDOSCOPIC ULTRASOUND (EUS) RADIAL;  Surgeon: Milus Banister, MD;  Location: [facility];  Service: Endoscopy;  Laterality: N/A;   FINE NEEDLE ASPIRATION N/A 11/30/2020   Procedure: FINE NEEDLE ASPIRATION (FNA) LINEAR;  Surgeon: Milus Banister, MD;  Location: [facility];  Service: Endoscopy;  Laterality: N/A;   GIVENS CAPSULE STUDY     KNEE SURGERY     4 surgeries on left, 2 on right knee   SHOULDER SURGERY  right shoulder   SPHINCTEROTOMY N/A 11/09/2020   Procedure: SHORT BILIARY SPHINCTEROTOMY;  Surgeon: Rogene Houston, MD;  Location: [facility];  Service: Endoscopy;  Laterality: N/A;    SOCIAL HISTORY:  Social History   Socioeconomic History   Marital status: Married    Spouse name: Not on file   Number of children: Not on file   Years of education: Not on file   Highest education level: Not on file  Occupational History   Not on file  Tobacco Use   Smoking status: Every Day     Types: Cigars   Smokeless tobacco: Never   Tobacco comments:    Smoke 3-4 cigars a day  Vaping Use   Vaping Use: Never used  Substance and Sexual Activity   Alcohol use: Yes    Comment: occasional   Drug use: Never   Sexual activity: Not on file  Other Topics Concern   Not on file  Social History Narrative   Not on file   Social Determinants of Health   Financial Resource Strain: Not on file  Food Insecurity: Not on file  Transportation Needs: Not on file  Physical Activity: Inactive   Days of Exercise per Week: 0 days   Minutes of Exercise per Session: 0 min  Stress: Not on file  Social Connections: Not on file  Intimate Partner Violence: Not At Risk   Fear of Current or Ex-Partner: No   Emotionally Abused: No   Physically Abused: No   Sexually Abused: No    FAMILY HISTORY:  Family History  Problem Relation Age of Onset   Heart attack Father    Heart attack Paternal Grandfather    Leukemia Sister    Heart attack Brother     CURRENT MEDICATIONS:  Current Outpatient Medications  Medication Sig Dispense Refill   Continuous Blood Gluc Receiver (FREESTYLE LIBRE 2 READER) DEVI As directed 1 each 0   Continuous Blood Gluc Sensor (FREESTYLE LIBRE 2 SENSOR) MISC 1 Piece by Does not apply route every 14 (fourteen) days. 6 each 2   doxycycline (VIBRA-TABS) 100 MG tablet Take 1 tablet (100 mg total) by mouth 2 (two) times daily. (Patient not taking: No sig reported) 20 tablet 0   Glucosamine-Chondroitin (OSTEO BI-FLEX REGULAR STRENGTH PO) Take 2 tablets by mouth daily.     glucose blood (ONETOUCH VERIO) test strip TEST ONE TIME DAILY 100 each 3   ibuprofen (ADVIL) 200 MG tablet Take 400 mg by mouth every 6 (six) hours as needed (back pain).     insulin detemir (LEVEMIR) 100 UNIT/ML FlexPen Inject 40 Units into the skin at bedtime. 45 mL 0   insulin lispro (HUMALOG KWIKPEN) 100 UNIT/ML KwikPen Inject 14-20 Units into the skin 3 (three) times daily before meals. (Patient  taking differently: Inject 12-15 Units into the skin 3 (three) times daily before meals.) 45 mL 0   IRON-VITAMIN C PO Take 1 tablet by mouth daily.     levothyroxine (SYNTHROID) 175 MCG tablet Take 1 tablet (175 mcg total) by mouth daily before breakfast.     mometasone (ELOCON) 0.1 % cream Apply 1 application topically 2 (two) times daily as needed. (Patient taking differently: Apply 1 application topically 2 (two) times daily as needed (rash).) 45 g 0   OneTouch Delica Lancets 17G MISC TEST ONE TIME DAILY 100 each 11   oxyCODONE-acetaminophen (PERCOCET/ROXICET) 5-325 MG tablet Take 1 tablet by mouth every 6 (six) hours as needed for severe pain.  rosuvastatin (CRESTOR) 40 MG tablet TAKE 1 TABLET DAILY (STOP ATORVASTATIN) (Patient taking differently: Take 40 mg by mouth daily. (STOP ATORVASTATIN)) 90 tablet 1   sildenafil (VIAGRA) 100 MG tablet Take 0.5-1 tablets (50-100 mg total) by mouth daily as needed for erectile dysfunction. 5 tablet 11   No current facility-administered medications for this visit.    ALLERGIES:  Allergies  Allergen Reactions   Lisinopril Cough   Performance status (ECOG): 0 - Asymptomatic  There were no vitals filed for this visit. Wt Readings from Last 3 Encounters:  11/30/20 196 lb (88.9 kg)  11/17/20 204 lb (92.5 kg)  11/17/20 203 lb (92.1 kg)    LABORATORY DATA:  I have reviewed the labs as listed.  CBC Latest Ref Rng & Units 11/22/2020 11/08/2020 11/07/2020  WBC 3.4 - 10.8 x10E3/uL 8.1 5.0 5.6  Hemoglobin 13.0 - 17.7 g/dL 12.1(L) 11.7(L) 11.5(L)  Hematocrit 37.5 - 51.0 % 36.8(L) 34.4(L) 34.3(L)  Platelets 150 - 450 x10E3/uL 361 400 396   CMP Latest Ref Rng & Units 11/22/2020 11/10/2020 11/09/2020  Glucose 65 - 99 mg/dL 160(H) - -  BUN 8 - 27 mg/dL 15 - -  Creatinine 0.76 - 1.27 mg/dL 0.91 1.39(H) -  Sodium 134 - 144 mmol/L 141 - -  Potassium 3.5 - 5.2 mmol/L 5.0 - -  Chloride 96 - 106 mmol/L 103 - -  CO2 20 - 29 mmol/L 21 - -  Calcium 8.6 - 10.2  mg/dL 8.5(L) - -  Total Protein 6.0 - 8.5 g/dL 6.7 5.9(L) 6.1(L)  Total Bilirubin 0.0 - 1.2 mg/dL 0.3 0.9 1.1  Alkaline Phos 44 - 121 IU/L 305(H) 446(H) 543(H)  AST 0 - 40 IU/L 23 26 79(H)  ALT 0 - 44 IU/L 42 110(H) 166(H)    DIAGNOSTIC IMAGING:  I have independently reviewed the scans and discussed with the patient. CT PELVIS W CONTRAST  Result Date: 11/06/2020 CLINICAL DATA:  Anal or rectal abscess. Pt. Admitted for blood sugar problems. Pt. Has known rectal abscess. EXAM: CT PELVIS WITH CONTRAST TECHNIQUE: Multidetector CT imaging of the pelvis was performed using the standard protocol following the bolus administration of intravenous contrast. CONTRAST:  53mL OMNIPAQUE IOHEXOL 300 MG/ML  SOLN COMPARISON:  None. FINDINGS: Urinary Tract:  No abnormality visualized. Bowel: Unremarkable visualized pelvic bowel loops. Ureteral jet noted within urinary bladder on the right. Vascular/Lymphatic: Atherosclerotic plaque. No pathologically enlarged lymph nodes. No significant vascular abnormality seen. Reproductive: Prominent prostate. Otherwise no mass or other significant abnormality Other: No intraperitoneal free fluid. No intraperitoneal free gas. No organized fluid collection. Musculoskeletal: Medial right gluteal subcutaneus soft tissue edema and several foci of gas with no definite organized fluid collection. Edema noted to extend from the right perirectal region. No CT findings of perineal or scrotal involvement. No suspicious bone lesions identified. IMPRESSION: 1. Right perirectal region edema extending to the medial right gluteal subcutaneus soft tissues with several associated foci of gas. No definite organized fluid collection. Please note necrotizing fasciitis cannot be excluded as this is a clinical diagnosis. 2. Prominent prostate. 3.  Aortic Atherosclerosis (ICD10-I70.0). Electronically Signed   By: Iven Finn M.D.   On: 11/06/2020 23:35   MR ABDOMEN MRCP WO CONTRAST  Result Date:  11/08/2020 CLINICAL DATA:  Cholelithiasis, evaluate for choledocholithiasis EXAM: MRI ABDOMEN WITHOUT CONTRAST  (INCLUDING MRCP) TECHNIQUE: Multiplanar multisequence MR imaging of the abdomen was performed. Heavily T2-weighted images of the biliary and pancreatic ducts were obtained, and three-dimensional MRCP images were rendered by post processing.  COMPARISON:  None. FINDINGS: Lower chest: No acute findings. Hepatobiliary: No mass or other parenchymal abnormality identified. Intra and extrahepatic biliary ductal dilatation, with relatively abrupt truncation of the central common bile duct just proximal to the ampulla. The central common bile duct measures up to 1.1 cm in caliber. Distended gallbladder. Pancreas: There is diffuse pancreatic ductal dilatation in the superior pancreatic head and distally, measuring up to 6 mm in caliber. There is a somewhat ill-defined, T1 and T2 isointense mass of the central pancreatic head and uncinate measuring approximately 2.3 x 2.0 cm (series 4, image 23, series 5, image 15). Spleen:  Within normal limits in size and appearance. Adrenals/Urinary Tract: No masses identified. No evidence of hydronephrosis. Stomach/Bowel: Visualized portions within the abdomen are unremarkable. Vascular/Lymphatic: No pathologically enlarged lymph nodes identified. No abdominal aortic aneurysm demonstrated. Other:  None. Musculoskeletal: No suspicious bone lesions identified. IMPRESSION: 1. There is a somewhat ill-defined, T1 and T2 isointense mass of the central pancreatic head and uncinate measuring approximately 2.3 x 2.0 cm. 2. Intra and extrahepatic biliary ductal dilatation as well as a distended gallbladder, with relatively abrupt truncation of the central common bile duct just proximal to the ampulla. 3. There is also diffuse pancreatic ductal dilatation in the superior pancreatic head and distally, with abrupt truncation in the pancreatic head. 4. Constellation of findings is highly  suspicious for pancreatic adenocarcinoma. There is no obvious evidence of abdominal metastatic disease on noncontrast MR. Electronically Signed   By: Eddie Candle M.D.   On: 11/08/2020 11:35   MR 3D Recon At Scanner  Result Date: 11/08/2020 CLINICAL DATA:  Cholelithiasis, evaluate for choledocholithiasis EXAM: MRI ABDOMEN WITHOUT CONTRAST  (INCLUDING MRCP) TECHNIQUE: Multiplanar multisequence MR imaging of the abdomen was performed. Heavily T2-weighted images of the biliary and pancreatic ducts were obtained, and three-dimensional MRCP images were rendered by post processing. COMPARISON:  None. FINDINGS: Lower chest: No acute findings. Hepatobiliary: No mass or other parenchymal abnormality identified. Intra and extrahepatic biliary ductal dilatation, with relatively abrupt truncation of the central common bile duct just proximal to the ampulla. The central common bile duct measures up to 1.1 cm in caliber. Distended gallbladder. Pancreas: There is diffuse pancreatic ductal dilatation in the superior pancreatic head and distally, measuring up to 6 mm in caliber. There is a somewhat ill-defined, T1 and T2 isointense mass of the central pancreatic head and uncinate measuring approximately 2.3 x 2.0 cm (series 4, image 23, series 5, image 15). Spleen:  Within normal limits in size and appearance. Adrenals/Urinary Tract: No masses identified. No evidence of hydronephrosis. Stomach/Bowel: Visualized portions within the abdomen are unremarkable. Vascular/Lymphatic: No pathologically enlarged lymph nodes identified. No abdominal aortic aneurysm demonstrated. Other:  None. Musculoskeletal: No suspicious bone lesions identified. IMPRESSION: 1. There is a somewhat ill-defined, T1 and T2 isointense mass of the central pancreatic head and uncinate measuring approximately 2.3 x 2.0 cm. 2. Intra and extrahepatic biliary ductal dilatation as well as a distended gallbladder, with relatively abrupt truncation of the central  common bile duct just proximal to the ampulla. 3. There is also diffuse pancreatic ductal dilatation in the superior pancreatic head and distally, with abrupt truncation in the pancreatic head. 4. Constellation of findings is highly suspicious for pancreatic adenocarcinoma. There is no obvious evidence of abdominal metastatic disease on noncontrast MR. Electronically Signed   By: Eddie Candle M.D.   On: 11/08/2020 11:35   NM Hepato W/EF  Result Date: 11/08/2020 CLINICAL DATA:  RIGHT upper quadrant pain, thickened gallbladder  wall with sludge question cholecystitis EXAM: NUCLEAR MEDICINE HEPATOBILIARY IMAGING TECHNIQUE: Sequential images of the abdomen were obtained out to 60 minutes following intravenous administration of radiopharmaceutical. RADIOPHARMACEUTICALS:  4.8 mCi Tc-40m  Choletec IV COMPARISON:  Ultrasound abdomen 11/07/2020, MRI/MRCP 11/08/2020 FINDINGS: Mildly delayed clearance of tracer from blood pool indicating component of hepatocellular dysfunction. Delayed excretion of tracer into biliary tree. At 1 hour, all of the tracer remains within the liver parenchyma. At 2 hours, all tracer remains within liver parenchyma, without visualization of the biliary tree. Delayed image at 4 hours reveals tracer within the small bowel indicating patency of the common bile duct. Additionally, tracer is not visualized within the gallbladder consistent with patent cystic duct. IMPRESSION: Hepatocellular dysfunction. Delayed visualization of tracer within the gallbladder, indicating presence of a patent cystic duct and an absence of acute cholecystitis; however, delayed visualization of the gallbladder can be seen in patients with chronic cholecystitis. Electronically Signed   By: Lavonia Dana M.D.   On: 11/08/2020 13:12   DG ERCP BILIARY & PANCREATIC DUCTS  Result Date: 11/09/2020 CLINICAL DATA:  Distal CBD obstruction, pancreatic mass by MR EXAM: ERCP WITH SPHINCTEROTOMY AND STENTING TECHNIQUE: Multiple spot  images obtained with the fluoroscopic device and submitted for interpretation post-procedure. FLUOROSCOPY TIME:  Fluoroscopy Time: 0 minutes 43 seconds Radiation Exposure Index (if provided by the fluoroscopic device): 17.07 mGy Number of Acquired Spot Images: multiple fluoroscopic screen captures COMPARISON:  MR 11/08/2020, ultrasound abdomen 11/07/2020 FINDINGS: Images demonstrate presence of a short high-grade distal CBD stricture with abrupt tapering of CBD diameter similar to that seen on MR. No filling defects identified within the dilated CBD, which measures up to 14 mm diameter. Good drainage of contrast following stent placement. IMPRESSION: Short high-grade distal CBD stricture with dilated CBD up to 14 mm diameter. Good drainage of contrast following stent placement. These images were submitted for radiologic interpretation only. Please see the procedural report for the amount of contrast and the fluoroscopy time utilized. Electronically Signed   By: Lavonia Dana M.D.   On: 11/09/2020 14:17   US Abdomen Limited RUQ (LIVER/GB)  Result Date: 11/07/2020 CLINICAL DATA:  Elevated LFTs. EXAM: ULTRASOUND ABDOMEN LIMITED RIGHT UPPER QUADRANT COMPARISON:  None. FINDINGS: Gallbladder: Gallbladder is distended with layering sludge. Gallbladder wall thickness upper normal to mildly increased at 3-4 mm. No appreciable pericholecystic fluid. The sonographer reports no sonographic Murphy sign. Common bile duct: Diameter: Dilated up to 7-10 mm diameter. Liver: Increased echogenicity suggests fatty deposition. Mild intrahepatic biliary duct dilatation evident. Portal vein is patent on color Doppler imaging with normal direction of blood flow towards the liver. Other: None. IMPRESSION: 1. Distended gallbladder with borderline to mild gallbladder wall thickening. No gallstones evident by ultrasound. If there is clinical concern for cystic duct obstruction, nuclear scintigraphy may prove helpful to further evaluate. Given  the biliary dilatation, MRI/MRCP may also prove helpful. 2. Probable hepatic steatosis. Electronically Signed   By: Misty Stanley M.D.   On: 11/07/2020 15:12     ASSESSMENT:  1.  Stage I (T2N0) pancreatic adenocarcinoma: - Recent admission to the hospital with DKA, found to have elevated liver enzymes. - MRCP without contrast on 11/08/2020 showed 2.3 x 2 cm central pancreatic head and uncinate mass with intra and extrahepatic biliary ductal dilation. - 11/09/2020-ERCP sphincterotomy and stenting with findings showing short high-grade distal CBD stricture with dilated CBD up to 14 mm.  Bile duct brushing was negative for malignant cells. - 11/10/2020-CA 19.9-367. - ERCP/EUS by [name]  on 11/30/2020 with 2.9 cm mass in the head of the pancreas with unclear outer borders.  Given significant dilatation, indistinct borders, unable to confidently assess major vascular involvement.  Abutment and probable invasion of SMV and portal vein near the confluence with splenic vein.  No obvious SMA or celiac artery involvement.  No other adenopathy. - FNA of the pancreatic mass consistent with adenocarcinoma. - Weight loss intentional 25 pounds between January and April 1.  Additional 25 pound weight loss since 1 April, unintentional.  2.  Social/family history: - He lives at home with his wife and is independent of all ADLs and IADLs. - He currently works in Press [employer].  No history of chemical exposure. - He previously smoked cigarettes.  Currently smokes cigars 4/day. - Maternal grandmother died at age 53 with malignancy, unknown type. - Sister died of acute leukemia.   PLAN:  1.  Stage I (T2N0) pancreatic adenocarcinoma: - We discussed FNA reports and the new diagnosis in detail. - I have recommended CT abdomen pancreatic protocol. - I have also recommended a PET CT scan to evaluate for metastatic disease. - Recommend surgical evaluation by Dr. Barry Dienes. - We will discuss at GI tumor board and see if he needs  neoadjuvant chemotherapy.  He has high risk factors including 25 pound weight loss and elevated CA 19-9 level of 367. - If he is a resectable candidate, he will receive a adjuvant chemotherapy for 6 months.  We briefly discussed modified FOLFIRINOX regimen as well as gemcitabine and nab-paclitaxel. - I have also recommended port placement for chemotherapy administration. - Also recommend germline mutation testing. - Plan to see him back after the scans.  2.  Weight loss: - He lost 25 pounds since April without trying to lose weight. - He reports complete loss of appetite.  However he is eating 3 meals per day. - I have recommended appetite stimulation with Megace 5 mg twice daily.  3.  Perirectal abscess: - CT pelvis with contrast on 11/06/2020 showed right perirectal edema extending into the medial right gluteal subcutaneous soft tissues with several associated foci of gas.  He was treated with antibiotics.  He is currently taking doxycycline twice daily.  4.  Diabetes: - He reports having diabetes for 5 to 6 years.  Does not report any neuropathy from it. - Currently on Humalog 15 units with breakfast and lunch and 13 units with dinner.  He is also on 40 units Levemir at bedtime.  This is being managed by Dr. Dorris Fetch.  5.  Paraspinal low back pain: - Since discharge from the hospital he reported dull pain in the paraspinal region, occasionally intense.  Heat helps.  Pain is intermittent and can last up to few hours. - He is taking half tablet of Percocet 5/325 as needed.   Orders placed this encounter:  No orders of the defined types were placed in this encounter.  I provided 40 minutes of non-face-to-face time during this encounter.  Derek Jack, MD [place] [phone]   I, Thana Ates, am acting as a scribe for Dr. Derek Jack.  I, Derek Jack MD, have reviewed the above documentation for accuracy and completeness, and I agree with the  above.

## 2020-12-04 ENCOUNTER — Other Ambulatory Visit: Payer: Self-pay

## 2020-12-04 ENCOUNTER — Inpatient Hospital Stay (HOSPITAL_BASED_OUTPATIENT_CLINIC_OR_DEPARTMENT_OTHER): Payer: BC Managed Care – PPO | Admitting: Hematology

## 2020-12-04 VITALS — BP 122/75 | HR 91 | Resp 20 | Wt 198.6 lb

## 2020-12-04 DIAGNOSIS — C259 Malignant neoplasm of pancreas, unspecified: Secondary | ICD-10-CM | POA: Insufficient documentation

## 2020-12-04 DIAGNOSIS — K8689 Other specified diseases of pancreas: Secondary | ICD-10-CM | POA: Diagnosis not present

## 2020-12-04 MED ORDER — DRONABINOL 5 MG PO CAPS: 5.0000 mg | ORAL_CAPSULE | Freq: Two times a day (BID) | ORAL | 2 refills | Status: DC

## 2020-12-04 NOTE — Patient Instructions (Addendum)
[place] at [facility] Discharge Instructions  You were seen today by [name]. He went over your recent results. You will be referred for genetics testing as well as a surgery consultation with Dr. Barry Dienes. You have been prescribed marinol to help increase your appetite; take 5 mg 2 times daily. [name] will see you back in following your PET and CT scans for follow up.   Thank you for choosing [place] at [facility] to provide your oncology and hematology care.  To afford each patient quality time with our provider, please arrive at least 15 minutes before your scheduled appointment time.   If you have a lab appointment with the [facility] please come in thru the Main Entrance and check in at the main information desk  You need to re-schedule your appointment should you arrive 10 or more minutes late.  We strive to give you quality time with our providers, and arriving late affects you and other patients whose appointments are after yours.  Also, if you no show three or more times for appointments you may be dismissed from the clinic at the providers discretion.     Again, thank you for choosing [facility].  Our hope is that these requests will decrease the amount of time that you wait before being seen by our physicians.       _____________________________________________________________  Should you have questions after your visit to [facility], please contact our office at [phone] between the hours of 8:00 a.m. and 4:30 p.m.  Voicemails left after 4:00 p.m. will not be returned until the following business day.  For prescription refill requests, have your pharmacy contact our office and allow 72 hours.    Cancer Center Support Programs:   > Cancer Support Group  2nd Tuesday of the month 1pm-2pm, Journey Room

## 2020-12-05 ENCOUNTER — Ambulatory Visit (HOSPITAL_BASED_OUTPATIENT_CLINIC_OR_DEPARTMENT_OTHER)
Admission: RE | Admit: 2020-12-05 | Discharge: 2020-12-05 | Disposition: A | Discharge status: Home / Self Care | Payer: BC Managed Care – PPO | Source: Ambulatory Visit | Attending: Hematology | Admitting: Hematology

## 2020-12-05 ENCOUNTER — Other Ambulatory Visit (HOSPITAL_COMMUNITY): Payer: Self-pay | Admitting: Hematology

## 2020-12-05 DIAGNOSIS — I700 Atherosclerosis of aorta: Secondary | ICD-10-CM | POA: Diagnosis not present

## 2020-12-05 DIAGNOSIS — K8689 Other specified diseases of pancreas: Secondary | ICD-10-CM

## 2020-12-05 IMAGING — CT CT ABD-PEL WO/W CM
2 of 12 series · 11 of 46 positions shown, 12 images · IV contrast (APPLIED)
Comparison: MRCP [DATE].  Pelvic CT [DATE].
COMPARISON: MRCP [DATE].  Pelvic CT [DATE].

Addendum:
CLINICAL DATA: Recent diagnosis of pancreatic cancer.  Staging.

EXAM:
CT ABDOMEN AND PELVIS WITHOUT AND WITH CONTRAST
TECHNIQUE: Multidetector CT imaging of the abdomen and pelvis was performed
following the standard protocol before and following the bolus
administration of intravenous contrast.
CONTRAST:  100mL OMNIPAQUE IOHEXOL 350 MG/ML SOLN

[Series 7: cor arterial · coronal · arterial · 0.41mm/px · 3 of 153 slices shown]
[im 39/153  soft-tissue]
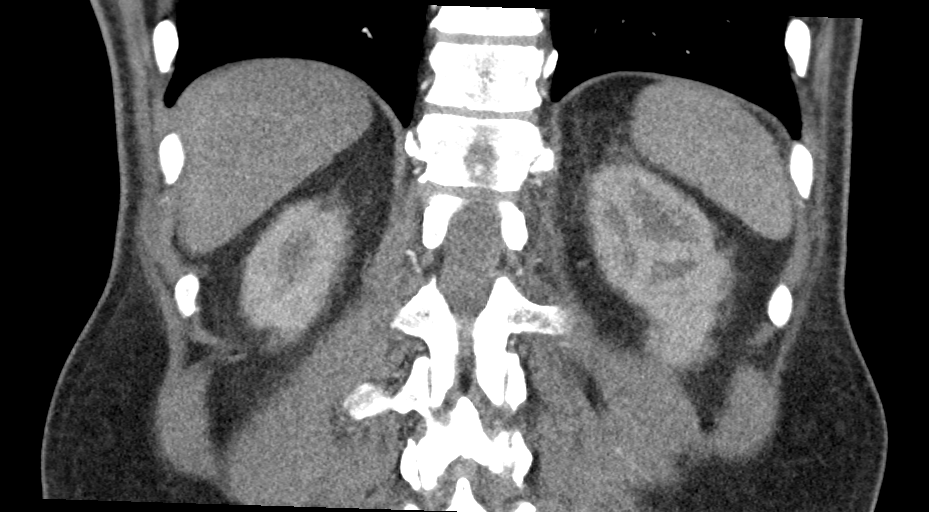
[im 77/153  soft-tissue]
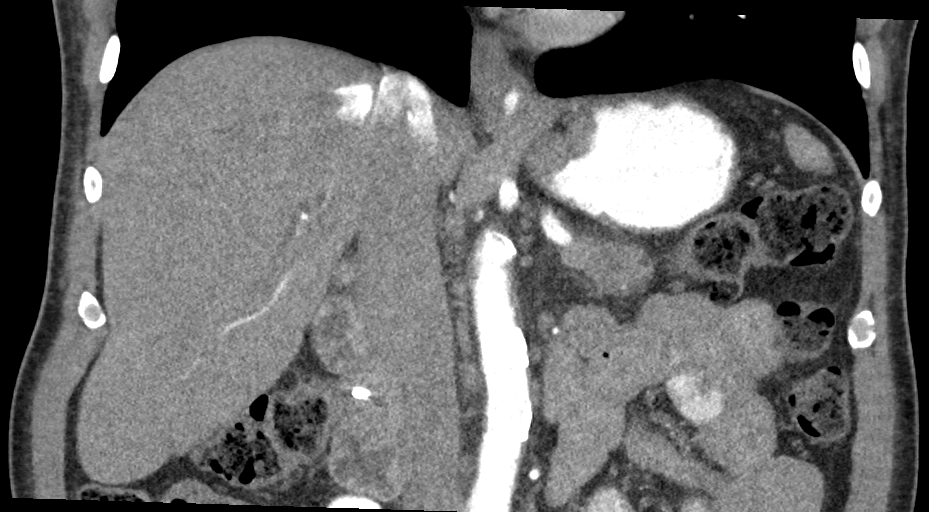
[im 115/153  soft-tissue]
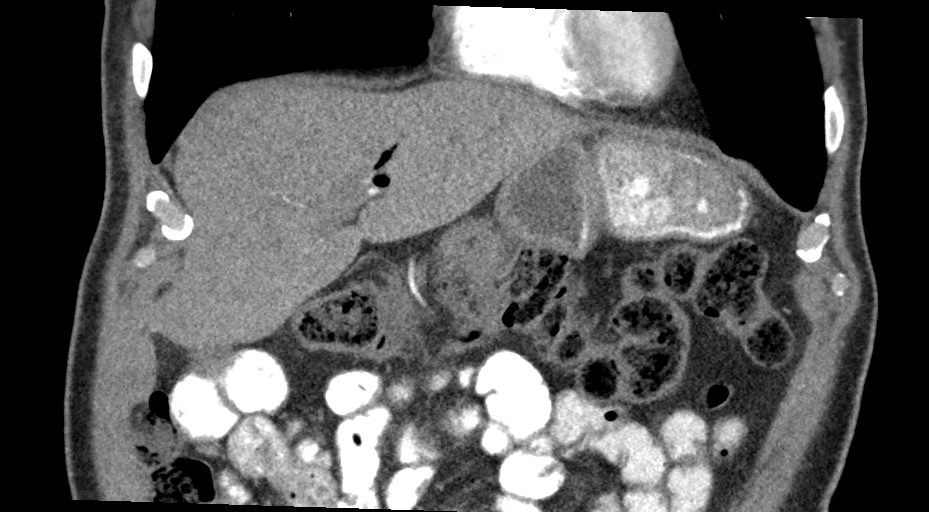

[Series 11: portal pv 2.0 · axial · portal-venous · 0.76mm/px · z∈[+830,+1212]mm · 8 of 235 slices shown, 9 images]
[im 22/235  soft-tissue]
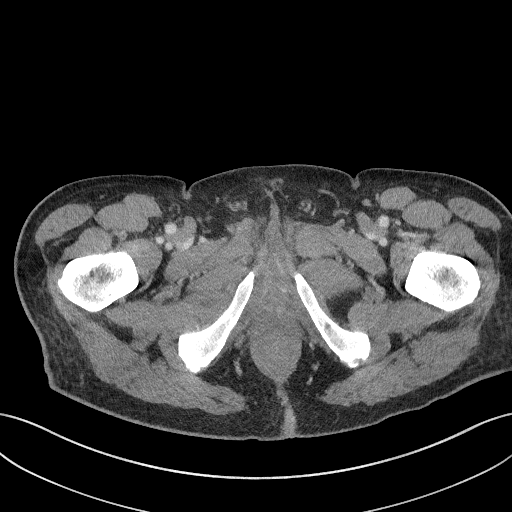
[im 22/235  bone]
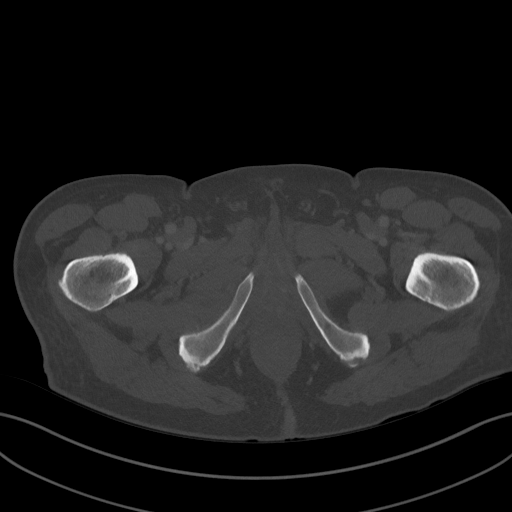
[im 43/235  soft-tissue]
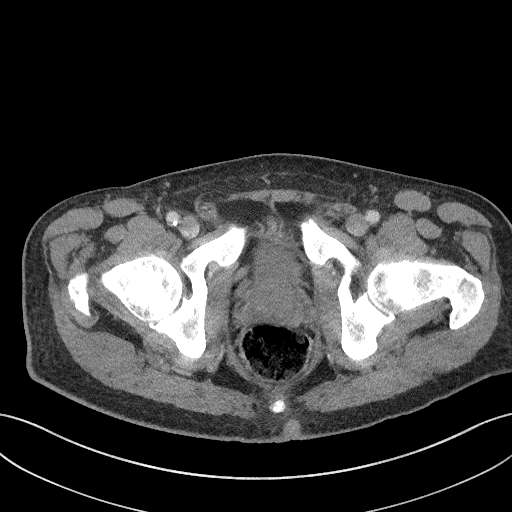
[im 86/235  soft-tissue]
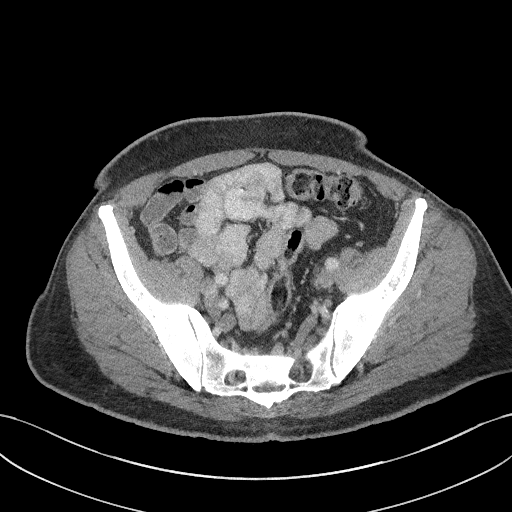
[im 107/235  soft-tissue]
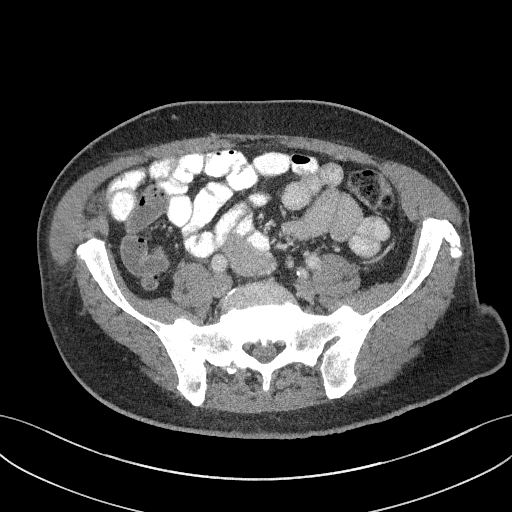
[im 128/235  soft-tissue]
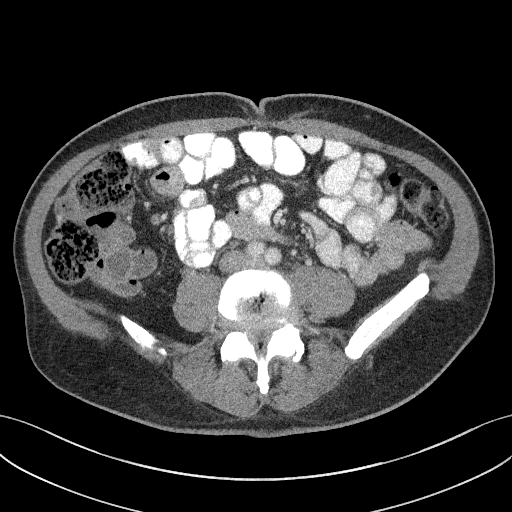
[im 149/235  soft-tissue]
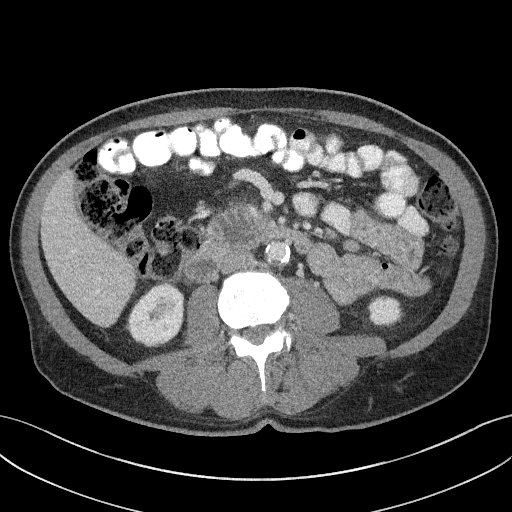
[im 192/235  soft-tissue]
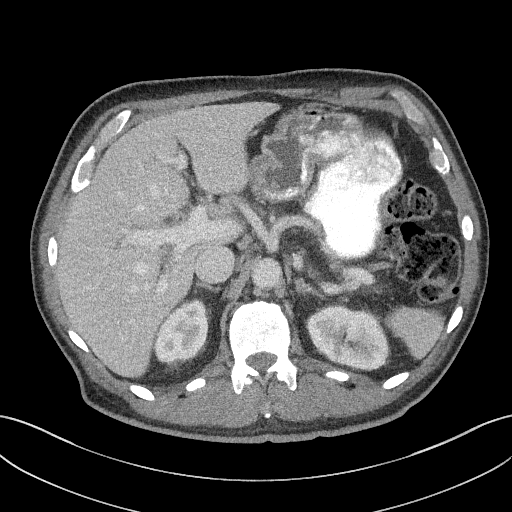
[im 213/235  soft-tissue]
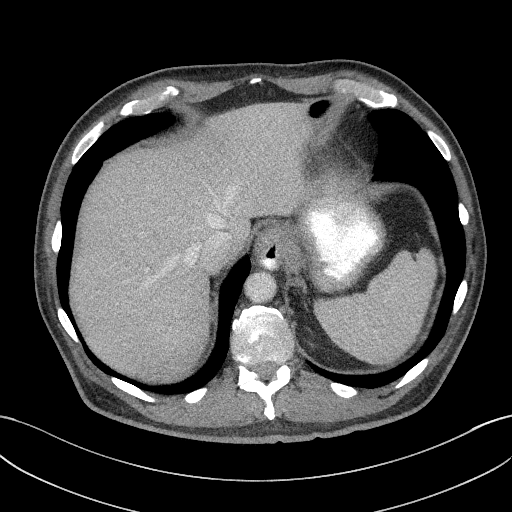

[11 of 46 positions shown; findings below may reference images not displayed]

FINDINGS: Lower chest: Clear lung bases. No significant pleural or pericardial
effusion.

Hepatobiliary: Interval flexible biliary stent placement. The stent
is relatively inferiorly positioned, but grossly stable compared
with ERCP images [LZ] and patent. There is air and contrast within
the biliary system which is no longer significantly dilated. Mild
nonspecific gallbladder wall thickening. No evidence of calcified
gallstones. There are no focal hepatic lesions to suggest metastatic
disease.

Pancreas: Ill-defined mass in the pancreatic head and uncinate
process is best seen on series 9, measuring up to 1.9 x 2.1 cm on
image 27. Associated pancreatic ductal dilatation up to 8 mm,
similar to previous study. This pancreatic mass causes no arterial
or venous encasement or occlusion.

Spleen: Normal in size without focal abnormality.

Adrenals/Urinary Tract: Both adrenal glands appear normal. Small
renal cysts. No evidence of renal mass, urinary tract calculus or
hydronephrosis. The bladder appears unremarkable.

Stomach/Bowel: Enteric contrast was administered and has passed into
the distal small bowel. There is a small hiatal hernia. The stomach
otherwise appears unremarkable for its degree of distention. No
evidence of bowel wall thickening, distention or surrounding
inflammation. The appendix is immediately inferior to the liver and
appears unremarkable.

Vascular/Lymphatic: There are no enlarged abdominal or pelvic lymph
nodes. Aortic and branch vessel atherosclerosis without evidence of
aneurysm. As above, no evidence of vascular encasement or occlusion
by the pancreatic mass. The portal, superior mesenteric and splenic
veins are patent.

Reproductive: The prostate gland and seminal vesicles appear
unremarkable.

Other: No ascites or peritoneal nodularity. The abdominal wall
appears intact.

Musculoskeletal: No acute or significant osseous findings. Mild
lumbar spine facet arthropathy. The sacroiliac joints are partially
ankylosed bilaterally.
IMPRESSION: 1. Grossly stable ill-defined mass within the pancreatic head and
uncinate process, measuring up to 2.1 cm in diameter consistent with
known pancreatic cancer. No evidence of vascular encasement or
occlusion.
2. Resolution of biliary dilatation status post biliary stent
placement. Pneumobilia confirming stent patency. Unchanged
pancreatic ductal dilatation.
3. No evidence of metastatic disease.
4.  Aortic Atherosclerosis ([LZ]-[LZ]).

ADDENDUM:
Upon subsequent review the following vascular findings of potential
clinical significance are noted:

-(image 60/4) replaced RIGHT hepatic arterial supply arises from the
SMA passing immediately posterior to the tumor. SMA passing
posterior to tumor on image 54/4.

-(image [DATE]) teardrop morphology of the SMV is a finding that is
associated with SMV involvement and is seen in the current case.

These results will be called to the ordering clinician or
representative by the Radiologist Assistant, and communication
documented in the PACS or [REDACTED].

*** End of Addendum ***
FINDINGS: Lower chest: Clear lung bases. No significant pleural or pericardial
effusion.

Hepatobiliary: Interval flexible biliary stent placement. The stent
is relatively inferiorly positioned, but grossly stable compared
with ERCP images [LZ] and patent. There is air and contrast within
the biliary system which is no longer significantly dilated. Mild
nonspecific gallbladder wall thickening. No evidence of calcified
gallstones. There are no focal hepatic lesions to suggest metastatic
disease.

Pancreas: Ill-defined mass in the pancreatic head and uncinate
process is best seen on series 9, measuring up to 1.9 x 2.1 cm on
image 27. Associated pancreatic ductal dilatation up to 8 mm,
similar to previous study. This pancreatic mass causes no arterial
or venous encasement or occlusion.

Spleen: Normal in size without focal abnormality.

Adrenals/Urinary Tract: Both adrenal glands appear normal. Small
renal cysts. No evidence of renal mass, urinary tract calculus or
hydronephrosis. The bladder appears unremarkable.

Stomach/Bowel: Enteric contrast was administered and has passed into
the distal small bowel. There is a small hiatal hernia. The stomach
otherwise appears unremarkable for its degree of distention. No
evidence of bowel wall thickening, distention or surrounding
inflammation. The appendix is immediately inferior to the liver and
appears unremarkable.

Vascular/Lymphatic: There are no enlarged abdominal or pelvic lymph
nodes. Aortic and branch vessel atherosclerosis without evidence of
aneurysm. As above, no evidence of vascular encasement or occlusion
by the pancreatic mass. The portal, superior mesenteric and splenic
veins are patent.

Reproductive: The prostate gland and seminal vesicles appear
unremarkable.

Other: No ascites or peritoneal nodularity. The abdominal wall
appears intact.

Musculoskeletal: No acute or significant osseous findings. Mild
lumbar spine facet arthropathy. The sacroiliac joints are partially
ankylosed bilaterally.
IMPRESSION: 1. Grossly stable ill-defined mass within the pancreatic head and
uncinate process, measuring up to 2.1 cm in diameter consistent with
known pancreatic cancer. No evidence of vascular encasement or
occlusion.
2. Resolution of biliary dilatation status post biliary stent
placement. Pneumobilia confirming stent patency. Unchanged
pancreatic ductal dilatation.
3. No evidence of metastatic disease.
4.  Aortic Atherosclerosis ([LZ]-[LZ]).

## 2020-12-05 MED ORDER — IOHEXOL 350 MG/ML SOLN
100.0000 mL | Freq: Once | INTRAVENOUS | Status: AC | PRN
  Administered 2020-12-05: 100 mL via INTRAVENOUS

## 2020-12-06 ENCOUNTER — Other Ambulatory Visit: Payer: Self-pay

## 2020-12-06 NOTE — Progress Notes (Signed)
The proposed treatment discussed in conference is for discussion purpose only and is not a binding recommendation.  The patients have not been physically examined, or presented with their treatment options.  Therefore, final treatment plans cannot be decided.  

## 2020-12-08 ENCOUNTER — Other Ambulatory Visit (HOSPITAL_COMMUNITY): Payer: Self-pay

## 2020-12-08 DIAGNOSIS — E440 Moderate protein-calorie malnutrition: Secondary | ICD-10-CM | POA: Diagnosis not present

## 2020-12-08 DIAGNOSIS — C250 Malignant neoplasm of head of pancreas: Secondary | ICD-10-CM | POA: Diagnosis not present

## 2020-12-08 MED ORDER — DRONABINOL 5 MG PO CAPS: 5.0000 mg | ORAL_CAPSULE | Freq: Two times a day (BID) | ORAL | 2 refills | Status: DC

## 2020-12-08 NOTE — Telephone Encounter (Signed)
Will do. Repeat RX has been sent.

## 2020-12-08 NOTE — Telephone Encounter (Signed)
My records show that this was refilled by Dr. Raliegh Ip just 4 days ago.

## 2020-12-14 ENCOUNTER — Other Ambulatory Visit: Payer: Self-pay

## 2020-12-14 ENCOUNTER — Ambulatory Visit (HOSPITAL_COMMUNITY)
Admission: RE | Admit: 2020-12-14 | Discharge: 2020-12-14 | Disposition: A | Discharge status: Home / Self Care | Payer: BC Managed Care – PPO | Source: Ambulatory Visit | Attending: Hematology | Admitting: Hematology

## 2020-12-14 ENCOUNTER — Telehealth (HOSPITAL_COMMUNITY): Payer: Self-pay | Admitting: Dietician

## 2020-12-14 ENCOUNTER — Encounter (HOSPITAL_COMMUNITY): Payer: BC Managed Care – PPO | Admitting: Dietician

## 2020-12-14 DIAGNOSIS — K8689 Other specified diseases of pancreas: Secondary | ICD-10-CM | POA: Insufficient documentation

## 2020-12-14 DIAGNOSIS — C259 Malignant neoplasm of pancreas, unspecified: Secondary | ICD-10-CM | POA: Diagnosis not present

## 2020-12-14 IMAGING — CT NM PET TUM IMG INITIAL (PI) SKULL BASE T - THIGH
1 of 7 series · 1 of 25 positions shown · non-contrast
Comparison: CT of the abdomen and pelvis [DATE].

CLINICAL DATA: Initial treatment strategy for pancreatic cancer
staging in a 65-year-old male.

EXAM:
NUCLEAR MEDICINE PET SKULL BASE TO THIGH
TECHNIQUE: 9.52 mCi F-18 FDG was injected intravenously. Full-ring PET imaging
was performed from the skull base to thigh after the radiotracer. CT
data was obtained and used for attenuation correction and anatomic
localization.
Fasting blood glucose: 130 for mg/dl

[Series 3: ctac · axial · 3.0mm · 0.98mm/px · 1 of 352 slices shown]
[im 352/352  brain]
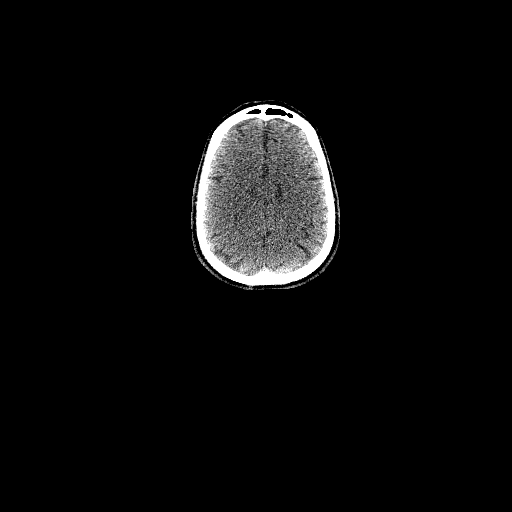

[1 of 25 positions shown; findings below may reference images not displayed]

FINDINGS: Mediastinal blood pool activity: SUV max

Liver activity: SUV max NA

NECK: No hypermetabolic lymph nodes in the neck.

Incidental CT findings: none

CHEST: No hypermetabolic mediastinal or hilar nodes. No suspicious
pulmonary nodules on the CT scan.

Incidental CT findings: Aortic atherosclerosis and 3 vessel coronary
artery disease. Heart size normal without pericardial effusion.
Esophagus grossly normal. Lungs are clear. Airways are patent.

ABDOMEN/PELVIS: Hypermetabolic mass in the pancreatic head with a
maximum SUV of 5.3. Stent traverses the common bile duct in the
intrapancreatic portion as on the recent CT evaluation. Findings of
note from recent CT include replaced RIGHT hepatic artery arising
from the SMA anterior drop shape of SMV. Small lymph nodes in the
area without significant metabolic activity. No sign of focal,
suspicious hepatic lesion or focal metabolic activity in the liver.
No solid organ uptake or nodal disease.

Incidental CT findings: Cholelithiasis with distended gallbladder.
Pneumobilia biliary stent in place traversing pancreatic portion of
common bile duct and the site of pancreatic mass. Peripheral
pancreatic ductal dilation with mild peripancreatic stranding which
is similar to previous imaging. Normal appearance of the spleen,
adrenal glands and kidneys. Decompressed urinary bladder. Small
hiatal hernia. Small bowel is normal caliber. No acute
gastrointestinal process with colonic diverticulosis. Aortic
atherosclerosis without signs of aneurysm. Scattered small
retroperitoneal lymph nodes including in ovoid intra-aortocaval
lymph node measuring 9 mm (image 188/3 maximum SUV of 2.1)

SKELETON: No focal hypermetabolic activity to suggest skeletal
metastasis.

Incidental CT findings: Spinal degenerative changes. No acute or
destructive bone findings.
IMPRESSION: Mass in the pancreatic head with hypermetabolic features, no current
signs of metastatic disease.

Vascular findings of potential significance:

-replaced RIGHT hepatic arterial supply arises from the SMA.

-teardrop morphology of the SMV.

Intra-aortocaval lymph node with mildly rounded morphology but
without increased metabolic activity on FDG PET, attention on
follow-up.

Cholelithiasis.

Aortic Atherosclerosis ([S5]-[S5]).

## 2020-12-14 MED ORDER — FLUDEOXYGLUCOSE F - 18 (FDG) INJECTION
9.5200 | Freq: Once | INTRAVENOUS | Status: AC | PRN
  Administered 2020-12-14: 9.52 via INTRAVENOUS

## 2020-12-14 NOTE — Telephone Encounter (Signed)
Nutrition Assessment   Reason for Assessment: MST   ASSESSMENT: 65 year old male with newly diagnosed pancreatic cancer. Plans for neoadjuvant chemotherapy followed by surgery. Patient followed by [name].   Spoke with patient via telephone, introduced self and services available at [facility]. Patient appreciative of call and is agreeable to telephone visit. Patient reports decreased appetite, someday's "doesn't want anything" but is making himself eat. Patient reports he has been trying to follow a handout given to him a long time ago that outlines food groups and servings/day. Patient is not drinking nutrition supplements, unsure if these would be good to drink with his diabetes. Patient has been prescribed appetite stimulant, he is asking for clarification on when to take. Patient enjoys being outside and is active, has been walking a mile 3-4 days a week.   Nutrition Focused Physical Exam: unable to complete    Medications: Marinol, Levemir, Oxycodone, iron with vit C   Labs: 7/6 - Glucose 160, Alkaline Phosphatase 305 6/18 Hgb A1c - 12.8   Anthropometrics: Last weight 198 lb 9.6 oz on 7/18 decreased 42 lbs (17.5%) in ~4.5 months which is significant for time frame.   Height: 6'2" Weight: 90.1 kg UBW: 240 lb (07/25/20) BMI: 25.5   Estimated Energy Needs  Kcals: 2600-2800 Protein: 135-153 Fluid: 2.7 L   NUTRITION DIAGNOSIS: Unintentional weight loss related to pancreatic cancer as evidenced by reported decreased appetite and 17.5% (42 lb) weight decrease from usual weight in 4.5 months.    MALNUTRITION DIAGNOSIS: Given weight trends and reported decreased energy intake, highly suspect patient to meet criteria for degree of malnutrition in the context of chronic disease, however unable to identify without completion of nutrition focused physical exam   INTERVENTION:  Educated on importance of adequate calorie and protein energy intake to maintain weights, strength,  nutrition Discussed strategies for poor appetite (encouraged small frequent meals and snacks, using phone to set "meal time" reminders every 3 hours while awake, eating high calorie, high protein foods) - will mail handout and snack ideas Discussed ways to add calories and protein to foods, making the most of every bite - will mail handout Take Marinol as prescribed - twice daily with meals (recommended taking ~20-30 minutes before eating) Suggested drinking 1-2 oral nutrition supplements daily for added calories and protein, recommended Ensure Plus/equivalent (350 kcal, 16 grams protein) Would favor adjusting DM meds vs drinking Glucerna as these supplements are less in calories Discussed strategies for blood glucose management (not skipping meals, having protein source with meals/snacks, activity after eating) - will mail handout Contact information provided   MONITORING, EVALUATION, GOAL: Patient will tolerate increased calories and protein to minimize further weight loss    Next Visit: To be scheduled with treatment plan

## 2020-12-15 ENCOUNTER — Other Ambulatory Visit: Payer: Self-pay | Admitting: Radiology

## 2020-12-18 ENCOUNTER — Other Ambulatory Visit: Payer: Self-pay

## 2020-12-18 ENCOUNTER — Encounter (HOSPITAL_COMMUNITY): Payer: Self-pay

## 2020-12-18 ENCOUNTER — Ambulatory Visit (HOSPITAL_COMMUNITY)
Admission: RE | Admit: 2020-12-18 | Discharge: 2020-12-18 | Disposition: A | Discharge status: Home / Self Care | Payer: BC Managed Care – PPO | Source: Ambulatory Visit | Attending: Hematology | Admitting: Hematology

## 2020-12-18 DIAGNOSIS — Z79890 Hormone replacement therapy (postmenopausal): Secondary | ICD-10-CM | POA: Diagnosis not present

## 2020-12-18 DIAGNOSIS — Z794 Long term (current) use of insulin: Secondary | ICD-10-CM | POA: Diagnosis not present

## 2020-12-18 DIAGNOSIS — Z8249 Family history of ischemic heart disease and other diseases of the circulatory system: Secondary | ICD-10-CM | POA: Diagnosis not present

## 2020-12-18 DIAGNOSIS — F17290 Nicotine dependence, other tobacco product, uncomplicated: Secondary | ICD-10-CM | POA: Diagnosis not present

## 2020-12-18 DIAGNOSIS — C259 Malignant neoplasm of pancreas, unspecified: Secondary | ICD-10-CM | POA: Diagnosis not present

## 2020-12-18 DIAGNOSIS — I10 Essential (primary) hypertension: Secondary | ICD-10-CM | POA: Diagnosis not present

## 2020-12-18 DIAGNOSIS — E785 Hyperlipidemia, unspecified: Secondary | ICD-10-CM | POA: Diagnosis not present

## 2020-12-18 DIAGNOSIS — Z79899 Other long term (current) drug therapy: Secondary | ICD-10-CM | POA: Diagnosis not present

## 2020-12-18 DIAGNOSIS — E119 Type 2 diabetes mellitus without complications: Secondary | ICD-10-CM | POA: Diagnosis not present

## 2020-12-18 DIAGNOSIS — Z888 Allergy status to other drugs, medicaments and biological substances status: Secondary | ICD-10-CM | POA: Diagnosis not present

## 2020-12-18 DIAGNOSIS — G4730 Sleep apnea, unspecified: Secondary | ICD-10-CM | POA: Insufficient documentation

## 2020-12-18 DIAGNOSIS — K7689 Other specified diseases of liver: Secondary | ICD-10-CM | POA: Diagnosis not present

## 2020-12-18 DIAGNOSIS — H9193 Unspecified hearing loss, bilateral: Secondary | ICD-10-CM | POA: Insufficient documentation

## 2020-12-18 DIAGNOSIS — Z452 Encounter for adjustment and management of vascular access device: Secondary | ICD-10-CM | POA: Diagnosis not present

## 2020-12-18 DIAGNOSIS — K8689 Other specified diseases of pancreas: Secondary | ICD-10-CM

## 2020-12-18 HISTORY — PX: IR IMAGING GUIDED PORT INSERTION: IMG5740

## 2020-12-18 LAB — GLUCOSE, CAPILLARY: Glucose-Capillary: 154 mg/dL — ABNORMAL HIGH (ref 70–99)

## 2020-12-18 IMAGING — US IR IMAGING GUIDED PORT INSERTION
2 series · 3 of 3 positions shown · non-contrast
Comparison: None.

INDICATION: 65-year-old male with history of pancreatic cancer requiring central
venous access for chemotherapy.

EXAM:
IMPLANTED PORT A CATH PLACEMENT WITH ULTRASOUND AND FLUOROSCOPIC
GUIDANCE

[Series 1: ir fluoro/shunt/fist · 1 of 1 slices shown]
[im 1/1]
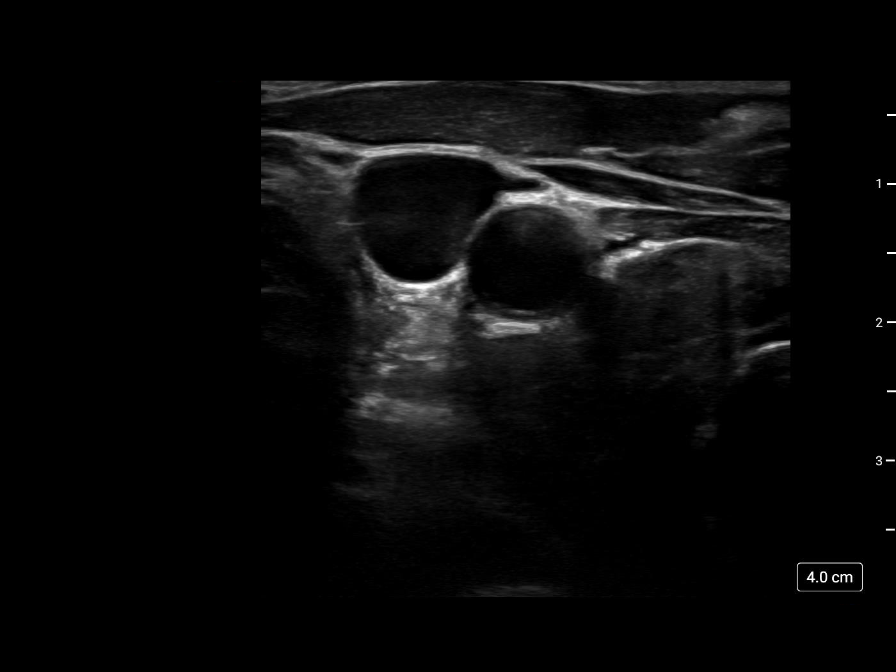

[Series 1: care single · 2 of 2 slices shown]
[im 1/2]
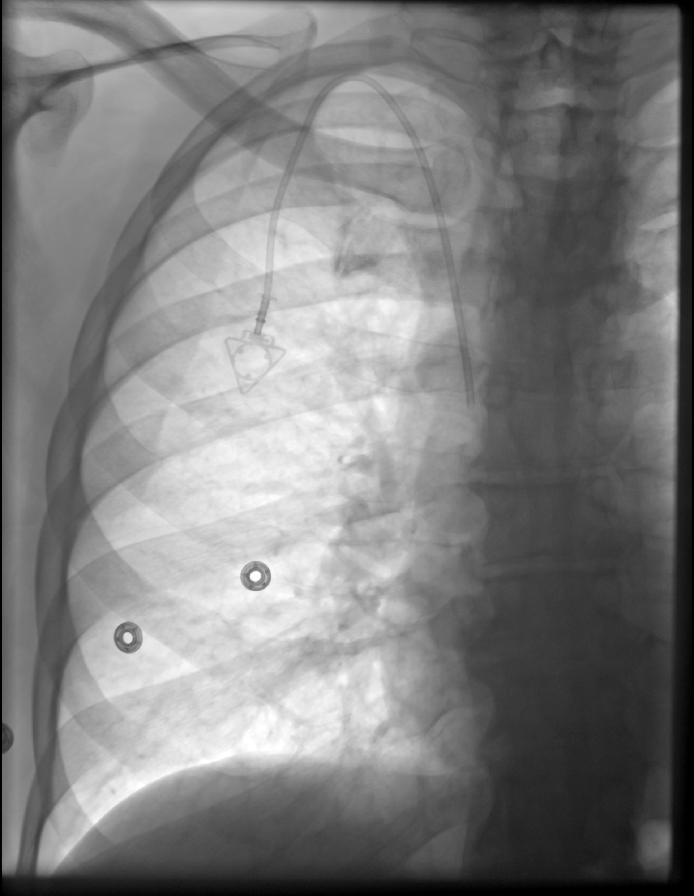
[im 2/2]
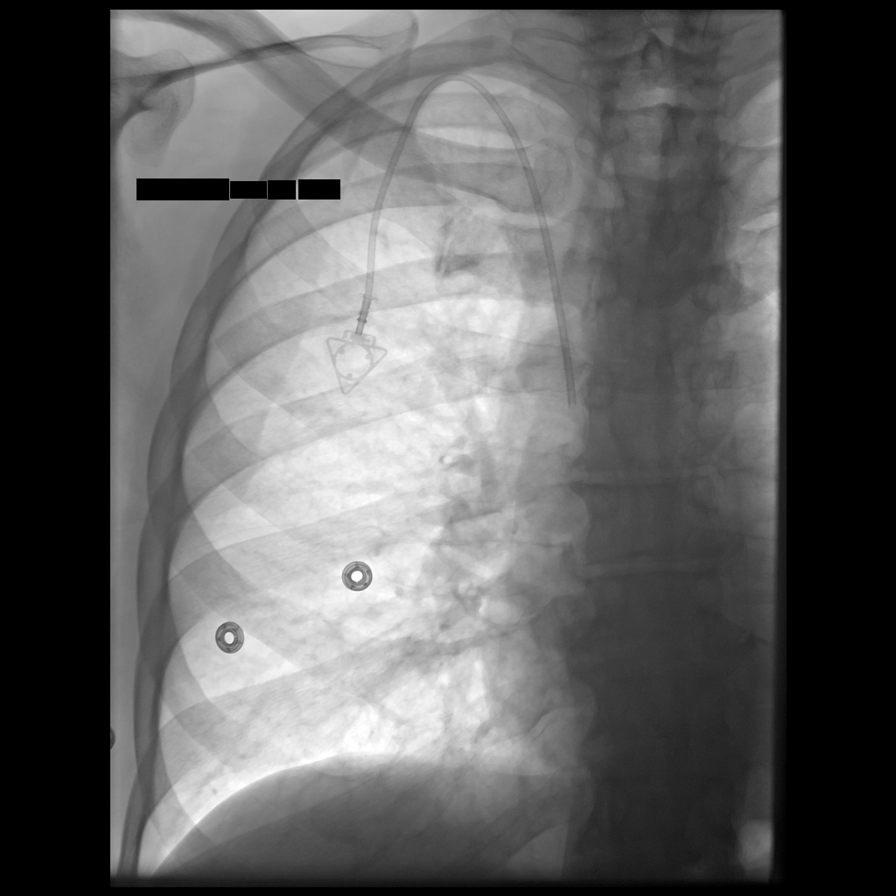

[3 of 3 positions shown; findings below may reference images not displayed]

MEDICATIONS:
None.

ANESTHESIA/SEDATION:
Moderate (conscious) sedation was employed during this procedure. A
total of Versed 1 mg and Fentanyl 100 mcg was administered
intravenously.

Moderate Sedation Time: 21 minutes. The patient's level of
consciousness and vital signs were monitored continuously by
radiology nursing throughout the procedure under my direct
supervision.

CONTRAST:  None

FLUOROSCOPY TIME:  0 minutes, 6 seconds (2 mGy)

COMPLICATIONS:
None immediate.

PROCEDURE:
The procedure, risks, benefits, and alternatives were explained to
the patient. Questions regarding the procedure were encouraged and
answered. The patient understands and consents to the procedure.

The right neck and chest were prepped with chlorhexidine in a
sterile fashion, and a sterile drape was applied covering the
operative field. Maximum barrier sterile technique with sterile
gowns and gloves were used for the procedure. A timeout was
performed prior to the initiation of the procedure.

Ultrasound was used to examine the jugular vein which was
compressible and free of internal echoes. A skin marker was used to
demarcate the planned venotomy and port pocket incision sites. Local
anesthesia was provided to these sites and the subcutaneous tunnel
track with 1% lidocaine with [DATE] epinephrine.

A small incision was created at the jugular access site and blunt
dissection was performed of the subcutaneous tissues. Under
ultrasound guidance, the jugular vein was accessed with a 21 ga
micropuncture needle and an 0.018" wire was inserted to the superior
vena cava. Real-time ultrasound guidance was utilized for vascular
access including the acquisition of a permanent ultrasound image
documenting patency of the accessed vessel. A 5 Fr micopuncture set
was then used, through which a 0.035" Rosen wire was passed under
fluoroscopic guidance into the inferior vena cava. An 8 Fr dilator
was then placed over the wire.

A subcutaneous port pocket was then created along the upper chest
wall utilizing a combination of sharp and blunt dissection. The
pocket was irrigated with sterile saline, packed with gauze, and
observed for hemorrhage. A single lumen "ISP" sized power injectable
port was chosen for placement. The 8 Fr catheter was tunneled from
the port pocket site to the venotomy incision. The port was placed
in the pocket. The external catheter was trimmed to appropriate
length. The dilator was exchanged for an 8 Fr peel-away sheath under
fluoroscopic guidance. The catheter was then placed through the
sheath and the sheath was removed. Final catheter positioning was
confirmed and documented with a fluoroscopic spot radiograph. The
port was accessed with WORTMAN needle, aspirated, and flushed with
heparinized saline.

The deep dermal layer of the port pocket incision was closed with
interrupted 3-0 Vicryl suture. Dermabond was then placed over the
port pocket and neck incisions. The patient tolerated the procedure
well without immediate post procedural complication.
FINDINGS: After catheter placement, the tip lies within the superior
cavoatrial junction. The catheter aspirates and flushes normally and
is ready for immediate use.
IMPRESSION: Successful placement of a power injectable Port-A-Cath via the right
internal jugular vein. The catheter is ready for immediate use.

## 2020-12-18 MED ORDER — FENTANYL CITRATE (PF) 100 MCG/2ML IJ SOLN
INTRAMUSCULAR | Status: AC | PRN
  Administered 2020-12-18 (×2): 50 ug via INTRAVENOUS

## 2020-12-18 MED ORDER — HEPARIN SOD (PORK) LOCK FLUSH 100 UNIT/ML IV SOLN
INTRAVENOUS | Status: AC
  Filled 2020-12-18: qty 5

## 2020-12-18 MED ORDER — LIDOCAINE-EPINEPHRINE 1 %-1:100000 IJ SOLN
INTRAMUSCULAR | Status: AC | PRN
  Administered 2020-12-18 (×2): 10 mL via INTRADERMAL

## 2020-12-18 MED ORDER — MIDAZOLAM HCL 2 MG/2ML IJ SOLN
INTRAMUSCULAR | Status: AC | PRN
Start: 2020-12-18 — End: 2020-12-18
  Administered 2020-12-18: 1 mg via INTRAVENOUS

## 2020-12-18 MED ORDER — HEPARIN SOD (PORK) LOCK FLUSH 100 UNIT/ML IV SOLN
INTRAVENOUS | Status: AC | PRN
  Administered 2020-12-18: 500 [IU] via INTRAVENOUS

## 2020-12-18 MED ORDER — LIDOCAINE-EPINEPHRINE 1 %-1:100000 IJ SOLN
INTRAMUSCULAR | Status: AC
  Filled 2020-12-18: qty 1

## 2020-12-18 MED ORDER — MIDAZOLAM HCL 2 MG/2ML IJ SOLN
INTRAMUSCULAR | Status: AC
  Filled 2020-12-18: qty 4

## 2020-12-18 MED ORDER — FENTANYL CITRATE (PF) 100 MCG/2ML IJ SOLN
INTRAMUSCULAR | Status: AC
  Filled 2020-12-18: qty 2

## 2020-12-18 MED ORDER — SODIUM CHLORIDE 0.9 % IV SOLN: INTRAVENOUS | Status: DC

## 2020-12-18 NOTE — Consult Note (Signed)
Chief Complaint: Patient was seen in consultation today for Port-A-Cath placement  Referring Physician(s): Katragadda,Sreedhar  Supervising Physician: Ruthann Cancer  Patient Status: [facility] - Out-pt  History of Present Illness: Alexander Banks is a 65 y.o. male smoker with history of iron deficiency anemia, diabetes, hearing loss, hyperlipidemia, hypertension, sleep apnea and newly diagnosed pancreatic cancer with recent biliary  stent placement.  He presents today for Port-A-Cath placement for chemotherapy.  Past Medical History:  Diagnosis Date   Anemia, iron deficiency    Diabetes mellitus without complication ([place])    on meds   Hearing loss    Bil hearing aids   Hyperlipidemia    Hypertension    Prediabetes    Sleep apnea     Past Surgical History:  Procedure Laterality Date   BILIARY STENT PLACEMENT N/A 11/09/2020   Procedure: BILIARY STENT PLACEMENT - 84 F X 5 CM STENT;  Surgeon: Rogene Houston, MD;  Location: [facility];  Service: Endoscopy;  Laterality: N/A;   COLONOSCOPY     ERCP N/A 11/09/2020   Procedure: ENDOSCOPIC RETROGRADE CHOLANGIOPANCREATOGRAPHY (ERCP);  Surgeon: Rogene Houston, MD;  Location: [facility];  Service: Endoscopy;  Laterality: N/A;   ESOPHAGOGASTRODUODENOSCOPY (EGD) WITH PROPOFOL N/A 11/30/2020   Procedure: ESOPHAGOGASTRODUODENOSCOPY (EGD) WITH PROPOFOL;  Surgeon: Milus Banister, MD;  Location: [facility];  Service: Endoscopy;  Laterality: N/A;   EUS N/A 11/30/2020   Procedure: UPPER ENDOSCOPIC ULTRASOUND (EUS) RADIAL;  Surgeon: Milus Banister, MD;  Location: [facility];  Service: Endoscopy;  Laterality: N/A;   FINE NEEDLE ASPIRATION N/A 11/30/2020   Procedure: FINE NEEDLE ASPIRATION (FNA) LINEAR;  Surgeon: Milus Banister, MD;  Location: [facility];  Service: Endoscopy;  Laterality: N/A;   GIVENS CAPSULE STUDY     KNEE SURGERY     4 surgeries on left, 2 on right knee   SHOULDER SURGERY     right shoulder   SPHINCTEROTOMY N/A 11/09/2020    Procedure: SHORT BILIARY SPHINCTEROTOMY;  Surgeon: Rogene Houston, MD;  Location: [facility];  Service: Endoscopy;  Laterality: N/A;    Allergies: Lisinopril  Medications: Prior to Admission medications   Medication Sig Start Date End Date Taking? Authorizing Provider  doxycycline (VIBRA-TABS) 100 MG tablet Take 1 tablet (100 mg total) by mouth 2 (two) times daily. 11/17/20  Yes [name], MD  dronabinol (MARINOL) 5 MG capsule Take 1 capsule (5 mg total) by mouth 2 (two) times daily before a meal. 12/08/20  Yes Pennington, Rebekah M, PA-C  Glucosamine-Chondroitin (OSTEO BI-FLEX REGULAR STRENGTH PO) Take 2 tablets by mouth daily.   Yes [provider]  ibuprofen (ADVIL) 200 MG tablet Take 400 mg by mouth every 6 (six) hours as needed (back pain).   Yes [provider]  insulin detemir (LEVEMIR) 100 UNIT/ML FlexPen Inject 40 Units into the skin at bedtime. 11/22/20  Yes Reardon, Juanetta Beets, NP  insulin lispro (HUMALOG KWIKPEN) 100 UNIT/ML KwikPen Inject 14-20 Units into the skin 3 (three) times daily before meals. Patient taking differently: Inject 12-15 Units into the skin 3 (three) times daily before meals. 11/22/20  Yes Reardon, Juanetta Beets, NP  IRON-VITAMIN C PO Take 1 tablet by mouth daily.   Yes [provider]  levothyroxine (SYNTHROID) 175 MCG tablet Take 1 tablet (175 mcg total) by mouth daily before breakfast. 11/10/20  Yes [name], [name], MD  Melatonin 1 MG/4ML LIQD Take by mouth. Not sure of mg taken   Yes [provider]  mometasone (ELOCON) 0.1 % cream Apply 1 application topically 2 (two) times daily as needed. 11/17/20  Yes [name], MD  oxyCODONE-acetaminophen (PERCOCET/ROXICET) 5-325 MG tablet Take 1 tablet by mouth every 6 (six) hours as needed for severe pain.   Yes [provider]  rosuvastatin (CRESTOR) 40 MG tablet TAKE 1 TABLET DAILY (STOP ATORVASTATIN) Patient taking differently: Take 40 mg by mouth daily. (STOP  ATORVASTATIN) 07/25/20  Yes Luking, Elayne Snare, MD  sildenafil (VIAGRA) 100 MG tablet Take 0.5-1 tablets (50-100 mg total) by mouth daily as needed for erectile dysfunction. 11/17/20  Yes [name], MD  Continuous Blood Gluc Receiver (FREESTYLE LIBRE 2 READER) DEVI As directed 11/13/20   Cassandria Anger, MD  Continuous Blood Gluc Sensor (FREESTYLE LIBRE 2 SENSOR) MISC 1 Piece by Does not apply route every 14 (fourteen) days. 11/22/20   Brita Romp, NP  glucose blood (ONETOUCH VERIO) test strip TEST ONE TIME DAILY 11/30/19   [name], MD  OneTouch Delica Lancets [id number] MISC TEST ONE TIME DAILY 11/30/19   [name], MD     Family History  Problem Relation Age of Onset   Heart attack Father    Heart attack Paternal Grandfather    Leukemia Sister    Heart attack Brother     Social History   Socioeconomic History   Marital status: Married    Spouse name: Not on file   Number of children: Not on file   Years of education: Not on file   Highest education level: Not on file  Occupational History   Not on file  Tobacco Use   Smoking status: Every Day    Types: Cigars   Smokeless tobacco: Never   Tobacco comments:    Smoke 3-4 cigars a day  Vaping Use   Vaping Use: Never used  Substance and Sexual Activity   Alcohol use: Yes    Comment: occasional   Drug use: Never   Sexual activity: Not on file  Other Topics Concern   Not on file  Social History Narrative   Not on file   Social Determinants of Health   Financial Resource Strain: Not on file  Food Insecurity: Not on file  Transportation Needs: Not on file  Physical Activity: Inactive   Days of Exercise per Week: 0 days   Minutes of Exercise per Session: 0 min  Stress: Not on file  Social Connections: Not on file      Review of Systems currently denies fever, headache, chest pain, dyspnea, cough, abdominal pain, nausea, vomiting or bleeding.  He does have some intermittent back discomfort  Vital  Signs: BP (!) 146/82   Pulse 87   Temp 98.2 F (36.8 C) (Oral)   Resp 16   Ht '6\' 2"'$  (1.88 m)   Wt 192 lb (87.1 kg)   SpO2 98%   BMI 24.65 kg/m   Physical Exam awake, alert.  Chest clear to auscultation bilaterally.  Heart with regular rate and rhythm.  Abdomen soft, positive bowel sounds, nontender.  No lower extremity edema.  Imaging: CT ABDOMEN PELVIS W WO CONTRAST  Addendum Date: 12/16/2020   ADDENDUM REPORT: 12/16/2020 09:14 ADDENDUM: Upon subsequent review the following vascular findings of potential clinical significance are noted: -(image 60/4) replaced RIGHT hepatic arterial supply arises from the SMA passing immediately posterior to the tumor. SMA passing posterior to tumor on image 54/4. -(image 25/9) teardrop morphology of the SMV is a finding that is associated with  SMV involvement and is seen in the current case. These results will be called to the ordering clinician or representative by the Radiologist Assistant, and communication documented in the PACS or [employer]. Electronically Signed   By: Zetta Bills M.D.   On: 12/16/2020 09:14   Result Date: 12/16/2020 CLINICAL DATA:  Recent diagnosis of pancreatic cancer.  Staging. EXAM: CT ABDOMEN AND PELVIS WITHOUT AND WITH CONTRAST TECHNIQUE: Multidetector CT imaging of the abdomen and pelvis was performed following the standard protocol before and following the bolus administration of intravenous contrast. CONTRAST:  138m OMNIPAQUE IOHEXOL 350 MG/ML SOLN COMPARISON:  MRCP 11/08/2020.  Pelvic CT 11/06/2020. FINDINGS: Lower chest: Clear lung bases. No significant pleural or pericardial effusion. Hepatobiliary: Interval flexible biliary stent placement. The stent is relatively inferiorly positioned, but grossly stable compared with ERCP images [id number] and patent. There is air and contrast within the biliary system which is no longer significantly dilated. Mild nonspecific gallbladder wall thickening. No evidence of calcified  gallstones. There are no focal hepatic lesions to suggest metastatic disease. Pancreas: Ill-defined mass in the pancreatic head and uncinate process is best seen on series 9, measuring up to 1.9 x 2.1 cm on image 27. Associated pancreatic ductal dilatation up to 8 mm, similar to previous study. This pancreatic mass causes no arterial or venous encasement or occlusion. Spleen: Normal in size without focal abnormality. Adrenals/Urinary Tract: Both adrenal glands appear normal. Small renal cysts. No evidence of renal mass, urinary tract calculus or hydronephrosis. The bladder appears unremarkable. Stomach/Bowel: Enteric contrast was administered and has passed into the distal small bowel. There is a small hiatal hernia. The stomach otherwise appears unremarkable for its degree of distention. No evidence of bowel wall thickening, distention or surrounding inflammation. The appendix is immediately inferior to the liver and appears unremarkable. Vascular/Lymphatic: There are no enlarged abdominal or pelvic lymph nodes. Aortic and branch vessel atherosclerosis without evidence of aneurysm. As above, no evidence of vascular encasement or occlusion by the pancreatic mass. The portal, superior mesenteric and splenic veins are patent. Reproductive: The prostate gland and seminal vesicles appear unremarkable. Other: No ascites or peritoneal nodularity. The abdominal wall appears intact. Musculoskeletal: No acute or significant osseous findings. Mild lumbar spine facet arthropathy. The sacroiliac joints are partially ankylosed bilaterally. IMPRESSION: 1. Grossly stable ill-defined mass within the pancreatic head and uncinate process, measuring up to 2.1 cm in diameter consistent with known pancreatic cancer. No evidence of vascular encasement or occlusion. 2. Resolution of biliary dilatation status post biliary stent placement. Pneumobilia confirming stent patency. Unchanged pancreatic ductal dilatation. 3. No evidence of  metastatic disease. 4.  Aortic Atherosclerosis (ICD10-I70.0). Electronically Signed: By: WRichardean SaleM.D. On: 12/06/2020 08:27   NM PET Image Initial (PI) Skull Base To Thigh  Result Date: 12/16/2020 CLINICAL DATA:  Initial treatment strategy for pancreatic cancer staging in a 65year old male. EXAM: NUCLEAR MEDICINE PET SKULL BASE TO THIGH TECHNIQUE: 9.52 mCi F-18 FDG was injected intravenously. Full-ring PET imaging was performed from the skull base to thigh after the radiotracer. CT data was obtained and used for attenuation correction and anatomic localization. Fasting blood glucose: 130 for mg/dl COMPARISON:  CT of the abdomen and pelvis of December 05, 2020. FINDINGS: Mediastinal blood pool activity: SUV max 2.12 Liver activity: SUV max NA NECK: No hypermetabolic lymph nodes in the neck. Incidental CT findings: none CHEST: No hypermetabolic mediastinal or hilar nodes. No suspicious pulmonary nodules on the CT scan. Incidental CT findings: Aortic atherosclerosis and 3 vessel  coronary artery disease. Heart size normal without pericardial effusion. Esophagus grossly normal. Lungs are clear. Airways are patent. ABDOMEN/PELVIS: Hypermetabolic mass in the pancreatic head with a maximum SUV of 5.3. Stent traverses the common bile duct in the intrapancreatic portion as on the recent CT evaluation. Findings of note from recent CT include replaced RIGHT hepatic artery arising from the SMA anterior drop shape of SMV. Small lymph nodes in the area without significant metabolic activity. No sign of focal, suspicious hepatic lesion or focal metabolic activity in the liver. No solid organ uptake or nodal disease. Incidental CT findings: Cholelithiasis with distended gallbladder. Pneumobilia biliary stent in place traversing pancreatic portion of common bile duct and the site of pancreatic mass. Peripheral pancreatic ductal dilation with mild peripancreatic stranding which is similar to previous imaging. Normal appearance  of the spleen, adrenal glands and kidneys. Decompressed urinary bladder. Small hiatal hernia. Small bowel is normal caliber. No acute gastrointestinal process with colonic diverticulosis. Aortic atherosclerosis without signs of aneurysm. Scattered small retroperitoneal lymph nodes including in ovoid intra-aortocaval lymph node measuring 9 mm (image 188/3 maximum SUV of 2.1) SKELETON: No focal hypermetabolic activity to suggest skeletal metastasis. Incidental CT findings: Spinal degenerative changes. No acute or destructive bone findings. IMPRESSION: Mass in the pancreatic head with hypermetabolic features, no current signs of metastatic disease. Vascular findings of potential significance: -replaced RIGHT hepatic arterial supply arises from the SMA. -teardrop morphology of the SMV. Intra-aortocaval lymph node with mildly rounded morphology but without increased metabolic activity on FDG PET, attention on follow-up. Cholelithiasis. Aortic Atherosclerosis (ICD10-I70.0). Electronically Signed   By: Zetta Bills M.D.   On: 12/16/2020 08:38    Labs:  CBC: Recent Labs    11/06/20 2033 11/07/20 0637 11/08/20 0456 11/22/20 1344  WBC 5.4 5.6 5.0 8.1  HGB 12.2* 11.5* 11.7* 12.1*  HCT 36.2* 34.3* 34.4* 36.8*  PLT 378 396 400 361    COAGS: Recent Labs    11/07/20 0637  INR 1.0  APTT 27    BMP: Recent Labs    06/02/20 0812 11/03/20 0808 11/07/20 0702 11/07/20 1027 11/08/20 0456 11/10/20 0619 11/22/20 1344  NA 139   < > 136 135 136  --  141  K 5.0   < > 3.5 3.6 3.5  --  5.0  CL 103   < > 105 105 106  --  103  CO2 21   < > '23 24 22  '$ --  21  GLUCOSE 182*   < > 151* 171* 268*  --  160*  BUN 15   < > '20 18 14  '$ --  15  CALCIUM 9.3   < > 8.7* 8.6* 8.7*  --  8.5*  CREATININE 1.06   < > 1.44* 1.45* 1.42* 1.39* 0.91  GFRNONAA 75   < > 55* 54* 56* 57*  --   GFRAA 87  --   --   --   --   --   --    < > = values in this interval not displayed.    LIVER FUNCTION TESTS: Recent Labs     11/08/20 0456 11/09/20 0501 11/10/20 0619 11/22/20 1344  BILITOT 1.1 1.1 0.9 0.3  AST 111* 79* 26 23  ALT 198* 166* 110* 42  ALKPHOS 508* 543* 446* 305*  PROT 6.1* 6.1* 5.9* 6.7  ALBUMIN 2.7* 2.7* 2.6* 3.9    TUMOR MARKERS: No results for input(s): AFPTM, CEA, CA199, CHROMGRNA in the last 8760 hours.  Assessment and Plan: 65  y.o. male smoker with history of iron deficiency anemia, diabetes, hearing loss, hyperlipidemia, hypertension, sleep apnea and newly diagnosed pancreatic cancer with recent biliary stent placement.  He presents today for Port-A-Cath placement for chemotherapy.Risks and benefits of image guided port-a-catheter placement was discussed with the patient including, but not limited to bleeding, infection, pneumothorax, or fibrin sheath development and need for additional procedures.  All of the patient's questions were answered, patient is agreeable to proceed. Consent signed and in chart.    Thank you for this interesting consult.  I greatly enjoyed meeting Alexander Banks and look forward to participating in their care.  A copy of this report was sent to the requesting provider on this date.  Electronically Signed: D. Rowe Robert, PA-C 12/18/2020, 12:28 PM   I spent a total of  25 minutes   in face to face in clinical consultation, greater than 50% of which was counseling/coordinating care for Port-A-Cath placement

## 2020-12-18 NOTE — Discharge Instructions (Addendum)
Interventional radiology phone numbers [phone] After hours [phone]    You have skin glue (dermabond) over your new port. Do not use the lidocaine cream (EMLA cream) over the skin glue until it has healed. The petroleum in the lidocaine cream will dissolve the skin glue resulting in an infection of your new port. Use ice in a zip lock bag for 1-2 minutes over your new port before the cancer center nurses access your port.   Implanted Port Insertion, Care After This sheet gives you information about how to care for yourself after your procedure. Your health care provider may also give you more specific instructions. If you have problems or questions, contact your health care provider. What can I expect after the procedure? After the procedure, it is common to have: Discomfort at the port insertion site. Bruising on the skin over the port. This should improve over 3-4 days. Follow these instructions at home: Port care After your port is placed, you will get a manufacturer's information card. The card has information about your port. Keep this card with you at all times. Take care of the port as told by your health care provider. Ask your health care provider if you or a family member can get training for taking care of the port at home. A home health care nurse may also take care of the port. Make sure to remember what type of port you have. Incision care Follow instructions from your health care provider about how to take care of your port insertion site. Make sure you: Wash your hands with soap and water before and after you change your bandage (dressing). If soap and water are not available, use hand sanitizer. Change your dressing as told by your health care provider. Leave skin glue in place. These skin closures may need to stay in place for 2 weeks or longer.  Check your port insertion site every day for signs of infection. Check for: Redness, swelling, or pain. Fluid or  blood. Warmth. Pus or a bad smell.      Activity Return to your normal activities as told by your health care provider. Ask your health care provider what activities are safe for you. Do not lift anything that is heavier than 10 lb (4.5 kg), or the limit that you are told, until your health care provider says that it is safe. General instructions Take over-the-counter and prescription medicines only as told by your health care provider. Do not take baths, swim, or use a hot tub until your health care provider approves.You may remove your dressing tomorrow and shower 24 hours after your procedure. Do not drive for 24 hours if you were given a sedative during your procedure. Wear a medical alert bracelet in case of an emergency. This will tell any health care providers that you have a port. Keep all follow-up visits as told by your health care provider. This is important. Contact a health care provider if: You cannot flush your port with saline as directed, or you cannot draw blood from the port. You have a fever or chills. You have redness, swelling, or pain around your port insertion site. You have fluid or blood coming from your port insertion site. Your port insertion site feels warm to the touch. You have pus or a bad smell coming from the port insertion site. Get help right away if: You have chest pain or shortness of breath. You have bleeding from your port that you cannot control. Summary Take care of   the port as told by your health care provider. Keep the manufacturer's information card with you at all times. Change your dressing as told by your health care provider. Contact a health care provider if you have a fever or chills or if you have redness, swelling, or pain around your port insertion site. Keep all follow-up visits as told by your health care provider. This information is not intended to replace advice given to you by your health care provider. Make sure you discuss any  questions you have with your health care provider.     Moderate Conscious Sedation, Adult, Care After This sheet gives you information about how to care for yourself after your procedure. Your health care provider may also give you more specific instructions. If you have problems or questions, contact your health care provider. What can I expect after the procedure? After the procedure, it is common to have: Sleepiness for several hours. Impaired judgment for several hours. Difficulty with balance. Vomiting if you eat too soon. Follow these instructions at home: For the time period you were told by your health care provider: Rest. Do not participate in activities where you could fall or become injured. Do not drive or use machinery. Do not drink alcohol. Do not take sleeping pills or medicines that cause drowsiness. Do not make important decisions or sign legal documents. Do not take care of children on your own.      Eating and drinking Follow the diet recommended by your health care provider. Drink enough fluid to keep your urine pale yellow. If you vomit: Drink water, juice, or soup when you can drink without vomiting. Make sure you have little or no nausea before eating solid foods.   General instructions Take over-the-counter and prescription medicines only as told by your health care provider. Have a responsible adult stay with you for the time you are told. It is important to have someone help care for you until you are awake and alert. Do not smoke. Keep all follow-up visits as told by your health care provider. This is important. Contact a health care provider if: You are still sleepy or having trouble with balance after 24 hours. You feel light-headed. You keep feeling nauseous or you keep vomiting. You develop a rash. You have a fever. You have redness or swelling around the IV site. Get help right away if: You have trouble breathing. You have new-onset  confusion at home. Summary After the procedure, it is common to feel sleepy, have impaired judgment, or feel nauseous if you eat too soon. Rest after you get home. Know the things you should not do after the procedure. Follow the diet recommended by your health care provider and drink enough fluid to keep your urine pale yellow. Get help right away if you have trouble breathing or new-onset confusion at home. This information is not intended to replace advice given to you by your health care provider. Make sure you discuss any questions you have with your health care provider. Document Revised: 09/03/2019 Document Reviewed: 04/01/2019 Elsevier Patient Education  2021 [employer]   

## 2020-12-18 NOTE — Progress Notes (Signed)
Spoke with Diane from [facility] radiology concerning addendum to CT Abdomen and Pelvis from 12/05/20.   ADDENDUM: Upon subsequent review the following vascular findings of potential clinical significance are noted:   -(image 60/4) replaced RIGHT hepatic arterial supply arises from the SMA passing immediately posterior to the tumor. SMA passing posterior to tumor on image 54/4.   -(image 25/9) teardrop morphology of the SMV is a finding that is associated with SMV involvement and is seen in the current case.  [name] is aware. No further action at this time.

## 2020-12-18 NOTE — Procedures (Signed)
Interventional Radiology Procedure Note ° °Procedure: Single Lumen Power Port Placement   ° °Access:  Right internal jugular vein ° °Findings: Catheter tip positioned at cavoatrial junction. Port is ready for immediate use.  ° °Complications: None ° °EBL: < 10 mL ° °Recommendations:  °- Ok to shower in 24 hours °- Do not submerge for 7 days °- Routine line care  ° ° °Boston Cookson, MD ° ° ° °

## 2020-12-19 NOTE — Progress Notes (Signed)
Alexander Banks, Alexander Banks   CLINIC:  Medical Oncology/Hematology  PCP:  [name], MD [address] [place] / [place] [address] [phone]   REASON FOR VISIT:  Follow-up for pancreatic adenocarcinoma  PRIOR THERAPY: none  NGS Results: not done  CURRENT THERAPY: surveillance  BRIEF ONCOLOGIC HISTORY:  Oncology History   No history exists.    CANCER STAGING: Cancer Staging Pancreatic adenocarcinoma [facility]) Staging form: Exocrine Pancreas, AJCC 8th Edition - Clinical stage from 12/04/2020: Stage IB (cT2, cN0, cM0) - Unsigned   INTERVAL HISTORY:  Alexander Banks, a 65 y.o. male, returns for routine follow-up of his pancreatic adenocarcinoma. Alexander Banks was last seen on 12/04/20.   Today he reports feeling well, and he is agreeable to beginning chemotherapy next week.   REVIEW OF SYSTEMS:  Review of Systems  Constitutional:  Positive for appetite change (40%) and fatigue (60%).  Musculoskeletal:  Positive for back pain (6/10).  All other systems reviewed and are negative.  PAST MEDICAL/SURGICAL HISTORY:  Past Medical History:  Diagnosis Date   Anemia, iron deficiency    Diabetes mellitus without complication ([place])    on meds   Hearing loss    Bil hearing aids   Hyperlipidemia    Hypertension    Prediabetes    Sleep apnea    Past Surgical History:  Procedure Laterality Date   BILIARY STENT PLACEMENT N/A 11/09/2020   Procedure: BILIARY STENT PLACEMENT - 16 F X 5 CM STENT;  Surgeon: Rogene Houston, MD;  Location: [facility];  Service: Endoscopy;  Laterality: N/A;   COLONOSCOPY     ERCP N/A 11/09/2020   Procedure: ENDOSCOPIC RETROGRADE CHOLANGIOPANCREATOGRAPHY (ERCP);  Surgeon: Rogene Houston, MD;  Location: [facility];  Service: Endoscopy;  Laterality: N/A;   ESOPHAGOGASTRODUODENOSCOPY (EGD) WITH PROPOFOL N/A 11/30/2020   Procedure: ESOPHAGOGASTRODUODENOSCOPY (EGD) WITH PROPOFOL;  Surgeon: Milus Banister, MD;   Location: [facility];  Service: Endoscopy;  Laterality: N/A;   EUS N/A 11/30/2020   Procedure: UPPER ENDOSCOPIC ULTRASOUND (EUS) RADIAL;  Surgeon: Milus Banister, MD;  Location: [facility];  Service: Endoscopy;  Laterality: N/A;   FINE NEEDLE ASPIRATION N/A 11/30/2020   Procedure: FINE NEEDLE ASPIRATION (FNA) LINEAR;  Surgeon: Milus Banister, MD;  Location: [facility];  Service: Endoscopy;  Laterality: N/A;   GIVENS CAPSULE STUDY     IR IMAGING GUIDED PORT INSERTION  12/18/2020   KNEE SURGERY     4 surgeries on left, 2 on right knee   SHOULDER SURGERY     right shoulder   SPHINCTEROTOMY N/A 11/09/2020   Procedure: SHORT BILIARY SPHINCTEROTOMY;  Surgeon: Rogene Houston, MD;  Location: [facility];  Service: Endoscopy;  Laterality: N/A;    SOCIAL HISTORY:  Social History   Socioeconomic History   Marital status: Married    Spouse name: Not on file   Number of children: Not on file   Years of education: Not on file   Highest education level: Not on file  Occupational History   Not on file  Tobacco Use   Smoking status: Every Day    Types: Cigars   Smokeless tobacco: Never   Tobacco comments:    Smoke 3-4 cigars a day  Vaping Use   Vaping Use: Never used  Substance and Sexual Activity   Alcohol use: Yes    Comment: occasional   Drug use: Never   Sexual activity: Not on file  Other Topics  Concern   Not on file  Social History Narrative   Not on file   Social Determinants of Health   Financial Resource Strain: Low Risk    Difficulty of Paying Living Expenses: Not hard at all  Food Insecurity: No Food Insecurity   Worried About Charity fundraiser in the Last Year: Never true   [place] in the Last Year: Never true  Transportation Needs: No Transportation Needs   Lack of Transportation (Medical): No   Lack of Transportation (Non-Medical): No  Physical Activity: Sufficiently Active   Days of Exercise per Week: 5 days   Minutes of Exercise per Session: 30 min   Stress: Not on file  Social Connections: Not on file  Intimate Partner Violence: Not At Risk   Fear of Current or Ex-Partner: No   Emotionally Abused: No   Physically Abused: No   Sexually Abused: No    FAMILY HISTORY:  Family History  Problem Relation Age of Onset   Heart attack Father    Heart attack Paternal Grandfather    Leukemia Sister    Heart attack Brother     CURRENT MEDICATIONS:  Current Outpatient Medications  Medication Sig Dispense Refill   Continuous Blood Gluc Receiver (FREESTYLE LIBRE 2 READER) DEVI As directed 1 each 0   Continuous Blood Gluc Sensor (FREESTYLE LIBRE 2 SENSOR) MISC 1 Piece by Does not apply route every 14 (fourteen) days. 6 each 2   dronabinol (MARINOL) 5 MG capsule Take 1 capsule (5 mg total) by mouth 2 (two) times daily before a meal. 60 capsule 2   Glucosamine-Chondroitin (OSTEO BI-FLEX REGULAR STRENGTH PO) Take 2 tablets by mouth daily.     ibuprofen (ADVIL) 200 MG tablet Take 400 mg by mouth every 6 (six) hours as needed (back pain).     insulin detemir (LEVEMIR) 100 UNIT/ML FlexPen Inject 40 Units into the skin at bedtime. 45 mL 0   insulin lispro (HUMALOG KWIKPEN) 100 UNIT/ML KwikPen Inject 14-20 Units into the skin 3 (three) times daily before meals. (Patient taking differently: Inject 12-15 Units into the skin 3 (three) times daily before meals.) 45 mL 0   IRON-VITAMIN C PO Take 1 tablet by mouth daily.     levothyroxine (SYNTHROID) 175 MCG tablet Take 1 tablet (175 mcg total) by mouth daily before breakfast.     Melatonin 1 MG/4ML LIQD Take by mouth. Not sure of mg taken     oxyCODONE-acetaminophen (PERCOCET/ROXICET) 5-325 MG tablet Take 1 tablet by mouth every 6 (six) hours as needed for severe pain.     rosuvastatin (CRESTOR) 40 MG tablet TAKE 1 TABLET DAILY (STOP ATORVASTATIN) (Patient taking differently: Take 40 mg by mouth daily. (STOP ATORVASTATIN)) 90 tablet 1   sildenafil (VIAGRA) 100 MG tablet Take 0.5-1 tablets (50-100 mg total)  by mouth daily as needed for erectile dysfunction. 5 tablet 11   OneTouch Delica Lancets 68E MISC TEST ONE TIME DAILY 100 each 11   No current facility-administered medications for this visit.    ALLERGIES:  Allergies  Allergen Reactions   Lisinopril Cough    PHYSICAL EXAM:  Performance status (ECOG): 0 - Asymptomatic  There were no vitals filed for this visit. Wt Readings from Last 3 Encounters:  12/18/20 192 lb (87.1 kg)  12/04/20 198 lb 9.6 oz (90.1 kg)  11/30/20 196 lb (88.9 kg)   Physical Exam Vitals reviewed.  Constitutional:      Appearance: Normal appearance.     Comments: R  port-a-cath placed  Cardiovascular:     Rate and Rhythm: Normal rate and regular rhythm.     Pulses: Normal pulses.     Heart sounds: Normal heart sounds.  Pulmonary:     Effort: Pulmonary effort is normal.     Breath sounds: Normal breath sounds.  Neurological:     General: No focal deficit present.     Mental Status: He is alert and oriented to person, place, and time.  Psychiatric:        Mood and Affect: Mood normal.        Behavior: Behavior normal.     LABORATORY DATA:  I have reviewed the labs as listed.  CBC Latest Ref Rng & Units 11/22/2020 11/08/2020 11/07/2020  WBC 3.4 - 10.8 x10E3/uL 8.1 5.0 5.6  Hemoglobin 13.0 - 17.7 g/dL 12.1(L) 11.7(L) 11.5(L)  Hematocrit 37.5 - 51.0 % 36.8(L) 34.4(L) 34.3(L)  Platelets 150 - 450 x10E3/uL 361 400 396   CMP Latest Ref Rng & Units 11/22/2020 11/10/2020 11/09/2020  Glucose 65 - 99 mg/dL 160(H) - -  BUN 8 - 27 mg/dL 15 - -  Creatinine 0.76 - 1.27 mg/dL 0.91 1.39(H) -  Sodium 134 - 144 mmol/L 141 - -  Potassium 3.5 - 5.2 mmol/L 5.0 - -  Chloride 96 - 106 mmol/L 103 - -  CO2 20 - 29 mmol/L 21 - -  Calcium 8.6 - 10.2 mg/dL 8.5(L) - -  Total Protein 6.0 - 8.5 g/dL 6.7 5.9(L) 6.1(L)  Total Bilirubin 0.0 - 1.2 mg/dL 0.3 0.9 1.1  Alkaline Phos 44 - 121 IU/L 305(H) 446(H) 543(H)  AST 0 - 40 IU/L 23 26 79(H)  ALT 0 - 44 IU/L 42 110(H) 166(H)     DIAGNOSTIC IMAGING:  I have independently reviewed the scans and discussed with the patient. CT ABDOMEN PELVIS W WO CONTRAST  Addendum Date: 12/16/2020   ADDENDUM REPORT: 12/16/2020 09:14 ADDENDUM: Upon subsequent review the following vascular findings of potential clinical significance are noted: -(image 60/4) replaced RIGHT hepatic arterial supply arises from the SMA passing immediately posterior to the tumor. SMA passing posterior to tumor on image 54/4. -(image 25/9) teardrop morphology of the SMV is a finding that is associated with SMV involvement and is seen in the current case. These results will be called to the ordering clinician or representative by the Radiologist Assistant, and communication documented in the PACS or [employer]. Electronically Signed   By: Zetta Bills M.D.   On: 12/16/2020 09:14   Result Date: 12/16/2020 CLINICAL DATA:  Recent diagnosis of pancreatic cancer.  Staging. EXAM: CT ABDOMEN AND PELVIS WITHOUT AND WITH CONTRAST TECHNIQUE: Multidetector CT imaging of the abdomen and pelvis was performed following the standard protocol before and following the bolus administration of intravenous contrast. CONTRAST:  170m OMNIPAQUE IOHEXOL 350 MG/ML SOLN COMPARISON:  MRCP 11/08/2020.  Pelvic CT 11/06/2020. FINDINGS: Lower chest: Clear lung bases. No significant pleural or pericardial effusion. Hepatobiliary: Interval flexible biliary stent placement. The stent is relatively inferiorly positioned, but grossly stable compared with ERCP images [id number] and patent. There is air and contrast within the biliary system which is no longer significantly dilated. Mild nonspecific gallbladder wall thickening. No evidence of calcified gallstones. There are no focal hepatic lesions to suggest metastatic disease. Pancreas: Ill-defined mass in the pancreatic head and uncinate process is best seen on series 9, measuring up to 1.9 x 2.1 cm on image 27. Associated pancreatic ductal dilatation up  to 8 mm, similar to previous study.  This pancreatic mass causes no arterial or venous encasement or occlusion. Spleen: Normal in size without focal abnormality. Adrenals/Urinary Tract: Both adrenal glands appear normal. Small renal cysts. No evidence of renal mass, urinary tract calculus or hydronephrosis. The bladder appears unremarkable. Stomach/Bowel: Enteric contrast was administered and has passed into the distal small bowel. There is a small hiatal hernia. The stomach otherwise appears unremarkable for its degree of distention. No evidence of bowel wall thickening, distention or surrounding inflammation. The appendix is immediately inferior to the liver and appears unremarkable. Vascular/Lymphatic: There are no enlarged abdominal or pelvic lymph nodes. Aortic and branch vessel atherosclerosis without evidence of aneurysm. As above, no evidence of vascular encasement or occlusion by the pancreatic mass. The portal, superior mesenteric and splenic veins are patent. Reproductive: The prostate gland and seminal vesicles appear unremarkable. Other: No ascites or peritoneal nodularity. The abdominal wall appears intact. Musculoskeletal: No acute or significant osseous findings. Mild lumbar spine facet arthropathy. The sacroiliac joints are partially ankylosed bilaterally. IMPRESSION: 1. Grossly stable ill-defined mass within the pancreatic head and uncinate process, measuring up to 2.1 cm in diameter consistent with known pancreatic cancer. No evidence of vascular encasement or occlusion. 2. Resolution of biliary dilatation status post biliary stent placement. Pneumobilia confirming stent patency. Unchanged pancreatic ductal dilatation. 3. No evidence of metastatic disease. 4.  Aortic Atherosclerosis (ICD10-I70.0). Electronically Signed: By: Richardean Sale M.D. On: 12/06/2020 08:27   NM PET Image Initial (PI) Skull Base To Thigh  Result Date: 12/16/2020 CLINICAL DATA:  Initial treatment strategy for pancreatic  cancer staging in a 65 year old male. EXAM: NUCLEAR MEDICINE PET SKULL BASE TO THIGH TECHNIQUE: 9.52 mCi F-18 FDG was injected intravenously. Full-ring PET imaging was performed from the skull base to thigh after the radiotracer. CT data was obtained and used for attenuation correction and anatomic localization. Fasting blood glucose: 130 for mg/dl COMPARISON:  CT of the abdomen and pelvis of December 05, 2020. FINDINGS: Mediastinal blood pool activity: SUV max 2.12 Liver activity: SUV max NA NECK: No hypermetabolic lymph nodes in the neck. Incidental CT findings: none CHEST: No hypermetabolic mediastinal or hilar nodes. No suspicious pulmonary nodules on the CT scan. Incidental CT findings: Aortic atherosclerosis and 3 vessel coronary artery disease. Heart size normal without pericardial effusion. Esophagus grossly normal. Lungs are clear. Airways are patent. ABDOMEN/PELVIS: Hypermetabolic mass in the pancreatic head with a maximum SUV of 5.3. Stent traverses the common bile duct in the intrapancreatic portion as on the recent CT evaluation. Findings of note from recent CT include replaced RIGHT hepatic artery arising from the SMA anterior drop shape of SMV. Small lymph nodes in the area without significant metabolic activity. No sign of focal, suspicious hepatic lesion or focal metabolic activity in the liver. No solid organ uptake or nodal disease. Incidental CT findings: Cholelithiasis with distended gallbladder. Pneumobilia biliary stent in place traversing pancreatic portion of common bile duct and the site of pancreatic mass. Peripheral pancreatic ductal dilation with mild peripancreatic stranding which is similar to previous imaging. Normal appearance of the spleen, adrenal glands and kidneys. Decompressed urinary bladder. Small hiatal hernia. Small bowel is normal caliber. No acute gastrointestinal process with colonic diverticulosis. Aortic atherosclerosis without signs of aneurysm. Scattered small  retroperitoneal lymph nodes including in ovoid intra-aortocaval lymph node measuring 9 mm (image 188/3 maximum SUV of 2.1) SKELETON: No focal hypermetabolic activity to suggest skeletal metastasis. Incidental CT findings: Spinal degenerative changes. No acute or destructive bone findings. IMPRESSION: Mass in the pancreatic head with  hypermetabolic features, no current signs of metastatic disease. Vascular findings of potential significance: -replaced RIGHT hepatic arterial supply arises from the SMA. -teardrop morphology of the SMV. Intra-aortocaval lymph node with mildly rounded morphology but without increased metabolic activity on FDG PET, attention on follow-up. Cholelithiasis. Aortic Atherosclerosis (ICD10-I70.0). Electronically Signed   By: Zetta Bills M.D.   On: 12/16/2020 08:38   IR IMAGING GUIDED PORT INSERTION  Result Date: 12/18/2020 INDICATION: 64 year old male with history of pancreatic cancer requiring central venous access for chemotherapy. EXAM: IMPLANTED PORT A CATH PLACEMENT WITH ULTRASOUND AND FLUOROSCOPIC GUIDANCE COMPARISON:  None. MEDICATIONS: None. ANESTHESIA/SEDATION: Moderate (conscious) sedation was employed during this procedure. A total of Versed 1 mg and Fentanyl 100 mcg was administered intravenously. Moderate Sedation Time: 21 minutes. The patient's level of consciousness and vital signs were monitored continuously by radiology nursing throughout the procedure under my direct supervision. CONTRAST:  None FLUOROSCOPY TIME:  0 minutes, 6 seconds (2 mGy) COMPLICATIONS: None immediate. PROCEDURE: The procedure, risks, benefits, and alternatives were explained to the patient. Questions regarding the procedure were encouraged and answered. The patient understands and consents to the procedure. The right neck and chest were prepped with chlorhexidine in a sterile fashion, and a sterile drape was applied covering the operative field. Maximum barrier sterile technique with sterile gowns  and gloves were used for the procedure. A timeout was performed prior to the initiation of the procedure. Ultrasound was used to examine the jugular vein which was compressible and free of internal echoes. A skin marker was used to demarcate the planned venotomy and port pocket incision sites. Local anesthesia was provided to these sites and the subcutaneous tunnel track with 1% lidocaine with 1:100,000 epinephrine. A small incision was created at the jugular access site and blunt dissection was performed of the subcutaneous tissues. Under ultrasound guidance, the jugular vein was accessed with a 21 ga micropuncture needle and an 0.018" wire was inserted to the superior vena cava. Real-time ultrasound guidance was utilized for vascular access including the acquisition of a permanent ultrasound image documenting patency of the accessed vessel. A 5 Fr micopuncture set was then used, through which a 0.035" Rosen wire was passed under fluoroscopic guidance into the inferior vena cava. An 8 Fr dilator was then placed over the wire. A subcutaneous port pocket was then created along the upper chest wall utilizing a combination of sharp and blunt dissection. The pocket was irrigated with sterile saline, packed with gauze, and observed for hemorrhage. A single lumen "ISP" sized power injectable port was chosen for placement. The 8 Fr catheter was tunneled from the port pocket site to the venotomy incision. The port was placed in the pocket. The external catheter was trimmed to appropriate length. The dilator was exchanged for an 8 Fr peel-away sheath under fluoroscopic guidance. The catheter was then placed through the sheath and the sheath was removed. Final catheter positioning was confirmed and documented with a fluoroscopic spot radiograph. The port was accessed with a Huber needle, aspirated, and flushed with heparinized saline. The deep dermal layer of the port pocket incision was closed with interrupted 3-0 Vicryl  suture. Dermabond was then placed over the port pocket and neck incisions. The patient tolerated the procedure well without immediate post procedural complication. FINDINGS: After catheter placement, the tip lies within the superior cavoatrial junction. The catheter aspirates and flushes normally and is ready for immediate use. IMPRESSION: Successful placement of a power injectable Port-A-Cath via the right internal jugular vein. The catheter  is ready for immediate use. Ruthann Cancer, MD [facility] Electronically Signed   By: Ruthann Cancer MD   On: 12/18/2020 14:35     ASSESSMENT:  1.  Stage I (T2N0) pancreatic adenocarcinoma: - Recent admission to the hospital with DKA, found to have elevated liver enzymes. - MRCP without contrast on 11/08/2020 showed 2.3 x 2 cm central pancreatic head and uncinate mass with intra and extrahepatic biliary ductal dilation. - 11/09/2020-ERCP sphincterotomy and stenting with findings showing short high-grade distal CBD stricture with dilated CBD up to 14 mm.  Bile duct brushing was negative for malignant cells. - 11/10/2020-CA 19.9-367. - ERCP/EUS by [name] on 11/30/2020 with 2.9 cm mass in the head of the pancreas with unclear outer borders.  Given significant dilatation, indistinct borders, unable to confidently assess major vascular involvement.  Abutment and probable invasion of SMV and portal vein near the confluence with splenic vein.  No obvious SMA or celiac artery involvement.  No other adenopathy. - FNA of the pancreatic mass consistent with adenocarcinoma. - Weight loss intentional 25 pounds between January and April 1.  Additional 25 pound weight loss since 1 April, unintentional. - CT [facility] pancreatic protocol on 12/05/2020 with ill-defined mass in the pancreatic head and uncinate process measuring up to 1.9 x 2.1 cm with associated pancreatic ductal dilatation up to 8 mm.  No enlarged adenopathy.  Teardrop  morphology of SMV indicative of SMV involvement.  Replaced right hepatic arterial supply arises from the SMA passing immediately posterior to the tumor. - PET scan on 12/14/2020 with mass in the pancreatic head with hypermetabolic features, SUV 5.3 with no sign of metastatic disease.  2.  Social/family history: - He lives at home with his wife and is independent of all ADLs and IADLs. - He currently works in [employer].  No history of chemical exposure. - He previously smoked cigarettes.  Currently smokes cigars 4/day. - Maternal grandmother died at age 19 with malignancy, unknown type. - Sister died of acute leukemia.   PLAN:   1.  Stage I (T2N0) pancreatic adenocarcinoma: - We discussed images and reports of PET CT scan and CT pancreatic protocol with the patient and his wife in detail. - We have discussed his case in the tumor board.  He was also evaluated by Dr. Barry Dienes.  I have reviewed her notes. - He was recommended to have neoadjuvant chemotherapy followed by repeat imaging with a CT pancreatic protocol. - We discussed neoadjuvant chemotherapy with modified FOLFIRINOX regimen every 2 weeks for at least 3 months followed by repeat imaging. - We discussed side effects in detail including cytopenias, increased risk of infections, diarrhea, abdominal cramping, cold sensitivity, peripheral neuropathy, mucositis, hand-foot skin reaction among others. - Because of high risk of neutropenia, will consider giving primary prophylaxis with G-CSF. - He has port placed on Monday.  We will schedule him for first cycle of chemotherapy next week. - I will see him back prior to his cycle 2. - We would recommend germline mutation testing for BRCA/PALB 2 mutations.  2.  Weight loss: - Continue Marinol 5 mg twice daily. - Counseled him the importance of maintaining calorie intake and not losing weight during treatments.  Suggested nutritional supplements like Glucerna.  3.  Perirectal abscess: - He does not  have any symptoms of infection at this time.  4.  Diabetes: - He has diabetes for 5 to 6 years with no neuropathy. - Continue Humalog 15 units with breakfast  and lunch and 13 units with dinner.  Continue 40 units of Levemir at bedtime.  We will closely monitor sugars during treatments.  5.  Paraspinal low back pain: - Continue Percocet 5/320 5/2 tablet as needed.   Orders placed this encounter:  No orders of the defined types were placed in this encounter.    Derek Jack, MD [place] [phone]   I, Thana Ates, am acting as a scribe for Dr. Derek Jack.  I, Derek Jack MD, have reviewed the above documentation for accuracy and completeness, and I agree with the above.

## 2020-12-20 ENCOUNTER — Encounter (HOSPITAL_COMMUNITY): Payer: Self-pay | Admitting: Hematology

## 2020-12-20 ENCOUNTER — Other Ambulatory Visit: Payer: Self-pay

## 2020-12-20 ENCOUNTER — Inpatient Hospital Stay (HOSPITAL_COMMUNITY): Payer: BC Managed Care – PPO | Attending: Hematology and Oncology | Admitting: Hematology

## 2020-12-20 DIAGNOSIS — M545 Low back pain, unspecified: Secondary | ICD-10-CM | POA: Diagnosis not present

## 2020-12-20 DIAGNOSIS — K611 Rectal abscess: Secondary | ICD-10-CM | POA: Diagnosis not present

## 2020-12-20 DIAGNOSIS — Z5111 Encounter for antineoplastic chemotherapy: Secondary | ICD-10-CM | POA: Insufficient documentation

## 2020-12-20 DIAGNOSIS — E119 Type 2 diabetes mellitus without complications: Secondary | ICD-10-CM | POA: Diagnosis not present

## 2020-12-20 DIAGNOSIS — N281 Cyst of kidney, acquired: Secondary | ICD-10-CM | POA: Diagnosis not present

## 2020-12-20 DIAGNOSIS — Z79899 Other long term (current) drug therapy: Secondary | ICD-10-CM | POA: Insufficient documentation

## 2020-12-20 DIAGNOSIS — M47816 Spondylosis without myelopathy or radiculopathy, lumbar region: Secondary | ICD-10-CM | POA: Diagnosis not present

## 2020-12-20 DIAGNOSIS — C250 Malignant neoplasm of head of pancreas: Secondary | ICD-10-CM | POA: Diagnosis not present

## 2020-12-20 DIAGNOSIS — M549 Dorsalgia, unspecified: Secondary | ICD-10-CM | POA: Diagnosis not present

## 2020-12-20 DIAGNOSIS — F17290 Nicotine dependence, other tobacco product, uncomplicated: Secondary | ICD-10-CM | POA: Insufficient documentation

## 2020-12-20 DIAGNOSIS — Z888 Allergy status to other drugs, medicaments and biological substances status: Secondary | ICD-10-CM | POA: Insufficient documentation

## 2020-12-20 DIAGNOSIS — I2510 Atherosclerotic heart disease of native coronary artery without angina pectoris: Secondary | ICD-10-CM | POA: Diagnosis not present

## 2020-12-20 DIAGNOSIS — R5383 Other fatigue: Secondary | ICD-10-CM | POA: Insufficient documentation

## 2020-12-20 DIAGNOSIS — R109 Unspecified abdominal pain: Secondary | ICD-10-CM | POA: Diagnosis not present

## 2020-12-20 DIAGNOSIS — K449 Diaphragmatic hernia without obstruction or gangrene: Secondary | ICD-10-CM | POA: Insufficient documentation

## 2020-12-20 DIAGNOSIS — K8021 Calculus of gallbladder without cholecystitis with obstruction: Secondary | ICD-10-CM | POA: Insufficient documentation

## 2020-12-20 DIAGNOSIS — Z8249 Family history of ischemic heart disease and other diseases of the circulatory system: Secondary | ICD-10-CM | POA: Insufficient documentation

## 2020-12-20 DIAGNOSIS — E785 Hyperlipidemia, unspecified: Secondary | ICD-10-CM | POA: Insufficient documentation

## 2020-12-20 DIAGNOSIS — C259 Malignant neoplasm of pancreas, unspecified: Secondary | ICD-10-CM | POA: Diagnosis not present

## 2020-12-20 DIAGNOSIS — I700 Atherosclerosis of aorta: Secondary | ICD-10-CM | POA: Insufficient documentation

## 2020-12-20 DIAGNOSIS — I10 Essential (primary) hypertension: Secondary | ICD-10-CM | POA: Diagnosis not present

## 2020-12-20 DIAGNOSIS — Z806 Family history of leukemia: Secondary | ICD-10-CM | POA: Diagnosis not present

## 2020-12-20 MED ORDER — LIDOCAINE-PRILOCAINE 2.5-2.5 % EX CREA: TOPICAL_CREAM | CUTANEOUS | Status: DC

## 2020-12-20 NOTE — Progress Notes (Signed)
START ON PATHWAY REGIMEN - Pancreatic Adenocarcinoma ° ° °  A cycle is every 14 days: °    Oxaliplatin  °    Leucovorin  °    Irinotecan  °    Fluorouracil  ° °**Always confirm dose/schedule in your pharmacy ordering system** ° °Patient Characteristics: °Preoperative (Clinical Staging), Borderline Resectable, PS = 0,1, BRCA1/2 and PALB2 Mutation Absent/Unknown °Therapeutic Status: Preoperative (Clinical Staging) °AJCC T Category: cT2 °AJCC N Category: cN0 °Resectability Status: Borderline Resectable °AJCC M Category: cM0 °AJCC 8 Stage Grouping: IB °ECOG Performance Status: 0 °BRCA1/2 Mutation Status: Awaiting Test Results °PALB2 Mutation Status: Awaiting Test Results °Intent of Therapy: °Curative Intent, Discussed with Patient °

## 2020-12-20 NOTE — Patient Instructions (Addendum)
[place] [facility] Discharge Instructions  You were seen and examined today by [name]. [name] reviewed your recent PET scan. Good news - the cancer is localized to your pancreas and the goal is to surgically remove it after 3 months of chemotherapy treatment!  [name] has recommended a chemotherapy regimen known as FOLFIRINOX. This is a combination of chemotherapy medications that work together to shrink the size of the tumor prior to surgery.  Follow-up as scheduled.   Thank you for choosing [place] [facility] to provide your oncology and hematology care.  To afford each patient quality time with our provider, please arrive at least 15 minutes before your scheduled appointment time.   If you have a lab appointment with the [facility] please come in thru the Main Entrance and check in at the main information desk.  You need to re-schedule your appointment should you arrive 10 or more minutes late.  We strive to give you quality time with our providers, and arriving late affects you and other patients whose appointments are after yours.  Also, if you no show three or more times for appointments you may be dismissed from the clinic at the providers discretion.     Again, thank you for choosing [facility].  Our hope is that these requests will decrease the amount of time that you wait before being seen by our physicians.       _____________________________________________________________  Should you have questions after your visit to [facility], please contact our office at [phone] and follow the prompts.  Our office hours are 8:00 a.m. and 4:30 p.m. Monday - Friday.  Please note that voicemails left after 4:00 p.m. may not be returned until the following business day.  We are closed weekends and major holidays.  You do have access to a nurse 24-7, just call the main number to the  clinic [phone] and do not press any options, hold on the line and a nurse will answer the phone.    For prescription refill requests, have your pharmacy contact our office and allow 72 hours.    Due to Covid, you will need to wear a mask upon entering the hospital. If you do not have a mask, a mask will be given to you at the Main Entrance upon arrival. For doctor visits, patients may have 1 support person age 43 or older with them. For treatment visits, patients can not have anyone with them due to social distancing guidelines and our immunocompromised population.

## 2020-12-21 ENCOUNTER — Other Ambulatory Visit (HOSPITAL_COMMUNITY): Payer: BC Managed Care – PPO

## 2020-12-21 ENCOUNTER — Ambulatory Visit (INDEPENDENT_AMBULATORY_CARE_PROVIDER_SITE_OTHER): Payer: BC Managed Care – PPO | Admitting: Gastroenterology

## 2020-12-21 ENCOUNTER — Encounter (INDEPENDENT_AMBULATORY_CARE_PROVIDER_SITE_OTHER): Payer: Self-pay | Admitting: Internal Medicine

## 2020-12-21 ENCOUNTER — Ambulatory Visit (INDEPENDENT_AMBULATORY_CARE_PROVIDER_SITE_OTHER): Payer: BC Managed Care – PPO | Admitting: Internal Medicine

## 2020-12-21 VITALS — BP 104/71 | HR 101 | Temp 99.3°F | Ht 74.5 in | Wt 197.0 lb

## 2020-12-21 DIAGNOSIS — C259 Malignant neoplasm of pancreas, unspecified: Secondary | ICD-10-CM | POA: Diagnosis not present

## 2020-12-21 DIAGNOSIS — K831 Obstruction of bile duct: Secondary | ICD-10-CM | POA: Insufficient documentation

## 2020-12-21 NOTE — Progress Notes (Addendum)
Presenting complaint;  Patient with pancreatic adenocarcinoma resulting in biliary obstruction treated with biliary stenting.  Database and subjective:  Patient is 65 year old Caucasian male who is here for scheduled visit.  I initially saw him when he was hospitalized for DKA and noted to have abnormal LFTs. Imaging suggested pancreatic head mass resulting in biliary obstruction(CT, ultrasound and MR).  He underwent ERCP with brushing cytology and placement of plastic biliary stent on 11/09/2020.  Bile duct cytology was negative. He underwent endoscopic ultrasound by [name] on 11/30/2020 with FNA.  Findings are reviewed below.  FNA was positive for adenocarcinoma.  He was seen by [name] of oncolgy and also by [name] at [facility] surgery.  Patient's condition was felt to be resectable.  Decision was made for preop chemotherapy for 3 months.  He had Port-A-Cath placed 3 days ago.  He is to start chemotherapy in 1 week consisting of oxaliplatin, leucovorin, irinotecan and fluorouracil every 2 weeks.  Patient states he has stopped losing weight.  His weight has not changed over the last 3 weeks.  All in all he has lost more than 50 pounds.  His appetite is not good but he tries to lead III meals a day.  He has sporadic fleeting pain in right upper quadrant but it does not last for more than few seconds.  No history of nausea vomiting heartburn dysphagia fever or chills.  His stool frequency has improved.  He is not having every day.  He denies melena or rectal bleeding. He is having 2-3 episodes of low blood glucose every week.  These episodes occur at different times.  He has an appointment to see Dr. Dorris Fetch in 2 weeks. Patient states he takes ibuprofen 2 tablets at bedtime to help with restless sleep.  He has taken half a tablet of oxycodone on occasions but not in the last 2 weeks.  Current Medications: Outpatient Encounter Medications as of 12/21/2020  Medication Sig    Continuous Blood Gluc Receiver (FREESTYLE LIBRE 2 READER) DEVI As directed   Continuous Blood Gluc Sensor (FREESTYLE LIBRE 2 SENSOR) MISC 1 Piece by Does not apply route every 14 (fourteen) days.   dronabinol (MARINOL) 5 MG capsule Take 1 capsule (5 mg total) by mouth 2 (two) times daily before a meal.   Glucosamine-Chondroitin (OSTEO BI-FLEX REGULAR STRENGTH PO) Take 2 tablets by mouth daily.   ibuprofen (ADVIL) 200 MG tablet Take 400 mg by mouth every 6 (six) hours as needed (back pain).   insulin detemir (LEVEMIR) 100 UNIT/ML FlexPen Inject 40 Units into the skin at bedtime.   insulin lispro (HUMALOG KWIKPEN) 100 UNIT/ML KwikPen Inject 14-20 Units into the skin 3 (three) times daily before meals. (Patient taking differently: Inject 12-15 Units into the skin 3 (three) times daily before meals.)   IRON-VITAMIN C PO Take 1 tablet by mouth daily.   levothyroxine (SYNTHROID) 175 MCG tablet Take 1 tablet (175 mcg total) by mouth daily before breakfast.   Melatonin-Pyridoxine (MELATIN PO) Take by mouth. Takes 2 qhs   OneTouch Delica Lancets 40J MISC TEST ONE TIME DAILY   oxyCODONE-acetaminophen (PERCOCET/ROXICET) 5-325 MG tablet Take 1 tablet by mouth every 6 (six) hours as needed for severe pain.   rosuvastatin (CRESTOR) 40 MG tablet TAKE 1 TABLET DAILY (STOP ATORVASTATIN) (Patient taking differently: Take 40 mg by mouth daily. (STOP ATORVASTATIN))   sildenafil (VIAGRA) 100 MG tablet Take 0.5-1 tablets (50-100 mg total) by mouth daily as needed for erectile dysfunction.   [  DISCONTINUED] Melatonin 1 MG/4ML LIQD Take by mouth. Not sure of mg taken   No facility-administered encounter medications on file as of 12/21/2020.    Objective: Blood pressure 104/71, pulse (!) 101, temperature 99.3 F (37.4 C), temperature source Oral, height 6' 2.5" (1.892 m), weight 197 lb (89.4 kg). Patient is alert and in no acute distress. Is wearing a mask. Conjunctiva is pink. Sclera is nonicteric Oropharyngeal mucosa  is normal. No neck masses or thyromegaly noted. Cardiac exam with regular rhythm normal S1 and S2. No murmur or gallop noted. Lungs are clear to auscultation. Abdomen is symmetrical soft and nontender without organomegaly or masses. No LE edema or clubbing noted.  Labs/studies Results:   CBC Latest Ref Rng & Units 11/22/2020 11/08/2020 11/07/2020  WBC 3.4 - 10.8 x10E3/uL 8.1 5.0 5.6  Hemoglobin 13.0 - 17.7 g/dL 12.1(L) 11.7(L) 11.5(L)  Hematocrit 37.5 - 51.0 % 36.8(L) 34.4(L) 34.3(L)  Platelets 150 - 450 x10E3/uL 361 400 396    CMP Latest Ref Rng & Units 11/22/2020 11/10/2020 11/09/2020  Glucose 65 - 99 mg/dL 160(H) - -  BUN 8 - 27 mg/dL 15 - -  Creatinine 0.76 - 1.27 mg/dL 0.91 1.39(H) -  Sodium 134 - 144 mmol/L 141 - -  Potassium 3.5 - 5.2 mmol/L 5.0 - -  Chloride 96 - 106 mmol/L 103 - -  CO2 20 - 29 mmol/L 21 - -  Calcium 8.6 - 10.2 mg/dL 8.5(L) - -  Total Protein 6.0 - 8.5 g/dL 6.7 5.9(L) 6.1(L)  Total Bilirubin 0.0 - 1.2 mg/dL 0.3 0.9 1.1  Alkaline Phos 44 - 121 IU/L 305(H) 446(H) 543(H)  AST 0 - 40 IU/L 23 26 79(H)  ALT 0 - 44 IU/L 42 110(H) 166(H)    Hepatic Function Latest Ref Rng & Units 11/22/2020 11/10/2020 11/09/2020  Total Protein 6.0 - 8.5 g/dL 6.7 5.9(L) 6.1(L)  Albumin 3.8 - 4.8 g/dL 3.9 2.6(L) 2.7(L)  AST 0 - 40 IU/L 23 26 79(H)  ALT 0 - 44 IU/L 42 110(H) 166(H)  Alk Phosphatase 44 - 121 IU/L 305(H) 446(H) 543(H)  Total Bilirubin 0.0 - 1.2 mg/dL 0.3 0.9 1.1  Bilirubin, Direct 0.0 - 0.2 mg/dL - 0.2 0.4(H)    Endoscopic ultrasound on 12/01/2019  Findings:  1. Indistinct, amorphous soft tissue mass in the head of the pancreas wiOthu vtpearyti eunntclear outer borders. The mass measures about 2.9 cm maximally. The previously placed plastic biliary stent passes adjacent to the pancreatic head mass. The mass is causing significant dilation of the main pancreatic duct up to 6 mm in the body and tail. The previous extrahepatic biliary dilation has resolved with placement of  the biliary stent. Given the indistinct outer borders I was unable to confidently assess major vascular involvement but I think there is abutment and probable invasion of the SMV and portal vein near the confluence with the splenic vein. There was not obvious SMA or celiac artery involvement. I performed fine-needle aspiration using a 25-gauge EUS FNA needle, 3 transduodenal passes with a Cytotec present. 2. No peripancreatic adenopathy 3. The gallbladder was distended and thick-walled. 4. Limited views of the liver, spleen were all normal.  Assessment:  #1.  Pancreatic adenocarcinoma.  He presented with painless jaundice and underwent ERCP with placement of plastic biliary stent 6 weeks ago. Patient is getting ready to begin chemotherapy in 1 week to continue for 3 months followed by surgery by Dr. Barry Dienes. Biliary stent appears to be patent based on his blood work of  4 weeks ago. He can wait another 6 weeks for stent change.  However he is at risk for stent occlusion and cholangitis as a result.  If it occurs during chemotherapy it could cause significant morbidity. Will c confer with Dr. Barry Dienes.  Will place fully covered Wallstent unless she feels otherwise.  #2.  Diabetes mellitus.  He is having 2-3 episodes of hypoglycemia every week.  He will contact Dr. Liliane Channel for his recommendations regarding insulin dosing.  Plan:  Patient will have blood drawn today as recommended by [name]. May consider changing biliary stent before chemotherapy initiated to minimize risk of stent occlusion while he is undergoing chemotherapy which would last for 3 months. Final recommendations will be made after conferring with Dr. Barry Dienes. Office visit as needed.  Addendum  Dr. Barry Dienes returned my call. She is fine with fully covered but it would normally covered biliary stent. I would prefer fully covered stent as removal of his easily accomplished. Will plan stent change before he gets second or  third cycle of chemo.

## 2020-12-21 NOTE — Patient Instructions (Signed)
Physician will contact you with results of blood test when completed. Will confirm with Dr. Barry Dienes before stent change scheduled.

## 2020-12-22 DIAGNOSIS — E039 Hypothyroidism, unspecified: Secondary | ICD-10-CM | POA: Diagnosis not present

## 2020-12-22 DIAGNOSIS — E1122 Type 2 diabetes mellitus with diabetic chronic kidney disease: Secondary | ICD-10-CM | POA: Diagnosis not present

## 2020-12-22 DIAGNOSIS — N1831 Chronic kidney disease, stage 3a: Secondary | ICD-10-CM | POA: Diagnosis not present

## 2020-12-22 DIAGNOSIS — Z794 Long term (current) use of insulin: Secondary | ICD-10-CM | POA: Diagnosis not present

## 2020-12-23 ENCOUNTER — Encounter (HOSPITAL_COMMUNITY): Payer: Self-pay | Admitting: Hematology

## 2020-12-24 ENCOUNTER — Encounter (HOSPITAL_COMMUNITY): Payer: Self-pay | Admitting: Hematology

## 2020-12-25 ENCOUNTER — Encounter (HOSPITAL_COMMUNITY): Payer: Self-pay | Admitting: Hematology

## 2020-12-25 ENCOUNTER — Ambulatory Visit (HOSPITAL_COMMUNITY): Payer: BC Managed Care – PPO | Admitting: Hematology

## 2020-12-25 ENCOUNTER — Telehealth: Payer: Self-pay | Admitting: Family Medicine

## 2020-12-25 NOTE — Telephone Encounter (Signed)
8:10 PM patient called stating that his sugar had ran low 4 separate occasions over the past 20 hours.  Early this morning it was 69 then several times through the day it was 69 He currently is taking 40 units long-acting each evening And is using approximately 15 units short acting with each meal His appetite is doing fairly good He is eating supplemental food when these readings are low  Currently does not need as much insulin as he was having previously I instructed him to reduce the long-acting insulin to 20 units I also instructed him to reduce his short acting for a range between 4 and 10 depending on his readings  He will touch base with endocrinology on Tuesday He has an appointment with them next week He starts chemotherapy this Wednesday  A copy of this message will be forwarded to Dr. Dorris Fetch so that he or his assistant can help assist the patient with adjusting his insulin dosing  I told the patient I would touch base with him by phone in the morning and if he needed my assistance during the night to notify me.  If his low sugar alarm goes off to eat a substantial snack to help bring up the sugar  Warning signs discussed with patient

## 2020-12-26 ENCOUNTER — Encounter (HOSPITAL_COMMUNITY): Payer: Self-pay

## 2020-12-26 ENCOUNTER — Other Ambulatory Visit (HOSPITAL_COMMUNITY)
Admission: RE | Admit: 2020-12-26 | Discharge: 2020-12-26 | Disposition: A | Discharge status: Home / Self Care | Payer: BC Managed Care – PPO | Source: Ambulatory Visit | Attending: Internal Medicine | Admitting: Internal Medicine

## 2020-12-26 ENCOUNTER — Telehealth: Payer: Self-pay | Admitting: Family Medicine

## 2020-12-26 ENCOUNTER — Encounter (HOSPITAL_COMMUNITY): Payer: Self-pay | Admitting: Hematology

## 2020-12-26 ENCOUNTER — Other Ambulatory Visit (INDEPENDENT_AMBULATORY_CARE_PROVIDER_SITE_OTHER): Payer: Self-pay | Admitting: Internal Medicine

## 2020-12-26 DIAGNOSIS — K831 Obstruction of bile duct: Secondary | ICD-10-CM

## 2020-12-26 DIAGNOSIS — C259 Malignant neoplasm of pancreas, unspecified: Secondary | ICD-10-CM | POA: Insufficient documentation

## 2020-12-26 DIAGNOSIS — Z95828 Presence of other vascular implants and grafts: Secondary | ICD-10-CM

## 2020-12-26 HISTORY — DX: Presence of other vascular implants and grafts: Z95.828

## 2020-12-26 LAB — COMPREHENSIVE METABOLIC PANEL
ALT: 46 IU/L — ABNORMAL HIGH (ref 0–44)
AST: 17 IU/L (ref 0–40)
Albumin/Globulin Ratio: 1.3 (ref 1.2–2.2)
Albumin: 3.6 g/dL — ABNORMAL LOW (ref 3.8–4.8)
Alkaline Phosphatase: 217 IU/L — ABNORMAL HIGH (ref 44–121)
BUN/Creatinine Ratio: 16 (ref 10–24)
BUN: 14 mg/dL (ref 8–27)
Bilirubin Total: 0.2 mg/dL (ref 0.0–1.2)
CO2: 22 mmol/L (ref 20–29)
Calcium: 9.1 mg/dL (ref 8.6–10.2)
Chloride: 108 mmol/L — ABNORMAL HIGH (ref 96–106)
Creatinine, Ser: 0.86 mg/dL (ref 0.76–1.27)
Globulin, Total: 2.8 g/dL (ref 1.5–4.5)
Glucose: 114 mg/dL — ABNORMAL HIGH (ref 65–99)
Potassium: 4.4 mmol/L (ref 3.5–5.2)
Sodium: 143 mmol/L (ref 134–144)
Total Protein: 6.4 g/dL (ref 6.0–8.5)
eGFR: 97 mL/min/{1.73_m2} (ref >59)

## 2020-12-26 LAB — GLUTAMIC ACID DECARBOXYLASE AUTO ABS: Glutamic Acid Decarb Ab: <5 U/mL (ref 0.0–5.0)

## 2020-12-26 LAB — T4, FREE: Free T4: 1.22 ng/dL (ref 0.82–1.77)

## 2020-12-26 LAB — VITAMIN D 25 HYDROXY (VIT D DEFICIENCY, FRACTURES): Vit D, 25-Hydroxy: 34.4 ng/mL (ref 30.0–100.0)

## 2020-12-26 LAB — THYROGLOBULIN ANTIBODY: Thyroglobulin Antibody: 33.4 IU/mL — ABNORMAL HIGH (ref 0.0–0.9)

## 2020-12-26 LAB — HEPATIC FUNCTION PANEL
ALT: 168 U/L — ABNORMAL HIGH (ref 0–44)
AST: 365 U/L — ABNORMAL HIGH (ref 15–41)
Albumin: 3.5 g/dL (ref 3.5–5.0)
Alkaline Phosphatase: 339 U/L — ABNORMAL HIGH (ref 38–126)
Bilirubin, Direct: 1 mg/dL — ABNORMAL HIGH (ref 0.0–0.2)
Indirect Bilirubin: 0.6 mg/dL (ref 0.3–0.9)
Total Bilirubin: 1.6 mg/dL — ABNORMAL HIGH (ref 0.3–1.2)
Total Protein: 7 g/dL (ref 6.5–8.1)

## 2020-12-26 LAB — ANTI-ISLET CELL ANTIBODY: Islet Cell Ab: NEGATIVE

## 2020-12-26 LAB — THYROID PEROXIDASE ANTIBODY: Thyroperoxidase Ab SerPl-aCnc: 235 IU/mL — ABNORMAL HIGH (ref 0–34)

## 2020-12-26 LAB — TSH: TSH: 3.35 u[IU]/mL (ref 0.450–4.500)

## 2020-12-26 LAB — CORTISOL-AM, BLOOD: Cortisol - AM: 17.5 ug/dL (ref 6.2–19.4)

## 2020-12-26 MED ORDER — PROCHLORPERAZINE MALEATE 10 MG PO TABS: 10.0000 mg | ORAL_TABLET | Freq: Four times a day (QID) | ORAL | 1 refills | Status: DC | PRN

## 2020-12-26 MED ORDER — ONDANSETRON HCL 8 MG PO TABS: 8.0000 mg | ORAL_TABLET | Freq: Two times a day (BID) | ORAL | 1 refills | Status: DC | PRN

## 2020-12-26 MED ORDER — LIDOCAINE-PRILOCAINE 2.5-2.5 % EX CREA: TOPICAL_CREAM | CUTANEOUS | 3 refills | Status: DC

## 2020-12-26 NOTE — Telephone Encounter (Signed)
Tuesday at 8 AM Spoke with patient-no further low sugar spells during the night and this morning it was 197 Last night he took 20 units of the long-acting instead of 40 units Tonight he plans to go back to doing 30 units of long-acting And will lower his short acting from 15 units to 12 units with meals He will monitor his readings He stated he will touch base with endocrinology He will call if any further troubles  Copy of this message will be forwarded to his endocrinologist

## 2020-12-26 NOTE — Progress Notes (Signed)
Patient just called stating he is having mild pain in RUQ; no fever or chills. He is to have biliary stent changed on 01/08/2021 He is to receive chemotherapy tomorrow. Patient advised to [facility] lab for LFTs now.

## 2020-12-27 ENCOUNTER — Ambulatory Visit (HOSPITAL_COMMUNITY): Payer: BC Managed Care – PPO

## 2020-12-27 ENCOUNTER — Emergency Department (HOSPITAL_COMMUNITY): Payer: BC Managed Care – PPO | Admitting: Anesthesiology

## 2020-12-27 ENCOUNTER — Inpatient Hospital Stay (HOSPITAL_COMMUNITY): Payer: BC Managed Care – PPO

## 2020-12-27 ENCOUNTER — Emergency Department (HOSPITAL_COMMUNITY): Payer: BC Managed Care – PPO

## 2020-12-27 ENCOUNTER — Encounter (HOSPITAL_COMMUNITY)
Admission: EM | Disposition: A | Discharge status: Home / Self Care | Payer: Self-pay | Source: Home / Self Care | Attending: Internal Medicine

## 2020-12-27 ENCOUNTER — Other Ambulatory Visit (INDEPENDENT_AMBULATORY_CARE_PROVIDER_SITE_OTHER): Payer: Self-pay | Admitting: *Deleted

## 2020-12-27 ENCOUNTER — Other Ambulatory Visit: Payer: Self-pay

## 2020-12-27 ENCOUNTER — Inpatient Hospital Stay (HOSPITAL_COMMUNITY)
Admission: EM | Admit: 2020-12-27 | Discharge: 2020-12-30 | DRG: 919 | Disposition: A | Discharge status: Home / Self Care | Payer: BC Managed Care – PPO | Attending: Internal Medicine | Admitting: Internal Medicine

## 2020-12-27 ENCOUNTER — Encounter (HOSPITAL_COMMUNITY): Payer: Self-pay

## 2020-12-27 ENCOUNTER — Ambulatory Visit (HOSPITAL_COMMUNITY)
Admission: RE | Admit: 2020-12-27 | Payer: BC Managed Care – PPO | Source: Home / Self Care | Admitting: Internal Medicine

## 2020-12-27 DIAGNOSIS — Z888 Allergy status to other drugs, medicaments and biological substances status: Secondary | ICD-10-CM

## 2020-12-27 DIAGNOSIS — C259 Malignant neoplasm of pancreas, unspecified: Secondary | ICD-10-CM | POA: Diagnosis not present

## 2020-12-27 DIAGNOSIS — R509 Fever, unspecified: Secondary | ICD-10-CM | POA: Diagnosis not present

## 2020-12-27 DIAGNOSIS — E039 Hypothyroidism, unspecified: Secondary | ICD-10-CM | POA: Diagnosis present

## 2020-12-27 DIAGNOSIS — G4733 Obstructive sleep apnea (adult) (pediatric): Secondary | ICD-10-CM | POA: Diagnosis not present

## 2020-12-27 DIAGNOSIS — K833 Fistula of bile duct: Secondary | ICD-10-CM | POA: Diagnosis not present

## 2020-12-27 DIAGNOSIS — F17290 Nicotine dependence, other tobacco product, uncomplicated: Secondary | ICD-10-CM | POA: Diagnosis present

## 2020-12-27 DIAGNOSIS — Z8249 Family history of ischemic heart disease and other diseases of the circulatory system: Secondary | ICD-10-CM

## 2020-12-27 DIAGNOSIS — I10 Essential (primary) hypertension: Secondary | ICD-10-CM | POA: Diagnosis present

## 2020-12-27 DIAGNOSIS — T85590A Other mechanical complication of bile duct prosthesis, initial encounter: Secondary | ICD-10-CM | POA: Diagnosis not present

## 2020-12-27 DIAGNOSIS — K8021 Calculus of gallbladder without cholecystitis with obstruction: Secondary | ICD-10-CM | POA: Diagnosis present

## 2020-12-27 DIAGNOSIS — B961 Klebsiella pneumoniae [K. pneumoniae] as the cause of diseases classified elsewhere: Secondary | ICD-10-CM | POA: Diagnosis not present

## 2020-12-27 DIAGNOSIS — E785 Hyperlipidemia, unspecified: Secondary | ICD-10-CM | POA: Diagnosis not present

## 2020-12-27 DIAGNOSIS — M47816 Spondylosis without myelopathy or radiculopathy, lumbar region: Secondary | ICD-10-CM | POA: Diagnosis not present

## 2020-12-27 DIAGNOSIS — A419 Sepsis, unspecified organism: Secondary | ICD-10-CM | POA: Diagnosis present

## 2020-12-27 DIAGNOSIS — R000 Tachycardia, unspecified: Secondary | ICD-10-CM | POA: Diagnosis not present

## 2020-12-27 DIAGNOSIS — B9620 Unspecified Escherichia coli [E. coli] as the cause of diseases classified elsewhere: Secondary | ICD-10-CM | POA: Diagnosis not present

## 2020-12-27 DIAGNOSIS — A4151 Sepsis due to Escherichia coli [E. coli]: Secondary | ICD-10-CM | POA: Diagnosis not present

## 2020-12-27 DIAGNOSIS — K7200 Acute and subacute hepatic failure without coma: Secondary | ICD-10-CM | POA: Diagnosis not present

## 2020-12-27 DIAGNOSIS — G4730 Sleep apnea, unspecified: Secondary | ICD-10-CM | POA: Diagnosis present

## 2020-12-27 DIAGNOSIS — Z79890 Hormone replacement therapy (postmenopausal): Secondary | ICD-10-CM | POA: Diagnosis not present

## 2020-12-27 DIAGNOSIS — Z794 Long term (current) use of insulin: Secondary | ICD-10-CM | POA: Diagnosis not present

## 2020-12-27 DIAGNOSIS — K831 Obstruction of bile duct: Secondary | ICD-10-CM | POA: Diagnosis not present

## 2020-12-27 DIAGNOSIS — C250 Malignant neoplasm of head of pancreas: Secondary | ICD-10-CM | POA: Diagnosis present

## 2020-12-27 DIAGNOSIS — Y732 Prosthetic and other implants, materials and accessory gastroenterology and urology devices associated with adverse incidents: Secondary | ICD-10-CM | POA: Diagnosis present

## 2020-12-27 DIAGNOSIS — E1165 Type 2 diabetes mellitus with hyperglycemia: Secondary | ICD-10-CM | POA: Diagnosis not present

## 2020-12-27 DIAGNOSIS — Z20822 Contact with and (suspected) exposure to covid-19: Secondary | ICD-10-CM | POA: Diagnosis not present

## 2020-12-27 DIAGNOSIS — Z09 Encounter for follow-up examination after completed treatment for conditions other than malignant neoplasm: Secondary | ICD-10-CM

## 2020-12-27 DIAGNOSIS — R6520 Severe sepsis without septic shock: Secondary | ICD-10-CM | POA: Diagnosis present

## 2020-12-27 DIAGNOSIS — Z79899 Other long term (current) drug therapy: Secondary | ICD-10-CM

## 2020-12-27 DIAGNOSIS — H9190 Unspecified hearing loss, unspecified ear: Secondary | ICD-10-CM | POA: Diagnosis not present

## 2020-12-27 DIAGNOSIS — Z9889 Other specified postprocedural states: Secondary | ICD-10-CM | POA: Diagnosis not present

## 2020-12-27 DIAGNOSIS — K8309 Other cholangitis: Secondary | ICD-10-CM | POA: Diagnosis not present

## 2020-12-27 DIAGNOSIS — Z95828 Presence of other vascular implants and grafts: Secondary | ICD-10-CM

## 2020-12-27 HISTORY — PX: ERCP: SHX5425

## 2020-12-27 HISTORY — PX: BILIARY STENT PLACEMENT: SHX5538

## 2020-12-27 LAB — COMPREHENSIVE METABOLIC PANEL
ALT: 183 U/L — ABNORMAL HIGH (ref 0–44)
AST: 195 U/L — ABNORMAL HIGH (ref 15–41)
Albumin: 3.2 g/dL — ABNORMAL LOW (ref 3.5–5.0)
Alkaline Phosphatase: 342 U/L — ABNORMAL HIGH (ref 38–126)
Anion gap: 9 (ref 5–15)
BUN: 23 mg/dL (ref 8–23)
CO2: 22 mmol/L (ref 22–32)
Calcium: 8.2 mg/dL — ABNORMAL LOW (ref 8.9–10.3)
Chloride: 106 mmol/L (ref 98–111)
Creatinine, Ser: 1.11 mg/dL (ref 0.61–1.24)
GFR, Estimated: >60 mL/min (ref >60)
Glucose, Bld: 189 mg/dL — ABNORMAL HIGH (ref 70–99)
Potassium: 3.9 mmol/L (ref 3.5–5.1)
Sodium: 137 mmol/L (ref 135–145)
Total Bilirubin: 4.5 mg/dL — ABNORMAL HIGH (ref 0.3–1.2)
Total Protein: 6.8 g/dL (ref 6.5–8.1)

## 2020-12-27 LAB — GLUCOSE, CAPILLARY
Glucose-Capillary: 164 mg/dL — ABNORMAL HIGH (ref 70–99)
Glucose-Capillary: 133 mg/dL — ABNORMAL HIGH (ref 70–99)
Glucose-Capillary: 114 mg/dL — ABNORMAL HIGH (ref 70–99)
Glucose-Capillary: 137 mg/dL — ABNORMAL HIGH (ref 70–99)

## 2020-12-27 LAB — CBC WITH DIFFERENTIAL/PLATELET
Abs Immature Granulocytes: 0.11 10*3/uL — ABNORMAL HIGH (ref 0.00–0.07)
Basophils Absolute: 0.1 10*3/uL (ref 0.0–0.1)
Basophils Relative: 0 %
Eosinophils Absolute: 0.2 10*3/uL (ref 0.0–0.5)
Eosinophils Relative: 1 %
HCT: 42.6 % (ref 39.0–52.0)
Hemoglobin: 13.9 g/dL (ref 13.0–17.0)
Immature Granulocytes: 1 %
Lymphocytes Relative: 1 %
Lymphs Abs: 0.1 10*3/uL — ABNORMAL LOW (ref 0.7–4.0)
MCH: 32.2 pg (ref 26.0–34.0)
MCHC: 32.6 g/dL (ref 30.0–36.0)
MCV: 98.6 fL (ref 80.0–100.0)
Monocytes Absolute: 0.4 10*3/uL (ref 0.1–1.0)
Monocytes Relative: 3 %
Neutro Abs: 12.7 10*3/uL — ABNORMAL HIGH (ref 1.7–7.7)
Neutrophils Relative %: 94 %
Platelets: 248 10*3/uL (ref 150–400)
RBC: 4.32 MIL/uL (ref 4.22–5.81)
RDW: 13.7 % (ref 11.5–15.5)
WBC: 13.5 10*3/uL — ABNORMAL HIGH (ref 4.0–10.5)
nRBC: 0.1 % (ref 0.0–0.2)

## 2020-12-27 LAB — RESP PANEL BY RT-PCR (FLU A&B, COVID) ARPGX2
Influenza A by PCR: NEGATIVE
Influenza B by PCR: NEGATIVE
SARS Coronavirus 2 by RT PCR: NEGATIVE

## 2020-12-27 LAB — LACTIC ACID, PLASMA: Lactic Acid, Venous: 2.8 mmol/L (ref 0.5–1.9)

## 2020-12-27 LAB — URINALYSIS, ROUTINE W REFLEX MICROSCOPIC
Bacteria, UA: NONE SEEN
Glucose, UA: 50 mg/dL — AB
Ketones, ur: 5 mg/dL — AB
Leukocytes,Ua: NEGATIVE
Nitrite: NEGATIVE
Protein, ur: 100 mg/dL — AB
Specific Gravity, Urine: 1.036 — ABNORMAL HIGH (ref 1.005–1.030)
pH: 5 (ref 5.0–8.0)

## 2020-12-27 LAB — APTT: aPTT: 30 seconds (ref 24–36)

## 2020-12-27 LAB — PROTIME-INR
INR: 1.1 (ref 0.8–1.2)
Prothrombin Time: 14.5 seconds (ref 11.4–15.2)

## 2020-12-27 LAB — CREATININE, SERUM
Creatinine, Ser: 1.29 mg/dL — ABNORMAL HIGH (ref 0.61–1.24)
GFR, Estimated: >60 mL/min (ref >60)

## 2020-12-27 LAB — LIPASE, BLOOD: Lipase: 17 U/L (ref 11–51)

## 2020-12-27 IMAGING — DX DG CHEST 1V PORT
1 series · 1 of 1 positions shown · non-contrast
Comparison: [DATE].

CLINICAL DATA: Fever.

EXAM:
PORTABLE CHEST 1 VIEW

[chest ap]
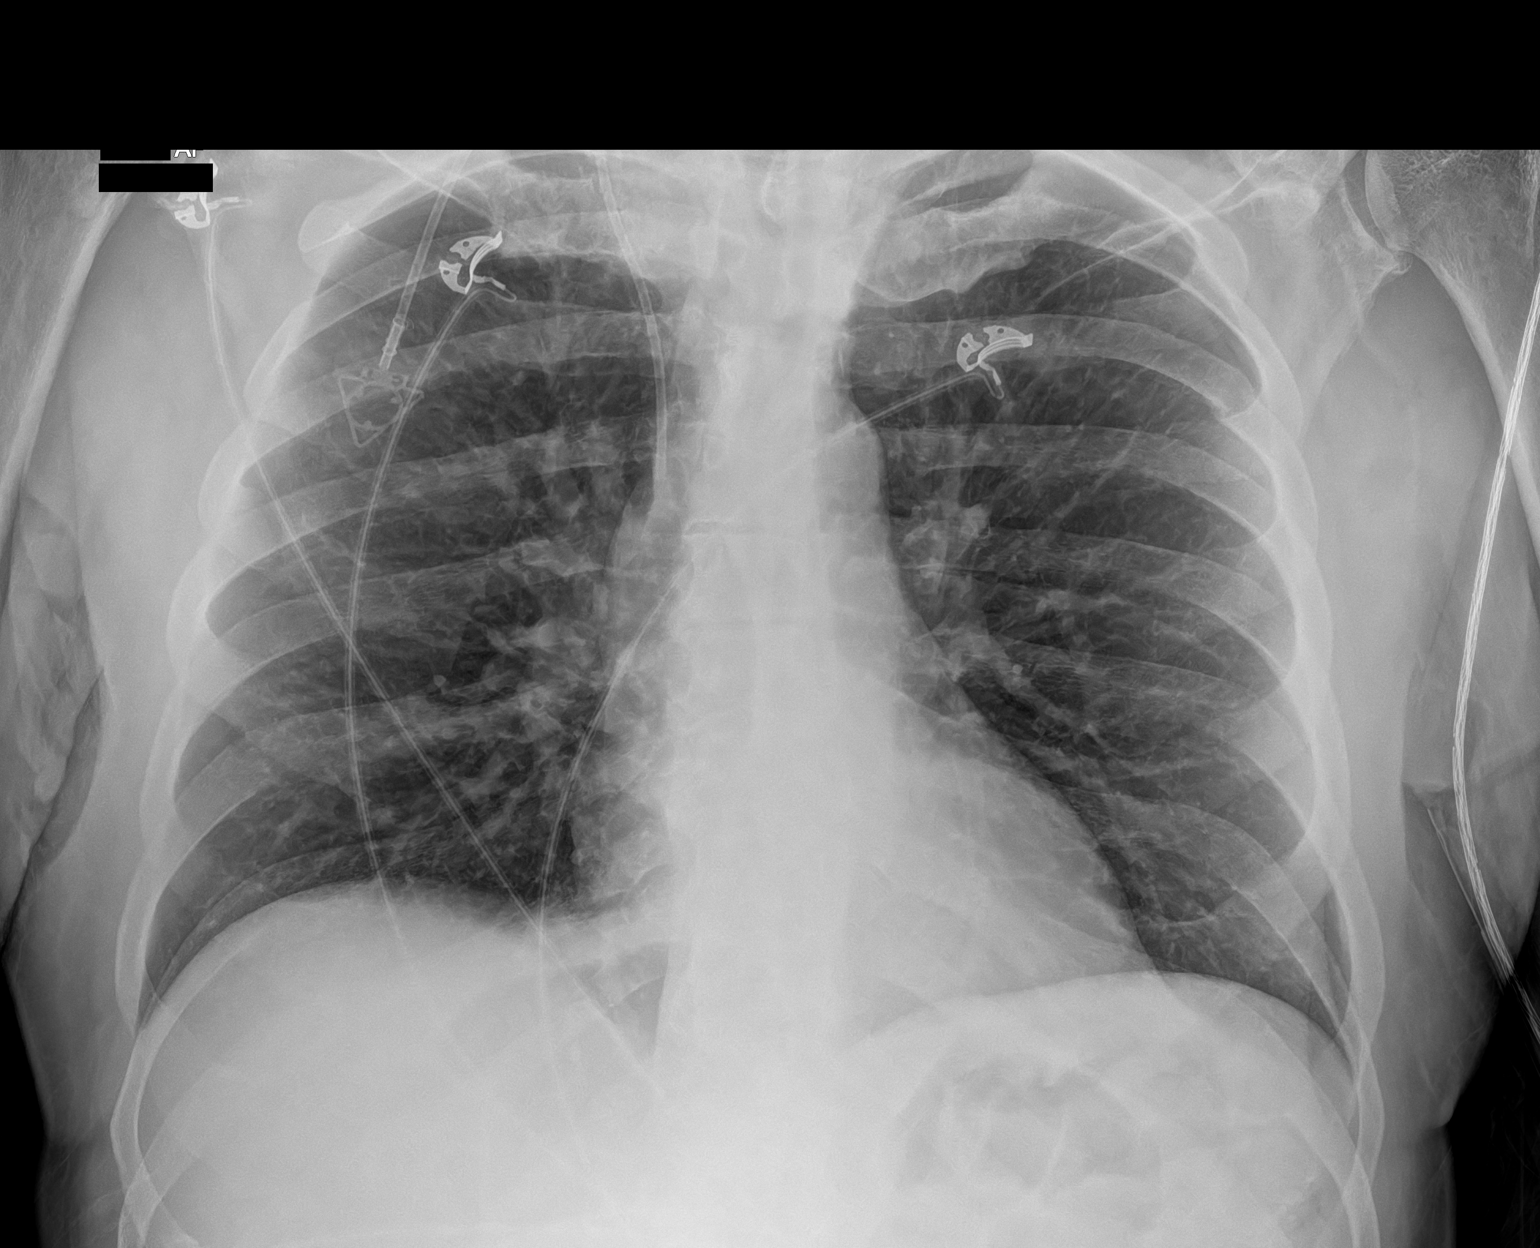

[1 of 1 positions shown; findings below may reference images not displayed]

FINDINGS: The heart size and mediastinal contours are within normal limits.
Both lungs are clear. Right internal jugular Port-A-Cath is noted
with distal tip in expected position of the SVC. The visualized
skeletal structures are unremarkable.
IMPRESSION: No active disease.

## 2020-12-27 IMAGING — DX DG ABDOMEN 1V
1 series · 1 of 1 positions shown · non-contrast
Comparison: CT [DATE], PET-CT [DATE] and images from ERCP
done earlier today.

CLINICAL DATA: Pancreatic cancer post stent replacement. The
original stent could not be located during ERCP.

EXAM:
ABDOMEN - 1 VIEW

[abdomen supine]
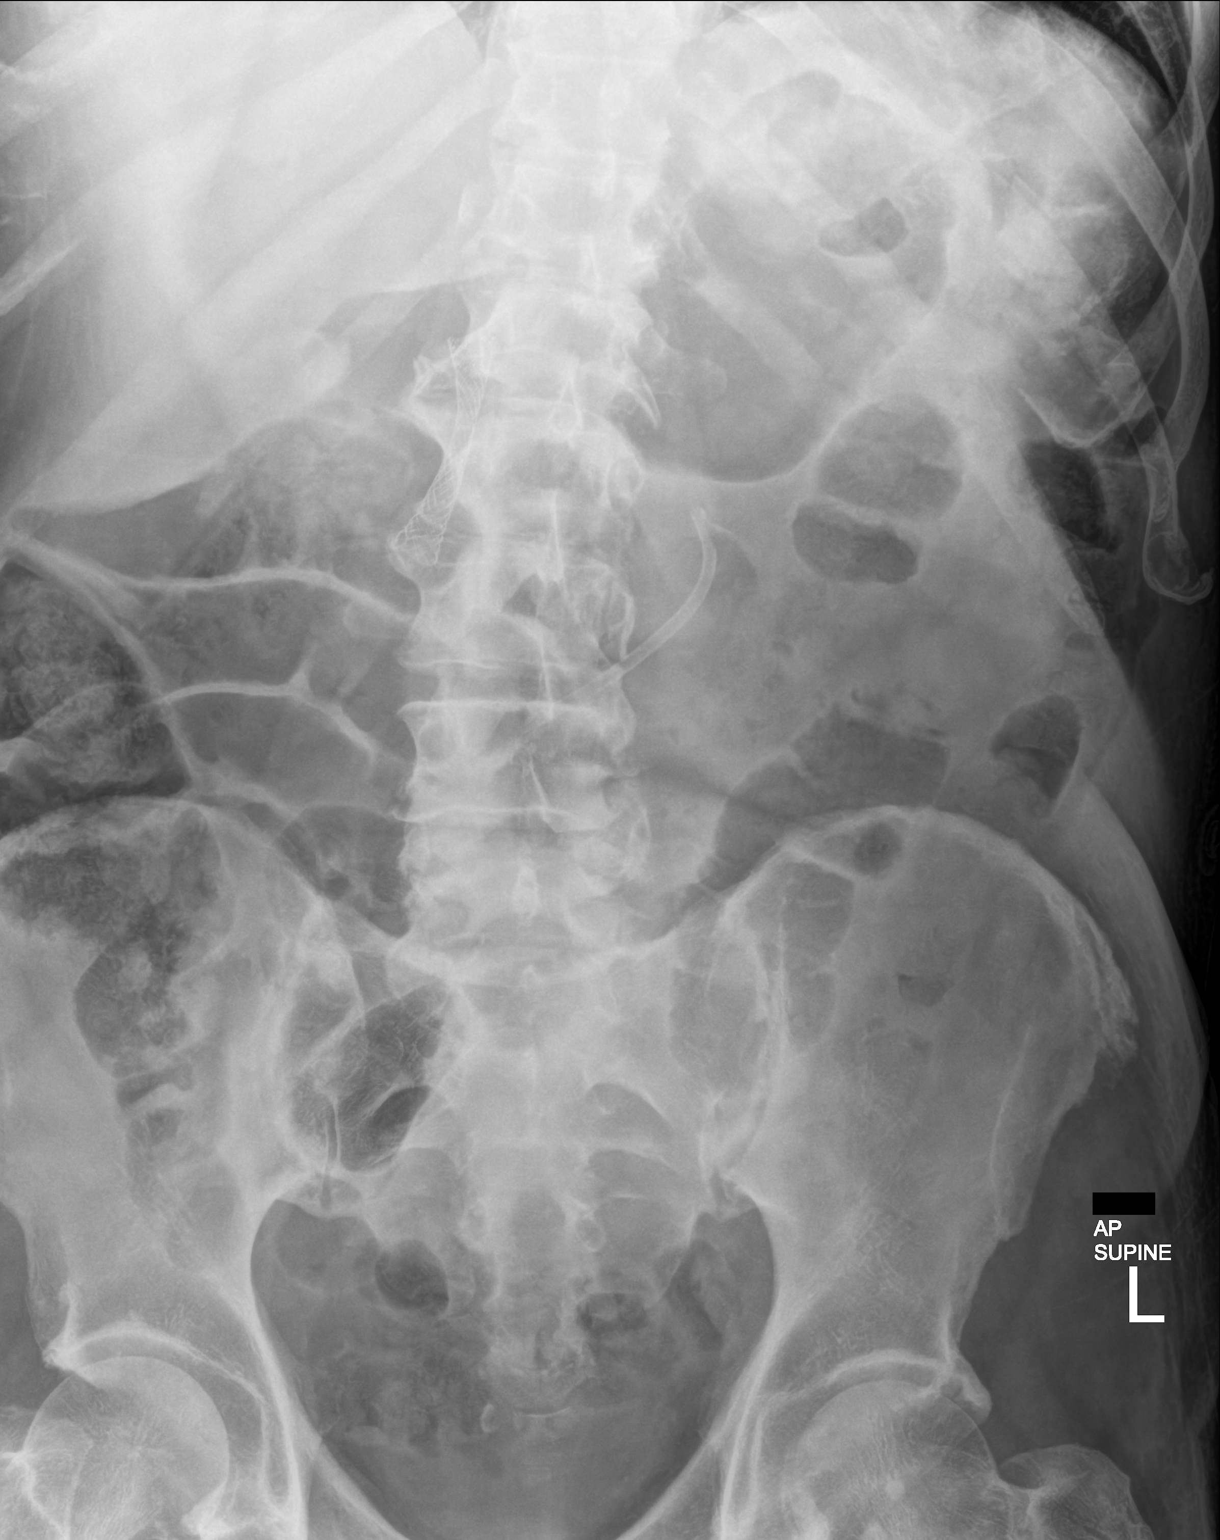

[1 of 1 positions shown; findings below may reference images not displayed]

FINDINGS: New metallic biliary stent in place with waisting in its mid
section. The preexistent plastic biliary stent overlaps the left
paraspinal region, displaced from its location on recent CT and PET,
presumably intraluminal within the proximal small bowel. No supine
evidence of free intraperitoneal air or bowel obstruction. Mild
degenerative changes are present within the lumbar spine.
IMPRESSION: 1. The plastic biliary stent is displaced from its recent location,
overlapping the left paraspinal region, presumably within the
proximal small bowel.
2. New biliary stent appears satisfactorily positioned.

## 2020-12-27 IMAGING — XA DG ERCP WO/W SPHINCTEROTOMY
1 series · 14 of 24 positions shown · non-contrast
Comparison: Abdominal CT [DATE] and PET-CT [DATE].

CLINICAL DATA: ERCP with stent replacement.  Pancreatic cancer.

EXAM:
ERCP
TECHNIQUE: Multiple spot images obtained with the fluoroscopic device and
submitted for interpretation post-procedure.
FLUOROSCOPY TIME:  Fluoroscopy Time:  2 minutes and 16 seconds
Radiation Exposure Index (if provided by the fluoroscopic device):
46.4 mGy
Number of Acquired Spot Images: 0

[Series 1: dg or · 0.20mm/px · 10 acquisitions, 14 frames shown]
[im 1/10]
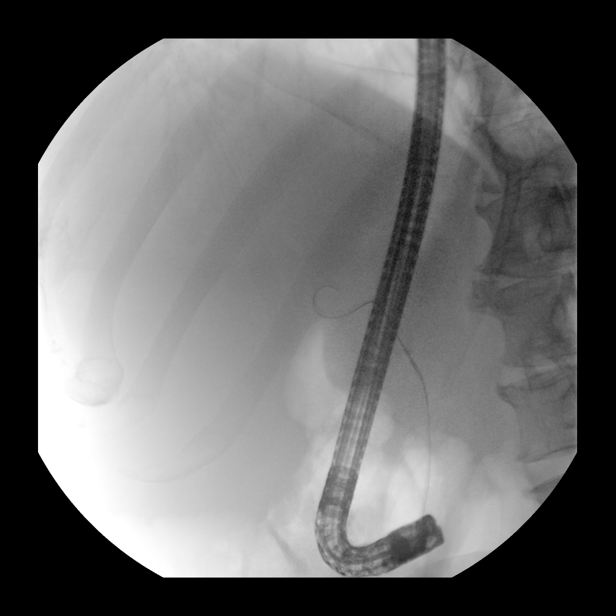
[im 1/10]
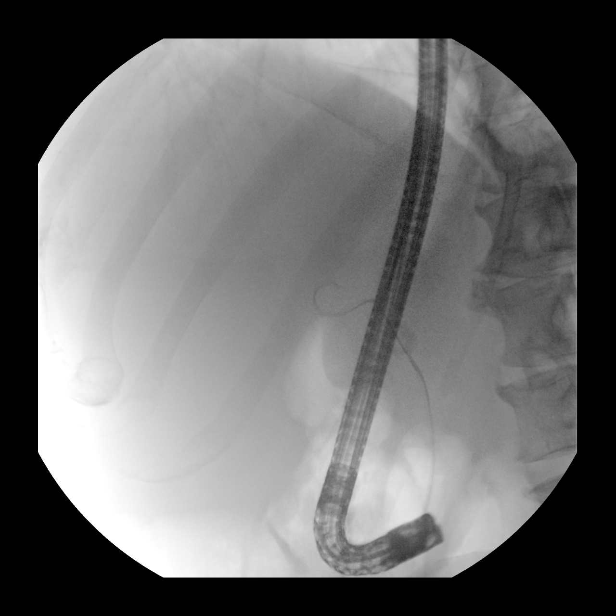
[im 2/10]
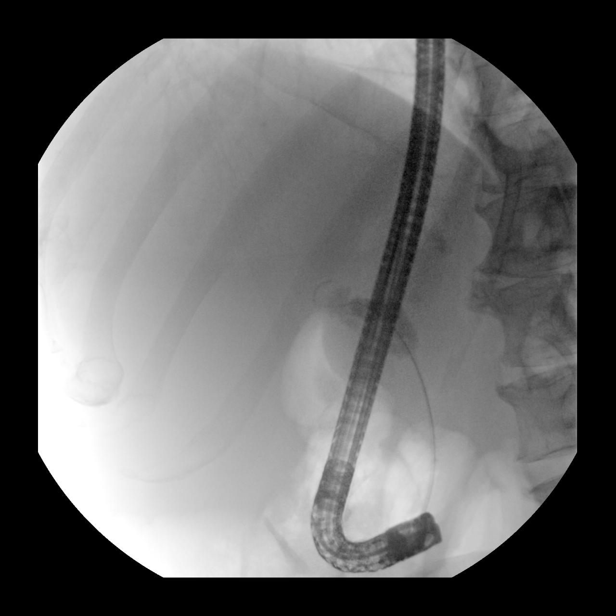
[im 3/10]
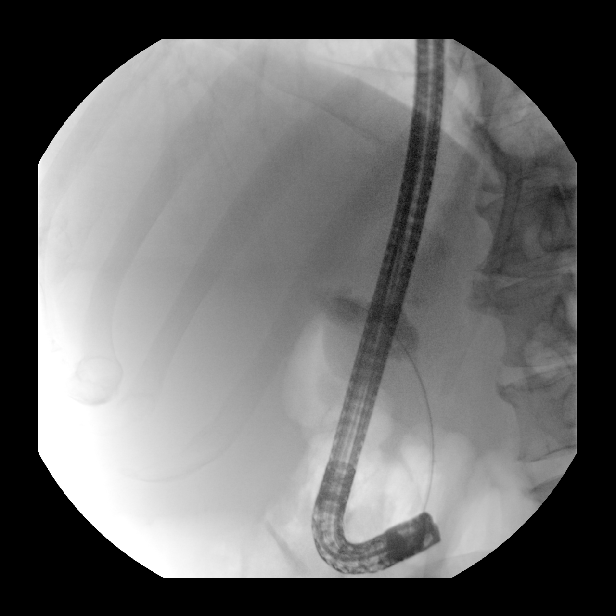
[im 3/10]
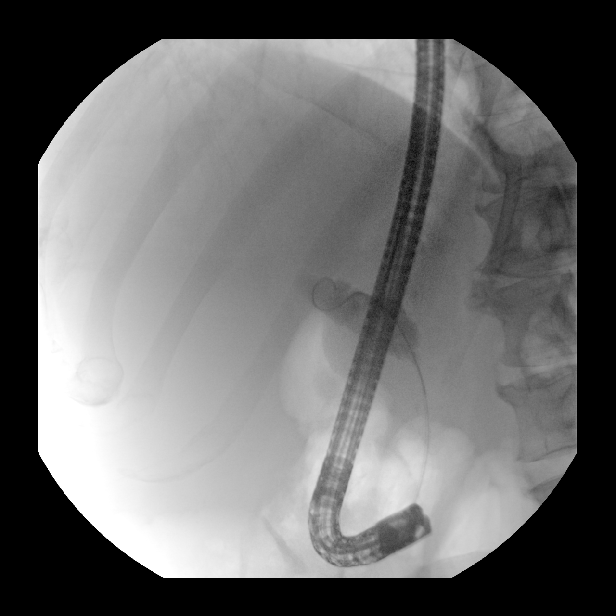
[im 4/10]
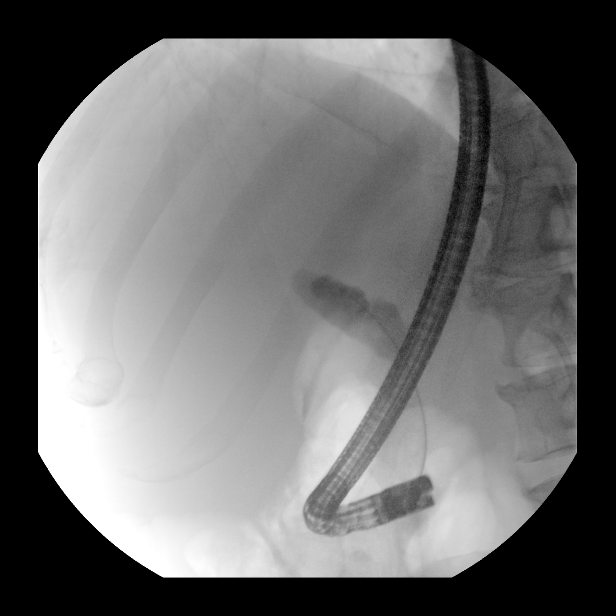
[im 5/10]
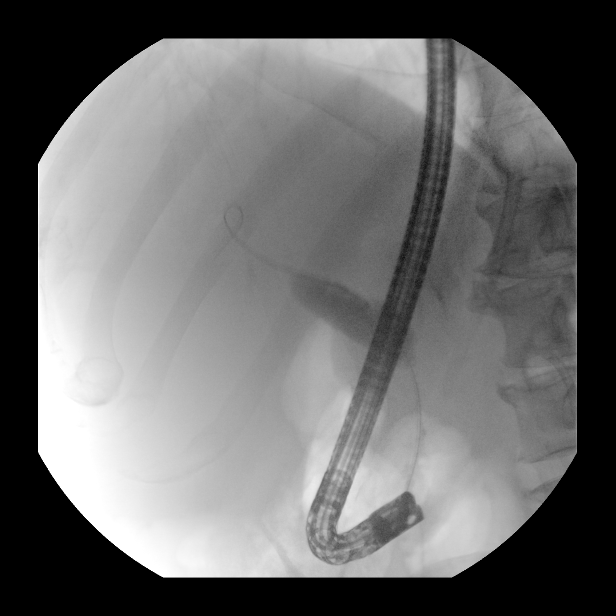
[im 5/10]
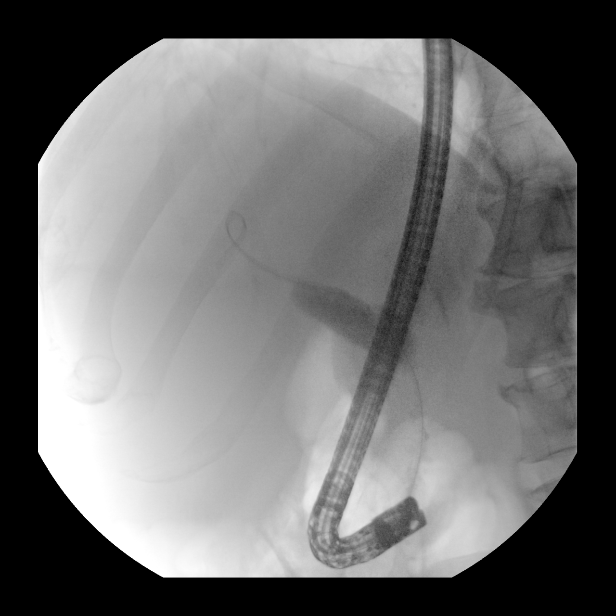
[im 6/10]
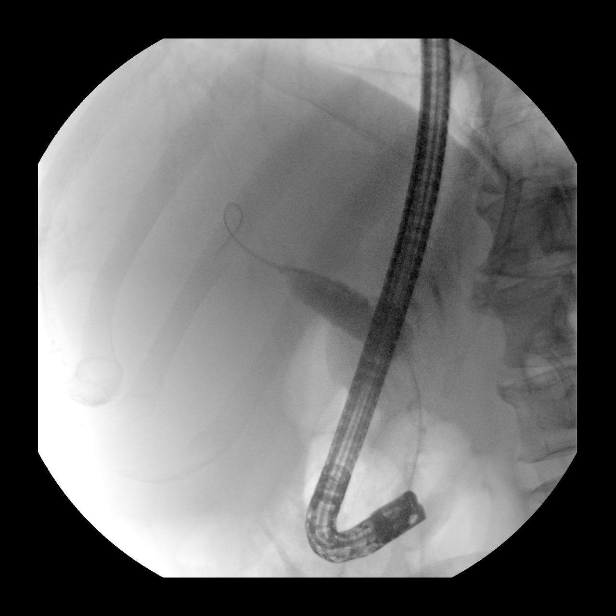
[im 7/10]
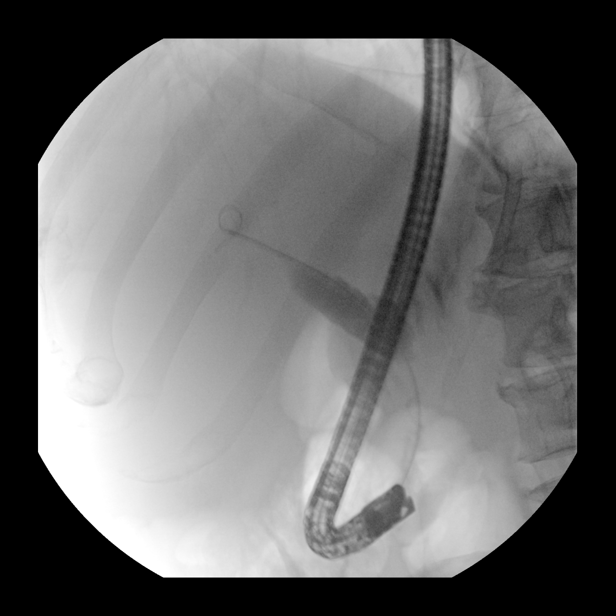
[im 7/10]
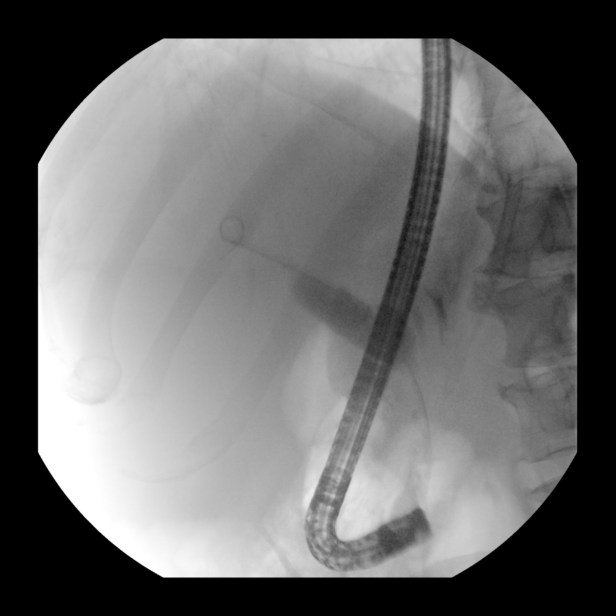
[im 8/10]
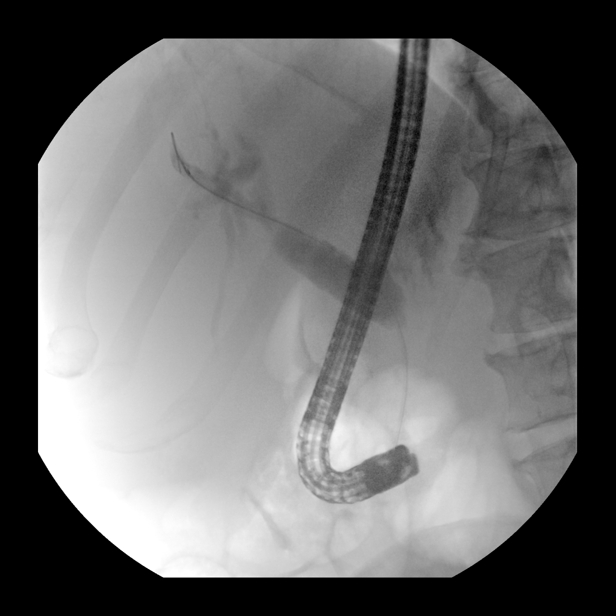
[im 9/10]
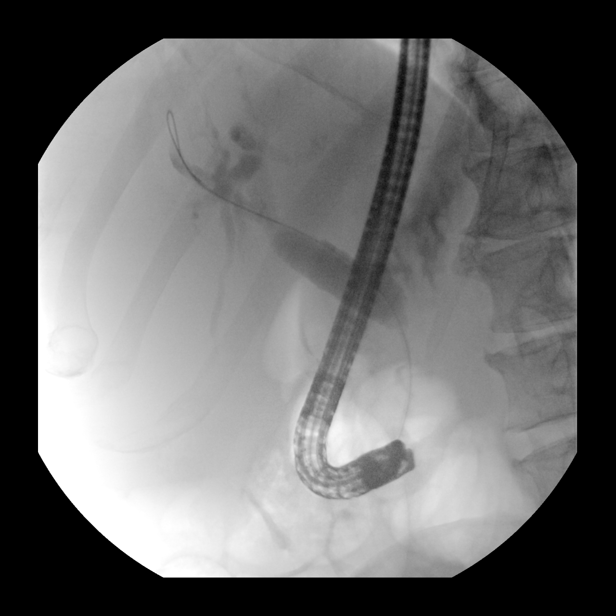
[im 10/10]
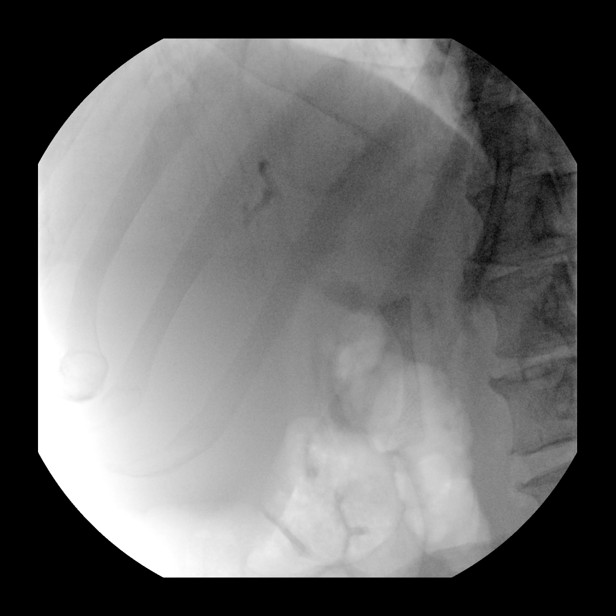

[14 of 24 positions shown; findings below may reference images not displayed]

FINDINGS: The submitted images demonstrate moderate dilatation of the common
hepatic duct and mild intrahepatic biliary dilatation. There is mild
wall irregularity of the common hepatic duct, but no discrete stones
are seen. There is high-grade focal narrowing of the common bile
duct at the level of the pancreatic head. Final images demonstrate
the placement of a metallic biliary stent across the common bile
duct with waisting within the pancreatic head. There is
decompression of the biliary system on the final image.

The plastic biliary stent is visible on some of the images
overlapping the spine, better seen on the postoperative abdominal
radiograph.
IMPRESSION: Images during biliary stent exchange for pancreatic cancer as
described. The known displaced plastic biliary stent is partially
visualized, better seen on postoperative radiographs.

These images were submitted for radiologic interpretation only.
Please see the procedural report for the amount of contrast and the
fluoroscopy time utilized.

## 2020-12-27 SURGERY — ERCP, WITH INTERVENTION IF INDICATED
Anesthesia: General

## 2020-12-27 MED ORDER — LACTATED RINGERS IV SOLN: INTRAVENOUS | Status: DC

## 2020-12-27 MED ORDER — ACETAMINOPHEN 325 MG PO TABS
650.0000 mg | ORAL_TABLET | Freq: Four times a day (QID) | ORAL | Status: DC | PRN
  Administered 2020-12-27: 650 mg via ORAL
  Filled 2020-12-27: qty 2

## 2020-12-27 MED ORDER — ONDANSETRON HCL 4 MG/2ML IJ SOLN: 4.0000 mg | Freq: Once | INTRAMUSCULAR | Status: DC | PRN

## 2020-12-27 MED ORDER — ACETAMINOPHEN 500 MG PO TABS
1000.0000 mg | ORAL_TABLET | Freq: Once | ORAL | Status: AC
  Administered 2020-12-27: 1000 mg via ORAL
  Filled 2020-12-27: qty 2

## 2020-12-27 MED ORDER — SUCCINYLCHOLINE CHLORIDE 200 MG/10ML IV SOSY
PREFILLED_SYRINGE | INTRAVENOUS | Status: DC | PRN
  Administered 2020-12-27: 160 mg via INTRAVENOUS

## 2020-12-27 MED ORDER — FENTANYL CITRATE (PF) 100 MCG/2ML IJ SOLN
INTRAMUSCULAR | Status: DC | PRN
  Administered 2020-12-27 (×2): 50 ug via INTRAVENOUS

## 2020-12-27 MED ORDER — LIDOCAINE HCL (PF) 2 % IJ SOLN
INTRAMUSCULAR | Status: AC
  Filled 2020-12-27: qty 5

## 2020-12-27 MED ORDER — SODIUM CHLORIDE 0.9 % IV SOLN
INTRAVENOUS | Status: DC | PRN
  Administered 2020-12-27: 12 mL

## 2020-12-27 MED ORDER — LACTATED RINGERS IV BOLUS (SEPSIS)
1000.0000 mL | Freq: Once | INTRAVENOUS | Status: AC
  Administered 2020-12-27: 1000 mL via INTRAVENOUS

## 2020-12-27 MED ORDER — KETOROLAC TROMETHAMINE 30 MG/ML IJ SOLN: 30.0000 mg | Freq: Once | INTRAMUSCULAR | Status: DC | PRN

## 2020-12-27 MED ORDER — PIPERACILLIN-TAZOBACTAM 4.5 G IVPB
4.5000 g | Freq: Once | INTRAVENOUS | Status: AC
  Administered 2020-12-27: 4.5 g via INTRAVENOUS
  Filled 2020-12-27: qty 100

## 2020-12-27 MED ORDER — SODIUM CHLORIDE 0.9 % IV SOLN
INTRAVENOUS | Status: AC
  Filled 2020-12-27: qty 100

## 2020-12-27 MED ORDER — ONDANSETRON HCL 4 MG/2ML IJ SOLN
INTRAMUSCULAR | Status: DC | PRN
  Administered 2020-12-27: 4 mg via INTRAVENOUS

## 2020-12-27 MED ORDER — ACETAMINOPHEN 650 MG RE SUPP: 650.0000 mg | Freq: Four times a day (QID) | RECTAL | Status: DC | PRN

## 2020-12-27 MED ORDER — DEXAMETHASONE SODIUM PHOSPHATE 10 MG/ML IJ SOLN
INTRAMUSCULAR | Status: DC | PRN
  Administered 2020-12-27: 10 mg via INTRAVENOUS

## 2020-12-27 MED ORDER — ENOXAPARIN SODIUM 40 MG/0.4ML IJ SOSY
40.0000 mg | PREFILLED_SYRINGE | INTRAMUSCULAR | Status: DC
  Administered 2020-12-28 – 2020-12-30 (×3): 40 mg via SUBCUTANEOUS
  Filled 2020-12-27 (×2): qty 0.4

## 2020-12-27 MED ORDER — PIPERACILLIN-TAZOBACTAM 3.375 G IVPB
3.3750 g | Freq: Three times a day (TID) | INTRAVENOUS | Status: DC
Start: 2020-12-27 — End: 2020-12-30
  Administered 2020-12-27 – 2020-12-30 (×8): 3.375 g via INTRAVENOUS
  Filled 2020-12-27 (×8): qty 50

## 2020-12-27 MED ORDER — ONDANSETRON HCL 4 MG/2ML IJ SOLN: 4.0000 mg | Freq: Four times a day (QID) | INTRAMUSCULAR | Status: DC | PRN

## 2020-12-27 MED ORDER — INSULIN ASPART 100 UNIT/ML IJ SOLN
0.0000 [IU] | Freq: Every day | INTRAMUSCULAR | Status: DC
  Administered 2020-12-28 – 2020-12-29 (×2): 3 [IU] via SUBCUTANEOUS

## 2020-12-27 MED ORDER — ONDANSETRON HCL 4 MG PO TABS: 4.0000 mg | ORAL_TABLET | Freq: Four times a day (QID) | ORAL | Status: DC | PRN

## 2020-12-27 MED ORDER — ONDANSETRON HCL 4 MG/2ML IJ SOLN
INTRAMUSCULAR | Status: AC
  Filled 2020-12-27: qty 2

## 2020-12-27 MED ORDER — OXYCODONE-ACETAMINOPHEN 5-325 MG PO TABS
1.0000 | ORAL_TABLET | Freq: Four times a day (QID) | ORAL | Status: DC | PRN
  Administered 2020-12-29: 1 via ORAL
  Filled 2020-12-27: qty 1

## 2020-12-27 MED ORDER — FENTANYL CITRATE (PF) 100 MCG/2ML IJ SOLN
INTRAMUSCULAR | Status: AC
  Filled 2020-12-27: qty 2

## 2020-12-27 MED ORDER — CHLORHEXIDINE GLUCONATE CLOTH 2 % EX PADS: 6.0000 | MEDICATED_PAD | Freq: Once | CUTANEOUS | Status: DC

## 2020-12-27 MED ORDER — LIDOCAINE HCL (CARDIAC) PF 50 MG/5ML IV SOSY
PREFILLED_SYRINGE | INTRAVENOUS | Status: DC | PRN
  Administered 2020-12-27: 100 mg via INTRAVENOUS

## 2020-12-27 MED ORDER — STERILE WATER FOR IRRIGATION IR SOLN
Status: DC | PRN
  Administered 2020-12-27: 1000 mL

## 2020-12-27 MED ORDER — MIDAZOLAM HCL 2 MG/2ML IJ SOLN
INTRAMUSCULAR | Status: DC | PRN
  Administered 2020-12-27: 2 mg via INTRAVENOUS

## 2020-12-27 MED ORDER — SUGAMMADEX SODIUM 200 MG/2ML IV SOLN
INTRAVENOUS | Status: DC | PRN
  Administered 2020-12-27: 200 mg via INTRAVENOUS

## 2020-12-27 MED ORDER — HYDROCODONE-ACETAMINOPHEN 7.5-325 MG PO TABS
1.0000 | ORAL_TABLET | Freq: Once | ORAL | Status: DC | PRN
Start: 2020-12-27 — End: 2020-12-27

## 2020-12-27 MED ORDER — CEFAZOLIN SODIUM-DEXTROSE 2-4 GM/100ML-% IV SOLN: 2.0000 g | INTRAVENOUS | Status: DC

## 2020-12-27 MED ORDER — GLUCAGON HCL RDNA (DIAGNOSTIC) 1 MG IJ SOLR
INTRAMUSCULAR | Status: AC
  Filled 2020-12-27: qty 2

## 2020-12-27 MED ORDER — LEVOTHYROXINE SODIUM 75 MCG PO TABS
175.0000 ug | ORAL_TABLET | Freq: Every day | ORAL | Status: DC
  Administered 2020-12-28 – 2020-12-30 (×3): 175 ug via ORAL
  Filled 2020-12-27 (×3): qty 1

## 2020-12-27 MED ORDER — HYDROMORPHONE HCL 1 MG/ML IJ SOLN: 0.5000 mg | INTRAMUSCULAR | Status: DC | PRN

## 2020-12-27 MED ORDER — INSULIN ASPART 100 UNIT/ML IJ SOLN
0.0000 [IU] | Freq: Three times a day (TID) | INTRAMUSCULAR | Status: DC
  Administered 2020-12-28: 8 [IU] via SUBCUTANEOUS
  Administered 2020-12-28 – 2020-12-29 (×3): 5 [IU] via SUBCUTANEOUS
  Administered 2020-12-29: 3 [IU] via SUBCUTANEOUS
  Administered 2020-12-29: 5 [IU] via SUBCUTANEOUS
  Administered 2020-12-30: 3 [IU] via SUBCUTANEOUS
  Administered 2020-12-30: 8 [IU] via SUBCUTANEOUS

## 2020-12-27 MED ORDER — PROPOFOL 10 MG/ML IV BOLUS
INTRAVENOUS | Status: AC
  Filled 2020-12-27: qty 20

## 2020-12-27 MED ORDER — KETAMINE HCL 10 MG/ML IJ SOLN
INTRAMUSCULAR | Status: DC | PRN
  Administered 2020-12-27: 50 mg via INTRAVENOUS

## 2020-12-27 MED ORDER — MEPERIDINE HCL 50 MG/ML IJ SOLN: 6.2500 mg | INTRAMUSCULAR | Status: DC | PRN

## 2020-12-27 MED ORDER — INSULIN DETEMIR 100 UNIT/ML ~~LOC~~ SOLN
15.0000 [IU] | Freq: Every day | SUBCUTANEOUS | Status: DC
  Administered 2020-12-27 – 2020-12-29 (×3): 15 [IU] via SUBCUTANEOUS
  Filled 2020-12-27 (×5): qty 0.15

## 2020-12-27 MED ORDER — HYDROMORPHONE HCL 1 MG/ML IJ SOLN: 0.2500 mg | INTRAMUSCULAR | Status: DC | PRN

## 2020-12-27 MED ORDER — ROCURONIUM BROMIDE 100 MG/10ML IV SOLN
INTRAVENOUS | Status: DC | PRN
  Administered 2020-12-27: 40 mg via INTRAVENOUS

## 2020-12-27 SURGICAL SUPPLY — 22 items
BALLN RETRIEVAL 12X15 (BALLOONS) IMPLANT
BASKET TRAPEZOID 3X6 (MISCELLANEOUS) IMPLANT
BASKET TRAPEZOID LITHO 2.0X5 (MISCELLANEOUS) IMPLANT
DEVICE INFLATION ENCORE 26 (MISCELLANEOUS) IMPLANT
DEVICE LOCKING W-BIOPSY CAP (MISCELLANEOUS) ×2 IMPLANT
GUIDEWIRE HYDRA JAGWIRE .35 (WIRE) IMPLANT
GUIDEWIRE JAG HINI 025X260CM (WIRE) IMPLANT
KIT ENDO PROCEDURE PEN (KITS) ×2 IMPLANT
KIT TURNOVER KIT A (KITS) ×2 IMPLANT
LUBRICANT JELLY 4.5OZ STERILE (MISCELLANEOUS) IMPLANT
PAD ARMBOARD 7.5X6 YLW CONV (MISCELLANEOUS) ×2 IMPLANT
POSITIONER HEAD 8X9X4 ADT (SOFTGOODS) IMPLANT
SCOPE SPY DS DISPOSABLE (MISCELLANEOUS) IMPLANT
SNARE ROTATE MED OVAL 20MM (MISCELLANEOUS) IMPLANT
SNARE SHORT THROW 13M SML OVAL (MISCELLANEOUS) IMPLANT
SPHINCTEROTOME AUTOTOME .25 (MISCELLANEOUS) IMPLANT
SPHINCTEROTOME HYDRATOME 44 (MISCELLANEOUS) IMPLANT
SYSTEM CONTINUOUS INJECTION (MISCELLANEOUS) IMPLANT
TUBING INSUFFLATOR CO2MPACT (TUBING) ×2 IMPLANT
WALLSTENT METAL COVERED 10X60 (STENTS) IMPLANT
WALLSTENT METAL COVERED 10X80 (STENTS) IMPLANT
WATER STERILE IRR 1000ML POUR (IV SOLUTION) ×2 IMPLANT

## 2020-12-27 NOTE — Progress Notes (Signed)
ERCP note.  Ampulla of Vater with evidence of prior sphincterotomy. There was no stent.  Fluoroscopy did not reveal stent to be in the bile duct. Cholangiogram revealed high-grade CBD stricture with biliary dilation upstream.  Cystic duct patent. Fully covered 40 x 10 mm Wallstent placed across stricture. As the stent was deployed there was gush of mucopurulent material contrast and bile. Patient tolerated the procedure well.

## 2020-12-27 NOTE — ED Triage Notes (Signed)
Pt presents to ED, states he is suppose to have a stent removed and replaced from his gallbladder, pt states he thinks he is dehydrated and needs fluid. Dr [name] sent to ED.

## 2020-12-27 NOTE — ED Provider Notes (Signed)
[facility] EMERGENCY DEPARTMENT Provider Note   CSN: [id number] Arrival date & time: 12/27/20  1028     History Chief Complaint  Patient presents with   Fever    Alexander Banks is a 65 y.o. male.  Patient is followed by Dr. Dereck Leep.  Patient has a biliary tree stent in place.  Was supposed to have it removed and replaced today.  But yesterday patient started with fever and chills and epigastric abdominal pain.  Dr. Dereck Leep called and stated that his LFTs were abnormal yesterday 2.  Concerns about cholangitis.  Dr. Melony Overly still planning to perhaps remove the stent today.  Patient meeting sepsis criteria here today we will start sepsis criteria.  But will treat just with high-dose Zosyn assuming cholangitis.  Patient is not hypotensive.      Past Medical History:  Diagnosis Date   Anemia, iron deficiency    Diabetes mellitus without complication ([place])    on meds   Hearing loss    Bil hearing aids   Hyperlipidemia    Hypertension    Port-A-Cath in place 12/26/2020   Prediabetes    Sleep apnea     Patient Active Problem List   Diagnosis Date Noted   Port-A-Cath in place 12/26/2020   Bile duct obstruction 12/21/2020   Pancreatic adenocarcinoma ([place]) 12/04/2020   Erectile dysfunction 11/17/2020   Hyperlipidemia associated with type 2 diabetes mellitus ([place]) 11/17/2020   Poorly controlled type 2 diabetes mellitus ([place]) 11/13/2020   Pancreatic tumor    Chronic cholecystitis    Elevated LFTs    DKA (diabetic ketoacidosis) ([place]) 11/07/2020   Hyperglycemia due to diabetes mellitus ([place]) 11/07/2020   Rectal abscess 11/07/2020   Hyponatremia 11/07/2020   Macrocytic anemia 11/07/2020   AKI (acute kidney injury) ([place]) 11/07/2020   Dehydration 11/07/2020   Cellulitis 11/07/2020   Hypothyroidism 09/27/2014   Prediabetes 12/07/2012   Obstructive sleep apnea 12/07/2012   Obesity 12/07/2012   Osteoarthritis 12/07/2012   Hyperlipidemia 10/19/2012   Essential hypertension,  benign 10/19/2012    Past Surgical History:  Procedure Laterality Date   BILIARY STENT PLACEMENT N/A 11/09/2020   Procedure: BILIARY STENT PLACEMENT - 44 F X 5 CM STENT;  Surgeon: Rogene Houston, MD;  Location: [facility];  Service: Endoscopy;  Laterality: N/A;   COLONOSCOPY     ERCP N/A 11/09/2020   Procedure: ENDOSCOPIC RETROGRADE CHOLANGIOPANCREATOGRAPHY (ERCP);  Surgeon: Rogene Houston, MD;  Location: [facility];  Service: Endoscopy;  Laterality: N/A;   ESOPHAGOGASTRODUODENOSCOPY (EGD) WITH PROPOFOL N/A 11/30/2020   Procedure: ESOPHAGOGASTRODUODENOSCOPY (EGD) WITH PROPOFOL;  Surgeon: Milus Banister, MD;  Location: [facility];  Service: Endoscopy;  Laterality: N/A;   EUS N/A 11/30/2020   Procedure: UPPER ENDOSCOPIC ULTRASOUND (EUS) RADIAL;  Surgeon: Milus Banister, MD;  Location: [facility];  Service: Endoscopy;  Laterality: N/A;   FINE NEEDLE ASPIRATION N/A 11/30/2020   Procedure: FINE NEEDLE ASPIRATION (FNA) LINEAR;  Surgeon: Milus Banister, MD;  Location: [facility];  Service: Endoscopy;  Laterality: N/A;   GIVENS CAPSULE STUDY     IR IMAGING GUIDED PORT INSERTION  12/18/2020   KNEE SURGERY     4 surgeries on left, 2 on right knee   SHOULDER SURGERY     right shoulder   SPHINCTEROTOMY N/A 11/09/2020   Procedure: SHORT BILIARY SPHINCTEROTOMY;  Surgeon: Rogene Houston, MD;  Location: [facility];  Service: Endoscopy;  Laterality: N/A;       Family History  Problem Relation Age of  Onset   Heart attack Father    Heart attack Paternal Grandfather    Leukemia Sister    Heart attack Brother     Social History   Tobacco Use   Smoking status: Every Day    Types: Cigars   Smokeless tobacco: Never   Tobacco comments:    Smoke 3-4 cigars a day  Vaping Use   Vaping Use: Never used  Substance Use Topics   Alcohol use: Yes    Comment: occasional   Drug use: Never    Home Medications Prior to Admission medications   Medication Sig Start Date End Date Taking? Authorizing  Provider  Continuous Blood Gluc Receiver (FREESTYLE LIBRE 2 READER) DEVI As directed 11/13/20   Cassandria Anger, MD  Continuous Blood Gluc Sensor (FREESTYLE LIBRE 2 SENSOR) MISC 1 Piece by Does not apply route every 14 (fourteen) days. 11/22/20   Brita Romp, NP  dronabinol (MARINOL) 5 MG capsule Take 1 capsule (5 mg total) by mouth 2 (two) times daily before a meal. 12/08/20   Pennington, Rebekah M, PA-C  Glucosamine-Chondroitin (OSTEO BI-FLEX REGULAR STRENGTH PO) Take 2 tablets by mouth daily.    [provider]  ibuprofen (ADVIL) 200 MG tablet Take 400 mg by mouth every 6 (six) hours as needed (back pain).    [provider]  insulin detemir (LEVEMIR) 100 UNIT/ML FlexPen Inject 40 Units into the skin at bedtime. 11/22/20   Brita Romp, NP  insulin lispro (HUMALOG KWIKPEN) 100 UNIT/ML KwikPen Inject 14-20 Units into the skin 3 (three) times daily before meals. Patient taking differently: Inject 12-15 Units into the skin 3 (three) times daily before meals. 11/22/20   Brita Romp, NP  IRON-VITAMIN C PO Take 1 tablet by mouth daily.    [provider]  levothyroxine (SYNTHROID) 175 MCG tablet Take 1 tablet (175 mcg total) by mouth daily before breakfast. 11/10/20   Murlean Iba, MD  lidocaine-prilocaine (EMLA) cream Apply a small amount to port a cath site (do not rub in) and cover with plastic wrap 1 hour prior to infusion appointments 12/26/20   Derek Jack, MD  Melatonin-Pyridoxine (MELATIN PO) Take by mouth. Takes 2 qhs    [provider]  ondansetron (ZOFRAN) 8 MG tablet Take 1 tablet (8 mg total) by mouth 2 (two) times daily as needed. Start on day 3 after chemotherapy. 12/26/20   Derek Jack, MD  OneTouch Delica Lancets [id number] MISC TEST ONE TIME DAILY 11/30/19   [name], MD  oxyCODONE-acetaminophen (PERCOCET/ROXICET) 5-325 MG tablet Take 1 tablet by mouth every 6 (six) hours as needed for severe pain.    [provider]  prochlorperazine (COMPAZINE) 10 MG tablet Take 1 tablet (10 mg total) by mouth every 6 (six) hours as needed (Nausea or vomiting). 12/26/20   Derek Jack, MD  rosuvastatin (CRESTOR) 40 MG tablet TAKE 1 TABLET DAILY (STOP ATORVASTATIN) Patient taking differently: Take 40 mg by mouth daily. (STOP ATORVASTATIN) 07/25/20   [name], MD  sildenafil (VIAGRA) 100 MG tablet Take 0.5-1 tablets (50-100 mg total) by mouth daily as needed for erectile dysfunction. 11/17/20   [name], MD    Allergies    Lisinopril  Review of Systems   Review of Systems  Constitutional:  Positive for chills and fever.  HENT:  Negative for ear pain and sore throat.   Eyes:  Negative for pain and visual disturbance.  Respiratory:  Negative for cough and shortness of  breath.   Cardiovascular:  Negative for chest pain and palpitations.  Gastrointestinal:  Positive for abdominal pain and vomiting.  Genitourinary:  Negative for dysuria and hematuria.  Musculoskeletal:  Negative for arthralgias and back pain.  Skin:  Negative for color change and rash.  Neurological:  Negative for seizures and syncope.  All other systems reviewed and are negative.  Physical Exam Updated Vital Signs BP (!) 146/75 (BP Location: Right Arm)   Pulse (!) 119   Temp (!) 101.4 F (38.6 C) (Oral)   Resp 20   Ht 1.892 m (6' 2.5")   Wt 85.7 kg   SpO2 93%   BMI 23.94 kg/m   Physical Exam Vitals and nursing note reviewed.  Constitutional:      General: He is in acute distress.     Appearance: Normal appearance. He is well-developed. He is ill-appearing and toxic-appearing.  HENT:     Head: Normocephalic and atraumatic.  Eyes:     Extraocular Movements: Extraocular movements intact.     Conjunctiva/sclera: Conjunctivae normal.     Pupils: Pupils are equal, round, and reactive to light.  Cardiovascular:     Rate and Rhythm: Regular rhythm. Tachycardia present.     Heart sounds: No murmur  heard. Pulmonary:     Effort: No respiratory distress.     Breath sounds: Normal breath sounds. No wheezing, rhonchi or rales.     Comments: Tachypneic Abdominal:     Palpations: Abdomen is soft.     Tenderness: There is abdominal tenderness.  Musculoskeletal:        General: No swelling.     Cervical back: Normal range of motion and neck supple. No rigidity.  Skin:    General: Skin is warm and dry.     Capillary Refill: Capillary refill takes less than 2 seconds.  Neurological:     General: No focal deficit present.     Mental Status: He is alert and oriented to person, place, and time.    ED Results / Procedures / Treatments   Labs (all labs ordered are listed, but only abnormal results are displayed) Labs Reviewed  RESP PANEL BY RT-PCR (FLU A&B, COVID) ARPGX2  CULTURE, BLOOD (ROUTINE X 2)  CULTURE, BLOOD (ROUTINE X 2)  LACTIC ACID, PLASMA  LACTIC ACID, PLASMA  COMPREHENSIVE METABOLIC PANEL  CBC WITH DIFFERENTIAL/PLATELET  URINALYSIS, ROUTINE W REFLEX MICROSCOPIC  LIPASE, BLOOD    EKG None  Radiology No results found.  Procedures Procedures   Medications Ordered in ED Medications  piperacillin-tazobactam (ZOSYN) IVPB 4.5 g (has no administration in time range)    ED Course  I have reviewed the triage vital signs and the nursing notes.  Pertinent labs & imaging results that were available during my care of the patient were reviewed by me and considered in my medical decision making (see chart for details).    MDM Rules/Calculators/A&P                         CRITICAL CARE Performed by: Fredia Sorrow Total critical care time: 60 minutes Critical care time was exclusive of separately billable procedures and treating other patients. Critical care was necessary to treat or prevent imminent or life-threatening deterioration. Critical care was time spent personally by me on the following activities: development of treatment plan with patient and/or surrogate  as well as nursing, discussions with consultants, evaluation of patient's response to treatment, examination of patient, obtaining history from patient or surrogate, ordering  and performing treatments and interventions, ordering and review of laboratory studies, ordering and review of radiographic studies, pulse oximetry and re-evaluation of patient's condition.  Patient meets sepsis criteria.  So therefore will initiate sepsis protocol.  To include 30 cc/kg fluid challenge.  We will give patient some Tylenol.  But since this is most likely just cholangitis.  Although patient fairly sick with it he is not hypotensive.  We will start Zosyn for the antibiotic.  Patient's gastroenterologist still wants to go ahead and remove the stent today.  But wanted antibiotics and admission.  Discussed with hospitalist team who will see for admission.  Final Clinical Impression(s) / ED Diagnoses Final diagnoses:  Cholangitis    Rx / DC Orders ED Discharge Orders     None        Fredia Sorrow, MD 12/27/20 1227

## 2020-12-27 NOTE — Anesthesia Preprocedure Evaluation (Signed)
Anesthesia Evaluation  Patient identified by MRN, date of birth, ID band Patient awake    Reviewed: Allergy & Precautions, NPO status , Patient's Chart, lab work & pertinent test results  Airway Mallampati: III  TM Distance: >3 FB Neck ROM: Full   Comment: Large beard Dental  (+) Poor Dentition, Dental Advisory Given,    Pulmonary sleep apnea and Continuous Positive Airway Pressure Ventilation , Current Smoker and Patient abstained from smoking.,  3-4 cigars/d   Pulmonary exam normal breath sounds clear to auscultation       Cardiovascular hypertension, Normal cardiovascular exam Rhythm:Regular Rate:Normal     Neuro/Psych negative neurological ROS  negative psych ROS   GI/Hepatic negative GI ROS, (+)     substance abuse  alcohol use, Pancreatic cancer   Endo/Other  diabetes, Poorly Controlled, Type 2, Insulin DependentHypothyroidism a1c 12.8 FS 108 this morning  Renal/GU Renal disease  negative genitourinary   Musculoskeletal  (+) Arthritis , Osteoarthritis,    Abdominal   Peds  Hematology  (+) Blood dyscrasia, anemia ,   Anesthesia Other Findings HOH   Reproductive/Obstetrics negative OB ROS                             Anesthesia Physical  Anesthesia Plan  ASA: 3  Anesthesia Plan: General   Post-op Pain Management:    Induction: Intravenous  PONV Risk Score and Plan:   Airway Management Planned: Oral ETT  Additional Equipment: None  Intra-op Plan:   Post-operative Plan: Extubation in OR  Informed Consent: I have reviewed the patients History and Physical, chart, labs and discussed the procedure including the risks, benefits and alternatives for the proposed anesthesia with the patient or authorized representative who has indicated his/her understanding and acceptance.     Dental advisory given  Plan Discussed with: CRNA  Anesthesia Plan Comments:          Anesthesia Quick Evaluation

## 2020-12-27 NOTE — ED Notes (Signed)
Attempted to pharmacy no answer, message sent as well for abx

## 2020-12-27 NOTE — Anesthesia Procedure Notes (Signed)
Procedure Name: Intubation Date/Time: 12/27/2020 2:01 PM Performed by: Tacy Learn, CRNA Pre-anesthesia Checklist: Patient identified, Emergency Drugs available, Suction available, Patient being monitored and Timeout performed Patient Re-evaluated:Patient Re-evaluated prior to induction Oxygen Delivery Method: Circle system utilized Preoxygenation: Pre-oxygenation with 100% oxygen Induction Type: IV induction Laryngoscope Size: Miller and 3 Grade View: Grade I Tube type: Oral Tube size: 8.0 mm Number of attempts: 1 Airway Equipment and Method: Stylet Placement Confirmation: ETT inserted through vocal cords under direct vision, positive ETCO2, CO2 detector and breath sounds checked- equal and bilateral Secured at: 23 cm Tube secured with: Tape Dental Injury: Teeth and Oropharynx as per pre-operative assessment

## 2020-12-27 NOTE — ED Notes (Signed)
12:00 VITALS INDICATE RESPIRATIONS AT 40.  PATIENT'S ACTUAL RESPIRATIONS ARE 22. PATIENT IS SHAKING WITH TREMORS

## 2020-12-27 NOTE — ED Notes (Signed)
Spoke with pharmacy regarding abx

## 2020-12-27 NOTE — Anesthesia Postprocedure Evaluation (Signed)
Anesthesia Post Note  Patient: Alexander Banks  Procedure(s) Performed: ENDOSCOPIC RETROGRADE CHOLANGIOPANCREATOGRAPHY (ERCP) BILIARY STENT PLACEMENT  Patient location during evaluation: PACU Anesthesia Type: General Level of consciousness: awake and alert Pain management: pain level controlled Vital Signs Assessment: post-procedure vital signs reviewed and stable Respiratory status: spontaneous breathing, nonlabored ventilation, respiratory function stable and patient connected to nasal cannula oxygen Cardiovascular status: blood pressure returned to baseline and stable Postop Assessment: no apparent nausea or vomiting Anesthetic complications: no   No notable events documented.   Last Vitals:  Vitals:   12/27/20 1318 12/27/20 1446  BP: (!) 148/77 (!) 147/65  Pulse: (!) 117 (!) 120  Resp: (!) 21 (!) 23  Temp: 37.7 C 37.9 C  SpO2: 93% 99%    Last Pain:  Vitals:   12/27/20 1446  TempSrc:   PainSc: Asleep                 Nicanor Alcon

## 2020-12-27 NOTE — Transfer of Care (Signed)
Immediate Anesthesia Transfer of Care Note  Patient: Alexander Banks  Procedure(s) Performed: ENDOSCOPIC RETROGRADE CHOLANGIOPANCREATOGRAPHY (ERCP) BILIARY STENT PLACEMENT  Patient Location: PACU  Anesthesia Type:General  Level of Consciousness: awake, alert , oriented and patient cooperative  Airway & Oxygen Therapy: Patient Spontanous Breathing and Patient connected to face mask oxygen  Post-op Assessment: Report given to RN, Post -op Vital signs reviewed and stable and Patient moving all extremities X 4  Post vital signs: Reviewed and stable  Last Vitals:  Vitals Value Taken Time  BP 147/65 12/27/20 1447  Temp    Pulse 121 12/27/20 1450  Resp 24 12/27/20 1450  SpO2 99 % 12/27/20 1450  Vitals shown include unvalidated device data.  Last Pain:  Vitals:   12/27/20 1318  TempSrc: Oral  PainSc: 3       Patients Stated Pain Goal: 6 ([id number])  Complications: No notable events documented.

## 2020-12-27 NOTE — H&P (Signed)
Alexander Banks is an 65 y.o. male.   Chief Complaint: Patient is undergoing ERCP for stent exchange. HPI: Patient is 65 year old Caucasian male who was diagnosed with pancreatic carcinoma about 6 weeks ago when he was hospitalized with DKA and noted to have abnormal LFTs.  He underwent biliary stenting.  He underwent staging with EUS with FNA confirming the diagnosis.  His disease is felt to be localized.  Preop chemotherapy was recommended.  He was scheduled to undergo chemotherapy today.  Patient developed right upper quadrant abdominal pain yesterday.  His LFTs were noted to be abnormal.  He did not want to come to emergency room as he was feeling fine.  He was scheduled for ERCP with stent exchange today.  However about 2 hours before coming here he developed chills and fever.  His bilirubin and transaminases are much higher.  He is suspected to have cholangitis.  He has received IV Zosyn.  He is undergoing ERCP to remove occluded stent in place Wallstent.  Past Medical History:  Diagnosis Date   Anemia, iron deficiency    Diabetes mellitus without complication ([place])    on meds   Hearing loss    Bil hearing aids   Hyperlipidemia    Hypertension    Port-A-Cath in place 12/26/2020   Prediabetes    Sleep apnea     Past Surgical History:  Procedure Laterality Date   BILIARY STENT PLACEMENT N/A 11/09/2020   Procedure: BILIARY STENT PLACEMENT - 69 F X 5 CM STENT;  Surgeon: Rogene Houston, MD;  Location: [facility];  Service: Endoscopy;  Laterality: N/A;   COLONOSCOPY     ERCP N/A 11/09/2020   Procedure: ENDOSCOPIC RETROGRADE CHOLANGIOPANCREATOGRAPHY (ERCP);  Surgeon: Rogene Houston, MD;  Location: [facility];  Service: Endoscopy;  Laterality: N/A;   ESOPHAGOGASTRODUODENOSCOPY (EGD) WITH PROPOFOL N/A 11/30/2020   Procedure: ESOPHAGOGASTRODUODENOSCOPY (EGD) WITH PROPOFOL;  Surgeon: Milus Banister, MD;  Location: [facility];  Service: Endoscopy;  Laterality: N/A;   EUS N/A 11/30/2020   Procedure:  UPPER ENDOSCOPIC ULTRASOUND (EUS) RADIAL;  Surgeon: Milus Banister, MD;  Location: [facility];  Service: Endoscopy;  Laterality: N/A;   FINE NEEDLE ASPIRATION N/A 11/30/2020   Procedure: FINE NEEDLE ASPIRATION (FNA) LINEAR;  Surgeon: Milus Banister, MD;  Location: [facility];  Service: Endoscopy;  Laterality: N/A;   GIVENS CAPSULE STUDY     IR IMAGING GUIDED PORT INSERTION  12/18/2020   KNEE SURGERY     4 surgeries on left, 2 on right knee   SHOULDER SURGERY     right shoulder   SPHINCTEROTOMY N/A 11/09/2020   Procedure: SHORT BILIARY SPHINCTEROTOMY;  Surgeon: Rogene Houston, MD;  Location: [facility];  Service: Endoscopy;  Laterality: N/A;    Family History  Problem Relation Age of Onset   Heart attack Father    Heart attack Paternal Grandfather    Leukemia Sister    Heart attack Brother    Social History:  reports that he has been smoking cigars. He has never used smokeless tobacco. He reports current alcohol use. He reports that he does not use drugs.  Allergies:  Allergies  Allergen Reactions   Lisinopril Cough    Medications Prior to Admission  Medication Sig Dispense Refill   dronabinol (MARINOL) 5 MG capsule Take 1 capsule (5 mg total) by mouth 2 (two) times daily before a meal. 60 capsule 2   Glucosamine-Chondroitin (OSTEO BI-FLEX REGULAR STRENGTH PO) Take 2 tablets by mouth daily.  ibuprofen (ADVIL) 200 MG tablet Take 400 mg by mouth every 6 (six) hours as needed (back pain).     insulin detemir (LEVEMIR) 100 UNIT/ML FlexPen Inject 40 Units into the skin at bedtime. (Patient taking differently: Inject 25 Units into the skin at bedtime.) 45 mL 0   insulin lispro (HUMALOG KWIKPEN) 100 UNIT/ML KwikPen Inject 14-20 Units into the skin 3 (three) times daily before meals. (Patient taking differently: Inject 12-15 Units into the skin 3 (three) times daily before meals.) 45 mL 0   IRON-VITAMIN C PO Take 1 tablet by mouth daily.     levothyroxine (SYNTHROID) 175 MCG tablet  Take 1 tablet (175 mcg total) by mouth daily before breakfast.     lidocaine-prilocaine (EMLA) cream Apply a small amount to port a cath site (do not rub in) and cover with plastic wrap 1 hour prior to infusion appointments 30 g 3   Melatonin-Pyridoxine (MELATIN PO) Take by mouth. Takes 2 qhs     oxyCODONE-acetaminophen (PERCOCET/ROXICET) 5-325 MG tablet Take 1 tablet by mouth every 6 (six) hours as needed for severe pain.     prochlorperazine (COMPAZINE) 10 MG tablet Take 1 tablet (10 mg total) by mouth every 6 (six) hours as needed (Nausea or vomiting). 30 tablet 1   rosuvastatin (CRESTOR) 40 MG tablet TAKE 1 TABLET DAILY (STOP ATORVASTATIN) (Patient taking differently: Take 40 mg by mouth daily. (STOP ATORVASTATIN)) 90 tablet 1   sildenafil (VIAGRA) 100 MG tablet Take 0.5-1 tablets (50-100 mg total) by mouth daily as needed for erectile dysfunction. 5 tablet 11   Continuous Blood Gluc Receiver (FREESTYLE LIBRE 2 READER) DEVI As directed 1 each 0   Continuous Blood Gluc Sensor (FREESTYLE LIBRE 2 SENSOR) MISC 1 Piece by Does not apply route every 14 (fourteen) days. 6 each 2   ondansetron (ZOFRAN) 8 MG tablet Take 1 tablet (8 mg total) by mouth 2 (two) times daily as needed. Start on day 3 after chemotherapy. 30 tablet 1   OneTouch Delica Lancets [id number] MISC TEST ONE TIME DAILY 100 each 11    Results for orders placed or performed during the hospital encounter of 12/27/20 (from the past 48 hour(s))  Lactic acid, plasma     Status: Abnormal   Collection Time: 12/27/20 10:57 AM  Result Value Ref Range   Lactic Acid, Venous 2.8 (HH) 0.5 - 1.9 mmol/L    Comment: CRITICAL RESULT CALLED TO, READ BACK BY AND VERIFIED WITH: CRAWFORD,H AT 11:50AM ON 12/27/20 BY [facility] Performed at [facility], [address], [place], [place] [address]   Comprehensive metabolic panel     Status: Abnormal   Collection Time: 12/27/20 10:57 AM  Result Value Ref Range   Sodium 137 135 - 145 mmol/L   Potassium 3.9 3.5  - 5.1 mmol/L   Chloride 106 98 - 111 mmol/L   CO2 22 22 - 32 mmol/L   Glucose, Bld 189 (H) 70 - 99 mg/dL    Comment: Glucose reference range applies only to samples taken after fasting for at least 8 hours.   BUN 23 8 - 23 mg/dL   Creatinine, Ser 1.11 0.61 - 1.24 mg/dL   Calcium 8.2 (L) 8.9 - 10.3 mg/dL   Total Protein 6.8 6.5 - 8.1 g/dL   Albumin 3.2 (L) 3.5 - 5.0 g/dL   AST 195 (H) 15 - 41 U/L   ALT 183 (H) 0 - 44 U/L   Alkaline Phosphatase 342 (H) 38 - 126 U/L   Total Bilirubin  4.5 (H) 0.3 - 1.2 mg/dL   GFR, Estimated >60 >60 mL/min    Comment: (NOTE) Calculated using the CKD-EPI Creatinine Equation (2021)    Anion gap 9 5 - 15    Comment: Performed at [facility], [address], [place], [place] [address]  CBC with Differential     Status: Abnormal   Collection Time: 12/27/20 10:57 AM  Result Value Ref Range   WBC 13.5 (H) 4.0 - 10.5 K/uL   RBC 4.32 4.22 - 5.81 MIL/uL   Hemoglobin 13.9 13.0 - 17.0 g/dL   HCT 42.6 39.0 - 52.0 %   MCV 98.6 80.0 - 100.0 fL   MCH 32.2 26.0 - 34.0 pg   MCHC 32.6 30.0 - 36.0 g/dL   RDW 13.7 11.5 - 15.5 %   Platelets 248 150 - 400 K/uL   nRBC 0.1 0.0 - 0.2 %   Neutrophils Relative % 94 %   Neutro Abs 12.7 (H) 1.7 - 7.7 K/uL   Lymphocytes Relative 1 %   Lymphs Abs 0.1 (L) 0.7 - 4.0 K/uL   Monocytes Relative 3 %   Monocytes Absolute 0.4 0.1 - 1.0 K/uL   Eosinophils Relative 1 %   Eosinophils Absolute 0.2 0.0 - 0.5 K/uL   Basophils Relative 0 %   Basophils Absolute 0.1 0.0 - 0.1 K/uL   Immature Granulocytes 1 %   Abs Immature Granulocytes 0.11 (H) 0.00 - 0.07 K/uL    Comment: Performed at [facility], [address] [place], [place] [address]  Lipase, blood     Status: None   Collection Time: 12/27/20 11:26 AM  Result Value Ref Range   Lipase 17 11 - 51 U/L    Comment: Performed at [facility], [address] [place], [place] [address]  Culture, blood (Routine X 2) w Reflex to ID Panel     Status: None (Preliminary result)    Collection Time: 12/27/20 11:26 AM   Specimen: BLOOD  Result Value Ref Range   Specimen Description BLOOD RIGHT ANTECUBITAL    Special Requests      BOTTLES DRAWN AEROBIC AND ANAEROBIC Blood Culture adequate volume Performed at [facility], [address], [place], [place] [address]    Culture PENDING    Report Status PENDING   Urinalysis, Routine w reflex microscopic Urine, Clean Catch     Status: Abnormal   Collection Time: 12/27/20 11:37 AM  Result Value Ref Range   Color, Urine AMBER (A) YELLOW    Comment: BIOCHEMICALS MAY BE AFFECTED BY COLOR   APPearance HAZY (A) CLEAR   Specific Gravity, Urine 1.036 (H) 1.005 - 1.030   pH 5.0 5.0 - 8.0   Glucose, UA 50 (A) NEGATIVE mg/dL   Hgb urine dipstick MODERATE (A) NEGATIVE   Bilirubin Urine MODERATE (A) NEGATIVE   Ketones, ur 5 (A) NEGATIVE mg/dL   Protein, ur 100 (A) NEGATIVE mg/dL   Nitrite NEGATIVE NEGATIVE   Leukocytes,Ua NEGATIVE NEGATIVE   RBC / HPF 11-20 0 - 5 RBC/hpf   WBC, UA 6-10 0 - 5 WBC/hpf   Bacteria, UA NONE SEEN NONE SEEN   Squamous Epithelial / LPF 0-5 0 - 5   Mucus PRESENT    Non Squamous Epithelial 0-5 (A) NONE SEEN    Comment: Performed at [facility], [address] [place], [place] [address]  Resp Panel by RT-PCR (Flu A&B, Covid) Nasopharyngeal Swab     Status: None   Collection Time: 12/27/20 11:38 AM   Specimen: Nasopharyngeal  Swab; Nasopharyngeal(NP) swabs in vial transport medium  Result Value Ref Range   SARS Coronavirus 2 by RT PCR NEGATIVE NEGATIVE    Comment: (NOTE) SARS-CoV-2 target nucleic acids are NOT DETECTED.  The SARS-CoV-2 RNA is generally detectable in upper respiratory specimens during the acute phase of infection. The lowest concentration of SARS-CoV-2 viral copies this assay can detect is 138 copies/mL. A negative result does not preclude SARS-Cov-2 infection and should not be used as the sole basis for treatment or other patient management decisions. A negative result may occur  with  improper specimen collection/handling, submission of specimen other than nasopharyngeal swab, presence of viral mutation(s) within the areas targeted by this assay, and inadequate number of viral copies(<138 copies/mL). A negative result must be combined with clinical observations, patient history, and epidemiological information. The expected result is Negative.  Fact Sheet for Patients:  EntrepreneurPulse.com.au  Fact Sheet for Healthcare Providers:  IncredibleEmployment.be  This test is no t yet approved or cleared by the [place] FDA and  has been authorized for detection and/or diagnosis of SARS-CoV-2 by FDA under an Emergency Use Authorization (EUA). This EUA will remain  in effect (meaning this test can be used) for the duration of the COVID-19 declaration under Section 564(b)(1) of the Act, 21 U.S.C.section 360bbb-3(b)(1), unless the authorization is terminated  or revoked sooner.       Influenza A by PCR NEGATIVE NEGATIVE   Influenza B by PCR NEGATIVE NEGATIVE    Comment: (NOTE) The Xpert Xpress SARS-CoV-2/FLU/RSV plus assay is intended as an aid in the diagnosis of influenza from Nasopharyngeal swab specimens and should not be used as a sole basis for treatment. Nasal washings and aspirates are unacceptable for Xpert Xpress SARS-CoV-2/FLU/RSV testing.  Fact Sheet for Patients: EntrepreneurPulse.com.au  Fact Sheet for Healthcare Providers: IncredibleEmployment.be  This test is not yet approved or cleared by the [place] FDA and has been authorized for detection and/or diagnosis of SARS-CoV-2 by FDA under an Emergency Use Authorization (EUA). This EUA will remain in effect (meaning this test can be used) for the duration of the COVID-19 declaration under Section 564(b)(1) of the Act, 21 U.S.C. section 360bbb-3(b)(1), unless the authorization is terminated or revoked.  Performed  at [facility], [address], [place], [place] [address]   Culture, blood (Routine X 2) w Reflex to ID Panel     Status: None (Preliminary result)   Collection Time: 12/27/20 12:13 PM   Specimen: BLOOD  Result Value Ref Range   Specimen Description BLOOD LEFT ANTECUBITAL    Special Requests      BOTTLES DRAWN AEROBIC AND ANAEROBIC Blood Culture adequate volume Performed at [facility], [address], [place], [place] [address]    Culture PENDING    Report Status PENDING   Protime-INR     Status: None   Collection Time: 12/27/20 12:13 PM  Result Value Ref Range   Prothrombin Time 14.5 11.4 - 15.2 seconds   INR 1.1 0.8 - 1.2    Comment: (NOTE) INR goal varies based on device and disease states. Performed at [facility], [address] [place], [place] [address]   APTT     Status: None   Collection Time: 12/27/20 12:13 PM  Result Value Ref Range   aPTT 30 24 - 36 seconds    Comment: Performed at [facility], [address] [place],  [address]  Glucose, capillary     Status: Abnormal   Collection Time: 12/27/20  1:14 PM  Result Value Ref Range   Glucose-Capillary 164 (H) 70 - 99 mg/dL    Comment: Glucose reference range applies only to samples taken after fasting for at least 8 hours.   DG Chest Port 1 View  Result Date: 12/27/2020 CLINICAL DATA:  Fever. EXAM: PORTABLE CHEST 1 VIEW COMPARISON:  March 13, 2007. FINDINGS: The heart size and mediastinal contours are within normal limits. Both lungs are clear. Right internal jugular Port-A-Cath is noted with distal tip in expected position of the SVC. The visualized skeletal structures are unremarkable. IMPRESSION: No active disease. Electronically Signed   By: Marijo Conception M.D.   On: 12/27/2020 12:14    Review of Systems  Blood pressure (!) 148/77, pulse (!) 117, temperature 99.9 F (37.7 C), temperature source Oral, resp. rate (!) 21, height 6' 2.5" (1.892 m), weight 85.7 kg, SpO2 93 %. Physical  Exam Constitutional:      Comments: Patient is having rigors. He responds appropriately to questions.   Eyes:     General: Scleral icterus present.     Conjunctiva/sclera: Conjunctivae normal.  Cardiovascular:     Rate and Rhythm: Normal rate and regular rhythm.     Heart sounds: Normal heart sounds. No murmur heard. Pulmonary:     Effort: Pulmonary effort is normal.     Breath sounds: Normal breath sounds.  Abdominal:     Comments: Abdomen is symmetrical and soft with mild  Musculoskeletal:        General: No swelling.     Cervical back: Neck supple.  Lymphadenopathy:     Cervical: No cervical adenopathy.  Skin:    General: Skin is warm and dry.     Assessment/Plan  Cholangitis secondary to occluded biliary stent in a patient with known pancreatic adenocarcinoma. Proceed with ERCP with stent exchange. Will place fully covered Wallstent.  He will need to be in the hospital for IV antibiotics.Hildred Laser, MD 12/27/2020, 1:48 PM

## 2020-12-27 NOTE — ED Notes (Addendum)
Attempted to call pharmacy for medication no answer. ABX

## 2020-12-27 NOTE — ED Notes (Signed)
Report given to same day sx.  Pt taken to same day sx.

## 2020-12-27 NOTE — H&P (Signed)
History and Physical  Alexander Banks [id number] DOB: 02/07/1956 DOA: 12/27/2020   PCP: [name], MD   Patient coming from: Home  Chief Complaint: fevers, chills epigastric pain  HPI:  Alexander Banks is a 65 y.o. male with medical history of DM2, pancreatic adenocarcinoma presents with 1 day history of fevers, chills, epigastric pain.  The patient was recently admitted to the hospital from 11/06/2020 to 11/10/2020.  He was initially admitted for DKA.  The patient was noted to have elevated LFTs.  Abdominal ultrasound was obtained and showed biliary ductal dilatation.  Further imaging suggested a pancreatic head mass resulting in biliary obstruction.  He underwent ERCP with brushing cytology and placement of a plastic biliary stent on 11/09/2020.  Bile duct cytology was negative.  The patient subsequently followed up with [name] who performed an EUS with FNA on 11/30/2020.  FNA was positive for adenocarcinoma.  He was seen by [name] of oncolgy and also by [name] at [facility] surgery.  Patient's condition was felt to be resectable.  Decision was made for preop chemotherapy for 3 months.  He had Port-A-Cath placed 12/18/20.  The patient was scheduled to start neoadjuvant chemotherapy on 12/27/2020 under the care of [name].  Unfortunately, the patient developed fevers, chills, and epigastric pain on 12/26/2020.  He contacted Dr. [name] whom ordered blood work including LFTs which were noted to be elevated.  There was concern for cholangitis.  The patient had 2 episodes of nausea and vomiting on 12/26/2020 without any blood.  Because of his continued pain and fevers and chills, the patient was instructed to go to the emergency department for further evaluation. Patient denies any headache, chest pain, shortness of breath, cough, hemoptysis, hematemesis, dysuria, hematuria, hematochezia, melena.  In the emergency department, the patient was febrile up to 101.4  F.  He was tachycardic into the 140s.  He was hemodynamically stable with oxygen saturation 93-95% on room air.  The patient was started on Zosyn after blood cultures were obtained.  BMP was unremarkable with sodium 137, potassium 3.9, serum creatinine 1.21.  AST 197, ALT 193, alkaline phosphatase 342, total bilirubin 4.2, lipase 17.  WBC 13.5, hemoglobin 13.9, platelets 290,000.  Chest x-ray was negative for infiltrates.  GI was consulted to assist with management.  Assessment/Plan: Severe Sepsis -Secondary to cholangitis -Continue Zosyn -Lactic acid 2.8 -Continue IV fluids  Acute cholangitis -GI consulted -Continue Zosyn -Follow blood cultures  Diabetes mellitus type 2, uncontrolled with hyperglycemia -Start reduced dose Levemir -NovoLog sliding scale  Hyperlipidemia -Holding Crestor temporarily secondary to elevated LFTs  Hypothyroidism -Continue levothyroxine  Tobacco abuse -Tobacco cessation discussed  Pancreatic adenocarcinoma -Initially scheduled for first chemotherapy session on 12/27/2020      Past Medical History:  Diagnosis Date   Anemia, iron deficiency    Diabetes mellitus without complication (HCC)    on meds   Hearing loss    Bil hearing aids   Hyperlipidemia    Hypertension    Port-A-Cath in place 12/26/2020   Prediabetes    Sleep apnea    Past Surgical History:  Procedure Laterality Date   BILIARY STENT PLACEMENT N/A 11/09/2020   Procedure: BILIARY STENT PLACEMENT - 30 F X 5 CM STENT;  Surgeon: Rogene Houston, MD;  Location: [facility];  Service: Endoscopy;  Laterality: N/A;   COLONOSCOPY     ERCP N/A 11/09/2020   Procedure: ENDOSCOPIC RETROGRADE CHOLANGIOPANCREATOGRAPHY (ERCP);  Surgeon: Hildred Laser  U, MD;  Location: [facility];  Service: Endoscopy;  Laterality: N/A;   ESOPHAGOGASTRODUODENOSCOPY (EGD) WITH PROPOFOL N/A 11/30/2020   Procedure: ESOPHAGOGASTRODUODENOSCOPY (EGD) WITH PROPOFOL;  Surgeon: Milus Banister, MD;  Location: [facility];   Service: Endoscopy;  Laterality: N/A;   EUS N/A 11/30/2020   Procedure: UPPER ENDOSCOPIC ULTRASOUND (EUS) RADIAL;  Surgeon: Milus Banister, MD;  Location: [facility];  Service: Endoscopy;  Laterality: N/A;   FINE NEEDLE ASPIRATION N/A 11/30/2020   Procedure: FINE NEEDLE ASPIRATION (FNA) LINEAR;  Surgeon: Milus Banister, MD;  Location: [facility];  Service: Endoscopy;  Laterality: N/A;   GIVENS CAPSULE STUDY     IR IMAGING GUIDED PORT INSERTION  12/18/2020   KNEE SURGERY     4 surgeries on left, 2 on right knee   SHOULDER SURGERY     right shoulder   SPHINCTEROTOMY N/A 11/09/2020   Procedure: SHORT BILIARY SPHINCTEROTOMY;  Surgeon: Rogene Houston, MD;  Location: [facility];  Service: Endoscopy;  Laterality: N/A;   Social History:  reports that he has been smoking cigars. He has never used smokeless tobacco. He reports current alcohol use. He reports that he does not use drugs.   Family History  Problem Relation Age of Onset   Heart attack Father    Heart attack Paternal Grandfather    Leukemia Sister    Heart attack Brother      Allergies  Allergen Reactions   Lisinopril Cough     Prior to Admission medications   Medication Sig Start Date End Date Taking? Authorizing Provider  dronabinol (MARINOL) 5 MG capsule Take 1 capsule (5 mg total) by mouth 2 (two) times daily before a meal. 12/08/20  Yes Pennington, Rebekah M, PA-C  Glucosamine-Chondroitin (OSTEO BI-FLEX REGULAR STRENGTH PO) Take 2 tablets by mouth daily.   Yes [provider]  ibuprofen (ADVIL) 200 MG tablet Take 400 mg by mouth every 6 (six) hours as needed (back pain).   Yes [provider]  insulin detemir (LEVEMIR) 100 UNIT/ML FlexPen Inject 40 Units into the skin at bedtime. Patient taking differently: Inject 25 Units into the skin at bedtime. 11/22/20  Yes Reardon, Juanetta Beets, NP  insulin lispro (HUMALOG KWIKPEN) 100 UNIT/ML KwikPen Inject 14-20 Units into the skin 3 (three) times daily before  meals. Patient taking differently: Inject 12-15 Units into the skin 3 (three) times daily before meals. 11/22/20  Yes Reardon, Juanetta Beets, NP  IRON-VITAMIN C PO Take 1 tablet by mouth daily.   Yes [provider]  levothyroxine (SYNTHROID) 175 MCG tablet Take 1 tablet (175 mcg total) by mouth daily before breakfast. 11/10/20  Yes [name], [name], MD  lidocaine-prilocaine (EMLA) cream Apply a small amount to port a cath site (do not rub in) and cover with plastic wrap 1 hour prior to infusion appointments 12/26/20  Yes Derek Jack, MD  Melatonin-Pyridoxine (MELATIN PO) Take by mouth. Takes 2 qhs   Yes [provider]  oxyCODONE-acetaminophen (PERCOCET/ROXICET) 5-325 MG tablet Take 1 tablet by mouth every 6 (six) hours as needed for severe pain.   Yes [provider]  prochlorperazine (COMPAZINE) 10 MG tablet Take 1 tablet (10 mg total) by mouth every 6 (six) hours as needed (Nausea or vomiting). 12/26/20  Yes Derek Jack, MD  rosuvastatin (CRESTOR) 40 MG tablet TAKE 1 TABLET DAILY (STOP ATORVASTATIN) Patient taking differently: Take 40 mg by mouth daily. (STOP ATORVASTATIN) 07/25/20  Yes [name], MD  sildenafil (VIAGRA) 100 MG tablet Take 0.5-1  tablets (50-100 mg total) by mouth daily as needed for erectile dysfunction. 11/17/20  Yes [name], MD  Continuous Blood Gluc Receiver (FREESTYLE LIBRE 2 READER) DEVI As directed 11/13/20   Cassandria Anger, MD  Continuous Blood Gluc Sensor (FREESTYLE LIBRE 2 SENSOR) MISC 1 Piece by Does not apply route every 14 (fourteen) days. 11/22/20   Brita Romp, NP  ondansetron (ZOFRAN) 8 MG tablet Take 1 tablet (8 mg total) by mouth 2 (two) times daily as needed. Start on day 3 after chemotherapy. 12/26/20   Derek Jack, MD  OneTouch Delica Lancets [id number] MISC TEST ONE TIME DAILY 11/30/19   [name], MD    Review of Systems:  Constitutional:  No weight loss, night sweats, Fevers, chills, fatigue.   Head&Eyes: No headache.  No vision loss.  No eye pain or scotoma ENT:  No Difficulty swallowing,Tooth/dental problems,Sore throat,  No ear ache, post nasal drip,  Cardio-vascular:  No chest pain, Orthopnea, PND, swelling in lower extremities,  dizziness, palpitations  GI:  No  diarrhea, loss of appetite, hematochezia, melena, heartburn, indigestion, Resp:  No shortness of breath with exertion or at rest. No cough. No coughing up of blood .No wheezing.No chest wall deformity  Skin:  no rash or lesions.  GU:  no dysuria, change in color of urine, no urgency or frequency. No flank pain.  Musculoskeletal:  No joint pain or swelling. No decreased range of motion. No back pain.  Psych:  No change in mood or affect. No depression or anxiety. Neurologic: No headache, no dysesthesia, no focal weakness, no vision loss. No syncope  Physical Exam: Vitals:   12/27/20 1115 12/27/20 1130 12/27/20 1200 12/27/20 1230  BP: (!) 148/76 (!) 128/97 (!) 189/88 (!) 152/129  Pulse: (!) 117 (!) 115 (!) 147 (!) 123  Resp: (!) 25 (!) 24 (!) 40 (!) 25  Temp:      TempSrc:      SpO2: 93% 94% 96% 91%  Weight:      Height:       General:  A&O x 3, NAD, nontoxic, pleasant/cooperative Head/Eye: No conjunctival hemorrhage, no icterus, [place]/AT, No nystagmus ENT:  No icterus,  No thrush, good dentition, no pharyngeal exudate Neck:  No masses, no lymphadenpathy, no bruits CV:  RRR, no rub, no gallop, no S3 Lung:  CTAB, good air movement, no wheeze, no rhonchi Abdomen: soft/epigastric and RUQ abd pain, +BS, nondistended, no peritoneal signs Ext: No cyanosis, No rashes, No petechiae, No lymphangitis, No edema Neuro: CNII-XII intact, strength 4/5 in bilateral upper and lower extremities, no dysmetria  Labs on Admission:  Basic Metabolic Panel: Recent Labs  Lab 12/22/20 0808 12/27/20 1057  NA 143 137  K 4.4 3.9  CL 108* 106  CO2 22 22  GLUCOSE 114* 189*  BUN 14 23  CREATININE 0.86 1.11  CALCIUM 9.1  8.2*   Liver Function Tests: Recent Labs  Lab 12/22/20 0808 12/26/20 1826 12/27/20 1057  AST 17 365* 195*  ALT 46* 168* 183*  ALKPHOS 217* 339* 342*  BILITOT 0.2 1.6* 4.5*  PROT 6.4 7.0 6.8  ALBUMIN 3.6* 3.5 3.2*   Recent Labs  Lab 12/27/20 1126  LIPASE 17   No results for input(s): AMMONIA in the last 168 hours. CBC: Recent Labs  Lab 12/27/20 1057  WBC 13.5*  NEUTROABS 12.7*  HGB 13.9  HCT 42.6  MCV 98.6  PLT 248   Coagulation Profile: No results for input(s): INR, PROTIME in the last  168 hours. Cardiac Enzymes: No results for input(s): CKTOTAL, CKMB, CKMBINDEX, TROPONINI in the last 168 hours. BNP: Invalid input(s): POCBNP CBG: No results for input(s): GLUCAP in the last 168 hours. Urine analysis:    Component Value Date/Time   COLORURINE AMBER (A) 12/27/2020 1137   APPEARANCEUR HAZY (A) 12/27/2020 1137   LABSPEC 1.036 (H) 12/27/2020 1137   PHURINE 5.0 12/27/2020 1137   GLUCOSEU 50 (A) 12/27/2020 1137   HGBUR MODERATE (A) 12/27/2020 1137   BILIRUBINUR MODERATE (A) 12/27/2020 1137   KETONESUR 5 (A) 12/27/2020 1137   PROTEINUR 100 (A) 12/27/2020 1137   NITRITE NEGATIVE 12/27/2020 1137   LEUKOCYTESUR NEGATIVE 12/27/2020 1137   Sepsis Labs: '@LABRCNTIP'$ (procalcitonin:4,lacticidven:4) ) Recent Results (from the past 240 hour(s))  Resp Panel by RT-PCR (Flu A&B, Covid) Nasopharyngeal Swab     Status: None   Collection Time: 12/27/20 11:38 AM   Specimen: Nasopharyngeal Swab; Nasopharyngeal(NP) swabs in vial transport medium  Result Value Ref Range Status   SARS Coronavirus 2 by RT PCR NEGATIVE NEGATIVE Final    Comment: (NOTE) SARS-CoV-2 target nucleic acids are NOT DETECTED.  The SARS-CoV-2 RNA is generally detectable in upper respiratory specimens during the acute phase of infection. The lowest concentration of SARS-CoV-2 viral copies this assay can detect is 138 copies/mL. A negative result does not preclude SARS-Cov-2 infection and should not be used  as the sole basis for treatment or other patient management decisions. A negative result may occur with  improper specimen collection/handling, submission of specimen other than nasopharyngeal swab, presence of viral mutation(s) within the areas targeted by this assay, and inadequate number of viral copies(<138 copies/mL). A negative result must be combined with clinical observations, patient history, and epidemiological information. The expected result is Negative.  Fact Sheet for Patients:  EntrepreneurPulse.com.au  Fact Sheet for Healthcare Providers:  IncredibleEmployment.be  This test is no t yet approved or cleared by the [place] FDA and  has been authorized for detection and/or diagnosis of SARS-CoV-2 by FDA under an Emergency Use Authorization (EUA). This EUA will remain  in effect (meaning this test can be used) for the duration of the COVID-19 declaration under Section 564(b)(1) of the Act, 21 U.S.C.section 360bbb-3(b)(1), unless the authorization is terminated  or revoked sooner.       Influenza A by PCR NEGATIVE NEGATIVE Final   Influenza B by PCR NEGATIVE NEGATIVE Final    Comment: (NOTE) The Xpert Xpress SARS-CoV-2/FLU/RSV plus assay is intended as an aid in the diagnosis of influenza from Nasopharyngeal swab specimens and should not be used as a sole basis for treatment. Nasal washings and aspirates are unacceptable for Xpert Xpress SARS-CoV-2/FLU/RSV testing.  Fact Sheet for Patients: EntrepreneurPulse.com.au  Fact Sheet for Healthcare Providers: IncredibleEmployment.be  This test is not yet approved or cleared by the [place] FDA and has been authorized for detection and/or diagnosis of SARS-CoV-2 by FDA under an Emergency Use Authorization (EUA). This EUA will remain in effect (meaning this test can be used) for the duration of the COVID-19 declaration under Section 564(b)(1)  of the Act, 21 U.S.C. section 360bbb-3(b)(1), unless the authorization is terminated or revoked.  Performed at [facility], [address], [place], [place] [address]      Radiological Exams on Admission: DG Chest [facility] 1 View  Result Date: 12/27/2020 CLINICAL DATA:  Fever. EXAM: PORTABLE CHEST 1 VIEW COMPARISON:  March 13, 2007. FINDINGS: The heart size and mediastinal contours are within normal limits. Both lungs are clear. Right internal jugular  Port-A-Cath is noted with distal tip in expected position of the SVC. The visualized skeletal structures are unremarkable. IMPRESSION: No active disease. Electronically Signed   By: Marijo Conception M.D.   On: 12/27/2020 12:14    EKG: Independently reviewed. Sinus tach.  Nonspecific ST change    Time spent:60 minutes Code Status:   FULL Family Communication:  No Family at bedside Disposition Plan: expect 2-3 day hospitalization Consults called: GI DVT Prophylaxis: SCDs  Orson Eva, DO  [facility] Pager [phone]  If 7PM-7AM, please contact night-coverage [email or web address] Password [facility] 12/27/2020, 12:49 PM

## 2020-12-27 NOTE — ED Notes (Signed)
Attempted to call pharmacy for antibotics no answer

## 2020-12-27 NOTE — Sepsis Progress Note (Signed)
Code sepsis protocol being monitored by elink. 1st Lactic acid 2.8. Blood cx obtained. Antibiotics and fluid resuscitation given.  Will need repeat lactic acid. He is not hypotensive. And will be having a biliary tree stent removal today.

## 2020-12-27 NOTE — Progress Notes (Signed)
Patient feels much better.  He is not having rigors anymore. He states abdominal pain has resolved. He is hungry.  Plain abdominal film reveals plastic stent possibly in third and fourth part of duodenum. I did not see the stent during fluoroscopy for ERCP. May have to remove the stent endoscopically.  Diet advanced to modified carb diet.

## 2020-12-28 ENCOUNTER — Telehealth (HOSPITAL_COMMUNITY): Payer: BC Managed Care – PPO | Admitting: Genetic Counselor

## 2020-12-28 ENCOUNTER — Encounter (HOSPITAL_COMMUNITY): Payer: Self-pay | Admitting: Internal Medicine

## 2020-12-28 ENCOUNTER — Encounter (HOSPITAL_COMMUNITY): Payer: Self-pay

## 2020-12-28 DIAGNOSIS — K8309 Other cholangitis: Secondary | ICD-10-CM

## 2020-12-28 DIAGNOSIS — C259 Malignant neoplasm of pancreas, unspecified: Secondary | ICD-10-CM

## 2020-12-28 DIAGNOSIS — R6520 Severe sepsis without septic shock: Secondary | ICD-10-CM

## 2020-12-28 DIAGNOSIS — K7200 Acute and subacute hepatic failure without coma: Secondary | ICD-10-CM

## 2020-12-28 DIAGNOSIS — A4151 Sepsis due to Escherichia coli [E. coli]: Secondary | ICD-10-CM

## 2020-12-28 LAB — BLOOD CULTURE ID PANEL (REFLEXED) - BCID2
A.calcoaceticus-baumannii: NOT DETECTED
Bacteroides fragilis: NOT DETECTED
CTX-M ESBL: NOT DETECTED
Candida albicans: NOT DETECTED
Candida auris: NOT DETECTED
Candida glabrata: NOT DETECTED
Candida krusei: NOT DETECTED
Candida parapsilosis: NOT DETECTED
Candida tropicalis: NOT DETECTED
Carbapenem resist OXA 48 LIKE: NOT DETECTED
Carbapenem resistance IMP: NOT DETECTED
Carbapenem resistance KPC: NOT DETECTED
Carbapenem resistance NDM: NOT DETECTED
Carbapenem resistance VIM: NOT DETECTED
Cryptococcus neoformans/gattii: NOT DETECTED
Enterobacter cloacae complex: NOT DETECTED
Enterobacterales: DETECTED — AB
Enterococcus Faecium: NOT DETECTED
Enterococcus faecalis: NOT DETECTED
Escherichia coli: DETECTED — AB
Haemophilus influenzae: NOT DETECTED
Klebsiella aerogenes: NOT DETECTED
Klebsiella oxytoca: DETECTED — AB
Klebsiella pneumoniae: DETECTED — AB
Listeria monocytogenes: NOT DETECTED
Neisseria meningitidis: NOT DETECTED
Proteus species: NOT DETECTED
Pseudomonas aeruginosa: NOT DETECTED
Salmonella species: NOT DETECTED
Serratia marcescens: NOT DETECTED
Staphylococcus aureus (BCID): NOT DETECTED
Staphylococcus epidermidis: NOT DETECTED
Staphylococcus lugdunensis: NOT DETECTED
Staphylococcus species: NOT DETECTED
Stenotrophomonas maltophilia: NOT DETECTED
Streptococcus agalactiae: NOT DETECTED
Streptococcus pneumoniae: NOT DETECTED
Streptococcus pyogenes: NOT DETECTED
Streptococcus species: NOT DETECTED

## 2020-12-28 LAB — CBC
HCT: 37.5 % — ABNORMAL LOW (ref 39.0–52.0)
Hemoglobin: 11.8 g/dL — ABNORMAL LOW (ref 13.0–17.0)
MCH: 31.3 pg (ref 26.0–34.0)
MCHC: 31.5 g/dL (ref 30.0–36.0)
MCV: 99.5 fL (ref 80.0–100.0)
Platelets: 177 10*3/uL (ref 150–400)
RBC: 3.77 MIL/uL — ABNORMAL LOW (ref 4.22–5.81)
RDW: 13.8 % (ref 11.5–15.5)
WBC: 19.5 10*3/uL — ABNORMAL HIGH (ref 4.0–10.5)
nRBC: 0 % (ref 0.0–0.2)

## 2020-12-28 LAB — COMPREHENSIVE METABOLIC PANEL
ALT: 105 U/L — ABNORMAL HIGH (ref 0–44)
AST: 83 U/L — ABNORMAL HIGH (ref 15–41)
Albumin: 2.4 g/dL — ABNORMAL LOW (ref 3.5–5.0)
Alkaline Phosphatase: 224 U/L — ABNORMAL HIGH (ref 38–126)
Anion gap: 8 (ref 5–15)
BUN: 30 mg/dL — ABNORMAL HIGH (ref 8–23)
CO2: 22 mmol/L (ref 22–32)
Calcium: 7.8 mg/dL — ABNORMAL LOW (ref 8.9–10.3)
Chloride: 107 mmol/L (ref 98–111)
Creatinine, Ser: 1.11 mg/dL (ref 0.61–1.24)
GFR, Estimated: >60 mL/min (ref >60)
Glucose, Bld: 198 mg/dL — ABNORMAL HIGH (ref 70–99)
Potassium: 4.2 mmol/L (ref 3.5–5.1)
Sodium: 137 mmol/L (ref 135–145)
Total Bilirubin: 3.3 mg/dL — ABNORMAL HIGH (ref 0.3–1.2)
Total Protein: 5.2 g/dL — ABNORMAL LOW (ref 6.5–8.1)

## 2020-12-28 LAB — GLUCOSE, CAPILLARY
Glucose-Capillary: 209 mg/dL — ABNORMAL HIGH (ref 70–99)
Glucose-Capillary: 236 mg/dL — ABNORMAL HIGH (ref 70–99)
Glucose-Capillary: 263 mg/dL — ABNORMAL HIGH (ref 70–99)
Glucose-Capillary: 289 mg/dL — ABNORMAL HIGH (ref 70–99)

## 2020-12-28 LAB — URINE CULTURE: Culture: 4000 — AB

## 2020-12-28 MED ORDER — CHLORHEXIDINE GLUCONATE CLOTH 2 % EX PADS
6.0000 | MEDICATED_PAD | Freq: Every day | CUTANEOUS | Status: DC
  Administered 2020-12-28: 6 via TOPICAL

## 2020-12-28 NOTE — Progress Notes (Signed)
Date and time results received: 12/28/20 0150 (use smartphrase ".now" to insert current time)  Test: blood cultures Critical Value: ecoli, klebsiella oxypoca pneumoniae  Name of Provider Notified: Dr. Myna Hidalgo  Orders Received? Or Actions Taken?: no new orders

## 2020-12-28 NOTE — Progress Notes (Signed)
   12/28/20 0018 Vitals Temp 98.2 F (36.8 C) Temp Source Oral BP 133/87 BP Location Right Arm BP Method Automatic Pulse Rate 92 Pulse Rate Source Dinamap Resp 20 MEWS COLOR MEWS Score Color Green Oxygen Therapy SpO2 96 % O2 Device CPAP MEWS Score MEWS Temp 0 MEWS Systolic 0 MEWS Pulse 0 MEWS RR 0 MEWS LOC 0 MEWS Score 0 Provider Notification Provider Name/Title Dr. Myna Hidalgo Date Provider Notified 12/28/20 Time Provider Notified 0020 Notification Type Page Notification Reason Critical result Test performed and critical result positive blood cultures for gram variable rod/ aerobic bottle only Date Critical Result Received 12/28/20 Time Critical Result Received 0015

## 2020-12-28 NOTE — Progress Notes (Signed)
PROGRESS NOTE    Alexander Banks  [id number] DOB: 1955-09-10 DOA: 12/27/2020 PCP: [name], MD     Brief Narrative:  Alexander Banks is a 65 y.o. male with medical history of DM2, pancreatic adenocarcinoma presents with 1 day history of fevers, chills, epigastric pain.  The patient was recently admitted to the hospital from 11/06/2020 to 11/10/2020.  He was initially admitted for DKA.  The patient was noted to have elevated LFTs.  Abdominal ultrasound was obtained and showed biliary ductal dilatation.  Further imaging suggested a pancreatic head mass resulting in biliary obstruction.  He underwent ERCP with brushing cytology and placement of a plastic biliary stent on 11/09/2020.  Bile duct cytology was negative.  The patient subsequently followed up with [name] who performed an EUS with FNA on 11/30/2020.  FNA was positive for adenocarcinoma.  He was seen by [name] of [facility] and also by [name] at [facility] surgery.  Patient's condition was felt to be resectable.  Decision was made for preop chemotherapy for 3 months.  He had Port-A-Cath placed 12/18/20.  The patient was scheduled to start neoadjuvant chemotherapy on 12/27/2020 under the care of [name].  Unfortunately, the patient developed fevers, chills, and epigastric pain on 12/26/2020.  He contacted Dr. [name] whom ordered blood work including LFTs which were noted to be elevated.  There was concern for cholangitis.  The patient had 2 episodes of nausea and vomiting on 12/26/2020 without any blood.  Because of his continued pain and fevers and chills, the patient was instructed to go to the emergency department for further evaluation.  Patient underwent ERCP with stent placement on 8/10.  New events last 24 hours / Subjective: Feeling much better this morning, has some soreness in his right upper quadrant, no longer having fevers or rigors.  No nausea or vomiting.  Afebrile overnight.  Assessment &  Plan:   Active Problems:   Sepsis due to undetermined organism ([place])   Severe sepsis secondary to cholangitis and bacteremia, present on admission -Status post ERCP with stent placement 8/10.  Plain abdominal film revealed plastic stent possibly in the duodenum, was not seen during fluoroscopy for ERCP.  This may have to be removed endoscopically -Blood culture positive for Enterobacter, E. coli, Klebsiella -Continue IV Zosyn  Pancreatic adenocarcinoma -Follow-up with [name], first chemotherapy session now delayed due to this hospital stay  Diabetes mellitus type 2 -Continue Levemir, sliding scale insulin  Hyperlipidemia -Crestor on hold due to elevated LFTs  Hypothyroidism -Continue Synthroid  DVT prophylaxis:  enoxaparin (LOVENOX) injection 40 mg Start: 12/28/20 1100  Code Status:     Code Status Orders  (From admission, onward)           Start     Ordered   12/27/20 1750  Full code  Continuous        12/27/20 1750           Code Status History     Date Active Date Inactive Code Status Order ID Comments User Context   11/07/2020 0141 11/10/2020 1736 Full Code [id number]  Bernadette Hoit, DO Inpatient      Advance Directive Documentation    Flowsheet Row Most Recent Value  Type of Advance Directive Healthcare Power of Attorney, Living will  Pre-existing out of facility DNR order (yellow form or pink MOST form) --  "MOST" Form in Place? --      Family Communication: None at bedside Disposition Plan:  Status is: Inpatient  Remains inpatient appropriate because:IV treatments appropriate due to intensity of illness or inability to take PO  Dispo: The patient is from: Home              Anticipated d/c is to: Home              Patient currently is not medically stable to d/c.   Difficult to place patient No      Consultants:  GI  Procedures:  ERCP 8/10  Antimicrobials:  Anti-infectives (From admission, onward)    Start      Dose/Rate Route Frequency Ordered Stop   12/28/20 0600  ceFAZolin (ANCEF) IVPB 2g/100 mL premix  Status:  Discontinued        2 g 200 mL/hr over 30 Minutes Intravenous On call to O.R. 12/27/20 1245 12/27/20 1615   12/27/20 2200  piperacillin-tazobactam (ZOSYN) IVPB 3.375 g        3.375 g 12.5 mL/hr over 240 Minutes Intravenous Every 8 hours 12/27/20 1750     12/27/20 1130  piperacillin-tazobactam (ZOSYN) IVPB 4.5 g        4.5 g 200 mL/hr over 30 Minutes Intravenous  Once 12/27/20 1127 12/27/20 1247        Objective: Vitals:   12/27/20 1624 12/27/20 1926 12/28/20 0018 12/28/20 0400  BP: (!) 108/59 124/81 133/87 132/86  Pulse: (!) 102 (!) 103 92 87  Resp: (!) '23 20 20 20  '$ Temp: 100 F (37.8 C) 98.1 F (36.7 C) 98.2 F (36.8 C) 97.6 F (36.4 C)  TempSrc: Oral Oral Oral Oral  SpO2: 97%  96% 96%  Weight:      Height:        Intake/Output Summary (Last 24 hours) at 12/28/2020 1053 Last data filed at 12/28/2020 0407 Gross per 24 hour  Intake 3367.93 ml  Output --  Net 3367.93 ml   Filed Weights   12/27/20 1053  Weight: 85.7 kg    Examination:  General exam: Appears calm and comfortable  Respiratory system: Clear to auscultation. Respiratory effort normal. No respiratory distress. No conversational dyspnea.  Cardiovascular system: S1 & S2 heard, RRR. No murmurs. No pedal edema. Gastrointestinal system: Abdomen is nondistended, soft and mildly TTP RUQ. Normal bowel sounds heard. Central nervous system: Alert and oriented. No focal neurological deficits. Speech clear.  Extremities: Symmetric in appearance  Skin: No rashes, lesions or ulcers on exposed skin  Psychiatry: Judgement and insight appear normal. Mood & affect appropriate.   Data Reviewed: I have personally reviewed following labs and imaging studies  CBC: Recent Labs  Lab 12/27/20 1057 12/28/20 0518  WBC 13.5* 19.5*  NEUTROABS 12.7*  --   HGB 13.9 11.8*  HCT 42.6 37.5*  MCV 98.6 99.5  PLT 248 [id number]    Basic Metabolic Panel: Recent Labs  Lab 12/22/20 0808 12/27/20 1057 12/27/20 1812 12/28/20 0518  NA 143 137  --  137  K 4.4 3.9  --  4.2  CL 108* 106  --  107  CO2 22 22  --  22  GLUCOSE 114* 189*  --  198*  BUN 14 23  --  30*  CREATININE 0.86 1.11 1.29* 1.11  CALCIUM 9.1 8.2*  --  7.8*   GFR: Estimated Creatinine Clearance: 80.4 mL/min (by C-G formula based on SCr of 1.11 mg/dL). Liver Function Tests: Recent Labs  Lab 12/22/20 0808 12/26/20 1826 12/27/20 1057 12/28/20 0518  AST 17 365* 195* 83*  ALT 46* 168* 183* 105*  ALKPHOS 217* 339* 342* 224*  BILITOT 0.2 1.6* 4.5* 3.3*  PROT 6.4 7.0 6.8 5.2*  ALBUMIN 3.6* 3.5 3.2* 2.4*   Recent Labs  Lab 12/27/20 1126  LIPASE 17   No results for input(s): AMMONIA in the last 168 hours. Coagulation Profile: Recent Labs  Lab 12/27/20 1213  INR 1.1   Cardiac Enzymes: No results for input(s): CKTOTAL, CKMB, CKMBINDEX, TROPONINI in the last 168 hours. BNP (last 3 results) No results for input(s): PROBNP in the last 8760 hours. HbA1C: No results for input(s): HGBA1C in the last 72 hours. CBG: Recent Labs  Lab 12/27/20 1314 12/27/20 1503 12/27/20 1732 12/27/20 2147 12/28/20 0749  GLUCAP 164* 133* 114* 137* 209*   Lipid Profile: No results for input(s): CHOL, HDL, LDLCALC, TRIG, CHOLHDL, LDLDIRECT in the last 72 hours. Thyroid Function Tests: No results for input(s): TSH, T4TOTAL, FREET4, T3FREE, THYROIDAB in the last 72 hours. Anemia Panel: No results for input(s): VITAMINB12, FOLATE, FERRITIN, TIBC, IRON, RETICCTPCT in the last 72 hours. Sepsis Labs: Recent Labs  Lab 12/27/20 1057  LATICACIDVEN 2.8*    Recent Results (from the past 240 hour(s))  Culture, blood (Routine X 2) w Reflex to ID Panel     Status: None (Preliminary result)   Collection Time: 12/27/20 11:26 AM   Specimen: BLOOD  Result Value Ref Range Status   Specimen Description   Final    BLOOD RIGHT ANTECUBITAL Performed at [facility], [address], [place], [place] [address]    Special Requests   Final    BOTTLES DRAWN AEROBIC AND ANAEROBIC Blood Culture adequate volume Performed at [facility], [address], [place], [place] [address]    Culture  Setup Time   Final    GRAM NEGATIVE RODS IN BOTH AEROBIC AND ANAEROBIC BOTTLES Gram Stain Report Called to,Read Back By and Verified With: Evans,T'@0015'$  by Zigmund Daniel, b 8.11.22 CRITICAL RESULT CALLED TO, READ BACK BY AND VERIFIED WITH: TINA EVANS RN '@0152'$  12/28/20 EB     Culture   Final    GRAM NEGATIVE RODS CULTURE REINCUBATED FOR BETTER GROWTH ISOLATING Performed at [facility], [place] [address] [place], [place] [address]    Report Status PENDING  Incomplete  Blood Culture ID Panel (Reflexed)     Status: Abnormal   Collection Time: 12/27/20 11:26 AM  Result Value Ref Range Status   Enterococcus faecalis NOT DETECTED NOT DETECTED Final   Enterococcus Faecium NOT DETECTED NOT DETECTED Final   Listeria monocytogenes NOT DETECTED NOT DETECTED Final   Staphylococcus species NOT DETECTED NOT DETECTED Final   Staphylococcus aureus (BCID) NOT DETECTED NOT DETECTED Final   Staphylococcus epidermidis NOT DETECTED NOT DETECTED Final   Staphylococcus lugdunensis NOT DETECTED NOT DETECTED Final   Streptococcus species NOT DETECTED NOT DETECTED Final   Streptococcus agalactiae NOT DETECTED NOT DETECTED Final   Streptococcus pneumoniae NOT DETECTED NOT DETECTED Final   Streptococcus pyogenes NOT DETECTED NOT DETECTED Final   A.calcoaceticus-baumannii NOT DETECTED NOT DETECTED Final   Bacteroides fragilis NOT DETECTED NOT DETECTED Final   Enterobacterales DETECTED (A) NOT DETECTED Final    Comment: CRITICAL RESULT CALLED TO, READ BACK BY AND VERIFIED WITH: TINA EVANS RN '@0152'$  12/28/20 EB     Enterobacter cloacae complex NOT DETECTED NOT DETECTED Final   Escherichia coli DETECTED (A) NOT DETECTED Final    Comment: CRITICAL RESULT CALLED TO, READ BACK BY AND VERIFIED  WITH: TINA EVANS RN '@0152'$  12/28/20 EB     Klebsiella aerogenes NOT  DETECTED NOT DETECTED Final   Klebsiella oxytoca DETECTED (A) NOT DETECTED Final    Comment: CRITICAL RESULT CALLED TO, READ BACK BY AND VERIFIED WITH: TINA EVANS RN '@0152'$  12/28/20 EB     Klebsiella pneumoniae DETECTED (A) NOT DETECTED Final    Comment: CRITICAL RESULT CALLED TO, READ BACK BY AND VERIFIED WITH: TINA EVANS RN '@0152'$  12/28/20 EB     Proteus species NOT DETECTED NOT DETECTED Final   Salmonella species NOT DETECTED NOT DETECTED Final   Serratia marcescens NOT DETECTED NOT DETECTED Final   Haemophilus influenzae NOT DETECTED NOT DETECTED Final   Neisseria meningitidis NOT DETECTED NOT DETECTED Final   Pseudomonas aeruginosa NOT DETECTED NOT DETECTED Final   Stenotrophomonas maltophilia NOT DETECTED NOT DETECTED Final   Candida albicans NOT DETECTED NOT DETECTED Final   Candida auris NOT DETECTED NOT DETECTED Final   Candida glabrata NOT DETECTED NOT DETECTED Final   Candida krusei NOT DETECTED NOT DETECTED Final   Candida parapsilosis NOT DETECTED NOT DETECTED Final   Candida tropicalis NOT DETECTED NOT DETECTED Final   Cryptococcus neoformans/gattii NOT DETECTED NOT DETECTED Final   CTX-M ESBL NOT DETECTED NOT DETECTED Final   Carbapenem resistance IMP NOT DETECTED NOT DETECTED Final   Carbapenem resistance KPC NOT DETECTED NOT DETECTED Final   Carbapenem resistance NDM NOT DETECTED NOT DETECTED Final   Carbapenem resist OXA 48 LIKE NOT DETECTED NOT DETECTED Final   Carbapenem resistance VIM NOT DETECTED NOT DETECTED Final    Comment: Performed at [facility], [address] [place], [place] [address]  Resp Panel by RT-PCR (Flu A&B, Covid) Nasopharyngeal Swab     Status: None   Collection Time: 12/27/20 11:38 AM   Specimen: Nasopharyngeal Swab; Nasopharyngeal(NP) swabs in vial transport medium  Result Value Ref Range Status   SARS Coronavirus 2 by RT PCR NEGATIVE NEGATIVE Final    Comment:  (NOTE) SARS-CoV-2 target nucleic acids are NOT DETECTED.  The SARS-CoV-2 RNA is generally detectable in upper respiratory specimens during the acute phase of infection. The lowest concentration of SARS-CoV-2 viral copies this assay can detect is 138 copies/mL. A negative result does not preclude SARS-Cov-2 infection and should not be used as the sole basis for treatment or other patient management decisions. A negative result may occur with  improper specimen collection/handling, submission of specimen other than nasopharyngeal swab, presence of viral mutation(s) within the areas targeted by this assay, and inadequate number of viral copies(<138 copies/mL). A negative result must be combined with clinical observations, patient history, and epidemiological information. The expected result is Negative.  Fact Sheet for Patients:  EntrepreneurPulse.com.au  Fact Sheet for Healthcare Providers:  IncredibleEmployment.be  This test is no t yet approved or cleared by the [place] FDA and  has been authorized for detection and/or diagnosis of SARS-CoV-2 by FDA under an Emergency Use Authorization (EUA). This EUA will remain  in effect (meaning this test can be used) for the duration of the COVID-19 declaration under Section 564(b)(1) of the Act, 21 U.S.C.section 360bbb-3(b)(1), unless the authorization is terminated  or revoked sooner.       Influenza A by PCR NEGATIVE NEGATIVE Final   Influenza B by PCR NEGATIVE NEGATIVE Final    Comment: (NOTE) The Xpert Xpress SARS-CoV-2/FLU/RSV plus assay is intended as an aid in the diagnosis of influenza from Nasopharyngeal swab specimens and should not be used as a sole basis for treatment. Nasal washings and aspirates are unacceptable for Xpert Xpress SARS-CoV-2/FLU/RSV testing.  Fact Sheet for Patients: EntrepreneurPulse.com.au  Fact Sheet for Healthcare  Providers: IncredibleEmployment.be  This test is not yet approved or cleared by the [place] FDA and has been authorized for detection and/or diagnosis of SARS-CoV-2 by FDA under an Emergency Use Authorization (EUA). This EUA will remain in effect (meaning this test can be used) for the duration of the COVID-19 declaration under Section 564(b)(1) of the Act, 21 U.S.C. section 360bbb-3(b)(1), unless the authorization is terminated or revoked.  Performed at [facility], [address], [place], [place] [address]   Culture, blood (Routine X 2) w Reflex to ID Panel     Status: None (Preliminary result)   Collection Time: 12/27/20 12:13 PM   Specimen: BLOOD  Result Value Ref Range Status   Specimen Description   Final    BLOOD LEFT ANTECUBITAL Performed at [facility], [address] [place], [place] [address]    Special Requests   Final    BOTTLES DRAWN AEROBIC AND ANAEROBIC Blood Culture adequate volume Performed at [facility], [address], [place], [place] [address]    Culture  Setup Time   Final    GRAM NEGATIVE RODS ANAEROBIC AND AEROBIC BOTTLES Gram Stain Report Called to,Read Back By and Verified With: EVANS,T'@0325'$  BY MATTHEWS, B 8.11.22  [facility] Performed at [facility], [address] [place], [place] [address]    Culture GRAM NEGATIVE RODS  Final   Report Status PENDING  Incomplete      Radiology Studies: X-ray abdomen AP  Result Date: 12/27/2020 CLINICAL DATA:  Pancreatic cancer post stent replacement. The original stent could not be located during ERCP. EXAM: ABDOMEN - 1 VIEW COMPARISON:  CT 12/05/2020, PET-CT 12/14/2020 and images from ERCP done earlier today. FINDINGS: New metallic biliary stent in place with waisting in its mid section. The preexistent plastic biliary stent overlaps the left paraspinal region, displaced from its location on recent CT and PET, presumably intraluminal within the proximal small bowel. No supine  evidence of free intraperitoneal air or bowel obstruction. Mild degenerative changes are present within the lumbar spine. IMPRESSION: 1. The plastic biliary stent is displaced from its recent location, overlapping the left paraspinal region, presumably within the proximal small bowel. 2. New biliary stent appears satisfactorily positioned. Electronically Signed   By: Richardean Sale M.D.   On: 12/27/2020 15:41   DG Chest Port 1 View  Result Date: 12/27/2020 CLINICAL DATA:  Fever. EXAM: PORTABLE CHEST 1 VIEW COMPARISON:  March 13, 2007. FINDINGS: The heart size and mediastinal contours are within normal limits. Both lungs are clear. Right internal jugular Port-A-Cath is noted with distal tip in expected position of the SVC. The visualized skeletal structures are unremarkable. IMPRESSION: No active disease. Electronically Signed   By: Marijo Conception M.D.   On: 12/27/2020 12:14   DG ERCP  Result Date: 12/27/2020 CLINICAL DATA:  ERCP with stent replacement.  Pancreatic cancer. EXAM: ERCP TECHNIQUE: Multiple spot images obtained with the fluoroscopic device and submitted for interpretation post-procedure. FLUOROSCOPY TIME:  Fluoroscopy Time:  2 minutes and 16 seconds Radiation Exposure Index (if provided by the fluoroscopic device): 46.4 mGy Number of Acquired Spot Images: 0 COMPARISON:  Abdominal CT 12/05/2020 and PET-CT 12/14/2020. FINDINGS: The submitted images demonstrate moderate dilatation of the common hepatic duct and mild intrahepatic biliary dilatation. There is mild wall irregularity of the common hepatic duct, but no discrete stones are seen. There is high-grade focal narrowing of the common bile duct at the level of  the pancreatic head. Final images demonstrate the placement of a metallic biliary stent across the common bile duct with waisting within the pancreatic head. There is decompression of the biliary system on the final image. The plastic biliary stent is visible on some of the images  overlapping the spine, better seen on the postoperative abdominal radiograph. IMPRESSION: Images during biliary stent exchange for pancreatic cancer as described. The known displaced plastic biliary stent is partially visualized, better seen on postoperative radiographs. These images were submitted for radiologic interpretation only. Please see the procedural report for the amount of contrast and the fluoroscopy time utilized. Electronically Signed   By: Richardean Sale M.D.   On: 12/27/2020 15:49      Scheduled Meds:  Chlorhexidine Gluconate Cloth  6 each Topical Daily   enoxaparin (LOVENOX) injection  40 mg Subcutaneous Q24H   insulin aspart  0-15 Units Subcutaneous TID WC   insulin aspart  0-5 Units Subcutaneous QHS   insulin detemir  15 Units Subcutaneous QHS   levothyroxine  175 mcg Oral QAC breakfast   Continuous Infusions:  piperacillin-tazobactam (ZOSYN)  IV 3.375 g (12/28/20 0600)     LOS: 1 day      Time spent: 30 minutes   Dessa Phi, DO [facility] 12/28/2020, 10:53 AM   Available via Epic secure chat 7am-7pm After these hours, please refer to coverage provider listed on [email or web address]

## 2020-12-28 NOTE — Op Note (Addendum)
[facility] Patient Name: Alexander Banks Procedure Date: 12/27/2020 1:41 PM MRN: [id number] Date of Birth: 04-Aug-1955 Attending MD: Hildred Laser , MD CSN: [id number] Age: 65 Admit Type: Inpatient Procedure:                ERCP Indications:              Suspected ascending cholangitis, Malignant tumor of                            the head of pancreas Providers:                Hildred Laser, MD, Rosina Lowenstein, RN, Nelma Rothman,                            Technician Referring MD:              Medicines:                General Anesthesia Complications:            No immediate complications. Estimated Blood Loss:     Estimated blood loss: none. Procedure:                Pre-Anesthesia Assessment:                           - Prior to the procedure, a History and Physical                            was performed, and patient medications and                            allergies were reviewed. The patient's tolerance of                            previous anesthesia was also reviewed. The risks                            and benefits of the procedure and the sedation                            options and risks were discussed with the patient.                            All questions were answered, and informed consent                            was obtained. Prior Anticoagulants: The patient has                            taken no previous anticoagulant or antiplatelet                            agents. ASA Grade Assessment: III - A patient with                            severe systemic  disease. After reviewing the risks                            and benefits, the patient was deemed in                            satisfactory condition to undergo the procedure.                           After obtaining informed consent, the scope was                            passed under direct vision. Throughout the                            procedure, the patient's blood pressure, pulse, and                             oxygen saturations were monitored continuously. The                            [id number]) scope was introduced through                            the mouth, and used to inject contrast into and                            used to inject contrast into the bile duct. The                            ERCP was accomplished without difficulty. The                            patient tolerated the procedure well. Scope In: 2:18:54 PM Scope Out: 2:36:13 PM Total Procedure Duration: 0 hours 17 minutes 19 seconds  Findings:      The scout film was normal. The esophagus was successfully intubated       under direct vision. The scope was advanced to a normal major papilla in       the descending duodenum without detailed examination of the pharynx,       larynx and associated structures, and upper GI tract. The upper GI tract       was grossly normal. There was no stent seen emerging from the major       papilla. There was no stent seen emerging from the major papilla. The       major papilla was prominent. The bile duct was deeply cannulated with       the Hydratome sphincterotome. Contrast was injected. I personally       interpreted the bile duct images. Ductal flow of contrast was adequate.       Image quality was adequate. Contrast extended to the entire biliary       tree. The middle third of the main bile duct contained a single severe       stenosis. The common hepatic duct was moderately dilated, secondary to  known pancreatic tumor. One 10 mm by 4 cm covered metal stent was placed       3.5 cm into the common bile duct. Bile, clear fluid and pus flowed       through the stent. The stent was in good position. Impression:               - No biliary stent seen.                           - The major papilla appeared to be prominent with                            evidence of prior sphincyrtomy.                           - A single severe biliary stricture was  found in                            the middle third of the main bile duct. The                            stricture was malignant appearing.                           - The common bile duct andhepatic duct was                            moderately dilated updtream.                           - One covered metal stent was placed into the                            common bile duct.                           note: pictures cannot be transferred for this report Moderate Sedation:      Per Anesthesia Care Recommendation:           - Return patient to hospital ward for ongoing care.                           - Clear liquid diet today.                           - Continue present medications.                           - CBC and LFTs in am. Procedure Code(s):        --- Professional ---                           [id number], Endoscopic retrograde                            cholangiopancreatography (ERCP); with placement of  endoscopic stent into biliary or pancreatic duct,                            including pre- and post-dilation and guide wire                            passage, when performed, including sphincterotomy,                            when performed, each stent Diagnosis Code(s):        --- Professional ---                           K83.1, Obstruction of bile duct                           C25.0, Malignant neoplasm of head of pancreas                           K83.8, Other specified diseases of biliary tract CPT copyright 2019 [employer]. All rights reserved. The codes documented in this report are preliminary and upon coder review may  be revised to meet current compliance requirements. Hildred Laser, MD Hildred Laser, MD 12/27/2020 6:06:59 PM This report has been signed electronically. Number of Addenda: 0

## 2020-12-28 NOTE — Progress Notes (Signed)
Date and time results received: 12/28/20 12:26 AM  (use smartphrase ".now" to insert current time)  Test: blood cultures Critical Value: positive gram variable rod  Name of Provider Notified: Dr. Myna Hidalgo  Orders Received? Or Actions Taken?:  awaiting orders/ continue to monitor patient

## 2020-12-28 NOTE — Plan of Care (Signed)

## 2020-12-28 NOTE — Progress Notes (Signed)
Patient tolerated crackers and peanut butter  this morning along with ginger ale. No complaints of pain.

## 2020-12-28 NOTE — Progress Notes (Signed)
Subjective: Doing well today. Occasional twinges of abdominal pain, but nothing significant or persistent.  Denies nausea or vomiting.  Tolerated breakfast well and is currently eating lunch.  He did have an episode of diarrhea today which is new for him.  Denies BRBPR or melena.  Objective: Vital signs in last 24 hours: Temp:  [97.6 F (36.4 C)-100.8 F (38.2 C)] 97.6 F (36.4 C) (08/11 0400) Pulse Rate:  [87-147] 87 (08/11 0400) Resp:  [20-40] 20 (08/11 0400) BP: (108-189)/(59-129) 132/86 (08/11 0400) SpO2:  [91 %-99 %] 96 % (08/11 0400) Last BM Date: 12/26/20 General:   Alert and oriented, pleasant Head:  Normocephalic and atraumatic. Eyes:  Slight scleral icterus.   Abdomen:  Bowel sounds present, soft, non-tender, non-distended. No HSM or hernias noted. No rebound or guarding. No masses appreciated  Msk:  Symmetrical without gross deformities. Normal posture. Extremities:  Without edema. Neurologic:  Alert and  oriented x4;  grossly normal neurologically. Skin:  Warm and dry, intact without significant lesions.  Psych:  Normal mood and affect.  Intake/Output from previous day: 08/10 0701 - 08/11 0700 In: 3367.9 [P.O.:150; I.V.:3167.9; IV Piggyback:50] Out: -  Intake/Output this shift: No intake/output data recorded.  Lab Results: Recent Labs    12/27/20 1057 12/28/20 0518  WBC 13.5* 19.5*  HGB 13.9 11.8*  HCT 42.6 37.5*  PLT 248 177   BMET Recent Labs    12/27/20 1057 12/27/20 1812 12/28/20 0518  NA 137  --  137  K 3.9  --  4.2  CL 106  --  107  CO2 22  --  22  GLUCOSE 189*  --  198*  BUN 23  --  30*  CREATININE 1.11 1.29* 1.11  CALCIUM 8.2*  --  7.8*   LFT Recent Labs    12/26/20 1826 12/27/20 1057 12/28/20 0518  PROT 7.0 6.8 5.2*  ALBUMIN 3.5 3.2* 2.4*  AST 365* 195* 83*  ALT 168* 183* 105*  ALKPHOS 339* 342* 224*  BILITOT 1.6* 4.5* 3.3*  BILIDIR 1.0*  --   --   IBILI 0.6  --   --    PT/INR Recent Labs    12/27/20 1213  LABPROT  14.5  INR 1.1   Studies/Results: X-ray abdomen AP  Result Date: 12/27/2020 CLINICAL DATA:  Pancreatic cancer post stent replacement. The original stent could not be located during ERCP. EXAM: ABDOMEN - 1 VIEW COMPARISON:  CT 12/05/2020, PET-CT 12/14/2020 and images from ERCP done earlier today. FINDINGS: New metallic biliary stent in place with waisting in its mid section. The preexistent plastic biliary stent overlaps the left paraspinal region, displaced from its location on recent CT and PET, presumably intraluminal within the proximal small bowel. No supine evidence of free intraperitoneal air or bowel obstruction. Mild degenerative changes are present within the lumbar spine. IMPRESSION: 1. The plastic biliary stent is displaced from its recent location, overlapping the left paraspinal region, presumably within the proximal small bowel. 2. New biliary stent appears satisfactorily positioned. Electronically Signed   By: Richardean Sale M.D.   On: 12/27/2020 15:41   DG Chest Port 1 View  Result Date: 12/27/2020 CLINICAL DATA:  Fever. EXAM: PORTABLE CHEST 1 VIEW COMPARISON:  March 13, 2007. FINDINGS: The heart size and mediastinal contours are within normal limits. Both lungs are clear. Right internal jugular Port-A-Cath is noted with distal tip in expected position of the SVC. The visualized skeletal structures are unremarkable. IMPRESSION: No active disease. Electronically Signed   By:  Marijo Conception M.D.   On: 12/27/2020 12:14   DG ERCP  Result Date: 12/27/2020 CLINICAL DATA:  ERCP with stent replacement.  Pancreatic cancer. EXAM: ERCP TECHNIQUE: Multiple spot images obtained with the fluoroscopic device and submitted for interpretation post-procedure. FLUOROSCOPY TIME:  Fluoroscopy Time:  2 minutes and 16 seconds Radiation Exposure Index (if provided by the fluoroscopic device): 46.4 mGy Number of Acquired Spot Images: 0 COMPARISON:  Abdominal CT 12/05/2020 and PET-CT 12/14/2020. FINDINGS: The  submitted images demonstrate moderate dilatation of the common hepatic duct and mild intrahepatic biliary dilatation. There is mild wall irregularity of the common hepatic duct, but no discrete stones are seen. There is high-grade focal narrowing of the common bile duct at the level of the pancreatic head. Final images demonstrate the placement of a metallic biliary stent across the common bile duct with waisting within the pancreatic head. There is decompression of the biliary system on the final image. The plastic biliary stent is visible on some of the images overlapping the spine, better seen on the postoperative abdominal radiograph. IMPRESSION: Images during biliary stent exchange for pancreatic cancer as described. The known displaced plastic biliary stent is partially visualized, better seen on postoperative radiographs. These images were submitted for radiologic interpretation only. Please see the procedural report for the amount of contrast and the fluoroscopy time utilized. Electronically Signed   By: Richardean Sale M.D.   On: 12/27/2020 15:49    Assessment: 65 year old male with recent diagnosis of pancreatic cancer felt to be localized with recommendations for preop chemotherapy.  Underwent ERCP 6/23 with biliary sphincterotomy and plastic stent placed across severe biliary stricture in CBD.  He is now admitted with sepsis secondary to cholangitis with bacteremia.  Cholangitis with bacteremia: Secondary to severe biliary stricture. ERCP completed 8/10 with prior biliary stent not seen, severe biliary stricture s/p placement of covered metal stent.  He was started on IV Zosyn. Follow-up abdominal film revealed plastic stent possibly in third and fourth portion of duodenum which is likely not to cause any significant problem for the patient. Anticipate he will pass the stent on his own. Today, LFTs and T bili trending down.  AST 83, ALT 105, alk phos 224, T bili 3.3, down from AST 195, ALT 183, alk  phos 342, T bili 4.5 yesterday.  WBC has increased to 19.5 today from 13.5 yesterday.  Blood cultures positive for E. coli and Klebsiella.  Clinically, patient is feeling improved.  Denies abdominal pain, nausea, or vomiting.  He is tolerating his diet well.  Afebrile today.   Plan: Continue IV antibiotics. Can switch or oral agent as appropriate per hospitlist. Patient will need to complete 7 day course of antibiotics.  Continue to trend LFTs and bilirubin. Repeat abdominal x-ray in 2 week to evaluate for retained stent.  Patient is to let us know if he passes the stent prior to repeat imaging.     LOS: 1 day    12/28/2020, 11:38 AM   Aliene Altes, PA-C [facility]

## 2020-12-29 ENCOUNTER — Encounter (HOSPITAL_COMMUNITY): Payer: Self-pay

## 2020-12-29 ENCOUNTER — Encounter (HOSPITAL_COMMUNITY): Payer: BC Managed Care – PPO

## 2020-12-29 DIAGNOSIS — A419 Sepsis, unspecified organism: Secondary | ICD-10-CM

## 2020-12-29 LAB — CBC
HCT: 33.6 % — ABNORMAL LOW (ref 39.0–52.0)
Hemoglobin: 10.9 g/dL — ABNORMAL LOW (ref 13.0–17.0)
MCH: 31.2 pg (ref 26.0–34.0)
MCHC: 32.4 g/dL (ref 30.0–36.0)
MCV: 96.3 fL (ref 80.0–100.0)
Platelets: 160 10*3/uL (ref 150–400)
RBC: 3.49 MIL/uL — ABNORMAL LOW (ref 4.22–5.81)
RDW: 13.3 % (ref 11.5–15.5)
WBC: 11.6 10*3/uL — ABNORMAL HIGH (ref 4.0–10.5)
nRBC: 0 % (ref 0.0–0.2)

## 2020-12-29 LAB — GLUCOSE, CAPILLARY
Glucose-Capillary: 157 mg/dL — ABNORMAL HIGH (ref 70–99)
Glucose-Capillary: 220 mg/dL — ABNORMAL HIGH (ref 70–99)
Glucose-Capillary: 249 mg/dL — ABNORMAL HIGH (ref 70–99)
Glucose-Capillary: 272 mg/dL — ABNORMAL HIGH (ref 70–99)

## 2020-12-29 LAB — COMPREHENSIVE METABOLIC PANEL
ALT: 71 U/L — ABNORMAL HIGH (ref 0–44)
AST: 39 U/L (ref 15–41)
Albumin: 2.1 g/dL — ABNORMAL LOW (ref 3.5–5.0)
Alkaline Phosphatase: 220 U/L — ABNORMAL HIGH (ref 38–126)
Anion gap: 5 (ref 5–15)
BUN: 24 mg/dL — ABNORMAL HIGH (ref 8–23)
CO2: 25 mmol/L (ref 22–32)
Calcium: 7.8 mg/dL — ABNORMAL LOW (ref 8.9–10.3)
Chloride: 106 mmol/L (ref 98–111)
Creatinine, Ser: 0.95 mg/dL (ref 0.61–1.24)
GFR, Estimated: >60 mL/min (ref >60)
Glucose, Bld: 189 mg/dL — ABNORMAL HIGH (ref 70–99)
Potassium: 3.9 mmol/L (ref 3.5–5.1)
Sodium: 136 mmol/L (ref 135–145)
Total Bilirubin: 1.2 mg/dL (ref 0.3–1.2)
Total Protein: 4.9 g/dL — ABNORMAL LOW (ref 6.5–8.1)

## 2020-12-29 NOTE — Progress Notes (Signed)
PROGRESS NOTE    Alexander Banks  [id number] DOB: 1955-07-09 DOA: 12/27/2020 PCP: [name], MD     Brief Narrative:  Alexander Banks is a 65 y.o. male with medical history of DM2, pancreatic adenocarcinoma presents with 1 day history of fevers, chills, epigastric pain.  The patient was recently admitted to the hospital from 11/06/2020 to 11/10/2020.  He was initially admitted for DKA.  The patient was noted to have elevated LFTs.  Abdominal ultrasound was obtained and showed biliary ductal dilatation.  Further imaging suggested a pancreatic head mass resulting in biliary obstruction.  He underwent ERCP with brushing cytology and placement of a plastic biliary stent on 11/09/2020.  Bile duct cytology was negative.  The patient subsequently followed up with [name] who performed an EUS with FNA on 11/30/2020.  FNA was positive for adenocarcinoma.  He was seen by [name] of oncolgy and also by [name] at [facility] surgery.  Patient's condition was felt to be resectable.  Decision was made for preop chemotherapy for 3 months.  He had Port-A-Cath placed 12/18/20.  The patient was scheduled to start neoadjuvant chemotherapy on 12/27/2020 under the care of [name].  Unfortunately, the patient developed fevers, chills, and epigastric pain on 12/26/2020.  He contacted Dr. [name] whom ordered blood work including LFTs which were noted to be elevated.  There was concern for cholangitis.  The patient had 2 episodes of nausea and vomiting on 12/26/2020 without any blood.  Because of his continued pain and fevers and chills, the patient was instructed to go to the emergency department for further evaluation.  Patient underwent ERCP with stent placement on 8/10.  New events last 24 hours / Subjective: Patient sitting up in the bed awake alert oriented in no acute distress, hemodynamically stable afebrile normotensive...    Assessment & Plan:   Active Problems:   Sepsis due  to undetermined organism ([place])   Cholangitis   Severe sepsis secondary to cholangitis and bacteremia, present on admission -Stable afebrile normotensive -Status post ERCP with stent placement 8/10.  Plain abdominal film revealed plastic stent possibly in the duodenum, was not seen during fluoroscopy for ERCP.  This may have to be removed endoscopically -Blood culture positive for Enterobacter, E. coli, Klebsiella --Pending sensitivity, -Continue IV Zosyn -Called infectious disease on call [name] at [facility] -recommended patient to stay till sensitivity is back otherwise recommended patient to be discharged on Augmentin  Pancreatic adenocarcinoma -Follow-up with [name], first chemotherapy session now delayed due to this hospital stay -Stable  Diabetes mellitus type 2 -Continue Levemir, -Checking CBG q. ACH S, with SSI coverage  Hyperlipidemia -Crestor on hold due to elevated LFTs  Hypothyroidism -Continue Synthroid  DVT prophylaxis:  enoxaparin (LOVENOX) injection 40 mg Start: 12/28/20 1100  Code Status:     Code Status Orders  (From admission, onward)           Start     Ordered   12/27/20 1750  Full code  Continuous        12/27/20 1750           Code Status History     Date Active Date Inactive Code Status Order ID Comments User Context   11/07/2020 0141 11/10/2020 1736 Full Code [id number]  Bernadette Hoit, DO Inpatient      Advance Directive Documentation    Flowsheet Row Most Recent Value  Type of Advance Directive Healthcare Power of [place], Living will  Pre-existing out of facility DNR order (yellow form or pink MOST form) --  "MOST" Form in Place? --      Family Communication: None at bedside Disposition Plan:  Status is: Inpatient  Remains inpatient appropriate because:IV treatments appropriate due to intensity of illness or inability to take PO  Dispo: The patient is from: Home              Anticipated d/c is to: Home in 1  day              Patient currently is not medically stable to d/c.   Difficult to place patient No      Consultants:  GI  Procedures:  ERCP 8/10  Antimicrobials:  Anti-infectives (From admission, onward)    Start     Dose/Rate Route Frequency Ordered Stop   12/28/20 0600  ceFAZolin (ANCEF) IVPB 2g/100 mL premix  Status:  Discontinued        2 g 200 mL/hr over 30 Minutes Intravenous On call to O.R. 12/27/20 1245 12/27/20 1615   12/27/20 2200  piperacillin-tazobactam (ZOSYN) IVPB 3.375 g        3.375 g 12.5 mL/hr over 240 Minutes Intravenous Every 8 hours 12/27/20 1750     12/27/20 1130  piperacillin-tazobactam (ZOSYN) IVPB 4.5 g        4.5 g 200 mL/hr over 30 Minutes Intravenous  Once 12/27/20 1127 12/27/20 1247        Objective: Vitals:   12/28/20 0400 12/28/20 2041 12/29/20 0523 12/29/20 1307  BP: 132/86 (!) 144/84 (!) 160/94 (!) 169/99  Pulse: 87 82 84 79  Resp: '20 20 20 17  '$ Temp: 97.6 F (36.4 C) 98.9 F (37.2 C) 98.2 F (36.8 C) 98.4 F (36.9 C)  TempSrc: Oral Oral Oral Oral  SpO2: 96% 98% 96% 97%  Weight:      Height:        Intake/Output Summary (Last 24 hours) at 12/29/2020 1322 Last data filed at 12/29/2020 0900 Gross per 24 hour  Intake 240 ml  Output --  Net 240 ml   Filed Weights   12/27/20 1053  Weight: 85.7 kg      Physical Exam:   General:  Alert, oriented, cooperative, no distress;   HEENT:  Normocephalic, PERRL, otherwise with in Normal limits   Neuro:  CNII-XII intact. , normal motor and sensation, reflexes intact   Lungs:   Clear to auscultation BL, Respirations unlabored, no wheezes / crackles  Cardio:    S1/S2, RRR, No murmure, No Rubs or Gallops   Abdomen:   Soft, non-tender, bowel sounds active all four quadrants,  no guarding or peritoneal signs.  Muscular skeletal:  Limited exam - in bed, able to move all 4 extremities, Normal strength,  2+ pulses,  symmetric, No pitting edema  Skin:  Dry, warm to touch, negative for any  Rashes,  Wounds: Please see nursing documentation           Data Reviewed: I have personally reviewed following labs and imaging studies  CBC: Recent Labs  Lab 12/27/20 1057 12/28/20 0518 12/29/20 0413  WBC 13.5* 19.5* 11.6*  NEUTROABS 12.7*  --   --   HGB 13.9 11.8* 10.9*  HCT 42.6 37.5* 33.6*  MCV 98.6 99.5 96.3  PLT 248 177 0000000   Basic Metabolic Panel: Recent Labs  Lab 12/27/20 1057 12/27/20 1812 12/28/20 0518 12/29/20 0413  NA 137  --  137 136  K 3.9  --  4.2 3.9  CL 106  --  107 106  CO2 22  --  22 25  GLUCOSE 189*  --  198* 189*  BUN 23  --  30* 24*  CREATININE 1.11 1.29* 1.11 0.95  CALCIUM 8.2*  --  7.8* 7.8*   GFR: Estimated Creatinine Clearance: 93.9 mL/min (by C-G formula based on SCr of 0.95 mg/dL). Liver Function Tests: Recent Labs  Lab 12/26/20 1826 12/27/20 1057 12/28/20 0518 12/29/20 0413  AST 365* 195* 83* 39  ALT 168* 183* 105* 71*  ALKPHOS 339* 342* 224* 220*  BILITOT 1.6* 4.5* 3.3* 1.2  PROT 7.0 6.8 5.2* 4.9*  ALBUMIN 3.5 3.2* 2.4* 2.1*   Recent Labs  Lab 12/27/20 1126  LIPASE 17   No results for input(s): AMMONIA in the last 168 hours. Coagulation Profile: Recent Labs  Lab 12/27/20 1213  INR 1.1   Cardiac Enzymes: No results for input(s): CKTOTAL, CKMB, CKMBINDEX, TROPONINI in the last 168 hours. BNP (last 3 results) No results for input(s): PROBNP in the last 8760 hours. HbA1C: No results for input(s): HGBA1C in the last 72 hours. CBG: Recent Labs  Lab 12/28/20 1147 12/28/20 1624 12/28/20 2038 12/29/20 0725 12/29/20 1111  GLUCAP 236* 263* 289* 157* 220*   Lipid Profile: No results for input(s): CHOL, HDL, LDLCALC, TRIG, CHOLHDL, LDLDIRECT in the last 72 hours. Thyroid Function Tests: No results for input(s): TSH, T4TOTAL, FREET4, T3FREE, THYROIDAB in the last 72 hours. Anemia Panel: No results for input(s): VITAMINB12, FOLATE, FERRITIN, TIBC, IRON, RETICCTPCT in the last 72 hours. Sepsis Labs: Recent Labs   Lab 12/27/20 1057  LATICACIDVEN 2.8*    Recent Results (from the past 240 hour(s))  Culture, blood (Routine X 2) w Reflex to ID Panel     Status: Abnormal (Preliminary result)   Collection Time: 12/27/20 11:26 AM   Specimen: BLOOD  Result Value Ref Range Status   Specimen Description   Final    BLOOD RIGHT ANTECUBITAL Performed at [facility], [address], [place], [place] [address]    Special Requests   Final    BOTTLES DRAWN AEROBIC AND ANAEROBIC Blood Culture adequate volume Performed at [facility], [address] [place], [place] [address]    Culture  Setup Time   Final    GRAM NEGATIVE RODS IN BOTH AEROBIC AND ANAEROBIC BOTTLES Gram Stain Report Called to,Read Back By and Verified With: Evans,T'@0015'$  by Zigmund Daniel, b 8.11.22 CRITICAL RESULT CALLED TO, READ BACK BY AND VERIFIED WITH: TINA EVANS RN '@0152'$  12/28/20 EB     Culture (A)  Final    ESCHERICHIA COLI KLEBSIELLA PNEUMONIAE ISOLATING SUSCEPTIBILITIES TO FOLLOW Performed at [facility], [place] [address] [place], [place] [address]    Report Status PENDING  Incomplete  Blood Culture ID Panel (Reflexed)     Status: Abnormal   Collection Time: 12/27/20 11:26 AM  Result Value Ref Range Status   Enterococcus faecalis NOT DETECTED NOT DETECTED Final   Enterococcus Faecium NOT DETECTED NOT DETECTED Final   Listeria monocytogenes NOT DETECTED NOT DETECTED Final   Staphylococcus species NOT DETECTED NOT DETECTED Final   Staphylococcus aureus (BCID) NOT DETECTED NOT DETECTED Final   Staphylococcus epidermidis NOT DETECTED NOT DETECTED Final   Staphylococcus lugdunensis NOT DETECTED NOT DETECTED Final   Streptococcus species NOT DETECTED NOT DETECTED Final   Streptococcus agalactiae NOT DETECTED NOT DETECTED Final   Streptococcus pneumoniae NOT DETECTED NOT DETECTED Final   Streptococcus pyogenes NOT DETECTED NOT DETECTED Final   A.calcoaceticus-baumannii NOT DETECTED  NOT DETECTED Final   Bacteroides fragilis NOT  DETECTED NOT DETECTED Final   Enterobacterales DETECTED (A) NOT DETECTED Final    Comment: CRITICAL RESULT CALLED TO, READ BACK BY AND VERIFIED WITH: TINA EVANS RN '@0152'$  12/28/20 EB     Enterobacter cloacae complex NOT DETECTED NOT DETECTED Final   Escherichia coli DETECTED (A) NOT DETECTED Final    Comment: CRITICAL RESULT CALLED TO, READ BACK BY AND VERIFIED WITH: TINA EVANS RN '@0152'$  12/28/20 EB     Klebsiella aerogenes NOT DETECTED NOT DETECTED Final   Klebsiella oxytoca DETECTED (A) NOT DETECTED Final    Comment: CRITICAL RESULT CALLED TO, READ BACK BY AND VERIFIED WITH: TINA EVANS RN '@0152'$  12/28/20 EB     Klebsiella pneumoniae DETECTED (A) NOT DETECTED Final    Comment: CRITICAL RESULT CALLED TO, READ BACK BY AND VERIFIED WITH: TINA EVANS RN '@0152'$  12/28/20 EB     Proteus species NOT DETECTED NOT DETECTED Final   Salmonella species NOT DETECTED NOT DETECTED Final   Serratia marcescens NOT DETECTED NOT DETECTED Final   Haemophilus influenzae NOT DETECTED NOT DETECTED Final   Neisseria meningitidis NOT DETECTED NOT DETECTED Final   Pseudomonas aeruginosa NOT DETECTED NOT DETECTED Final   Stenotrophomonas maltophilia NOT DETECTED NOT DETECTED Final   Candida albicans NOT DETECTED NOT DETECTED Final   Candida auris NOT DETECTED NOT DETECTED Final   Candida glabrata NOT DETECTED NOT DETECTED Final   Candida krusei NOT DETECTED NOT DETECTED Final   Candida parapsilosis NOT DETECTED NOT DETECTED Final   Candida tropicalis NOT DETECTED NOT DETECTED Final   Cryptococcus neoformans/gattii NOT DETECTED NOT DETECTED Final   CTX-M ESBL NOT DETECTED NOT DETECTED Final   Carbapenem resistance IMP NOT DETECTED NOT DETECTED Final   Carbapenem resistance KPC NOT DETECTED NOT DETECTED Final   Carbapenem resistance NDM NOT DETECTED NOT DETECTED Final   Carbapenem resist OXA 48 LIKE NOT DETECTED NOT DETECTED Final   Carbapenem resistance VIM NOT DETECTED NOT DETECTED Final    Comment: Performed at  [facility], [address], [place], [place] [address]  Resp Panel by RT-PCR (Flu A&B, Covid) Nasopharyngeal Swab     Status: None   Collection Time: 12/27/20 11:38 AM   Specimen: Nasopharyngeal Swab; Nasopharyngeal(NP) swabs in vial transport medium  Result Value Ref Range Status   SARS Coronavirus 2 by RT PCR NEGATIVE NEGATIVE Final    Comment: (NOTE) SARS-CoV-2 target nucleic acids are NOT DETECTED.  The SARS-CoV-2 RNA is generally detectable in upper respiratory specimens during the acute phase of infection. The lowest concentration of SARS-CoV-2 viral copies this assay can detect is 138 copies/mL. A negative result does not preclude SARS-Cov-2 infection and should not be used as the sole basis for treatment or other patient management decisions. A negative result may occur with  improper specimen collection/handling, submission of specimen other than nasopharyngeal swab, presence of viral mutation(s) within the areas targeted by this assay, and inadequate number of viral copies(<138 copies/mL). A negative result must be combined with clinical observations, patient history, and epidemiological information. The expected result is Negative.  Fact Sheet for Patients:  EntrepreneurPulse.com.au  Fact Sheet for Healthcare Providers:  IncredibleEmployment.be  This test is no t yet approved or cleared by the [place] FDA and  has been authorized for detection and/or diagnosis of SARS-CoV-2 by FDA under an Emergency Use Authorization (EUA). This EUA will remain  in effect (meaning this test can be used) for the duration of the COVID-19  declaration under Section 564(b)(1) of the Act, 21 U.S.C.section 360bbb-3(b)(1), unless the authorization is terminated  or revoked sooner.       Influenza A by PCR NEGATIVE NEGATIVE Final   Influenza B by PCR NEGATIVE NEGATIVE Final    Comment: (NOTE) The Xpert Xpress SARS-CoV-2/FLU/RSV plus assay is  intended as an aid in the diagnosis of influenza from Nasopharyngeal swab specimens and should not be used as a sole basis for treatment. Nasal washings and aspirates are unacceptable for Xpert Xpress SARS-CoV-2/FLU/RSV testing.  Fact Sheet for Patients: EntrepreneurPulse.com.au  Fact Sheet for Healthcare Providers: IncredibleEmployment.be  This test is not yet approved or cleared by the [place] FDA and has been authorized for detection and/or diagnosis of SARS-CoV-2 by FDA under an Emergency Use Authorization (EUA). This EUA will remain in effect (meaning this test can be used) for the duration of the COVID-19 declaration under Section 564(b)(1) of the Act, 21 U.S.C. section 360bbb-3(b)(1), unless the authorization is terminated or revoked.  Performed at [facility], [address] [place], [place] [address]   Urine Culture     Status: Abnormal   Collection Time: 12/27/20 11:43 AM   Specimen: In/Out Cath Urine  Result Value Ref Range Status   Specimen Description   Final    IN/OUT CATH URINE Performed at [facility], [address], [place], [place] [address]    Special Requests   Final    NONE Performed at [facility], [address] [place], [place] [address]    Culture 4,000 COLONIES/mL STAPHYLOCOCCUS CAPITIS (A)  Final   Report Status 12/28/2020 FINAL  Final  Culture, blood (Routine X 2) w Reflex to ID Panel     Status: Abnormal (Preliminary result)   Collection Time: 12/27/20 12:13 PM   Specimen: BLOOD  Result Value Ref Range Status   Specimen Description   Final    BLOOD LEFT ANTECUBITAL Performed at [facility], [address], [place], [place] [address]    Special Requests   Final    BOTTLES DRAWN AEROBIC AND ANAEROBIC Blood Culture adequate volume Performed at [facility], [address] [place], [place] [address]    Culture  Setup Time   Final    GRAM NEGATIVE RODS ANAEROBIC AND AEROBIC BOTTLES Gram Stain Report  Called to,Read Back By and Verified With: EVANS,T'@0325'$  BY MATTHEWS, B 8.11.22  [facility] Performed at [facility], [address], [place], [name] [address]    Culture KLEBSIELLA PNEUMONIAE (A)  Final   Report Status PENDING  Incomplete      Radiology Studies: X-ray abdomen AP  Result Date: 12/27/2020 CLINICAL DATA:  Pancreatic cancer post stent replacement. The original stent could not be located during ERCP. EXAM: ABDOMEN - 1 VIEW COMPARISON:  CT 12/05/2020, PET-CT 12/14/2020 and images from ERCP done earlier today. FINDINGS: New metallic biliary stent in place with waisting in its mid section. The preexistent plastic biliary stent overlaps the left paraspinal region, displaced from its location on recent CT and PET, presumably intraluminal within the proximal small bowel. No supine evidence of free intraperitoneal air or bowel obstruction. Mild degenerative changes are present within the lumbar spine. IMPRESSION: 1. The plastic biliary stent is displaced from its recent location, overlapping the left paraspinal region, presumably within the proximal small bowel. 2. New biliary stent appears satisfactorily positioned. Electronically Signed   By: Richardean Sale M.D.   On: 12/27/2020 15:41   DG ERCP  Result Date: 12/27/2020 CLINICAL DATA:  ERCP with stent  replacement.  Pancreatic cancer. EXAM: ERCP TECHNIQUE: Multiple spot images obtained with the fluoroscopic device and submitted for interpretation post-procedure. FLUOROSCOPY TIME:  Fluoroscopy Time:  2 minutes and 16 seconds Radiation Exposure Index (if provided by the fluoroscopic device): 46.4 mGy Number of Acquired Spot Images: 0 COMPARISON:  Abdominal CT 12/05/2020 and PET-CT 12/14/2020. FINDINGS: The submitted images demonstrate moderate dilatation of the common hepatic duct and mild intrahepatic biliary dilatation. There is mild wall irregularity of the common hepatic duct, but no discrete stones are seen. There is high-grade focal  narrowing of the common bile duct at the level of the pancreatic head. Final images demonstrate the placement of a metallic biliary stent across the common bile duct with waisting within the pancreatic head. There is decompression of the biliary system on the final image. The plastic biliary stent is visible on some of the images overlapping the spine, better seen on the postoperative abdominal radiograph. IMPRESSION: Images during biliary stent exchange for pancreatic cancer as described. The known displaced plastic biliary stent is partially visualized, better seen on postoperative radiographs. These images were submitted for radiologic interpretation only. Please see the procedural report for the amount of contrast and the fluoroscopy time utilized. Electronically Signed   By: Richardean Sale M.D.   On: 12/27/2020 15:49      Scheduled Meds:  enoxaparin (LOVENOX) injection  40 mg Subcutaneous Q24H   insulin aspart  0-15 Units Subcutaneous TID WC   insulin aspart  0-5 Units Subcutaneous QHS   insulin detemir  15 Units Subcutaneous QHS   levothyroxine  175 mcg Oral QAC breakfast   Continuous Infusions:  piperacillin-tazobactam (ZOSYN)  IV 3.375 g (12/29/20 0539)     LOS: 2 days      Time spent: 30 minutes   Tamar Lipscomb A Orlondo Holycross, DO [facility] 12/29/2020, 1:22 PM   Available via Epic secure chat 7am-7pm After these hours, please refer to coverage provider listed on [email or web address]

## 2020-12-29 NOTE — Progress Notes (Signed)
Subjective: Abdominal pain much improved. Feels like something is "there" but not pain. Feels bloated. No N/V. Afebrile. Tolerating diet.   Objective: Vital signs in last 24 hours: Temp:  [98.2 F (36.8 C)-98.9 F (37.2 C)] 98.2 F (36.8 C) (08/12 0523) Pulse Rate:  [82-84] 84 (08/12 0523) Resp:  [20] 20 (08/12 0523) BP: (144-160)/(84-94) 160/94 (08/12 0523) SpO2:  [96 %-98 %] 96 % (08/12 0523) Last BM Date: 12/29/20 General:   Alert and oriented, pleasant Head:  Normocephalic and atraumatic. Abdomen:  Bowel sounds present, soft, non-tender, non-distended. No HSM or hernias noted. No rebound or guarding. No masses appreciated  Neurologic:  Alert and  oriented x4 Psych:  Alert and cooperative. Normal mood and affect.  Intake/Output from previous day: No intake/output data recorded. Intake/Output this shift: Total I/O In: 240 [P.O.:240] Out: -   Lab Results: Recent Labs    12/27/20 1057 12/28/20 0518 12/29/20 0413  WBC 13.5* 19.5* 11.6*  HGB 13.9 11.8* 10.9*  HCT 42.6 37.5* 33.6*  PLT 248 177 160   BMET Recent Labs    12/27/20 1057 12/27/20 1812 12/28/20 0518 12/29/20 0413  NA 137  --  137 136  K 3.9  --  4.2 3.9  CL 106  --  107 106  CO2 22  --  22 25  GLUCOSE 189*  --  198* 189*  BUN 23  --  30* 24*  CREATININE 1.11 1.29* 1.11 0.95  CALCIUM 8.2*  --  7.8* 7.8*   LFT Recent Labs    12/26/20 1826 12/27/20 1057 12/28/20 0518 12/29/20 0413  PROT 7.0 6.8 5.2* 4.9*  ALBUMIN 3.5 3.2* 2.4* 2.1*  AST 365* 195* 83* 39  ALT 168* 183* 105* 71*  ALKPHOS 339* 342* 224* 220*  BILITOT 1.6* 4.5* 3.3* 1.2  BILIDIR 1.0*  --   --   --   IBILI 0.6  --   --   --    PT/INR Recent Labs    12/27/20 1213  LABPROT 14.5  INR 1.1    Studies/Results: X-ray abdomen AP  Result Date: 12/27/2020 CLINICAL DATA:  Pancreatic cancer post stent replacement. The original stent could not be located during ERCP. EXAM: ABDOMEN - 1 VIEW COMPARISON:  CT 12/05/2020, PET-CT  12/14/2020 and images from ERCP done earlier today. FINDINGS: New metallic biliary stent in place with waisting in its mid section. The preexistent plastic biliary stent overlaps the left paraspinal region, displaced from its location on recent CT and PET, presumably intraluminal within the proximal small bowel. No supine evidence of free intraperitoneal air or bowel obstruction. Mild degenerative changes are present within the lumbar spine. IMPRESSION: 1. The plastic biliary stent is displaced from its recent location, overlapping the left paraspinal region, presumably within the proximal small bowel. 2. New biliary stent appears satisfactorily positioned. Electronically Signed   By: Richardean Sale M.D.   On: 12/27/2020 15:41   DG Chest Port 1 View  Result Date: 12/27/2020 CLINICAL DATA:  Fever. EXAM: PORTABLE CHEST 1 VIEW COMPARISON:  March 13, 2007. FINDINGS: The heart size and mediastinal contours are within normal limits. Both lungs are clear. Right internal jugular Port-A-Cath is noted with distal tip in expected position of the SVC. The visualized skeletal structures are unremarkable. IMPRESSION: No active disease. Electronically Signed   By: Marijo Conception M.D.   On: 12/27/2020 12:14   DG ERCP  Result Date: 12/27/2020 CLINICAL DATA:  ERCP with stent replacement.  Pancreatic cancer. EXAM: ERCP TECHNIQUE:  Multiple spot images obtained with the fluoroscopic device and submitted for interpretation post-procedure. FLUOROSCOPY TIME:  Fluoroscopy Time:  2 minutes and 16 seconds Radiation Exposure Index (if provided by the fluoroscopic device): 46.4 mGy Number of Acquired Spot Images: 0 COMPARISON:  Abdominal CT 12/05/2020 and PET-CT 12/14/2020. FINDINGS: The submitted images demonstrate moderate dilatation of the common hepatic duct and mild intrahepatic biliary dilatation. There is mild wall irregularity of the common hepatic duct, but no discrete stones are seen. There is high-grade focal narrowing of  the common bile duct at the level of the pancreatic head. Final images demonstrate the placement of a metallic biliary stent across the common bile duct with waisting within the pancreatic head. There is decompression of the biliary system on the final image. The plastic biliary stent is visible on some of the images overlapping the spine, better seen on the postoperative abdominal radiograph. IMPRESSION: Images during biliary stent exchange for pancreatic cancer as described. The known displaced plastic biliary stent is partially visualized, better seen on postoperative radiographs. These images were submitted for radiologic interpretation only. Please see the procedural report for the amount of contrast and the fluoroscopy time utilized. Electronically Signed   By: Richardean Sale M.D.   On: 12/27/2020 15:49    Assessment/PLAN: 65 year old male with recent diagnosis of pancreatic cancer felt to be localized with recommendations for preop chemotherapy.  Underwent ERCP 6/23 with biliary sphincterotomy and plastic stent placed across severe biliary stricture in CBD, presenting this admission with cholangitis and bacteremia in setting of biliary stricture.   Underwent ERCP 8/10 with prior biliary stent not seen, severe biliary stricture s/p placement of metal stent. Abdominal film with plastic stent in distal duodenum and with hopes this will pass spontaneously. LFTs continue to improve with AST now normalized, ALT 71 down from 183, alk phos trending down (220 from admission 340 range), and bilirubin now normalized.  From a GI standpoint, he is doing well. Leukocytosis much improved, with WBC count 11.6 down from 19.5 yesterday. Tolerating diet, and pain nearly resolved. Remains afebrile.   May change from IV to oral antibiotics to complete 7 day course once felt appropriate per hospitalist. Will arrange outpatient follow-up with Dr. [name] and plan on outpatient abdominal x-ray in 2 weeks to evaluate  retained stent.   Annitta Needs, PhD, ANP-BC [facility]       LOS: 2 days    12/29/2020, 10:09 AM

## 2020-12-30 DIAGNOSIS — Z9889 Other specified postprocedural states: Secondary | ICD-10-CM

## 2020-12-30 LAB — CULTURE, BLOOD (ROUTINE X 2)
Special Requests: ADEQUATE
Special Requests: ADEQUATE

## 2020-12-30 LAB — COMPREHENSIVE METABOLIC PANEL
ALT: 54 U/L — ABNORMAL HIGH (ref 0–44)
AST: 21 U/L (ref 15–41)
Albumin: 2.3 g/dL — ABNORMAL LOW (ref 3.5–5.0)
Alkaline Phosphatase: 236 U/L — ABNORMAL HIGH (ref 38–126)
Anion gap: 5 (ref 5–15)
BUN: 15 mg/dL (ref 8–23)
CO2: 29 mmol/L (ref 22–32)
Calcium: 8.4 mg/dL — ABNORMAL LOW (ref 8.9–10.3)
Chloride: 102 mmol/L (ref 98–111)
Creatinine, Ser: 0.88 mg/dL (ref 0.61–1.24)
GFR, Estimated: >60 mL/min (ref >60)
Glucose, Bld: 216 mg/dL — ABNORMAL HIGH (ref 70–99)
Potassium: 4.5 mmol/L (ref 3.5–5.1)
Sodium: 136 mmol/L (ref 135–145)
Total Bilirubin: 1.2 mg/dL (ref 0.3–1.2)
Total Protein: 5.6 g/dL — ABNORMAL LOW (ref 6.5–8.1)

## 2020-12-30 LAB — GLUCOSE, CAPILLARY
Glucose-Capillary: 188 mg/dL — ABNORMAL HIGH (ref 70–99)
Glucose-Capillary: 269 mg/dL — ABNORMAL HIGH (ref 70–99)

## 2020-12-30 MED ORDER — AMOXICILLIN-POT CLAVULANATE 875-125 MG PO TABS: 1.0000 | ORAL_TABLET | Freq: Two times a day (BID) | ORAL | 0 refills | Status: DC

## 2020-12-30 NOTE — Progress Notes (Signed)
Subjective: Patient doing well.  No abdominal pain.  No nausea or vomiting.  Tolerating diet though hospital food has been difficult.  Objective: Vital signs in last 24 hours: Temp:  [97.9 F (36.6 C)-98.4 F (36.9 C)] 97.9 F (36.6 C) (08/13 0546) Pulse Rate:  [73-79] 73 (08/13 0546) Resp:  [16-20] 16 (08/13 0546) BP: (155-169)/(93-99) 155/95 (08/13 0546) SpO2:  [97 %-98 %] 97 % (08/13 0546) Last BM Date: 12/29/20 General:   Alert and oriented, pleasant Head:  Normocephalic and atraumatic. Eyes:  No icterus, sclera clear. Conjuctiva pink.  Mouth:  Without lesions, mucosa pink and moist.  Abdomen:  Bowel sounds present, soft, non-tender, non-distended. No HSM or hernias noted. No rebound or guarding. No masses appreciated  Msk:  Symmetrical without gross deformities. Normal posture. Extremities:  Without clubbing or edema. Neurologic:  Alert and  oriented x4;  grossly normal neurologically. Skin:  Warm and dry, intact without significant lesions.  Cervical Nodes:  No significant cervical adenopathy. Psych:  Alert and cooperative. Normal mood and affect.  Intake/Output from previous day: 08/12 0701 - 08/13 0700 In: 720 [P.O.:720] Out: -  Intake/Output this shift: Total I/O In: 240 [P.O.:240] Out: -   Lab Results: Recent Labs    12/28/20 0518 12/29/20 0413  WBC 19.5* 11.6*  HGB 11.8* 10.9*  HCT 37.5* 33.6*  PLT 177 160   BMET Recent Labs    12/28/20 0518 12/29/20 0413 12/30/20 0537  NA 137 136 136  K 4.2 3.9 4.5  CL 107 106 102  CO2 '22 25 29  '$ GLUCOSE 198* 189* 216*  BUN 30* 24* 15  CREATININE 1.11 0.95 0.88  CALCIUM 7.8* 7.8* 8.4*   LFT Recent Labs    12/28/20 0518 12/29/20 0413 12/30/20 0537  PROT 5.2* 4.9* 5.6*  ALBUMIN 2.4* 2.1* 2.3*  AST 83* 39 21  ALT 105* 71* 54*  ALKPHOS 224* 220* 236*  BILITOT 3.3* 1.2 1.2   PT/INR Recent Labs    12/27/20 1213  LABPROT 14.5  INR 1.1   Hepatitis Panel No results for input(s): HEPBSAG, HCVAB,  HEPAIGM, HEPBIGM in the last 72 hours.   Studies/Results: No results found.  Assessment: *Pancreatic adenocarcinoma *Ascending cholangitis with bacteremia due to above status post ERCP with stent placement *Sepsis  Plan: Patient doing well today.  Still awaiting sensitivities of his cultures.  Continue on IV antibiotics for now.  Okay to discharge from GI standpoint once p.o. antibiotics have been established.    He will need repeat KUB in 2 weeks to evaluate retained biliary stent from previous ERCP.  Patient told to call Dr. Olevia Perches office if this passes in his stool we will cancel.    We will arrange follow-up with Dr. [name] in the outpatient setting.  GI to sign off, please call with any questions or concerns.  Elon Alas. Abbey Chatters, D.O. Gastroenterology and Hepatology [facility]   LOS: 3 days    12/30/2020, 11:32 AM

## 2020-12-30 NOTE — Discharge Summary (Signed)
Physician Discharge Summary  Alexander Banks [id number] DOB: [birth year] DOA: 12/27/2020  PCP: [name], MD  Admit date: 12/27/2020 Discharge date: 12/30/2020  Admitted From: Home Disposition:  Home  Recommendations for Outpatient Follow-up:  Follow up with PCP in 1-2 weeks Follow up with GI as scheduled  Discharge Condition:Stable CODE STATUS:Full Diet recommendation: Regular   Brief/Interim Summary: 65 y.o. male with medical history of DM2, pancreatic adenocarcinoma presents with 1 day history of fevers, chills, epigastric pain.  The patient was recently admitted to the hospital from 11/06/2020 to 11/10/2020.  He was initially admitted for DKA.  The patient was noted to have elevated LFTs.  Abdominal ultrasound was obtained and showed biliary ductal dilatation.  Further imaging suggested a pancreatic head mass resulting in biliary obstruction.  He underwent ERCP with brushing cytology and placement of a plastic biliary stent on 11/09/2020.  Bile duct cytology was negative.  The patient subsequently followed up with [name] who performed an EUS with FNA on 11/30/2020.  FNA was positive for adenocarcinoma.  He was seen by [name] of oncolgy and also by [name] at [facility] surgery.  Patient's condition was felt to be resectable.  Decision was made for preop chemotherapy for 3 months.  He had Port-A-Cath placed 12/18/20.  The patient was scheduled to start neoadjuvant chemotherapy on 12/27/2020 under the care of [name].  Unfortunately, the patient developed fevers, chills, and epigastric pain on 12/26/2020.  He contacted Dr. [name] whom ordered blood work including LFTs which were noted to be elevated.  There was concern for cholangitis.  The patient had 2 episodes of nausea and vomiting on 12/26/2020 without any blood.  Because of his continued pain and fevers and chills, the patient was instructed to go to the emergency department for further evaluation.  Patient  underwent ERCP with stent placement on 8/10.  Discharge Diagnoses:  Active Problems:   Sepsis due to undetermined organism ([place])   Cholangitis  Severe sepsis secondary to cholangitis and bacteremia, present on admission -Stable afebrile normotensive -Status post ERCP with stent placement 8/10.  Plain abdominal film revealed plastic stent possibly in the duodenum, was not seen during fluoroscopy for ERCP.  This may have to be removed endoscopically -Blood culture positive for Enterobacter, E. coli, Klebsiella -Continued IV Zosyn while in hospital -[name] discussed with infectious disease on call [name] at [place] to d/c on augmentin to complete course of tx per culture results   Pancreatic adenocarcinoma -Follow-up with [name], first chemotherapy session now delayed due to this hospital stay   Diabetes mellitus type 2 -Continue Levemir, -Checking CBG q. ACH S, with SSI coverage while in hospital  Hyperlipidemia -Crestor on hold due to elevated LFTs  Hypothyroidism -Continue Synthroid as tolerated   Discharge Instructions   Allergies as of 12/30/2020       Reactions   Lisinopril Cough        Medication List     STOP taking these medications    ibuprofen 200 MG tablet Commonly known as: ADVIL   prochlorperazine 10 MG tablet Commonly known as: COMPAZINE   rosuvastatin 40 MG tablet Commonly known as: CRESTOR   sildenafil 100 MG tablet Commonly known as: Viagra       TAKE these medications    amoxicillin-clavulanate 875-125 MG tablet Commonly known as: Augmentin Take 1 tablet by mouth 2 (two) times daily for 5 days. Start taking on: December 31, 2020   dronabinol  5 MG capsule Commonly known as: MARINOL Take 1 capsule (5 mg total) by mouth 2 (two) times daily before a meal.   FreeStyle Libre 2 Reader [name] As directed   [employer] 2 Sensor Misc 1 Piece by Does not apply route every 14 (fourteen) days.   insulin detemir  100 UNIT/ML FlexPen Commonly known as: LEVEMIR Inject 40 Units into the skin at bedtime. What changed: how much to take   insulin lispro 100 UNIT/ML KwikPen Commonly known as: HumaLOG KwikPen Inject 14-20 Units into the skin 3 (three) times daily before meals. What changed: how much to take   IRON-VITAMIN C PO Take 1 tablet by mouth daily.   levothyroxine 175 MCG tablet Commonly known as: SYNTHROID Take 1 tablet (175 mcg total) by mouth daily before breakfast.   lidocaine-prilocaine cream Commonly known as: EMLA Apply a small amount to port a cath site (do not rub in) and cover with plastic wrap 1 hour prior to infusion appointments   MELATIN PO Take by mouth. Takes 2 qhs   ondansetron 8 MG tablet Commonly known as: Zofran Take 1 tablet (8 mg total) by mouth 2 (two) times daily as needed. Start on day 3 after chemotherapy.   OneTouch Delica Lancets [id number] Misc TEST ONE TIME DAILY   OSTEO BI-FLEX REGULAR STRENGTH PO Take 2 tablets by mouth daily.   oxyCODONE-acetaminophen 5-325 MG tablet Commonly known as: PERCOCET/ROXICET Take 1 tablet by mouth every 6 (six) hours as needed for severe pain.        Follow-up Information     Luking, Elayne Snare, MD. Schedule an appointment as soon as possible for a visit in 1 week(s).   Specialty: Family Medicine Why: Hospital follow up Contact information: [address] [place] [address] [phone]         Rogene Houston, MD Follow up.   Specialty: Gastroenterology Why: As scheduled Contact information: [place], [address] [place] [address] [phone]                Allergies  Allergen Reactions   Lisinopril Cough    Consultations: GI ID over phone  Procedures/Studies: CT ABDOMEN PELVIS W WO CONTRAST  Addendum Date: 12/16/2020   ADDENDUM REPORT: 12/16/2020 09:14 ADDENDUM: Upon subsequent review the following vascular findings of potential clinical significance are noted: -(image  60/4) replaced RIGHT hepatic arterial supply arises from the SMA passing immediately posterior to the tumor. SMA passing posterior to tumor on image 54/4. -(image 25/9) teardrop morphology of the SMV is a finding that is associated with SMV involvement and is seen in the current case. These results will be called to the ordering clinician or representative by the Radiologist Assistant, and communication documented in the PACS or [employer]. Electronically Signed   By: Zetta Bills M.D.   On: 12/16/2020 09:14   Result Date: 12/16/2020 CLINICAL DATA:  Recent diagnosis of pancreatic cancer.  Staging. EXAM: CT ABDOMEN AND PELVIS WITHOUT AND WITH CONTRAST TECHNIQUE: Multidetector CT imaging of the abdomen and pelvis was performed following the standard protocol before and following the bolus administration of intravenous contrast. CONTRAST:  150m OMNIPAQUE IOHEXOL 350 MG/ML SOLN COMPARISON:  MRCP 11/08/2020.  Pelvic CT 11/06/2020. FINDINGS: Lower chest: Clear lung bases. No significant pleural or pericardial effusion. Hepatobiliary: Interval flexible biliary stent placement. The stent is relatively inferiorly positioned, but grossly stable compared with ERCP images [id number] and patent. There is air and contrast within the biliary system which is no  longer significantly dilated. Mild nonspecific gallbladder wall thickening. No evidence of calcified gallstones. There are no focal hepatic lesions to suggest metastatic disease. Pancreas: Ill-defined mass in the pancreatic head and uncinate process is best seen on series 9, measuring up to 1.9 x 2.1 cm on image 27. Associated pancreatic ductal dilatation up to 8 mm, similar to previous study. This pancreatic mass causes no arterial or venous encasement or occlusion. Spleen: Normal in size without focal abnormality. Adrenals/Urinary Tract: Both adrenal glands appear normal. Small renal cysts. No evidence of renal mass, urinary tract calculus or hydronephrosis. The  bladder appears unremarkable. Stomach/Bowel: Enteric contrast was administered and has passed into the distal small bowel. There is a small hiatal hernia. The stomach otherwise appears unremarkable for its degree of distention. No evidence of bowel wall thickening, distention or surrounding inflammation. The appendix is immediately inferior to the liver and appears unremarkable. Vascular/Lymphatic: There are no enlarged abdominal or pelvic lymph nodes. Aortic and branch vessel atherosclerosis without evidence of aneurysm. As above, no evidence of vascular encasement or occlusion by the pancreatic mass. The portal, superior mesenteric and splenic veins are patent. Reproductive: The prostate gland and seminal vesicles appear unremarkable. Other: No ascites or peritoneal nodularity. The abdominal wall appears intact. Musculoskeletal: No acute or significant osseous findings. Mild lumbar spine facet arthropathy. The sacroiliac joints are partially ankylosed bilaterally. IMPRESSION: 1. Grossly stable ill-defined mass within the pancreatic head and uncinate process, measuring up to 2.1 cm in diameter consistent with known pancreatic cancer. No evidence of vascular encasement or occlusion. 2. Resolution of biliary dilatation status post biliary stent placement. Pneumobilia confirming stent patency. Unchanged pancreatic ductal dilatation. 3. No evidence of metastatic disease. 4.  Aortic Atherosclerosis (ICD10-I70.0). Electronically Signed: By: Richardean Sale M.D. On: 12/06/2020 08:27   X-ray abdomen AP  Result Date: 12/27/2020 CLINICAL DATA:  Pancreatic cancer post stent replacement. The original stent could not be located during ERCP. EXAM: ABDOMEN - 1 VIEW COMPARISON:  CT 12/05/2020, PET-CT 12/14/2020 and images from ERCP done earlier today. FINDINGS: New metallic biliary stent in place with waisting in its mid section. The preexistent plastic biliary stent overlaps the left paraspinal region, displaced from its  location on recent CT and PET, presumably intraluminal within the proximal small bowel. No supine evidence of free intraperitoneal air or bowel obstruction. Mild degenerative changes are present within the lumbar spine. IMPRESSION: 1. The plastic biliary stent is displaced from its recent location, overlapping the left paraspinal region, presumably within the proximal small bowel. 2. New biliary stent appears satisfactorily positioned. Electronically Signed   By: Richardean Sale M.D.   On: 12/27/2020 15:41   NM PET Image Initial (PI) Skull Base To Thigh  Result Date: 12/16/2020 CLINICAL DATA:  Initial treatment strategy for pancreatic cancer staging in a 65 year old male. EXAM: NUCLEAR MEDICINE PET SKULL BASE TO THIGH TECHNIQUE: 9.52 mCi F-18 FDG was injected intravenously. Full-ring PET imaging was performed from the skull base to thigh after the radiotracer. CT data was obtained and used for attenuation correction and anatomic localization. Fasting blood glucose: 130 for mg/dl COMPARISON:  CT of the abdomen and pelvis of December 05, 2020. FINDINGS: Mediastinal blood pool activity: SUV max 2.12 Liver activity: SUV max NA NECK: No hypermetabolic lymph nodes in the neck. Incidental CT findings: none CHEST: No hypermetabolic mediastinal or hilar nodes. No suspicious pulmonary nodules on the CT scan. Incidental CT findings: Aortic atherosclerosis and 3 vessel coronary artery disease. Heart size normal without pericardial effusion. Esophagus grossly  normal. Lungs are clear. Airways are patent. ABDOMEN/PELVIS: Hypermetabolic mass in the pancreatic head with a maximum SUV of 5.3. Stent traverses the common bile duct in the intrapancreatic portion as on the recent CT evaluation. Findings of note from recent CT include replaced RIGHT hepatic artery arising from the SMA anterior drop shape of SMV. Small lymph nodes in the area without significant metabolic activity. No sign of focal, suspicious hepatic lesion or focal  metabolic activity in the liver. No solid organ uptake or nodal disease. Incidental CT findings: Cholelithiasis with distended gallbladder. Pneumobilia biliary stent in place traversing pancreatic portion of common bile duct and the site of pancreatic mass. Peripheral pancreatic ductal dilation with mild peripancreatic stranding which is similar to previous imaging. Normal appearance of the spleen, adrenal glands and kidneys. Decompressed urinary bladder. Small hiatal hernia. Small bowel is normal caliber. No acute gastrointestinal process with colonic diverticulosis. Aortic atherosclerosis without signs of aneurysm. Scattered small retroperitoneal lymph nodes including in ovoid intra-aortocaval lymph node measuring 9 mm (image 188/3 maximum SUV of 2.1) SKELETON: No focal hypermetabolic activity to suggest skeletal metastasis. Incidental CT findings: Spinal degenerative changes. No acute or destructive bone findings. IMPRESSION: Mass in the pancreatic head with hypermetabolic features, no current signs of metastatic disease. Vascular findings of potential significance: -replaced RIGHT hepatic arterial supply arises from the SMA. -teardrop morphology of the SMV. Intra-aortocaval lymph node with mildly rounded morphology but without increased metabolic activity on FDG PET, attention on follow-up. Cholelithiasis. Aortic Atherosclerosis (ICD10-I70.0). Electronically Signed   By: Zetta Bills M.D.   On: 12/16/2020 08:38   DG Chest Port 1 View  Result Date: 12/27/2020 CLINICAL DATA:  Fever. EXAM: PORTABLE CHEST 1 VIEW COMPARISON:  March 13, 2007. FINDINGS: The heart size and mediastinal contours are within normal limits. Both lungs are clear. Right internal jugular Port-A-Cath is noted with distal tip in expected position of the SVC. The visualized skeletal structures are unremarkable. IMPRESSION: No active disease. Electronically Signed   By: Marijo Conception M.D.   On: 12/27/2020 12:14   DG ERCP  Result Date:  12/27/2020 CLINICAL DATA:  ERCP with stent replacement.  Pancreatic cancer. EXAM: ERCP TECHNIQUE: Multiple spot images obtained with the fluoroscopic device and submitted for interpretation post-procedure. FLUOROSCOPY TIME:  Fluoroscopy Time:  2 minutes and 16 seconds Radiation Exposure Index (if provided by the fluoroscopic device): 46.4 mGy Number of Acquired Spot Images: 0 COMPARISON:  Abdominal CT 12/05/2020 and PET-CT 12/14/2020. FINDINGS: The submitted images demonstrate moderate dilatation of the common hepatic duct and mild intrahepatic biliary dilatation. There is mild wall irregularity of the common hepatic duct, but no discrete stones are seen. There is high-grade focal narrowing of the common bile duct at the level of the pancreatic head. Final images demonstrate the placement of a metallic biliary stent across the common bile duct with waisting within the pancreatic head. There is decompression of the biliary system on the final image. The plastic biliary stent is visible on some of the images overlapping the spine, better seen on the postoperative abdominal radiograph. IMPRESSION: Images during biliary stent exchange for pancreatic cancer as described. The known displaced plastic biliary stent is partially visualized, better seen on postoperative radiographs. These images were submitted for radiologic interpretation only. Please see the procedural report for the amount of contrast and the fluoroscopy time utilized. Electronically Signed   By: Richardean Sale M.D.   On: 12/27/2020 15:49   IR IMAGING GUIDED PORT INSERTION  Result Date: 12/18/2020 INDICATION: 65 year old  male with history of pancreatic cancer requiring central venous access for chemotherapy. EXAM: IMPLANTED PORT A CATH PLACEMENT WITH ULTRASOUND AND FLUOROSCOPIC GUIDANCE COMPARISON:  None. MEDICATIONS: None. ANESTHESIA/SEDATION: Moderate (conscious) sedation was employed during this procedure. A total of Versed 1 mg and Fentanyl 100 mcg  was administered intravenously. Moderate Sedation Time: 21 minutes. The patient's level of consciousness and vital signs were monitored continuously by radiology nursing throughout the procedure under my direct supervision. CONTRAST:  None FLUOROSCOPY TIME:  0 minutes, 6 seconds (2 mGy) COMPLICATIONS: None immediate. PROCEDURE: The procedure, risks, benefits, and alternatives were explained to the patient. Questions regarding the procedure were encouraged and answered. The patient understands and consents to the procedure. The right neck and chest were prepped with chlorhexidine in a sterile fashion, and a sterile drape was applied covering the operative field. Maximum barrier sterile technique with sterile gowns and gloves were used for the procedure. A timeout was performed prior to the initiation of the procedure. Ultrasound was used to examine the jugular vein which was compressible and free of internal echoes. A skin marker was used to demarcate the planned venotomy and port pocket incision sites. Local anesthesia was provided to these sites and the subcutaneous tunnel track with 1% lidocaine with 1:100,000 epinephrine. A small incision was created at the jugular access site and blunt dissection was performed of the subcutaneous tissues. Under ultrasound guidance, the jugular vein was accessed with a 21 ga micropuncture needle and an 0.018" wire was inserted to the superior vena cava. Real-time ultrasound guidance was utilized for vascular access including the acquisition of a permanent ultrasound image documenting patency of the accessed vessel. A 5 Fr micopuncture set was then used, through which a 0.035" Rosen wire was passed under fluoroscopic guidance into the inferior vena cava. An 8 Fr dilator was then placed over the wire. A subcutaneous port pocket was then created along the upper chest wall utilizing a combination of sharp and blunt dissection. The pocket was irrigated with sterile saline, packed with  gauze, and observed for hemorrhage. A single lumen "ISP" sized power injectable port was chosen for placement. The 8 Fr catheter was tunneled from the port pocket site to the venotomy incision. The port was placed in the pocket. The external catheter was trimmed to appropriate length. The dilator was exchanged for an 8 Fr peel-away sheath under fluoroscopic guidance. The catheter was then placed through the sheath and the sheath was removed. Final catheter positioning was confirmed and documented with a fluoroscopic spot radiograph. The port was accessed with a Huber needle, aspirated, and flushed with heparinized saline. The deep dermal layer of the port pocket incision was closed with interrupted 3-0 Vicryl suture. Dermabond was then placed over the port pocket and neck incisions. The patient tolerated the procedure well without immediate post procedural complication. FINDINGS: After catheter placement, the tip lies within the superior cavoatrial junction. The catheter aspirates and flushes normally and is ready for immediate use. IMPRESSION: Successful placement of a power injectable Port-A-Cath via the right internal jugular vein. The catheter is ready for immediate use. Ruthann Cancer, MD [facility] Electronically Signed   By: Ruthann Cancer MD   On: 12/18/2020 14:35    Subjective: Very eager to go home  Discharge Exam: Vitals:   12/29/20 2101 12/30/20 0546  BP: (!) 164/93 (!) 155/95  Pulse: 77 73  Resp: 20 16  Temp: 98.3 F (36.8 C) 97.9 F (36.6 C)  SpO2: 98% 97%  Vitals:   12/29/20 0523 12/29/20 1307 12/29/20 2101 12/30/20 0546  BP: (!) 160/94 (!) 169/99 (!) 164/93 (!) 155/95  Pulse: 84 79 77 73  Resp: '20 17 20 16  '$ Temp: 98.2 F (36.8 C) 98.4 F (36.9 C) 98.3 F (36.8 C) 97.9 F (36.6 C)  TempSrc: Oral Oral Oral Oral  SpO2: 96% 97% 98% 97%  Weight:      Height:        General: Pt is alert, awake, not in acute  distress Cardiovascular: RRR, S1/S2 + Respiratory: CTA bilaterally, no wheezing, no rhonchi Abdominal: Soft, NT, ND, bowel sounds + Extremities: no edema, no cyanosis   The results of significant diagnostics from this hospitalization (including imaging, microbiology, ancillary and laboratory) are listed below for reference.     Microbiology: Recent Results (from the past 240 hour(s))  Culture, blood (Routine X 2) w Reflex to ID Panel     Status: Abnormal   Collection Time: 12/27/20 11:26 AM   Specimen: BLOOD  Result Value Ref Range Status   Specimen Description   Final    BLOOD RIGHT ANTECUBITAL Performed at [facility], [address] [place], [place] [address]    Special Requests   Final    BOTTLES DRAWN AEROBIC AND ANAEROBIC Blood Culture adequate volume Performed at [facility], [address], [place], [place] [address]    Culture  Setup Time   Final    GRAM NEGATIVE RODS IN BOTH AEROBIC AND ANAEROBIC BOTTLES Gram Stain Report Called to,Read Back By and Verified With: Evans,T'@0015'$  by Zigmund Daniel, b 8.11.22 CRITICAL RESULT CALLED TO, READ BACK BY AND VERIFIED WITH: Patricia Pesa RN '@0152'$  12/28/20 EB  Performed at [facility], [place] [address], [place], [place] [address]    Culture (A)  Final    ESCHERICHIA COLI KLEBSIELLA PNEUMONIAE UNABLE TO ISOLATE KLEBSIELLA OXYTOCA    Report Status 12/30/2020 FINAL  Final   Organism ID, Bacteria ESCHERICHIA COLI  Final   Organism ID, Bacteria KLEBSIELLA PNEUMONIAE  Final      Susceptibility   Escherichia coli - MIC*    AMPICILLIN 8 SENSITIVE Sensitive     CEFAZOLIN <=4 SENSITIVE Sensitive     CEFEPIME <=0.12 SENSITIVE Sensitive     CEFTAZIDIME <=1 SENSITIVE Sensitive     CEFTRIAXONE <=0.25 SENSITIVE Sensitive     CIPROFLOXACIN <=0.25 SENSITIVE Sensitive     GENTAMICIN <=1 SENSITIVE Sensitive     IMIPENEM <=0.25 SENSITIVE Sensitive     TRIMETH/SULFA <=20 SENSITIVE Sensitive     AMPICILLIN/SULBACTAM 4 SENSITIVE Sensitive      PIP/TAZO <=4 SENSITIVE Sensitive     * ESCHERICHIA COLI   Klebsiella pneumoniae - MIC*    AMPICILLIN >=32 RESISTANT Resistant     CEFAZOLIN <=4 SENSITIVE Sensitive     CEFEPIME <=0.12 SENSITIVE Sensitive     CEFTAZIDIME <=1 SENSITIVE Sensitive     CEFTRIAXONE <=0.25 SENSITIVE Sensitive     CIPROFLOXACIN <=0.25 SENSITIVE Sensitive     GENTAMICIN <=1 SENSITIVE Sensitive     IMIPENEM <=0.25 SENSITIVE Sensitive     TRIMETH/SULFA <=20 SENSITIVE Sensitive     AMPICILLIN/SULBACTAM 4 SENSITIVE Sensitive     PIP/TAZO <=4 SENSITIVE Sensitive     * KLEBSIELLA PNEUMONIAE  Blood Culture ID Panel (Reflexed)     Status: Abnormal   Collection Time: 12/27/20 11:26 AM  Result Value Ref Range Status   Enterococcus faecalis NOT DETECTED NOT DETECTED Final   Enterococcus Faecium NOT DETECTED NOT DETECTED Final   Listeria monocytogenes  NOT DETECTED NOT DETECTED Final   Staphylococcus species NOT DETECTED NOT DETECTED Final   Staphylococcus aureus (BCID) NOT DETECTED NOT DETECTED Final   Staphylococcus epidermidis NOT DETECTED NOT DETECTED Final   Staphylococcus lugdunensis NOT DETECTED NOT DETECTED Final   Streptococcus species NOT DETECTED NOT DETECTED Final   Streptococcus agalactiae NOT DETECTED NOT DETECTED Final   Streptococcus pneumoniae NOT DETECTED NOT DETECTED Final   Streptococcus pyogenes NOT DETECTED NOT DETECTED Final   A.calcoaceticus-baumannii NOT DETECTED NOT DETECTED Final   Bacteroides fragilis NOT DETECTED NOT DETECTED Final   Enterobacterales DETECTED (A) NOT DETECTED Final    Comment: CRITICAL RESULT CALLED TO, READ BACK BY AND VERIFIED WITH: TINA EVANS RN '@0152'$  12/28/20 EB     Enterobacter cloacae complex NOT DETECTED NOT DETECTED Final   Escherichia coli DETECTED (A) NOT DETECTED Final    Comment: CRITICAL RESULT CALLED TO, READ BACK BY AND VERIFIED WITH: TINA EVANS RN '@0152'$  12/28/20 EB     Klebsiella aerogenes NOT DETECTED NOT DETECTED Final   Klebsiella oxytoca DETECTED (A)  NOT DETECTED Final    Comment: CRITICAL RESULT CALLED TO, READ BACK BY AND VERIFIED WITH: TINA EVANS RN '@0152'$  12/28/20 EB     Klebsiella pneumoniae DETECTED (A) NOT DETECTED Final    Comment: CRITICAL RESULT CALLED TO, READ BACK BY AND VERIFIED WITH: TINA EVANS RN '@0152'$  12/28/20 EB     Proteus species NOT DETECTED NOT DETECTED Final   Salmonella species NOT DETECTED NOT DETECTED Final   Serratia marcescens NOT DETECTED NOT DETECTED Final   Haemophilus influenzae NOT DETECTED NOT DETECTED Final   Neisseria meningitidis NOT DETECTED NOT DETECTED Final   Pseudomonas aeruginosa NOT DETECTED NOT DETECTED Final   Stenotrophomonas maltophilia NOT DETECTED NOT DETECTED Final   Candida albicans NOT DETECTED NOT DETECTED Final   Candida auris NOT DETECTED NOT DETECTED Final   Candida glabrata NOT DETECTED NOT DETECTED Final   Candida krusei NOT DETECTED NOT DETECTED Final   Candida parapsilosis NOT DETECTED NOT DETECTED Final   Candida tropicalis NOT DETECTED NOT DETECTED Final   Cryptococcus neoformans/gattii NOT DETECTED NOT DETECTED Final   CTX-M ESBL NOT DETECTED NOT DETECTED Final   Carbapenem resistance IMP NOT DETECTED NOT DETECTED Final   Carbapenem resistance KPC NOT DETECTED NOT DETECTED Final   Carbapenem resistance NDM NOT DETECTED NOT DETECTED Final   Carbapenem resist OXA 48 LIKE NOT DETECTED NOT DETECTED Final   Carbapenem resistance VIM NOT DETECTED NOT DETECTED Final    Comment: Performed at [facility], [address] [place], [place] [address]  Resp Panel by RT-PCR (Flu A&B, Covid) Nasopharyngeal Swab     Status: None   Collection Time: 12/27/20 11:38 AM   Specimen: Nasopharyngeal Swab; Nasopharyngeal(NP) swabs in vial transport medium  Result Value Ref Range Status   SARS Coronavirus 2 by RT PCR NEGATIVE NEGATIVE Final    Comment: (NOTE) SARS-CoV-2 target nucleic acids are NOT DETECTED.  The SARS-CoV-2 RNA is generally detectable in upper respiratory specimens  during the acute phase of infection. The lowest concentration of SARS-CoV-2 viral copies this assay can detect is 138 copies/mL. A negative result does not preclude SARS-Cov-2 infection and should not be used as the sole basis for treatment or other patient management decisions. A negative result may occur with  improper specimen collection/handling, submission of specimen other than nasopharyngeal swab, presence of viral mutation(s) within the areas targeted by this assay, and inadequate number of viral copies(<138 copies/mL). A negative result must  be combined with clinical observations, patient history, and epidemiological information. The expected result is Negative.  Fact Sheet for Patients:  EntrepreneurPulse.com.au  Fact Sheet for Healthcare Providers:  IncredibleEmployment.be  This test is no t yet approved or cleared by the [place] FDA and  has been authorized for detection and/or diagnosis of SARS-CoV-2 by FDA under an Emergency Use Authorization (EUA). This EUA will remain  in effect (meaning this test can be used) for the duration of the COVID-19 declaration under Section 564(b)(1) of the Act, 21 U.S.C.section 360bbb-3(b)(1), unless the authorization is terminated  or revoked sooner.       Influenza A by PCR NEGATIVE NEGATIVE Final   Influenza B by PCR NEGATIVE NEGATIVE Final    Comment: (NOTE) The Xpert Xpress SARS-CoV-2/FLU/RSV plus assay is intended as an aid in the diagnosis of influenza from Nasopharyngeal swab specimens and should not be used as a sole basis for treatment. Nasal washings and aspirates are unacceptable for Xpert Xpress SARS-CoV-2/FLU/RSV testing.  Fact Sheet for Patients: EntrepreneurPulse.com.au  Fact Sheet for Healthcare Providers: IncredibleEmployment.be  This test is not yet approved or cleared by the [place] FDA and has been authorized for detection  and/or diagnosis of SARS-CoV-2 by FDA under an Emergency Use Authorization (EUA). This EUA will remain in effect (meaning this test can be used) for the duration of the COVID-19 declaration under Section 564(b)(1) of the Act, 21 U.S.C. section 360bbb-3(b)(1), unless the authorization is terminated or revoked.  Performed at [facility], [address], [place], [place] [address]   Urine Culture     Status: Abnormal   Collection Time: 12/27/20 11:43 AM   Specimen: In/Out Cath Urine  Result Value Ref Range Status   Specimen Description   Final    IN/OUT CATH URINE Performed at [facility], [address], [place], [place] [address]    Special Requests   Final    NONE Performed at [facility], [address] [place], [place] [address]    Culture 4,000 COLONIES/mL STAPHYLOCOCCUS CAPITIS (A)  Final   Report Status 12/28/2020 FINAL  Final  Culture, blood (Routine X 2) w Reflex to ID Panel     Status: Abnormal   Collection Time: 12/27/20 12:13 PM   Specimen: BLOOD  Result Value Ref Range Status   Specimen Description   Final    BLOOD LEFT ANTECUBITAL Performed at [facility], [address] [place], [place] [address]    Special Requests   Final    BOTTLES DRAWN AEROBIC AND ANAEROBIC Blood Culture adequate volume Performed at [facility], [address], [place], [place] [address]    Culture  Setup Time   Final    GRAM NEGATIVE RODS ANAEROBIC AND AEROBIC BOTTLES Gram Stain Report Called to,Read Back By and Verified With: EVANS,T'@0325'$  BY MATTHEWS, B 8.11.22  [facility] Performed at [facility], [address], [place], [place] [address]    Culture (A)  Final    KLEBSIELLA PNEUMONIAE SUSCEPTIBILITIES PERFORMED ON PREVIOUS CULTURE WITHIN THE LAST 5 DAYS. Performed at [facility], [place] [address], [place], [place] [address]    Report Status 12/30/2020 FINAL  Final     Labs: BNP (last 3 results) No results for input(s): BNP in the last 8760 hours. Basic Metabolic  Panel: Recent Labs  Lab 12/27/20 1057 12/27/20 1812 12/28/20 0518 12/29/20 0413 12/30/20 0537  NA 137  --  137 136 136  K 3.9  --  4.2 3.9 4.5  CL  106  --  107 106 102  CO2 22  --  '22 25 29  '$ GLUCOSE 189*  --  198* 189* 216*  BUN 23  --  30* 24* 15  CREATININE 1.11 1.29* 1.11 0.95 0.88  CALCIUM 8.2*  --  7.8* 7.8* 8.4*   Liver Function Tests: Recent Labs  Lab 12/26/20 1826 12/27/20 1057 12/28/20 0518 12/29/20 0413 12/30/20 0537  AST 365* 195* 83* 39 21  ALT 168* 183* 105* 71* 54*  ALKPHOS 339* 342* 224* 220* 236*  BILITOT 1.6* 4.5* 3.3* 1.2 1.2  PROT 7.0 6.8 5.2* 4.9* 5.6*  ALBUMIN 3.5 3.2* 2.4* 2.1* 2.3*   Recent Labs  Lab 12/27/20 1126  LIPASE 17   No results for input(s): AMMONIA in the last 168 hours. CBC: Recent Labs  Lab 12/27/20 1057 12/28/20 0518 12/29/20 0413  WBC 13.5* 19.5* 11.6*  NEUTROABS 12.7*  --   --   HGB 13.9 11.8* 10.9*  HCT 42.6 37.5* 33.6*  MCV 98.6 99.5 96.3  PLT 248 177 160   Cardiac Enzymes: No results for input(s): CKTOTAL, CKMB, CKMBINDEX, TROPONINI in the last 168 hours. BNP: Invalid input(s): POCBNP CBG: Recent Labs  Lab 12/29/20 1111 12/29/20 1622 12/29/20 2103 12/30/20 0726 12/30/20 1118  GLUCAP 220* 249* 272* 188* 269*   D-Dimer No results for input(s): DDIMER in the last 72 hours. Hgb A1c No results for input(s): HGBA1C in the last 72 hours. Lipid Profile No results for input(s): CHOL, HDL, LDLCALC, TRIG, CHOLHDL, LDLDIRECT in the last 72 hours. Thyroid function studies No results for input(s): TSH, T4TOTAL, T3FREE, THYROIDAB in the last 72 hours.  Invalid input(s): FREET3 Anemia work up No results for input(s): VITAMINB12, FOLATE, FERRITIN, TIBC, IRON, RETICCTPCT in the last 72 hours. Urinalysis    Component Value Date/Time   COLORURINE AMBER (A) 12/27/2020 1137   APPEARANCEUR HAZY (A) 12/27/2020 1137   LABSPEC 1.036 (H) 12/27/2020 1137   PHURINE 5.0 12/27/2020 1137   GLUCOSEU 50 (A) 12/27/2020 1137    HGBUR MODERATE (A) 12/27/2020 1137   BILIRUBINUR MODERATE (A) 12/27/2020 1137   KETONESUR 5 (A) 12/27/2020 1137   PROTEINUR 100 (A) 12/27/2020 1137   NITRITE NEGATIVE 12/27/2020 1137   LEUKOCYTESUR NEGATIVE 12/27/2020 1137   Sepsis Labs Invalid input(s): PROCALCITONIN,  WBC,  LACTICIDVEN Microbiology Recent Results (from the past 240 hour(s))  Culture, blood (Routine X 2) w Reflex to ID Panel     Status: Abnormal   Collection Time: 12/27/20 11:26 AM   Specimen: BLOOD  Result Value Ref Range Status   Specimen Description   Final    BLOOD RIGHT ANTECUBITAL Performed at [facility] [address] [place], [place] [address]    Special Requests   Final    BOTTLES DRAWN AEROBIC AND ANAEROBIC Blood Culture adequate volume Performed at [facility], [address] [place], [place] [address]    Culture  Setup Time   Final    GRAM NEGATIVE RODS IN BOTH AEROBIC AND ANAEROBIC BOTTLES Gram Stain Report Called to,Read Back By and Verified With: Evans,T'@0015'$  by Zigmund Daniel, b 8.11.22 CRITICAL RESULT CALLED TO, READ BACK BY AND VERIFIED WITH: Patricia Pesa RN '@0152'$  12/28/20 EB  Performed at [facility], [place] [address], [place], [place] [address]    Culture (A)  Final    ESCHERICHIA COLI KLEBSIELLA PNEUMONIAE UNABLE TO ISOLATE KLEBSIELLA OXYTOCA    Report Status 12/30/2020 FINAL  Final   Organism ID, Bacteria ESCHERICHIA COLI  Final   Organism ID,  Bacteria KLEBSIELLA PNEUMONIAE  Final      Susceptibility   Escherichia coli - MIC*    AMPICILLIN 8 SENSITIVE Sensitive     CEFAZOLIN <=4 SENSITIVE Sensitive     CEFEPIME <=0.12 SENSITIVE Sensitive     CEFTAZIDIME <=1 SENSITIVE Sensitive     CEFTRIAXONE <=0.25 SENSITIVE Sensitive     CIPROFLOXACIN <=0.25 SENSITIVE Sensitive     GENTAMICIN <=1 SENSITIVE Sensitive     IMIPENEM <=0.25 SENSITIVE Sensitive     TRIMETH/SULFA <=20 SENSITIVE Sensitive     AMPICILLIN/SULBACTAM 4 SENSITIVE Sensitive     PIP/TAZO <=4 SENSITIVE Sensitive     *  ESCHERICHIA COLI   Klebsiella pneumoniae - MIC*    AMPICILLIN >=32 RESISTANT Resistant     CEFAZOLIN <=4 SENSITIVE Sensitive     CEFEPIME <=0.12 SENSITIVE Sensitive     CEFTAZIDIME <=1 SENSITIVE Sensitive     CEFTRIAXONE <=0.25 SENSITIVE Sensitive     CIPROFLOXACIN <=0.25 SENSITIVE Sensitive     GENTAMICIN <=1 SENSITIVE Sensitive     IMIPENEM <=0.25 SENSITIVE Sensitive     TRIMETH/SULFA <=20 SENSITIVE Sensitive     AMPICILLIN/SULBACTAM 4 SENSITIVE Sensitive     PIP/TAZO <=4 SENSITIVE Sensitive     * KLEBSIELLA PNEUMONIAE  Blood Culture ID Panel (Reflexed)     Status: Abnormal   Collection Time: 12/27/20 11:26 AM  Result Value Ref Range Status   Enterococcus faecalis NOT DETECTED NOT DETECTED Final   Enterococcus Faecium NOT DETECTED NOT DETECTED Final   Listeria monocytogenes NOT DETECTED NOT DETECTED Final   Staphylococcus species NOT DETECTED NOT DETECTED Final   Staphylococcus aureus (BCID) NOT DETECTED NOT DETECTED Final   Staphylococcus epidermidis NOT DETECTED NOT DETECTED Final   Staphylococcus lugdunensis NOT DETECTED NOT DETECTED Final   Streptococcus species NOT DETECTED NOT DETECTED Final   Streptococcus agalactiae NOT DETECTED NOT DETECTED Final   Streptococcus pneumoniae NOT DETECTED NOT DETECTED Final   Streptococcus pyogenes NOT DETECTED NOT DETECTED Final   A.calcoaceticus-baumannii NOT DETECTED NOT DETECTED Final   Bacteroides fragilis NOT DETECTED NOT DETECTED Final   Enterobacterales DETECTED (A) NOT DETECTED Final    Comment: CRITICAL RESULT CALLED TO, READ BACK BY AND VERIFIED WITH: TINA EVANS RN '@0152'$  12/28/20 EB     Enterobacter cloacae complex NOT DETECTED NOT DETECTED Final   Escherichia coli DETECTED (A) NOT DETECTED Final    Comment: CRITICAL RESULT CALLED TO, READ BACK BY AND VERIFIED WITH: TINA EVANS RN '@0152'$  12/28/20 EB     Klebsiella aerogenes NOT DETECTED NOT DETECTED Final   Klebsiella oxytoca DETECTED (A) NOT DETECTED Final    Comment: CRITICAL  RESULT CALLED TO, READ BACK BY AND VERIFIED WITH: TINA EVANS RN '@0152'$  12/28/20 EB     Klebsiella pneumoniae DETECTED (A) NOT DETECTED Final    Comment: CRITICAL RESULT CALLED TO, READ BACK BY AND VERIFIED WITH: TINA EVANS RN '@0152'$  12/28/20 EB     Proteus species NOT DETECTED NOT DETECTED Final   Salmonella species NOT DETECTED NOT DETECTED Final   Serratia marcescens NOT DETECTED NOT DETECTED Final   Haemophilus influenzae NOT DETECTED NOT DETECTED Final   Neisseria meningitidis NOT DETECTED NOT DETECTED Final   Pseudomonas aeruginosa NOT DETECTED NOT DETECTED Final   Stenotrophomonas maltophilia NOT DETECTED NOT DETECTED Final   Candida albicans NOT DETECTED NOT DETECTED Final   Candida auris NOT DETECTED NOT DETECTED Final   Candida glabrata NOT DETECTED NOT DETECTED Final   Candida krusei NOT DETECTED NOT DETECTED Final   Candida parapsilosis  NOT DETECTED NOT DETECTED Final   Candida tropicalis NOT DETECTED NOT DETECTED Final   Cryptococcus neoformans/gattii NOT DETECTED NOT DETECTED Final   CTX-M ESBL NOT DETECTED NOT DETECTED Final   Carbapenem resistance IMP NOT DETECTED NOT DETECTED Final   Carbapenem resistance KPC NOT DETECTED NOT DETECTED Final   Carbapenem resistance NDM NOT DETECTED NOT DETECTED Final   Carbapenem resist OXA 48 LIKE NOT DETECTED NOT DETECTED Final   Carbapenem resistance VIM NOT DETECTED NOT DETECTED Final    Comment: Performed at [facility], [place] [address] [place], [place] [address]  Resp Panel by RT-PCR (Flu A&B, Covid) Nasopharyngeal Swab     Status: None   Collection Time: 12/27/20 11:38 AM   Specimen: Nasopharyngeal Swab; Nasopharyngeal(NP) swabs in vial transport medium  Result Value Ref Range Status   SARS Coronavirus 2 by RT PCR NEGATIVE NEGATIVE Final    Comment: (NOTE) SARS-CoV-2 target nucleic acids are NOT DETECTED.  The SARS-CoV-2 RNA is generally detectable in upper respiratory specimens during the acute phase of infection. The  lowest concentration of SARS-CoV-2 viral copies this assay can detect is 138 copies/mL. A negative result does not preclude SARS-Cov-2 infection and should not be used as the sole basis for treatment or other patient management decisions. A negative result may occur with  improper specimen collection/handling, submission of specimen other than nasopharyngeal swab, presence of viral mutation(s) within the areas targeted by this assay, and inadequate number of viral copies(<138 copies/mL). A negative result must be combined with clinical observations, patient history, and epidemiological information. The expected result is Negative.  Fact Sheet for Patients:  EntrepreneurPulse.com.au  Fact Sheet for Healthcare Providers:  IncredibleEmployment.be  This test is no t yet approved or cleared by the [place] FDA and  has been authorized for detection and/or diagnosis of SARS-CoV-2 by FDA under an Emergency Use Authorization (EUA). This EUA will remain  in effect (meaning this test can be used) for the duration of the COVID-19 declaration under Section 564(b)(1) of the Act, 21 U.S.C.section 360bbb-3(b)(1), unless the authorization is terminated  or revoked sooner.       Influenza A by PCR NEGATIVE NEGATIVE Final   Influenza B by PCR NEGATIVE NEGATIVE Final    Comment: (NOTE) The Xpert Xpress SARS-CoV-2/FLU/RSV plus assay is intended as an aid in the diagnosis of influenza from Nasopharyngeal swab specimens and should not be used as a sole basis for treatment. Nasal washings and aspirates are unacceptable for Xpert Xpress SARS-CoV-2/FLU/RSV testing.  Fact Sheet for Patients: EntrepreneurPulse.com.au  Fact Sheet for Healthcare Providers: IncredibleEmployment.be  This test is not yet approved or cleared by the [place] FDA and has been authorized for detection and/or diagnosis of SARS-CoV-2 by FDA under  an Emergency Use Authorization (EUA). This EUA will remain in effect (meaning this test can be used) for the duration of the COVID-19 declaration under Section 564(b)(1) of the Act, 21 U.S.C. section 360bbb-3(b)(1), unless the authorization is terminated or revoked.  Performed at [facility], [address] [place], [place] [address]   Urine Culture     Status: Abnormal   Collection Time: 12/27/20 11:43 AM   Specimen: In/Out Cath Urine  Result Value Ref Range Status   Specimen Description   Final    IN/OUT CATH URINE Performed at [facility], [address], [place], [place] [address]    Special Requests   Final    NONE Performed at [facility], [address] [place], [place] [address]  Culture 4,000 COLONIES/mL STAPHYLOCOCCUS CAPITIS (A)  Final   Report Status 12/28/2020 FINAL  Final  Culture, blood (Routine X 2) w Reflex to ID Panel     Status: Abnormal   Collection Time: 12/27/20 12:13 PM   Specimen: BLOOD  Result Value Ref Range Status   Specimen Description   Final    BLOOD LEFT ANTECUBITAL Performed at [facility], [address], [place], [place] [address]    Special Requests   Final    BOTTLES DRAWN AEROBIC AND ANAEROBIC Blood Culture adequate volume Performed at [facility], [address], [place], [place] [address]    Culture  Setup Time   Final    GRAM NEGATIVE RODS ANAEROBIC AND AEROBIC BOTTLES Gram Stain Report Called to,Read Back By and Verified With: EVANS,T'@0325'$  BY MATTHEWS, B 8.11.22  [facility] Performed at [facility], [address], [place], [place] [address]    Culture (A)  Final    KLEBSIELLA PNEUMONIAE SUSCEPTIBILITIES PERFORMED ON PREVIOUS CULTURE WITHIN THE LAST 5 DAYS. Performed at [facility], [place] [address], [place], [place] [address]    Report Status 12/30/2020 FINAL  Final   Time spent: 30 min  SIGNED:   Marylu Lund, MD  [facility] 12/30/2020, 12:24 PM  If 7PM-7AM, please contact night-coverage

## 2020-12-30 NOTE — Progress Notes (Signed)
Nsg Discharge Note  Admit Date:  12/27/2020 Discharge date: 12/30/2020   Alexander Banks to be D/C'd Home per MD order.  AVS completed.  Copy for chart, and copy for patient signed, and dated. Patient/caregiver able to verbalize understanding. IV removed. Discharge paper work given and reviewed. No further questions regarding discharge papers. Stable upon discharge.   Discharge Medication: Allergies as of 12/30/2020       Reactions   Lisinopril Cough        Medication List     STOP taking these medications    ibuprofen 200 MG tablet Commonly known as: ADVIL   prochlorperazine 10 MG tablet Commonly known as: COMPAZINE   rosuvastatin 40 MG tablet Commonly known as: CRESTOR   sildenafil 100 MG tablet Commonly known as: Viagra       TAKE these medications    amoxicillin-clavulanate 875-125 MG tablet Commonly known as: Augmentin Take 1 tablet by mouth 2 (two) times daily for 5 days. Start taking on: December 31, 2020   dronabinol 5 MG capsule Commonly known as: MARINOL Take 1 capsule (5 mg total) by mouth 2 (two) times daily before a meal.   FreeStyle Libre 2 Reader [name] As directed   [employer] 2 Sensor Misc 1 Piece by Does not apply route every 14 (fourteen) days.   insulin detemir 100 UNIT/ML FlexPen Commonly known as: LEVEMIR Inject 40 Units into the skin at bedtime. What changed: how much to take   insulin lispro 100 UNIT/ML KwikPen Commonly known as: HumaLOG KwikPen Inject 14-20 Units into the skin 3 (three) times daily before meals. What changed: how much to take   IRON-VITAMIN C PO Take 1 tablet by mouth daily.   levothyroxine 175 MCG tablet Commonly known as: SYNTHROID Take 1 tablet (175 mcg total) by mouth daily before breakfast.   lidocaine-prilocaine cream Commonly known as: EMLA Apply a small amount to port a cath site (do not rub in) and cover with plastic wrap 1 hour prior to infusion appointments   MELATIN PO Take by mouth. Takes  2 qhs   ondansetron 8 MG tablet Commonly known as: Zofran Take 1 tablet (8 mg total) by mouth 2 (two) times daily as needed. Start on day 3 after chemotherapy.   OneTouch Delica Lancets [id number] Misc TEST ONE TIME DAILY   OSTEO BI-FLEX REGULAR STRENGTH PO Take 2 tablets by mouth daily.   oxyCODONE-acetaminophen 5-325 MG tablet Commonly known as: PERCOCET/ROXICET Take 1 tablet by mouth every 6 (six) hours as needed for severe pain.        Discharge Assessment: Vitals:   12/29/20 2101 12/30/20 0546  BP: (!) 164/93 (!) 155/95  Pulse: 77 73  Resp: 20 16  Temp: 98.3 F (36.8 C) 97.9 F (36.6 C)  SpO2: 98% 97%   Skin clean, dry and intact without evidence of skin break down, no evidence of skin tears noted. IV catheter discontinued intact. Site without signs and symptoms of complications - no redness or edema noted at insertion site, patient denies c/o pain - only slight tenderness at site.  Dressing with slight pressure applied.  D/c Instructions-Education: Discharge instructions given to patient/family with verbalized understanding. D/c education completed with patient/family including follow up instructions, medication list, d/c activities limitations if indicated, with other d/c instructions as indicated by MD - patient able to verbalize understanding, all questions fully answered. Patient instructed to return to ED, call 911, or call MD for any changes in condition.  Patient escorted via [place], and  D/C home via private auto.  Zenaida Deed, RN 12/30/2020 1:36 PM

## 2021-01-01 ENCOUNTER — Ambulatory Visit: Payer: BC Managed Care – PPO | Admitting: "Endocrinology

## 2021-01-01 ENCOUNTER — Other Ambulatory Visit: Payer: Self-pay

## 2021-01-01 ENCOUNTER — Encounter: Payer: Self-pay | Admitting: Nutrition

## 2021-01-01 ENCOUNTER — Telehealth: Payer: Self-pay | Admitting: *Deleted

## 2021-01-01 ENCOUNTER — Encounter: Payer: BC Managed Care – PPO | Attending: "Endocrinology | Admitting: Nutrition

## 2021-01-01 VITALS — Ht 74.5 in | Wt 199.0 lb

## 2021-01-01 DIAGNOSIS — E782 Mixed hyperlipidemia: Secondary | ICD-10-CM | POA: Insufficient documentation

## 2021-01-01 DIAGNOSIS — C259 Malignant neoplasm of pancreas, unspecified: Secondary | ICD-10-CM | POA: Insufficient documentation

## 2021-01-01 DIAGNOSIS — E1165 Type 2 diabetes mellitus with hyperglycemia: Secondary | ICD-10-CM | POA: Diagnosis not present

## 2021-01-01 DIAGNOSIS — I10 Essential (primary) hypertension: Secondary | ICD-10-CM | POA: Diagnosis not present

## 2021-01-01 DIAGNOSIS — E1169 Type 2 diabetes mellitus with other specified complication: Secondary | ICD-10-CM | POA: Diagnosis not present

## 2021-01-01 DIAGNOSIS — E785 Hyperlipidemia, unspecified: Secondary | ICD-10-CM | POA: Diagnosis not present

## 2021-01-01 DIAGNOSIS — D490 Neoplasm of unspecified behavior of digestive system: Secondary | ICD-10-CM | POA: Insufficient documentation

## 2021-01-01 NOTE — Progress Notes (Signed)
Medical Nutrition Therapy  Appointment Start time:  [id number]  Appointment End time:  1615  Primary concerns today:  DM Type 2,   Referral diagnosis: E11.8 Preferred learning style:no preference indicated Learning readiness: Change in progress   NUTRITION ASSESSMENT  Here with his wife. Was in the hospital due to gallbladder issues and found out he has pancreatic cancer. Starts chemo next Wednesday.  Had to have bile duct stent placed and then replaced. Had sepsis and was in the hospital for a few days. BS when up during infection.  No appetite.  Been trying to work on portion sizes and cut out processed foods. Working on eating better quality foods.  He has a Risk analyst and uses it. Had to be taken off when he got his PET scan and in the hospital. Currently taking 25 units of Levemir and 10-14 units of Humalog with meals depending on BS. Has already felt a change in his taste buds. Has a little bit if metallic taste. Complains of some anorexia. Has been trying to force himself to eat. Sometimes he is picky about what he wants to eat.  Motivated to work on eating better and gaining his weight back.  Scheduled to see Dr. Dorris Fetch tomorrow.  Has lost 50 lbs in 6 months  FBS: 96-193 mg/dl Before lunch: 69-213 mg/dl Before dinner 89-195 mg/dl  Before bed: 79-213 mg/dl.    Anthropometrics  CMP Latest Ref Rng & Units 12/30/2020 12/29/2020 12/28/2020  Glucose 70 - 99 mg/dL 216(H) 189(H) 198(H)  BUN 8 - 23 mg/dL 15 24(H) 30(H)  Creatinine 0.61 - 1.24 mg/dL 0.88 0.95 1.11  Sodium 135 - 145 mmol/L 136 136 137  Potassium 3.5 - 5.1 mmol/L 4.5 3.9 4.2  Chloride 98 - 111 mmol/L 102 106 107  CO2 22 - 32 mmol/L '29 25 22  '$ Calcium 8.9 - 10.3 mg/dL 8.4(L) 7.8(L) 7.8(L)  Total Protein 6.5 - 8.1 g/dL 5.6(L) 4.9(L) 5.2(L)  Total Bilirubin 0.3 - 1.2 mg/dL 1.2 1.2 3.3(H)  Alkaline Phos 38 - 126 U/L 236(H) 220(H) 224(H)  AST 15 - 41 U/L 21 39 83(H)  ALT 0 - 44 U/L 54(H) 71(H) 105(H)   Wt Readings from Last 3  Encounters:  01/01/21 199 lb (90.3 kg)  12/27/20 189 lb (85.7 kg)  12/21/20 197 lb (89.4 kg)   Ht Readings from Last 3 Encounters:  01/01/21 6' 2.5" (1.892 m)  12/27/20 6' 2.5" (1.892 m)  12/21/20 6' 2.5" (1.892 m)   Body mass index is 25.21 kg/m. '@BMIFA'$ @ Facility age limit for growth percentiles is 20 years. Facility age limit for growth percentiles is 20 years. CMP Latest Ref Rng & Units 12/30/2020 12/29/2020 12/28/2020  Glucose 70 - 99 mg/dL 216(H) 189(H) 198(H)  BUN 8 - 23 mg/dL 15 24(H) 30(H)  Creatinine 0.61 - 1.24 mg/dL 0.88 0.95 1.11  Sodium 135 - 145 mmol/L 136 136 137  Potassium 3.5 - 5.1 mmol/L 4.5 3.9 4.2  Chloride 98 - 111 mmol/L 102 106 107  CO2 22 - 32 mmol/L '29 25 22  '$ Calcium 8.9 - 10.3 mg/dL 8.4(L) 7.8(L) 7.8(L)  Total Protein 6.5 - 8.1 g/dL 5.6(L) 4.9(L) 5.2(L)  Total Bilirubin 0.3 - 1.2 mg/dL 1.2 1.2 3.3(H)  Alkaline Phos 38 - 126 U/L 236(H) 220(H) 224(H)  AST 15 - 41 U/L 21 39 83(H)  ALT 0 - 44 U/L 54(H) 71(H) 105(H)      Clinical Medical Hx: Pancreatic Cancer, DM Type 2 Medications:  Labs:  Lab Results  Component  Value Date   HGBA1C 12.8 (H) 11/04/2020    Notable Signs/Symptoms: fatigue, thirst, dry mouth, no appetite  Lifestyle & Dietary Hx LIves with his wife. Got dx with pancreatic cancer a few weeks ago. Just got over an infection and bile duct issues with sepsis.  Estimated daily fluid intake: 60 oz Supplements:  Sleep: varies  Stress / self-care: his health and diagnosis Current average weekly physical activity: ADL  24-Hr Dietary Recall First Meal: Pb toast 1 slice., cereal-Rice Krispies, Canteloupe Snack: none Second Meal: PB banana sandwich, water pretzels. Snack: none Third Meal: Tomato soup, grilled cheese sandwich, water Snack: none  Beverages: Water  Estimated Energy Needs Calories: 2200 Carbohydrate: 248g Protein: 165g Fat: 61g   NUTRITION DIAGNOSIS  NB-1.1 Food and nutrition-related knowledge deficit As related to  Diabetes Type 2.  As evidenced by A1C > 12% with new diagnosis of pancreatic cancer.   NUTRITION INTERVENTION  Nutrition education (E-1) on the following topics:  Nutrition and Diabetes education provided on My Plate, CHO counting, meal planning, portion sizes, timing of meals, avoiding snacks between meals unless having a low blood sugar, target ranges for A1C and blood sugars, signs/symptoms and treatment of hyper/hypoglycemia, monitoring blood sugars, taking medications as prescribed, benefits of exercising 30 minutes per day and prevention of complications of DM. Nutrition for before and after chemo treatments  Handouts Provided Include  My Plate Meal Plan Card   Learning Style & Readiness for Change Teaching method utilized: Visual & Auditory  Demonstrated degree of understanding via: Teach Back  Barriers to learning/adherence to lifestyle change: Cancer and chemo therapy  Goals Established by Pt Goals  Eat 60-75 g CHO at each meals Consider drinking 1 protein shake per day for protein needs Test BS 4 times per day. Call MD if BS are consistently lower than 80 or over 200's. Focus on nutrient dense foods and eat what you can. Medications can be adjusted. Avoid simple sugars   MONITORING & EVALUATION Dietary intake, weekly physical activity, and blood sugar in 1 month.  Next Steps  Patient is to focus on increasing calories with nutrient dense foods.Marland Kitchen

## 2021-01-01 NOTE — Telephone Encounter (Signed)
Transition Care Management Follow-up Telephone Call Date of discharge and from where: 12/30/20 from [facility] How have you been since you were released from the hospital? Doing well Any questions or concerns? No  Items Reviewed: Did the pt receive and understand the discharge instructions provided? Yes  Medications obtained and verified? Yes  Other? N/A Any new allergies since your discharge?  no Dietary orders reviewed? N/A Do you have support at home? Yes   Home Care and Equipment/Supplies: Were home health services ordered? no If so, what is the name of the agency? N/a  Has the agency set up a time to come to the patient's home? not applicable Were any new equipment or medical supplies ordered?  No What is the name of the medical supply agency? N/a Were you able to get the supplies/equipment? not applicable Do you have any questions related to the use of the equipment or supplies? N/a  Functional Questionnaire: (I = Independent and D = Dependent) ADLs: I  Bathing/Dressing- I  Meal Prep- I  Eating- I  Maintaining continence- I  Transferring/Ambulation- I  Managing Meds- I  Follow up appointments reviewed:  PCP Hospital f/u appt confirmed? Yes  Scheduled to see Dr Sallee Lange on 01/05/21 @ 11:20am. [facility] f/u appt confirmed? Patient states he is following up with oncology and endocrinology Are transportation arrangements needed? No  If their condition worsens, is the pt aware to call PCP or go to the Emergency Dept.? Yes Was the patient provided with contact information for the PCP's office or ED? Yes Was to pt encouraged to call back with questions or concerns? Yes

## 2021-01-01 NOTE — Patient Instructions (Addendum)
Goals  Eat 60-75 g CHO at each meals Consider drinking 1 protein shake per day for protein needs Test BS 4 times per day. Call MD if BS are consistently lower than 80 or over 200's. Focus on nutrient dense foods and eat what you can. Medications can be adjusted. Avoid simple sugars

## 2021-01-02 ENCOUNTER — Ambulatory Visit: Payer: BC Managed Care – PPO | Admitting: "Endocrinology

## 2021-01-02 ENCOUNTER — Ambulatory Visit (INDEPENDENT_AMBULATORY_CARE_PROVIDER_SITE_OTHER): Payer: BC Managed Care – PPO | Admitting: "Endocrinology

## 2021-01-02 ENCOUNTER — Telehealth: Payer: Self-pay | Admitting: Gastroenterology

## 2021-01-02 ENCOUNTER — Encounter: Payer: Self-pay | Admitting: "Endocrinology

## 2021-01-02 VITALS — BP 137/81 | HR 76 | Ht 74.5 in | Wt 197.0 lb

## 2021-01-02 DIAGNOSIS — E039 Hypothyroidism, unspecified: Secondary | ICD-10-CM

## 2021-01-02 DIAGNOSIS — N1831 Chronic kidney disease, stage 3a: Secondary | ICD-10-CM | POA: Diagnosis not present

## 2021-01-02 DIAGNOSIS — E782 Mixed hyperlipidemia: Secondary | ICD-10-CM | POA: Diagnosis not present

## 2021-01-02 DIAGNOSIS — E1122 Type 2 diabetes mellitus with diabetic chronic kidney disease: Secondary | ICD-10-CM

## 2021-01-02 DIAGNOSIS — Z794 Long term (current) use of insulin: Secondary | ICD-10-CM | POA: Diagnosis not present

## 2021-01-02 DIAGNOSIS — F17200 Nicotine dependence, unspecified, uncomplicated: Secondary | ICD-10-CM

## 2021-01-02 LAB — POCT GLYCOSYLATED HEMOGLOBIN (HGB A1C): Hemoglobin A1C: 7.9 % — AB (ref 4.0–5.6)

## 2021-01-02 MED ORDER — INSULIN DETEMIR 100 UNIT/ML FLEXPEN: 35.0000 [IU] | PEN_INJECTOR | Freq: Every day | SUBCUTANEOUS | 0 refills | Status: DC

## 2021-01-02 MED ORDER — INSULIN LISPRO (1 UNIT DIAL) 100 UNIT/ML (KWIKPEN): 10.0000 [IU] | PEN_INJECTOR | Freq: Three times a day (TID) | SUBCUTANEOUS | 1 refills | Status: DC

## 2021-01-02 NOTE — Telephone Encounter (Signed)
Alexander Banks, patient needs follow-up in the office. Patient of Dr. [name].  Needs KUB in 2 weeks to evaluate retained biliary stent from prior ERCP unless he passes this in the interim.

## 2021-01-02 NOTE — Patient Instructions (Signed)

## 2021-01-02 NOTE — Progress Notes (Signed)
01/02/2021, 11:22 AM  Endocrinology follow-up note   Subjective:    Patient ID: Alexander Banks, male    DOB: [birth year].  [name] is being seen in follow-up after he was seen in consultation for management of currently uncontrolled symptomatic diabetes requested by  [name], MD.   Past Medical History:  Diagnosis Date   Anemia, iron deficiency    Diabetes mellitus without complication ([place])    on meds   Hearing loss    Bil hearing aids   Hyperlipidemia    Hypertension    Port-A-Cath in place 12/26/2020   Prediabetes    Sleep apnea     Past Surgical History:  Procedure Laterality Date   BILIARY STENT PLACEMENT N/A 11/09/2020   Procedure: BILIARY STENT PLACEMENT - 67 F X 5 CM STENT;  Surgeon: Rogene Houston, MD;  Location: [facility];  Service: Endoscopy;  Laterality: N/A;   BILIARY STENT PLACEMENT N/A 12/27/2020   Procedure: BILIARY STENT PLACEMENT;  Surgeon: Rogene Houston, MD;  Location: [facility];  Service: Endoscopy;  Laterality: N/A;   COLONOSCOPY     ERCP N/A 11/09/2020   Procedure: ENDOSCOPIC RETROGRADE CHOLANGIOPANCREATOGRAPHY (ERCP);  Surgeon: Rogene Houston, MD;  Location: [facility];  Service: Endoscopy;  Laterality: N/A;   ERCP N/A 12/27/2020   Procedure: ENDOSCOPIC RETROGRADE CHOLANGIOPANCREATOGRAPHY (ERCP);  Surgeon: Rogene Houston, MD;  Location: [facility];  Service: Endoscopy;  Laterality: N/A;   ESOPHAGOGASTRODUODENOSCOPY (EGD) WITH PROPOFOL N/A 11/30/2020   Procedure: ESOPHAGOGASTRODUODENOSCOPY (EGD) WITH PROPOFOL;  Surgeon: Milus Banister, MD;  Location: [facility];  Service: Endoscopy;  Laterality: N/A;   EUS N/A 11/30/2020   Procedure: UPPER ENDOSCOPIC ULTRASOUND (EUS) RADIAL;  Surgeon: Milus Banister, MD;  Location: [facility];  Service: Endoscopy;  Laterality: N/A;   FINE NEEDLE ASPIRATION N/A 11/30/2020   Procedure: FINE NEEDLE ASPIRATION (FNA) LINEAR;   Surgeon: Milus Banister, MD;  Location: [facility];  Service: Endoscopy;  Laterality: N/A;   GIVENS CAPSULE STUDY     IR IMAGING GUIDED PORT INSERTION  12/18/2020   KNEE SURGERY     4 surgeries on left, 2 on right knee   SHOULDER SURGERY     right shoulder   SPHINCTEROTOMY N/A 11/09/2020   Procedure: SHORT BILIARY SPHINCTEROTOMY;  Surgeon: Rogene Houston, MD;  Location: [facility];  Service: Endoscopy;  Laterality: N/A;    Social History   Socioeconomic History   Marital status: Married    Spouse name: Not on file   Number of children: Not on file   Years of education: Not on file   Highest education level: Not on file  Occupational History   Not on file  Tobacco Use   Smoking status: Every Day    Types: Cigars   Smokeless tobacco: Never   Tobacco comments:    Smoke 3-4 cigars a day  Vaping Use   Vaping Use: Never used  Substance and Sexual Activity   Alcohol use: Yes    Comment: occasional   Drug use: Never   Sexual activity: Not on file  Other Topics Concern  Not on file  Social History Narrative   Not on file   Social Determinants of Health   Financial Resource Strain: Low Risk    Difficulty of Paying Living Expenses: Not hard at all  Food Insecurity: No Food Insecurity   Worried About Charity fundraiser in the Last Year: Never true   Arboriculturist in the Last Year: Never true  Transportation Needs: No Transportation Needs   Lack of Transportation (Medical): No   Lack of Transportation (Non-Medical): No  Physical Activity: Sufficiently Active   Days of Exercise per Week: 5 days   Minutes of Exercise per Session: 30 min  Stress: Not on file  Social Connections: Not on file    Family History  Problem Relation Age of Onset   Heart attack Father    Heart attack Paternal Grandfather    Leukemia Sister    Heart attack Brother     Outpatient Encounter Medications as of 01/02/2021  Medication Sig   amoxicillin-clavulanate (AUGMENTIN) 875-125 MG tablet  Take 1 tablet by mouth 2 (two) times daily for 5 days.   Continuous Blood Gluc Receiver (FREESTYLE LIBRE 2 READER) DEVI As directed   Continuous Blood Gluc Sensor (FREESTYLE LIBRE 2 SENSOR) MISC 1 Piece by Does not apply route every 14 (fourteen) days.   dronabinol (MARINOL) 5 MG capsule Take 1 capsule (5 mg total) by mouth 2 (two) times daily before a meal.   Glucosamine-Chondroitin (OSTEO BI-FLEX REGULAR STRENGTH PO) Take 2 tablets by mouth daily.   insulin detemir (LEVEMIR) 100 UNIT/ML FlexPen Inject 35 Units into the skin at bedtime.   insulin lispro (HUMALOG KWIKPEN) 100 UNIT/ML KwikPen Inject 10-16 Units into the skin 3 (three) times daily before meals.   IRON-VITAMIN C PO Take 1 tablet by mouth daily.   levothyroxine (SYNTHROID) 175 MCG tablet Take 1 tablet (175 mcg total) by mouth daily before breakfast.   lidocaine-prilocaine (EMLA) cream Apply a small amount to port a cath site (do not rub in) and cover with plastic wrap 1 hour prior to infusion appointments   Melatonin-Pyridoxine (MELATIN PO) Take by mouth. Takes 2 qhs   ondansetron (ZOFRAN) 8 MG tablet Take 1 tablet (8 mg total) by mouth 2 (two) times daily as needed. Start on day 3 after chemotherapy.   OneTouch Delica Lancets 99991111 MISC TEST ONE TIME DAILY   oxyCODONE-acetaminophen (PERCOCET/ROXICET) 5-325 MG tablet Take 1 tablet by mouth every 6 (six) hours as needed for severe pain.   [DISCONTINUED] insulin detemir (LEVEMIR) 100 UNIT/ML FlexPen Inject 40 Units into the skin at bedtime.   [DISCONTINUED] insulin lispro (HUMALOG KWIKPEN) 100 UNIT/ML KwikPen Inject 14-20 Units into the skin 3 (three) times daily before meals.   No facility-administered encounter medications on file as of 01/02/2021.    ALLERGIES: Allergies  Allergen Reactions   Lisinopril Cough    VACCINATION STATUS: Immunization History  Administered Date(s) Administered   Influenza,inj,Quad PF,6+ Mos 04/15/2018, 04/09/2019   Pneumococcal Polysaccharide-23  08/10/2013   Td 06/18/2002, 11/16/2009      Hyperlipidemia This is a chronic problem. The current episode started more than 1 year ago. He has tried statins for the symptoms.  Diabetes He presents for his follow-up diabetic visit. He has type 2 diabetes mellitus. His disease course has been improving. There are no hypoglycemic associated symptoms. Pertinent negatives for hypoglycemia include no confusion, pallor or seizures. Associated symptoms include polydipsia and polyuria. Pertinent negatives for diabetes include no polyphagia. There are no hypoglycemic complications. Risk  factors for coronary artery disease include diabetes mellitus, dyslipidemia and male sex. Current diabetic treatment includes insulin injections. He has had a previous visit with a dietitian. His home blood glucose trend is decreasing steadily. His breakfast blood glucose range is generally 140-180 mg/dl. His lunch blood glucose range is generally 140-180 mg/dl. His dinner blood glucose range is generally 140-180 mg/dl. His bedtime blood glucose range is generally 140-180 mg/dl. His overall blood glucose range is 140-180 mg/dl. (He was started on multiple daily injections of insulin during his last visit.  He presents with his CGM device showing 73% time range, 22% slightly above range.  No significant hypoglycemia.  His point-of-care A1c 7.9% improving from 12.8%.)    Review of Systems  Constitutional:  Negative for unexpected weight change.  HENT:  Negative for dental problem, mouth sores and trouble swallowing.   Eyes:  Negative for visual disturbance.  Respiratory:  Negative for choking, chest tightness, shortness of breath and wheezing.   Cardiovascular:  Negative for palpitations and leg swelling.  Gastrointestinal:  Negative for abdominal distention, constipation and diarrhea.  Endocrine: Positive for polydipsia and polyuria. Negative for polyphagia.  Genitourinary:  Negative for dysuria, flank pain, hematuria and  urgency.  Musculoskeletal:  Negative for back pain and gait problem.  Skin:  Negative for pallor and wound.  Neurological:  Negative for seizures and syncope.  Psychiatric/Behavioral:  Negative for confusion and dysphoric mood.    Objective:    Vitals with BMI 01/02/2021 01/01/2021 12/30/2020  Height 6' 2.5" 6' 2.5" -  Weight 197 lbs 199 lbs -  BMI [id number] 0000000 -  Systolic 0000000 - 99991111  Diastolic 81 - 95  Pulse 76 - 73    BP 137/81   Pulse 76   Ht 6' 2.5" (1.892 m)   Wt 197 lb (89.4 kg)   BMI 24.95 kg/m   Wt Readings from Last 3 Encounters:  01/02/21 197 lb (89.4 kg)  01/01/21 199 lb (90.3 kg)  12/27/20 189 lb (85.7 kg)     Physical Exam Constitutional:      General: He is not in acute distress.    Appearance: He is well-developed.  HENT:     Head: Normocephalic and atraumatic.  Neck:     Thyroid: No thyromegaly.     Trachea: No tracheal deviation.  Cardiovascular:     Rate and Rhythm: Normal rate.     Pulses:          Dorsalis pedis pulses are 1+ on the right side and 1+ on the left side.       Posterior tibial pulses are 1+ on the right side and 1+ on the left side.     Heart sounds: S1 normal and S2 normal. No murmur heard.   No gallop.  Pulmonary:     Effort: Pulmonary effort is normal. No respiratory distress.     Breath sounds: No wheezing.  Abdominal:     General: There is no distension.     Tenderness: There is no abdominal tenderness. There is no guarding.  Musculoskeletal:     Right shoulder: No swelling or deformity.     Cervical back: Normal range of motion and neck supple.  Skin:    General: Skin is warm and dry.     Findings: No rash.     Nails: There is no clubbing.  Neurological:     Mental Status: He is alert and oriented to person, place, and time.     Cranial Nerves: No  cranial nerve deficit.     Sensory: No sensory deficit.     Gait: Gait normal.     Deep Tendon Reflexes: Reflexes are normal and symmetric.  Psychiatric:        Speech:  Speech normal.        Behavior: Behavior normal. Behavior is cooperative.        Thought Content: Thought content normal.        Judgment: Judgment normal.    CMP ( most recent) CMP     Component Value Date/Time   NA 136 12/30/2020 0537   NA 143 12/22/2020 0808   K 4.5 12/30/2020 0537   CL 102 12/30/2020 0537   CO2 29 12/30/2020 0537   GLUCOSE 216 (H) 12/30/2020 0537   BUN 15 12/30/2020 0537   BUN 14 12/22/2020 0808   CREATININE 0.88 12/30/2020 0537   CREATININE 1.10 05/31/2013 1020   CALCIUM 8.4 (L) 12/30/2020 0537   PROT 5.6 (L) 12/30/2020 0537   PROT 6.4 12/22/2020 0808   ALBUMIN 2.3 (L) 12/30/2020 0537   ALBUMIN 3.6 (L) 12/22/2020 0808   AST 21 12/30/2020 0537   ALT 54 (H) 12/30/2020 0537   ALKPHOS 236 (H) 12/30/2020 0537   BILITOT 1.2 12/30/2020 0537   BILITOT 0.2 12/22/2020 0808   GFRNONAA >60 12/30/2020 0537   GFRAA 87 06/02/2020 0812     Diabetic Labs (most recent): Lab Results  Component Value Date   HGBA1C 7.9 (A) 01/02/2021   HGBA1C 12.8 (H) 11/04/2020   HGBA1C 12.5 (H) 11/03/2020     Lipid Panel ( most recent) Lipid Panel     Component Value Date/Time   CHOL 117 06/02/2020 0812   TRIG 182 (H) 06/02/2020 0812   HDL 36 (L) 06/02/2020 0812   CHOLHDL 3.3 06/02/2020 0812   CHOLHDL 3.5 05/31/2013 1020   VLDL 23 05/31/2013 1020   LDLCALC 51 06/02/2020 0812   LABVLDL 30 06/02/2020 0812      Lab Results  Component Value Date   TSH 3.350 12/22/2020   TSH 0.529 11/03/2020   TSH 0.438 (L) 06/02/2020   TSH 1.070 11/24/2019   TSH 5.940 (H) 04/05/2019   TSH 0.225 (L) 10/07/2018   TSH 1.210 04/08/2018   TSH 0.344 (L) 07/17/2017   TSH 0.608 12/09/2016   TSH 0.927 05/31/2016   FREET4 1.22 12/22/2020   FREET4 1.65 12/09/2016   FREET4 0.92 11/28/2014      Assessment & Plan:   1. Type 2 diabetes mellitus with stage 3a chronic kidney disease, with long-term current use of insulin ([place])   - Kartier DIXIE SCHNIPKE has currently uncontrolled symptomatic  type 2 DM since  65 years of age.  He was started on multiple daily injections of insulin during his last visit.  He presents with his CGM device showing 73% time range, 22% slightly above range.  No significant hypoglycemia.  His point-of-care A1c 7.9% improving from 12.8%.  -His interval developments were reviewed.  He was diagnosed with pancreatic cancer, currently being prepared for rounds of chemotherapy to be followed by surgery.   - I had a long discussion with him about the progressive nature of diabetes and the pathology behind its complications. He will need further study to classify his diabetes properly.  He will have an Tylosin antibodies and antiglutamic acid decarboxylase antibodies measurements. -his diabetes is complicated by recent severe hyperglycemia, DKA and he remains at a high risk for more acute and chronic complications which include CAD, CVA, CKD, retinopathy, and  neuropathy. These are all discussed in detail with him.  - I have counseled him on diet  and weight management  by adopting a carbohydrate restricted/protein rich diet. Patient is encouraged to switch to  unprocessed or minimally processed     complex starch and increased protein intake (animal or plant source), fruits, and vegetables. -  he is advised to stick to a routine mealtimes to eat 3 meals  a day and avoid unnecessary snacks ( to snack only to correct hypoglycemia).   - he acknowledges that there is a room for improvement in his food and drink choices. - Suggestion is made for him to avoid simple carbohydrates  from his diet including Cakes, Sweet Desserts, Ice Cream, Soda (diet and regular), Sweet Tea, Candies, Chips, Cookies, Store Bought Juices, Alcohol in Excess of  1-2 drinks a day, Artificial Sweeteners,  Coffee Creamer, and "Sugar-free" Products, Lemonade. This will help patient to have more stable blood glucose profile and potentially avoid unintended weight gain.   - he will be scheduled with  Jearld Fenton, RDN, CDE for diabetes education.  He has seen her before, lost contact for the last 3 years.  - I have approached him with the following individualized plan to manage  his diabetes and patient agrees:   -In light of his ongoing diagnosis and treatment for pancreatic cancer, he will be kept away from oral medications or GLP-1 receptor agonist.  He will continue on intensive basal/bolus insulin in order for him to achieve and maintain control of diabetes to target.    -His advised to stay on lower dose of Levemir 35 units nightly, lower Humalog to 10   units 3 times a day with meals  for pre-meal BG readings of 90-'150mg'$ /dl, plus patient specific correction dose for unexpected hyperglycemia above '150mg'$ /dl, associated with strict monitoring of glucose 4 times a day-before meals and at bedtime. - he is warned not to take insulin without proper monitoring per orders. - Adjustment parameters are given to him for hypo and hyperglycemia in writing. - he is encouraged to call clinic for blood glucose levels less than 70 or above 300 mg /dl. -He is benefiting from his CGM, advised to continue to wear it at all times.   - Specific targets for  A1c;  LDL, HDL,  and Triglycerides were discussed with the patient.  2) Blood Pressure /Hypertension:   His blood pressure is controlled to target.    3) Lipids/Hyperlipidemia:   Review of his recent lipid panel showed uncontrolled triglyceride at 182, controlled LDL at 51.  He is currently on Crestor 40 mg p.o. daily.  He is advised to continue on same.    4)  Weight/Diet:  Body mass index is 24.95 kg/m.  -This patient weighed as high as 285 in the past.  Over the years he lost weight, initially intentionally, later unintentionally.  He has history of moderate alcohol consumption for decades.  He is not suspect for malabsorption at this time.  After his pancreatectomy, he may need Creon intervention to help with digestion and absorption.     Exercise, and detailed carbohydrates information provided  -  detailed on discharge instructions.  5) hypothyroidism: Circumstance of diagnosis is not available.  He denies any surgery or thyroid ablation.  He has taken thyroid hormone for the last 2 years.  His previsit labs confirm Hashimoto's thyroiditis as a cause of hypothyroidism.  His TSH and free T4 are consistent with appropriate replacement.  He is advised to  continue levothyroxine 175 mcg p.o. daily before breakfast.    - We discussed about the correct intake of his thyroid hormone, on empty stomach at fasting, with water, separated by at least 30 minutes from breakfast and other medications,  and separated by more than 4 hours from calcium, iron, multivitamins, acid reflux medications (PPIs). -Patient is made aware of the fact that thyroid hormone replacement is needed for life, dose to be adjusted by periodic monitoring of thyroid function tests.  6) Chronic Care/Health Maintenance:  -he  is on Statin medications and  is encouraged to initiate and continue to follow up with Ophthalmology, Dentist,  Podiatrist at least yearly or according to recommendations, and advised to  quit smoking. I have recommended yearly flu vaccine and pneumonia vaccine at least every 5 years; moderate intensity exercise for up to 150 minutes weekly; and  sleep for at least 7 hours a day.  He is encouraged to continue follow-up with his oncology team regarding a recent diagnosis of pancreatic cancer.     The patient was counseled on the dangers of tobacco use, and was advised to quit.  Reviewed strategies to maximize success, including removing cigarettes and smoking materials from environment.   - he is  advised to maintain close follow up with [name], MD for primary care needs, as well as his other providers for optimal and coordinated care.   I spent 41 minutes in the care of the patient today including review of labs from [facility], Lipids, Thyroid  Function, Hematology (current and previous including abstractions from other facilities); face-to-face time discussing  his blood glucose readings/logs, discussing hypoglycemia and hyperglycemia episodes and symptoms, medications doses, his options of short and long term treatment based on the latest standards of care / guidelines;  discussion about incorporating lifestyle medicine;  and documenting the encounter.    Please refer to Patient Instructions for Blood Glucose Monitoring and Insulin/Medications Dosing Guide"  in media tab for additional information. Please  also refer to " Patient Self Inventory" in the Media  tab for reviewed elements of pertinent patient history.  [name] participated in the discussions, expressed understanding, and voiced agreement with the above plans.  All questions were answered to his satisfaction. he is encouraged to contact clinic should he have any questions or concerns prior to his return visit.   Follow up plan: - Return in about 3 months (around 04/05/2021) for Bring Meter and Logs- A1c in Office.  Glade Lloyd, MD [facility] [address] [place], [place] [address] Phone: [phone]  Fax: [phone]    01/02/2021, 11:22 AM  This note was partially dictated with voice recognition software. Similar sounding words can be transcribed inadequately or may not  be corrected upon review.

## 2021-01-03 ENCOUNTER — Other Ambulatory Visit: Payer: Self-pay

## 2021-01-03 NOTE — Progress Notes (Signed)
The proposed treatment discussed in conference is for discussion purpose only and is not a binding recommendation.  The patients have not been physically examined, or presented with their treatment options.  Therefore, final treatment plans cannot be decided.  

## 2021-01-05 ENCOUNTER — Other Ambulatory Visit: Payer: Self-pay

## 2021-01-05 ENCOUNTER — Ambulatory Visit (INDEPENDENT_AMBULATORY_CARE_PROVIDER_SITE_OTHER): Payer: BC Managed Care – PPO | Admitting: Family Medicine

## 2021-01-05 VITALS — BP 112/65 | Ht 74.5 in | Wt 197.2 lb

## 2021-01-05 DIAGNOSIS — E1169 Type 2 diabetes mellitus with other specified complication: Secondary | ICD-10-CM

## 2021-01-05 DIAGNOSIS — R748 Abnormal levels of other serum enzymes: Secondary | ICD-10-CM

## 2021-01-05 DIAGNOSIS — D490 Neoplasm of unspecified behavior of digestive system: Secondary | ICD-10-CM | POA: Diagnosis not present

## 2021-01-05 DIAGNOSIS — T859XXA Unspecified complication of internal prosthetic device, implant and graft, initial encounter: Secondary | ICD-10-CM

## 2021-01-05 DIAGNOSIS — E785 Hyperlipidemia, unspecified: Secondary | ICD-10-CM

## 2021-01-05 NOTE — Patient Instructions (Signed)
Do plain xray next Tuesday late afternoon or Weds Aug 24th

## 2021-01-05 NOTE — Progress Notes (Signed)
   Subjective:    Patient ID: Alexander Banks, male    DOB: 1956/02/12, 65 y.o.   MRN: [id number]  HPI Patient arrives for a [facility] hospital follow up. See TOC note. Patient was in the hospital because the stent came out had cholangitis.  Was treated with antibiotics.  A new stent was placed.  His numbers were looking better.  He is doing the best he can and eating and trying to keep his weight Denies fever chills abdominal pain Understands his medications Cholesterol medicine was stopped because of elevated liver enzymes Blood pressure has been hovering on the lower end Denies dizziness passing out Has a pancreatic tumor will be starting chemotherapy next week  Review of Systems     Objective:   Physical Exam General-in no acute distress Eyes-no discharge Lungs-respiratory rate normal, CTA CV-no murmurs,RRR Extremities skin warm dry no edema Neuro grossly normal Behavior normal, alert  Abdomen is soft no guarding rebound or tenderness No jaundice is noted     Assessment & Plan:  Patient doing well Pancreatic tumor Chemo starts next week Does need to have follow-up lab work patient request to have that done next week when he gets his port attended to  He does need a follow-up KUB to make sure that the stent has passed-he will do that next week we will include Dr. Dereck Leep with the result of that  If it does not pass they may need to go retrieve it  Patient's blood pressure 104/60 sitting 92/60 standing I encouraged him to go ahead with increased intake of salt to help his blood pressure stay up  Hold on cholesterol medicine until we see the results of his lab work from next week  Will send message to [name] regarding follow-up labs

## 2021-01-08 ENCOUNTER — Telehealth: Payer: Self-pay | Admitting: Nurse Practitioner

## 2021-01-08 NOTE — Telephone Encounter (Signed)
Open in error

## 2021-01-09 ENCOUNTER — Other Ambulatory Visit: Payer: Self-pay

## 2021-01-09 ENCOUNTER — Ambulatory Visit (HOSPITAL_COMMUNITY)
Admission: RE | Admit: 2021-01-09 | Discharge: 2021-01-09 | Disposition: A | Discharge status: Home / Self Care | Payer: BC Managed Care – PPO | Source: Ambulatory Visit | Attending: Family Medicine | Admitting: Family Medicine

## 2021-01-09 DIAGNOSIS — Z9689 Presence of other specified functional implants: Secondary | ICD-10-CM | POA: Diagnosis not present

## 2021-01-09 DIAGNOSIS — M47816 Spondylosis without myelopathy or radiculopathy, lumbar region: Secondary | ICD-10-CM | POA: Diagnosis not present

## 2021-01-09 DIAGNOSIS — T859XXA Unspecified complication of internal prosthetic device, implant and graft, initial encounter: Secondary | ICD-10-CM | POA: Insufficient documentation

## 2021-01-09 DIAGNOSIS — D490 Neoplasm of unspecified behavior of digestive system: Secondary | ICD-10-CM | POA: Insufficient documentation

## 2021-01-09 IMAGING — DX DG ABDOMEN 1V
1 series · 1 of 1 positions shown · non-contrast
Comparison: [DATE]

CLINICAL DATA: Biliary stent placement

EXAM:
ABDOMEN - 1 VIEW

[abdomen kub]
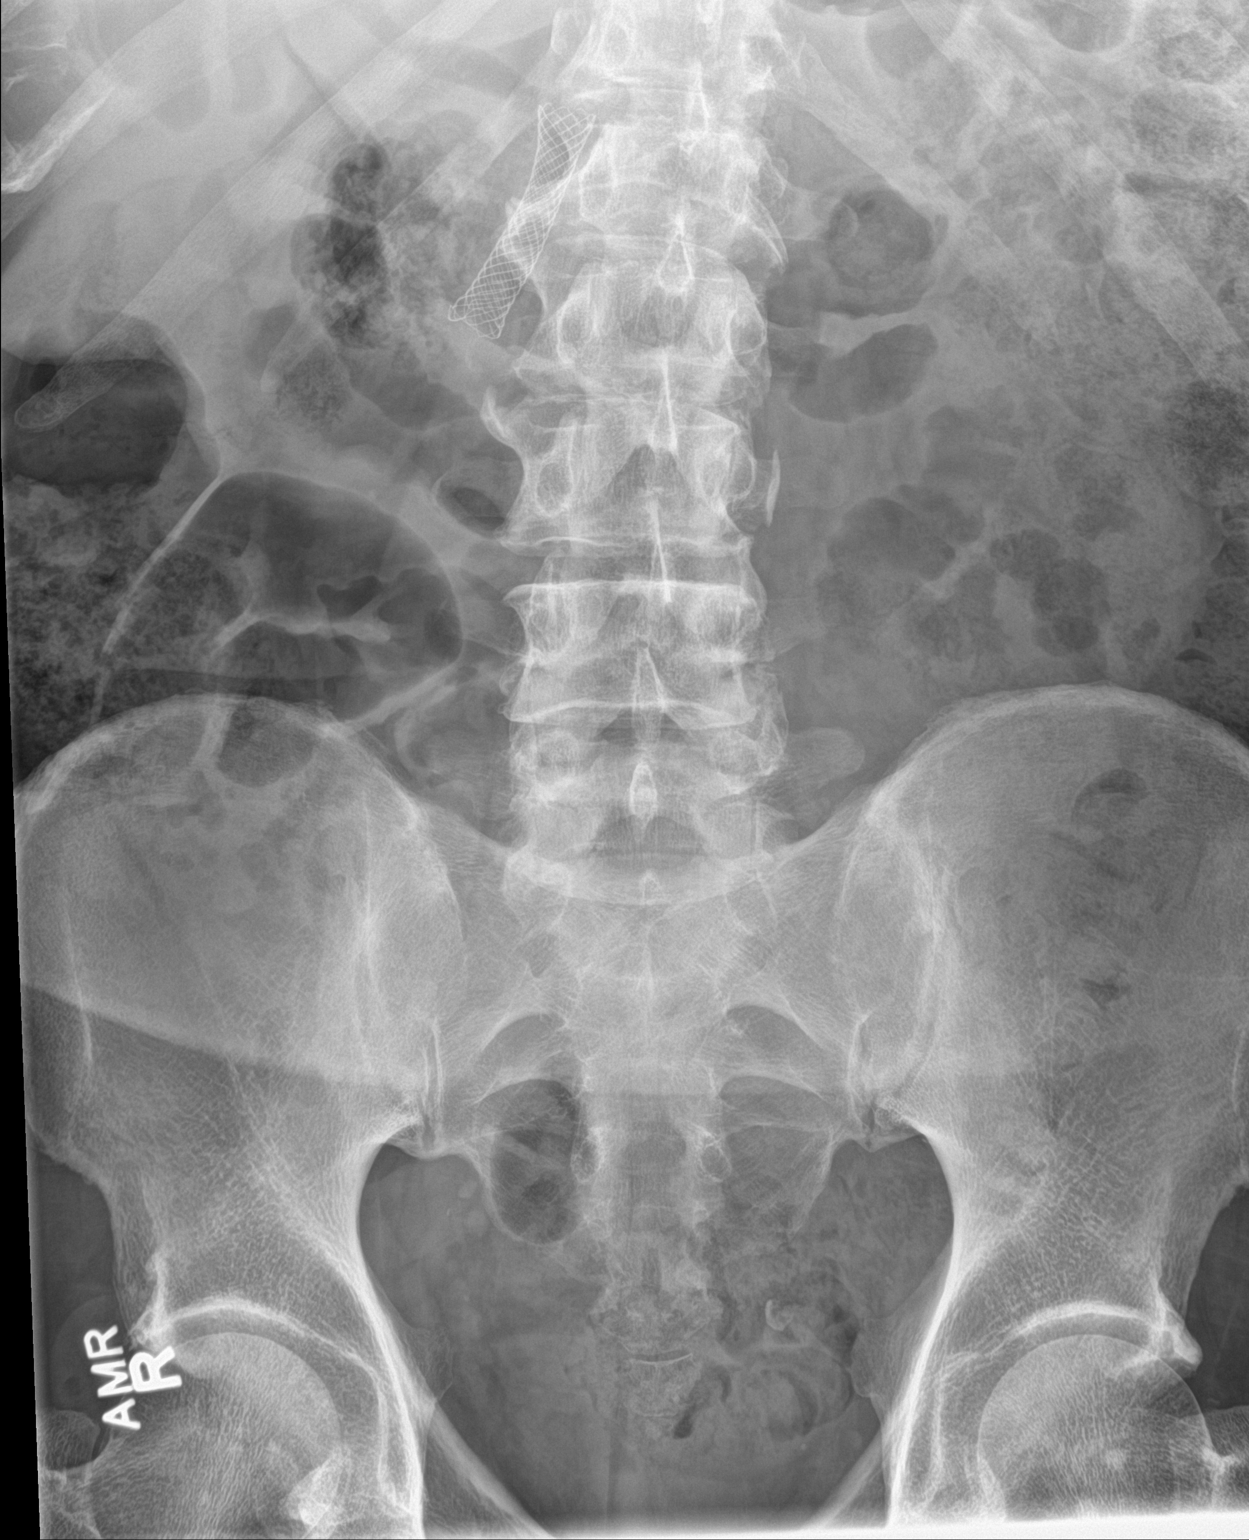

[1 of 1 positions shown; findings below may reference images not displayed]

FINDINGS: Metallic internal biliary stent overlies its expected position
within the right paraspinal region and demonstrates interval
expansion since prior examination. Previously noted silastic
internal biliary stent likely within the enteric tract is no longer
visualized and high Ki has passed in the interval. Normal abdominal
gas pattern. Degenerative changes are seen within the lumbar spine.
Vascular calcifications noted within the pelvis.
IMPRESSION: Metallic biliary stent in expected position with interval expansion
to near nominal diameter since prior examination.

## 2021-01-09 NOTE — Progress Notes (Signed)
[place], [place] [address]   CLINIC:  Medical Oncology/Hematology  PCP:  [name], MD [address] [place] / [place] [address] [phone]   REASON FOR VISIT:  Follow-up for  pancreatic adenocarcinoma  PRIOR THERAPY: none  NGS Results: not done  CURRENT THERAPY: FOLFIRINOX every 2 weeks x 4 cycles  BRIEF ONCOLOGIC HISTORY:  Oncology History  Pancreatic adenocarcinoma ([place])  12/04/2020 Initial Diagnosis   Pancreatic adenocarcinoma ([place])   01/10/2021 -  Chemotherapy    Patient is on Treatment Plan: PANCREAS MODIFIED FOLFIRINOX Q14D X 4 CYCLES         CANCER STAGING: Cancer Staging Pancreatic adenocarcinoma ([place]) Staging form: Exocrine Pancreas, AJCC 8th Edition - Clinical stage from 12/04/2020: Stage IB (cT2, cN0, cM0) - Unsigned   INTERVAL HISTORY:  [name], a 65 y.o. male, returns for routine follow-up and consideration for next cycle of chemotherapy. Alexander Banks was last seen on 12/20/20.  Due for cycle #1 of FOLFIRINOX today.   Overall, he tells me he has been feeling pretty well. He denies tingling/numbness, diarrhea, constipation, fevers, chills, and loss of appetite. He reports occasional spasms and pain in his abdomen. He is not currently drinking any Boost/Ensure. At his baseline he has 1-2 well formed BM daily.   Overall, he feels ready for next cycle of chemo today.   REVIEW OF SYSTEMS:  Review of Systems  Constitutional:  Positive for fatigue (50%). Negative for appetite change (50%), chills and fever.  Gastrointestinal:  Positive for abdominal pain. Negative for constipation and diarrhea.  Genitourinary:  Positive for frequency.   Musculoskeletal:  Positive for back pain (4/10 upper).  Neurological:  Negative for numbness.  All other systems reviewed and are negative.  PAST MEDICAL/SURGICAL HISTORY:  Past Medical History:  Diagnosis Date   Anemia, iron deficiency    Diabetes  mellitus without complication ([place])    on meds   Hearing loss    Bil hearing aids   Hyperlipidemia    Hypertension    Port-A-Cath in place 12/26/2020   Prediabetes    Sleep apnea    Past Surgical History:  Procedure Laterality Date   BILIARY STENT PLACEMENT N/A 11/09/2020   Procedure: BILIARY STENT PLACEMENT - 80 F X 5 CM STENT;  Surgeon: Rogene Houston, MD;  Location: [facility];  Service: Endoscopy;  Laterality: N/A;   BILIARY STENT PLACEMENT N/A 12/27/2020   Procedure: BILIARY STENT PLACEMENT;  Surgeon: Rogene Houston, MD;  Location: [facility];  Service: Endoscopy;  Laterality: N/A;   COLONOSCOPY     ERCP N/A 11/09/2020   Procedure: ENDOSCOPIC RETROGRADE CHOLANGIOPANCREATOGRAPHY (ERCP);  Surgeon: Rogene Houston, MD;  Location: [facility];  Service: Endoscopy;  Laterality: N/A;   ERCP N/A 12/27/2020   Procedure: ENDOSCOPIC RETROGRADE CHOLANGIOPANCREATOGRAPHY (ERCP);  Surgeon: Rogene Houston, MD;  Location: [facility];  Service: Endoscopy;  Laterality: N/A;   ESOPHAGOGASTRODUODENOSCOPY (EGD) WITH PROPOFOL N/A 11/30/2020   Procedure: ESOPHAGOGASTRODUODENOSCOPY (EGD) WITH PROPOFOL;  Surgeon: Milus Banister, MD;  Location: [facility];  Service: Endoscopy;  Laterality: N/A;   EUS N/A 11/30/2020   Procedure: UPPER ENDOSCOPIC ULTRASOUND (EUS) RADIAL;  Surgeon: Milus Banister, MD;  Location: [facility];  Service: Endoscopy;  Laterality: N/A;   FINE NEEDLE ASPIRATION N/A 11/30/2020   Procedure: FINE NEEDLE ASPIRATION (FNA) LINEAR;  Surgeon: Milus Banister, MD;  Location: [facility];  Service: Endoscopy;  Laterality: N/A;   GIVENS CAPSULE STUDY  IR IMAGING GUIDED PORT INSERTION  12/18/2020   KNEE SURGERY     4 surgeries on left, 2 on right knee   SHOULDER SURGERY     right shoulder   SPHINCTEROTOMY N/A 11/09/2020   Procedure: SHORT BILIARY SPHINCTEROTOMY;  Surgeon: Rogene Houston, MD;  Location: [facility];  Service: Endoscopy;  Laterality: N/A;    SOCIAL HISTORY:  Social History    Socioeconomic History   Marital status: Married    Spouse name: Not on file   Number of children: Not on file   Years of education: Not on file   Highest education level: Not on file  Occupational History   Not on file  Tobacco Use   Smoking status: Every Day    Types: Cigars   Smokeless tobacco: Never   Tobacco comments:    Smoke 3-4 cigars a day  Vaping Use   Vaping Use: Never used  Substance and Sexual Activity   Alcohol use: Yes    Comment: occasional   Drug use: Never   Sexual activity: Not on file  Other Topics Concern   Not on file  Social History Narrative   Not on file   Social Determinants of Health   Financial Resource Strain: Low Risk    Difficulty of Paying Living Expenses: Not hard at all  Food Insecurity: No Food Insecurity   Worried About Charity fundraiser in the Last Year: Never true   [place] in the Last Year: Never true  Transportation Needs: No Transportation Needs   Lack of Transportation (Medical): No   Lack of Transportation (Non-Medical): No  Physical Activity: Sufficiently Active   Days of Exercise per Week: 5 days   Minutes of Exercise per Session: 30 min  Stress: Not on file  Social Connections: Not on file  Intimate Partner Violence: Not At Risk   Fear of Current or Ex-Partner: No   Emotionally Abused: No   Physically Abused: No   Sexually Abused: No    FAMILY HISTORY:  Family History  Problem Relation Age of Onset   Heart attack Father    Heart attack Paternal Grandfather    Leukemia Sister    Heart attack Brother     CURRENT MEDICATIONS:  Current Outpatient Medications  Medication Sig Dispense Refill   Continuous Blood Gluc Receiver (FREESTYLE LIBRE 2 READER) DEVI As directed 1 each 0   Continuous Blood Gluc Sensor (FREESTYLE LIBRE 2 SENSOR) MISC 1 Piece by Does not apply route every 14 (fourteen) days. 6 each 2   dronabinol (MARINOL) 5 MG capsule Take 1 capsule (5 mg total) by mouth 2 (two) times daily before a  meal. 60 capsule 2   Glucosamine-Chondroitin (OSTEO BI-FLEX REGULAR STRENGTH PO) Take 2 tablets by mouth daily.     insulin detemir (LEVEMIR) 100 UNIT/ML FlexPen Inject 35 Units into the skin at bedtime. 45 mL 0   insulin lispro (HUMALOG KWIKPEN) 100 UNIT/ML KwikPen Inject 10-16 Units into the skin 3 (three) times daily before meals. 45 mL 1   IRON-VITAMIN C PO Take 1 tablet by mouth daily.     levothyroxine (SYNTHROID) 175 MCG tablet Take 1 tablet (175 mcg total) by mouth daily before breakfast.     lidocaine-prilocaine (EMLA) cream Apply a small amount to port a cath site (do not rub in) and cover with plastic wrap 1 hour prior to infusion appointments 30 g 3   Melatonin-Pyridoxine (MELATIN PO) Take by mouth. Takes 2 qhs  ondansetron (ZOFRAN) 8 MG tablet Take 1 tablet (8 mg total) by mouth 2 (two) times daily as needed. Start on day 3 after chemotherapy. 30 tablet 1   OneTouch Delica Lancets 99991111 MISC TEST ONE TIME DAILY 100 each 11   oxyCODONE-acetaminophen (PERCOCET/ROXICET) 5-325 MG tablet Take 1 tablet by mouth every 6 (six) hours as needed for severe pain.     No current facility-administered medications for this visit.    ALLERGIES:  Allergies  Allergen Reactions   Lisinopril Cough    PHYSICAL EXAM:  Performance status (ECOG): 0 - Asymptomatic  There were no vitals filed for this visit. Wt Readings from Last 3 Encounters:  01/05/21 197 lb 3.2 oz (89.4 kg)  01/02/21 197 lb (89.4 kg)  01/01/21 199 lb (90.3 kg)   Physical Exam Vitals reviewed.  Constitutional:      Appearance: Normal appearance.  Cardiovascular:     Rate and Rhythm: Normal rate and regular rhythm.     Pulses: Normal pulses.     Heart sounds: Normal heart sounds.  Pulmonary:     Effort: Pulmonary effort is normal.     Breath sounds: Normal breath sounds.  Abdominal:     Palpations: Abdomen is soft. There is no hepatomegaly, splenomegaly or mass.     Tenderness: There is no abdominal tenderness.   Neurological:     General: No focal deficit present.     Mental Status: He is alert and oriented to person, place, and time.  Psychiatric:        Mood and Affect: Mood normal.        Behavior: Behavior normal.    LABORATORY DATA:  I have reviewed the labs as listed.  CBC Latest Ref Rng & Units 12/29/2020 12/28/2020 12/27/2020  WBC 4.0 - 10.5 K/uL 11.6(H) 19.5(H) 13.5(H)  Hemoglobin 13.0 - 17.0 g/dL 10.9(L) 11.8(L) 13.9  Hematocrit 39.0 - 52.0 % 33.6(L) 37.5(L) 42.6  Platelets 150 - 400 K/uL 160 177 248   CMP Latest Ref Rng & Units 12/30/2020 12/29/2020 12/28/2020  Glucose 70 - 99 mg/dL 216(H) 189(H) 198(H)  BUN 8 - 23 mg/dL 15 24(H) 30(H)  Creatinine 0.61 - 1.24 mg/dL 0.88 0.95 1.11  Sodium 135 - 145 mmol/L 136 136 137  Potassium 3.5 - 5.1 mmol/L 4.5 3.9 4.2  Chloride 98 - 111 mmol/L 102 106 107  CO2 22 - 32 mmol/L '29 25 22  '$ Calcium 8.9 - 10.3 mg/dL 8.4(L) 7.8(L) 7.8(L)  Total Protein 6.5 - 8.1 g/dL 5.6(L) 4.9(L) 5.2(L)  Total Bilirubin 0.3 - 1.2 mg/dL 1.2 1.2 3.3(H)  Alkaline Phos 38 - 126 U/L 236(H) 220(H) 224(H)  AST 15 - 41 U/L 21 39 83(H)  ALT 0 - 44 U/L 54(H) 71(H) 105(H)    DIAGNOSTIC IMAGING:  I have independently reviewed the scans and discussed with the patient. X-ray abdomen [facility]  Result Date: 12/27/2020 CLINICAL DATA:  Pancreatic cancer post stent replacement. The original stent could not be located during ERCP. EXAM: ABDOMEN - 1 VIEW COMPARISON:  CT 12/05/2020, PET-CT 12/14/2020 and images from ERCP done earlier today. FINDINGS: New metallic biliary stent in place with waisting in its mid section. The preexistent plastic biliary stent overlaps the left paraspinal region, displaced from its location on recent CT and PET, presumably intraluminal within the proximal small bowel. No supine evidence of free intraperitoneal air or bowel obstruction. Mild degenerative changes are present within the lumbar spine. IMPRESSION: 1. The plastic biliary stent is displaced from its recent  location, overlapping  the left paraspinal region, presumably within the proximal small bowel. 2. New biliary stent appears satisfactorily positioned. Electronically Signed   By: Richardean Sale M.D.   On: 12/27/2020 15:41   NM PET Image Initial (PI) Skull Base To Thigh  Result Date: 12/16/2020 CLINICAL DATA:  Initial treatment strategy for pancreatic cancer staging in a 65 year old male. EXAM: NUCLEAR MEDICINE PET SKULL BASE TO THIGH TECHNIQUE: 9.52 mCi F-18 FDG was injected intravenously. Full-ring PET imaging was performed from the skull base to thigh after the radiotracer. CT data was obtained and used for attenuation correction and anatomic localization. Fasting blood glucose: 130 for mg/dl COMPARISON:  CT of the abdomen and pelvis of December 05, 2020. FINDINGS: Mediastinal blood pool activity: SUV max 2.12 Liver activity: SUV max NA NECK: No hypermetabolic lymph nodes in the neck. Incidental CT findings: none CHEST: No hypermetabolic mediastinal or hilar nodes. No suspicious pulmonary nodules on the CT scan. Incidental CT findings: Aortic atherosclerosis and 3 vessel coronary artery disease. Heart size normal without pericardial effusion. Esophagus grossly normal. Lungs are clear. Airways are patent. ABDOMEN/PELVIS: Hypermetabolic mass in the pancreatic head with a maximum SUV of 5.3. Stent traverses the common bile duct in the intrapancreatic portion as on the recent CT evaluation. Findings of note from recent CT include replaced RIGHT hepatic artery arising from the SMA anterior drop shape of SMV. Small lymph nodes in the area without significant metabolic activity. No sign of focal, suspicious hepatic lesion or focal metabolic activity in the liver. No solid organ uptake or nodal disease. Incidental CT findings: Cholelithiasis with distended gallbladder. Pneumobilia biliary stent in place traversing pancreatic portion of common bile duct and the site of pancreatic mass. Peripheral pancreatic ductal dilation  with mild peripancreatic stranding which is similar to previous imaging. Normal appearance of the spleen, adrenal glands and kidneys. Decompressed urinary bladder. Small hiatal hernia. Small bowel is normal caliber. No acute gastrointestinal process with colonic diverticulosis. Aortic atherosclerosis without signs of aneurysm. Scattered small retroperitoneal lymph nodes including in ovoid intra-aortocaval lymph node measuring 9 mm (image 188/3 maximum SUV of 2.1) SKELETON: No focal hypermetabolic activity to suggest skeletal metastasis. Incidental CT findings: Spinal degenerative changes. No acute or destructive bone findings. IMPRESSION: Mass in the pancreatic head with hypermetabolic features, no current signs of metastatic disease. Vascular findings of potential significance: -replaced RIGHT hepatic arterial supply arises from the SMA. -teardrop morphology of the SMV. Intra-aortocaval lymph node with mildly rounded morphology but without increased metabolic activity on FDG PET, attention on follow-up. Cholelithiasis. Aortic Atherosclerosis (ICD10-I70.0). Electronically Signed   By: Zetta Bills M.D.   On: 12/16/2020 08:38   DG Chest Port 1 View  Result Date: 12/27/2020 CLINICAL DATA:  Fever. EXAM: PORTABLE CHEST 1 VIEW COMPARISON:  March 13, 2007. FINDINGS: The heart size and mediastinal contours are within normal limits. Both lungs are clear. Right internal jugular Port-A-Cath is noted with distal tip in expected position of the SVC. The visualized skeletal structures are unremarkable. IMPRESSION: No active disease. Electronically Signed   By: Marijo Conception M.D.   On: 12/27/2020 12:14   DG ERCP  Result Date: 12/27/2020 CLINICAL DATA:  ERCP with stent replacement.  Pancreatic cancer. EXAM: ERCP TECHNIQUE: Multiple spot images obtained with the fluoroscopic device and submitted for interpretation post-procedure. FLUOROSCOPY TIME:  Fluoroscopy Time:  2 minutes and 16 seconds Radiation Exposure Index  (if provided by the fluoroscopic device): 46.4 mGy Number of Acquired Spot Images: 0 COMPARISON:  Abdominal CT 12/05/2020 and PET-CT  12/14/2020. FINDINGS: The submitted images demonstrate moderate dilatation of the common hepatic duct and mild intrahepatic biliary dilatation. There is mild wall irregularity of the common hepatic duct, but no discrete stones are seen. There is high-grade focal narrowing of the common bile duct at the level of the pancreatic head. Final images demonstrate the placement of a metallic biliary stent across the common bile duct with waisting within the pancreatic head. There is decompression of the biliary system on the final image. The plastic biliary stent is visible on some of the images overlapping the spine, better seen on the postoperative abdominal radiograph. IMPRESSION: Images during biliary stent exchange for pancreatic cancer as described. The known displaced plastic biliary stent is partially visualized, better seen on postoperative radiographs. These images were submitted for radiologic interpretation only. Please see the procedural report for the amount of contrast and the fluoroscopy time utilized. Electronically Signed   By: Richardean Sale M.D.   On: 12/27/2020 15:49   IR IMAGING GUIDED PORT INSERTION  Result Date: 12/18/2020 INDICATION: 65 year old male with history of pancreatic cancer requiring central venous access for chemotherapy. EXAM: IMPLANTED PORT A CATH PLACEMENT WITH ULTRASOUND AND FLUOROSCOPIC GUIDANCE COMPARISON:  None. MEDICATIONS: None. ANESTHESIA/SEDATION: Moderate (conscious) sedation was employed during this procedure. A total of Versed 1 mg and Fentanyl 100 mcg was administered intravenously. Moderate Sedation Time: 21 minutes. The patient's level of consciousness and vital signs were monitored continuously by radiology nursing throughout the procedure under my direct supervision. CONTRAST:  None FLUOROSCOPY TIME:  0 minutes, 6 seconds (2 mGy)  COMPLICATIONS: None immediate. PROCEDURE: The procedure, risks, benefits, and alternatives were explained to the patient. Questions regarding the procedure were encouraged and answered. The patient understands and consents to the procedure. The right neck and chest were prepped with chlorhexidine in a sterile fashion, and a sterile drape was applied covering the operative field. Maximum barrier sterile technique with sterile gowns and gloves were used for the procedure. A timeout was performed prior to the initiation of the procedure. Ultrasound was used to examine the jugular vein which was compressible and free of internal echoes. A skin marker was used to demarcate the planned venotomy and port pocket incision sites. Local anesthesia was provided to these sites and the subcutaneous tunnel track with 1% lidocaine with 1:100,000 epinephrine. A small incision was created at the jugular access site and blunt dissection was performed of the subcutaneous tissues. Under ultrasound guidance, the jugular vein was accessed with a 21 ga micropuncture needle and an 0.018" wire was inserted to the superior vena cava. Real-time ultrasound guidance was utilized for vascular access including the acquisition of a permanent ultrasound image documenting patency of the accessed vessel. A 5 Fr micopuncture set was then used, through which a 0.035" Rosen wire was passed under fluoroscopic guidance into the inferior vena cava. An 8 Fr dilator was then placed over the wire. A subcutaneous port pocket was then created along the upper chest wall utilizing a combination of sharp and blunt dissection. The pocket was irrigated with sterile saline, packed with gauze, and observed for hemorrhage. A single lumen "ISP" sized power injectable port was chosen for placement. The 8 Fr catheter was tunneled from the port pocket site to the venotomy incision. The port was placed in the pocket. The external catheter was trimmed to appropriate length.  The dilator was exchanged for an 8 Fr peel-away sheath under fluoroscopic guidance. The catheter was then placed through the sheath and the sheath was removed. Final  catheter positioning was confirmed and documented with a fluoroscopic spot radiograph. The port was accessed with a Huber needle, aspirated, and flushed with heparinized saline. The deep dermal layer of the port pocket incision was closed with interrupted 3-0 Vicryl suture. Dermabond was then placed over the port pocket and neck incisions. The patient tolerated the procedure well without immediate post procedural complication. FINDINGS: After catheter placement, the tip lies within the superior cavoatrial junction. The catheter aspirates and flushes normally and is ready for immediate use. IMPRESSION: Successful placement of a power injectable Port-A-Cath via the right internal jugular vein. The catheter is ready for immediate use. Ruthann Cancer, MD [facility] Electronically Signed   By: Ruthann Cancer MD   On: 12/18/2020 14:35     ASSESSMENT:  1.  Stage I (T2N0) pancreatic adenocarcinoma: - Recent admission to the hospital with DKA, found to have elevated liver enzymes. - MRCP without contrast on 11/08/2020 showed 2.3 x 2 cm central pancreatic head and uncinate mass with intra and extrahepatic biliary ductal dilation. - 11/09/2020-ERCP sphincterotomy and stenting with findings showing short high-grade distal CBD stricture with dilated CBD up to 14 mm.  Bile duct brushing was negative for malignant cells. - 11/10/2020-CA 19.9-367. - ERCP/EUS by [name] on 11/30/2020 with 2.9 cm mass in the head of the pancreas with unclear outer borders.  Given significant dilatation, indistinct borders, unable to confidently assess major vascular involvement.  Abutment and probable invasion of SMV and portal vein near the confluence with splenic vein.  No obvious SMA or celiac artery involvement.  No  other adenopathy. - FNA of the pancreatic mass consistent with adenocarcinoma. - Weight loss intentional 25 pounds between January and April 1.  Additional 25 pound weight loss since 1 April, unintentional. - CT [facility] pancreatic protocol on 12/05/2020 with ill-defined mass in the pancreatic head and uncinate process measuring up to 1.9 x 2.1 cm with associated pancreatic ductal dilatation up to 8 mm.  No enlarged adenopathy.  Teardrop morphology of SMV indicative of SMV involvement.  Replaced right hepatic arterial supply arises from the SMA passing immediately posterior to the tumor. - PET scan on 12/14/2020 with mass in the pancreatic head with hypermetabolic features, SUV 5.3 with no sign of metastatic disease.  2.  Social/family history: - He lives at home with his wife and is independent of all ADLs and IADLs. - He currently works in [employer].  No history of chemical exposure. - He previously smoked cigarettes.  Currently smokes cigars 4/day. - Maternal grandmother died at age 15 with malignancy, unknown type. - Sister died of acute leukemia.   PLAN:  1.  Stage I (T2N0) pancreatic adenocarcinoma: - I have reviewed recent hospitalization records when he was septic with bacteremia. - Dr. [name] has done ERCP with fully covered 40 x 10 mm Wallstent placed across the CBD stricture. - He has completed IV antibiotics.  He is feeling much better at this time. - Reviewed labs from 01/10/2021 which showed normal LFTs.  Total bilirubin normalized for the first time.  CBC was grossly normal. - We discussed chemotherapy regimen with FOLFIRINOX. - We discussed side effects at length and precautions to be taken. - He will proceed with cycle 1 today with full doses. - RTC 2 weeks for follow-up for cycle 2.  2.  Weight loss: - Continue Marinol 5 mg twice daily.  3.  Perirectal abscess: - He does not have any symptoms of infection at this time.  4.  Diabetes: - Diabetes of 5 to 6 years duration with no  neuropathy. - Continue Humalog with meals.  5.  Paraspinal low back pain: - Continue Percocet as needed.   Orders placed this encounter:  No orders of the defined types were placed in this encounter.  Total time spent is 40 minutes with more than 50% of the time spent face-to-face discussing and reviewing hospital records, treatment plan, counseling and coordination of care.  Derek Jack, MD [place] [phone]   I, Thana Ates, am acting as a scribe for Dr. Derek Jack.  I, Derek Jack MD, have reviewed the above documentation for accuracy and completeness, and I agree with the above.

## 2021-01-10 ENCOUNTER — Inpatient Hospital Stay (HOSPITAL_COMMUNITY): Payer: BC Managed Care – PPO

## 2021-01-10 ENCOUNTER — Inpatient Hospital Stay (HOSPITAL_BASED_OUTPATIENT_CLINIC_OR_DEPARTMENT_OTHER): Payer: BC Managed Care – PPO | Admitting: Hematology

## 2021-01-10 VITALS — BP 142/64 | HR 74 | Temp 98.0°F | Resp 18

## 2021-01-10 VITALS — BP 150/84 | HR 89 | Temp 96.8°F | Resp 17 | Wt 195.1 lb

## 2021-01-10 DIAGNOSIS — E119 Type 2 diabetes mellitus without complications: Secondary | ICD-10-CM | POA: Diagnosis not present

## 2021-01-10 DIAGNOSIS — C250 Malignant neoplasm of head of pancreas: Secondary | ICD-10-CM | POA: Diagnosis not present

## 2021-01-10 DIAGNOSIS — K8021 Calculus of gallbladder without cholecystitis with obstruction: Secondary | ICD-10-CM | POA: Diagnosis not present

## 2021-01-10 DIAGNOSIS — C259 Malignant neoplasm of pancreas, unspecified: Secondary | ICD-10-CM

## 2021-01-10 DIAGNOSIS — N281 Cyst of kidney, acquired: Secondary | ICD-10-CM | POA: Diagnosis not present

## 2021-01-10 DIAGNOSIS — Z5111 Encounter for antineoplastic chemotherapy: Secondary | ICD-10-CM | POA: Diagnosis not present

## 2021-01-10 DIAGNOSIS — M545 Low back pain, unspecified: Secondary | ICD-10-CM | POA: Diagnosis not present

## 2021-01-10 DIAGNOSIS — Z806 Family history of leukemia: Secondary | ICD-10-CM | POA: Diagnosis not present

## 2021-01-10 DIAGNOSIS — Z8249 Family history of ischemic heart disease and other diseases of the circulatory system: Secondary | ICD-10-CM | POA: Diagnosis not present

## 2021-01-10 DIAGNOSIS — E785 Hyperlipidemia, unspecified: Secondary | ICD-10-CM | POA: Diagnosis not present

## 2021-01-10 DIAGNOSIS — Z79899 Other long term (current) drug therapy: Secondary | ICD-10-CM | POA: Diagnosis not present

## 2021-01-10 DIAGNOSIS — K611 Rectal abscess: Secondary | ICD-10-CM | POA: Diagnosis not present

## 2021-01-10 DIAGNOSIS — R5383 Other fatigue: Secondary | ICD-10-CM | POA: Diagnosis not present

## 2021-01-10 DIAGNOSIS — I10 Essential (primary) hypertension: Secondary | ICD-10-CM | POA: Diagnosis not present

## 2021-01-10 DIAGNOSIS — K449 Diaphragmatic hernia without obstruction or gangrene: Secondary | ICD-10-CM | POA: Diagnosis not present

## 2021-01-10 DIAGNOSIS — M47816 Spondylosis without myelopathy or radiculopathy, lumbar region: Secondary | ICD-10-CM | POA: Diagnosis not present

## 2021-01-10 DIAGNOSIS — I2510 Atherosclerotic heart disease of native coronary artery without angina pectoris: Secondary | ICD-10-CM | POA: Diagnosis not present

## 2021-01-10 DIAGNOSIS — Z888 Allergy status to other drugs, medicaments and biological substances status: Secondary | ICD-10-CM | POA: Diagnosis not present

## 2021-01-10 DIAGNOSIS — I700 Atherosclerosis of aorta: Secondary | ICD-10-CM | POA: Diagnosis not present

## 2021-01-10 DIAGNOSIS — Z95828 Presence of other vascular implants and grafts: Secondary | ICD-10-CM

## 2021-01-10 DIAGNOSIS — R109 Unspecified abdominal pain: Secondary | ICD-10-CM | POA: Diagnosis not present

## 2021-01-10 DIAGNOSIS — M549 Dorsalgia, unspecified: Secondary | ICD-10-CM | POA: Diagnosis not present

## 2021-01-10 DIAGNOSIS — F17290 Nicotine dependence, other tobacco product, uncomplicated: Secondary | ICD-10-CM | POA: Diagnosis not present

## 2021-01-10 LAB — CBC WITH DIFFERENTIAL/PLATELET
Abs Immature Granulocytes: 0.04 10*3/uL (ref 0.00–0.07)
Basophils Absolute: 0.1 10*3/uL (ref 0.0–0.1)
Basophils Relative: 1 %
Eosinophils Absolute: 0 10*3/uL (ref 0.0–0.5)
Eosinophils Relative: 0 %
HCT: 39.2 % (ref 39.0–52.0)
Hemoglobin: 12.4 g/dL — ABNORMAL LOW (ref 13.0–17.0)
Immature Granulocytes: 1 %
Lymphocytes Relative: 13 %
Lymphs Abs: 0.9 10*3/uL (ref 0.7–4.0)
MCH: 30.9 pg (ref 26.0–34.0)
MCHC: 31.6 g/dL (ref 30.0–36.0)
MCV: 97.8 fL (ref 80.0–100.0)
Monocytes Absolute: 0.5 10*3/uL (ref 0.1–1.0)
Monocytes Relative: 6 %
Neutro Abs: 5.7 10*3/uL (ref 1.7–7.7)
Neutrophils Relative %: 79 %
Platelets: 449 10*3/uL — ABNORMAL HIGH (ref 150–400)
RBC: 4.01 MIL/uL — ABNORMAL LOW (ref 4.22–5.81)
RDW: 13.9 % (ref 11.5–15.5)
WBC: 7.2 10*3/uL (ref 4.0–10.5)
nRBC: 0 % (ref 0.0–0.2)

## 2021-01-10 LAB — COMPREHENSIVE METABOLIC PANEL
ALT: 16 U/L (ref 0–44)
AST: 15 U/L (ref 15–41)
Albumin: 3.1 g/dL — ABNORMAL LOW (ref 3.5–5.0)
Alkaline Phosphatase: 120 U/L (ref 38–126)
Anion gap: 6 (ref 5–15)
BUN: 15 mg/dL (ref 8–23)
CO2: 24 mmol/L (ref 22–32)
Calcium: 8.4 mg/dL — ABNORMAL LOW (ref 8.9–10.3)
Chloride: 105 mmol/L (ref 98–111)
Creatinine, Ser: 0.84 mg/dL (ref 0.61–1.24)
GFR, Estimated: >60 mL/min (ref >60)
Glucose, Bld: 219 mg/dL — ABNORMAL HIGH (ref 70–99)
Potassium: 4.2 mmol/L (ref 3.5–5.1)
Sodium: 135 mmol/L (ref 135–145)
Total Bilirubin: 0.8 mg/dL (ref 0.3–1.2)
Total Protein: 6.5 g/dL (ref 6.5–8.1)

## 2021-01-10 LAB — MAGNESIUM: Magnesium: 1.7 mg/dL (ref 1.7–2.4)

## 2021-01-10 MED ORDER — SODIUM CHLORIDE 0.9 % IV SOLN
150.0000 mg | Freq: Once | INTRAVENOUS | Status: AC
  Administered 2021-01-10: 150 mg via INTRAVENOUS
  Filled 2021-01-10: qty 150

## 2021-01-10 MED ORDER — OXALIPLATIN CHEMO INJECTION 100 MG/20ML
85.0000 mg/m2 | Freq: Once | INTRAVENOUS | Status: AC
  Administered 2021-01-10: 180 mg via INTRAVENOUS
  Filled 2021-01-10: qty 36

## 2021-01-10 MED ORDER — SODIUM CHLORIDE 0.9 % IV SOLN
2350.0000 mg/m2 | INTRAVENOUS | Status: DC
  Administered 2021-01-10: 5000 mg via INTRAVENOUS
  Filled 2021-01-10: qty 100

## 2021-01-10 MED ORDER — SODIUM CHLORIDE 0.9 % IV SOLN
150.0000 mg/m2 | Freq: Once | INTRAVENOUS | Status: AC
  Administered 2021-01-10: 320 mg via INTRAVENOUS
  Filled 2021-01-10: qty 2

## 2021-01-10 MED ORDER — ATROPINE SULFATE 1 MG/ML IJ SOLN
0.5000 mg | Freq: Once | INTRAMUSCULAR | Status: AC | PRN
  Administered 2021-01-10: 0.5 mg via INTRAVENOUS
  Filled 2021-01-10: qty 1

## 2021-01-10 MED ORDER — DEXTROSE 5 % IV SOLN: Freq: Once | INTRAVENOUS | Status: AC

## 2021-01-10 MED ORDER — SODIUM CHLORIDE 0.9 % IV SOLN
10.0000 mg | Freq: Once | INTRAVENOUS | Status: AC
  Administered 2021-01-10: 10 mg via INTRAVENOUS
  Filled 2021-01-10: qty 10

## 2021-01-10 MED ORDER — SODIUM CHLORIDE 0.9 % IV SOLN
400.0000 mg/m2 | Freq: Once | INTRAVENOUS | Status: AC
  Administered 2021-01-10: 852 mg via INTRAVENOUS
  Filled 2021-01-10: qty 42.6

## 2021-01-10 MED ORDER — PALONOSETRON HCL INJECTION 0.25 MG/5ML
0.2500 mg | Freq: Once | INTRAVENOUS | Status: AC
  Administered 2021-01-10: 0.25 mg via INTRAVENOUS
  Filled 2021-01-10: qty 5

## 2021-01-10 NOTE — Patient Instructions (Addendum)
[facility]otherapy Teaching   You are diagnosed with Stage I pancreatic adenocarcinoma.  You will be treated in the clinic every 2 weeks with a combination of chemotherapy drugs - those drugs are oxaliplatin, irinotecan, fluorouracil (5FU), and leucovorin (not a chemo drug).  The intent of treatment is cure.  You will see the doctor regularly throughout treatment.  We will obtain blood work from you prior to every treatment and monitor your results to make sure it is safe to give your treatment. The doctor monitors your response to treatment by the way you are feeling, your blood work, and by obtaining scans periodically.  There will be wait times while you are here for treatment.  It will take about 30 minutes to 1 hour for your lab work to result.  Then there will be wait times while pharmacy mixes your medications.   Medications you will receive in the clinic prior to your chemotherapy medications:  Aloxi:  ALOXI is used in adults to help prevent nausea and vomiting that happens with certain chemotherapy drugs.  Aloxi is a long acting medication, and will remain in your system for about two days.   Emend:  This is an anti-nausea medication that is used with Aloxi to help prevent nausea and vomiting caused by chemotherapy.  Dexamethasone:  This is a steroid given prior to chemotherapy to help prevent allergic reactions; it may also help prevent and control nausea and diarrhea.     Oxaliplatin (Eloxatin)  About This Drug  Oxaliplatin is used to treat cancer. It is given in the vein (IV).  It takes two hours to infuse.  Possible Side Effects   Bone marrow suppression. This is a decrease in the number of white blood cells, red blood cells, and platelets. This may raise your risk of infection, make you tired and weak (fatigue), and raise your risk of bleeding.   Tiredness   Soreness of the mouth and throat. You may have red areas, white patches, or sores that hurt.    Nausea and vomiting (throwing up)   Diarrhea (loose bowel movements)   Changes in your liver function   Effects on the nerves called peripheral neuropathy. You may feel numbness, tingling, or pain in your hands and feet, and may be worse in cold temperatures. It may be hard for you to button your clothes, open jars, or walk as usual. The effect on the nerves may get worse with more doses of the drug. These effects get better in some people after the drug is stopped but it does not get better in all people  Note: Each of the side effects above was reported in 40% or greater of patients treated with oxaliplatin. Not all possible side effects are included above.   Warnings and Precautions   Allergic reactions, including anaphylaxis, which may be life-threatening are rare but may happen in some patients. Signs of allergic reaction to this drug may be swelling of the face, feeling like your tongue or throat are swelling, trouble breathing, rash, itching, fever, chills, feeling dizzy, and/or feeling that your heart is beating in a fast or not normal way. If this happens, do not take another dose of this drug. You should get urgent medical treatment.   Inflammation (swelling) of the lungs, which may be life-threatening. You may have a dry cough or trouble breathing.   Effects on the nerves (neuropathy) may resolve within 14 days, or it may persist beyond 14 days.   Severe decrease  in white blood cells when combined with the chemotherapy agents 5-fluorouracil and leucovorin. This may be life-threatening.   Severe changes in your liver function   Abnormal heart beat and/or EKG, which can be life-threatening   Rhabdomyolysis- damage to your muscles which may release proteins in your blood and affect how your kidneys work, which can be life-threatening. You may have severe muscle weakness and/or pain, or dark urine.  Important Information   This drug may impair your ability to drive or use  machinery. Talk to your doctor and/or nurse about precautions you may need to take.   This drug may be present in the saliva, tears, sweat, urine, stool, vomit, semen, and vaginal secretions. Talk to your doctor and/or your nurse about the necessary precautions to take during this time.  * The effects on the nerves can be aggravated by exposure to cold. Avoid cold beverages, use of ice and make sure you cover your skin and dress warmly prior to being exposed to cold temperatures while you are receiving treatment with oxaliplatin*   Treating Side Effects   Manage tiredness by pacing your activities for the day.   Be sure to include periods of rest between energy-draining activities.   To decrease the risk of infection, wash your hands regularly.   Avoid close contact with people who have a cold, the flu, or other infections.  Take your temperature as your doctor or nurse tells you, and whenever you feel like you may have a fever.   To help decrease the risk of bleeding, use a soft toothbrush. Check with your nurse before using dental floss.   Be very careful when using knives or tools.   Use an electric shaver instead of a razor.   Drink plenty of fluids (a minimum of eight glasses per day is recommended).   Mouth care is very important. Your mouth care should consist of routine, gentle cleaning of your teeth or dentures and rinsing your mouth with a mixture of 1/2 teaspoon of salt in 8 ounces of water or 1/2 teaspoon of baking soda in 8 ounces of water. This should be done at least after each meal and at bedtime.   If you have mouth sores, avoid mouthwash that has alcohol. Also avoid alcohol and smoking because they can bother your mouth and throat.   To help with nausea and vomiting, eat small, frequent meals instead of three large meals a day. Choose foods and drinks that are at room temperature. Ask your nurse or doctor about other helpful tips and medicine that is available to help  stop or lessen these symptoms.   If you throw up or have loose bowel movements, you should drink more fluids so that you do not become dehydrated (lack of water in the body from losing too much fluid).   If you have diarrhea, eat low-fiber foods that are high in protein and calories and avoid foods that can irritate your digestive tracts or lead to cramping.   Ask your nurse or doctor about medicine that can lessen or stop your diarrhea.   If you have numbness and tingling in your hands and feet, be careful when cooking, walking, and handling sharp objects and hot liquids.   Do not drink cold drinks or use ice in beverages. Drink fluids at room temperature or warmer, and drink through a straw.   Wear gloves to touch cold objects, and wear warm clothing and cover you skin during cold weather.   Food and  Drug Interactions   There are no known interactions of oxaliplatin with food and other medications.   This drug may interact with other medicines. Tell your doctor and pharmacist about all the prescription and over-the-counter medicines and dietary supplements (vitamins, minerals, herbs and others) that you are taking at this time. Also, check with your doctor or pharmacist before starting any new prescription or over-the-counter medicines, or dietary supplements to make sure that there are no interactions   When to Call the Doctor  Call your doctor or nurse if you have any of these symptoms and/or any new or unusual symptoms:   Fever of 100.4 F (38 C) or higher   Chills   Tiredness that interferes with your daily activities   Feeling dizzy or lightheaded   Easy bleeding or bruising   Feeling that your heart is beating in a fast or not normal way (palpitations)   Pain in your chest   Dry cough   Trouble breathing   Pain in your mouth or throat that makes it hard to eat or drink   Nausea that stops you from eating or drinking and/or is not relieved by prescribed  medicines   Throwing up   Diarrhea, 4 times in one day or diarrhea with lack of strength or a feeling of being dizzy   Numbness, tingling, or pain in your hands and feet   Signs of possible liver problems: dark urine, pale bowel movements, bad stomach pain, feeling very tired and weak, unusual itching, or yellowing of the eyes or skin   Signs of rhabdomyolysis: decreased urine, very dark urine, muscle pain in the shoulders, thighs, or lower back; muscle weakness or trouble moving arms and legs   Signs of allergic reaction: swelling of the face, feeling like your tongue or throat are swelling, trouble breathing, rash, itching, fever, chills, feeling dizzy, and/or feeling that your heart is beating in a fast or not normal way. If this happens, call 911 for emergency care.   If you think you may be pregnant  Reproduction Warnings   Pregnancy warning: This drug may have harmful effects on the unborn baby. Women of childbearing potential should use effective methods of birth control during your cancer treatment. Let your doctor know right away if you think you may be pregnant or may have impregnated your partner.   Breastfeeding warning: It is not known if this drug passes into breast milk. For this reason, women should talk to their doctor about the risks and benefits of breastfeeding during treatment with this drug because this drug may enter the breast milk and cause harm to a breastfeeding baby.   Fertility warning: Human fertility studies have not been done with this drug. Talk with your doctor or nurse if you plan to have children. Ask for information on sperm or egg banking.   Irinotecan (Camptosar)    About This Drug  Irinotecan is used to treat cancer. It is given in the vein (IV). It will take 1.5 hours to infuse.    Possible Side Effects   Bone marrow suppression. This is a decrease in the number of white blood cells, red blood cells, and platelets. This may raise your risk of  infection, make you tired and weak, and raise your risk of bleeding.    Soreness of the mouth and throat. You may have red areas, white patches, or sores that hurt.    Nausea and vomiting (throwing up)    Pain in your abdomen  Diarrhea (  loose bowel movements)    Constipation (unable to move bowels)    Decreased appetite (decreased hunger)    Weight loss    Changes in your liver function    Pain    Weakness    Fever    Infection    Hair loss. Hair loss is often temporary, although with certain medicine, hair loss can sometimes be permanent. Hair loss may happen suddenly or gradually. If you lose hair, you may lose it from your head, face, armpits, pubic area, chest, and/or legs. You may also notice your hair getting thin.   Note: Each of the side effects above was reported in 30% or greater of patients treated with irinotecan alone or in combination with other chemotherapy. Not all possible side effects are included above.   Warnings and Precautions    Severe diarrhea and colitis which is swelling (inflammation) in the colon - symptoms are diarrhea, stomach cramping, and sometimes blood in the bowel movements. Very rarely, an abnormal hole in your stomach, small and/or large intestine can happen. Diarrhea can begin shortly after the infusion or up to a week or two after, and can be life-threatening if it leads to dehydration, and other complications.     Severe bone marrow suppression which can be life-threatening    Allergic reactions, including anaphylaxis are rare but may happen in some patients. Signs of allergic reaction to this drug may be swelling of the face, feeling like your tongue or throat are swelling, trouble breathing, rash, itching, fever, chills, feeling dizzy, and/or feeling that your heart is beating in a fast or not normal way. If this happens, do not take another dose of this drug. You should get urgent medical treatment.    Changes in your kidney function  which can cause kidney failure and be life-threatening    Inflammation (swelling) or scarring of the lungs, which can be life-threatening. You may have a cough or trouble breathing. Note: Some of the side effects above are very rare. If you have concerns and/or questions, please discuss them with your medical team. Important Information    This drug may be present in the saliva, tears, sweat, urine, stool, vomit, semen, and vaginal secretions. Talk to your doctor and/or your nurse about the necessary precautions to take during this time.    It is important that you notify your doctor and/or nurse at the first sign of diarrhea, so they can provide you with anti-diarrheal medication and give you further instructions. Notify your doctor and/ or nurse if you are taking anti-diarrheal medication and your symptoms have not improved or are worsening.    Treating Side Effects    Manage tiredness by pacing your activities for the day.    Be sure to include periods of rest between energy-draining activities.    To decrease the risk of infection, wash your hands regularly.  Avoid close contact with people who have a cold, the flu, or other infections.    Take your temperature as your doctor or nurse tells you, and whenever you feel like you may have a fever.    To help decrease the risk of bleeding, use a soft toothbrush. Check with your nurse before using dental floss.    Be very careful when using knives or tools.    Use an electric shaver instead of a razor.    Drink plenty of fluids (a minimum of eight glasses per day is recommended).    If you throw up or have diarrhea,  you should drink more fluids so that you do not become dehydrated (lack of water in the body from losing too much fluid).    Mouth care is very important. Your mouth care should consist of routine, gentle cleaning of your teeth or dentures and rinsing your mouth with a mixture of 1/2 teaspoon of salt in 8 ounces of water or 1/2  teaspoon of baking soda in 8 ounces of water. This should be done at least after each meal and at bedtime.    If you have mouth sores, avoid mouthwash that has alcohol. Also avoid alcohol and smoking because they can bother your mouth and throat.     To help with nausea and vomiting, eat small, frequent meals instead of three large meals a day. Choose foods and drinks that are at room temperature. Ask your nurse or doctor about other helpful tips and medicine that is available to help stop or lessen these symptoms.    If you have diarrhea, eat low-fiber foods that are high in protein and calories and avoid foods that can irritate your digestive tracts or lead to cramping.    If you are not able to move your bowels, check with your doctor or nurse before you use enemas, laxatives, or suppositories.    Ask your doctor or nurse about medicines that are available to help stop or lessen constipation and/ or diarrhea.    To help with decreased appetite, eat small, frequent meals. Eat foods high in calories and protein, such as meat, poultry, fish, dry beans, tofu, eggs, nuts, milk, yogurt, cheese, ice cream, pudding, and nutritional supplements.    Consider using sauces and spices to increase taste. Daily exercise, with your doctor's approval, may increase your appetite.    To help with weight loss, drink fluids that contribute calories (whole milk, juice, soft drinks, sweetened beverages, milkshakes, and nutritional supplements) instead of water.    Keeping your pain under control is important to your well-being. Please tell your doctor or nurse if you are experiencing pain.    To help with hair loss, wash with a mild shampoo and avoid washing your hair every day. Avoid coloring your hair.    Avoid rubbing your scalp, pat your hair or scalp dry.    Limit your use of hair spray, electric curlers, blow dryers, and curling irons.    If you are interested in getting a wig, talk to your nurse and  they can help you get in touch with programs in your local area.    Food and Drug Interactions    This drug may interact with grapefruit and grapefruit juice. Talk to your doctor as this could make side effects worse.    Check with your doctor or pharmacist about all other prescription medicines and over-the-counter medicines and dietary supplements (vitamins, minerals, herbs, and others) you are taking before starting this medicine as there are known drug interactions with irinotecan. Also, check with your doctor or pharmacist before starting any new prescription or over-the-counter medicines, or dietary supplements to make sure that there are no interactions.    Avoid the use of St. John's Wort while taking irinotecan as this may lower the levels of the drug in your body, which can make it less effective.    When to Call the Doctor   Call your doctor or nurse if you have any of these symptoms and/or any new or unusual symptoms:    Fever of 100.4 F (38 C) or higher  Chills    Pain in your chest     Dry cough    Trouble breathing and/or wheezing    Feeling dizzy or lightheaded    Easy bleeding or bruising    Tiredness or weakness that interferes with your daily activities    Pain in your mouth or throat that makes it hard to eat or drink    Nausea that stops you from eating or drinking and/or is not relieved by prescribed medicines    Throwing up   Diarrhea, 4 times in one day or diarrhea with lack of strength or a feeling of being dizzy    No bowel movement in 3 days or when you feel uncomfortable    Severe abdominal pain that does not go away    Difficulty swallowing    General pain that does not go away, or is not relieved by prescribed medicines    Blood in your stool    Lasting loss of appetite or rapid weight loss of five pounds in a week    Decreased or very dark urine    Signs of allergic reaction: swelling of the face, feeling like your tongue or  throat are swelling, trouble breathing, rash, itching, fever, chills, feeling dizzy, and/or feeling that your heart is beating in a fast or not normal way. If this happens, call 911 for emergency care.    Signs of possible liver problems: dark urine, pale bowel movements, pain in your abdomen, feeling very tired and weak, unusual itching, or yellowing of the eyes or skin    If you think you may be pregnant or may have impregnated your partner    Reproduction Warnings    Pregnancy warning: This drug can have harmful effects on the unborn baby. Women of childbearing potential should use effective methods of birth control during your cancer treatment and for 6 months after treatment. Men with male partners of childbearing potential should use effective methods of birth control during your cancer treatment and for 3 months after your cancer treatment. Let your doctor know right away if you think you may be pregnant or may have impregnated your partner.    Breastfeeding warning: Women should not breastfeed during treatment and for at least 7 days after treatment because this drug can enter the breast milk and cause harm to a breastfeeding baby.  Fertility warning: In men and women both, this drug may affect your ability to have children in the future. Talk with your doctor or nurse if you plan to have children. Ask for information on sperm or egg banking. Revised August 2021   Leucovorin Calcium  About This Drug  Leucovorin is a vitamin. It is used in combination with other cancer fighting drugs such as 5-fluorouracil and methotrexate. Leucovorin is given in the vein (IV).  This drug runs at the same time as the oxaliplatin and takes 2 hours to infuse.   Possible Side Effects  Rash and itching  Note: Leucovorin by itself has very few side effects. Other side effects you may have can be caused by the other drugs you are taking, such as 5-fluorouracil.    Warnings and Precautions   Allergic  reactions, including anaphylaxis are rare but may happen in some patients. Signs of allergic reaction to this drug may be swelling of the face, feeling like your tongue or throat are swelling, trouble breathing, rash, itching, fever, chills, feeling dizzy, and/or feeling that your heart is beating in a fast or not normal way.  If this happens, do not take another dose of this drug. You should get urgent medical treatment.   Food and Drug Interactions   There are no known interactions of leucovorin with food.   This drug may interact with other medicines. Tell your doctor and pharmacist about all the prescription and over-the-counter medicines and dietary supplements (vitamins, minerals, herbs and others) that you are taking at this time.   Also, check with your doctor or pharmacist before starting any new prescription or over-the-counter medicines, or dietary supplements to make sure that there are no interactions.   When to Call the Doctor  Call your doctor or nurse if you have any of these symptoms and/or any new or unusual symptoms:   A new rash or a rash that is not relieved by prescribed medicines   Signs of allergic reaction: swelling of the face, feeling like your tongue or throat are swelling, trouble breathing, rash, itching, fever, chills, feeling dizzy, and/or feeling that your heart is beating in a fast or not normal way. If this happens, call 911 for emergency care.   If you think you may be pregnant   Reproduction Warnings   Pregnancy warning: It is not known if this drug may harm an unborn child. For this reason, be sure to talk with your doctor if you are pregnant or planning to become pregnant while receiving this drug. Let your doctor know right away if you think you may be pregnant   Breastfeeding warning: It is not known if this drug passes into breast milk. For this reason, women should talk to their doctor about the risks and benefits of breastfeeding during treatment  with this drug because this drug may enter the breast milk and cause harm to a breastfeeding baby.   Fertility warning: Human fertility studies have not been done with this drug. Talk with your doctor or nurse if you plan to have children. Ask for information on sperm or egg banking.   5-Fluorouracil (Adrucil; 5FU)  About This Drug  Fluorouracil is used to treat cancer. It is given in the vein (IV). It is given as a continuous infusion given via an ambulatory pump (a pump you take home and wear for a specified amount of time).  You will wear your pump for 46 hours.    Possible Side Effects   Bone marrow suppression. This is a decrease in the number of white blood cells, red blood cells, and platelets. This may raise your risk of infection, make you tired and weak (fatigue), and raise your risk of bleeding   Changes in the tissue of the heart and/or heart attack. Some changes may happen that can cause your heart to have less ability to pump blood.   Blurred vision or other changes in eyesight   Nausea and throwing up (vomiting)   Diarrhea (loose bowel movements)   Ulcers - sores that may cause pain or bleeding in your digestive tract, which includes your mouth, esophagus, stomach, small/large intestines and rectum   Soreness of the mouth and throat. You may have red areas, white patches, or sores that hurt.   Allergic reactions, including anaphylaxis are rare but may happen in some patients. Signs of allergic reaction to this drug may be swelling of the face, feeling like your tongue or throat are swelling, trouble breathing, rash, itching, fever, chills, feeling dizzy, and/or feeling that your heart is beating in a fast or not normal way. If this happens, do not take another dose  of this drug. You should get urgent medical treatment.   Sensitivity to light (photosensitivity). Photosensitivity means that you may become more sensitive to the sun and/or light. You may get a skin  rash/reaction if you are in the sun or are exposed to sun lamps and tanning beds. Your eyes may water more, mostly in bright light.   Changes in your nail color, nail loss and/or brittle nail   Darkening of the skin, or changes to the color of your skin and/or veins used for infusion   Rash, dry skin, or itching  Note: Not all possible side effects are included above.  Warnings and Precautions   Hand-and-foot syndrome. The palms of your hands or soles of your feet may tingle, become numb, painful, swollen, or red.   Changes in your central nervous system can happen. The central nervous system is made up of your brain and spinal cord. You could feel extreme tiredness, agitation, confusion, hallucinations (see or hear things that are not there), trouble understanding or speaking, loss of control of your bowels or bladder, eyesight changes, numbness or lack of strength to your arms, legs, face, or body, or coma. If you start to have any of these symptoms let your doctor know right away.   Side effects of this drug may be unexpectedly severe in some patients  Note: Some of the side effects above are very rare. If you have concerns and/or questions, please discuss them with your medical team.   Important Information   This drug may be present in the saliva, tears, sweat, urine, stool, vomit, semen, and vaginal secretions. Talk to your doctor and/or your nurse about the necessary precautions to take during this time.   Treating Side Effects   Manage tiredness by pacing your activities for the day.   Be sure to include periods of rest between energy-draining activities.   To help decrease the risk of infections, wash your hands regularly.   Avoid close contact with people who have a cold, the flu, or other infections.   Take your temperature as your doctor or nurse tells you, and whenever you feel like you may have a fever.   Use a soft toothbrush. Check with your nurse before using  dental floss.   Be very careful when using knives or tools.   Use an electric shaver instead of a razor.   If you have a nose bleed, sit with your head tipped slightly forward. Apply pressure by lightly pinching the bridge of your nose between your thumb and forefinger. Call your doctor if you feel dizzy or faint or if the bleeding doesn't stop after 10 to 15 minutes.   Drink plenty of fluids (a minimum of eight glasses per day is recommended).   If you throw up or have loose bowel movements, you should drink more fluids so that you do not  become dehydrated (lack of water in the body from losing too much fluid).   To help with nausea and vomiting, eat small, frequent meals instead of three large meals a day. Choose foods and drinks that are at room temperature. Ask your nurse or doctor about other helpful tips and medicine that is available to help, stop, or lessen these symptoms.   If you have diarrhea, eat low-fiber foods that are high in protein and calories and avoid foods that can irritate your digestive tracts or lead to cramping.   Ask your nurse or doctor about medicine that can lessen or stop your diarrhea.  Mouth care is very important. Your mouth care should consist of routine, gentle cleaning of your teeth or dentures and rinsing your mouth with a mixture of 1/2 teaspoon of salt in 8 ounces of water or 1/2 teaspoon of baking soda in 8 ounces of water. This should be done at least after each meal and at bedtime.   If you have mouth sores, avoid mouthwash that has alcohol. Also avoid alcohol and smoking because they can bother your mouth and throat.   Keeping your nails moisturized may help with brittleness.   To help with itching, moisturize your skin several times day.   Use sunscreen with SPF 30 or higher when you are outdoors even for a short time. Cover up when you are out in the sun. Wear wide-brimmed hats, long-sleeved shirts, and pants. Keep your neck, chest, and back  covered. Wear dark sun glasses when in the sun or bright lights.   If you get a rash do not put anything on it unless your doctor or nurse says you may. Keep the area around the rash clean and dry. Ask your doctor for medicine if your rash bothers you.   Keeping your pain under control is important to your well-being. Please tell your doctor or nurse if you are experiencing pain.   Food and Drug Interactions   There are no known interactions of fluorouracil with food.   Check with your doctor or pharmacist about all other prescription medicines and over-the-counter medicines and dietary supplements (vitamins, minerals, herbs and others) you are taking before starting this medicine as there are known drug interactions with 5-fluoroucacil. Also, check with your doctor or pharmacist before starting any new prescription or over-the-counter medicines, or dietary supplements to make sure that there are no interactions.   When to Call the Doctor  Call your doctor or nurse if you have any of these symptoms and/or any new or unusual symptoms:   Fever of 100.4 F (38 C) or higher   Chills   Easy bleeding or bruising   Nose bleed that doesn't stop bleeding after 10-15 minutes   Trouble breathing   Feeling dizzy or lightheaded   Feeling that your heart is beating in a fast or not normal way (palpitations)   Chest pain or symptoms of a heart attack. Most heart attacks involve pain in the center of the chest that lasts more than a few minutes. The pain may go away and come back or it can be constant. It can feel like pressure, squeezing, fullness, or pain. Sometimes pain is felt in one or both arms, the back, neck, jaw, or stomach. If any of these symptoms last 2 minutes, call 911.   Confusion and/or agitation   Hallucinations   Trouble understanding or speaking   Loss of control of bowels or bladder   Blurry vision or changes in your eyesight   Headache that does not go away    Numbness or lack of strength to your arms, legs, face, or body   Nausea that stops you from eating or drinking and/or is not relieved by prescribed medicines   Throwing up    Diarrhea, 4 times in one day or diarrhea with lack of strength or a feeling of being dizzy   Pain in your mouth or throat that makes it hard to eat or drink   Pain along the digestive tract - especially if worse after eating   Blood in your vomit (bright red or coffee-ground) and/or stools (bright  red, or black/tarry)   Coughing up blood   Tiredness that interferes with your daily activities   Painful, red, or swollen areas on your hands or feet or around your nails   A new rash or a rash that is not relieved by prescribed medicines   Develop sensitivity to sunlight/light   Numbness and/or tingling of your hands and/or feet   Signs of allergic reaction: swelling of the face, feeling like your tongue or throat are swelling, trouble breathing, rash, itching, fever, chills, feeling dizzy, and/or feeling that your heart is beating in a fast or not normal way. If this happens, call 911 for emergency care.   If you think you are pregnant or may have impregnated your partner  Reproduction Warnings   Pregnancy warning: This drug may have harmful effects on the unborn baby. Women of child bearing potential should use effective methods of birth control during your cancer treatment and 3 months after treatment. Men with male partners of childbearing potential should use effective methods of birth control during your cancer treatment and for 3 months after your cancer treatment. Let your doctor know right away if you think you may be pregnant or may have impregnated your partner.   Breastfeeding warning: It is not known if this drug passes into breast milk. For this reason, Women should not breastfeed during treatment because this drug could enter the breast milk and cause harm to a breastfeeding baby.   Fertility  warning: In men and women both, this drug may affect your ability to have children in the future. Talk with your doctor or nurse if you plan to have children. Ask for information on sperm or egg banking.   SELF CARE ACTIVITIES WHILE RECEIVING CHEMOTHERAPY:  Hydration Increase your fluid intake 48 hours prior to treatment and drink at least 8 to 12 cups (64 ounces) of water/decaffeinated beverages per day after treatment. You can still have your cup of coffee or soda but these beverages do not count as part of your 8 to 12 cups that you need to drink daily. No alcohol intake.  Medications Continue taking your normal prescription medication as prescribed.  If you start any new herbal or new supplements please let us know first to make sure it is safe.  Mouth Care Have teeth cleaned professionally before starting treatment. Keep dentures and partial plates clean. Use soft toothbrush and do not use mouthwashes that contain alcohol. Biotene is a good mouthwash that is available at most pharmacies or may be ordered by calling [phone]. Use warm salt water gargles (1 teaspoon salt per 1 quart warm water) before and after meals and at bedtime. If you need dental work, please let the doctor know before you go for your appointment so that we can coordinate the best possible time for you in regards to your chemo regimen. You need to also let your dentist know that you are actively taking chemo. We may need to do labs prior to your dental appointment.  Skin Care Always use sunscreen that has not expired and with SPF (Sun Protection Factor) of 50 or higher. Wear hats to protect your head from the sun. Remember to use sunscreen on your hands, ears, face, & feet.  Use good moisturizing lotions such as udder cream, eucerin, or even Vaseline. Some chemotherapies can cause dry skin, color changes in your skin and nails.    Avoid long, hot showers or baths. Use gentle, fragrance-free soaps and laundry  detergent. Use moisturizers, preferably  creams or ointments rather than lotions because the thicker consistency is better at preventing skin dehydration. Apply the cream or ointment within 15 minutes of showering. Reapply moisturizer at night, and moisturize your hands every time after you wash them.  Hair Loss (if your doctor says your hair will fall out)  If your doctor says that your hair is likely to fall out, decide before you begin chemo whether you want to wear a wig. You may want to shop before treatment to match your hair color. Hats, turbans, and scarves can also camouflage hair loss, although some people prefer to leave their heads uncovered. If you go bare-headed outdoors, be sure to use sunscreen on your scalp. Cut your hair short. It eases the inconvenience of shedding lots of hair, but it also can reduce the emotional impact of watching your hair fall out. Don't perm or color your hair during chemotherapy. Those chemical treatments are already damaging to hair and can enhance hair loss. Once your chemo treatments are done and your hair has grown back, it's OK to resume dyeing or perming hair.  With chemotherapy, hair loss is almost always temporary. But when it grows back, it may be a different color or texture. In older adults who still had hair color before chemotherapy, the new growth may be completely gray.  Often, new hair is very fine and soft.  Infection Prevention Please wash your hands for at least 30 seconds using warm soapy water. Handwashing is the #1 way to prevent the spread of germs. Stay away from sick people or people who are getting over a cold. If you develop respiratory systems such as green/yellow mucus production or productive cough or persistent cough let us know and we will see if you need an antibiotic. It is a good idea to keep a pair of gloves on when going into grocery stores/[employer] to decrease your risk of coming into contact with germs on the carts, etc.  Carry alcohol hand gel with you at all times and use it frequently if out in public. If your temperature reaches 100.5 or higher please call the clinic and let us know.  If it is after hours or on the weekend please go to the ER if your temperature is over 100.5.  Please have your own personal thermometer at home to use.    Sex and bodily fluids If you are going to have sex, a condom must be used to protect the person that isn't taking chemotherapy. Chemo can decrease your libido (sex drive). For a few days after chemotherapy, chemotherapy can be excreted through your bodily fluids.  When using the toilet please close the lid and flush the toilet twice.  Do this for a few day after you have had chemotherapy.   Effects of chemotherapy on your sex life Some changes are simple and won't last long. They won't affect your sex life permanently.  Sometimes you may feel: too tired not strong enough to be very active sick or sore  not in the mood anxious or low Your anxiety might not seem related to sex. For example, you may be worried about the cancer and how your treatment is going. Or you may be worried about money, or about how you family are coping with your illness.  These things can cause stress, which can affect your interest in sex. It's important to talk to your partner about how you feel.  Remember - the changes to your sex life don't usually last long.  There's usually no medical reason to stop having sex during chemo. The drugs won't have any long term physical effects on your performance or enjoyment of sex. Cancer can't be passed on to your partner during sex  Contraception It's important to use reliable contraception during treatment. Avoid getting pregnant while you or your partner are having chemotherapy. This is because the drugs may harm the baby. Sometimes chemotherapy drugs can leave a man or woman infertile.  This means you would not be able to have children in the future. You might  want to talk to someone about permanent infertility. It can be very difficult to learn that you may no longer be able to have children. Some people find counselling helpful. There might be ways to preserve your fertility, although this is easier for men than for women. You may want to speak to a fertility expert. You can talk about sperm banking or harvesting your eggs. You can also ask about other fertility options, such as donor eggs. If you have or have had breast cancer, your doctor might advise you not to take the contraceptive pill. This is because the hormones in it might affect the cancer. It is not known for sure whether or not chemotherapy drugs can be passed on through semen or secretions from the vagina. Because of this some doctors advise people to use a barrier method if you have sex during treatment. This applies to vaginal, anal or oral sex. Generally, doctors advise a barrier method only for the time you are actually having the treatment and for about a week after your treatment. Advice like this can be worrying, but this does not mean that you have to avoid being intimate with your partner. You can still have close contact with your partner and continue to enjoy sex.  Animals If you have cats or birds we just ask that you not change the litter or change the cage.  Please have someone else do this for you while you are on chemotherapy.   Food Safety During and After Cancer Treatment Food safety is important for people both during and after cancer treatment. Cancer and cancer treatments, such as chemotherapy, radiation therapy, and stem cell/bone marrow transplantation, often weaken the immune system. This makes it harder for your body to protect itself from foodborne illness, also called food poisoning. Foodborne illness is caused by eating food that contains harmful bacteria, parasites, or viruses.  Foods to avoid Some foods have a higher risk of becoming tainted with bacteria. These  include: Unwashed fresh fruit and vegetables, especially leafy vegetables that can hide dirt and other contaminants Raw sprouts, such as alfalfa sprouts Raw or undercooked beef, especially ground beef, or other raw or undercooked meat and poultry Fatty, fried, or spicy foods immediately before or after treatment.  These can sit heavy on your stomach and make you feel nauseous. Raw or undercooked shellfish, such as oysters. Sushi and sashimi, which often contain raw fish.  Unpasteurized beverages, such as unpasteurized fruit juices, raw milk, raw yogurt, or cider Undercooked eggs, such as soft boiled, over easy, and poached; raw, unpasteurized eggs; or foods made with raw egg, such as homemade raw cookie dough and homemade mayonnaise  Simple steps for food safety  Shop smart. Do not buy food stored or displayed in an unclean area. Do not buy bruised or damaged fruits or vegetables. Do not buy cans that have cracks, dents, or bulges. Pick up foods that can spoil at the end of your shopping  trip and store them in a cooler on the way home.  Prepare and clean up foods carefully. Rinse all fresh fruits and vegetables under running water, and dry them with a clean towel or paper towel. Clean the top of cans before opening them. After preparing food, wash your hands for 20 seconds with hot water and soap. Pay special attention to areas between fingers and under nails. Clean your utensils and dishes with hot water and soap. Disinfect your kitchen and cutting boards using 1 teaspoon of liquid, unscented bleach mixed into 1 quart of water.    Dispose of old food. Eat canned and packaged food before its expiration date (the "use by" or "best before" date). Consume refrigerated leftovers within 3 to 4 days. After that time, throw out the food. Even if the food does not smell or look spoiled, it still may be unsafe. Some bacteria, such as Listeria, can grow even on foods stored in the refrigerator if  they are kept for too long.  Take precautions when eating out. At restaurants, avoid buffets and salad bars where food sits out for a long time and comes in contact with many people. Food can become contaminated when someone with a virus, often a norovirus, or another "bug" handles it. Put any leftover food in a "to-go" container yourself, rather than having the server do it. And, refrigerate leftovers as soon as you get home. Choose restaurants that are clean and that are willing to prepare your food as you order it cooked.   AT HOME MEDICATIONS:                                                                                                                                                                Compazine/Prochlorperazine '10mg'$  tablet. Take 1 tablet every 6 hours as needed for nausea/vomiting. (This can make you sleepy)   EMLA cream. Apply a quarter size amount to port site 1 hour prior to chemo. Do not rub in. Cover with plastic wrap.    Diarrhea Sheet   If you are having loose stools/diarrhea, please purchase Imodium and begin taking as outlined:  At the first sign of poorly formed or loose stools you should begin taking Imodium (loperamide) 2 mg capsules.  Take two tablets ('4mg'$ ) followed by one tablet ('2mg'$ ) every 2 hours - DO NOT EXCEED 8 tablets in 24 hours.  If it is bedtime and you are having loose stools, take 2 tablets at bedtime, then 2 tablets every 4 hours until morning.   Always call the [facility] if you are having loose stools/diarrhea that you can't get under control.  Loose stools/diarrhea leads to dehydration (loss of water) in your body.  We have other options of trying to get the loose stools/diarrhea to stop  but you must let us know!   Constipation Sheet  Colace - 100 mg capsules - take 2 capsules daily.  If this doesn't help then you can increase to 2 capsules twice daily.  Please call if the above does not work for you. Do not go more than 2 days without a  bowel movement.  It is very important that you do not become constipated.  It will make you feel sick to your stomach (nausea) and can cause abdominal pain and vomiting.  Nausea Sheet   Compazine/Prochlorperazine '10mg'$  tablet. Take 1 tablet every 6 hours as needed for nausea/vomiting (This can make you drowsy).  If you are having persistent nausea (nausea that does not stop) please call the [facility] and let us know the amount of nausea that you are experiencing.  If you begin to vomit, you need to call the [facility] and if it is the weekend and you have vomited more than one time and can't get it to stop-go to the Emergency Room.  Persistent nausea/vomiting can lead to dehydration (loss of fluid in your body) and will make you feel very weak and unwell. Ice chips, sips of clear liquids, foods that are at room temperature, crackers, and toast tend to be better tolerated.   SYMPTOMS TO REPORT AS SOON AS POSSIBLE AFTER TREATMENT:  FEVER GREATER THAN 100.4 F  CHILLS WITH OR WITHOUT FEVER  NAUSEA AND VOMITING THAT IS NOT CONTROLLED WITH YOUR NAUSEA MEDICATION  UNUSUAL SHORTNESS OF BREATH  UNUSUAL BRUISING OR BLEEDING  TENDERNESS IN MOUTH AND THROAT WITH OR WITHOUT   PRESENCE OF ULCERS  URINARY PROBLEMS  BOWEL PROBLEMS  UNUSUAL RASH      Wear comfortable clothing and clothing appropriate for easy access to any Portacath or PICC line. Let us know if there is anything that we can do to make your therapy better!    What to do if you need assistance after hours or on the weekends: CALL [phone].  HOLD on the line, do not hang up.  You will hear multiple messages but at the end you will be connected with a nurse triage line.  They will contact the doctor if necessary.  Most of the time they will be able to assist you.  Do not call the hospital operator.      I have been informed and understand all of the instructions given to me and have received a copy. I have been  instructed to call the clinic [phone] or my family physician as soon as possible for continued medical care, if indicated. I do not have any more questions at this time but understand that I may call the [facility] or the Patient Navigator at [phone] during office hours should I have questions or need assistance in obtaining follow-up care.

## 2021-01-10 NOTE — Patient Instructions (Addendum)
[facility] at [facility] Discharge Instructions  You were seen today by Dr. Katragadda. He went over your recent results, and you received your treatment. Dr. Katragadda will see you back in 2 weeks for labs and follow up.   Thank you for choosing [facility] at [facility] to provide your oncology and hematology care.  To afford each patient quality time with our provider, please arrive at least 15 minutes before your scheduled appointment time.   If you have a lab appointment with the Cancer Center please come in thru the Main Entrance and check in at the main information desk  You need to re-schedule your appointment should you arrive 10 or more minutes late.  We strive to give you quality time with our providers, and arriving late affects you and other patients whose appointments are after yours.  Also, if you no show three or more times for appointments you may be dismissed from the clinic at the providers discretion.     Again, thank you for choosing [facility].  Our hope is that these requests will decrease the amount of time that you wait before being seen by our physicians.       _____________________________________________________________  Should you have questions after your visit to [facility], please contact our office at [phone] between the hours of 8:00 a.m. and 4:30 p.m.  Voicemails left after 4:00 p.m. will not be returned until the following business day.  For prescription refill requests, have your pharmacy contact our office and allow 72 hours.    Cancer Center Support Programs:   > Cancer Support Group  2nd Tuesday of the month 1pm-2pm, Journey Room   

## 2021-01-10 NOTE — Patient Instructions (Addendum)
[place]  Discharge Instructions: Thank you for choosing [facility] to provide your oncology and hematology care.  If you have a lab appointment with the [facility], please come in thru the Main Entrance and check in at the main information desk.  Wear comfortable clothing and clothing appropriate for easy access to any Portacath or PICC line.   We strive to give you quality time with your provider. You may need to reschedule your appointment if you arrive late (15 or more minutes).  Arriving late affects you and other patients whose appointments are after yours.  Also, if you miss three or more appointments without notifying the office, you may be dismissed from the clinic at the provider's discretion.      For prescription refill requests, have your pharmacy contact our office and allow 72 hours for refills to be completed.    Today you received the following chemotherapy and/or immunotherapy agents Folfirinox .       To help prevent nausea and vomiting after your treatment, we encourage you to take your nausea medication as directed.  BELOW ARE SYMPTOMS THAT SHOULD BE REPORTED IMMEDIATELY: *FEVER GREATER THAN 100.4 F (38 C) OR HIGHER *CHILLS OR SWEATING *NAUSEA AND VOMITING THAT IS NOT CONTROLLED WITH YOUR NAUSEA MEDICATION *UNUSUAL SHORTNESS OF BREATH *UNUSUAL BRUISING OR BLEEDING *URINARY PROBLEMS (pain or burning when urinating, or frequent urination) *BOWEL PROBLEMS (unusual diarrhea, constipation, pain near the anus) TENDERNESS IN MOUTH AND THROAT WITH OR WITHOUT PRESENCE OF ULCERS (sore throat, sores in mouth, or a toothache) UNUSUAL RASH, SWELLING OR PAIN  UNUSUAL VAGINAL DISCHARGE OR ITCHING   Items with * indicate a potential emergency and should be followed up as soon as possible or go to the Emergency Department if any problems should occur.  Please show the CHEMOTHERAPY ALERT CARD or IMMUNOTHERAPY ALERT CARD at check-in to the Emergency  Department and triage nurse.  Should you have questions after your visit or need to cancel or reschedule your appointment, please contact [facility] [phone]  and follow the prompts.  Office hours are 8:00 a.m. to 4:30 p.m. Monday - Friday. Please note that voicemails left after 4:00 p.m. may not be returned until the following business day.  We are closed weekends and major holidays. You have access to a nurse at all times for urgent questions. Please call the main number to the clinic [phone] and follow the prompts.  For any non-urgent questions, you may also contact your provider using MyChart. We now offer e-Visits for anyone 69 and older to request care online for non-urgent symptoms. For details visit [email or web address].   Also download the MyChart app! Go to the app store, search "MyChart", open the app, select [facility], and log in with your MyChart username and password.  Due to Covid, a mask is required upon entering the hospital/clinic. If you do not have a mask, one will be given to you upon arrival. For doctor visits, patients may have 1 support person aged 61 or older with them. For treatment visits, patients cannot have anyone with them due to current Covid guidelines and our immunocompromised population.    The chemotherapy medication bag should finish at 46 hours, 96 hours, or 7 days. For example, if your pump is scheduled for 46 hours and it was put on at 4:00 p.m., it should finish at 2:00 p.m. the day it is scheduled to come off regardless of your appointment time.  Estimated time to finish at 1330.   If the display on your pump reads "Low Volume" and it is beeping, take the batteries out of the pump and come to the cancer center for it to be taken off.   If the pump alarms go off prior to the pump reading "Low Volume" then call [phone] and someone can assist you.  If the plunger comes out and the chemotherapy medication is leaking out,  please use your home chemo spill kit to clean up the spill. Do NOT use paper towels or other household products.  If you have problems or questions regarding your pump, please call either [phone] (24 hours a day) or the cancer center Monday-Friday 8:00 a.m.- 4:30 p.m. at the clinic number and we will assist you. If you are unable to get assistance, then go to the nearest Emergency Department and ask the staff to contact the IV team for assistance.

## 2021-01-10 NOTE — Progress Notes (Signed)
Patient has been assessed, vital signs and labs have been reviewed by Dr. Katragadda. ANC, Creatinine, LFTs, and Platelets are within treatment parameters per Dr. Katragadda. The patient is good to proceed with treatment at this time. Primary RN and pharmacy aware.  

## 2021-01-10 NOTE — Progress Notes (Signed)
Patient presents today for C1D1 Folfirinox. Vital signs within parameters for today's treatment. Patient seen by [name] and labs reviewed. Patient has no complaints at this time.   5FU pump video viewed by patient.   Anastasio Champion RN at the bedside. Teaching performed and consent obtained.   Message received form [place] RN / [name] proceed with treatment.   Treatment given today per MD orders. Tolerated infusion without adverse affects. Vital signs stable. No complaints at this time. RUN  noted on the screen and verified with the patient. Discharged from clinic ambulatory in stable condition. Alert and oriented x 3. F/U with [facility] as scheduled.

## 2021-01-10 NOTE — Progress Notes (Signed)

## 2021-01-11 ENCOUNTER — Encounter (HOSPITAL_COMMUNITY): Payer: Self-pay | Admitting: Hematology

## 2021-01-12 ENCOUNTER — Inpatient Hospital Stay (HOSPITAL_COMMUNITY): Payer: BC Managed Care – PPO

## 2021-01-12 ENCOUNTER — Other Ambulatory Visit: Payer: Self-pay

## 2021-01-12 ENCOUNTER — Encounter (HOSPITAL_COMMUNITY): Payer: Self-pay

## 2021-01-12 VITALS — BP 129/74 | HR 84 | Temp 98.5°F | Resp 18

## 2021-01-12 DIAGNOSIS — K449 Diaphragmatic hernia without obstruction or gangrene: Secondary | ICD-10-CM | POA: Diagnosis not present

## 2021-01-12 DIAGNOSIS — M47816 Spondylosis without myelopathy or radiculopathy, lumbar region: Secondary | ICD-10-CM | POA: Diagnosis not present

## 2021-01-12 DIAGNOSIS — I10 Essential (primary) hypertension: Secondary | ICD-10-CM | POA: Diagnosis not present

## 2021-01-12 DIAGNOSIS — R5383 Other fatigue: Secondary | ICD-10-CM | POA: Diagnosis not present

## 2021-01-12 DIAGNOSIS — Z95828 Presence of other vascular implants and grafts: Secondary | ICD-10-CM

## 2021-01-12 DIAGNOSIS — K8021 Calculus of gallbladder without cholecystitis with obstruction: Secondary | ICD-10-CM | POA: Diagnosis not present

## 2021-01-12 DIAGNOSIS — M545 Low back pain, unspecified: Secondary | ICD-10-CM | POA: Diagnosis not present

## 2021-01-12 DIAGNOSIS — K611 Rectal abscess: Secondary | ICD-10-CM | POA: Diagnosis not present

## 2021-01-12 DIAGNOSIS — E785 Hyperlipidemia, unspecified: Secondary | ICD-10-CM | POA: Diagnosis not present

## 2021-01-12 DIAGNOSIS — C259 Malignant neoplasm of pancreas, unspecified: Secondary | ICD-10-CM

## 2021-01-12 DIAGNOSIS — R109 Unspecified abdominal pain: Secondary | ICD-10-CM | POA: Diagnosis not present

## 2021-01-12 DIAGNOSIS — Z5111 Encounter for antineoplastic chemotherapy: Secondary | ICD-10-CM | POA: Diagnosis not present

## 2021-01-12 DIAGNOSIS — E119 Type 2 diabetes mellitus without complications: Secondary | ICD-10-CM | POA: Diagnosis not present

## 2021-01-12 DIAGNOSIS — C250 Malignant neoplasm of head of pancreas: Secondary | ICD-10-CM | POA: Diagnosis not present

## 2021-01-12 DIAGNOSIS — I2510 Atherosclerotic heart disease of native coronary artery without angina pectoris: Secondary | ICD-10-CM | POA: Diagnosis not present

## 2021-01-12 DIAGNOSIS — M549 Dorsalgia, unspecified: Secondary | ICD-10-CM | POA: Diagnosis not present

## 2021-01-12 DIAGNOSIS — I700 Atherosclerosis of aorta: Secondary | ICD-10-CM | POA: Diagnosis not present

## 2021-01-12 DIAGNOSIS — N281 Cyst of kidney, acquired: Secondary | ICD-10-CM | POA: Diagnosis not present

## 2021-01-12 MED ORDER — PEGFILGRASTIM-CBQV 6 MG/0.6ML ~~LOC~~ SOSY
6.0000 mg | PREFILLED_SYRINGE | Freq: Once | SUBCUTANEOUS | Status: AC
  Administered 2021-01-12: 6 mg via SUBCUTANEOUS
  Filled 2021-01-12: qty 0.6

## 2021-01-12 MED ORDER — SODIUM CHLORIDE 0.9% FLUSH
10.0000 mL | INTRAVENOUS | Status: DC | PRN
Start: 2021-01-12 — End: 2021-01-12
  Administered 2021-01-12: 10 mL

## 2021-01-12 MED ORDER — HEPARIN SOD (PORK) LOCK FLUSH 100 UNIT/ML IV SOLN
500.0000 [IU] | Freq: Once | INTRAVENOUS | Status: AC | PRN
  Administered 2021-01-12: 500 [IU]

## 2021-01-12 NOTE — Patient Instructions (Signed)
[facility]  Discharge Instructions: Thank you for choosing [facility] to provide your oncology and hematology care.  If you have a lab appointment with the [facility], please come in thru the Main Entrance and check in at the main information desk.  Wear comfortable clothing and clothing appropriate for easy access to any Portacath or PICC line.   We strive to give you quality time with your provider. You may need to reschedule your appointment if you arrive late (15 or more minutes).  Arriving late affects you and other patients whose appointments are after yours.  Also, if you miss three or more appointments without notifying the office, you may be dismissed from the clinic at the provider's discretion.      For prescription refill requests, have your pharmacy contact our office and allow 72 hours for refills to be completed.    Today you received the following chemotherapy 5FU pump.       To help prevent nausea and vomiting after your treatment, we encourage you to take your nausea medication as directed.  BELOW ARE SYMPTOMS THAT SHOULD BE REPORTED IMMEDIATELY: *FEVER GREATER THAN 100.4 F (38 C) OR HIGHER *CHILLS OR SWEATING *NAUSEA AND VOMITING THAT IS NOT CONTROLLED WITH YOUR NAUSEA MEDICATION *UNUSUAL SHORTNESS OF BREATH *UNUSUAL BRUISING OR BLEEDING *URINARY PROBLEMS (pain or burning when urinating, or frequent urination) *BOWEL PROBLEMS (unusual diarrhea, constipation, pain near the anus) TENDERNESS IN MOUTH AND THROAT WITH OR WITHOUT PRESENCE OF ULCERS (sore throat, sores in mouth, or a toothache) UNUSUAL RASH, SWELLING OR PAIN  UNUSUAL VAGINAL DISCHARGE OR ITCHING   Items with * indicate a potential emergency and should be followed up as soon as possible or go to the Emergency Department if any problems should occur.  Please show the CHEMOTHERAPY ALERT CARD or IMMUNOTHERAPY ALERT CARD at check-in to the Emergency Department and triage  nurse.  Should you have questions after your visit or need to cancel or reschedule your appointment, please contact [facility] [phone]  and follow the prompts.  Office hours are 8:00 a.m. to 4:30 p.m. Monday - Friday. Please note that voicemails left after 4:00 p.m. may not be returned until the following business day.  We are closed weekends and major holidays. You have access to a nurse at all times for urgent questions. Please call the main number to the clinic [phone] and follow the prompts.  For any non-urgent questions, you may also contact your provider using MyChart. We now offer e-Visits for anyone 35 and older to request care online for non-urgent symptoms. For details visit [email or web address].   Also download the MyChart app! Go to the app store, search "MyChart", open the app, select [facility], and log in with your MyChart username and password.  Due to Covid, a mask is required upon entering the hospital/clinic. If you do not have a mask, one will be given to you upon arrival. For doctor visits, patients may have 1 support person aged 59 or older with them. For treatment visits, patients cannot have anyone with them due to current Covid guidelines and our immunocompromised population.

## 2021-01-12 NOTE — Progress Notes (Signed)
Patient presents today for pump d/c. Vital signs are stable. Port a cath site clean, dry, and intact. Port flushed with 10 mls of Normal Saline and 500 Units of Heparin. Needle removed intact. Band aid applied. Patient has no complaints at this time. Discharged from clinic ambulatory and in stable condition. Patient alert and oriented.   Alexander Banks presents today for injection per the provider's orders.  Udenyca administration without incident; injection site WNL; see MAR for injection details.  Patient tolerated procedure well and without incident.  No questions or complaints noted at this time.

## 2021-01-15 NOTE — Progress Notes (Signed)
01/15/21 24 hour call back: Patient states he is doing well and only complaints is fatigue on Saturday. Patient states today he has had some loose bowel movements and patient teaching performed pertaining to diarrhea management. Understanding verbalized. Overall, patient states, " I am doing well." Patient denies any significant changes since treatment.

## 2021-01-16 ENCOUNTER — Ambulatory Visit (INDEPENDENT_AMBULATORY_CARE_PROVIDER_SITE_OTHER): Payer: BC Managed Care – PPO | Admitting: Internal Medicine

## 2021-01-16 ENCOUNTER — Other Ambulatory Visit: Payer: Self-pay

## 2021-01-16 ENCOUNTER — Encounter (INDEPENDENT_AMBULATORY_CARE_PROVIDER_SITE_OTHER): Payer: Self-pay | Admitting: Internal Medicine

## 2021-01-16 VITALS — BP 103/66 | HR 86 | Temp 99.1°F | Ht 74.5 in | Wt 195.0 lb

## 2021-01-16 DIAGNOSIS — Z8719 Personal history of other diseases of the digestive system: Secondary | ICD-10-CM | POA: Diagnosis not present

## 2021-01-16 DIAGNOSIS — C259 Malignant neoplasm of pancreas, unspecified: Secondary | ICD-10-CM

## 2021-01-16 NOTE — Patient Instructions (Addendum)
Notify if you have fever or right lower quadrant pain lasting more than few few minutes

## 2021-01-16 NOTE — Progress Notes (Signed)
Presenting complaint;  Recent hospitalization for cholangitis secondary to stent migration. Patient with known history of pancreatic carcinoma.  Database and subjective:  Patient is 65-year-old Caucasian male who was discovered to have small pancreatic head mass when he was hospitalized in June 2022 for DKA and noted to have elevated transaminases.  He underwent ERCP with cytology and biliary stenting.  Cytology was negative.  He subsequently had EUS with F FNA confirming diagnosis of adenocarcinoma.  He was also seen by Dr. Faera Byerly of oncologic surgery.  Preop chemotherapy was recommended prior to Whipple procedure.  I saw patient on 12/21/2020 when he was doing well.  He was scheduled to undergo stent exchange on 12/27/2020 with 2 hours prior to his arriving in the hospital he developed fever and chills.  He was able to undergo urgent ERCP.  Previously placed plastic stent had migrated.  Fully covered Wallstent was placed on 12/27/2020.  He had rapid recovery and was able to go home on 12/30/2020. We felt plastic stent which was in the fourth part of the duodenum should pass spontaneously.  He had KUB on 01/03/2021 as planned. Patient received first cycle of chemotherapy on 01/10/2021. He states he is doing well.  He has noted decrease in his appetite but he is trying to eat 3 meals a day because he is trying to avoid hypoglycemic spells.  He has occasional fleeting pain in right upper quadrant which lasts for a second or 2.  He has not experienced nausea vomiting fever or chills.  Bowels move daily.  He denies melena or rectal bleeding.  He has not lost any weight since his last visit of 12/21/2020.  This year he has lost more than 50 pounds.   Current Medications: Outpatient Encounter Medications as of 01/16/2021  Medication Sig   Continuous Blood Gluc Receiver (FREESTYLE LIBRE 2 READER) DEVI As directed   Continuous Blood Gluc Sensor (FREESTYLE LIBRE 2 SENSOR) MISC 1 Piece by Does not apply route  every 14 (fourteen) days.   dronabinol (MARINOL) 5 MG capsule Take 1 capsule (5 mg total) by mouth 2 (two) times daily before a meal.   fluorouracil CALGB 80702 2,400 mg/m2 in sodium chloride 0.9 % 150 mL Inject 2,400 mg/m2 into the vein over 48 hr. Every 14 days   Glucosamine-Chondroitin (OSTEO BI-FLEX REGULAR STRENGTH PO) Take 2 tablets by mouth daily.   insulin detemir (LEVEMIR) 100 UNIT/ML FlexPen Inject 35 Units into the skin at bedtime.   insulin lispro (HUMALOG KWIKPEN) 100 UNIT/ML KwikPen Inject 10-16 Units into the skin 3 (three) times daily before meals.   IRINOTECAN HCL IV Inject 150 mg/m2 into the vein every 14 (fourteen) days.   IRON-VITAMIN C PO Take 1 tablet by mouth daily.   LEUCOVORIN CALCIUM IV Inject 400 mg/m2 into the vein every 14 (fourteen) days.   levothyroxine (SYNTHROID) 175 MCG tablet Take 1 tablet (175 mcg total) by mouth daily before breakfast.   lidocaine-prilocaine (EMLA) cream Apply a small amount to port a cath site (do not rub in) and cover with plastic wrap 1 hour prior to infusion appointments   Melatonin-Pyridoxine (MELATIN PO) Take by mouth. Takes 2 qhs   ondansetron (ZOFRAN) 8 MG tablet Take 1 tablet (8 mg total) by mouth 2 (two) times daily as needed. Start on day 3 after chemotherapy.   OXALIPLATIN IV Inject 85 mg/m2 into the vein every 14 (fourteen) days.   oxyCODONE-acetaminophen (PERCOCET/ROXICET) 5-325 MG tablet Take 1 tablet by mouth every 6 (six) hours as   needed for severe pain.   prochlorperazine (COMPAZINE) 10 MG tablet Take 10 mg by mouth every 6 (six) hours as needed for nausea or vomiting.   No facility-administered encounter medications on file as of 01/16/2021.     Objective: Blood pressure 103/66, pulse 86, temperature 99.1 F (37.3 C), temperature source Oral, height 6' 2.5" (1.892 m), weight 195 lb (88.5 kg). Patient is alert and in no acute distress. Conjunctiva is pink. Sclera is nonicteric Oropharyngeal mucosa is normal. No neck  masses or thyromegaly noted. Cardiac exam with regular rhythm normal S1 and S2. No murmur or gallop noted. Lungs are clear to auscultation. Abdomen is soft and nontender with organomegaly or masses. No LE edema or clubbing noted.  Labs/studies Results:   CBC Latest Ref Rng & Units 01/10/2021 12/29/2020 12/28/2020  WBC 4.0 - 10.5 K/uL 7.2 11.6(H) 19.5(H)  Hemoglobin 13.0 - 17.0 g/dL 12.4(L) 10.9(L) 11.8(L)  Hematocrit 39.0 - 52.0 % 39.2 33.6(L) 37.5(L)  Platelets 150 - 400 K/uL 449(H) 160 177    CMP Latest Ref Rng & Units 01/10/2021 12/30/2020 12/29/2020  Glucose 70 - 99 mg/dL 219(H) 216(H) 189(H)  BUN 8 - 23 mg/dL 15 15 24(H)  Creatinine 0.61 - 1.24 mg/dL 0.84 0.88 0.95  Sodium 135 - 145 mmol/L 135 136 136  Potassium 3.5 - 5.1 mmol/L 4.2 4.5 3.9  Chloride 98 - 111 mmol/L 105 102 106  CO2 22 - 32 mmol/L 24 29 25  Calcium 8.9 - 10.3 mg/dL 8.4(L) 8.4(L) 7.8(L)  Total Protein 6.5 - 8.1 g/dL 6.5 5.6(L) 4.9(L)  Total Bilirubin 0.3 - 1.2 mg/dL 0.8 1.2 1.2  Alkaline Phos 38 - 126 U/L 120 236(H) 220(H)  AST 15 - 41 U/L 15 21 39  ALT 0 - 44 U/L 16 54(H) 71(H)    Hepatic Function Latest Ref Rng & Units 01/10/2021 12/30/2020 12/29/2020  Total Protein 6.5 - 8.1 g/dL 6.5 5.6(L) 4.9(L)  Albumin 3.5 - 5.0 g/dL 3.1(L) 2.3(L) 2.1(L)  AST 15 - 41 U/L 15 21 39  ALT 0 - 44 U/L 16 54(H) 71(H)  Alk Phosphatase 38 - 126 U/L 120 236(H) 220(H)  Total Bilirubin 0.3 - 1.2 mg/dL 0.8 1.2 1.2  Bilirubin, Direct 0.0 - 0.2 mg/dL - - -    Lab data from 01/11/2019 reviewed.  KUB from 01/03/2021 reviewed Biliary Wallstent appears to be in good position. Plastic stent is not seen.  Assessment:  #1.  Recent hospitalization acute cholangitis secondary to migration of biliary stent.  He has passed plastic biliary spontaneously.  #2.  Pancreatic carcinoma resulting in CBD stricture initially treated with plastic stent and now he has fully covered biliary stent which appears functioning as transaminases have  normalized. Patient received first cycle of chemotherapy 6 days ago.  Next cycle due in 8 days. He is to follow-up with Dr. Byerly in 3 months.  Plan:  Patient advised to call office if he has right upper quadrant pain fever or chills. I would like to see him back after he has completed chemotherapy and surgery.       

## 2021-01-22 ENCOUNTER — Other Ambulatory Visit: Payer: Self-pay | Admitting: Family Medicine

## 2021-01-23 NOTE — Progress Notes (Signed)
Alexander Banks, Alexander Banks   CLINIC:  Medical Oncology/Hematology  PCP:  [name], MD [address] [place] / [place] [address] [phone]   REASON FOR VISIT:  Follow-up for pancreatic adenocarcinoma  PRIOR THERAPY: none  NGS Results: not done  CURRENT THERAPY: FOLFIRINOX every 2 weeks x 4 cycles  BRIEF ONCOLOGIC HISTORY:  Oncology History  Pancreatic adenocarcinoma ([place])  12/04/2020 Initial Diagnosis   Pancreatic adenocarcinoma ([place])   01/10/2021 -  Chemotherapy    Patient is on Treatment Plan: PANCREAS MODIFIED FOLFIRINOX Q14D X 4 CYCLES         CANCER STAGING: Cancer Staging Pancreatic adenocarcinoma ([place]) Staging form: Exocrine Pancreas, AJCC 8th Edition - Clinical stage from 12/04/2020: Stage IB (cT2, cN0, cM0) - Unsigned   INTERVAL HISTORY:  Alexander Banks, a 65 y.o. male, returns for routine follow-up and consideration for next cycle of chemotherapy. Alexander Banks was last seen on 01/10/2021.  Due for cycle #2 of FOLFIRINOX today.   Overall, he tells me he has been feeling pretty well. He reports fatigue on 08/27 following treatment. He denies nausea and vomiting, and he reports 1 day of water diarrhea for which he took Imodium. Following taking Imodium he had a day of constipation. He currently reports low caliber stools. He has cold-sensitivity for 5 days following treatment. He denies tingling and numbness, and stable back pain. He has not had to take percocet since his last visit. He reports intermittently low blood sugar since treatment, and he reports occasional abdominal cramping that has been present prior to starting chemotherapy.  Overall, he feels ready for next cycle of chemo today.   REVIEW OF SYSTEMS:  Review of Systems  Constitutional:  Negative for appetite change (50%) and fatigue (50%).  Gastrointestinal:  Positive for abdominal pain, constipation and diarrhea. Negative for nausea and  vomiting.  Musculoskeletal:  Positive for back pain (2/10).  Neurological:  Negative for numbness.  All other systems reviewed and are negative.  PAST MEDICAL/SURGICAL HISTORY:  Past Medical History:  Diagnosis Date   Anemia, iron deficiency    Diabetes mellitus without complication ([place])    on meds   Hearing loss    Bil hearing aids   Hyperlipidemia    Hypertension    Port-A-Cath in place 12/26/2020   Prediabetes    Sleep apnea    Past Surgical History:  Procedure Laterality Date   BILIARY STENT PLACEMENT N/A 11/09/2020   Procedure: BILIARY STENT PLACEMENT - 63 F X 5 CM STENT;  Surgeon: Rogene Houston, MD;  Location: [facility];  Service: Endoscopy;  Laterality: N/A;   BILIARY STENT PLACEMENT N/A 12/27/2020   Procedure: BILIARY STENT PLACEMENT;  Surgeon: Rogene Houston, MD;  Location: [facility];  Service: Endoscopy;  Laterality: N/A;   COLONOSCOPY     ERCP N/A 11/09/2020   Procedure: ENDOSCOPIC RETROGRADE CHOLANGIOPANCREATOGRAPHY (ERCP);  Surgeon: Rogene Houston, MD;  Location: [facility];  Service: Endoscopy;  Laterality: N/A;   ERCP N/A 12/27/2020   Procedure: ENDOSCOPIC RETROGRADE CHOLANGIOPANCREATOGRAPHY (ERCP);  Surgeon: Rogene Houston, MD;  Location: [facility];  Service: Endoscopy;  Laterality: N/A;   ESOPHAGOGASTRODUODENOSCOPY (EGD) WITH PROPOFOL N/A 11/30/2020   Procedure: ESOPHAGOGASTRODUODENOSCOPY (EGD) WITH PROPOFOL;  Surgeon: Milus Banister, MD;  Location: [facility];  Service: Endoscopy;  Laterality: N/A;   EUS N/A 11/30/2020   Procedure: UPPER ENDOSCOPIC ULTRASOUND (EUS) RADIAL;  Surgeon: Milus Banister, MD;  Location: [facility];  Service: Endoscopy;  Laterality: N/A;   FINE NEEDLE ASPIRATION N/A 11/30/2020   Procedure: FINE NEEDLE ASPIRATION (FNA) LINEAR;  Surgeon: Milus Banister, MD;  Location: [facility];  Service: Endoscopy;  Laterality: N/A;   GIVENS CAPSULE STUDY     IR IMAGING GUIDED PORT INSERTION  12/18/2020   KNEE SURGERY     4 surgeries on left, 2 on right  knee   SHOULDER SURGERY     right shoulder   SPHINCTEROTOMY N/A 11/09/2020   Procedure: SHORT BILIARY SPHINCTEROTOMY;  Surgeon: Rogene Houston, MD;  Location: [facility];  Service: Endoscopy;  Laterality: N/A;    SOCIAL HISTORY:  Social History   Socioeconomic History   Marital status: Married    Spouse name: Not on file   Number of children: Not on file   Years of education: Not on file   Highest education level: Not on file  Occupational History   Not on file  Tobacco Use   Smoking status: Every Day    Types: Cigars   Smokeless tobacco: Never   Tobacco comments:    Smoke 3-4 cigars a day  Vaping Use   Vaping Use: Never used  Substance and Sexual Activity   Alcohol use: Yes    Comment: occasional   Drug use: Never   Sexual activity: Not on file  Other Topics Concern   Not on file  Social History Narrative   Not on file   Social Determinants of Health   Financial Resource Strain: Low Risk    Difficulty of Paying Living Expenses: Not hard at all  Food Insecurity: No Food Insecurity   Worried About Charity fundraiser in the Last Year: Never true   [place] in the Last Year: Never true  Transportation Needs: No Transportation Needs   Lack of Transportation (Medical): No   Lack of Transportation (Non-Medical): No  Physical Activity: Sufficiently Active   Days of Exercise per Week: 5 days   Minutes of Exercise per Session: 30 min  Stress: Not on file  Social Connections: Not on file  Intimate Partner Violence: Not At Risk   Fear of Current or Ex-Partner: No   Emotionally Abused: No   Physically Abused: No   Sexually Abused: No    FAMILY HISTORY:  Family History  Problem Relation Age of Onset   Heart attack Father    Heart attack Paternal Grandfather    Leukemia Sister    Heart attack Brother     CURRENT MEDICATIONS:  Current Outpatient Medications  Medication Sig Dispense Refill   Continuous Blood Gluc Receiver (FREESTYLE LIBRE 2 READER) DEVI As  directed 1 each 0   Continuous Blood Gluc Sensor (FREESTYLE LIBRE 2 SENSOR) MISC 1 Piece by Does not apply route every 14 (fourteen) days. 6 each 2   dronabinol (MARINOL) 5 MG capsule Take 1 capsule (5 mg total) by mouth 2 (two) times daily before a meal. 60 capsule 2   fluorouracil CALGB 63875 2,400 mg/m2 in sodium chloride 0.9 % 150 mL Inject 2,400 mg/m2 into the vein over 48 hr. Every 14 days     Glucosamine-Chondroitin (OSTEO BI-FLEX REGULAR STRENGTH PO) Take 2 tablets by mouth daily.     insulin detemir (LEVEMIR) 100 UNIT/ML FlexPen Inject 35 Units into the skin at bedtime. 45 mL 0   insulin lispro (HUMALOG KWIKPEN) 100 UNIT/ML KwikPen Inject 10-16 Units into the skin 3 (three) times daily before meals. 45 mL 1   IRINOTECAN HCL IV  Inject 150 mg/m2 into the vein every 14 (fourteen) days.     IRON-VITAMIN C PO Take 1 tablet by mouth daily.     LEUCOVORIN CALCIUM IV Inject 400 mg/m2 into the vein every 14 (fourteen) days.     levothyroxine (SYNTHROID) 175 MCG tablet Take 1 tablet (175 mcg total) by mouth daily before breakfast.     lidocaine-prilocaine (EMLA) cream Apply a small amount to port a cath site (do not rub in) and cover with plastic wrap 1 hour prior to infusion appointments 30 g 3   Melatonin-Pyridoxine (MELATIN PO) Take by mouth. Takes 2 qhs     ondansetron (ZOFRAN) 8 MG tablet Take 1 tablet (8 mg total) by mouth 2 (two) times daily as needed. Start on day 3 after chemotherapy. 30 tablet 1   OXALIPLATIN IV Inject 85 mg/m2 into the vein every 14 (fourteen) days.     oxyCODONE-acetaminophen (PERCOCET/ROXICET) 5-325 MG tablet Take 1 tablet by mouth every 6 (six) hours as needed for severe pain.     prochlorperazine (COMPAZINE) 10 MG tablet Take 10 mg by mouth every 6 (six) hours as needed for nausea or vomiting.     No current facility-administered medications for this visit.    ALLERGIES:  Allergies  Allergen Reactions   Lisinopril Cough    PHYSICAL EXAM:  Performance status  (ECOG): 1 - Symptomatic but completely ambulatory  There were no vitals filed for this visit. Wt Readings from Last 3 Encounters:  01/16/21 195 lb (88.5 kg)  01/10/21 195 lb 1.6 oz (88.5 kg)  01/05/21 197 lb 3.2 oz (89.4 kg)   Physical Exam Vitals reviewed.  Constitutional:      Appearance: Normal appearance.  HENT:     Mouth/Throat:     Mouth: No oral lesions.  Cardiovascular:     Rate and Rhythm: Normal rate and regular rhythm.     Pulses: Normal pulses.     Heart sounds: Normal heart sounds.  Pulmonary:     Effort: Pulmonary effort is normal.     Breath sounds: Normal breath sounds.  Neurological:     General: No focal deficit present.     Mental Status: He is alert and oriented to person, place, and time.  Psychiatric:        Mood and Affect: Mood normal.        Behavior: Behavior normal.    LABORATORY DATA:  I have reviewed the labs as listed.  CBC Latest Ref Rng & Units 01/10/2021 12/29/2020 12/28/2020  WBC 4.0 - 10.5 K/uL 7.2 11.6(H) 19.5(H)  Hemoglobin 13.0 - 17.0 g/dL 12.4(L) 10.9(L) 11.8(L)  Hematocrit 39.0 - 52.0 % 39.2 33.6(L) 37.5(L)  Platelets 150 - 400 K/uL 449(H) 160 177   CMP Latest Ref Rng & Units 01/10/2021 12/30/2020 12/29/2020  Glucose 70 - 99 mg/dL 219(H) 216(H) 189(H)  BUN 8 - 23 mg/dL 15 15 24(H)  Creatinine 0.61 - 1.24 mg/dL 0.84 0.88 0.95  Sodium 135 - 145 mmol/L 135 136 136  Potassium 3.5 - 5.1 mmol/L 4.2 4.5 3.9  Chloride 98 - 111 mmol/L 105 102 106  CO2 22 - 32 mmol/L '24 29 25  '$ Calcium 8.9 - 10.3 mg/dL 8.4(L) 8.4(L) 7.8(L)  Total Protein 6.5 - 8.1 g/dL 6.5 5.6(L) 4.9(L)  Total Bilirubin 0.3 - 1.2 mg/dL 0.8 1.2 1.2  Alkaline Phos 38 - 126 U/L 120 236(H) 220(H)  AST 15 - 41 U/L 15 21 39  ALT 0 - 44 U/L 16 54(H) 71(H)  DIAGNOSTIC IMAGING:  I have independently reviewed the scans and discussed with the patient. DG Abd 1 View  Result Date: 01/09/2021 CLINICAL DATA:  Biliary stent placement EXAM: ABDOMEN - 1 VIEW COMPARISON:  12/27/2020  FINDINGS: Metallic internal biliary stent overlies its expected position within the right paraspinal region and demonstrates interval expansion since prior examination. Previously noted silastic internal biliary stent likely within the enteric tract is no longer visualized and high Ki has passed in the interval. Normal abdominal gas pattern. Degenerative changes are seen within the lumbar spine. Vascular calcifications noted within the pelvis. IMPRESSION: Metallic biliary stent in expected position with interval expansion to near nominal diameter since prior examination. Electronically Signed   By: Fidela Salisbury M.D.   On: 01/09/2021 21:05   X-ray abdomen [facility]  Result Date: 12/27/2020 CLINICAL DATA:  Pancreatic cancer post stent replacement. The original stent could not be located during ERCP. EXAM: ABDOMEN - 1 VIEW COMPARISON:  CT 12/05/2020, PET-CT 12/14/2020 and images from ERCP done earlier today. FINDINGS: New metallic biliary stent in place with waisting in its mid section. The preexistent plastic biliary stent overlaps the left paraspinal region, displaced from its location on recent CT and PET, presumably intraluminal within the proximal small bowel. No supine evidence of free intraperitoneal air or bowel obstruction. Mild degenerative changes are present within the lumbar spine. IMPRESSION: 1. The plastic biliary stent is displaced from its recent location, overlapping the left paraspinal region, presumably within the proximal small bowel. 2. New biliary stent appears satisfactorily positioned. Electronically Signed   By: Richardean Sale M.D.   On: 12/27/2020 15:41   DG Chest Port 1 View  Result Date: 12/27/2020 CLINICAL DATA:  Fever. EXAM: PORTABLE CHEST 1 VIEW COMPARISON:  March 13, 2007. FINDINGS: The heart size and mediastinal contours are within normal limits. Both lungs are clear. Right internal jugular Port-A-Cath is noted with distal tip in expected position of the SVC. The visualized  skeletal structures are unremarkable. IMPRESSION: No active disease. Electronically Signed   By: Marijo Conception M.D.   On: 12/27/2020 12:14   DG ERCP  Result Date: 12/27/2020 CLINICAL DATA:  ERCP with stent replacement.  Pancreatic cancer. EXAM: ERCP TECHNIQUE: Multiple spot images obtained with the fluoroscopic device and submitted for interpretation post-procedure. FLUOROSCOPY TIME:  Fluoroscopy Time:  2 minutes and 16 seconds Radiation Exposure Index (if provided by the fluoroscopic device): 46.4 mGy Number of Acquired Spot Images: 0 COMPARISON:  Abdominal CT 12/05/2020 and PET-CT 12/14/2020. FINDINGS: The submitted images demonstrate moderate dilatation of the common hepatic duct and mild intrahepatic biliary dilatation. There is mild wall irregularity of the common hepatic duct, but no discrete stones are seen. There is high-grade focal narrowing of the common bile duct at the level of the pancreatic head. Final images demonstrate the placement of a metallic biliary stent across the common bile duct with waisting within the pancreatic head. There is decompression of the biliary system on the final image. The plastic biliary stent is visible on some of the images overlapping the spine, better seen on the postoperative abdominal radiograph. IMPRESSION: Images during biliary stent exchange for pancreatic cancer as described. The known displaced plastic biliary stent is partially visualized, better seen on postoperative radiographs. These images were submitted for radiologic interpretation only. Please see the procedural report for the amount of contrast and the fluoroscopy time utilized. Electronically Signed   By: Richardean Sale M.D.   On: 12/27/2020 15:49     ASSESSMENT:  1.  Stage I (T2N0) pancreatic adenocarcinoma: - Recent admission to the hospital with DKA, found to have elevated liver enzymes. - MRCP without contrast on 11/08/2020 showed 2.3 x 2 cm central pancreatic head and uncinate mass with  intra and extrahepatic biliary ductal dilation. - 11/09/2020-ERCP sphincterotomy and stenting with findings showing short high-grade distal CBD stricture with dilated CBD up to 14 mm.  Bile duct brushing was negative for malignant cells. - 11/10/2020-CA 19.9-367. - ERCP/EUS by [name] on 11/30/2020 with 2.9 cm mass in the head of the pancreas with unclear outer borders.  Given significant dilatation, indistinct borders, unable to confidently assess major vascular involvement.  Abutment and probable invasion of SMV and portal vein near the confluence with splenic vein.  No obvious SMA or celiac artery involvement.  No other adenopathy. - FNA of the pancreatic mass consistent with adenocarcinoma. - Weight loss intentional 25 pounds between January and April 1.  Additional 25 pound weight loss since 1 April, unintentional. - CT [facility] pancreatic protocol on 12/05/2020 with ill-defined mass in the pancreatic head and uncinate process measuring up to 1.9 x 2.1 cm with associated pancreatic ductal dilatation up to 8 mm.  No enlarged adenopathy.  Teardrop morphology of SMV indicative of SMV involvement.  Replaced right hepatic arterial supply arises from the SMA passing immediately posterior to the tumor. - PET scan on 12/14/2020 with mass in the pancreatic head with hypermetabolic features, SUV 5.3 with no sign of metastatic disease. - Neoadjuvant FOLFIRINOX started on 01/10/2021.  2.  Social/family history: - He lives at home with his wife and is independent of all ADLs and IADLs. - He currently works in [employer].  No history of chemical exposure. - He previously smoked cigarettes.  Currently smokes cigars 4/day. - Maternal grandmother died at age 32 with malignancy, unknown type. - Sister died of acute leukemia.   PLAN:  1.  Stage I (T2N0) pancreatic adenocarcinoma: - He has tolerated first cycle of FOLFIRINOX reasonably well. - He denies any nausea or vomiting.  Denies any tingling or numbness in the  extremities. - He lost about 3 pounds.  Had occasional abdominal pain, stent related. - Reviewed labs today which showed alkaline phosphatase elevated at 142.  Rest of the LFTs are normal.  CBC and electrolytes were grossly normal.  CA 19-9 was 396. - He will proceed with cycle 2 of FOLFIRINOX without any dose modifications. - RTC 2 weeks for follow-up.  2.  Weight loss: - Continue Marinol 5 mg twice daily.  He lost 3 pounds in the last 2 weeks.  3.  Diabetes: - Continue Humalog 10 to 12 units 3 times daily with meals.  4.  Paraspinal back pain: - Continue Percocet as needed.  Likely stent related.   Orders placed this encounter:  No orders of the defined types were placed in this encounter.    Derek Jack, MD [place] [phone]   I, Thana Ates, am acting as a scribe for Dr. Derek Jack.  I, Derek Jack MD, have reviewed the above documentation for accuracy and completeness, and I agree with the above.

## 2021-01-24 ENCOUNTER — Inpatient Hospital Stay (HOSPITAL_BASED_OUTPATIENT_CLINIC_OR_DEPARTMENT_OTHER): Payer: BC Managed Care – PPO | Admitting: Hematology

## 2021-01-24 ENCOUNTER — Inpatient Hospital Stay (HOSPITAL_COMMUNITY): Payer: BC Managed Care – PPO

## 2021-01-24 ENCOUNTER — Encounter (HOSPITAL_COMMUNITY): Payer: Self-pay | Admitting: Hematology

## 2021-01-24 ENCOUNTER — Other Ambulatory Visit: Payer: Self-pay

## 2021-01-24 ENCOUNTER — Inpatient Hospital Stay (HOSPITAL_COMMUNITY): Payer: BC Managed Care – PPO | Attending: Hematology and Oncology

## 2021-01-24 VITALS — BP 107/65 | HR 100 | Temp 97.0°F | Resp 18 | Wt 192.8 lb

## 2021-01-24 VITALS — BP 117/63 | HR 79 | Temp 97.0°F | Resp 18

## 2021-01-24 DIAGNOSIS — C259 Malignant neoplasm of pancreas, unspecified: Secondary | ICD-10-CM

## 2021-01-24 DIAGNOSIS — C250 Malignant neoplasm of head of pancreas: Secondary | ICD-10-CM | POA: Insufficient documentation

## 2021-01-24 DIAGNOSIS — R634 Abnormal weight loss: Secondary | ICD-10-CM | POA: Insufficient documentation

## 2021-01-24 DIAGNOSIS — Z806 Family history of leukemia: Secondary | ICD-10-CM | POA: Diagnosis not present

## 2021-01-24 DIAGNOSIS — Z5189 Encounter for other specified aftercare: Secondary | ICD-10-CM | POA: Diagnosis not present

## 2021-01-24 DIAGNOSIS — Z95828 Presence of other vascular implants and grafts: Secondary | ICD-10-CM

## 2021-01-24 DIAGNOSIS — Z79899 Other long term (current) drug therapy: Secondary | ICD-10-CM | POA: Insufficient documentation

## 2021-01-24 DIAGNOSIS — Z5111 Encounter for antineoplastic chemotherapy: Secondary | ICD-10-CM | POA: Diagnosis not present

## 2021-01-24 DIAGNOSIS — R109 Unspecified abdominal pain: Secondary | ICD-10-CM | POA: Diagnosis not present

## 2021-01-24 DIAGNOSIS — R5383 Other fatigue: Secondary | ICD-10-CM | POA: Diagnosis not present

## 2021-01-24 DIAGNOSIS — E119 Type 2 diabetes mellitus without complications: Secondary | ICD-10-CM | POA: Diagnosis not present

## 2021-01-24 DIAGNOSIS — Z8249 Family history of ischemic heart disease and other diseases of the circulatory system: Secondary | ICD-10-CM | POA: Insufficient documentation

## 2021-01-24 DIAGNOSIS — F17210 Nicotine dependence, cigarettes, uncomplicated: Secondary | ICD-10-CM | POA: Insufficient documentation

## 2021-01-24 DIAGNOSIS — M5489 Other dorsalgia: Secondary | ICD-10-CM | POA: Diagnosis not present

## 2021-01-24 DIAGNOSIS — K5900 Constipation, unspecified: Secondary | ICD-10-CM | POA: Insufficient documentation

## 2021-01-24 DIAGNOSIS — R197 Diarrhea, unspecified: Secondary | ICD-10-CM | POA: Insufficient documentation

## 2021-01-24 LAB — CBC WITH DIFFERENTIAL/PLATELET
Abs Immature Granulocytes: 0.22 10*3/uL — ABNORMAL HIGH (ref 0.00–0.07)
Basophils Absolute: 0.1 10*3/uL (ref 0.0–0.1)
Basophils Relative: 1 %
Eosinophils Absolute: 0.1 10*3/uL (ref 0.0–0.5)
Eosinophils Relative: 1 %
HCT: 38.7 % — ABNORMAL LOW (ref 39.0–52.0)
Hemoglobin: 12.3 g/dL — ABNORMAL LOW (ref 13.0–17.0)
Immature Granulocytes: 2 %
Lymphocytes Relative: 10 %
Lymphs Abs: 1.4 10*3/uL (ref 0.7–4.0)
MCH: 30.1 pg (ref 26.0–34.0)
MCHC: 31.8 g/dL (ref 30.0–36.0)
MCV: 94.6 fL (ref 80.0–100.0)
Monocytes Absolute: 1.1 10*3/uL — ABNORMAL HIGH (ref 0.1–1.0)
Monocytes Relative: 8 %
Neutro Abs: 10.4 10*3/uL — ABNORMAL HIGH (ref 1.7–7.7)
Neutrophils Relative %: 78 %
Platelets: 217 10*3/uL (ref 150–400)
RBC: 4.09 MIL/uL — ABNORMAL LOW (ref 4.22–5.81)
RDW: 14.8 % (ref 11.5–15.5)
WBC: 13.2 10*3/uL — ABNORMAL HIGH (ref 4.0–10.5)
nRBC: 0 % (ref 0.0–0.2)

## 2021-01-24 LAB — COMPREHENSIVE METABOLIC PANEL
ALT: 13 U/L (ref 0–44)
AST: 14 U/L — ABNORMAL LOW (ref 15–41)
Albumin: 2.9 g/dL — ABNORMAL LOW (ref 3.5–5.0)
Alkaline Phosphatase: 142 U/L — ABNORMAL HIGH (ref 38–126)
Anion gap: 6 (ref 5–15)
BUN: 14 mg/dL (ref 8–23)
CO2: 22 mmol/L (ref 22–32)
Calcium: 7.8 mg/dL — ABNORMAL LOW (ref 8.9–10.3)
Chloride: 107 mmol/L (ref 98–111)
Creatinine, Ser: 0.87 mg/dL (ref 0.61–1.24)
GFR, Estimated: >60 mL/min (ref >60)
Glucose, Bld: 230 mg/dL — ABNORMAL HIGH (ref 70–99)
Potassium: 3.8 mmol/L (ref 3.5–5.1)
Sodium: 135 mmol/L (ref 135–145)
Total Bilirubin: 0.3 mg/dL (ref 0.3–1.2)
Total Protein: 5.9 g/dL — ABNORMAL LOW (ref 6.5–8.1)

## 2021-01-24 LAB — MAGNESIUM: Magnesium: 1.7 mg/dL (ref 1.7–2.4)

## 2021-01-24 MED ORDER — SODIUM CHLORIDE 0.9 % IV SOLN
2350.0000 mg/m2 | INTRAVENOUS | Status: DC
  Administered 2021-01-24: 5000 mg via INTRAVENOUS
  Filled 2021-01-24: qty 100

## 2021-01-24 MED ORDER — SODIUM CHLORIDE 0.9 % IV SOLN
150.0000 mg/m2 | Freq: Once | INTRAVENOUS | Status: AC
  Administered 2021-01-24: 320 mg via INTRAVENOUS
  Filled 2021-01-24: qty 15

## 2021-01-24 MED ORDER — SODIUM CHLORIDE 0.9 % IV SOLN
400.0000 mg/m2 | Freq: Once | INTRAVENOUS | Status: AC
  Administered 2021-01-24: 852 mg via INTRAVENOUS
  Filled 2021-01-24: qty 42.6

## 2021-01-24 MED ORDER — SODIUM CHLORIDE 0.9 % IV SOLN: Freq: Once | INTRAVENOUS | Status: AC

## 2021-01-24 MED ORDER — DEXTROSE 5 % IV SOLN: Freq: Once | INTRAVENOUS | Status: AC

## 2021-01-24 MED ORDER — ATROPINE SULFATE 1 MG/ML IJ SOLN
0.5000 mg | Freq: Once | INTRAMUSCULAR | Status: AC | PRN
  Administered 2021-01-24: 0.5 mg via INTRAVENOUS
  Filled 2021-01-24: qty 1

## 2021-01-24 MED ORDER — SODIUM CHLORIDE 0.9 % IV SOLN: INTRAVENOUS | Status: DC

## 2021-01-24 MED ORDER — PALONOSETRON HCL INJECTION 0.25 MG/5ML
0.2500 mg | Freq: Once | INTRAVENOUS | Status: AC
  Administered 2021-01-24: 0.25 mg via INTRAVENOUS
  Filled 2021-01-24: qty 5

## 2021-01-24 MED ORDER — SODIUM CHLORIDE 0.9 % IV SOLN
150.0000 mg | Freq: Once | INTRAVENOUS | Status: AC
  Administered 2021-01-24: 150 mg via INTRAVENOUS
  Filled 2021-01-24: qty 150

## 2021-01-24 MED ORDER — OXALIPLATIN CHEMO INJECTION 100 MG/20ML
85.0000 mg/m2 | Freq: Once | INTRAVENOUS | Status: AC
  Administered 2021-01-24: 180 mg via INTRAVENOUS
  Filled 2021-01-24: qty 36

## 2021-01-24 MED ORDER — SODIUM CHLORIDE 0.9 % IV SOLN
10.0000 mg | Freq: Once | INTRAVENOUS | Status: AC
  Administered 2021-01-24: 10 mg via INTRAVENOUS
  Filled 2021-01-24: qty 10

## 2021-01-24 NOTE — Patient Instructions (Signed)
[facility] at [facility] Discharge Instructions  You were seen today by Dr. Katragadda. He went over your recent results, and you received your treatment. Dr. Katragadda will see you back in 2 weeks for labs and follow up.   Thank you for choosing [facility] at [facility] to provide your oncology and hematology care.  To afford each patient quality time with our provider, please arrive at least 15 minutes before your scheduled appointment time.   If you have a lab appointment with the Cancer Center please come in thru the Main Entrance and check in at the main information desk  You need to re-schedule your appointment should you arrive 10 or more minutes late.  We strive to give you quality time with our providers, and arriving late affects you and other patients whose appointments are after yours.  Also, if you no show three or more times for appointments you may be dismissed from the clinic at the providers discretion.     Again, thank you for choosing [facility].  Our hope is that these requests will decrease the amount of time that you wait before being seen by our physicians.       _____________________________________________________________  Should you have questions after your visit to [facility], please contact our office at [phone] between the hours of 8:00 a.m. and 4:30 p.m.  Voicemails left after 4:00 p.m. will not be returned until the following business day.  For prescription refill requests, have your pharmacy contact our office and allow 72 hours.    Cancer Center Support Programs:   > Cancer Support Group  2nd Tuesday of the month 1pm-2pm, Journey Room   

## 2021-01-24 NOTE — Progress Notes (Signed)
Patient presents today for chemotherapy infusion.  Labs reviewed by [name] during [place].  All labs are within treatment parameters.  Proceed with treatment per [name].   Patient tolerated treatment well with no complaints voiced.  Patient left ambulatory in stable condition.  Vital signs stable at discharge.  Follow up as scheduled.      The chemotherapy medication bag should finish at 46 hours, 96 hours, or 7 days. For example, if your pump is scheduled for 46 hours and it was put on at 4:00 p.m., it should finish at 2:00 p.m. the day it is scheduled to come off regardless of your appointment time.     Estimated time to finish at 1330.   If the display on your pump reads "Low Volume" and it is beeping, take the batteries out of the pump and come to the cancer center for it to be taken off.   If the pump alarms go off prior to the pump reading "Low Volume" then call [phone] and someone can assist you.  If the plunger comes out and the chemotherapy medication is leaking out, please use your home chemo spill kit to clean up the spill. Do NOT use paper towels or other household products.  If you have problems or questions regarding your pump, please call either [phone] (24 hours a day) or the cancer center Monday-Friday 8:00 a.m.- 4:30 p.m. at the clinic number and we will assist you. If you are unable to get assistance, then go to the nearest Emergency Department and ask the staff to contact the IV team for assistance.

## 2021-01-24 NOTE — Progress Notes (Signed)
Patient has been examined, vital signs and labs have been reviewed by Dr. Katragadda. ANC, Creatinine, LFTs, hemoglobin, and platelets are within treatment parameters per Dr. Katragadda. Patient is okay to proceed with treatment per M.D.   

## 2021-01-24 NOTE — Patient Instructions (Signed)
[facility]  Discharge Instructions: Thank you for choosing [facility] to provide your oncology and hematology care.  If you have a lab appointment with the Cancer Center, please come in thru the Main Entrance and check in at the main information desk.  Wear comfortable clothing and clothing appropriate for easy access to any Portacath or PICC line.   We strive to give you quality time with your provider. You may need to reschedule your appointment if you arrive late (15 or more minutes).  Arriving late affects you and other patients whose appointments are after yours.  Also, if you miss three or more appointments without notifying the office, you may be dismissed from the clinic at the provider's discretion.      For prescription refill requests, have your pharmacy contact our office and allow 72 hours for refills to be completed.        To help prevent nausea and vomiting after your treatment, we encourage you to take your nausea medication as directed.  BELOW ARE SYMPTOMS THAT SHOULD BE REPORTED IMMEDIATELY: *FEVER GREATER THAN 100.4 F (38 C) OR HIGHER *CHILLS OR SWEATING *NAUSEA AND VOMITING THAT IS NOT CONTROLLED WITH YOUR NAUSEA MEDICATION *UNUSUAL SHORTNESS OF BREATH *UNUSUAL BRUISING OR BLEEDING *URINARY PROBLEMS (pain or burning when urinating, or frequent urination) *BOWEL PROBLEMS (unusual diarrhea, constipation, pain near the anus) TENDERNESS IN MOUTH AND THROAT WITH OR WITHOUT PRESENCE OF ULCERS (sore throat, sores in mouth, or a toothache) UNUSUAL RASH, SWELLING OR PAIN  UNUSUAL VAGINAL DISCHARGE OR ITCHING   Items with * indicate a potential emergency and should be followed up as soon as possible or go to the Emergency Department if any problems should occur.  Please show the CHEMOTHERAPY ALERT CARD or IMMUNOTHERAPY ALERT CARD at check-in to the Emergency Department and triage nurse.  Should you have questions after your visit or need to cancel  or reschedule your appointment, please contact [facility] [phone]  and follow the prompts.  Office hours are 8:00 a.m. to 4:30 p.m. Monday - Friday. Please note that voicemails left after 4:00 p.m. may not be returned until the following business day.  We are closed weekends and major holidays. You have access to a nurse at all times for urgent questions. Please call the main number to the clinic [phone] and follow the prompts.  For any non-urgent questions, you may also contact your provider using MyChart. We now offer e-Visits for anyone 18 and older to request care online for non-urgent symptoms. For details visit [email or web address].   Also download the MyChart app! Go to the app store, search "MyChart", open the app, select [facility], and log in with your MyChart username and password.  Due to Covid, a mask is required upon entering the hospital/clinic. If you do not have a mask, one will be given to you upon arrival. For doctor visits, patients may have 1 support person aged 18 or older with them. For treatment visits, patients cannot have anyone with them due to current Covid guidelines and our immunocompromised population.  

## 2021-01-24 NOTE — Telephone Encounter (Addendum)
Patient stated he does not need the cholesterol medication because he has a 3 month supply at home and will discuss the medication with Dr Nicki Reaper at next appt.

## 2021-01-24 NOTE — Progress Notes (Signed)
Patients port flushed without difficulty.  Good blood return noted with no bruising or swelling noted at site.  Stable during access and blood draw.  Patient to remain accessed for treatment. 

## 2021-01-24 NOTE — Telephone Encounter (Signed)
Nurses With his recent lab work looking good with liver enzymes I am perfectly fine with the patient being back on statin as long as he is okay with it.  If so may have 6 months refill on each

## 2021-01-25 ENCOUNTER — Encounter (HOSPITAL_COMMUNITY): Payer: Self-pay | Admitting: Hematology

## 2021-01-25 LAB — CANCER ANTIGEN 19-9: CA 19-9: 396 U/mL — ABNORMAL HIGH (ref 0–35)

## 2021-01-26 ENCOUNTER — Other Ambulatory Visit: Payer: Self-pay

## 2021-01-26 ENCOUNTER — Inpatient Hospital Stay (HOSPITAL_COMMUNITY): Payer: BC Managed Care – PPO

## 2021-01-26 VITALS — BP 129/69 | HR 81 | Temp 98.6°F | Resp 18

## 2021-01-26 DIAGNOSIS — C259 Malignant neoplasm of pancreas, unspecified: Secondary | ICD-10-CM

## 2021-01-26 DIAGNOSIS — Z806 Family history of leukemia: Secondary | ICD-10-CM | POA: Diagnosis not present

## 2021-01-26 DIAGNOSIS — Z8249 Family history of ischemic heart disease and other diseases of the circulatory system: Secondary | ICD-10-CM | POA: Diagnosis not present

## 2021-01-26 DIAGNOSIS — Z79899 Other long term (current) drug therapy: Secondary | ICD-10-CM | POA: Diagnosis not present

## 2021-01-26 DIAGNOSIS — R5383 Other fatigue: Secondary | ICD-10-CM | POA: Diagnosis not present

## 2021-01-26 DIAGNOSIS — C250 Malignant neoplasm of head of pancreas: Secondary | ICD-10-CM | POA: Diagnosis not present

## 2021-01-26 DIAGNOSIS — Z95828 Presence of other vascular implants and grafts: Secondary | ICD-10-CM

## 2021-01-26 DIAGNOSIS — E119 Type 2 diabetes mellitus without complications: Secondary | ICD-10-CM | POA: Diagnosis not present

## 2021-01-26 DIAGNOSIS — Z5189 Encounter for other specified aftercare: Secondary | ICD-10-CM | POA: Diagnosis not present

## 2021-01-26 DIAGNOSIS — M5489 Other dorsalgia: Secondary | ICD-10-CM | POA: Diagnosis not present

## 2021-01-26 DIAGNOSIS — R634 Abnormal weight loss: Secondary | ICD-10-CM | POA: Diagnosis not present

## 2021-01-26 DIAGNOSIS — R197 Diarrhea, unspecified: Secondary | ICD-10-CM | POA: Diagnosis not present

## 2021-01-26 DIAGNOSIS — R109 Unspecified abdominal pain: Secondary | ICD-10-CM | POA: Diagnosis not present

## 2021-01-26 DIAGNOSIS — F17210 Nicotine dependence, cigarettes, uncomplicated: Secondary | ICD-10-CM | POA: Diagnosis not present

## 2021-01-26 DIAGNOSIS — K5900 Constipation, unspecified: Secondary | ICD-10-CM | POA: Diagnosis not present

## 2021-01-26 DIAGNOSIS — Z5111 Encounter for antineoplastic chemotherapy: Secondary | ICD-10-CM | POA: Diagnosis not present

## 2021-01-26 MED ORDER — ALTEPLASE 2 MG IJ SOLR: 2.0000 mg | Freq: Once | INTRAMUSCULAR | Status: DC | PRN

## 2021-01-26 MED ORDER — PEGFILGRASTIM-CBQV 6 MG/0.6ML ~~LOC~~ SOSY
6.0000 mg | PREFILLED_SYRINGE | Freq: Once | SUBCUTANEOUS | Status: AC
  Administered 2021-01-26: 6 mg via SUBCUTANEOUS
  Filled 2021-01-26: qty 0.6

## 2021-01-26 MED ORDER — HEPARIN SOD (PORK) LOCK FLUSH 100 UNIT/ML IV SOLN: 250.0000 [IU] | Freq: Once | INTRAVENOUS | Status: DC | PRN

## 2021-01-26 MED ORDER — HEPARIN SOD (PORK) LOCK FLUSH 100 UNIT/ML IV SOLN
500.0000 [IU] | Freq: Once | INTRAVENOUS | Status: AC | PRN
  Administered 2021-01-26: 500 [IU]

## 2021-01-26 MED ORDER — SODIUM CHLORIDE 0.9% FLUSH: 3.0000 mL | INTRAVENOUS | Status: DC | PRN

## 2021-01-26 MED ORDER — SODIUM CHLORIDE 0.9% FLUSH
10.0000 mL | INTRAVENOUS | Status: DC | PRN
  Administered 2021-01-26: 10 mL

## 2021-02-01 ENCOUNTER — Telehealth (HOSPITAL_COMMUNITY): Payer: Self-pay | Admitting: Dietician

## 2021-02-01 ENCOUNTER — Encounter (HOSPITAL_COMMUNITY): Payer: BC Managed Care – PPO | Admitting: Dietician

## 2021-02-01 NOTE — Telephone Encounter (Signed)
Nutrition Follow-up:  Patient receiving neoadjuvant FOLRIRNOX for pancreatic cancer.   Spoke with patient via telephone, he reports 2-3 days of cold sensitivity following treatment. Patient states his appetite is"terrible" but is making himself eat 3 "substantial" meals everyday. Patient reports some foods have a metallic taste, other foods he finds "just taste bad." Patient reports soups (tomato, chicken noodle, homemade beef/vegetable, potato) generally taste good to him and he likes chocolate milk and drinks this every morning. Patient had bacon, toast, cantaloupe, pineapple this morning, navy beans with tomatoes, hushpuppies, pickles for dinner, 1 1/2 [place] sandwiches, leftover cornbread dressing, potato salad for lunch yesterday. Sometimes he will snack on peanuts, almonds, pecans, raisins. His wife bought keto oral nutrition supplements, but he has not tried these yet. Patient denies nausea, vomiting, constipation, diarrhea. Patient reports his weights are stable at home, weighs himself daily.   Medications: Levemir, Humalog   Labs: 9/7 - Glucose 230  Anthropometrics: Last weight 192 lb 12.8 oz on 9/7 decreased from 195 lb on 8/30  8/19 - 197 lb 3.2 oz 8/1 - 192 lb   NUTRITION DIAGNOSIS: Unintentional weight loss ongoing   INTERVENTION:  Encouraged small frequent meals and snacks with adequate calories and protein - will mail snack ideas Discussed strategies for poor appetite and ways to add calories/protein to foods (preparing soups with whole milk vs water, adding cheese, cooking with butter, using supplement mixed with ice cream) Discussed tips for altered taste - will mail handout Ideas for warm supplements offered - will mail recipes Suggested adding bedtime snack Encouraged pt to try nutrition supplement, provided alternate supplement ideas and suggesting drinking Ensure Plus/equivalent daily (350 kcal, 16 grams protein) - will mail coupons Continue taking appetite stimulant as  prescribed  Suggested patient weigh weekly vs daily Contact information provided   MONITORING, EVALUATION, GOAL: weight trends, intake   NEXT VISIT: via telephone ~6 weeks

## 2021-02-06 NOTE — Progress Notes (Signed)
[place], [place] [address]   CLINIC:  Medical Oncology/Hematology  PCP:  [name], MD [address] [place] / [place] [address] [phone]   REASON FOR VISIT:  Follow-up for pancreatic adenocarcinoma  PRIOR THERAPY: none  NGS Results: not done  CURRENT THERAPY:  FOLFIRINOX every 2 weeks x 4 cycles  BRIEF ONCOLOGIC HISTORY:  Oncology History  Pancreatic adenocarcinoma ([name])  12/04/2020 Initial Diagnosis   Pancreatic adenocarcinoma ([name])   01/10/2021 -  Chemotherapy    Patient is on Treatment Plan: PANCREAS MODIFIED FOLFIRINOX Q14D X 4 CYCLES         CANCER STAGING: Cancer Staging Pancreatic adenocarcinoma ([name]) Staging form: Exocrine Pancreas, AJCC 8th Edition - Clinical stage from 12/04/2020: Stage IB (cT2, cN0, cM0) - Unsigned   INTERVAL HISTORY:  Alexander Banks, a 65 y.o. male, returns for routine follow-up and consideration for next cycle of chemotherapy. Alexander Banks was last seen on 01/24/2021.  Due for cycle #3 of FOLFIRINOX today.   Overall, he tells me he has been feeling pretty well. He denies mouth sores, and he reports his appetite is fair. He also reports changes in taste. He had cold sensitivity for 3 days following treatment, and tingling/numbness in his right big toes which limits ROM. He had diarrhea twice Sunday night and Monday morning, and it was since resolved. He denies fatigue, and he reports mild rhinorrhea with clear mucous. He continues to have left back pain which he reports feels hot.   Overall, he feels ready for next cycle of chemo today.   REVIEW OF SYSTEMS:  Review of Systems  Constitutional:  Positive for appetite change. Negative for fatigue (75%).  HENT:   Negative for mouth sores.        Rhinorrhea  Gastrointestinal:  Positive for diarrhea.  Musculoskeletal:  Positive for back pain and myalgias (spasm).  Neurological:  Positive for numbness (R big toe).  All other systems  reviewed and are negative.  PAST MEDICAL/SURGICAL HISTORY:  Past Medical History:  Diagnosis Date   Anemia, iron deficiency    Diabetes mellitus without complication ([place])    on meds   Hearing loss    Bil hearing aids   Hyperlipidemia    Hypertension    Port-A-Cath in place 12/26/2020   Prediabetes    Sleep apnea    Past Surgical History:  Procedure Laterality Date   BILIARY STENT PLACEMENT N/A 11/09/2020   Procedure: BILIARY STENT PLACEMENT - 80 F X 5 CM STENT;  Surgeon: Rogene Houston, MD;  Location: [facility];  Service: Endoscopy;  Laterality: N/A;   BILIARY STENT PLACEMENT N/A 12/27/2020   Procedure: BILIARY STENT PLACEMENT;  Surgeon: Rogene Houston, MD;  Location: [facility];  Service: Endoscopy;  Laterality: N/A;   COLONOSCOPY     ERCP N/A 11/09/2020   Procedure: ENDOSCOPIC RETROGRADE CHOLANGIOPANCREATOGRAPHY (ERCP);  Surgeon: Rogene Houston, MD;  Location: [facility];  Service: Endoscopy;  Laterality: N/A;   ERCP N/A 12/27/2020   Procedure: ENDOSCOPIC RETROGRADE CHOLANGIOPANCREATOGRAPHY (ERCP);  Surgeon: Rogene Houston, MD;  Location: [facility];  Service: Endoscopy;  Laterality: N/A;   ESOPHAGOGASTRODUODENOSCOPY (EGD) WITH PROPOFOL N/A 11/30/2020   Procedure: ESOPHAGOGASTRODUODENOSCOPY (EGD) WITH PROPOFOL;  Surgeon: Milus Banister, MD;  Location: [facility];  Service: Endoscopy;  Laterality: N/A;   EUS N/A 11/30/2020   Procedure: UPPER ENDOSCOPIC ULTRASOUND (EUS) RADIAL;  Surgeon: Milus Banister, MD;  Location: [facility];  Service: Endoscopy;  Laterality: N/A;   FINE NEEDLE ASPIRATION N/A 11/30/2020   Procedure: FINE NEEDLE ASPIRATION (FNA) LINEAR;  Surgeon: Milus Banister, MD;  Location: [facility];  Service: Endoscopy;  Laterality: N/A;   GIVENS CAPSULE STUDY     IR IMAGING GUIDED PORT INSERTION  12/18/2020   KNEE SURGERY     4 surgeries on left, 2 on right knee   SHOULDER SURGERY     right shoulder   SPHINCTEROTOMY N/A 11/09/2020   Procedure: SHORT BILIARY SPHINCTEROTOMY;   Surgeon: Rogene Houston, MD;  Location: [facility];  Service: Endoscopy;  Laterality: N/A;    SOCIAL HISTORY:  Social History   Socioeconomic History   Marital status: Married    Spouse name: Not on file   Number of children: Not on file   Years of education: Not on file   Highest education level: Not on file  Occupational History   Not on file  Tobacco Use   Smoking status: Every Day    Types: Cigars   Smokeless tobacco: Never   Tobacco comments:    Smoke 3-4 cigars a day  Vaping Use   Vaping Use: Never used  Substance and Sexual Activity   Alcohol use: Yes    Comment: occasional   Drug use: Never   Sexual activity: Not on file  Other Topics Concern   Not on file  Social History Narrative   Not on file   Social Determinants of Health   Financial Resource Strain: Low Risk    Difficulty of Paying Living Expenses: Not hard at all  Food Insecurity: No Food Insecurity   Worried About Charity fundraiser in the Last Year: Never true   [place] in the Last Year: Never true  Transportation Needs: No Transportation Needs   Lack of Transportation (Medical): No   Lack of Transportation (Non-Medical): No  Physical Activity: Sufficiently Active   Days of Exercise per Week: 5 days   Minutes of Exercise per Session: 30 min  Stress: Not on file  Social Connections: Not on file  Intimate Partner Violence: Not At Risk   Fear of Current or Ex-Partner: No   Emotionally Abused: No   Physically Abused: No   Sexually Abused: No    FAMILY HISTORY:  Family History  Problem Relation Age of Onset   Heart attack Father    Heart attack Paternal Grandfather    Leukemia Sister    Heart attack Brother     CURRENT MEDICATIONS:  Current Outpatient Medications  Medication Sig Dispense Refill   Continuous Blood Gluc Receiver (FREESTYLE LIBRE 2 READER) DEVI As directed 1 each 0   Continuous Blood Gluc Sensor (FREESTYLE LIBRE 2 SENSOR) MISC 1 Piece by Does not apply route every 14  (fourteen) days. 6 each 2   dronabinol (MARINOL) 5 MG capsule Take 1 capsule (5 mg total) by mouth 2 (two) times daily before a meal. 60 capsule 2   fluorouracil CALGB 73710 2,400 mg/m2 in sodium chloride 0.9 % 150 mL Inject 2,400 mg/m2 into the vein over 48 hr. Every 14 days     Glucosamine-Chondroitin (OSTEO BI-FLEX REGULAR STRENGTH PO) Take 2 tablets by mouth daily.     insulin detemir (LEVEMIR) 100 UNIT/ML FlexPen Inject 35 Units into the skin at bedtime. 45 mL 0   insulin lispro (HUMALOG KWIKPEN) 100 UNIT/ML KwikPen Inject 10-16 Units into the skin 3 (three) times daily before meals. 45 mL 1   IRINOTECAN HCL IV Inject 150 mg/m2  into the vein every 14 (fourteen) days.     IRON-VITAMIN C PO Take 1 tablet by mouth daily.     LEUCOVORIN CALCIUM IV Inject 400 mg/m2 into the vein every 14 (fourteen) days.     levothyroxine (SYNTHROID) 175 MCG tablet TAKE 1 TABLET DAILY (NEEDS VISIT IN [place]) 90 tablet 1   lidocaine-prilocaine (EMLA) cream Apply a small amount to port a cath site (do not rub in) and cover with plastic wrap 1 hour prior to infusion appointments 30 g 3   Melatonin-Pyridoxine (MELATIN PO) Take by mouth. Takes 2 qhs     ondansetron (ZOFRAN) 8 MG tablet Take 1 tablet (8 mg total) by mouth 2 (two) times daily as needed. Start on day 3 after chemotherapy. 30 tablet 1   OXALIPLATIN IV Inject 85 mg/m2 into the vein every 14 (fourteen) days.     oxyCODONE-acetaminophen (PERCOCET/ROXICET) 5-325 MG tablet Take 1 tablet by mouth every 6 (six) hours as needed for severe pain.     prochlorperazine (COMPAZINE) 10 MG tablet Take 10 mg by mouth every 6 (six) hours as needed for nausea or vomiting.     No current facility-administered medications for this visit.   Facility-Administered Medications Ordered in Other Visits  Medication Dose Route Frequency Provider Last Rate Last Admin   alteplase (CATHFLO ACTIVASE) injection 2 mg  2 mg Intracatheter Once PRN Derek Jack, MD       heparin  lock flush 100 unit/mL  250 Units Intracatheter Once PRN Derek Jack, MD       sodium chloride flush (NS) 0.9 % injection 10 mL  10 mL Intracatheter PRN Derek Jack, MD   10 mL at 01/26/21 1314   sodium chloride flush (NS) 0.9 % injection 3 mL  3 mL Intracatheter PRN Derek Jack, MD        ALLERGIES:  Allergies  Allergen Reactions   Lisinopril Cough    PHYSICAL EXAM:  Performance status (ECOG): 1 - Symptomatic but completely ambulatory  There were no vitals filed for this visit. Wt Readings from Last 3 Encounters:  01/24/21 192 lb 12.8 oz (87.5 kg)  01/16/21 195 lb (88.5 kg)  01/10/21 195 lb 1.6 oz (88.5 kg)   Physical Exam Vitals reviewed.  Constitutional:      Appearance: Normal appearance.  Cardiovascular:     Rate and Rhythm: Normal rate and regular rhythm.     Pulses: Normal pulses.     Heart sounds: Normal heart sounds.  Pulmonary:     Effort: Pulmonary effort is normal.     Breath sounds: Normal breath sounds.  Neurological:     General: No focal deficit present.     Mental Status: He is alert and oriented to person, place, and time.  Psychiatric:        Mood and Affect: Mood normal.        Behavior: Behavior normal.    LABORATORY DATA:  I have reviewed the labs as listed.  CBC Latest Ref Rng & Units 01/24/2021 01/10/2021 12/29/2020  WBC 4.0 - 10.5 K/uL 13.2(H) 7.2 11.6(H)  Hemoglobin 13.0 - 17.0 g/dL 12.3(L) 12.4(L) 10.9(L)  Hematocrit 39.0 - 52.0 % 38.7(L) 39.2 33.6(L)  Platelets 150 - 400 K/uL 217 449(H) 160   CMP Latest Ref Rng & Units 01/24/2021 01/10/2021 12/30/2020  Glucose 70 - 99 mg/dL 230(H) 219(H) 216(H)  BUN 8 - 23 mg/dL 14 15 15   Creatinine 0.61 - 1.24 mg/dL 0.87 0.84 0.88  Sodium 135 - 145 mmol/L 135 135  136  Potassium 3.5 - 5.1 mmol/L 3.8 4.2 4.5  Chloride 98 - 111 mmol/L 107 105 102  CO2 22 - 32 mmol/L 22 24 29   Calcium 8.9 - 10.3 mg/dL 7.8(L) 8.4(L) 8.4(L)  Total Protein 6.5 - 8.1 g/dL 5.9(L) 6.5 5.6(L)  Total  Bilirubin 0.3 - 1.2 mg/dL 0.3 0.8 1.2  Alkaline Phos 38 - 126 U/L 142(H) 120 236(H)  AST 15 - 41 U/L 14(L) 15 21  ALT 0 - 44 U/L 13 16 54(H)    DIAGNOSTIC IMAGING:  I have independently reviewed the scans and discussed with the patient. DG Abd 1 View  Result Date: 01/09/2021 CLINICAL DATA:  Biliary stent placement EXAM: ABDOMEN - 1 VIEW COMPARISON:  19/16/6060 FINDINGS: Metallic internal biliary stent overlies its expected position within the right paraspinal region and demonstrates interval expansion since prior examination. Previously noted silastic internal biliary stent likely within the enteric tract is no longer visualized and high Ki has passed in the interval. Normal abdominal gas pattern. Degenerative changes are seen within the lumbar spine. Vascular calcifications noted within the pelvis. IMPRESSION: Metallic biliary stent in expected position with interval expansion to near nominal diameter since prior examination. Electronically Signed   By: Fidela Salisbury M.D.   On: 01/09/2021 21:05     ASSESSMENT:  1.  Stage I (T2N0) pancreatic adenocarcinoma: - Recent admission to the hospital with DKA, found to have elevated liver enzymes. - MRCP without contrast on 11/08/2020 showed 2.3 x 2 cm central pancreatic head and uncinate mass with intra and extrahepatic biliary ductal dilation. - 11/09/2020-ERCP sphincterotomy and stenting with findings showing short high-grade distal CBD stricture with dilated CBD up to 14 mm.  Bile duct brushing was negative for malignant cells. - 11/10/2020-CA 19.9-367. - ERCP/EUS by [name] on 11/30/2020 with 2.9 cm mass in the head of the pancreas with unclear outer borders.  Given significant dilatation, indistinct borders, unable to confidently assess major vascular involvement.  Abutment and probable invasion of SMV and portal vein near the confluence with splenic vein.  No obvious SMA or celiac artery involvement.  No other adenopathy. - FNA of the pancreatic  mass consistent with adenocarcinoma. - Weight loss intentional 25 pounds between January and April 1.  Additional 25 pound weight loss since 1 April, unintentional. - CT [facility] pancreatic protocol on 12/05/2020 with ill-defined mass in the pancreatic head and uncinate process measuring up to 1.9 x 2.1 cm with associated pancreatic ductal dilatation up to 8 mm.  No enlarged adenopathy.  Teardrop morphology of SMV indicative of SMV involvement.  Replaced right hepatic arterial supply arises from the SMA passing immediately posterior to the tumor. - PET scan on 12/14/2020 with mass in the pancreatic head with hypermetabolic features, SUV 5.3 with no sign of metastatic disease. - Neoadjuvant FOLFIRINOX started on 01/10/2021.  2.  Social/family history: - He lives at home with his wife and is independent of all ADLs and IADLs. - He currently works in [employer].  No history of chemical exposure. - He previously smoked cigarettes.  Currently smokes cigars 4/day. - Maternal grandmother died at age 71 with malignancy, unknown type. - Sister died of acute leukemia.   PLAN:  1.  Stage I (T2N0) pancreatic adenocarcinoma: - He completed cycle 2 of FOLFIRINOX. - He had diarrhea for 1 day, about 2 times.  He reported some gas.  Also reported decrease in taste. - He had cold sensitivity in the throat which lasted about 2 to 3 days.  Denies  any tingling or numbness in the hands.  Right big toe sometimes feels asleep. - Reviewed labs today which showed elevated alk phos of 146.  We will continue to hold rosuvastatin.  Rest of LFTs are normal.  CBC was grossly normal.  CA 19-9 was elevated at 396. - Proceed with cycle 3 today.  RTC 2 weeks for follow-up. - He would like to have surgery done prior to the end of the year as he has met all the deductibles.  We will plan to continue chemotherapy for 6 cycles followed by scan and possible surgery.  2.  Weight loss: - He did not lose any weight.  He gained about 2 pounds since  last visit. - However he reports Marinol is not helping with appetite. - He will continue Marinol until he finishes the bottle and stop.  3.  Diabetes: - Continue Humalog 10-12 units 3 times daily with meals. - Continue Levemir 35 units at bedtime.  4.  Paraspinal back pain: - Continue Percocet as needed.  Likely stent related.   Orders placed this encounter:  No orders of the defined types were placed in this encounter.    Derek Jack, MD [place] [phone]   I, Thana Ates, am acting as a scribe for Dr. Derek Jack.  I, Derek Jack MD, have reviewed the above documentation for accuracy and completeness, and I agree with the above.

## 2021-02-07 ENCOUNTER — Inpatient Hospital Stay (HOSPITAL_BASED_OUTPATIENT_CLINIC_OR_DEPARTMENT_OTHER): Payer: BC Managed Care – PPO | Admitting: Hematology

## 2021-02-07 ENCOUNTER — Inpatient Hospital Stay (HOSPITAL_COMMUNITY): Payer: BC Managed Care – PPO

## 2021-02-07 ENCOUNTER — Other Ambulatory Visit: Payer: Self-pay

## 2021-02-07 ENCOUNTER — Encounter (HOSPITAL_COMMUNITY): Payer: Self-pay | Admitting: Hematology

## 2021-02-07 VITALS — BP 128/60 | HR 88 | Temp 98.6°F | Resp 17

## 2021-02-07 DIAGNOSIS — R634 Abnormal weight loss: Secondary | ICD-10-CM | POA: Diagnosis not present

## 2021-02-07 DIAGNOSIS — C259 Malignant neoplasm of pancreas, unspecified: Secondary | ICD-10-CM

## 2021-02-07 DIAGNOSIS — Z806 Family history of leukemia: Secondary | ICD-10-CM | POA: Diagnosis not present

## 2021-02-07 DIAGNOSIS — E119 Type 2 diabetes mellitus without complications: Secondary | ICD-10-CM | POA: Diagnosis not present

## 2021-02-07 DIAGNOSIS — R5383 Other fatigue: Secondary | ICD-10-CM | POA: Diagnosis not present

## 2021-02-07 DIAGNOSIS — Z5111 Encounter for antineoplastic chemotherapy: Secondary | ICD-10-CM | POA: Diagnosis not present

## 2021-02-07 DIAGNOSIS — C250 Malignant neoplasm of head of pancreas: Secondary | ICD-10-CM | POA: Diagnosis not present

## 2021-02-07 DIAGNOSIS — Z5189 Encounter for other specified aftercare: Secondary | ICD-10-CM | POA: Diagnosis not present

## 2021-02-07 DIAGNOSIS — F17210 Nicotine dependence, cigarettes, uncomplicated: Secondary | ICD-10-CM | POA: Diagnosis not present

## 2021-02-07 DIAGNOSIS — K5900 Constipation, unspecified: Secondary | ICD-10-CM | POA: Diagnosis not present

## 2021-02-07 DIAGNOSIS — M5489 Other dorsalgia: Secondary | ICD-10-CM | POA: Diagnosis not present

## 2021-02-07 DIAGNOSIS — Z79899 Other long term (current) drug therapy: Secondary | ICD-10-CM | POA: Diagnosis not present

## 2021-02-07 DIAGNOSIS — Z8249 Family history of ischemic heart disease and other diseases of the circulatory system: Secondary | ICD-10-CM | POA: Diagnosis not present

## 2021-02-07 DIAGNOSIS — Z95828 Presence of other vascular implants and grafts: Secondary | ICD-10-CM

## 2021-02-07 DIAGNOSIS — R109 Unspecified abdominal pain: Secondary | ICD-10-CM | POA: Diagnosis not present

## 2021-02-07 DIAGNOSIS — R197 Diarrhea, unspecified: Secondary | ICD-10-CM | POA: Diagnosis not present

## 2021-02-07 LAB — CBC WITH DIFFERENTIAL/PLATELET
Abs Immature Granulocytes: 0.7 10*3/uL — ABNORMAL HIGH (ref 0.00–0.07)
Basophils Absolute: 0.1 10*3/uL (ref 0.0–0.1)
Basophils Relative: 1 %
Eosinophils Absolute: 0.1 10*3/uL (ref 0.0–0.5)
Eosinophils Relative: 1 %
HCT: 37.1 % — ABNORMAL LOW (ref 39.0–52.0)
Hemoglobin: 12 g/dL — ABNORMAL LOW (ref 13.0–17.0)
Immature Granulocytes: 5 %
Lymphocytes Relative: 11 %
Lymphs Abs: 1.5 10*3/uL (ref 0.7–4.0)
MCH: 30.6 pg (ref 26.0–34.0)
MCHC: 32.3 g/dL (ref 30.0–36.0)
MCV: 94.6 fL (ref 80.0–100.0)
Monocytes Absolute: 1.1 10*3/uL — ABNORMAL HIGH (ref 0.1–1.0)
Monocytes Relative: 7 %
Neutro Abs: 10.8 10*3/uL — ABNORMAL HIGH (ref 1.7–7.7)
Neutrophils Relative %: 75 %
Platelets: 334 10*3/uL (ref 150–400)
RBC: 3.92 MIL/uL — ABNORMAL LOW (ref 4.22–5.81)
RDW: 15.6 % — ABNORMAL HIGH (ref 11.5–15.5)
WBC: 14.3 10*3/uL — ABNORMAL HIGH (ref 4.0–10.5)
nRBC: 0 % (ref 0.0–0.2)

## 2021-02-07 LAB — COMPREHENSIVE METABOLIC PANEL
ALT: 13 U/L (ref 0–44)
AST: 14 U/L — ABNORMAL LOW (ref 15–41)
Albumin: 2.9 g/dL — ABNORMAL LOW (ref 3.5–5.0)
Alkaline Phosphatase: 146 U/L — ABNORMAL HIGH (ref 38–126)
Anion gap: 6 (ref 5–15)
BUN: 15 mg/dL (ref 8–23)
CO2: 24 mmol/L (ref 22–32)
Calcium: 8.1 mg/dL — ABNORMAL LOW (ref 8.9–10.3)
Chloride: 104 mmol/L (ref 98–111)
Creatinine, Ser: 0.92 mg/dL (ref 0.61–1.24)
GFR, Estimated: >60 mL/min (ref >60)
Glucose, Bld: 203 mg/dL — ABNORMAL HIGH (ref 70–99)
Potassium: 4.2 mmol/L (ref 3.5–5.1)
Sodium: 134 mmol/L — ABNORMAL LOW (ref 135–145)
Total Bilirubin: 0.3 mg/dL (ref 0.3–1.2)
Total Protein: 6.4 g/dL — ABNORMAL LOW (ref 6.5–8.1)

## 2021-02-07 LAB — MAGNESIUM: Magnesium: 1.8 mg/dL (ref 1.7–2.4)

## 2021-02-07 MED ORDER — SODIUM CHLORIDE 0.9 % IV SOLN
150.0000 mg/m2 | Freq: Once | INTRAVENOUS | Status: AC
  Administered 2021-02-07: 320 mg via INTRAVENOUS
  Filled 2021-02-07: qty 2

## 2021-02-07 MED ORDER — OXALIPLATIN CHEMO INJECTION 100 MG/20ML
85.0000 mg/m2 | Freq: Once | INTRAVENOUS | Status: AC
  Administered 2021-02-07: 180 mg via INTRAVENOUS
  Filled 2021-02-07: qty 36

## 2021-02-07 MED ORDER — SODIUM CHLORIDE 0.9 % IV SOLN
10.0000 mg | Freq: Once | INTRAVENOUS | Status: AC
  Administered 2021-02-07: 10 mg via INTRAVENOUS
  Filled 2021-02-07: qty 10

## 2021-02-07 MED ORDER — SODIUM CHLORIDE 0.9 % IV SOLN: Freq: Once | INTRAVENOUS | Status: DC

## 2021-02-07 MED ORDER — PALONOSETRON HCL INJECTION 0.25 MG/5ML
0.2500 mg | Freq: Once | INTRAVENOUS | Status: AC
  Administered 2021-02-07: 0.25 mg via INTRAVENOUS
  Filled 2021-02-07: qty 5

## 2021-02-07 MED ORDER — SODIUM CHLORIDE 0.9 % IV SOLN
400.0000 mg/m2 | Freq: Once | INTRAVENOUS | Status: AC
  Administered 2021-02-07: 852 mg via INTRAVENOUS
  Filled 2021-02-07: qty 42.6

## 2021-02-07 MED ORDER — ATROPINE SULFATE 1 MG/ML IJ SOLN
0.5000 mg | Freq: Once | INTRAMUSCULAR | Status: AC | PRN
  Administered 2021-02-07: 0.5 mg via INTRAVENOUS
  Filled 2021-02-07: qty 1

## 2021-02-07 MED ORDER — HEPARIN SOD (PORK) LOCK FLUSH 100 UNIT/ML IV SOLN: 500.0000 [IU] | Freq: Once | INTRAVENOUS | Status: DC | PRN

## 2021-02-07 MED ORDER — DEXTROSE 5 % IV SOLN: Freq: Once | INTRAVENOUS | Status: AC

## 2021-02-07 MED ORDER — SODIUM CHLORIDE 0.9% FLUSH: 10.0000 mL | INTRAVENOUS | Status: DC | PRN

## 2021-02-07 MED ORDER — SODIUM CHLORIDE 0.9 % IV SOLN
150.0000 mg | Freq: Once | INTRAVENOUS | Status: AC
  Administered 2021-02-07: 150 mg via INTRAVENOUS
  Filled 2021-02-07: qty 150

## 2021-02-07 MED ORDER — SODIUM CHLORIDE 0.9 % IV SOLN
2350.0000 mg/m2 | INTRAVENOUS | Status: DC
  Administered 2021-02-07: 5000 mg via INTRAVENOUS
  Filled 2021-02-07: qty 100

## 2021-02-07 NOTE — Patient Instructions (Addendum)
[facility] at [facility] Discharge Instructions  You were seen today by Dr. Katragadda. He went over your recent results, and you received your treatment. Dr. Katragadda will see you back in 2 weeks for labs and follow up.   Thank you for choosing  Can[facility] Center at [facility] to provide your oncology and hematology care.  To afford each patient quality time with our provider, please arrive at least 15 minutes before your scheduled appointment time.   If you have a lab appointment with the Cancer Center please come in thru the Main Entrance and check in at the main information desk  You need to re-schedule your appointment should you arrive 10 or more minutes late.  We strive to give you quality time with our providers, and arriving late affects you and other patients whose appointments are after yours.  Also, if you no show three or more times for appointments you may be dismissed from the clinic at the providers discretion.     Again, thank you for choosing [facility].  Our hope is that these requests will decrease the amount of time that you wait before being seen by our physicians.       _____________________________________________________________  Should you have questions after your visit to [facility], please contact our office at [phone] between the hours of 8:00 a.m. and 4:30 p.m.  Voicemails left after 4:00 p.m. will not be returned until the following business day.  For prescription refill requests, have your pharmacy contact our office and allow 72 hours.    Cancer Center Support Programs:   > Cancer Support Group  2nd Tuesday of the month 1pm-2pm, Journey Room   

## 2021-02-07 NOTE — Progress Notes (Signed)
Patient presents today for FOLFIRINOX, okay for treatment per [name]. Patient tolerated chemotherapy with no complaints voiced. Side effects with management reviewed understanding verbalized. Port site clean and dry with no bruising or swelling noted at site. Good blood return noted before and after administration of chemotherapy.Chemo pump connected with no alarms noted. Patient left in satisfactory condition with VSS and no s/s of distress noted.

## 2021-02-07 NOTE — Progress Notes (Signed)
Port flushed with good blood return noted. No bruising or swelling at site. Awaiting labs for treatment.

## 2021-02-07 NOTE — Patient Instructions (Signed)
[facility]  Discharge Instructions: Thank you for choosing [facility] to provide your oncology and hematology care.  If you have a lab appointment with the [facility], please come in thru the Main Entrance and check in at the main information desk.  Wear comfortable clothing and clothing appropriate for easy access to any Portacath or PICC line.   We strive to give you quality time with your provider. You may need to reschedule your appointment if you arrive late (15 or more minutes).  Arriving late affects you and other patients whose appointments are after yours.  Also, if you miss three or more appointments without notifying the office, you may be dismissed from the clinic at the provider's discretion.      For prescription refill requests, have your pharmacy contact our office and allow 72 hours for refills to be completed.    Today you received the following chemotherapy and/or immunotherapy agents Folfirinox and chemo pump was connected. Return as scheduled.   To help prevent nausea and vomiting after your treatment, we encourage you to take your nausea medication as directed.  BELOW ARE SYMPTOMS THAT SHOULD BE REPORTED IMMEDIATELY: *FEVER GREATER THAN 100.4 F (38 C) OR HIGHER *CHILLS OR SWEATING *NAUSEA AND VOMITING THAT IS NOT CONTROLLED WITH YOUR NAUSEA MEDICATION *UNUSUAL SHORTNESS OF BREATH *UNUSUAL BRUISING OR BLEEDING *URINARY PROBLEMS (pain or burning when urinating, or frequent urination) *BOWEL PROBLEMS (unusual diarrhea, constipation, pain near the anus) TENDERNESS IN MOUTH AND THROAT WITH OR WITHOUT PRESENCE OF ULCERS (sore throat, sores in mouth, or a toothache) UNUSUAL RASH, SWELLING OR PAIN  UNUSUAL VAGINAL DISCHARGE OR ITCHING   Items with * indicate a potential emergency and should be followed up as soon as possible or go to the Emergency Department if any problems should occur.  Please show the CHEMOTHERAPY ALERT CARD or  IMMUNOTHERAPY ALERT CARD at check-in to the Emergency Department and triage nurse.  Should you have questions after your visit or need to cancel or reschedule your appointment, please contact [facility] [phone]  and follow the prompts.  Office hours are 8:00 a.m. to 4:30 p.m. Monday - Friday. Please note that voicemails left after 4:00 p.m. may not be returned until the following business day.  We are closed weekends and major holidays. You have access to a nurse at all times for urgent questions. Please call the main number to the clinic [phone] and follow the prompts.  For any non-urgent questions, you may also contact your provider using MyChart. We now offer e-Visits for anyone 16 and older to request care online for non-urgent symptoms. For details visit [email or web address].   Also download the MyChart app! Go to the app store, search "MyChart", open the app, select [facility], and log in with your MyChart username and password.  Due to Covid, a mask is required upon entering the hospital/clinic. If you do not have a mask, one will be given to you upon arrival. For doctor visits, patients may have 1 support person aged 11 or older with them. For treatment visits, patients cannot have anyone with them due to current Covid guidelines and our immunocompromised population.

## 2021-02-07 NOTE — Progress Notes (Signed)
Patient has been examined, vital signs and labs have been reviewed by Dr. Katragadda. ANC, Creatinine, LFTs, hemoglobin, and platelets are within treatment parameters per Dr. Katragadda. Patient is okay to proceed with treatment per M.D.   

## 2021-02-09 ENCOUNTER — Inpatient Hospital Stay (HOSPITAL_COMMUNITY): Payer: BC Managed Care – PPO

## 2021-02-09 ENCOUNTER — Other Ambulatory Visit: Payer: Self-pay

## 2021-02-09 VITALS — BP 132/72 | HR 75 | Temp 98.7°F | Resp 16

## 2021-02-09 DIAGNOSIS — K5900 Constipation, unspecified: Secondary | ICD-10-CM | POA: Diagnosis not present

## 2021-02-09 DIAGNOSIS — M5489 Other dorsalgia: Secondary | ICD-10-CM | POA: Diagnosis not present

## 2021-02-09 DIAGNOSIS — Z5111 Encounter for antineoplastic chemotherapy: Secondary | ICD-10-CM | POA: Diagnosis not present

## 2021-02-09 DIAGNOSIS — R197 Diarrhea, unspecified: Secondary | ICD-10-CM | POA: Diagnosis not present

## 2021-02-09 DIAGNOSIS — Z5189 Encounter for other specified aftercare: Secondary | ICD-10-CM | POA: Diagnosis not present

## 2021-02-09 DIAGNOSIS — Z95828 Presence of other vascular implants and grafts: Secondary | ICD-10-CM

## 2021-02-09 DIAGNOSIS — E119 Type 2 diabetes mellitus without complications: Secondary | ICD-10-CM | POA: Diagnosis not present

## 2021-02-09 DIAGNOSIS — Z806 Family history of leukemia: Secondary | ICD-10-CM | POA: Diagnosis not present

## 2021-02-09 DIAGNOSIS — Z8249 Family history of ischemic heart disease and other diseases of the circulatory system: Secondary | ICD-10-CM | POA: Diagnosis not present

## 2021-02-09 DIAGNOSIS — R5383 Other fatigue: Secondary | ICD-10-CM | POA: Diagnosis not present

## 2021-02-09 DIAGNOSIS — R634 Abnormal weight loss: Secondary | ICD-10-CM | POA: Diagnosis not present

## 2021-02-09 DIAGNOSIS — C250 Malignant neoplasm of head of pancreas: Secondary | ICD-10-CM | POA: Diagnosis not present

## 2021-02-09 DIAGNOSIS — C259 Malignant neoplasm of pancreas, unspecified: Secondary | ICD-10-CM

## 2021-02-09 DIAGNOSIS — Z79899 Other long term (current) drug therapy: Secondary | ICD-10-CM | POA: Diagnosis not present

## 2021-02-09 DIAGNOSIS — F17210 Nicotine dependence, cigarettes, uncomplicated: Secondary | ICD-10-CM | POA: Diagnosis not present

## 2021-02-09 DIAGNOSIS — R109 Unspecified abdominal pain: Secondary | ICD-10-CM | POA: Diagnosis not present

## 2021-02-09 MED ORDER — SODIUM CHLORIDE 0.9% FLUSH
10.0000 mL | INTRAVENOUS | Status: DC | PRN
  Administered 2021-02-09: 10 mL

## 2021-02-09 MED ORDER — HEPARIN SOD (PORK) LOCK FLUSH 100 UNIT/ML IV SOLN
500.0000 [IU] | Freq: Once | INTRAVENOUS | Status: AC | PRN
  Administered 2021-02-09: 500 [IU]

## 2021-02-09 MED ORDER — PEGFILGRASTIM-CBQV 6 MG/0.6ML ~~LOC~~ SOSY
6.0000 mg | PREFILLED_SYRINGE | Freq: Once | SUBCUTANEOUS | Status: AC
  Administered 2021-02-09: 6 mg via SUBCUTANEOUS
  Filled 2021-02-09: qty 0.6

## 2021-02-09 NOTE — Patient Instructions (Signed)
[facility]  Discharge Instructions: Thank you for choosing [facility] to provide your oncology and hematology care.  If you have a lab appointment with the Cancer Center, please come in thru the Main Entrance and check in at the main information desk.  Wear comfortable clothing and clothing appropriate for easy access to any Portacath or PICC line.   We strive to give you quality time with your provider. You may need to reschedule your appointment if you arrive late (15 or more minutes).  Arriving late affects you and other patients whose appointments are after yours.  Also, if you miss three or more appointments without notifying the office, you may be dismissed from the clinic at the provider's discretion.      For prescription refill requests, have your pharmacy contact our office and allow 72 hours for refills to be completed.        To help prevent nausea and vomiting after your treatment, we encourage you to take your nausea medication as directed.  BELOW ARE SYMPTOMS THAT SHOULD BE REPORTED IMMEDIATELY: *FEVER GREATER THAN 100.4 F (38 C) OR HIGHER *CHILLS OR SWEATING *NAUSEA AND VOMITING THAT IS NOT CONTROLLED WITH YOUR NAUSEA MEDICATION *UNUSUAL SHORTNESS OF BREATH *UNUSUAL BRUISING OR BLEEDING *URINARY PROBLEMS (pain or burning when urinating, or frequent urination) *BOWEL PROBLEMS (unusual diarrhea, constipation, pain near the anus) TENDERNESS IN MOUTH AND THROAT WITH OR WITHOUT PRESENCE OF ULCERS (sore throat, sores in mouth, or a toothache) UNUSUAL RASH, SWELLING OR PAIN  UNUSUAL VAGINAL DISCHARGE OR ITCHING   Items with * indicate a potential emergency and should be followed up as soon as possible or go to the Emergency Department if any problems should occur.  Please show the CHEMOTHERAPY ALERT CARD or IMMUNOTHERAPY ALERT CARD at check-in to the Emergency Department and triage nurse.  Should you have questions after your visit or need to cancel  or reschedule your appointment, please contact [facility] [phone]  and follow the prompts.  Office hours are 8:00 a.m. to 4:30 p.m. Monday - Friday. Please note that voicemails left after 4:00 p.m. may not be returned until the following business day.  We are closed weekends and major holidays. You have access to a nurse at all times for urgent questions. Please call the main number to the clinic [phone] and follow the prompts.  For any non-urgent questions, you may also contact your provider using MyChart. We now offer e-Visits for anyone 18 and older to request care online for non-urgent symptoms. For details visit [email or web address].   Also download the MyChart app! Go to the app store, search "MyChart", open the app, select [facility], and log in with your MyChart username and password.  Due to Covid, a mask is required upon entering the hospital/clinic. If you do not have a mask, one will be given to you upon arrival. For doctor visits, patients may have 1 support person aged 18 or older with them. For treatment visits, patients cannot have anyone with them due to current Covid guidelines and our immunocompromised population.  

## 2021-02-09 NOTE — Progress Notes (Signed)
Alexander Banks presents to have home infusion pump d/c'd and for port-a-cath deaccess with flush.  Portacath located right chest wall accessed with  H 20 needle.  Good blood return present. Portacath flushed with NS and 500U/70ml Heparin, and needle removed intact.  Procedure tolerated well and without incident.   Udenyca injection given per orders. Patient tolerated it well without problems. Vitals stable and discharged home from clinic ambulatory. Follow up as scheduled.

## 2021-02-10 DIAGNOSIS — C259 Malignant neoplasm of pancreas, unspecified: Secondary | ICD-10-CM | POA: Diagnosis not present

## 2021-02-20 NOTE — Progress Notes (Signed)
[place], [place] [address]   CLINIC:  Medical Oncology/Hematology  PCP:  [name], MD [address] [place] / [place] [address] [phone]   REASON FOR VISIT:  Follow-up for pancreatic adenocarcinoma  PRIOR THERAPY: none  NGS Results: not done  CURRENT THERAPY: FOLFIRINOX every 2 weeks x 4 cycles  BRIEF ONCOLOGIC HISTORY:  Oncology History  Pancreatic adenocarcinoma ([place])  12/04/2020 Initial Diagnosis   Pancreatic adenocarcinoma ([place])   01/10/2021 -  Chemotherapy    Patient is on Treatment Plan: PANCREAS MODIFIED FOLFIRINOX Q14D X 4 CYCLES         CANCER STAGING: Cancer Staging Pancreatic adenocarcinoma ([place]) Staging form: Exocrine Pancreas, AJCC 8th Edition - Clinical stage from 12/04/2020: Stage IB (cT2, cN0, cM0) - Unsigned   INTERVAL HISTORY:  [name], a 65 y.o. male, returns for routine follow-up and consideration for next cycle of chemotherapy. Danish was last seen on 02/07/2021.  Due for cycle #4 of FOLFIRINOX today.   Overall, he tells me he has been feeling poorly. He reports decreased appetite, and he is inconsistently taking Marinol. He is currently drinking 2 Ensures accompanied by 3 meals daily. He reports 2 episodes of vomiting on 10/1. He also reports watery diarrhea starting on 10/1 which has not resolved and occurs 6-7 times daily. He is currently taking imodium BID, and is hesitant to take more in case it causes constipation. He reports cold sensitivity and occasional intermittent tingling in his hands which lasts for about 5 minutes. He denies mouth sores. He reports bloating in his abdomin which causes pressure and discomfort.   Overall, he is not ready for next cycle of chemo today.   REVIEW OF SYSTEMS:  Review of Systems  Constitutional:  Positive for appetite change (25%) and fatigue (75%).  HENT:   Negative for mouth sores.   Gastrointestinal:  Positive for abdominal pain  (4/10), diarrhea, nausea and vomiting.  Neurological:  Positive for dizziness and numbness.  All other systems reviewed and are negative.  PAST MEDICAL/SURGICAL HISTORY:  Past Medical History:  Diagnosis Date   Anemia, iron deficiency    Diabetes mellitus without complication ([place])    on meds   Hearing loss    Bil hearing aids   Hyperlipidemia    Hypertension    Port-A-Cath in place 12/26/2020   Prediabetes    Sleep apnea    Past Surgical History:  Procedure Laterality Date   BILIARY STENT PLACEMENT N/A 11/09/2020   Procedure: BILIARY STENT PLACEMENT - 24 F X 5 CM STENT;  Surgeon: Rogene Houston, MD;  Location: [facility];  Service: Endoscopy;  Laterality: N/A;   BILIARY STENT PLACEMENT N/A 12/27/2020   Procedure: BILIARY STENT PLACEMENT;  Surgeon: Rogene Houston, MD;  Location: [facility];  Service: Endoscopy;  Laterality: N/A;   COLONOSCOPY     ERCP N/A 11/09/2020   Procedure: ENDOSCOPIC RETROGRADE CHOLANGIOPANCREATOGRAPHY (ERCP);  Surgeon: Rogene Houston, MD;  Location: [facility];  Service: Endoscopy;  Laterality: N/A;   ERCP N/A 12/27/2020   Procedure: ENDOSCOPIC RETROGRADE CHOLANGIOPANCREATOGRAPHY (ERCP);  Surgeon: Rogene Houston, MD;  Location: [facility];  Service: Endoscopy;  Laterality: N/A;   ESOPHAGOGASTRODUODENOSCOPY (EGD) WITH PROPOFOL N/A 11/30/2020   Procedure: ESOPHAGOGASTRODUODENOSCOPY (EGD) WITH PROPOFOL;  Surgeon: Milus Banister, MD;  Location: [facility];  Service: Endoscopy;  Laterality: N/A;   EUS N/A 11/30/2020   Procedure: UPPER ENDOSCOPIC ULTRASOUND (EUS) RADIAL;  Surgeon: Milus Banister,  MD;  Location: [facility];  Service: Endoscopy;  Laterality: N/A;   FINE NEEDLE ASPIRATION N/A 11/30/2020   Procedure: FINE NEEDLE ASPIRATION (FNA) LINEAR;  Surgeon: Milus Banister, MD;  Location: [facility];  Service: Endoscopy;  Laterality: N/A;   GIVENS CAPSULE STUDY     IR IMAGING GUIDED PORT INSERTION  12/18/2020   KNEE SURGERY     4 surgeries on left, 2 on right knee    SHOULDER SURGERY     right shoulder   SPHINCTEROTOMY N/A 11/09/2020   Procedure: SHORT BILIARY SPHINCTEROTOMY;  Surgeon: Rogene Houston, MD;  Location: [facility];  Service: Endoscopy;  Laterality: N/A;    SOCIAL HISTORY:  Social History   Socioeconomic History   Marital status: Married    Spouse name: Not on file   Number of children: Not on file   Years of education: Not on file   Highest education level: Not on file  Occupational History   Not on file  Tobacco Use   Smoking status: Every Day    Types: Cigars   Smokeless tobacco: Never   Tobacco comments:    Smoke 3-4 cigars a day  Vaping Use   Vaping Use: Never used  Substance and Sexual Activity   Alcohol use: Yes    Comment: occasional   Drug use: Never   Sexual activity: Not on file  Other Topics Concern   Not on file  Social History Narrative   Not on file   Social Determinants of Health   Financial Resource Strain: Low Risk    Difficulty of Paying Living Expenses: Not hard at all  Food Insecurity: No Food Insecurity   Worried About Charity fundraiser in the Last Year: Never true   [place] in the Last Year: Never true  Transportation Needs: No Transportation Needs   Lack of Transportation (Medical): No   Lack of Transportation (Non-Medical): No  Physical Activity: Sufficiently Active   Days of Exercise per Week: 5 days   Minutes of Exercise per Session: 30 min  Stress: Not on file  Social Connections: Not on file  Intimate Partner Violence: Not At Risk   Fear of Current or Ex-Partner: No   Emotionally Abused: No   Physically Abused: No   Sexually Abused: No    FAMILY HISTORY:  Family History  Problem Relation Age of Onset   Heart attack Father    Heart attack Paternal Grandfather    Leukemia Sister    Heart attack Brother     CURRENT MEDICATIONS:  Current Outpatient Medications  Medication Sig Dispense Refill   Continuous Blood Gluc Receiver (FREESTYLE LIBRE 2 READER) DEVI As directed  1 each 0   Continuous Blood Gluc Sensor (FREESTYLE LIBRE 2 SENSOR) MISC 1 Piece by Does not apply route every 14 (fourteen) days. 6 each 2   dronabinol (MARINOL) 5 MG capsule Take 1 capsule (5 mg total) by mouth 2 (two) times daily before a meal. 60 capsule 2   fluorouracil CALGB 03559 2,400 mg/m2 in sodium chloride 0.9 % 150 mL Inject 2,400 mg/m2 into the vein over 48 hr. Every 14 days     Glucosamine-Chondroitin (OSTEO BI-FLEX REGULAR STRENGTH PO) Take 2 tablets by mouth daily.     insulin detemir (LEVEMIR) 100 UNIT/ML FlexPen Inject 35 Units into the skin at bedtime. 45 mL 0   insulin lispro (HUMALOG KWIKPEN) 100 UNIT/ML KwikPen Inject 10-16 Units into the skin 3 (three) times daily before meals. 45 mL  1   IRINOTECAN HCL IV Inject 150 mg/m2 into the vein every 14 (fourteen) days.     IRON-VITAMIN C PO Take 1 tablet by mouth daily.     LEUCOVORIN CALCIUM IV Inject 400 mg/m2 into the vein every 14 (fourteen) days.     levothyroxine (SYNTHROID) 175 MCG tablet TAKE 1 TABLET DAILY (NEEDS VISIT IN [place]) 90 tablet 1   lidocaine-prilocaine (EMLA) cream Apply a small amount to port a cath site (do not rub in) and cover with plastic wrap 1 hour prior to infusion appointments 30 g 3   Melatonin-Pyridoxine (MELATIN PO) Take by mouth. Takes 2 qhs     ondansetron (ZOFRAN) 8 MG tablet Take 1 tablet (8 mg total) by mouth 2 (two) times daily as needed. Start on day 3 after chemotherapy. 30 tablet 1   OXALIPLATIN IV Inject 85 mg/m2 into the vein every 14 (fourteen) days.     oxyCODONE-acetaminophen (PERCOCET/ROXICET) 5-325 MG tablet Take 1 tablet by mouth every 6 (six) hours as needed for severe pain.     prochlorperazine (COMPAZINE) 10 MG tablet Take 10 mg by mouth every 6 (six) hours as needed for nausea or vomiting.     No current facility-administered medications for this visit.   Facility-Administered Medications Ordered in Other Visits  Medication Dose Route Frequency Provider Last Rate Last Admin    alteplase (CATHFLO ACTIVASE) injection 2 mg  2 mg Intracatheter Once PRN Derek Jack, MD       heparin lock flush 100 unit/mL  250 Units Intracatheter Once PRN Derek Jack, MD       sodium chloride flush (NS) 0.9 % injection 10 mL  10 mL Intracatheter PRN Derek Jack, MD   10 mL at 01/26/21 1314   sodium chloride flush (NS) 0.9 % injection 3 mL  3 mL Intracatheter PRN Derek Jack, MD        ALLERGIES:  Allergies  Allergen Reactions   Lisinopril Cough    PHYSICAL EXAM:  Performance status (ECOG): 1 - Symptomatic but completely ambulatory  There were no vitals filed for this visit. Wt Readings from Last 3 Encounters:  02/07/21 194 lb 8 oz (88.2 kg)  01/24/21 192 lb 12.8 oz (87.5 kg)  01/16/21 195 lb (88.5 kg)   Physical Exam Vitals reviewed.  Constitutional:      Appearance: Normal appearance.  Cardiovascular:     Rate and Rhythm: Normal rate and regular rhythm.     Pulses: Normal pulses.     Heart sounds: Normal heart sounds.  Pulmonary:     Effort: Pulmonary effort is normal.     Breath sounds: Normal breath sounds.  Abdominal:     Palpations: Abdomen is soft. There is no hepatomegaly, splenomegaly or mass.     Tenderness: There is no abdominal tenderness.  Neurological:     General: No focal deficit present.     Mental Status: He is alert and oriented to person, place, and time.  Psychiatric:        Mood and Affect: Mood normal.        Behavior: Behavior normal.    LABORATORY DATA:  I have reviewed the labs as listed.  CBC Latest Ref Rng & Units 02/07/2021 01/24/2021 01/10/2021  WBC 4.0 - 10.5 K/uL 14.3(H) 13.2(H) 7.2  Hemoglobin 13.0 - 17.0 g/dL 12.0(L) 12.3(L) 12.4(L)  Hematocrit 39.0 - 52.0 % 37.1(L) 38.7(L) 39.2  Platelets 150 - 400 K/uL 334 217 449(H)   CMP Latest Ref Rng & Units 02/07/2021 01/24/2021  01/10/2021  Glucose 70 - 99 mg/dL 203(H) 230(H) 219(H)  BUN 8 - 23 mg/dL _0 Creatinine 0.61 - 1.24 mg/dL 0.92 0.87 0.84   Sodium 135 - 145 mmol/L 134(L) 135 135  Potassium 3.5 - 5.1 mmol/L 4.2 3.8 4.2  Chloride 98 - 111 mmol/L 104 107 105  CO2 22 - 32 mmol/L _1 Calcium 8.9 - 10.3 mg/dL 8.1(L) 7.8(L) 8.4(L)  Total Protein 6.5 - 8.1 g/dL 6.4(L) 5.9(L) 6.5  Total Bilirubin 0.3 - 1.2 mg/dL 0.3 0.3 0.8  Alkaline Phos 38 - 126 U/L 146(H) 142(H) 120  AST 15 - 41 U/L 14(L) 14(L) 15  ALT 0 - 44 U/L _2 DIAGNOSTIC IMAGING:  I have independently reviewed the scans and discussed with the patient. No results found.   ASSESSMENT:  1.  Stage I (T2N0) pancreatic adenocarcinoma: - Recent admission to the hospital with DKA, found to have elevated liver enzymes. - MRCP without contrast on 11/08/2020 showed 2.3 x 2 cm central pancreatic head and uncinate mass with intra and extrahepatic biliary ductal dilation. - 11/09/2020-ERCP sphincterotomy and stenting with findings showing short high-grade distal CBD stricture with dilated CBD up to 14 mm.  Bile duct brushing was negative for malignant cells. - 11/10/2020-CA 19.9-367. - ERCP/EUS by [name] on 11/30/2020 with 2.9 cm mass in the head of the pancreas with unclear outer borders.  Given significant dilatation, indistinct borders, unable to confidently assess major vascular involvement.  Abutment and probable invasion of SMV and portal vein near the confluence with splenic vein.  No obvious SMA or celiac artery involvement.  No other adenopathy. - FNA of the pancreatic mass consistent with adenocarcinoma. - Weight loss intentional 25 pounds between January and April 1.  Additional 25 pound weight loss since 1 April, unintentional. - CT [facility] pancreatic protocol on 12/05/2020 with ill-defined mass in the pancreatic head and uncinate process measuring up to 1.9 x 2.1 cm with associated pancreatic ductal dilatation up to 8 mm.  No enlarged adenopathy.  Teardrop morphology of SMV indicative of SMV involvement.  Replaced right hepatic arterial supply arises from the SMA  passing immediately posterior to the tumor. - PET scan on 12/14/2020 with mass in the pancreatic head with hypermetabolic features, SUV 5.3 with no sign of metastatic disease. - Neoadjuvant FOLFIRINOX started on 01/10/2021.  2.  Social/family history: - He lives at home with his wife and is independent of all ADLs and IADLs. - He currently works in [employer].  No history of chemical exposure. - He previously smoked cigarettes.  Currently smokes cigars 4/day. - Maternal grandmother died at age 14 with malignancy, unknown type. - Sister died of acute leukemia.   PLAN:  1.  Stage I (T2N0) pancreatic adenocarcinoma: - He has completed 3 cycles of FOLFIRINOX. - After third cycle, he felt nauseous and vomited times two 1 day. - He had diarrhea starting night of 02/17/2021.  He had about 6-7 watery bowel movements yesterday.  He took 2 pills of Imodium yesterday.  He is afraid to take more Imodium as he might get constipated. - I have recommended him to take Imodium 2 tablets at the first onset of diarrhea. - He also reported cold sensitivity which was tolerable.  No mucositis.  Felt gassy in the abdomen. - Reviewed his labs today which showed elevated alk phos.  Electrolytes were normal.  Blood sugar was 174.  CBC was grossly normal. - Last CA 19-9 was 396. -  I will hold his treatment because of side effects and severe weakness. - He will receive IV fluids with electrolytes today. - Reevaluate in 1 week for cycle 4.  2.  Weight loss: - His weight has been stable.  However his eating has gone down. - Marinol is not helping. - We will start him on Megace 10 mL twice daily. - He is drinking 2 ensures per day and eating 3 meals per day.  3.  Diabetes: - Continue Humalog 10 to 12 units 3 times daily with meals.  Continue Levemir at bedtime.  4.  Paraspinal back pain: - Continue Percocet as needed.  Likely stent related.   Orders placed this encounter:  No orders of the defined types were placed  in this encounter.    Derek Jack, MD [facility] [phone]   I, Thana Ates, am acting as a scribe for Dr. Derek Jack.  I, Derek Jack MD, have reviewed the above documentation for accuracy and completeness, and I agree with the above.

## 2021-02-21 ENCOUNTER — Inpatient Hospital Stay (HOSPITAL_COMMUNITY): Payer: BC Managed Care – PPO

## 2021-02-21 ENCOUNTER — Inpatient Hospital Stay (HOSPITAL_BASED_OUTPATIENT_CLINIC_OR_DEPARTMENT_OTHER): Payer: BC Managed Care – PPO | Admitting: Hematology

## 2021-02-21 ENCOUNTER — Inpatient Hospital Stay (HOSPITAL_COMMUNITY): Payer: BC Managed Care – PPO | Attending: Hematology and Oncology

## 2021-02-21 VITALS — BP 108/68 | HR 91 | Temp 96.9°F | Resp 18

## 2021-02-21 VITALS — BP 106/71 | HR 99 | Temp 96.8°F | Resp 20 | Wt 191.1 lb

## 2021-02-21 DIAGNOSIS — F17210 Nicotine dependence, cigarettes, uncomplicated: Secondary | ICD-10-CM | POA: Insufficient documentation

## 2021-02-21 DIAGNOSIS — R200 Anesthesia of skin: Secondary | ICD-10-CM | POA: Insufficient documentation

## 2021-02-21 DIAGNOSIS — I10 Essential (primary) hypertension: Secondary | ICD-10-CM | POA: Diagnosis not present

## 2021-02-21 DIAGNOSIS — M5489 Other dorsalgia: Secondary | ICD-10-CM | POA: Insufficient documentation

## 2021-02-21 DIAGNOSIS — Z23 Encounter for immunization: Secondary | ICD-10-CM | POA: Insufficient documentation

## 2021-02-21 DIAGNOSIS — C250 Malignant neoplasm of head of pancreas: Secondary | ICD-10-CM | POA: Insufficient documentation

## 2021-02-21 DIAGNOSIS — Z806 Family history of leukemia: Secondary | ICD-10-CM | POA: Diagnosis not present

## 2021-02-21 DIAGNOSIS — E119 Type 2 diabetes mellitus without complications: Secondary | ICD-10-CM | POA: Insufficient documentation

## 2021-02-21 DIAGNOSIS — R109 Unspecified abdominal pain: Secondary | ICD-10-CM | POA: Insufficient documentation

## 2021-02-21 DIAGNOSIS — R634 Abnormal weight loss: Secondary | ICD-10-CM | POA: Insufficient documentation

## 2021-02-21 DIAGNOSIS — Z8249 Family history of ischemic heart disease and other diseases of the circulatory system: Secondary | ICD-10-CM | POA: Diagnosis not present

## 2021-02-21 DIAGNOSIS — Z5189 Encounter for other specified aftercare: Secondary | ICD-10-CM | POA: Diagnosis not present

## 2021-02-21 DIAGNOSIS — C259 Malignant neoplasm of pancreas, unspecified: Secondary | ICD-10-CM

## 2021-02-21 DIAGNOSIS — R5383 Other fatigue: Secondary | ICD-10-CM | POA: Diagnosis not present

## 2021-02-21 DIAGNOSIS — E785 Hyperlipidemia, unspecified: Secondary | ICD-10-CM | POA: Diagnosis not present

## 2021-02-21 DIAGNOSIS — Z5111 Encounter for antineoplastic chemotherapy: Secondary | ICD-10-CM | POA: Insufficient documentation

## 2021-02-21 DIAGNOSIS — Z95828 Presence of other vascular implants and grafts: Secondary | ICD-10-CM

## 2021-02-21 DIAGNOSIS — R42 Dizziness and giddiness: Secondary | ICD-10-CM | POA: Diagnosis not present

## 2021-02-21 DIAGNOSIS — Z79899 Other long term (current) drug therapy: Secondary | ICD-10-CM | POA: Insufficient documentation

## 2021-02-21 LAB — COMPREHENSIVE METABOLIC PANEL
ALT: 14 U/L (ref 0–44)
AST: 17 U/L (ref 15–41)
Albumin: 2.9 g/dL — ABNORMAL LOW (ref 3.5–5.0)
Alkaline Phosphatase: 161 U/L — ABNORMAL HIGH (ref 38–126)
Anion gap: 8 (ref 5–15)
BUN: 18 mg/dL (ref 8–23)
CO2: 21 mmol/L — ABNORMAL LOW (ref 22–32)
Calcium: 7.9 mg/dL — ABNORMAL LOW (ref 8.9–10.3)
Chloride: 104 mmol/L (ref 98–111)
Creatinine, Ser: 0.88 mg/dL (ref 0.61–1.24)
GFR, Estimated: >60 mL/min (ref >60)
Glucose, Bld: 174 mg/dL — ABNORMAL HIGH (ref 70–99)
Potassium: 4.2 mmol/L (ref 3.5–5.1)
Sodium: 133 mmol/L — ABNORMAL LOW (ref 135–145)
Total Bilirubin: 0.7 mg/dL (ref 0.3–1.2)
Total Protein: 6.3 g/dL — ABNORMAL LOW (ref 6.5–8.1)

## 2021-02-21 LAB — CBC WITH DIFFERENTIAL/PLATELET
Abs Immature Granulocytes: 0.24 10*3/uL — ABNORMAL HIGH (ref 0.00–0.07)
Basophils Absolute: 0.1 10*3/uL (ref 0.0–0.1)
Basophils Relative: 1 %
Eosinophils Absolute: 0.1 10*3/uL (ref 0.0–0.5)
Eosinophils Relative: 1 %
HCT: 38.6 % — ABNORMAL LOW (ref 39.0–52.0)
Hemoglobin: 12.6 g/dL — ABNORMAL LOW (ref 13.0–17.0)
Immature Granulocytes: 2 %
Lymphocytes Relative: 12 %
Lymphs Abs: 1.8 10*3/uL (ref 0.7–4.0)
MCH: 30.1 pg (ref 26.0–34.0)
MCHC: 32.6 g/dL (ref 30.0–36.0)
MCV: 92.3 fL (ref 80.0–100.0)
Monocytes Absolute: 1.5 10*3/uL — ABNORMAL HIGH (ref 0.1–1.0)
Monocytes Relative: 10 %
Neutro Abs: 11 10*3/uL — ABNORMAL HIGH (ref 1.7–7.7)
Neutrophils Relative %: 74 %
Platelets: 244 10*3/uL (ref 150–400)
RBC: 4.18 MIL/uL — ABNORMAL LOW (ref 4.22–5.81)
RDW: 16.9 % — ABNORMAL HIGH (ref 11.5–15.5)
WBC: 14.8 10*3/uL — ABNORMAL HIGH (ref 4.0–10.5)
nRBC: 0 % (ref 0.0–0.2)

## 2021-02-21 LAB — MAGNESIUM: Magnesium: 1.7 mg/dL (ref 1.7–2.4)

## 2021-02-21 MED ORDER — POTASSIUM CHLORIDE IN NACL 20-0.9 MEQ/L-% IV SOLN
Freq: Once | INTRAVENOUS | Status: AC
  Filled 2021-02-21: qty 1000

## 2021-02-21 MED ORDER — MEGESTROL ACETATE 400 MG/10ML PO SUSP: 400.0000 mg | Freq: Two times a day (BID) | ORAL | 0 refills | Status: DC

## 2021-02-21 MED ORDER — MAGNESIUM SULFATE 2 GM/50ML IV SOLN
2.0000 g | Freq: Once | INTRAVENOUS | Status: AC
  Administered 2021-02-21: 2 g via INTRAVENOUS
  Filled 2021-02-21: qty 50

## 2021-02-21 MED ORDER — SODIUM CHLORIDE 0.9% FLUSH
10.0000 mL | Freq: Once | INTRAVENOUS | Status: AC
  Administered 2021-02-21: 10 mL via INTRAVENOUS

## 2021-02-21 MED ORDER — HEPARIN SOD (PORK) LOCK FLUSH 100 UNIT/ML IV SOLN
500.0000 [IU] | Freq: Once | INTRAVENOUS | Status: AC
  Administered 2021-02-21: 500 [IU] via INTRAVENOUS

## 2021-02-21 NOTE — Progress Notes (Signed)
No treatment today per [name] d/t diarrhea. Received orders for house fluids.Patient tolerated infusion with no complaints voiced. Side effects with management reviewed with understanding verbalized. Port site clean and dry with no bruising or swelling noted at site. Good blood return noted before and after administration of therapy. Band aid applied. Patient left in satisfactory condition with VSS and no s/s of distress noted.

## 2021-02-21 NOTE — Patient Instructions (Signed)
[facility]  Discharge Instructions: Thank you for choosing [facility] to provide your oncology and hematology care.  If you have a lab appointment with the [facility], please come in thru the Main Entrance and check in at the main information desk.  Wear comfortable clothing and clothing appropriate for easy access to any Portacath or PICC line.   We strive to give you quality time with your provider. You may need to reschedule your appointment if you arrive late (15 or more minutes).  Arriving late affects you and other patients whose appointments are after yours.  Also, if you miss three or more appointments without notifying the office, you may be dismissed from the clinic at the provider's discretion.      For prescription refill requests, have your pharmacy contact our office and allow 72 hours for refills to be completed.    Today you received the following Hydration fluids. Return as scheduled.  To help prevent nausea and vomiting after your treatment, we encourage you to take your nausea medication as directed.  BELOW ARE SYMPTOMS THAT SHOULD BE REPORTED IMMEDIATELY: *FEVER GREATER THAN 100.4 F (38 C) OR HIGHER *CHILLS OR SWEATING *NAUSEA AND VOMITING THAT IS NOT CONTROLLED WITH YOUR NAUSEA MEDICATION *UNUSUAL SHORTNESS OF BREATH *UNUSUAL BRUISING OR BLEEDING *URINARY PROBLEMS (pain or burning when urinating, or frequent urination) *BOWEL PROBLEMS (unusual diarrhea, constipation, pain near the anus) TENDERNESS IN MOUTH AND THROAT WITH OR WITHOUT PRESENCE OF ULCERS (sore throat, sores in mouth, or a toothache) UNUSUAL RASH, SWELLING OR PAIN  UNUSUAL VAGINAL DISCHARGE OR ITCHING   Items with * indicate a potential emergency and should be followed up as soon as possible or go to the Emergency Department if any problems should occur.  Please show the CHEMOTHERAPY ALERT CARD or IMMUNOTHERAPY ALERT CARD at check-in to the Emergency Department and triage  nurse.  Should you have questions after your visit or need to cancel or reschedule your appointment, please contact [facility] [phone]  and follow the prompts.  Office hours are 8:00 a.m. to 4:30 p.m. Monday - Friday. Please note that voicemails left after 4:00 p.m. may not be returned until the following business day.  We are closed weekends and major holidays. You have access to a nurse at all times for urgent questions. Please call the main number to the clinic [phone] and follow the prompts.  For any non-urgent questions, you may also contact your provider using MyChart. We now offer e-Visits for anyone 64 and older to request care online for non-urgent symptoms. For details visit [email or web address].   Also download the MyChart app! Go to the app store, search "MyChart", open the app, select [facility], and log in with your MyChart username and password.  Due to Covid, a mask is required upon entering the hospital/clinic. If you do not have a mask, one will be given to you upon arrival. For doctor visits, patients may have 1 support person aged 30 or older with them. For treatment visits, patients cannot have anyone with them due to current Covid guidelines and our immunocompromised population.

## 2021-02-21 NOTE — Patient Instructions (Signed)
[place] at [facility] Discharge Instructions  You were seen and examined by [name] today. Hold treatment today and receive IV fluids due to excessive diarrhea. Return to the clinic next week as scheduled for office visit, lab work, and possible treatment.    Thank you for choosing [facility] at [facility] to provide your oncology and hematology care.  To afford each patient quality time with our provider, please arrive at least 15 minutes before your scheduled appointment time.   If you have a lab appointment with the [facility] please come in thru the Main Entrance and check in at the main information desk.  You need to re-schedule your appointment should you arrive 10 or more minutes late.  We strive to give you quality time with our providers, and arriving late affects you and other patients whose appointments are after yours.  Also, if you no show three or more times for appointments you may be dismissed from the clinic at the providers discretion.     Again, thank you for choosing [facility].  Our hope is that these requests will decrease the amount of time that you wait before being seen by our physicians.       _____________________________________________________________  Should you have questions after your visit to [facility], please contact our office at [phone] and follow the prompts.  Our office hours are 8:00 a.m. and 4:30 p.m. Monday - Friday.  Please note that voicemails left after 4:00 p.m. may not be returned until the following business day.  We are closed weekends and major holidays.  You do have access to a nurse 24-7, just call the main number to the clinic [phone] and do not press any options, hold on the line and a nurse will answer the phone.    For prescription refill requests, have your pharmacy contact our office and allow 72 hours.    Due to Covid, you will need to  wear a mask upon entering the hospital. If you do not have a mask, a mask will be given to you at the Main Entrance upon arrival. For doctor visits, patients may have 1 support person age 47 or older with them. For treatment visits, patients can not have anyone with them due to social distancing guidelines and our immunocompromised population.

## 2021-02-23 ENCOUNTER — Encounter (HOSPITAL_COMMUNITY): Payer: Self-pay | Admitting: Hematology

## 2021-02-23 ENCOUNTER — Encounter (HOSPITAL_COMMUNITY): Payer: Self-pay

## 2021-02-23 ENCOUNTER — Inpatient Hospital Stay (HOSPITAL_COMMUNITY): Payer: BC Managed Care – PPO

## 2021-02-27 NOTE — Progress Notes (Signed)
[place], [place] [address]   CLINIC:  Medical Oncology/Hematology  PCP:  [name], MD [address] [place] / [place] [address] [phone]   REASON FOR VISIT:  Follow-up for pancreatic adenocarcinoma  PRIOR THERAPY: none  NGS Results: not done  CURRENT THERAPY: FOLFIRINOX every 2 weeks x 4 cycles  BRIEF ONCOLOGIC HISTORY:  Oncology History  Pancreatic adenocarcinoma ([place])  12/04/2020 Initial Diagnosis   Pancreatic adenocarcinoma ([place])   01/10/2021 -  Chemotherapy   Patient is on Treatment Plan : PANCREAS Modified FOLFIRINOX q14d x 4 cycles       CANCER STAGING: Cancer Staging Pancreatic adenocarcinoma ([place]) Staging form: Exocrine Pancreas, AJCC 8th Edition - Clinical stage from 12/04/2020: Stage IB (cT2, cN0, cM0) - Unsigned   INTERVAL HISTORY:  Alexander Banks, a 65 y.o. male, returns for routine follow-up and consideration for next cycle of chemotherapy. Alexander Banks was last seen on 02/21/2021.  Due for cycle #4 of FOLFIRINOX today.   Overall, he tells me he has been feeling pretty well. He reports his energy and appetite has improved. He has no yet started Megace, and he is drinking 1-2 Boost accompanied by 3 meals a day. He denies leg swellings, nausea, vomiting, tingling/numbness, mouth sores, back pain, and abdominal pain. He had one episode of loose stool which was resolved with 1 tablet of Imodium. He reports mild but improved bloating. He reports cold sensitivity for 4 days following treatment.   Overall, he feels ready for next cycle of chemo today.   REVIEW OF SYSTEMS:  Review of Systems  Constitutional:  Negative for appetite change (50%), fatigue (75%) and unexpected weight change (+2 lbs).  HENT:   Negative for mouth sores.   Cardiovascular:  Negative for leg swelling.  Gastrointestinal:  Negative for abdominal pain, diarrhea (resolved), nausea and vomiting.  Musculoskeletal:  Negative for back  pain.  Neurological:  Negative for numbness.  All other systems reviewed and are negative.  PAST MEDICAL/SURGICAL HISTORY:  Past Medical History:  Diagnosis Date   Anemia, iron deficiency    Diabetes mellitus without complication ([place])    on meds   Hearing loss    Bil hearing aids   Hyperlipidemia    Hypertension    Port-A-Cath in place 12/26/2020   Prediabetes    Sleep apnea    Past Surgical History:  Procedure Laterality Date   BILIARY STENT PLACEMENT N/A 11/09/2020   Procedure: BILIARY STENT PLACEMENT - 28 F X 5 CM STENT;  Surgeon: Rogene Houston, MD;  Location: [facility];  Service: Endoscopy;  Laterality: N/A;   BILIARY STENT PLACEMENT N/A 12/27/2020   Procedure: BILIARY STENT PLACEMENT;  Surgeon: Rogene Houston, MD;  Location: [facility];  Service: Endoscopy;  Laterality: N/A;   COLONOSCOPY     ERCP N/A 11/09/2020   Procedure: ENDOSCOPIC RETROGRADE CHOLANGIOPANCREATOGRAPHY (ERCP);  Surgeon: Rogene Houston, MD;  Location: [facility];  Service: Endoscopy;  Laterality: N/A;   ERCP N/A 12/27/2020   Procedure: ENDOSCOPIC RETROGRADE CHOLANGIOPANCREATOGRAPHY (ERCP);  Surgeon: Rogene Houston, MD;  Location: [facility];  Service: Endoscopy;  Laterality: N/A;   ESOPHAGOGASTRODUODENOSCOPY (EGD) WITH PROPOFOL N/A 11/30/2020   Procedure: ESOPHAGOGASTRODUODENOSCOPY (EGD) WITH PROPOFOL;  Surgeon: Milus Banister, MD;  Location: [facility];  Service: Endoscopy;  Laterality: N/A;   EUS N/A 11/30/2020   Procedure: UPPER ENDOSCOPIC ULTRASOUND (EUS) RADIAL;  Surgeon: Milus Banister, MD;  Location: [facility];  Service: Endoscopy;  Laterality:  N/A;   FINE NEEDLE ASPIRATION N/A 11/30/2020   Procedure: FINE NEEDLE ASPIRATION (FNA) LINEAR;  Surgeon: Milus Banister, MD;  Location: [facility];  Service: Endoscopy;  Laterality: N/A;   GIVENS CAPSULE STUDY     IR IMAGING GUIDED PORT INSERTION  12/18/2020   KNEE SURGERY     4 surgeries on left, 2 on right knee   SHOULDER SURGERY     right shoulder    SPHINCTEROTOMY N/A 11/09/2020   Procedure: SHORT BILIARY SPHINCTEROTOMY;  Surgeon: Rogene Houston, MD;  Location: [facility];  Service: Endoscopy;  Laterality: N/A;    SOCIAL HISTORY:  Social History   Socioeconomic History   Marital status: Married    Spouse name: Not on file   Number of children: Not on file   Years of education: Not on file   Highest education level: Not on file  Occupational History   Not on file  Tobacco Use   Smoking status: Every Day    Types: Cigars   Smokeless tobacco: Never   Tobacco comments:    Smoke 3-4 cigars a day  Vaping Use   Vaping Use: Never used  Substance and Sexual Activity   Alcohol use: Yes    Comment: occasional   Drug use: Never   Sexual activity: Not on file  Other Topics Concern   Not on file  Social History Narrative   Not on file   Social Determinants of Health   Financial Resource Strain: Low Risk    Difficulty of Paying Living Expenses: Not hard at all  Food Insecurity: No Food Insecurity   Worried About Charity fundraiser in the Last Year: Never true   [place] in the Last Year: Never true  Transportation Needs: No Transportation Needs   Lack of Transportation (Medical): No   Lack of Transportation (Non-Medical): No  Physical Activity: Sufficiently Active   Days of Exercise per Week: 5 days   Minutes of Exercise per Session: 30 min  Stress: Not on file  Social Connections: Not on file  Intimate Partner Violence: Not At Risk   Fear of Current or Ex-Partner: No   Emotionally Abused: No   Physically Abused: No   Sexually Abused: No    FAMILY HISTORY:  Family History  Problem Relation Age of Onset   Heart attack Father    Heart attack Paternal Grandfather    Leukemia Sister    Heart attack Brother     CURRENT MEDICATIONS:  Current Outpatient Medications  Medication Sig Dispense Refill   Continuous Blood Gluc Receiver (FREESTYLE LIBRE 2 READER) DEVI As directed 1 each 0   Continuous Blood Gluc Sensor  (FREESTYLE LIBRE 2 SENSOR) MISC 1 Piece by Does not apply route every 14 (fourteen) days. 6 each 2   fluorouracil CALGB 79892 2,400 mg/m2 in sodium chloride 0.9 % 150 mL Inject 2,400 mg/m2 into the vein over 48 hr. Every 14 days     Glucosamine-Chondroitin (OSTEO BI-FLEX REGULAR STRENGTH PO) Take 2 tablets by mouth daily.     insulin detemir (LEVEMIR) 100 UNIT/ML FlexPen Inject 35 Units into the skin at bedtime. 45 mL 0   insulin lispro (HUMALOG KWIKPEN) 100 UNIT/ML KwikPen Inject 10-16 Units into the skin 3 (three) times daily before meals. 45 mL 1   IRINOTECAN HCL IV Inject 150 mg/m2 into the vein every 14 (fourteen) days.     IRON-VITAMIN C PO Take 1 tablet by mouth daily.     LEUCOVORIN CALCIUM  IV Inject 400 mg/m2 into the vein every 14 (fourteen) days.     levothyroxine (SYNTHROID) 175 MCG tablet TAKE 1 TABLET DAILY (NEEDS VISIT IN [place]) 90 tablet 1   lidocaine-prilocaine (EMLA) cream Apply a small amount to port a cath site (do not rub in) and cover with plastic wrap 1 hour prior to infusion appointments 30 g 3   megestrol (MEGACE) 400 MG/10ML suspension Take 10 mLs (400 mg total) by mouth 2 (two) times daily. 240 mL 0   Melatonin-Pyridoxine (MELATIN PO) Take by mouth. Takes 2 qhs     ondansetron (ZOFRAN) 8 MG tablet Take 1 tablet (8 mg total) by mouth 2 (two) times daily as needed. Start on day 3 after chemotherapy. (Patient not taking: Reported on 02/21/2021) 30 tablet 1   OXALIPLATIN IV Inject 85 mg/m2 into the vein every 14 (fourteen) days.     oxyCODONE-acetaminophen (PERCOCET/ROXICET) 5-325 MG tablet Take 1 tablet by mouth every 6 (six) hours as needed for severe pain. (Patient not taking: Reported on 02/21/2021)     prochlorperazine (COMPAZINE) 10 MG tablet Take 10 mg by mouth every 6 (six) hours as needed for nausea or vomiting. (Patient not taking: Reported on 02/21/2021)     No current facility-administered medications for this visit.   Facility-Administered Medications Ordered in  Other Visits  Medication Dose Route Frequency Provider Last Rate Last Admin   alteplase (CATHFLO ACTIVASE) injection 2 mg  2 mg Intracatheter Once PRN Derek Jack, MD       heparin lock flush 100 unit/mL  250 Units Intracatheter Once PRN Derek Jack, MD       sodium chloride flush (NS) 0.9 % injection 10 mL  10 mL Intracatheter PRN Derek Jack, MD   10 mL at 01/26/21 1314   sodium chloride flush (NS) 0.9 % injection 3 mL  3 mL Intracatheter PRN Derek Jack, MD        ALLERGIES:  Allergies  Allergen Reactions   Lisinopril Cough    PHYSICAL EXAM:  Performance status (ECOG): 1 - Symptomatic but completely ambulatory  There were no vitals filed for this visit. Wt Readings from Last 3 Encounters:  02/21/21 191 lb 2.2 oz (86.7 kg)  02/07/21 194 lb 8 oz (88.2 kg)  01/24/21 192 lb 12.8 oz (87.5 kg)   Physical Exam Vitals reviewed.  Constitutional:      Appearance: Normal appearance.  Cardiovascular:     Rate and Rhythm: Normal rate and regular rhythm.     Pulses: Normal pulses.     Heart sounds: Normal heart sounds.  Pulmonary:     Effort: Pulmonary effort is normal.     Breath sounds: Normal breath sounds.  Abdominal:     Palpations: Abdomen is soft. There is no hepatomegaly, splenomegaly or mass.     Tenderness: There is no abdominal tenderness.  Musculoskeletal:     Right lower leg: No edema.     Left lower leg: No edema.  Neurological:     General: No focal deficit present.     Mental Status: He is alert and oriented to person, place, and time.  Psychiatric:        Mood and Affect: Mood normal.        Behavior: Behavior normal.    LABORATORY DATA:  I have reviewed the labs as listed.  CBC Latest Ref Rng & Units 02/21/2021 02/07/2021 01/24/2021  WBC 4.0 - 10.5 K/uL 14.8(H) 14.3(H) 13.2(H)  Hemoglobin 13.0 - 17.0 g/dL 12.6(L) 12.0(L) 12.3(L)  Hematocrit 39.0 - 52.0 % 38.6(L) 37.1(L) 38.7(L)  Platelets 150 - 400 K/uL 244 334 217   CMP  Latest Ref Rng & Units 02/21/2021 02/07/2021 01/24/2021  Glucose 70 - 99 mg/dL 174(H) 203(H) 230(H)  BUN 8 - 23 mg/dL 18 15 14   Creatinine 0.61 - 1.24 mg/dL 0.88 0.92 0.87  Sodium 135 - 145 mmol/L 133(L) 134(L) 135  Potassium 3.5 - 5.1 mmol/L 4.2 4.2 3.8  Chloride 98 - 111 mmol/L 104 104 107  CO2 22 - 32 mmol/L 21(L) 24 22  Calcium 8.9 - 10.3 mg/dL 7.9(L) 8.1(L) 7.8(L)  Total Protein 6.5 - 8.1 g/dL 6.3(L) 6.4(L) 5.9(L)  Total Bilirubin 0.3 - 1.2 mg/dL 0.7 0.3 0.3  Alkaline Phos 38 - 126 U/L 161(H) 146(H) 142(H)  AST 15 - 41 U/L 17 14(L) 14(L)  ALT 0 - 44 U/L 14 13 13     DIAGNOSTIC IMAGING:  I have independently reviewed the scans and discussed with the patient. No results found.   ASSESSMENT:  1.  Stage I (T2N0) pancreatic adenocarcinoma: - Recent admission to the hospital with DKA, found to have elevated liver enzymes. - MRCP without contrast on 11/08/2020 showed 2.3 x 2 cm central pancreatic head and uncinate mass with intra and extrahepatic biliary ductal dilation. - 11/09/2020-ERCP sphincterotomy and stenting with findings showing short high-grade distal CBD stricture with dilated CBD up to 14 mm.  Bile duct brushing was negative for malignant cells. - 11/10/2020-CA 19.9-367. - ERCP/EUS by [name] on 11/30/2020 with 2.9 cm mass in the head of the pancreas with unclear outer borders.  Given significant dilatation, indistinct borders, unable to confidently assess major vascular involvement.  Abutment and probable invasion of SMV and portal vein near the confluence with splenic vein.  No obvious SMA or celiac artery involvement.  No other adenopathy. - FNA of the pancreatic mass consistent with adenocarcinoma. - Weight loss intentional 25 pounds between January and April 1.  Additional 25 pound weight loss since 1 April, unintentional. - CT [facility] pancreatic protocol on 12/05/2020 with ill-defined mass in the pancreatic head and uncinate process measuring up to 1.9 x 2.1 cm with associated  pancreatic ductal dilatation up to 8 mm.  No enlarged adenopathy.  Teardrop morphology of SMV indicative of SMV involvement.  Replaced right hepatic arterial supply arises from the SMA passing immediately posterior to the tumor. - PET scan on 12/14/2020 with mass in the pancreatic head with hypermetabolic features, SUV 5.3 with no sign of metastatic disease. - Neoadjuvant FOLFIRINOX started on 01/10/2021.  2.  Social/family history: - He lives at home with his wife and is independent of all ADLs and IADLs. - He currently works in [employer].  No history of chemical exposure. - He previously smoked cigarettes.  Currently smokes cigars 4/day. - Maternal grandmother died at age 50 with malignancy, unknown type. - Sister died of acute leukemia.   PLAN:  1.  Stage I (T2N0) pancreatic adenocarcinoma: - We held his treatment last week after cycle 3 of FOLFIRINOX as he had GI side effects including diarrhea, nausea and vomiting, extreme fatigue and weight loss. - In the last 1 week, his energy levels improved.  He is eating better.  No nausea or vomiting.  He had 1 episode of diarrhea and Imodium helped.  Denied any tingling or numbness in EXTR. - He had cold sensitivity lasted for 4 days after cycle 3. - Because of his diarrhea, I will cut back on Irinotecan by 20% (120 mg/m2). - I have  reviewed his labs today.  Albumin is 2.8 and alk phos 164.  Rest of the LFTs are normal.  Electrolytes were normal.  Proceed with cycle 4 with dose modifications. - RTC 2 weeks for follow-up.  We will check CA 19-9 level along with other labs. - We will also consider germline mutation testing.  2.  Weight loss: - He is feeling much better since last 1 week.  He is eating better.  He gained about 2 pounds.  Marinol did not help in the past. - He did not fill the Megace prescription which we gave last week.  He is eating 3 meals and drinking about 1 to 2 cans of Ensure per day.  3.  Diabetes: - Continue Humalog 10 to 12  units 3 times daily with meals.  Continue Levemir at bedtime.  He is not requiring Humalog in the morning says his blood sugar is remaining around 100.  4.  Paraspinal back pain: - Likely stent related.  Continue Percocet as needed.  Did not require any in the last 1 week.   Orders placed this encounter:  No orders of the defined types were placed in this encounter.    Derek Jack, MD [place] [phone]   I, Thana Ates, am acting as a scribe for Dr. Derek Jack.  I, Derek Jack MD, have reviewed the above documentation for accuracy and completeness, and I agree with the above.

## 2021-02-28 ENCOUNTER — Other Ambulatory Visit: Payer: Self-pay

## 2021-02-28 ENCOUNTER — Inpatient Hospital Stay (HOSPITAL_COMMUNITY): Payer: BC Managed Care – PPO

## 2021-02-28 ENCOUNTER — Inpatient Hospital Stay (HOSPITAL_BASED_OUTPATIENT_CLINIC_OR_DEPARTMENT_OTHER): Payer: BC Managed Care – PPO | Admitting: Hematology

## 2021-02-28 ENCOUNTER — Encounter (HOSPITAL_COMMUNITY): Payer: Self-pay | Admitting: Hematology

## 2021-02-28 VITALS — BP 118/80 | HR 103 | Temp 96.8°F | Resp 17 | Wt 193.3 lb

## 2021-02-28 VITALS — BP 123/76 | HR 93 | Temp 96.9°F | Resp 18

## 2021-02-28 DIAGNOSIS — C259 Malignant neoplasm of pancreas, unspecified: Secondary | ICD-10-CM

## 2021-02-28 DIAGNOSIS — R42 Dizziness and giddiness: Secondary | ICD-10-CM | POA: Diagnosis not present

## 2021-02-28 DIAGNOSIS — I10 Essential (primary) hypertension: Secondary | ICD-10-CM | POA: Diagnosis not present

## 2021-02-28 DIAGNOSIS — R634 Abnormal weight loss: Secondary | ICD-10-CM | POA: Diagnosis not present

## 2021-02-28 DIAGNOSIS — Z23 Encounter for immunization: Secondary | ICD-10-CM | POA: Diagnosis not present

## 2021-02-28 DIAGNOSIS — R109 Unspecified abdominal pain: Secondary | ICD-10-CM | POA: Diagnosis not present

## 2021-02-28 DIAGNOSIS — C250 Malignant neoplasm of head of pancreas: Secondary | ICD-10-CM | POA: Diagnosis not present

## 2021-02-28 DIAGNOSIS — Z5189 Encounter for other specified aftercare: Secondary | ICD-10-CM | POA: Diagnosis not present

## 2021-02-28 DIAGNOSIS — Z95828 Presence of other vascular implants and grafts: Secondary | ICD-10-CM

## 2021-02-28 DIAGNOSIS — F17210 Nicotine dependence, cigarettes, uncomplicated: Secondary | ICD-10-CM | POA: Diagnosis not present

## 2021-02-28 DIAGNOSIS — M5489 Other dorsalgia: Secondary | ICD-10-CM | POA: Diagnosis not present

## 2021-02-28 DIAGNOSIS — R200 Anesthesia of skin: Secondary | ICD-10-CM | POA: Diagnosis not present

## 2021-02-28 DIAGNOSIS — E785 Hyperlipidemia, unspecified: Secondary | ICD-10-CM | POA: Diagnosis not present

## 2021-02-28 DIAGNOSIS — Z5111 Encounter for antineoplastic chemotherapy: Secondary | ICD-10-CM | POA: Diagnosis not present

## 2021-02-28 DIAGNOSIS — R5383 Other fatigue: Secondary | ICD-10-CM | POA: Diagnosis not present

## 2021-02-28 DIAGNOSIS — Z79899 Other long term (current) drug therapy: Secondary | ICD-10-CM | POA: Diagnosis not present

## 2021-02-28 DIAGNOSIS — E119 Type 2 diabetes mellitus without complications: Secondary | ICD-10-CM | POA: Diagnosis not present

## 2021-02-28 DIAGNOSIS — Z8249 Family history of ischemic heart disease and other diseases of the circulatory system: Secondary | ICD-10-CM | POA: Diagnosis not present

## 2021-02-28 LAB — COMPREHENSIVE METABOLIC PANEL
ALT: 21 U/L (ref 0–44)
AST: 22 U/L (ref 15–41)
Albumin: 2.8 g/dL — ABNORMAL LOW (ref 3.5–5.0)
Alkaline Phosphatase: 164 U/L — ABNORMAL HIGH (ref 38–126)
Anion gap: 7 (ref 5–15)
BUN: 14 mg/dL (ref 8–23)
CO2: 23 mmol/L (ref 22–32)
Calcium: 7.8 mg/dL — ABNORMAL LOW (ref 8.9–10.3)
Chloride: 106 mmol/L (ref 98–111)
Creatinine, Ser: 0.77 mg/dL (ref 0.61–1.24)
GFR, Estimated: >60 mL/min (ref >60)
Glucose, Bld: 157 mg/dL — ABNORMAL HIGH (ref 70–99)
Potassium: 3.9 mmol/L (ref 3.5–5.1)
Sodium: 136 mmol/L (ref 135–145)
Total Bilirubin: 0.5 mg/dL (ref 0.3–1.2)
Total Protein: 5.9 g/dL — ABNORMAL LOW (ref 6.5–8.1)

## 2021-02-28 LAB — CBC WITH DIFFERENTIAL/PLATELET
Abs Immature Granulocytes: 0.45 10*3/uL — ABNORMAL HIGH (ref 0.00–0.07)
Basophils Absolute: 0.1 10*3/uL (ref 0.0–0.1)
Basophils Relative: 1 %
Eosinophils Absolute: 0.1 10*3/uL (ref 0.0–0.5)
Eosinophils Relative: 1 %
HCT: 37.3 % — ABNORMAL LOW (ref 39.0–52.0)
Hemoglobin: 12.3 g/dL — ABNORMAL LOW (ref 13.0–17.0)
Immature Granulocytes: 4 %
Lymphocytes Relative: 13 %
Lymphs Abs: 1.6 10*3/uL (ref 0.7–4.0)
MCH: 30.2 pg (ref 26.0–34.0)
MCHC: 33 g/dL (ref 30.0–36.0)
MCV: 91.6 fL (ref 80.0–100.0)
Monocytes Absolute: 1.3 10*3/uL — ABNORMAL HIGH (ref 0.1–1.0)
Monocytes Relative: 11 %
Neutro Abs: 8.8 10*3/uL — ABNORMAL HIGH (ref 1.7–7.7)
Neutrophils Relative %: 70 %
Platelets: 377 10*3/uL (ref 150–400)
RBC: 4.07 MIL/uL — ABNORMAL LOW (ref 4.22–5.81)
RDW: 17.6 % — ABNORMAL HIGH (ref 11.5–15.5)
WBC: 12.4 10*3/uL — ABNORMAL HIGH (ref 4.0–10.5)
nRBC: 0 % (ref 0.0–0.2)

## 2021-02-28 LAB — MAGNESIUM: Magnesium: 1.7 mg/dL (ref 1.7–2.4)

## 2021-02-28 MED ORDER — SODIUM CHLORIDE 0.9 % IV SOLN
2350.0000 mg/m2 | INTRAVENOUS | Status: DC
  Administered 2021-02-28: 5000 mg via INTRAVENOUS
  Filled 2021-02-28: qty 100

## 2021-02-28 MED ORDER — SODIUM CHLORIDE 0.9 % IV SOLN
400.0000 mg/m2 | Freq: Once | INTRAVENOUS | Status: AC
  Administered 2021-02-28: 852 mg via INTRAVENOUS
  Filled 2021-02-28: qty 42.6

## 2021-02-28 MED ORDER — SODIUM CHLORIDE 0.9 % IV SOLN
10.0000 mg | Freq: Once | INTRAVENOUS | Status: AC
  Administered 2021-02-28: 10 mg via INTRAVENOUS
  Filled 2021-02-28: qty 10

## 2021-02-28 MED ORDER — ATROPINE SULFATE 1 MG/ML IV SOLN
0.5000 mg | Freq: Once | INTRAVENOUS | Status: AC
  Administered 2021-02-28: 0.5 mg via INTRAVENOUS

## 2021-02-28 MED ORDER — DEXTROSE 5 % IV SOLN: Freq: Once | INTRAVENOUS | Status: AC

## 2021-02-28 MED ORDER — SODIUM CHLORIDE 0.9 % IV SOLN
120.0000 mg/m2 | Freq: Once | INTRAVENOUS | Status: AC
  Administered 2021-02-28: 260 mg via INTRAVENOUS
  Filled 2021-02-28: qty 13

## 2021-02-28 MED ORDER — ATROPINE SULFATE 1 MG/ML IV SOLN
0.5000 mg | Freq: Once | INTRAVENOUS | Status: DC | PRN
  Filled 2021-02-28: qty 1

## 2021-02-28 MED ORDER — SODIUM CHLORIDE 0.9 % IV SOLN
150.0000 mg | Freq: Once | INTRAVENOUS | Status: AC
  Administered 2021-02-28: 150 mg via INTRAVENOUS
  Filled 2021-02-28: qty 150

## 2021-02-28 MED ORDER — INFLUENZA VAC SPLIT QUAD 0.5 ML IM SUSY
0.5000 mL | PREFILLED_SYRINGE | Freq: Once | INTRAMUSCULAR | Status: AC
  Administered 2021-02-28: 0.5 mL via INTRAMUSCULAR
  Filled 2021-02-28: qty 0.5

## 2021-02-28 MED ORDER — PALONOSETRON HCL INJECTION 0.25 MG/5ML
0.2500 mg | Freq: Once | INTRAVENOUS | Status: AC
  Administered 2021-02-28: 0.25 mg via INTRAVENOUS
  Filled 2021-02-28: qty 5

## 2021-02-28 MED ORDER — OXALIPLATIN CHEMO INJECTION 100 MG/20ML
85.0000 mg/m2 | Freq: Once | INTRAVENOUS | Status: AC
  Administered 2021-02-28: 180 mg via INTRAVENOUS
  Filled 2021-02-28: qty 36

## 2021-02-28 MED ORDER — SODIUM CHLORIDE 0.9 % IV SOLN: Freq: Once | INTRAVENOUS | Status: AC

## 2021-02-28 MED ORDER — SODIUM CHLORIDE 0.9 % IV SOLN
Freq: Once | INTRAVENOUS | Status: AC
Start: 2021-02-28 — End: 2021-02-28

## 2021-02-28 NOTE — Progress Notes (Signed)
Patient has been examined, vital signs and labs have been reviewed by Dr. Katragadda. ANC, Creatinine, LFTs, hemoglobin, and platelets are within treatment parameters per Dr. Katragadda. Patient may proceed with treatment per M.D.   

## 2021-02-28 NOTE — Progress Notes (Signed)
Ok to treat with HR 103  T.O. Dr Rhys Martini, PharmD

## 2021-02-28 NOTE — Patient Instructions (Signed)
[place] at [facility] Discharge Instructions  You were seen and examined by [name] today.   We will proceed with cycle 4 of treatment today with dose-reduced irinotecan.  Return as scheduled in 2 weeks for lab work, office visit, and treatment.    Thank you for choosing [place] at [facility] to provide your oncology and hematology care.  To afford each patient quality time with our provider, please arrive at least 15 minutes before your scheduled appointment time.   If you have a lab appointment with the [facility] please come in thru the Main Entrance and check in at the main information desk.  You need to re-schedule your appointment should you arrive 10 or more minutes late.  We strive to give you quality time with our providers, and arriving late affects you and other patients whose appointments are after yours.  Also, if you no show three or more times for appointments you may be dismissed from the clinic at the providers discretion.     Again, thank you for choosing [facility]  Our hope is that these requests will decrease the amount of time that you wait before being seen by our physicians.       _____________________________________________________________  Should you have questions after your visit to [facility], please contact our office at [phone] and follow the prompts.  Our office hours are 8:00 a.m. and 4:30 p.m. Monday - Friday.  Please note that voicemails left after 4:00 p.m. may not be returned until the following business day.  We are closed weekends and major holidays.  You do have access to a nurse 24-7, just call the main number to the clinic [phone] and do not press any options, hold on the line and a nurse will answer the phone.    For prescription refill requests, have your pharmacy contact our office and allow 72 hours.    Due to Covid, you will need to wear a  mask upon entering the hospital. If you do not have a mask, a mask will be given to you at the Main Entrance upon arrival. For doctor visits, patients may have 1 support person age 24 or older with them. For treatment visits, patients can not have anyone with them due to social distancing guidelines and our immunocompromised population.

## 2021-02-28 NOTE — Patient Instructions (Signed)
[place]  Discharge Instructions: Thank you for choosing [facility] to provide your oncology and hematology care.  If you have a lab appointment with the [facility], please come in thru the Main Entrance and check in at the main information desk.  Wear comfortable clothing and clothing appropriate for easy access to any Portacath or PICC line.   We strive to give you quality time with your provider. You may need to reschedule your appointment if you arrive late (15 or more minutes).  Arriving late affects you and other patients whose appointments are after yours.  Also, if you miss three or more appointments without notifying the office, you may be dismissed from the clinic at the provider's discretion.      For prescription refill requests, have your pharmacy contact our office and allow 72 hours for refills to be completed.    Today you received the following chemotherapy and/or immunotherapy agents Folfirinox    To help prevent nausea and vomiting after your treatment, we encourage you to take your nausea medication as directed.  The chemotherapy medication bag should finish at 46 hours, 96 hours, or 7 days. For example, if your pump is scheduled for 46 hours and it was put on at 4:00 p.m., it should finish at 2:00 p.m. the day it is scheduled to come off regardless of your appointment time.     Estimated time to finish at 1305pm.   If the display on your pump reads "Low Volume" and it is beeping, take the batteries out of the pump and come to the cancer center for it to be taken off.   If the pump alarms go off prior to the pump reading "Low Volume" then call [phone] and someone can assist you.  If the plunger comes out and the chemotherapy medication is leaking out, please use your home chemo spill kit to clean up the spill. Do NOT use paper towels or other household products.  If you have problems or questions regarding your pump, please call  either [phone] (24 hours a day) or the cancer center Monday-Friday 8:00 a.m.- 4:30 p.m. at the clinic number and we will assist you. If you are unable to get assistance, then go to the nearest Emergency Department and ask the staff to contact the IV team for assistance.    BELOW ARE SYMPTOMS THAT SHOULD BE REPORTED IMMEDIATELY: *FEVER GREATER THAN 100.4 F (38 C) OR HIGHER *CHILLS OR SWEATING *NAUSEA AND VOMITING THAT IS NOT CONTROLLED WITH YOUR NAUSEA MEDICATION *UNUSUAL SHORTNESS OF BREATH *UNUSUAL BRUISING OR BLEEDING *URINARY PROBLEMS (pain or burning when urinating, or frequent urination) *BOWEL PROBLEMS (unusual diarrhea, constipation, pain near the anus) TENDERNESS IN MOUTH AND THROAT WITH OR WITHOUT PRESENCE OF ULCERS (sore throat, sores in mouth, or a toothache) UNUSUAL RASH, SWELLING OR PAIN  UNUSUAL VAGINAL DISCHARGE OR ITCHING   Items with * indicate a potential emergency and should be followed up as soon as possible or go to the Emergency Department if any problems should occur.  Please show the CHEMOTHERAPY ALERT CARD or IMMUNOTHERAPY ALERT CARD at check-in to the Emergency Department and triage nurse.  Should you have questions after your visit or need to cancel or reschedule your appointment, please contact [facility] [phone]  and follow the prompts.  Office hours are 8:00 a.m. to 4:30 p.m. Monday - Friday. Please note that voicemails left after 4:00 p.m. may not be returned until the following business day.  We are closed weekends and major holidays. You have access to a nurse at all times for urgent questions. Please call the main number to the clinic [phone] and follow the prompts.  For any non-urgent questions, you may also contact your provider using MyChart. We now offer e-Visits for anyone 22 and older to request care online for non-urgent symptoms. For details visit [email or web address].   Also download the MyChart app! Go to the app  store, search "MyChart", open the app, select , and log in with your MyChart username and password.  Due to Covid, a mask is required upon entering the hospital/clinic. If you do not have a mask, one will be given to you upon arrival. For doctor visits, patients may have 1 support person aged 62 or older with them. For treatment visits, patients cannot have anyone with them due to current Covid guidelines and our immunocompromised population.

## 2021-02-28 NOTE — Progress Notes (Signed)
Pt presents today for Folfirinox per provider's order. Vital signs stable and labs WNL for treatment today. Okay to proceed with treatment today per Dr. Raliegh Ip. Re-checked HR 92 okay to proceed with  treatment.  Folfirinox given today per MD orders. Tolerated infusion without adverse affects. Vital signs stable. No complaints at this time. Discharged from clinic ambulatory in stable condition. Alert and oriented x 3. F/U with [facility] as scheduled. 5FU ambulatory pump infusing.

## 2021-03-02 ENCOUNTER — Inpatient Hospital Stay (HOSPITAL_COMMUNITY): Payer: BC Managed Care – PPO

## 2021-03-02 ENCOUNTER — Other Ambulatory Visit: Payer: Self-pay

## 2021-03-02 VITALS — BP 137/82 | HR 91 | Temp 98.9°F | Resp 18

## 2021-03-02 DIAGNOSIS — Z5189 Encounter for other specified aftercare: Secondary | ICD-10-CM | POA: Diagnosis not present

## 2021-03-02 DIAGNOSIS — E119 Type 2 diabetes mellitus without complications: Secondary | ICD-10-CM | POA: Diagnosis not present

## 2021-03-02 DIAGNOSIS — C250 Malignant neoplasm of head of pancreas: Secondary | ICD-10-CM | POA: Diagnosis not present

## 2021-03-02 DIAGNOSIS — R42 Dizziness and giddiness: Secondary | ICD-10-CM | POA: Diagnosis not present

## 2021-03-02 DIAGNOSIS — R200 Anesthesia of skin: Secondary | ICD-10-CM | POA: Diagnosis not present

## 2021-03-02 DIAGNOSIS — Z79899 Other long term (current) drug therapy: Secondary | ICD-10-CM | POA: Diagnosis not present

## 2021-03-02 DIAGNOSIS — Z23 Encounter for immunization: Secondary | ICD-10-CM | POA: Diagnosis not present

## 2021-03-02 DIAGNOSIS — I10 Essential (primary) hypertension: Secondary | ICD-10-CM | POA: Diagnosis not present

## 2021-03-02 DIAGNOSIS — F17210 Nicotine dependence, cigarettes, uncomplicated: Secondary | ICD-10-CM | POA: Diagnosis not present

## 2021-03-02 DIAGNOSIS — R634 Abnormal weight loss: Secondary | ICD-10-CM | POA: Diagnosis not present

## 2021-03-02 DIAGNOSIS — R109 Unspecified abdominal pain: Secondary | ICD-10-CM | POA: Diagnosis not present

## 2021-03-02 DIAGNOSIS — M5489 Other dorsalgia: Secondary | ICD-10-CM | POA: Diagnosis not present

## 2021-03-02 DIAGNOSIS — C259 Malignant neoplasm of pancreas, unspecified: Secondary | ICD-10-CM

## 2021-03-02 DIAGNOSIS — R5383 Other fatigue: Secondary | ICD-10-CM | POA: Diagnosis not present

## 2021-03-02 DIAGNOSIS — Z95828 Presence of other vascular implants and grafts: Secondary | ICD-10-CM

## 2021-03-02 DIAGNOSIS — Z8249 Family history of ischemic heart disease and other diseases of the circulatory system: Secondary | ICD-10-CM | POA: Diagnosis not present

## 2021-03-02 DIAGNOSIS — E785 Hyperlipidemia, unspecified: Secondary | ICD-10-CM | POA: Diagnosis not present

## 2021-03-02 DIAGNOSIS — Z5111 Encounter for antineoplastic chemotherapy: Secondary | ICD-10-CM | POA: Diagnosis not present

## 2021-03-02 MED ORDER — PEGFILGRASTIM-CBQV 6 MG/0.6ML ~~LOC~~ SOSY
6.0000 mg | PREFILLED_SYRINGE | Freq: Once | SUBCUTANEOUS | Status: AC
  Administered 2021-03-02: 6 mg via SUBCUTANEOUS
  Filled 2021-03-02: qty 0.6

## 2021-03-02 MED ORDER — HEPARIN SOD (PORK) LOCK FLUSH 100 UNIT/ML IV SOLN
500.0000 [IU] | Freq: Once | INTRAVENOUS | Status: AC | PRN
  Administered 2021-03-02: 500 [IU]

## 2021-03-02 MED ORDER — SODIUM CHLORIDE 0.9% FLUSH
10.0000 mL | INTRAVENOUS | Status: DC | PRN
  Administered 2021-03-02: 10 mL

## 2021-03-02 NOTE — Progress Notes (Signed)
Patient arrived at clinic today with ambulatory pump running with 5.54ml remaining.  He reports that he has had some cold sensitivity since getting treatment 2 days ago.  He also had some muscle spasms in his jaws while eating.  This has happened with each treatment but gets better as the days go by.  He denies any tongue swelling or feelings of his throat closing.  He was reminded that if he has any of those issues to please let us know as soon as possilbe.  He reports that overall he has done well with his treatments.  Per MD orders, his pump was stopped and port flushed and deaccessed.  He was given his udenyca injections per orders, see MAR for administration information.  He remained stable during the pump d/c and injection.  He was discharged ambulatory and in stable condition from clinic.  He has his follow up appointments in 2 weeks.

## 2021-03-07 ENCOUNTER — Other Ambulatory Visit (HOSPITAL_COMMUNITY): Payer: BC Managed Care – PPO

## 2021-03-07 ENCOUNTER — Ambulatory Visit (HOSPITAL_COMMUNITY): Payer: BC Managed Care – PPO

## 2021-03-07 ENCOUNTER — Ambulatory Visit (HOSPITAL_COMMUNITY): Payer: BC Managed Care – PPO | Admitting: Hematology

## 2021-03-09 ENCOUNTER — Encounter (HOSPITAL_COMMUNITY): Payer: BC Managed Care – PPO

## 2021-03-12 ENCOUNTER — Encounter (HOSPITAL_COMMUNITY): Payer: BC Managed Care – PPO | Admitting: Dietician

## 2021-03-12 ENCOUNTER — Telehealth (HOSPITAL_COMMUNITY): Payer: Self-pay | Admitting: Dietician

## 2021-03-12 DIAGNOSIS — C259 Malignant neoplasm of pancreas, unspecified: Secondary | ICD-10-CM | POA: Diagnosis not present

## 2021-03-12 NOTE — Telephone Encounter (Signed)
Nutrition Follow-up:  Patient receiving neoadjuvant FOLFIRINOX for pancreatic cancer.  Spoke with patient via telephone. He reports appetite is not great, but makes himself eat 3 meals/day. He started taking Megace BID last week. Patient reports cold sensitivity lasted ~7 days which increased from 2-3 days. He had to "lay off" Ensure during that time. Patient does not care for room temperature supplements. Patient reports ongoing altered taste, he reports finding banana/mayonnaise sandwiches as well as canned spaghetti and meatballs "tasty." This morning he had glass of chocolate milk, cantaloupe, and pop tart for breakfast. Patient was "on his own" last night for dinner, recalls corn dog, onion rings, 1/2 BBQ sandwich with slaw.Denies nausea, vomiting, constipation, diarrhea.     Medications: Megace  Labs: 10/12 - Glucose 157  Anthropometrics: Last weight 193 lb 4.8 oz on 10/12 increased from 191 lb 2.2 oz on 10/5  9/21 - 194 lb 8 oz 9/7 - 192 lb 12.8 oz    NUTRITION DIAGNOSIS: Unintentional weight loss stable   INTERVENTION:  Continue to encourage small frequent meals and snacks with adequate calories and protein - pt received handout in the mail Reviewed strategies for altered taste Continue drinking 2 Ensure Plus/equivalent daily - will mail coupons Suggested ways to consume Ensure when having cold sensitivity (adding to coffee as creamer, mixing with hot cocoa) Continue taking appetite stimulant per MD Patient has contact information     MONITORING, EVALUATION, GOAL: weight trends, intake    NEXT VISIT: via telephone ~ 6 weeks

## 2021-03-13 NOTE — Progress Notes (Signed)
[place], [place] [address]   CLINIC:  Medical Oncology/Hematology  PCP:  [name], MD [address] [place] / [place] [address] [phone]   REASON FOR VISIT:  Follow-up for pancreatic adenocarcinoma  PRIOR THERAPY: none  NGS Results: not done  CURRENT THERAPY: FOLFIRINOX every 2 weeks x 4 cycles  BRIEF ONCOLOGIC HISTORY:  Oncology History  Pancreatic adenocarcinoma ([facility])  12/04/2020 Initial Diagnosis   Pancreatic adenocarcinoma ([facility])   01/10/2021 -  Chemotherapy   Patient is on Treatment Plan : PANCREAS Modified FOLFIRINOX q14d x 4 cycles       CANCER STAGING: Cancer Staging Pancreatic adenocarcinoma ([place]) Staging form: Exocrine Pancreas, AJCC 8th Edition - Clinical stage from 12/04/2020: Stage IB (cT2, cN0, cM0) - Unsigned   INTERVAL HISTORY:  [name], a 65 y.o. male, returns for routine follow-up and consideration for next cycle of chemotherapy. Omega was last seen on 02/28/2021.  Due for cycle #5 of FOLFIRINOX today.   Overall, he tells me he has been feeling pretty well. He reports cold sensitivity for 1 week following treatment, and his appetite is poor. He denies abdominal pain, mouth sores, diarrhea, and tingling/numbness. His back pain is stable.   Overall, he feels ready for next cycle of chemo today.   REVIEW OF SYSTEMS:  Review of Systems  Constitutional:  Positive for appetite change (40%). Negative for fatigue (75%\).  HENT:   Negative for mouth sores.   Gastrointestinal:  Negative for abdominal pain and diarrhea.  Musculoskeletal:  Positive for back pain (stable).  Neurological:  Positive for dizziness and headaches. Negative for numbness.  All other systems reviewed and are negative.  PAST MEDICAL/SURGICAL HISTORY:  Past Medical History:  Diagnosis Date   Anemia, iron deficiency    Diabetes mellitus without complication ([place])    on meds   Hearing loss    Bil hearing aids    Hyperlipidemia    Hypertension    Port-A-Cath in place 12/26/2020   Prediabetes    Sleep apnea    Past Surgical History:  Procedure Laterality Date   BILIARY STENT PLACEMENT N/A 11/09/2020   Procedure: BILIARY STENT PLACEMENT - 27 F X 5 CM STENT;  Surgeon: Rogene Houston, MD;  Location: [facility];  Service: Endoscopy;  Laterality: N/A;   BILIARY STENT PLACEMENT N/A 12/27/2020   Procedure: BILIARY STENT PLACEMENT;  Surgeon: Rogene Houston, MD;  Location: [facility];  Service: Endoscopy;  Laterality: N/A;   COLONOSCOPY     ERCP N/A 11/09/2020   Procedure: ENDOSCOPIC RETROGRADE CHOLANGIOPANCREATOGRAPHY (ERCP);  Surgeon: Rogene Houston, MD;  Location: [facility];  Service: Endoscopy;  Laterality: N/A;   ERCP N/A 12/27/2020   Procedure: ENDOSCOPIC RETROGRADE CHOLANGIOPANCREATOGRAPHY (ERCP);  Surgeon: Rogene Houston, MD;  Location: [facility];  Service: Endoscopy;  Laterality: N/A;   ESOPHAGOGASTRODUODENOSCOPY (EGD) WITH PROPOFOL N/A 11/30/2020   Procedure: ESOPHAGOGASTRODUODENOSCOPY (EGD) WITH PROPOFOL;  Surgeon: Milus Banister, MD;  Location: [facility];  Service: Endoscopy;  Laterality: N/A;   EUS N/A 11/30/2020   Procedure: UPPER ENDOSCOPIC ULTRASOUND (EUS) RADIAL;  Surgeon: Milus Banister, MD;  Location: [facility];  Service: Endoscopy;  Laterality: N/A;   FINE NEEDLE ASPIRATION N/A 11/30/2020   Procedure: FINE NEEDLE ASPIRATION (FNA) LINEAR;  Surgeon: Milus Banister, MD;  Location[facility];  Service: Endoscopy;  Laterality: N/A;   GIVENS CAPSULE STUDY     IR IMAGING GUIDED PORT INSERTION  12/18/2020  KNEE SURGERY     4 surgeries on left, 2 on right knee   SHOULDER SURGERY     right shoulder   SPHINCTEROTOMY N/A 11/09/2020   Procedure: SHORT BILIARY SPHINCTEROTOMY;  Surgeon: Rogene Houston, MD;  Location: [facility];  Service: Endoscopy;  Laterality: N/A;    SOCIAL HISTORY:  Social History   Socioeconomic History   Marital status: Married    Spouse name: Not on file   Number of  children: Not on file   Years of education: Not on file   Highest education level: Not on file  Occupational History   Not on file  Tobacco Use   Smoking status: Every Day    Types: Cigars   Smokeless tobacco: Never   Tobacco comments:    Smoke 3-4 cigars a day  Vaping Use   Vaping Use: Never used  Substance and Sexual Activity   Alcohol use: Yes    Comment: occasional   Drug use: Never   Sexual activity: Not on file  Other Topics Concern   Not on file  Social History Narrative   Not on file   Social Determinants of Health   Financial Resource Strain: Low Risk    Difficulty of Paying Living Expenses: Not hard at all  Food Insecurity: No Food Insecurity   Worried About Charity fundraiser in the Last Year: Never true   [place] in the Last Year: Never true  Transportation Needs: No Transportation Needs   Lack of Transportation (Medical): No   Lack of Transportation (Non-Medical): No  Physical Activity: Sufficiently Active   Days of Exercise per Week: 5 days   Minutes of Exercise per Session: 30 min  Stress: Not on file  Social Connections: Not on file  Intimate Partner Violence: Not At Risk   Fear of Current or Ex-Partner: No   Emotionally Abused: No   Physically Abused: No   Sexually Abused: No    FAMILY HISTORY:  Family History  Problem Relation Age of Onset   Heart attack Father    Heart attack Paternal Grandfather    Leukemia Sister    Heart attack Brother     CURRENT MEDICATIONS:  Current Outpatient Medications  Medication Sig Dispense Refill   Continuous Blood Gluc Receiver (FREESTYLE LIBRE 2 READER) DEVI As directed 1 each 0   Continuous Blood Gluc Sensor (FREESTYLE LIBRE 2 SENSOR) MISC 1 Piece by Does not apply route every 14 (fourteen) days. 6 each 2   fluorouracil CALGB 99371 2,400 mg/m2 in sodium chloride 0.9 % 150 mL Inject 2,400 mg/m2 into the vein over 48 hr. Every 14 days     Glucosamine-Chondroitin (OSTEO BI-FLEX REGULAR STRENGTH PO)  Take 2 tablets by mouth daily.     insulin detemir (LEVEMIR) 100 UNIT/ML FlexPen Inject 35 Units into the skin at bedtime. 45 mL 0   insulin lispro (HUMALOG KWIKPEN) 100 UNIT/ML KwikPen Inject 10-16 Units into the skin 3 (three) times daily before meals. 45 mL 1   IRINOTECAN HCL IV Inject 150 mg/m2 into the vein every 14 (fourteen) days.     IRON-VITAMIN C PO Take 1 tablet by mouth daily.     LEUCOVORIN CALCIUM IV Inject 400 mg/m2 into the vein every 14 (fourteen) days.     levothyroxine (SYNTHROID) 175 MCG tablet TAKE 1 TABLET DAILY (NEEDS VISIT IN [place]) 90 tablet 1   lidocaine-prilocaine (EMLA) cream Apply a small amount to port a cath site (do not rub in) and  cover with plastic wrap 1 hour prior to infusion appointments 30 g 3   megestrol (MEGACE) 400 MG/10ML suspension Take 10 mLs (400 mg total) by mouth 2 (two) times daily. 240 mL 0   Melatonin-Pyridoxine (MELATIN PO) Take by mouth. Takes 2 qhs     ondansetron (ZOFRAN) 8 MG tablet Take 1 tablet (8 mg total) by mouth 2 (two) times daily as needed. Start on day 3 after chemotherapy. 30 tablet 1   OXALIPLATIN IV Inject 85 mg/m2 into the vein every 14 (fourteen) days.     oxyCODONE-acetaminophen (PERCOCET/ROXICET) 5-325 MG tablet Take 1 tablet by mouth every 6 (six) hours as needed for severe pain.     prochlorperazine (COMPAZINE) 10 MG tablet Take 10 mg by mouth every 6 (six) hours as needed for nausea or vomiting.     No current facility-administered medications for this visit.   Facility-Administered Medications Ordered in Other Visits  Medication Dose Route Frequency Provider Last Rate Last Admin   alteplase (CATHFLO ACTIVASE) injection 2 mg  2 mg Intracatheter Once PRN Derek Jack, MD       heparin lock flush 100 unit/mL  250 Units Intracatheter Once PRN Derek Jack, MD       sodium chloride flush (NS) 0.9 % injection 10 mL  10 mL Intracatheter PRN Derek Jack, MD   10 mL at 01/26/21 1314   sodium chloride  flush (NS) 0.9 % injection 3 mL  3 mL Intracatheter PRN Derek Jack, MD        ALLERGIES:  Allergies  Allergen Reactions   Lisinopril Cough    PHYSICAL EXAM:  Performance status (ECOG): 1 - Symptomatic but completely ambulatory  There were no vitals filed for this visit. Wt Readings from Last 3 Encounters:  02/28/21 193 lb 4.8 oz (87.7 kg)  02/21/21 191 lb 2.2 oz (86.7 kg)  02/07/21 194 lb 8 oz (88.2 kg)   Physical Exam Vitals reviewed.  Constitutional:      Appearance: Normal appearance.  Cardiovascular:     Rate and Rhythm: Normal rate and regular rhythm.     Pulses: Normal pulses.     Heart sounds: Normal heart sounds.  Pulmonary:     Effort: Pulmonary effort is normal.     Breath sounds: Normal breath sounds.  Neurological:     General: No focal deficit present.     Mental Status: He is alert and oriented to person, place, and time.  Psychiatric:        Mood and Affect: Mood normal.        Behavior: Behavior normal.    LABORATORY DATA:  I have reviewed the labs as listed.  CBC Latest Ref Rng & Units 02/28/2021 02/21/2021 02/07/2021  WBC 4.0 - 10.5 K/uL 12.4(H) 14.8(H) 14.3(H)  Hemoglobin 13.0 - 17.0 g/dL 12.3(L) 12.6(L) 12.0(L)  Hematocrit 39.0 - 52.0 % 37.3(L) 38.6(L) 37.1(L)  Platelets 150 - 400 K/uL 377 244 334   CMP Latest Ref Rng & Units 02/28/2021 02/21/2021 02/07/2021  Glucose 70 - 99 mg/dL 157(H) 174(H) 203(H)  BUN 8 - 23 mg/dL $Remove'14 18 15  'OKyqrfe$ Creatinine 0.61 - 1.24 mg/dL 0.77 0.88 0.92  Sodium 135 - 145 mmol/L 136 133(L) 134(L)  Potassium 3.5 - 5.1 mmol/L 3.9 4.2 4.2  Chloride 98 - 111 mmol/L 106 104 104  CO2 22 - 32 mmol/L 23 21(L) 24  Calcium 8.9 - 10.3 mg/dL 7.8(L) 7.9(L) 8.1(L)  Total Protein 6.5 - 8.1 g/dL 5.9(L) 6.3(L) 6.4(L)  Total Bilirubin 0.3 -  1.2 mg/dL 0.5 0.7 0.3  Alkaline Phos 38 - 126 U/L 164(H) 161(H) 146(H)  AST 15 - 41 U/L 22 17 14(L)  ALT 0 - 44 U/L $Remo'21 14 13    'jYbfq$ DIAGNOSTIC IMAGING:  I have independently reviewed the scans and  discussed with the patient. No results found.   ASSESSMENT:  1.  Stage I (T2N0) pancreatic adenocarcinoma: - Recent admission to the hospital with DKA, found to have elevated liver enzymes. - MRCP without contrast on 11/08/2020 showed 2.3 x 2 cm central pancreatic head and uncinate mass with intra and extrahepatic biliary ductal dilation. - 11/09/2020-ERCP sphincterotomy and stenting with findings showing short high-grade distal CBD stricture with dilated CBD up to 14 mm.  Bile duct brushing was negative for malignant cells. - 11/10/2020-CA 19.9-367. - ERCP/EUS by [name] on 11/30/2020 with 2.9 cm mass in the head of the pancreas with unclear outer borders.  Given significant dilatation, indistinct borders, unable to confidently assess major vascular involvement.  Abutment and probable invasion of SMV and portal vein near the confluence with splenic vein.  No obvious SMA or celiac artery involvement.  No other adenopathy. - FNA of the pancreatic mass consistent with adenocarcinoma. - Weight loss intentional 25 pounds between January and April 1.  Additional 25 pound weight loss since 1 April, unintentional. - CT [facility] pancreatic protocol on 12/05/2020 with ill-defined mass in the pancreatic head and uncinate process measuring up to 1.9 x 2.1 cm with associated pancreatic ductal dilatation up to 8 mm.  No enlarged adenopathy.  Teardrop morphology of SMV indicative of SMV involvement.  Replaced right hepatic arterial supply arises from the SMA passing immediately posterior to the tumor. - PET scan on 12/14/2020 with mass in the pancreatic head with hypermetabolic features, SUV 5.3 with no sign of metastatic disease. - Neoadjuvant FOLFIRINOX started on 01/10/2021.  2.  Social/family history: - He lives at home with his wife and is independent of all ADLs and IADLs. - He currently works in [employer].  No history of chemical exposure. - He previously smoked cigarettes.  Currently smokes cigars 4/day. - Maternal  grandmother died at age 61 with malignancy, unknown type. - Sister died of acute leukemia.   PLAN:  1.  Stage I (T2N0) pancreatic adenocarcinoma: - He had mild nausea without any vomiting.  Mild diarrhea was also fairly well controlled. - We reviewed labs today which showed elevated alk phos 194.  Albumin is 2.9.  Rest of the comprehensive metabolic panel is within normal limits.  CBC was adequate.  Last CA 19-9 was 396.  We have sent a tumor marker which is pending today. - Proceed with cycle 5 of FOLFIRINOX today.  We will plan for cycle 6 on 03/28/2021 followed by scans (CT [facility] pancreatic protocol and CT chest with contrast) on 04/09/2021. - We will contact Dr. Barry Dienes and let her know about the scans. - We have made a request for geneticist consultation for germline mutation testing.  2.  Weight loss: - He lost about 3 pounds in the last 2 weeks. - He took couple of doses of Megace and stopped taking it. - I have encouraged him to restart Megace.  3.  Diabetes: - Continue Humalog 3 times daily with meals and Levemir at bedtime.  4.  Paraspinal back pain: - Continue Percocet as needed.   Orders placed this encounter:  No orders of the defined types were placed in this encounter.    Derek Jack, MD [facility]  [phone]   I, Thana Ates, am acting as a scribe for Dr. Derek Jack.  I, Derek Jack MD, have reviewed the above documentation for accuracy and completeness, and I agree with the above.

## 2021-03-14 ENCOUNTER — Encounter (HOSPITAL_COMMUNITY): Payer: BC Managed Care – PPO

## 2021-03-14 ENCOUNTER — Other Ambulatory Visit: Payer: Self-pay

## 2021-03-14 ENCOUNTER — Inpatient Hospital Stay (HOSPITAL_COMMUNITY): Payer: BC Managed Care – PPO

## 2021-03-14 ENCOUNTER — Inpatient Hospital Stay (HOSPITAL_BASED_OUTPATIENT_CLINIC_OR_DEPARTMENT_OTHER): Payer: BC Managed Care – PPO | Admitting: Hematology

## 2021-03-14 VITALS — BP 125/74 | HR 89 | Temp 98.5°F | Resp 18

## 2021-03-14 VITALS — BP 114/69 | HR 94 | Temp 96.8°F | Resp 18 | Wt 189.0 lb

## 2021-03-14 DIAGNOSIS — R42 Dizziness and giddiness: Secondary | ICD-10-CM | POA: Diagnosis not present

## 2021-03-14 DIAGNOSIS — Z95828 Presence of other vascular implants and grafts: Secondary | ICD-10-CM

## 2021-03-14 DIAGNOSIS — C259 Malignant neoplasm of pancreas, unspecified: Secondary | ICD-10-CM | POA: Diagnosis not present

## 2021-03-14 DIAGNOSIS — Z5111 Encounter for antineoplastic chemotherapy: Secondary | ICD-10-CM | POA: Diagnosis not present

## 2021-03-14 DIAGNOSIS — Z23 Encounter for immunization: Secondary | ICD-10-CM | POA: Diagnosis not present

## 2021-03-14 DIAGNOSIS — Z79899 Other long term (current) drug therapy: Secondary | ICD-10-CM | POA: Diagnosis not present

## 2021-03-14 DIAGNOSIS — E119 Type 2 diabetes mellitus without complications: Secondary | ICD-10-CM | POA: Diagnosis not present

## 2021-03-14 DIAGNOSIS — R5383 Other fatigue: Secondary | ICD-10-CM | POA: Diagnosis not present

## 2021-03-14 DIAGNOSIS — M5489 Other dorsalgia: Secondary | ICD-10-CM | POA: Diagnosis not present

## 2021-03-14 DIAGNOSIS — Z5189 Encounter for other specified aftercare: Secondary | ICD-10-CM | POA: Diagnosis not present

## 2021-03-14 DIAGNOSIS — R109 Unspecified abdominal pain: Secondary | ICD-10-CM | POA: Diagnosis not present

## 2021-03-14 DIAGNOSIS — R200 Anesthesia of skin: Secondary | ICD-10-CM | POA: Diagnosis not present

## 2021-03-14 DIAGNOSIS — R634 Abnormal weight loss: Secondary | ICD-10-CM | POA: Diagnosis not present

## 2021-03-14 DIAGNOSIS — E785 Hyperlipidemia, unspecified: Secondary | ICD-10-CM | POA: Diagnosis not present

## 2021-03-14 DIAGNOSIS — F17210 Nicotine dependence, cigarettes, uncomplicated: Secondary | ICD-10-CM | POA: Diagnosis not present

## 2021-03-14 DIAGNOSIS — Z8249 Family history of ischemic heart disease and other diseases of the circulatory system: Secondary | ICD-10-CM | POA: Diagnosis not present

## 2021-03-14 DIAGNOSIS — I10 Essential (primary) hypertension: Secondary | ICD-10-CM | POA: Diagnosis not present

## 2021-03-14 DIAGNOSIS — C250 Malignant neoplasm of head of pancreas: Secondary | ICD-10-CM | POA: Diagnosis not present

## 2021-03-14 LAB — COMPREHENSIVE METABOLIC PANEL
ALT: 27 U/L (ref 0–44)
AST: 24 U/L (ref 15–41)
Albumin: 2.9 g/dL — ABNORMAL LOW (ref 3.5–5.0)
Alkaline Phosphatase: 194 U/L — ABNORMAL HIGH (ref 38–126)
Anion gap: 7 (ref 5–15)
BUN: 11 mg/dL (ref 8–23)
CO2: 21 mmol/L — ABNORMAL LOW (ref 22–32)
Calcium: 8.2 mg/dL — ABNORMAL LOW (ref 8.9–10.3)
Chloride: 107 mmol/L (ref 98–111)
Creatinine, Ser: 0.81 mg/dL (ref 0.61–1.24)
GFR, Estimated: >60 mL/min (ref >60)
Glucose, Bld: 172 mg/dL — ABNORMAL HIGH (ref 70–99)
Potassium: 3.8 mmol/L (ref 3.5–5.1)
Sodium: 135 mmol/L (ref 135–145)
Total Bilirubin: 0.4 mg/dL (ref 0.3–1.2)
Total Protein: 6.5 g/dL (ref 6.5–8.1)

## 2021-03-14 LAB — CBC WITH DIFFERENTIAL/PLATELET
Abs Immature Granulocytes: 0.31 10*3/uL — ABNORMAL HIGH (ref 0.00–0.07)
Basophils Absolute: 0.1 10*3/uL (ref 0.0–0.1)
Basophils Relative: 1 %
Eosinophils Absolute: 0.2 10*3/uL (ref 0.0–0.5)
Eosinophils Relative: 1 %
HCT: 37.2 % — ABNORMAL LOW (ref 39.0–52.0)
Hemoglobin: 11.8 g/dL — ABNORMAL LOW (ref 13.0–17.0)
Immature Granulocytes: 2 %
Lymphocytes Relative: 12 %
Lymphs Abs: 1.8 10*3/uL (ref 0.7–4.0)
MCH: 29.9 pg (ref 26.0–34.0)
MCHC: 31.7 g/dL (ref 30.0–36.0)
MCV: 94.2 fL (ref 80.0–100.0)
Monocytes Absolute: 1.1 10*3/uL — ABNORMAL HIGH (ref 0.1–1.0)
Monocytes Relative: 7 %
Neutro Abs: 11.1 10*3/uL — ABNORMAL HIGH (ref 1.7–7.7)
Neutrophils Relative %: 77 %
Platelets: 409 10*3/uL — ABNORMAL HIGH (ref 150–400)
RBC: 3.95 MIL/uL — ABNORMAL LOW (ref 4.22–5.81)
RDW: 18.2 % — ABNORMAL HIGH (ref 11.5–15.5)
WBC: 14.6 10*3/uL — ABNORMAL HIGH (ref 4.0–10.5)
nRBC: 0 % (ref 0.0–0.2)

## 2021-03-14 LAB — MAGNESIUM: Magnesium: 1.8 mg/dL (ref 1.7–2.4)

## 2021-03-14 MED ORDER — DEXTROSE 5 % IV SOLN: Freq: Once | INTRAVENOUS | Status: DC

## 2021-03-14 MED ORDER — SODIUM CHLORIDE 0.9 % IV SOLN
Freq: Once | INTRAVENOUS | Status: AC
Start: 2021-03-14 — End: 2021-03-14

## 2021-03-14 MED ORDER — SODIUM CHLORIDE 0.9 % IV SOLN
150.0000 mg | Freq: Once | INTRAVENOUS | Status: AC
  Administered 2021-03-14: 150 mg via INTRAVENOUS
  Filled 2021-03-14: qty 150

## 2021-03-14 MED ORDER — SODIUM CHLORIDE 0.9 % IV SOLN: 2400.0000 mg/m2 | INTRAVENOUS | Status: DC

## 2021-03-14 MED ORDER — SODIUM CHLORIDE 0.9% FLUSH: 10.0000 mL | INTRAVENOUS | Status: DC | PRN

## 2021-03-14 MED ORDER — SODIUM CHLORIDE 0.9 % IV SOLN
10.0000 mg | Freq: Once | INTRAVENOUS | Status: AC
  Administered 2021-03-14: 10 mg via INTRAVENOUS
  Filled 2021-03-14: qty 10

## 2021-03-14 MED ORDER — SODIUM CHLORIDE 0.9 % IV SOLN: Freq: Once | INTRAVENOUS | Status: AC

## 2021-03-14 MED ORDER — SODIUM CHLORIDE 0.9 % IV SOLN
150.0000 mg/m2 | Freq: Once | INTRAVENOUS | Status: AC
  Administered 2021-03-14: 320 mg via INTRAVENOUS
  Filled 2021-03-14: qty 15

## 2021-03-14 MED ORDER — PALONOSETRON HCL INJECTION 0.25 MG/5ML
0.2500 mg | Freq: Once | INTRAVENOUS | Status: AC
  Administered 2021-03-14: 0.25 mg via INTRAVENOUS
  Filled 2021-03-14: qty 5

## 2021-03-14 MED ORDER — ATROPINE SULFATE 1 MG/ML IV SOLN
0.5000 mg | Freq: Once | INTRAVENOUS | Status: AC | PRN
  Administered 2021-03-14: 0.5 mg via INTRAVENOUS
  Filled 2021-03-14: qty 1

## 2021-03-14 MED ORDER — OXALIPLATIN CHEMO INJECTION 100 MG/20ML
85.0000 mg/m2 | Freq: Once | INTRAVENOUS | Status: AC
  Administered 2021-03-14: 180 mg via INTRAVENOUS
  Filled 2021-03-14: qty 36

## 2021-03-14 MED ORDER — HEPARIN SOD (PORK) LOCK FLUSH 100 UNIT/ML IV SOLN: 500.0000 [IU] | Freq: Once | INTRAVENOUS | Status: DC | PRN

## 2021-03-14 MED ORDER — DEXTROSE 5 % IV SOLN
Freq: Once | INTRAVENOUS | Status: AC
Start: 2021-03-14 — End: 2021-03-14

## 2021-03-14 MED ORDER — SODIUM CHLORIDE 0.9 % IV SOLN
400.0000 mg/m2 | Freq: Once | INTRAVENOUS | Status: AC
  Administered 2021-03-14: 852 mg via INTRAVENOUS
  Filled 2021-03-14: qty 42.6

## 2021-03-14 MED ORDER — SODIUM CHLORIDE 0.9 % IV SOLN
2350.0000 mg/m2 | INTRAVENOUS | Status: DC
  Administered 2021-03-14: 5000 mg via INTRAVENOUS
  Filled 2021-03-14: qty 100

## 2021-03-14 NOTE — Progress Notes (Signed)
Patient has been examined, vital signs and labs have been reviewed by Dr. Katragadda. ANC, Creatinine, LFTs, hemoglobin, and platelets are within treatment parameters per Dr. Katragadda. Patient may proceed with treatment per M.D.   

## 2021-03-14 NOTE — Progress Notes (Signed)
Pt presents today for Folfirinox per provider's order. Vital signs stable and pt voiced no new complaints at this time.  Folfirinox given today per MD orders. Tolerated infusion without adverse affects. Vital signs stable. No complaints at this time. Discharged from clinic ambulatory in stable condition. Alert and oriented x 3. F/U with [facility] as scheduled. 5FU ambulatory pump infusing.

## 2021-03-14 NOTE — Progress Notes (Signed)
Patients port flushed without difficulty.  Good blood return noted with no bruising or swelling noted at site.  Stable during access and blood draw.  Patient to remain accessed for treatment. 

## 2021-03-14 NOTE — Patient Instructions (Addendum)
[place] at [facility] Discharge Instructions  You were seen and examined by [name]. We will proceed with cycle 5 of treatment today. Return as scheduled for cycle 6. We will arrange a CT scan after cycle 6. Schedule appointment with Dr. Barry Dienes as discussed.    Thank you for choosing [place] at [facility] to provide your oncology and hematology care.  To afford each patient quality time with our provider, please arrive at least 15 minutes before your scheduled appointment time.   If you have a lab appointment with the [facility] please come in thru the Main Entrance and check in at the main information desk.  You need to re-schedule your appointment should you arrive 10 or more minutes late.  We strive to give you quality time with our providers, and arriving late affects you and other patients whose appointments are after yours.  Also, if you no show three or more times for appointments you may be dismissed from the clinic at the providers discretion.     Again, thank you for choosing [facility].  Our hope is that these requests will decrease the amount of time that you wait before being seen by our physicians.       _____________________________________________________________  Should you have questions after your visit to [facility], please contact our office at [phone] and follow the prompts.  Our office hours are 8:00 a.m. and 4:30 p.m. Monday - Friday.  Please note that voicemails left after 4:00 p.m. may not be returned until the following business day.  We are closed weekends and major holidays.  You do have access to a nurse 24-7, just call the main number to the clinic [phone] and do not press any options, hold on the line and a nurse will answer the phone.    For prescription refill requests, have your pharmacy contact our office and allow 72 hours.    Due to Covid, you will need  to wear a mask upon entering the hospital. If you do not have a mask, a mask will be given to you at the Main Entrance upon arrival. For doctor visits, patients may have 1 support person age 75 or older with them. For treatment visits, patients can not have anyone with them due to social distancing guidelines and our immunocompromised population.

## 2021-03-14 NOTE — Addendum Note (Signed)
Addended by: Joie Bimler on: 03/14/2021 02:14 PM   Modules accepted: Orders

## 2021-03-14 NOTE — Patient Instructions (Addendum)
[place]  Discharge Instructions: Thank you for choosing [facility] to provide your oncology and hematology care.  If you have a lab appointment with the [facility], please come in thru the Main Entrance and check in at the main information desk.  Wear comfortable clothing and clothing appropriate for easy access to any Portacath or PICC line.   We strive to give you quality time with your provider. You may need to reschedule your appointment if you arrive late (15 or more minutes).  Arriving late affects you and other patients whose appointments are after yours.  Also, if you miss three or more appointments without notifying the office, you may be dismissed from the clinic at the provider's discretion.      For prescription refill requests, have your pharmacy contact our office and allow 72 hours for refills to be completed.    Today you received the following chemotherapy and/or immunotherapy agents Folfirinox   To help prevent nausea and vomiting after your treatment, we encourage you to take your nausea medication as directed.  The chemotherapy medication bag should finish at 46 hours, 96 hours, or 7 days. For example, if your pump is scheduled for 46 hours and it was put on at 4:00 p.m., it should finish at 2:00 p.m. the day it is scheduled to come off regardless of your appointment time.     Estimated time to finish at 2pm.   If the display on your pump reads "Low Volume" and it is beeping, take the batteries out of the pump and come to the cancer center for it to be taken off.   If the pump alarms go off prior to the pump reading "Low Volume" then call [phone] and someone can assist you.  If the plunger comes out and the chemotherapy medication is leaking out, please use your home chemo spill kit to clean up the spill. Do NOT use paper towels or other household products.  If you have problems or questions regarding your pump, please call either  [phone] (24 hours a day) or the cancer center Monday-Friday 8:00 a.m.- 4:30 p.m. at the clinic number and we will assist you. If you are unable to get assistance, then go to the nearest Emergency Department and ask the staff to contact the IV team for assistance.    BELOW ARE SYMPTOMS THAT SHOULD BE REPORTED IMMEDIATELY: *FEVER GREATER THAN 100.4 F (38 C) OR HIGHER *CHILLS OR SWEATING *NAUSEA AND VOMITING THAT IS NOT CONTROLLED WITH YOUR NAUSEA MEDICATION *UNUSUAL SHORTNESS OF BREATH *UNUSUAL BRUISING OR BLEEDING *URINARY PROBLEMS (pain or burning when urinating, or frequent urination) *BOWEL PROBLEMS (unusual diarrhea, constipation, pain near the anus) TENDERNESS IN MOUTH AND THROAT WITH OR WITHOUT PRESENCE OF ULCERS (sore throat, sores in mouth, or a toothache) UNUSUAL RASH, SWELLING OR PAIN  UNUSUAL VAGINAL DISCHARGE OR ITCHING   Items with * indicate a potential emergency and should be followed up as soon as possible or go to the Emergency Department if any problems should occur.  Please show the CHEMOTHERAPY ALERT CARD or IMMUNOTHERAPY ALERT CARD at check-in to the Emergency Department and triage nurse.  Should you have questions after your visit or need to cancel or reschedule your appointment, please contact [facility] [phone]  and follow the prompts.  Office hours are 8:00 a.m. to 4:30 p.m. Monday - Friday. Please note that voicemails left after 4:00 p.m. may not be returned until the following business day.  We  are closed weekends and major holidays. You have access to a nurse at all times for urgent questions. Please call the main number to the clinic [phone] and follow the prompts.  For any non-urgent questions, you may also contact your provider using MyChart. We now offer e-Visits for anyone 63 and older to request care online for non-urgent symptoms. For details visit [email or web address].   Also download the MyChart app! Go to the app store,  search "MyChart", open the app, select [facility], and log in with your MyChart username and password.  Due to Covid, a mask is required upon entering the hospital/clinic. If you do not have a mask, one will be given to you upon arrival. For doctor visits, patients may have 1 support person aged 63 or older with them. For treatment visits, patients cannot have anyone with them due to current Covid guidelines and our immunocompromised population.

## 2021-03-14 NOTE — Progress Notes (Signed)
Good for treatment per Dr Raliegh Ip.

## 2021-03-15 LAB — CANCER ANTIGEN 19-9: CA 19-9: 996 U/mL — ABNORMAL HIGH (ref 0–35)

## 2021-03-16 ENCOUNTER — Inpatient Hospital Stay (HOSPITAL_COMMUNITY): Payer: BC Managed Care – PPO

## 2021-03-16 ENCOUNTER — Other Ambulatory Visit: Payer: Self-pay

## 2021-03-16 VITALS — BP 132/72 | HR 97 | Temp 96.9°F | Resp 18

## 2021-03-16 DIAGNOSIS — R634 Abnormal weight loss: Secondary | ICD-10-CM | POA: Diagnosis not present

## 2021-03-16 DIAGNOSIS — F17210 Nicotine dependence, cigarettes, uncomplicated: Secondary | ICD-10-CM | POA: Diagnosis not present

## 2021-03-16 DIAGNOSIS — R200 Anesthesia of skin: Secondary | ICD-10-CM | POA: Diagnosis not present

## 2021-03-16 DIAGNOSIS — Z79899 Other long term (current) drug therapy: Secondary | ICD-10-CM | POA: Diagnosis not present

## 2021-03-16 DIAGNOSIS — E119 Type 2 diabetes mellitus without complications: Secondary | ICD-10-CM | POA: Diagnosis not present

## 2021-03-16 DIAGNOSIS — I10 Essential (primary) hypertension: Secondary | ICD-10-CM | POA: Diagnosis not present

## 2021-03-16 DIAGNOSIS — Z8249 Family history of ischemic heart disease and other diseases of the circulatory system: Secondary | ICD-10-CM | POA: Diagnosis not present

## 2021-03-16 DIAGNOSIS — Z5189 Encounter for other specified aftercare: Secondary | ICD-10-CM | POA: Diagnosis not present

## 2021-03-16 DIAGNOSIS — Z95828 Presence of other vascular implants and grafts: Secondary | ICD-10-CM

## 2021-03-16 DIAGNOSIS — R109 Unspecified abdominal pain: Secondary | ICD-10-CM | POA: Diagnosis not present

## 2021-03-16 DIAGNOSIS — E785 Hyperlipidemia, unspecified: Secondary | ICD-10-CM | POA: Diagnosis not present

## 2021-03-16 DIAGNOSIS — C250 Malignant neoplasm of head of pancreas: Secondary | ICD-10-CM | POA: Diagnosis not present

## 2021-03-16 DIAGNOSIS — R42 Dizziness and giddiness: Secondary | ICD-10-CM | POA: Diagnosis not present

## 2021-03-16 DIAGNOSIS — M5489 Other dorsalgia: Secondary | ICD-10-CM | POA: Diagnosis not present

## 2021-03-16 DIAGNOSIS — C259 Malignant neoplasm of pancreas, unspecified: Secondary | ICD-10-CM

## 2021-03-16 DIAGNOSIS — Z5111 Encounter for antineoplastic chemotherapy: Secondary | ICD-10-CM | POA: Diagnosis not present

## 2021-03-16 DIAGNOSIS — R5383 Other fatigue: Secondary | ICD-10-CM | POA: Diagnosis not present

## 2021-03-16 DIAGNOSIS — Z23 Encounter for immunization: Secondary | ICD-10-CM | POA: Diagnosis not present

## 2021-03-16 MED ORDER — PEGFILGRASTIM-CBQV 6 MG/0.6ML ~~LOC~~ SOSY
6.0000 mg | PREFILLED_SYRINGE | Freq: Once | SUBCUTANEOUS | Status: AC
  Administered 2021-03-16: 6 mg via SUBCUTANEOUS
  Filled 2021-03-16: qty 0.6

## 2021-03-16 MED ORDER — HEPARIN SOD (PORK) LOCK FLUSH 100 UNIT/ML IV SOLN
500.0000 [IU] | Freq: Once | INTRAVENOUS | Status: AC | PRN
  Administered 2021-03-16: 500 [IU]

## 2021-03-16 MED ORDER — SODIUM CHLORIDE 0.9% FLUSH
10.0000 mL | INTRAVENOUS | Status: DC | PRN
Start: 2021-03-16 — End: 2021-03-16
  Administered 2021-03-16: 10 mL

## 2021-03-16 NOTE — Progress Notes (Signed)
Alexander Banks presents to have home infusion pump d/c'd and for port-a-cath deaccess with flush.  Portacath located R chest wall accessed with  H 20 needle.  Good blood return present. Portacath flushed with NS and 500U/63ml Heparin, and needle removed intact.  Procedure tolerated well and without incident.    After obtaining consent, and per orders of [name], injection of Udenyca given by Deztinee Lohmeyer, Beckie Salts. Patient tolerated injection well.  Patient left ambulatory in stable condition.

## 2021-03-16 NOTE — Patient Instructions (Signed)
[facility]  Discharge Instructions: Thank you for choosing [facility] to provide your oncology and hematology care.  If you have a lab appointment with the Cancer Center, please come in thru the Main Entrance and check in at the main information desk.  Wear comfortable clothing and clothing appropriate for easy access to any Portacath or PICC line.   We strive to give you quality time with your provider. You may need to reschedule your appointment if you arrive late (15 or more minutes).  Arriving late affects you and other patients whose appointments are after yours.  Also, if you miss three or more appointments without notifying the office, you may be dismissed from the clinic at the provider's discretion.      For prescription refill requests, have your pharmacy contact our office and allow 72 hours for refills to be completed.        To help prevent nausea and vomiting after your treatment, we encourage you to take your nausea medication as directed.  BELOW ARE SYMPTOMS THAT SHOULD BE REPORTED IMMEDIATELY: *FEVER GREATER THAN 100.4 F (38 C) OR HIGHER *CHILLS OR SWEATING *NAUSEA AND VOMITING THAT IS NOT CONTROLLED WITH YOUR NAUSEA MEDICATION *UNUSUAL SHORTNESS OF BREATH *UNUSUAL BRUISING OR BLEEDING *URINARY PROBLEMS (pain or burning when urinating, or frequent urination) *BOWEL PROBLEMS (unusual diarrhea, constipation, pain near the anus) TENDERNESS IN MOUTH AND THROAT WITH OR WITHOUT PRESENCE OF ULCERS (sore throat, sores in mouth, or a toothache) UNUSUAL RASH, SWELLING OR PAIN  UNUSUAL VAGINAL DISCHARGE OR ITCHING   Items with * indicate a potential emergency and should be followed up as soon as possible or go to the Emergency Department if any problems should occur.  Please show the CHEMOTHERAPY ALERT CARD or IMMUNOTHERAPY ALERT CARD at check-in to the Emergency Department and triage nurse.  Should you have questions after your visit or need to cancel  or reschedule your appointment, please contact [facility] [phone]  and follow the prompts.  Office hours are 8:00 a.m. to 4:30 p.m. Monday - Friday. Please note that voicemails left after 4:00 p.m. may not be returned until the following business day.  We are closed weekends and major holidays. You have access to a nurse at all times for urgent questions. Please call the main number to the clinic [phone] and follow the prompts.  For any non-urgent questions, you may also contact your provider using MyChart. We now offer e-Visits for anyone 18 and older to request care online for non-urgent symptoms. For details visit [email or web address].   Also download the MyChart app! Go to the app store, search "MyChart", open the app, select [facility], and log in with your MyChart username and password.  Due to Covid, a mask is required upon entering the hospital/clinic. If you do not have a mask, one will be given to you upon arrival. For doctor visits, patients may have 1 support person aged 18 or older with them. For treatment visits, patients cannot have anyone with them due to current Covid guidelines and our immunocompromised population.  

## 2021-03-21 ENCOUNTER — Ambulatory Visit (HOSPITAL_COMMUNITY): Payer: BC Managed Care – PPO | Admitting: Hematology

## 2021-03-21 ENCOUNTER — Ambulatory Visit (HOSPITAL_COMMUNITY): Payer: BC Managed Care – PPO

## 2021-03-21 ENCOUNTER — Other Ambulatory Visit (HOSPITAL_COMMUNITY): Payer: BC Managed Care – PPO

## 2021-03-23 ENCOUNTER — Encounter (HOSPITAL_COMMUNITY): Payer: BC Managed Care – PPO

## 2021-03-26 ENCOUNTER — Ambulatory Visit (HOSPITAL_BASED_OUTPATIENT_CLINIC_OR_DEPARTMENT_OTHER)
Admission: RE | Admit: 2021-03-26 | Discharge: 2021-03-26 | Disposition: A | Discharge status: Home / Self Care | Payer: BC Managed Care – PPO | Source: Ambulatory Visit | Attending: Hematology | Admitting: Hematology

## 2021-03-26 ENCOUNTER — Other Ambulatory Visit (HOSPITAL_BASED_OUTPATIENT_CLINIC_OR_DEPARTMENT_OTHER): Payer: Self-pay | Admitting: Hematology

## 2021-03-26 ENCOUNTER — Encounter (HOSPITAL_BASED_OUTPATIENT_CLINIC_OR_DEPARTMENT_OTHER): Payer: Self-pay

## 2021-03-26 ENCOUNTER — Other Ambulatory Visit: Payer: Self-pay

## 2021-03-26 DIAGNOSIS — C259 Malignant neoplasm of pancreas, unspecified: Secondary | ICD-10-CM | POA: Insufficient documentation

## 2021-03-26 DIAGNOSIS — N281 Cyst of kidney, acquired: Secondary | ICD-10-CM | POA: Diagnosis not present

## 2021-03-26 DIAGNOSIS — R911 Solitary pulmonary nodule: Secondary | ICD-10-CM | POA: Diagnosis not present

## 2021-03-26 DIAGNOSIS — K449 Diaphragmatic hernia without obstruction or gangrene: Secondary | ICD-10-CM | POA: Diagnosis not present

## 2021-03-26 DIAGNOSIS — I700 Atherosclerosis of aorta: Secondary | ICD-10-CM | POA: Diagnosis not present

## 2021-03-26 DIAGNOSIS — M1712 Unilateral primary osteoarthritis, left knee: Secondary | ICD-10-CM | POA: Diagnosis not present

## 2021-03-26 IMAGING — CT CT ABD-PELV W/ CM
2 of 12 series · 11 of 46 positions shown, 12 images · IV contrast (APPLIED)
Comparison: PET exam from [DATE].
COMPARISON: PET exam from [DATE].

Addendum:
CLINICAL DATA: A 65-year-old male presents with history of
pancreatic adenocarcinoma, follow-up evaluation.

EXAM:
CT CHEST, ABDOMEN, AND PELVIS WITH CONTRAST
TECHNIQUE: Multidetector CT imaging of the chest, abdomen and pelvis was
performed following the standard protocol during bolus
administration of intravenous contrast.
CONTRAST:  80mL OMNIPAQUE IOHEXOL 350 MG/ML SOLN

[Series 9: cor arterial · coronal · arterial · 0.46mm/px · 3 of 153 slices shown]
[im 39/153  soft-tissue]
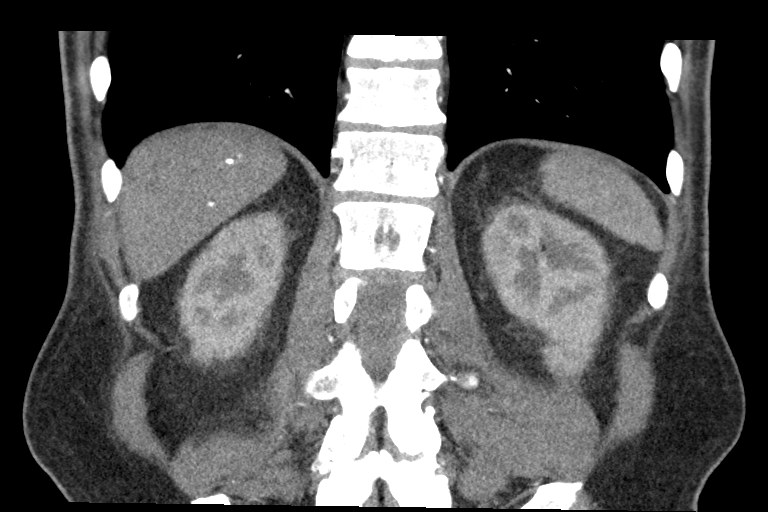
[im 77/153  soft-tissue]
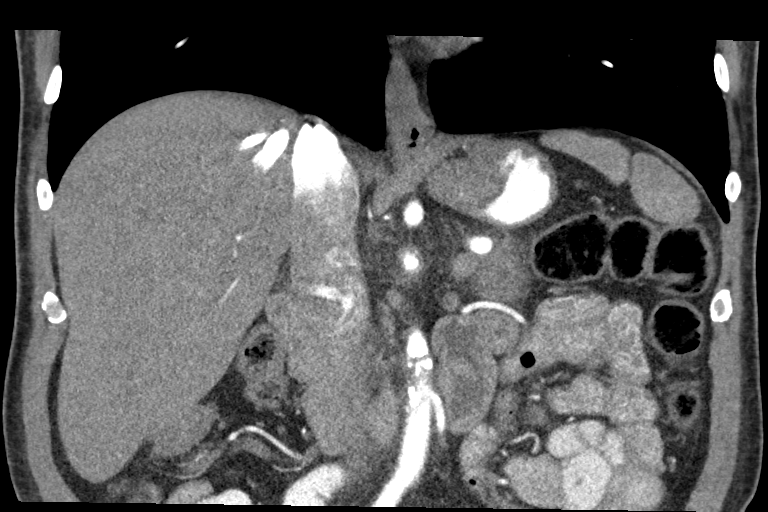
[im 115/153  soft-tissue]
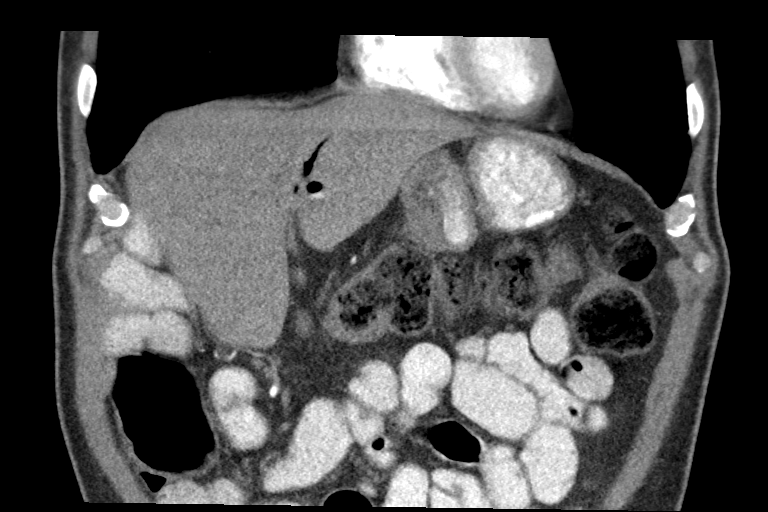

[Series 13: portal pv 2.0 · axial · portal-venous · 0.79mm/px · z∈[-520,+88]mm · 8 of 356 slices shown, 9 images]
[im 26/356  soft-tissue]
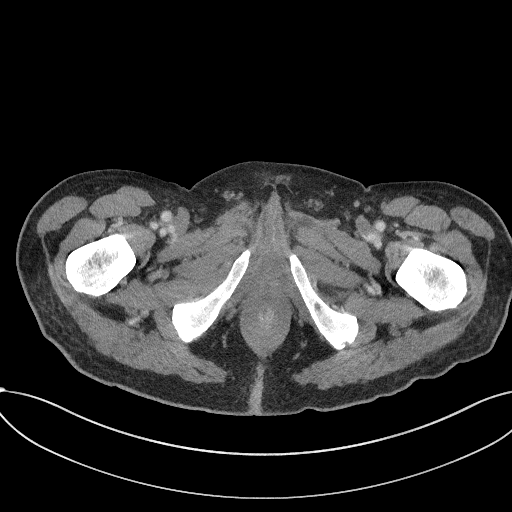
[im 26/356  bone]
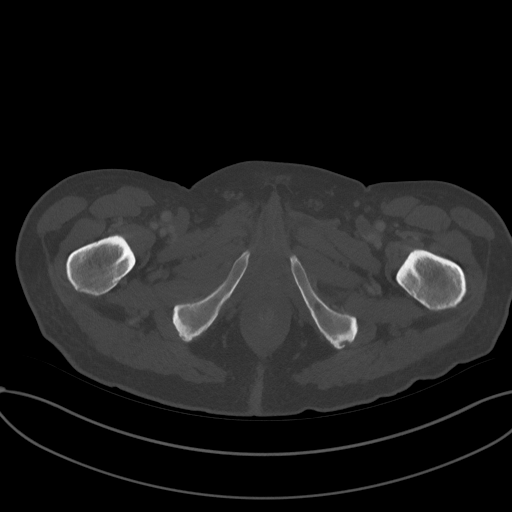
[im 77/356  soft-tissue]
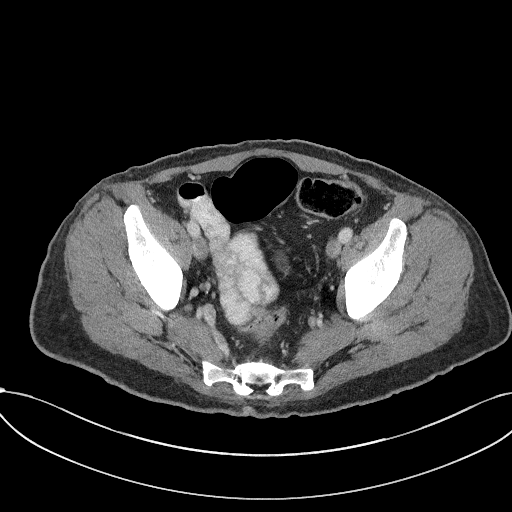
[im 127/356  soft-tissue]
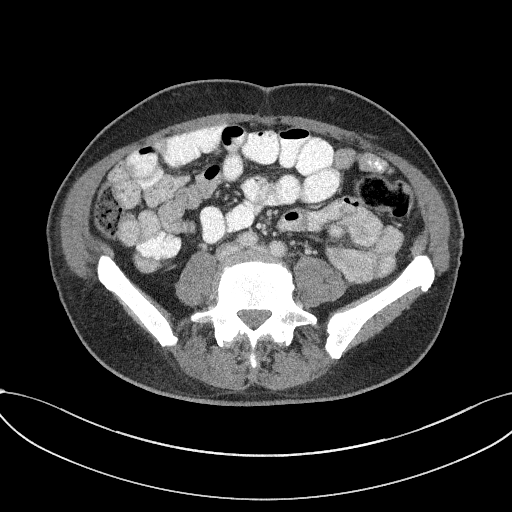
[im 153/356  soft-tissue]
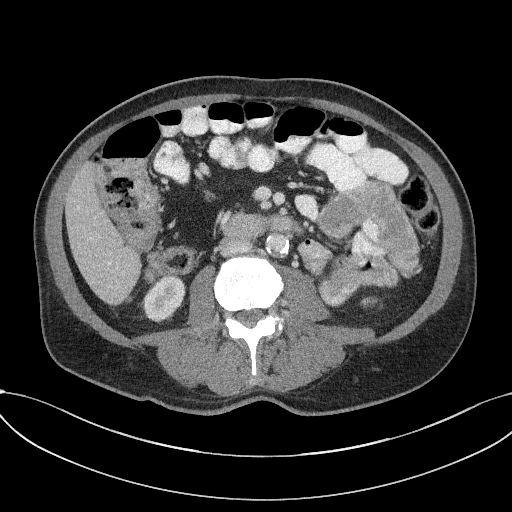
[im 203/356  soft-tissue]
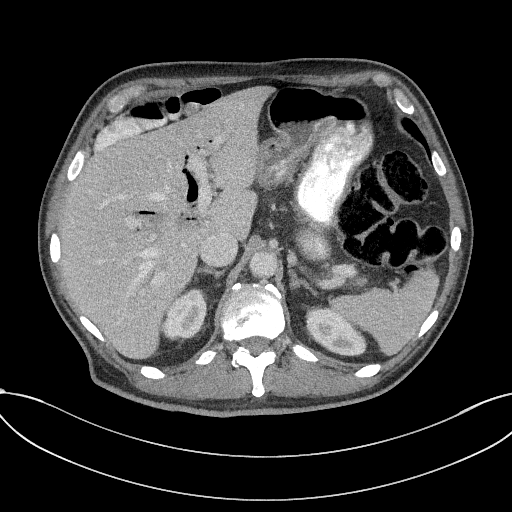
[im 229/356  soft-tissue]
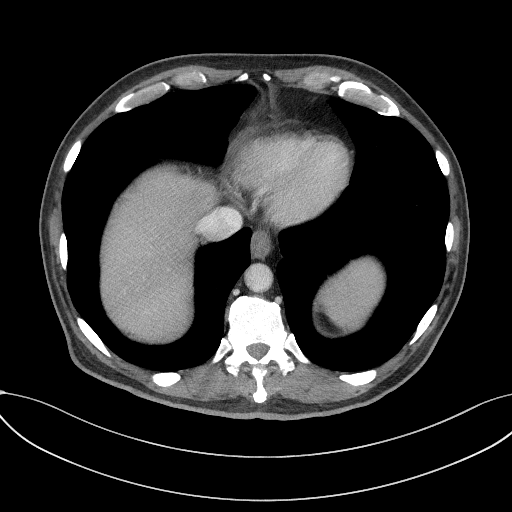
[im 279/356  soft-tissue]
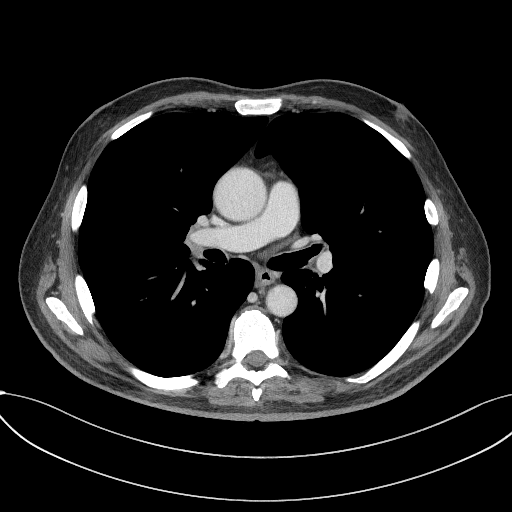
[im 330/356  soft-tissue]
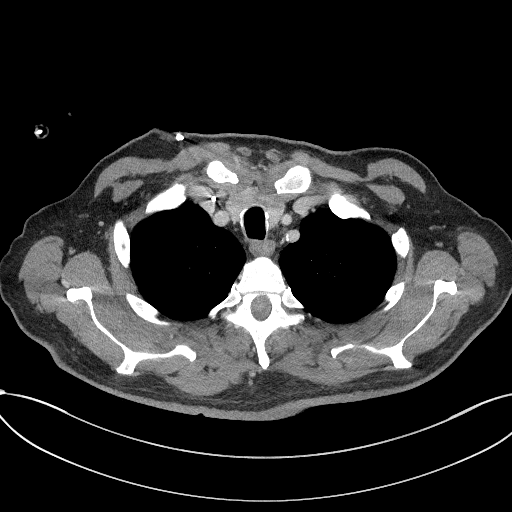

[11 of 46 positions shown; findings below may reference images not displayed]

FINDINGS: CT CHEST FINDINGS

Cardiovascular: Normal caliber of the thoracic aorta 4.1 cm caliber
of the ascending thoracic aorta. Central pulmonary vasculature is
unremarkable. Normal heart size without substantial pericardial
effusion. RIGHT-sided Port-A-Cath terminates at the caval to atrial
junction.

Mediastinum/Nodes: Mild circumferential thickening of the esophagus
may represent esophagitis, not substantially changed from previous
imaging. No thoracic inlet, axillary or mediastinal lymphadenopathy.
No hilar lymphadenopathy.

Lungs/Pleura: Small nodule along the minor fissure in the RIGHT
chest (image 173/12) 4 mm, in hindsight unchanged from previous
imaging, PET exam from [facility]. No effusion. No consolidative changes.

Musculoskeletal: See below for full musculoskeletal details.

CT ABDOMEN PELVIS FINDINGS

Hepatobiliary: Pneumobilia. No focal, suspicious hepatic lesion.
Biliary stent remains in place. Portal vein is distorted and
teardrop shaped as before.

Pancreas: Pancreatic mass is indistinct as before measuring
approximately 2.3 x 2.5 cm but with indistinct margins, based on
ductal transition. It previously measured approximately 2.0 x
cm.

Mass distorts the portal vein as before.

Proximally 180 degree involvement with soft tissue of the superior
mesenteric artery.

Abutment of replaced RIGHT hepatic artery this is at the origin of
the vessel (image 351/100).

Ductal destruction as before. Early upper abdominal venous
collaterals about the pancreatic head.

Stranding about the tail of the pancreas associated with ductal
dilation as before.

Spleen: Unremarkable.

Adrenals/Urinary Tract: Adrenal glands are normal.

Symmetric renal enhancement without hydronephrosis. Small cyst in
the RIGHT kidney measuring 12 mm. No hydronephrosis. No perivesical
stranding.

Stomach/Bowel: Small hiatal hernia.  Normal appendix.

Indistinct margins between duodenum and pancreatic tissue at the
level of the duodenum.

Vascular/Lymphatic: Scattered small lymph nodes throughout the
retroperitoneum are similar to the previous study. No pathologically
enlarged lymph nodes at developed since previous imaging.

Aortic atherosclerosis without aneurysm. Retroaortic LEFT renal
vein.

Reproductive: Unremarkable.

Other: No ascites.

Musculoskeletal: Spinal degenerative changes. No acute or
destructive bone findings.
IMPRESSION: Slight interval increase in size of pancreatic mass with indistinct
margins, based on ductal transition and pancreatic ductal dilation.
Distortion of the portal vein and abutment of SMA and replaced RIGHT
hepatic artery (arising from the SMA) appears similar to previous
imaging. No signs of metastatic disease.

Persistent stranding about the pancreatic tail. Correlate with any
abdominal symptoms. Findings are not substantially changed since
[DATE].

Biliary stent remains in place.

Tiny pulmonary nodule in the RIGHT upper lobe more likely a
subpleural lymph node showing no change.

4.1 cm ascending thoracic aorta. Recommend annual imaging followup
by CTA or MRA. This recommendation follows [9U]
ACCF/AHA/AATS/ACR/ASA/SCA/ADEN/ADEN/ADEN/ADEN Guidelines for the
Diagnosis and Management of Patients with Thoracic Aortic Disease.
Circulation. [9U]; 121: [id number]. Aortic aneurysm NOS ([9U]-[9U])

Aortic Atherosclerosis ([9U]-[9U]).

ADDENDUM:
Technique section should read CT of the abdomen without and chest,
abdomen and pelvis with intravenous contrast.

Multi detector imaging was acquired through the abdomen without
contrast, subsequently postcontrast imaging extending through the
chest, abdomen and pelvis according to standard pancreatic protocol
was obtained following the administration of intravenous iodinated
contrast.

No additional findings of note on arterial and delayed phases which
were not available at the time of initial interpretation.

*** End of Addendum ***
FINDINGS: CT CHEST FINDINGS

Cardiovascular: Normal caliber of the thoracic aorta 4.1 cm caliber
of the ascending thoracic aorta. Central pulmonary vasculature is
unremarkable. Normal heart size without substantial pericardial
effusion. RIGHT-sided Port-A-Cath terminates at the caval to atrial
junction.

Mediastinum/Nodes: Mild circumferential thickening of the esophagus
may represent esophagitis, not substantially changed from previous
imaging. No thoracic inlet, axillary or mediastinal lymphadenopathy.
No hilar lymphadenopathy.

Lungs/Pleura: Small nodule along the minor fissure in the RIGHT
chest (image 173/12) 4 mm, in hindsight unchanged from previous
imaging, PET exam from [facility]. No effusion. No consolidative changes.

Musculoskeletal: See below for full musculoskeletal details.

CT ABDOMEN PELVIS FINDINGS

Hepatobiliary: Pneumobilia. No focal, suspicious hepatic lesion.
Biliary stent remains in place. Portal vein is distorted and
teardrop shaped as before.

Pancreas: Pancreatic mass is indistinct as before measuring
approximately 2.3 x 2.5 cm but with indistinct margins, based on
ductal transition. It previously measured approximately 2.0 x
cm.

Mass distorts the portal vein as before.

Proximally 180 degree involvement with soft tissue of the superior
mesenteric artery.

Abutment of replaced RIGHT hepatic artery this is at the origin of
the vessel (image 351/100).

Ductal destruction as before. Early upper abdominal venous
collaterals about the pancreatic head.

Stranding about the tail of the pancreas associated with ductal
dilation as before.

Spleen: Unremarkable.

Adrenals/Urinary Tract: Adrenal glands are normal.

Symmetric renal enhancement without hydronephrosis. Small cyst in
the RIGHT kidney measuring 12 mm. No hydronephrosis. No perivesical
stranding.

Stomach/Bowel: Small hiatal hernia.  Normal appendix.

Indistinct margins between duodenum and pancreatic tissue at the
level of the duodenum.

Vascular/Lymphatic: Scattered small lymph nodes throughout the
retroperitoneum are similar to the previous study. No pathologically
enlarged lymph nodes at developed since previous imaging.

Aortic atherosclerosis without aneurysm. Retroaortic LEFT renal
vein.

Reproductive: Unremarkable.

Other: No ascites.

Musculoskeletal: Spinal degenerative changes. No acute or
destructive bone findings.
IMPRESSION: Slight interval increase in size of pancreatic mass with indistinct
margins, based on ductal transition and pancreatic ductal dilation.
Distortion of the portal vein and abutment of SMA and replaced RIGHT
hepatic artery (arising from the SMA) appears similar to previous
imaging. No signs of metastatic disease.

Persistent stranding about the pancreatic tail. Correlate with any
abdominal symptoms. Findings are not substantially changed since
[DATE].

Biliary stent remains in place.

Tiny pulmonary nodule in the RIGHT upper lobe more likely a
subpleural lymph node showing no change.

4.1 cm ascending thoracic aorta. Recommend annual imaging followup
by CTA or MRA. This recommendation follows [9U]
ACCF/AHA/AATS/ACR/ASA/SCA/ADEN/ADEN/ADEN/ADEN Guidelines for the
Diagnosis and Management of Patients with Thoracic Aortic Disease.
Circulation. [9U]; 121: [id number]. Aortic aneurysm NOS ([9U]-[9U])

Aortic Atherosclerosis ([9U]-[9U]).

## 2021-03-26 IMAGING — CT CT CHEST W/ CM
2 of 5 series · 11 of 46 positions shown, 12 images · IV contrast (APPLIED)
Comparison: PET exam from [DATE].
COMPARISON: PET exam from [DATE].

Addendum:
CLINICAL DATA: A 65-year-old male presents with history of
pancreatic adenocarcinoma, follow-up evaluation.

EXAM:
CT CHEST, ABDOMEN, AND PELVIS WITH CONTRAST
TECHNIQUE: Multidetector CT imaging of the chest, abdomen and pelvis was
performed following the standard protocol during bolus
administration of intravenous contrast.
CONTRAST:  80mL OMNIPAQUE IOHEXOL 350 MG/ML SOLN

[Series 12: portal venous thins · axial · portal-venous · 0.79mm/px · z∈[-512,+80]mm · 8 of 712 slices shown, 9 images]
[im 60/712  soft-tissue]
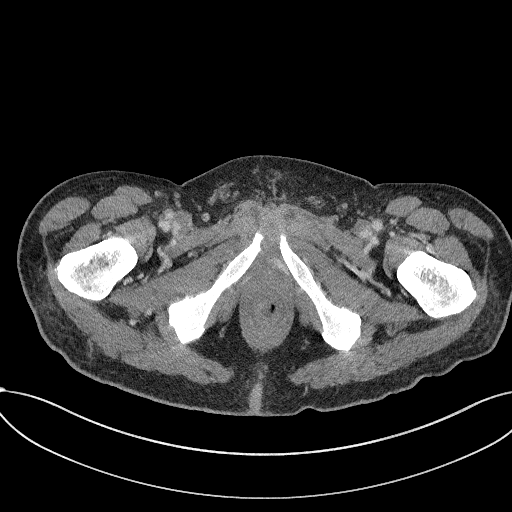
[im 60/712  bone]
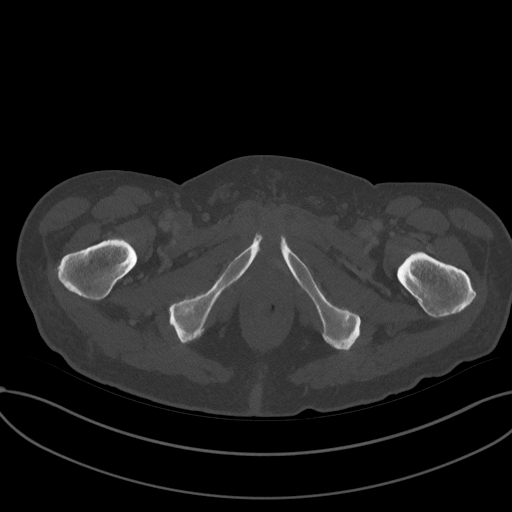
[im 149/712  soft-tissue]
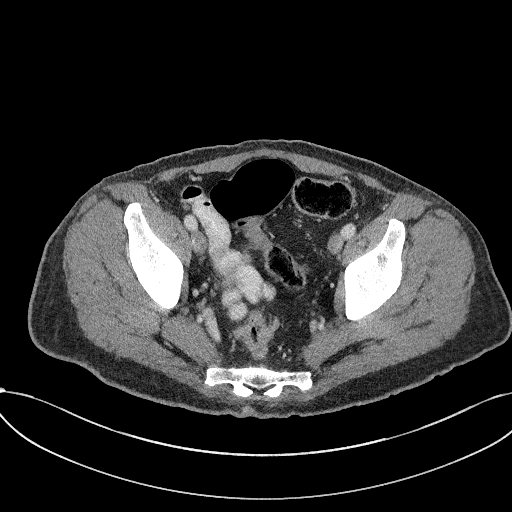
[im 238/712  soft-tissue]
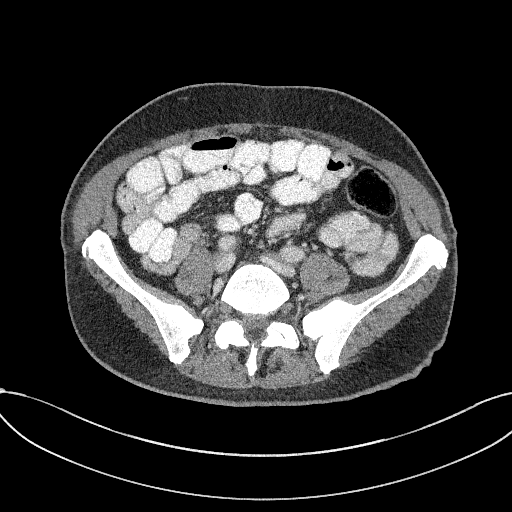
[im 326/712  soft-tissue]
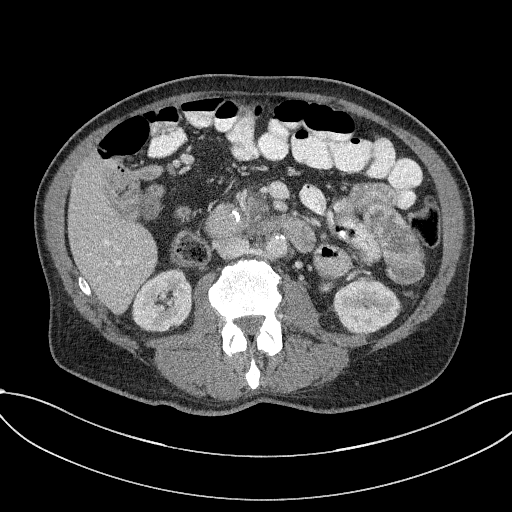
[im 386/712  soft-tissue]
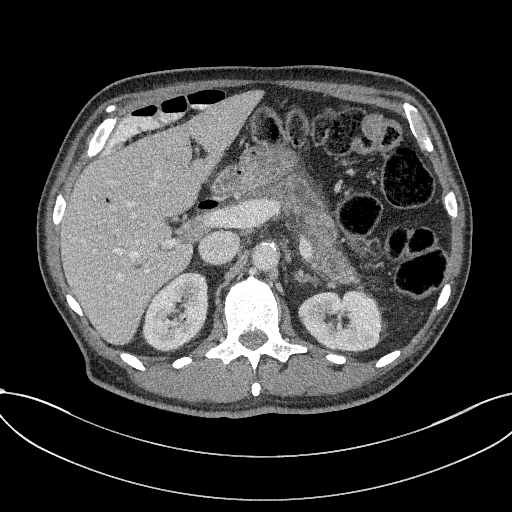
[im 475/712  soft-tissue]
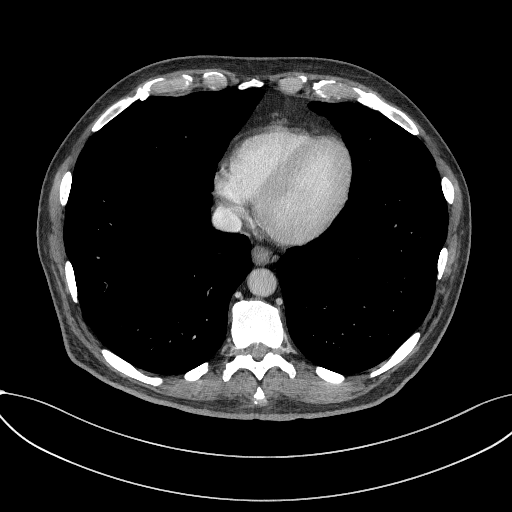
[im 563/712  soft-tissue]
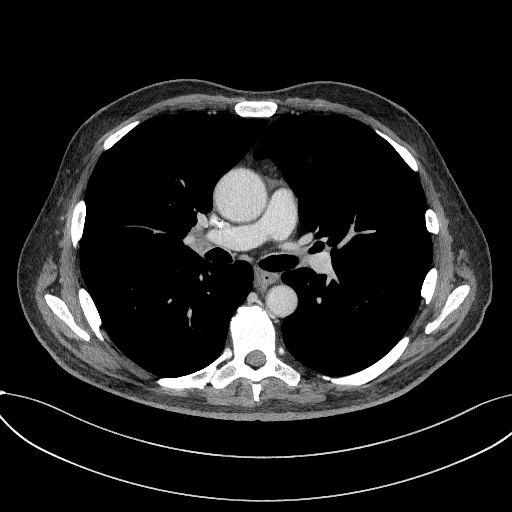
[im 652/712  soft-tissue]
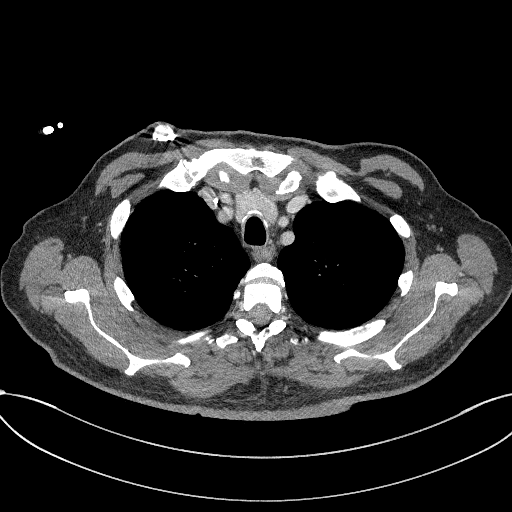

[Series 15: cor portal venous · coronal · portal-venous · 0.84mm/px · 3 of 159 slices shown]
[im 53/159  soft-tissue]
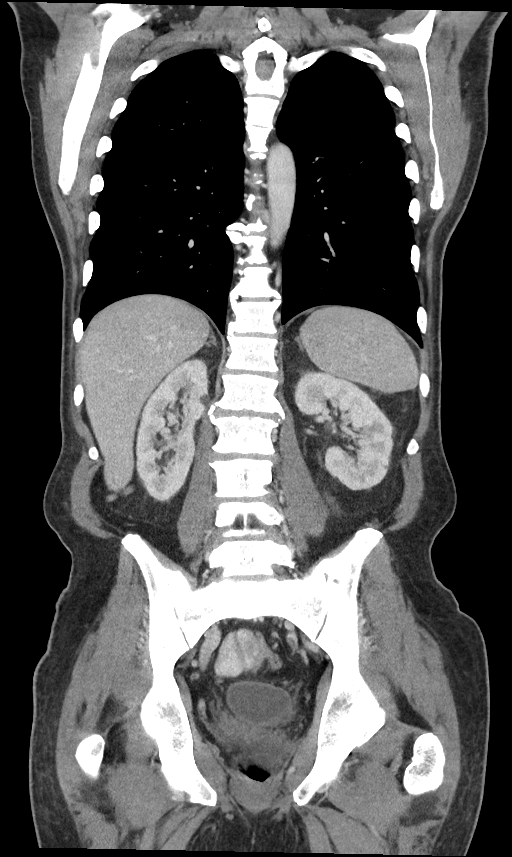
[im 71/159  soft-tissue]
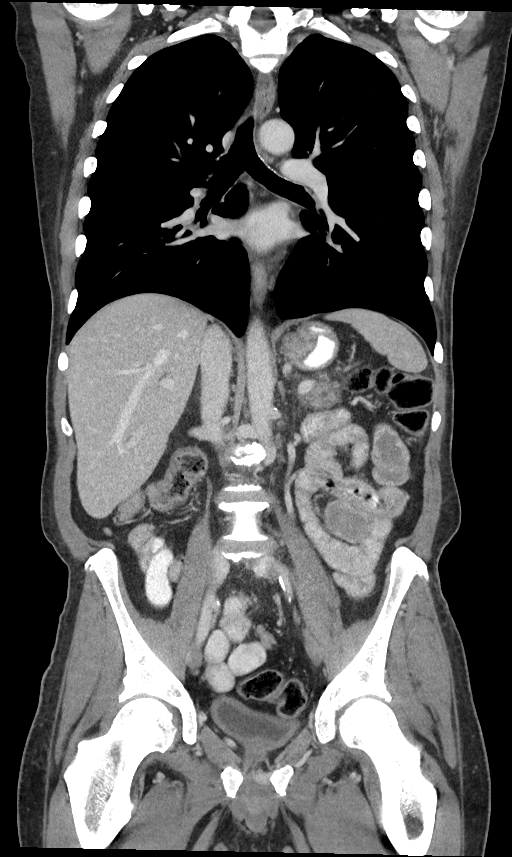
[im 88/159  soft-tissue]
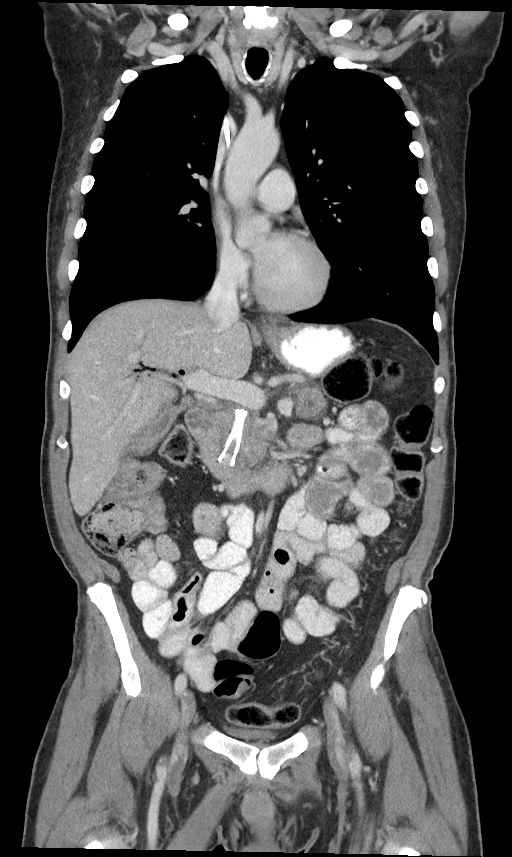

[11 of 46 positions shown; findings below may reference images not displayed]

FINDINGS: CT CHEST FINDINGS

Cardiovascular: Normal caliber of the thoracic aorta 4.1 cm caliber
of the ascending thoracic aorta. Central pulmonary vasculature is
unremarkable. Normal heart size without substantial pericardial
effusion. RIGHT-sided Port-A-Cath terminates at the caval to atrial
junction.

Mediastinum/Nodes: Mild circumferential thickening of the esophagus
may represent esophagitis, not substantially changed from previous
imaging. No thoracic inlet, axillary or mediastinal lymphadenopathy.
No hilar lymphadenopathy.

Lungs/Pleura: Small nodule along the minor fissure in the RIGHT
chest (image 173/12) 4 mm, in hindsight unchanged from previous
imaging, PET exam from [facility]. No effusion. No consolidative changes.

Musculoskeletal: See below for full musculoskeletal details.

CT ABDOMEN PELVIS FINDINGS

Hepatobiliary: Pneumobilia. No focal, suspicious hepatic lesion.
Biliary stent remains in place. Portal vein is distorted and
teardrop shaped as before.

Pancreas: Pancreatic mass is indistinct as before measuring
approximately 2.3 x 2.5 cm but with indistinct margins, based on
ductal transition. It previously measured approximately 2.0 x
cm.

Mass distorts the portal vein as before.

Proximally 180 degree involvement with soft tissue of the superior
mesenteric artery.

Abutment of replaced RIGHT hepatic artery this is at the origin of
the vessel (image 351/100).

Ductal destruction as before. Early upper abdominal venous
collaterals about the pancreatic head.

Stranding about the tail of the pancreas associated with ductal
dilation as before.

Spleen: Unremarkable.

Adrenals/Urinary Tract: Adrenal glands are normal.

Symmetric renal enhancement without hydronephrosis. Small cyst in
the RIGHT kidney measuring 12 mm. No hydronephrosis. No perivesical
stranding.

Stomach/Bowel: Small hiatal hernia.  Normal appendix.

Indistinct margins between duodenum and pancreatic tissue at the
level of the duodenum.

Vascular/Lymphatic: Scattered small lymph nodes throughout the
retroperitoneum are similar to the previous study. No pathologically
enlarged lymph nodes at developed since previous imaging.

Aortic atherosclerosis without aneurysm. Retroaortic LEFT renal
vein.

Reproductive: Unremarkable.

Other: No ascites.

Musculoskeletal: Spinal degenerative changes. No acute or
destructive bone findings.
IMPRESSION: Slight interval increase in size of pancreatic mass with indistinct
margins, based on ductal transition and pancreatic ductal dilation.
Distortion of the portal vein and abutment of SMA and replaced RIGHT
hepatic artery (arising from the SMA) appears similar to previous
imaging. No signs of metastatic disease.

Persistent stranding about the pancreatic tail. Correlate with any
abdominal symptoms. Findings are not substantially changed since
[DATE].

Biliary stent remains in place.

Tiny pulmonary nodule in the RIGHT upper lobe more likely a
subpleural lymph node showing no change.

4.1 cm ascending thoracic aorta. Recommend annual imaging followup
by CTA or MRA. This recommendation follows [9U]
ACCF/AHA/AATS/ACR/ASA/SCA/ADEN/ADEN/ADEN/ADEN Guidelines for the
Diagnosis and Management of Patients with Thoracic Aortic Disease.
Circulation. [9U]; 121: [id number]. Aortic aneurysm NOS ([9U]-[9U])

Aortic Atherosclerosis ([9U]-[9U]).

ADDENDUM:
Technique section should read CT of the abdomen without and chest,
abdomen and pelvis with intravenous contrast.

Multi detector imaging was acquired through the abdomen without
contrast, subsequently postcontrast imaging extending through the
chest, abdomen and pelvis according to standard pancreatic protocol
was obtained following the administration of intravenous iodinated
contrast.

No additional findings of note on arterial and delayed phases which
were not available at the time of initial interpretation.

*** End of Addendum ***
FINDINGS: CT CHEST FINDINGS

Cardiovascular: Normal caliber of the thoracic aorta 4.1 cm caliber
of the ascending thoracic aorta. Central pulmonary vasculature is
unremarkable. Normal heart size without substantial pericardial
effusion. RIGHT-sided Port-A-Cath terminates at the caval to atrial
junction.

Mediastinum/Nodes: Mild circumferential thickening of the esophagus
may represent esophagitis, not substantially changed from previous
imaging. No thoracic inlet, axillary or mediastinal lymphadenopathy.
No hilar lymphadenopathy.

Lungs/Pleura: Small nodule along the minor fissure in the RIGHT
chest (image 173/12) 4 mm, in hindsight unchanged from previous
imaging, PET exam from [facility]. No effusion. No consolidative changes.

Musculoskeletal: See below for full musculoskeletal details.

CT ABDOMEN PELVIS FINDINGS

Hepatobiliary: Pneumobilia. No focal, suspicious hepatic lesion.
Biliary stent remains in place. Portal vein is distorted and
teardrop shaped as before.

Pancreas: Pancreatic mass is indistinct as before measuring
approximately 2.3 x 2.5 cm but with indistinct margins, based on
ductal transition. It previously measured approximately 2.0 x
cm.

Mass distorts the portal vein as before.

Proximally 180 degree involvement with soft tissue of the superior
mesenteric artery.

Abutment of replaced RIGHT hepatic artery this is at the origin of
the vessel (image 351/100).

Ductal destruction as before. Early upper abdominal venous
collaterals about the pancreatic head.

Stranding about the tail of the pancreas associated with ductal
dilation as before.

Spleen: Unremarkable.

Adrenals/Urinary Tract: Adrenal glands are normal.

Symmetric renal enhancement without hydronephrosis. Small cyst in
the RIGHT kidney measuring 12 mm. No hydronephrosis. No perivesical
stranding.

Stomach/Bowel: Small hiatal hernia.  Normal appendix.

Indistinct margins between duodenum and pancreatic tissue at the
level of the duodenum.

Vascular/Lymphatic: Scattered small lymph nodes throughout the
retroperitoneum are similar to the previous study. No pathologically
enlarged lymph nodes at developed since previous imaging.

Aortic atherosclerosis without aneurysm. Retroaortic LEFT renal
vein.

Reproductive: Unremarkable.

Other: No ascites.

Musculoskeletal: Spinal degenerative changes. No acute or
destructive bone findings.
IMPRESSION: Slight interval increase in size of pancreatic mass with indistinct
margins, based on ductal transition and pancreatic ductal dilation.
Distortion of the portal vein and abutment of SMA and replaced RIGHT
hepatic artery (arising from the SMA) appears similar to previous
imaging. No signs of metastatic disease.

Persistent stranding about the pancreatic tail. Correlate with any
abdominal symptoms. Findings are not substantially changed since
[DATE].

Biliary stent remains in place.

Tiny pulmonary nodule in the RIGHT upper lobe more likely a
subpleural lymph node showing no change.

4.1 cm ascending thoracic aorta. Recommend annual imaging followup
by CTA or MRA. This recommendation follows [9U]
ACCF/AHA/AATS/ACR/ASA/SCA/ADEN/ADEN/ADEN/ADEN Guidelines for the
Diagnosis and Management of Patients with Thoracic Aortic Disease.
Circulation. [9U]; 121: [id number]. Aortic aneurysm NOS ([9U]-[9U])

Aortic Atherosclerosis ([9U]-[9U]).

## 2021-03-26 MED ORDER — HEPARIN SOD (PORK) LOCK FLUSH 100 UNIT/ML IV SOLN
500.0000 [IU] | Freq: Once | INTRAVENOUS | Status: AC
  Administered 2021-03-26: 500 [IU] via INTRAVENOUS

## 2021-03-26 MED ORDER — IOHEXOL 350 MG/ML SOLN
80.0000 mL | Freq: Once | INTRAVENOUS | Status: AC | PRN
  Administered 2021-03-26: 80 mL via INTRAVENOUS

## 2021-03-27 NOTE — Progress Notes (Signed)
[place], [place] [address]   CLINIC:  Medical Oncology/Hematology  PCP:  [name], MD [address] [place] / [place] [address] [phone]   REASON FOR VISIT:  Follow-up for pancreatic adenocarcinoma  PRIOR THERAPY: none  NGS Results: not done  CURRENT THERAPY: FOLFIRINOX every 2 weeks x 4 cycles  BRIEF ONCOLOGIC HISTORY:  Oncology History  Pancreatic adenocarcinoma ([facility])  12/04/2020 Initial Diagnosis   Pancreatic adenocarcinoma ([place])   01/10/2021 -  Chemotherapy   Patient is on Treatment Plan : PANCREAS Modified FOLFIRINOX q14d x 4 cycles       CANCER STAGING: Cancer Staging Pancreatic adenocarcinoma ([place]) Staging form: Exocrine Pancreas, AJCC 8th Edition - Clinical stage from 12/04/2020: Stage IB (cT2, cN0, cM0) - Unsigned   INTERVAL HISTORY:  [name], a 65 y.o. male, returns for routine follow-up and consideration for next cycle of chemotherapy. Ronson was last seen on 03/14/2021.  Due for cycle #6 of FOLFIRINOX today.   Overall, he tells me he has been feeling pretty well. He reports cold sensitivity in his hands, and sensitivity to cold beverages following his previous treatment. His appetite is still poor, but he is eating better. His fatigue is stable. He denies n/v/d, mouth sores, and abdominal pain. He gained 3 pounds since his last visit. He reports dry mouth which makes it difficulty to chew and swallow meat and eggs, and occasional ankle swellings.  Overall, he will not receive his next cycle of chemo today.   REVIEW OF SYSTEMS:  Review of Systems  Constitutional:  Negative for appetite change (50%), fatigue (75%) and unexpected weight change (+3 lbs).  HENT:   Positive for trouble swallowing (due to dry mouth). Negative for mouth sores.        Dry mouth  Cardiovascular:  Negative for leg swelling (ankle).  Gastrointestinal:  Negative for diarrhea, nausea and vomiting.  All other  systems reviewed and are negative.  PAST MEDICAL/SURGICAL HISTORY:  Past Medical History:  Diagnosis Date   Anemia, iron deficiency    Diabetes mellitus without complication ([facility])    on meds   Hearing loss    Bil hearing aids   Hyperlipidemia    Hypertension    Port-A-Cath in place 12/26/2020   Prediabetes    Sleep apnea    Past Surgical History:  Procedure Laterality Date   BILIARY STENT PLACEMENT N/A 11/09/2020   Procedure: BILIARY STENT PLACEMENT - 80 F X 5 CM STENT;  Surgeon: Rogene Houston, MD;  Location: [facility];  Service: Endoscopy;  Laterality: N/A;   BILIARY STENT PLACEMENT N/A 12/27/2020   Procedure: BILIARY STENT PLACEMENT;  Surgeon: Rogene Houston, MD;  Location: [facility];  Service: Endoscopy;  Laterality: N/A;   COLONOSCOPY     ERCP N/A 11/09/2020   Procedure: ENDOSCOPIC RETROGRADE CHOLANGIOPANCREATOGRAPHY (ERCP);  Surgeon: Rogene Houston, MD;  Location: [facility];  Service: Endoscopy;  Laterality: N/A;   ERCP N/A 12/27/2020   Procedure: ENDOSCOPIC RETROGRADE CHOLANGIOPANCREATOGRAPHY (ERCP);  Surgeon: Rogene Houston, MD;  Location: [facility];  Service: Endoscopy;  Laterality: N/A;   ESOPHAGOGASTRODUODENOSCOPY (EGD) WITH PROPOFOL N/A 11/30/2020   Procedure: ESOPHAGOGASTRODUODENOSCOPY (EGD) WITH PROPOFOL;  Surgeon: Milus Banister, MD;  Location: [facility];  Service: Endoscopy;  Laterality: N/A;   EUS N/A 11/30/2020   Procedure: UPPER ENDOSCOPIC ULTRASOUND (EUS) RADIAL;  Surgeon: Milus Banister, MD;  Location: [facility];  Service: Endoscopy;  Laterality: N/A;   FINE  NEEDLE ASPIRATION N/A 11/30/2020   Procedure: FINE NEEDLE ASPIRATION (FNA) LINEAR;  Surgeon: Milus Banister, MD;  Location: [facility];  Service: Endoscopy;  Laterality: N/A;   GIVENS CAPSULE STUDY     IR IMAGING GUIDED PORT INSERTION  12/18/2020   KNEE SURGERY     4 surgeries on left, 2 on right knee   SHOULDER SURGERY     right shoulder   SPHINCTEROTOMY N/A 11/09/2020   Procedure: SHORT BILIARY  SPHINCTEROTOMY;  Surgeon: Rogene Houston, MD;  Location: [facility];  Service: Endoscopy;  Laterality: N/A;    SOCIAL HISTORY:  Social History   Socioeconomic History   Marital status: Married    Spouse name: Not on file   Number of children: Not on file   Years of education: Not on file   Highest education level: Not on file  Occupational History   Not on file  Tobacco Use   Smoking status: Every Day    Types: Cigars   Smokeless tobacco: Never   Tobacco comments:    Smoke 3-4 cigars a day  Vaping Use   Vaping Use: Never used  Substance and Sexual Activity   Alcohol use: Yes    Comment: occasional   Drug use: Never   Sexual activity: Not on file  Other Topics Concern   Not on file  Social History Narrative   Not on file   Social Determinants of Health   Financial Resource Strain: Low Risk    Difficulty of Paying Living Expenses: Not hard at all  Food Insecurity: No Food Insecurity   Worried About Charity fundraiser in the Last Year: Never true   [place] in the Last Year: Never true  Transportation Needs: No Transportation Needs   Lack of Transportation (Medical): No   Lack of Transportation (Non-Medical): No  Physical Activity: Sufficiently Active   Days of Exercise per Week: 5 days   Minutes of Exercise per Session: 30 min  Stress: Not on file  Social Connections: Not on file  Intimate Partner Violence: Not At Risk   Fear of Current or Ex-Partner: No   Emotionally Abused: No   Physically Abused: No   Sexually Abused: No    FAMILY HISTORY:  Family History  Problem Relation Age of Onset   Heart attack Father    Heart attack Paternal Grandfather    Leukemia Sister    Heart attack Brother     CURRENT MEDICATIONS:  Current Outpatient Medications  Medication Sig Dispense Refill   Continuous Blood Gluc Receiver (FREESTYLE LIBRE 2 READER) DEVI As directed 1 each 0   Continuous Blood Gluc Sensor (FREESTYLE LIBRE 2 SENSOR) MISC 1 Piece by Does not  apply route every 14 (fourteen) days. 6 each 2   fluorouracil CALGB 98338 2,400 mg/m2 in sodium chloride 0.9 % 150 mL Inject 2,400 mg/m2 into the vein over 48 hr. Every 14 days     Glucosamine-Chondroitin (OSTEO BI-FLEX REGULAR STRENGTH PO) Take 2 tablets by mouth daily.     insulin detemir (LEVEMIR) 100 UNIT/ML FlexPen Inject 35 Units into the skin at bedtime. 45 mL 0   insulin lispro (HUMALOG KWIKPEN) 100 UNIT/ML KwikPen Inject 10-16 Units into the skin 3 (three) times daily before meals. 45 mL 1   IRINOTECAN HCL IV Inject 150 mg/m2 into the vein every 14 (fourteen) days.     IRON-VITAMIN C PO Take 1 tablet by mouth daily.     LEUCOVORIN CALCIUM IV Inject 400 mg/m2  into the vein every 14 (fourteen) days.     levothyroxine (SYNTHROID) 175 MCG tablet TAKE 1 TABLET DAILY (NEEDS VISIT IN [place]) 90 tablet 1   lidocaine-prilocaine (EMLA) cream Apply a small amount to port a cath site (do not rub in) and cover with plastic wrap 1 hour prior to infusion appointments 30 g 3   megestrol (MEGACE) 400 MG/10ML suspension Take 10 mLs (400 mg total) by mouth 2 (two) times daily. 240 mL 0   Melatonin-Pyridoxine (MELATIN PO) Take by mouth. Takes 2 qhs     ondansetron (ZOFRAN) 8 MG tablet Take 1 tablet (8 mg total) by mouth 2 (two) times daily as needed. Start on day 3 after chemotherapy. 30 tablet 1   OXALIPLATIN IV Inject 85 mg/m2 into the vein every 14 (fourteen) days.     oxyCODONE-acetaminophen (PERCOCET/ROXICET) 5-325 MG tablet Take 1 tablet by mouth every 6 (six) hours as needed for severe pain.     prochlorperazine (COMPAZINE) 10 MG tablet Take 10 mg by mouth every 6 (six) hours as needed for nausea or vomiting.     No current facility-administered medications for this visit.   Facility-Administered Medications Ordered in Other Visits  Medication Dose Route Frequency Provider Last Rate Last Admin   alteplase (CATHFLO ACTIVASE) injection 2 mg  2 mg Intracatheter Once PRN Derek Jack, MD        heparin lock flush 100 unit/mL  250 Units Intracatheter Once PRN Derek Jack, MD       sodium chloride flush (NS) 0.9 % injection 10 mL  10 mL Intracatheter PRN Derek Jack, MD   10 mL at 01/26/21 1314   sodium chloride flush (NS) 0.9 % injection 3 mL  3 mL Intracatheter PRN Derek Jack, MD        ALLERGIES:  Allergies  Allergen Reactions   Lisinopril Cough    PHYSICAL EXAM:  Performance status (ECOG): 1 - Symptomatic but completely ambulatory  There were no vitals filed for this visit. Wt Readings from Last 3 Encounters:  03/28/21 191 lb 3.2 oz (86.7 kg)  03/14/21 189 lb (85.7 kg)  02/28/21 193 lb 4.8 oz (87.7 kg)   Physical Exam Vitals reviewed.  Constitutional:      Appearance: Normal appearance.  Cardiovascular:     Rate and Rhythm: Normal rate and regular rhythm.     Pulses: Normal pulses.     Heart sounds: Normal heart sounds.  Pulmonary:     Effort: Pulmonary effort is normal.     Breath sounds: Normal breath sounds.  Abdominal:     Palpations: Abdomen is soft.     Tenderness: There is no abdominal tenderness.  Musculoskeletal:     Right lower leg: No edema.     Left lower leg: No edema.  Neurological:     General: No focal deficit present.     Mental Status: He is alert and oriented to person, place, and time.  Psychiatric:        Mood and Affect: Mood normal.        Behavior: Behavior normal.    LABORATORY DATA:  I have reviewed the labs as listed.  CBC Latest Ref Rng & Units 03/28/2021 03/14/2021 02/28/2021  WBC 4.0 - 10.5 K/uL 15.1(H) 14.6(H) 12.4(H)  Hemoglobin 13.0 - 17.0 g/dL 11.7(L) 11.8(L) 12.3(L)  Hematocrit 39.0 - 52.0 % 35.1(L) 37.2(L) 37.3(L)  Platelets 150 - 400 K/uL 324 409(H) 377   CMP Latest Ref Rng & Units 03/28/2021 03/14/2021 02/28/2021  Glucose 70 -  99 mg/dL 145(H) 172(H) 157(H)  BUN 8 - 23 mg/dL 12 11 14   Creatinine 0.61 - 1.24 mg/dL 0.79 0.81 0.77  Sodium 135 - 145 mmol/L 137 135 136  Potassium 3.5 - 5.1  mmol/L 3.8 3.8 3.9  Chloride 98 - 111 mmol/L 106 107 106  CO2 22 - 32 mmol/L 21(L) 21(L) 23  Calcium 8.9 - 10.3 mg/dL 8.2(L) 8.2(L) 7.8(L)  Total Protein 6.5 - 8.1 g/dL 6.4(L) 6.5 5.9(L)  Total Bilirubin 0.3 - 1.2 mg/dL 0.3 0.4 0.5  Alkaline Phos 38 - 126 U/L 253(H) 194(H) 164(H)  AST 15 - 41 U/L 26 24 22   ALT 0 - 44 U/L 33 27 21    DIAGNOSTIC IMAGING:  I have independently reviewed the scans and discussed with the patient. CT CHEST W CONTRAST  Addendum Date: 03/26/2021   ADDENDUM REPORT: 03/26/2021 16:33 ADDENDUM: Technique section should read CT of the abdomen without and chest, abdomen and pelvis with intravenous contrast. Multi detector imaging was acquired through the abdomen without contrast, subsequently postcontrast imaging extending through the chest, abdomen and pelvis according to standard pancreatic protocol was obtained following the administration of intravenous iodinated contrast. No additional findings of note on arterial and delayed phases which were not available at the time of initial interpretation. Electronically Signed   By: Zetta Bills M.D.   On: 03/26/2021 16:33   Result Date: 03/26/2021 CLINICAL DATA:  A 65 year old male presents with history of pancreatic adenocarcinoma, follow-up evaluation. EXAM: CT CHEST, ABDOMEN, AND PELVIS WITH CONTRAST TECHNIQUE: Multidetector CT imaging of the chest, abdomen and pelvis was performed following the standard protocol during bolus administration of intravenous contrast. CONTRAST:  84m OMNIPAQUE IOHEXOL 350 MG/ML SOLN COMPARISON:  PET exam from December 16, 2020. FINDINGS: CT CHEST FINDINGS Cardiovascular: Normal caliber of the thoracic aorta 4.1 cm caliber of the ascending thoracic aorta. Central pulmonary vasculature is unremarkable. Normal heart size without substantial pericardial effusion. RIGHT-sided Port-A-Cath terminates at the caval to atrial junction. Mediastinum/Nodes: Mild circumferential thickening of the esophagus may  represent esophagitis, not substantially changed from previous imaging. No thoracic inlet, axillary or mediastinal lymphadenopathy. No hilar lymphadenopathy. Lungs/Pleura: Small nodule along the minor fissure in the RIGHT chest (image 173/12) 4 mm, in hindsight unchanged from previous imaging, PET exam from July. No effusion. No consolidative changes. Musculoskeletal: See below for full musculoskeletal details. CT ABDOMEN PELVIS FINDINGS Hepatobiliary: Pneumobilia. No focal, suspicious hepatic lesion. Biliary stent remains in place. Portal vein is distorted and teardrop shaped as before. Pancreas: Pancreatic mass is indistinct as before measuring approximately 2.3 x 2.5 cm but with indistinct margins, based on ductal transition. It previously measured approximately 2.0 x 1.9 cm. Mass distorts the portal vein as before. Proximally 180 degree involvement with soft tissue of the superior mesenteric artery. Abutment of replaced RIGHT hepatic artery this is at the origin of the vessel (image 351/100). Ductal destruction as before. Early upper abdominal venous collaterals about the pancreatic head. Stranding about the tail of the pancreas associated with ductal dilation as before. Spleen: Unremarkable. Adrenals/Urinary Tract: Adrenal glands are normal. Symmetric renal enhancement without hydronephrosis. Small cyst in the RIGHT kidney measuring 12 mm. No hydronephrosis. No perivesical stranding. Stomach/Bowel: Small hiatal hernia.  Normal appendix. Indistinct margins between duodenum and pancreatic tissue at the level of the duodenum. Vascular/Lymphatic: Scattered small lymph nodes throughout the retroperitoneum are similar to the previous study. No pathologically enlarged lymph nodes at developed since previous imaging. Aortic atherosclerosis without aneurysm. Retroaortic LEFT renal vein. Reproductive: Unremarkable. Other:  No ascites. Musculoskeletal: Spinal degenerative changes. No acute or destructive bone findings.  IMPRESSION: Slight interval increase in size of pancreatic mass with indistinct margins, based on ductal transition and pancreatic ductal dilation. Distortion of the portal vein and abutment of SMA and replaced RIGHT hepatic artery (arising from the SMA) appears similar to previous imaging. No signs of metastatic disease. Persistent stranding about the pancreatic tail. Correlate with any abdominal symptoms. Findings are not substantially changed since July of 2022. Biliary stent remains in place. Tiny pulmonary nodule in the RIGHT upper lobe more likely a subpleural lymph node showing no change. 4.1 cm ascending thoracic aorta. Recommend annual imaging followup by CTA or MRA. This recommendation follows 2010 ACCF/AHA/AATS/ACR/ASA/SCA/SCAI/SIR/STS/SVM Guidelines for the Diagnosis and Management of Patients with Thoracic Aortic Disease. Circulation. 2010; 121: V291-B166. Aortic aneurysm NOS (ICD10-I71.9) Aortic Atherosclerosis (ICD10-I70.0). Electronically Signed: By: Zetta Bills M.D. On: 03/26/2021 16:03   CT ABDOMEN PELVIS W CONTRAST  Addendum Date: 03/26/2021   ADDENDUM REPORT: 03/26/2021 16:33 ADDENDUM: Technique section should read CT of the abdomen without and chest, abdomen and pelvis with intravenous contrast. Multi detector imaging was acquired through the abdomen without contrast, subsequently postcontrast imaging extending through the chest, abdomen and pelvis according to standard pancreatic protocol was obtained following the administration of intravenous iodinated contrast. No additional findings of note on arterial and delayed phases which were not available at the time of initial interpretation. Electronically Signed   By: Zetta Bills M.D.   On: 03/26/2021 16:33   Result Date: 03/26/2021 CLINICAL DATA:  A 65 year old male presents with history of pancreatic adenocarcinoma, follow-up evaluation. EXAM: CT CHEST, ABDOMEN, AND PELVIS WITH CONTRAST TECHNIQUE: Multidetector CT imaging of the  chest, abdomen and pelvis was performed following the standard protocol during bolus administration of intravenous contrast. CONTRAST:  61m OMNIPAQUE IOHEXOL 350 MG/ML SOLN COMPARISON:  PET exam from December 16, 2020. FINDINGS: CT CHEST FINDINGS Cardiovascular: Normal caliber of the thoracic aorta 4.1 cm caliber of the ascending thoracic aorta. Central pulmonary vasculature is unremarkable. Normal heart size without substantial pericardial effusion. RIGHT-sided Port-A-Cath terminates at the caval to atrial junction. Mediastinum/Nodes: Mild circumferential thickening of the esophagus may represent esophagitis, not substantially changed from previous imaging. No thoracic inlet, axillary or mediastinal lymphadenopathy. No hilar lymphadenopathy. Lungs/Pleura: Small nodule along the minor fissure in the RIGHT chest (image 173/12) 4 mm, in hindsight unchanged from previous imaging, PET exam from July. No effusion. No consolidative changes. Musculoskeletal: See below for full musculoskeletal details. CT ABDOMEN PELVIS FINDINGS Hepatobiliary: Pneumobilia. No focal, suspicious hepatic lesion. Biliary stent remains in place. Portal vein is distorted and teardrop shaped as before. Pancreas: Pancreatic mass is indistinct as before measuring approximately 2.3 x 2.5 cm but with indistinct margins, based on ductal transition. It previously measured approximately 2.0 x 1.9 cm. Mass distorts the portal vein as before. Proximally 180 degree involvement with soft tissue of the superior mesenteric artery. Abutment of replaced RIGHT hepatic artery this is at the origin of the vessel (image 351/100). Ductal destruction as before. Early upper abdominal venous collaterals about the pancreatic head. Stranding about the tail of the pancreas associated with ductal dilation as before. Spleen: Unremarkable. Adrenals/Urinary Tract: Adrenal glands are normal. Symmetric renal enhancement without hydronephrosis. Small cyst in the RIGHT kidney  measuring 12 mm. No hydronephrosis. No perivesical stranding. Stomach/Bowel: Small hiatal hernia.  Normal appendix. Indistinct margins between duodenum and pancreatic tissue at the level of the duodenum. Vascular/Lymphatic: Scattered small lymph nodes throughout the retroperitoneum are similar to  the previous study. No pathologically enlarged lymph nodes at developed since previous imaging. Aortic atherosclerosis without aneurysm. Retroaortic LEFT renal vein. Reproductive: Unremarkable. Other: No ascites. Musculoskeletal: Spinal degenerative changes. No acute or destructive bone findings. IMPRESSION: Slight interval increase in size of pancreatic mass with indistinct margins, based on ductal transition and pancreatic ductal dilation. Distortion of the portal vein and abutment of SMA and replaced RIGHT hepatic artery (arising from the SMA) appears similar to previous imaging. No signs of metastatic disease. Persistent stranding about the pancreatic tail. Correlate with any abdominal symptoms. Findings are not substantially changed since July of 2022. Biliary stent remains in place. Tiny pulmonary nodule in the RIGHT upper lobe more likely a subpleural lymph node showing no change. 4.1 cm ascending thoracic aorta. Recommend annual imaging followup by CTA or MRA. This recommendation follows 2010 ACCF/AHA/AATS/ACR/ASA/SCA/SCAI/SIR/STS/SVM Guidelines for the Diagnosis and Management of Patients with Thoracic Aortic Disease. Circulation. 2010; 121: D924-Q683. Aortic aneurysm NOS (ICD10-I71.9) Aortic Atherosclerosis (ICD10-I70.0). Electronically Signed: By: Zetta Bills M.D. On: 03/26/2021 16:03     ASSESSMENT:  1.  Stage I (T2N0) pancreatic adenocarcinoma: - Recent admission to the hospital with DKA, found to have elevated liver enzymes. - MRCP without contrast on 11/08/2020 showed 2.3 x 2 cm central pancreatic head and uncinate mass with intra and extrahepatic biliary ductal dilation. - 11/09/2020-ERCP  sphincterotomy and stenting with findings showing short high-grade distal CBD stricture with dilated CBD up to 14 mm.  Bile duct brushing was negative for malignant cells. - 11/10/2020-CA 19.9-367. - ERCP/EUS by [name] on 11/30/2020 with 2.9 cm mass in the head of the pancreas with unclear outer borders.  Given significant dilatation, indistinct borders, unable to confidently assess major vascular involvement.  Abutment and probable invasion of SMV and portal vein near the confluence with splenic vein.  No obvious SMA or celiac artery involvement.  No other adenopathy. - FNA of the pancreatic mass consistent with adenocarcinoma. - Weight loss intentional 25 pounds between January and April 1.  Additional 25 pound weight loss since 1 April, unintentional. - CT [facility] pancreatic protocol on 12/05/2020 with ill-defined mass in the pancreatic head and uncinate process measuring up to 1.9 x 2.1 cm with associated pancreatic ductal dilatation up to 8 mm.  No enlarged adenopathy.  Teardrop morphology of SMV indicative of SMV involvement.  Replaced right hepatic arterial supply arises from the SMA passing immediately posterior to the tumor. - PET scan on 12/14/2020 with mass in the pancreatic head with hypermetabolic features, SUV 5.3 with no sign of metastatic disease. - Neoadjuvant FOLFIRINOX started on 01/10/2021.  2.  Social/family history: - He lives at home with his wife and is independent of all ADLs and IADLs. - He currently works in [employer].  No history of chemical exposure. - He previously smoked cigarettes.  Currently smokes cigars 4/day. - Maternal grandmother died at age 24 with malignancy, unknown type. - Sister died of acute leukemia.   PLAN:  1.  Stage I (T2N0) pancreatic adenocarcinoma: - He has tolerated last cycle reasonably well.  He had some episodes of diarrhea. - Reviewed labs today which showed elevated alk phos and albumin of 3.0.  Rest of the labs were within normal limits. - Last CA  19-9 was 996 on 03/14/2021. - CT [facility] pancreatic protocol showed pancreatic mass measuring 2.3 x 2.5 cm with indistinct margins.  Mass distorts the portal vein as before.  Proximally 180 degree involvement with soft tissue of the superior mesenteric artery.  Abutment of  replaced right hepatic artery at the origin of the vessel. - The findings are more or less stable without any evidence of metastatic disease. - I will hold off on his treatment today as we did not get any response. - I will reach out to Dr. Barry Dienes about her opinion on the scans and further management.  2.  Weight loss: - He has gained 3 pounds and has been eating better.  3.  Diabetes: - Continue Humalog 3 times daily with meals and Levemir at bedtime.  4.  Paraspinal back pain: - Continue Percocet as needed.   Orders placed this encounter:  No orders of the defined types were placed in this encounter.    Derek Jack, MD [facility] [phone]   I, Thana Ates, am acting as a scribe for Dr. Derek Jack.  I, Derek Jack MD, have reviewed the above documentation for accuracy and completeness, and I agree with the above.

## 2021-03-28 ENCOUNTER — Inpatient Hospital Stay (HOSPITAL_COMMUNITY): Payer: BC Managed Care – PPO | Attending: Hematology and Oncology

## 2021-03-28 ENCOUNTER — Inpatient Hospital Stay (HOSPITAL_COMMUNITY): Payer: BC Managed Care – PPO

## 2021-03-28 ENCOUNTER — Other Ambulatory Visit: Payer: Self-pay

## 2021-03-28 ENCOUNTER — Inpatient Hospital Stay (HOSPITAL_BASED_OUTPATIENT_CLINIC_OR_DEPARTMENT_OTHER): Payer: BC Managed Care – PPO | Admitting: Hematology

## 2021-03-28 ENCOUNTER — Encounter (HOSPITAL_COMMUNITY): Payer: Self-pay | Admitting: Hematology

## 2021-03-28 DIAGNOSIS — R634 Abnormal weight loss: Secondary | ICD-10-CM | POA: Insufficient documentation

## 2021-03-28 DIAGNOSIS — M5489 Other dorsalgia: Secondary | ICD-10-CM | POA: Insufficient documentation

## 2021-03-28 DIAGNOSIS — I129 Hypertensive chronic kidney disease with stage 1 through stage 4 chronic kidney disease, or unspecified chronic kidney disease: Secondary | ICD-10-CM | POA: Diagnosis not present

## 2021-03-28 DIAGNOSIS — F17210 Nicotine dependence, cigarettes, uncomplicated: Secondary | ICD-10-CM | POA: Diagnosis not present

## 2021-03-28 DIAGNOSIS — E785 Hyperlipidemia, unspecified: Secondary | ICD-10-CM | POA: Insufficient documentation

## 2021-03-28 DIAGNOSIS — N281 Cyst of kidney, acquired: Secondary | ICD-10-CM | POA: Diagnosis not present

## 2021-03-28 DIAGNOSIS — I719 Aortic aneurysm of unspecified site, without rupture: Secondary | ICD-10-CM | POA: Insufficient documentation

## 2021-03-28 DIAGNOSIS — R682 Dry mouth, unspecified: Secondary | ICD-10-CM | POA: Insufficient documentation

## 2021-03-28 DIAGNOSIS — Z806 Family history of leukemia: Secondary | ICD-10-CM | POA: Diagnosis not present

## 2021-03-28 DIAGNOSIS — C250 Malignant neoplasm of head of pancreas: Secondary | ICD-10-CM | POA: Insufficient documentation

## 2021-03-28 DIAGNOSIS — R748 Abnormal levels of other serum enzymes: Secondary | ICD-10-CM | POA: Insufficient documentation

## 2021-03-28 DIAGNOSIS — C259 Malignant neoplasm of pancreas, unspecified: Secondary | ICD-10-CM

## 2021-03-28 DIAGNOSIS — Z79899 Other long term (current) drug therapy: Secondary | ICD-10-CM | POA: Insufficient documentation

## 2021-03-28 DIAGNOSIS — Z8249 Family history of ischemic heart disease and other diseases of the circulatory system: Secondary | ICD-10-CM | POA: Insufficient documentation

## 2021-03-28 DIAGNOSIS — N1831 Chronic kidney disease, stage 3a: Secondary | ICD-10-CM | POA: Diagnosis not present

## 2021-03-28 DIAGNOSIS — I700 Atherosclerosis of aorta: Secondary | ICD-10-CM | POA: Insufficient documentation

## 2021-03-28 DIAGNOSIS — R5383 Other fatigue: Secondary | ICD-10-CM | POA: Insufficient documentation

## 2021-03-28 DIAGNOSIS — E1122 Type 2 diabetes mellitus with diabetic chronic kidney disease: Secondary | ICD-10-CM | POA: Insufficient documentation

## 2021-03-28 LAB — COMPREHENSIVE METABOLIC PANEL
ALT: 33 U/L (ref 0–44)
AST: 26 U/L (ref 15–41)
Albumin: 3 g/dL — ABNORMAL LOW (ref 3.5–5.0)
Alkaline Phosphatase: 253 U/L — ABNORMAL HIGH (ref 38–126)
Anion gap: 10 (ref 5–15)
BUN: 12 mg/dL (ref 8–23)
CO2: 21 mmol/L — ABNORMAL LOW (ref 22–32)
Calcium: 8.2 mg/dL — ABNORMAL LOW (ref 8.9–10.3)
Chloride: 106 mmol/L (ref 98–111)
Creatinine, Ser: 0.79 mg/dL (ref 0.61–1.24)
GFR, Estimated: >60 mL/min (ref >60)
Glucose, Bld: 145 mg/dL — ABNORMAL HIGH (ref 70–99)
Potassium: 3.8 mmol/L (ref 3.5–5.1)
Sodium: 137 mmol/L (ref 135–145)
Total Bilirubin: 0.3 mg/dL (ref 0.3–1.2)
Total Protein: 6.4 g/dL — ABNORMAL LOW (ref 6.5–8.1)

## 2021-03-28 LAB — CBC WITH DIFFERENTIAL/PLATELET
Abs Immature Granulocytes: 0.41 10*3/uL — ABNORMAL HIGH (ref 0.00–0.07)
Basophils Absolute: 0.1 10*3/uL (ref 0.0–0.1)
Basophils Relative: 1 %
Eosinophils Absolute: 0.1 10*3/uL (ref 0.0–0.5)
Eosinophils Relative: 1 %
HCT: 35.1 % — ABNORMAL LOW (ref 39.0–52.0)
Hemoglobin: 11.7 g/dL — ABNORMAL LOW (ref 13.0–17.0)
Immature Granulocytes: 3 %
Lymphocytes Relative: 11 %
Lymphs Abs: 1.7 10*3/uL (ref 0.7–4.0)
MCH: 31.4 pg (ref 26.0–34.0)
MCHC: 33.3 g/dL (ref 30.0–36.0)
MCV: 94.1 fL (ref 80.0–100.0)
Monocytes Absolute: 1.3 10*3/uL — ABNORMAL HIGH (ref 0.1–1.0)
Monocytes Relative: 8 %
Neutro Abs: 11.5 10*3/uL — ABNORMAL HIGH (ref 1.7–7.7)
Neutrophils Relative %: 76 %
Platelets: 324 10*3/uL (ref 150–400)
RBC: 3.73 MIL/uL — ABNORMAL LOW (ref 4.22–5.81)
RDW: 18.5 % — ABNORMAL HIGH (ref 11.5–15.5)
WBC: 15.1 10*3/uL — ABNORMAL HIGH (ref 4.0–10.5)
nRBC: 0 % (ref 0.0–0.2)

## 2021-03-28 LAB — MAGNESIUM: Magnesium: 1.8 mg/dL (ref 1.7–2.4)

## 2021-03-28 MED ORDER — SODIUM CHLORIDE 0.9% FLUSH
10.0000 mL | INTRAVENOUS | Status: DC | PRN
  Administered 2021-03-28: 10 mL via INTRAVENOUS

## 2021-03-28 MED ORDER — HEPARIN SOD (PORK) LOCK FLUSH 100 UNIT/ML IV SOLN
500.0000 [IU] | Freq: Once | INTRAVENOUS | Status: AC
  Administered 2021-03-28: 500 [IU] via INTRAVENOUS

## 2021-03-28 NOTE — Progress Notes (Signed)
No treatment today per MD orders.  Patients port flushed without difficulty.  Good blood return noted with no bruising or swelling noted at site.  Band aid applied.  VSS with discharge and left in satisfactory condition with no s/s of distress noted.

## 2021-03-28 NOTE — Patient Instructions (Addendum)
[place] at [facility] Discharge Instructions  You were seen and examined today by [name].  We will hold treatment today - we are waiting to hear from Dr. Barry Dienes about how to proceed with your treatment plan.   Follow up with Dr. Barry Dienes as planned.   Return as scheduled for lab work and office visit.    Thank you for choosing [place] at [facility] to provide your oncology and hematology care.  To afford each patient quality time with our provider, please arrive at least 15 minutes before your scheduled appointment time.   If you have a lab appointment with the [facility] please come in thru the Main Entrance and check in at the main information desk.  You need to re-schedule your appointment should you arrive 10 or more minutes late.  We strive to give you quality time with our providers, and arriving late affects you and other patients whose appointments are after yours.  Also, if you no show three or more times for appointments you may be dismissed from the clinic at the providers discretion.     Again, thank you for choosing [facility].  Our hope is that these requests will decrease the amount of time that you wait before being seen by our physicians.       _____________________________________________________________  Should you have questions after your visit to [facility], please contact our office at [phone] and follow the prompts.  Our office hours are 8:00 a.m. and 4:30 p.m. Monday - Friday.  Please note that voicemails left after 4:00 p.m. may not be returned until the following business day.  We are closed weekends and major holidays.  You do have access to a nurse 24-7, just call the main number to the clinic [phone] and do not press any options, hold on the line and a nurse will answer the phone.    For prescription refill requests, have your pharmacy contact our office and allow  72 hours.    Due to Covid, you will need to wear a mask upon entering the hospital. If you do not have a mask, a mask will be given to you at the Main Entrance upon arrival. For doctor visits, patients may have 1 support person age 32 or older with them. For treatment visits, patients can not have anyone with them due to social distancing guidelines and our immunocompromised population.

## 2021-03-28 NOTE — Progress Notes (Signed)
Patients port flushed without difficulty.  Good blood return noted with no bruising or swelling noted at site.  Stable during access and blood draw.  Patient to remain accessed for treatment. 

## 2021-03-28 NOTE — Patient Instructions (Signed)
[facility]  Discharge Instructions: Thank you for choosing [facility] to provide your oncology and hematology care.  If you have a lab appointment with the Cancer Center, please come in thru the Main Entrance and check in at the main information desk.  Wear comfortable clothing and clothing appropriate for easy access to any Portacath or PICC line.   We strive to give you quality time with your provider. You may need to reschedule your appointment if you arrive late (15 or more minutes).  Arriving late affects you and other patients whose appointments are after yours.  Also, if you miss three or more appointments without notifying the office, you may be dismissed from the clinic at the provider's discretion.      For prescription refill requests, have your pharmacy contact our office and allow 72 hours for refills to be completed.        To help prevent nausea and vomiting after your treatment, we encourage you to take your nausea medication as directed.  BELOW ARE SYMPTOMS THAT SHOULD BE REPORTED IMMEDIATELY: *FEVER GREATER THAN 100.4 F (38 C) OR HIGHER *CHILLS OR SWEATING *NAUSEA AND VOMITING THAT IS NOT CONTROLLED WITH YOUR NAUSEA MEDICATION *UNUSUAL SHORTNESS OF BREATH *UNUSUAL BRUISING OR BLEEDING *URINARY PROBLEMS (pain or burning when urinating, or frequent urination) *BOWEL PROBLEMS (unusual diarrhea, constipation, pain near the anus) TENDERNESS IN MOUTH AND THROAT WITH OR WITHOUT PRESENCE OF ULCERS (sore throat, sores in mouth, or a toothache) UNUSUAL RASH, SWELLING OR PAIN  UNUSUAL VAGINAL DISCHARGE OR ITCHING   Items with * indicate a potential emergency and should be followed up as soon as possible or go to the Emergency Department if any problems should occur.  Please show the CHEMOTHERAPY ALERT CARD or IMMUNOTHERAPY ALERT CARD at check-in to the Emergency Department and triage nurse.  Should you have questions after your visit or need to cancel  or reschedule your appointment, please contact [facility] [phone]  and follow the prompts.  Office hours are 8:00 a.m. to 4:30 p.m. Monday - Friday. Please note that voicemails left after 4:00 p.m. may not be returned until the following business day.  We are closed weekends and major holidays. You have access to a nurse at all times for urgent questions. Please call the main number to the clinic [phone] and follow the prompts.  For any non-urgent questions, you may also contact your provider using MyChart. We now offer e-Visits for anyone 18 and older to request care online for non-urgent symptoms. For details visit [email or web address].   Also download the MyChart app! Go to the app store, search "MyChart", open the app, select [facility], and log in with your MyChart username and password.  Due to Covid, a mask is required upon entering the hospital/clinic. If you do not have a mask, one will be given to you upon arrival. For doctor visits, patients may have 1 support person aged 18 or older with them. For treatment visits, patients cannot have anyone with them due to current Covid guidelines and our immunocompromised population.  

## 2021-03-29 ENCOUNTER — Ambulatory Visit (HOSPITAL_BASED_OUTPATIENT_CLINIC_OR_DEPARTMENT_OTHER): Payer: BC Managed Care – PPO | Admitting: Genetic Counselor

## 2021-03-29 ENCOUNTER — Other Ambulatory Visit (HOSPITAL_COMMUNITY): Payer: Self-pay

## 2021-03-29 DIAGNOSIS — C259 Malignant neoplasm of pancreas, unspecified: Secondary | ICD-10-CM

## 2021-03-29 NOTE — Progress Notes (Signed)
REFERRING PROVIDER: Derek Jack, MD  PRIMARY PROVIDER:  [name], MD  PRIMARY REASON FOR VISIT:  Encounter Diagnosis  Name Primary?   Pancreatic adenocarcinoma ([place]) Yes    HISTORY OF PRESENT ILLNESS:   Mr. Rosete, a 65 y.o. male, was seen for a [place] cancer genetics consultation at the request of [name] due to a personal history of cancer.  Mr. Nippert presents to clinic today to discuss the possibility of a hereditary predisposition to cancer, to discuss genetic testing, and to further clarify his future cancer risks, as well as potential cancer risks for family members.   In July 2022, at the age of 30, Mr. Caliendo was diagnosed with pancreatic adenocarcinoma.    CANCER HISTORY:  Oncology History  Pancreatic adenocarcinoma ([place])  12/04/2020 Initial Diagnosis   Pancreatic adenocarcinoma ([place])   01/10/2021 -  Chemotherapy   Patient is on Treatment Plan : PANCREAS Modified FOLFIRINOX q14d x 4 cycles      Past Medical History:  Diagnosis Date   Anemia, iron deficiency    Diabetes mellitus without complication ([place])    on meds   Hearing loss    Bil hearing aids   Hyperlipidemia    Hypertension    Port-A-Cath in place 12/26/2020   Prediabetes    Sleep apnea     Past Surgical History:  Procedure Laterality Date   BILIARY STENT PLACEMENT N/A 11/09/2020   Procedure: BILIARY STENT PLACEMENT - 34 F X 5 CM STENT;  Surgeon: Rogene Houston, MD;  Location: [facility];  Service: Endoscopy;  Laterality: N/A;   BILIARY STENT PLACEMENT N/A 12/27/2020   Procedure: BILIARY STENT PLACEMENT;  Surgeon: Rogene Houston, MD;  Location: [facility];  Service: Endoscopy;  Laterality: N/A;   COLONOSCOPY     ERCP N/A 11/09/2020   Procedure: ENDOSCOPIC RETROGRADE CHOLANGIOPANCREATOGRAPHY (ERCP);  Surgeon: Rogene Houston, MD;  Location: [facility];  Service: Endoscopy;  Laterality: N/A;   ERCP N/A 12/27/2020   Procedure: ENDOSCOPIC RETROGRADE CHOLANGIOPANCREATOGRAPHY (ERCP);   Surgeon: Rogene Houston, MD;  Location: [facility];  Service: Endoscopy;  Laterality: N/A;   ESOPHAGOGASTRODUODENOSCOPY (EGD) WITH PROPOFOL N/A 11/30/2020   Procedure: ESOPHAGOGASTRODUODENOSCOPY (EGD) WITH PROPOFOL;  Surgeon: [place] Banister, MD;  Location: [facility];  Service: Endoscopy;  Laterality: N/A;   EUS N/A 11/30/2020   Procedure: UPPER ENDOSCOPIC ULTRASOUND (EUS) RADIAL;  Surgeon: [place]us Banister, MD;  Location: [facility];  Service: Endoscopy;  Laterality: N/A;   FINE NEEDLE ASPIRATION N/A 11/30/2020   Procedure: FINE NEEDLE ASPIRATION (FNA) LINEAR;  Surgeon: [place]us Banister, MD;  Location: [facility];  Service: Endoscopy;  Laterality: N/A;   GIVENS CAPSULE STUDY     IR IMAGING GUIDED PORT INSERTION  12/18/2020   KNEE SURGERY     4 surgeries on left, 2 on right knee   SHOULDER SURGERY     right shoulder   SPHINCTEROTOMY N/A 11/09/2020   Procedure: SHORT BILIARY SPHINCTEROTOMY;  Surgeon: Rogene Houston, MD;  Location: [facility];  Service: Endoscopy;  Laterality: N/A;    Social History   Socioeconomic History   Marital status: Married    Spouse name: Not on file   Number of children: Not on file   Years of education: Not on file   Highest education level: Not on file  Occupational History   Not on file  Tobacco Use   Smoking status: Every Day    Types: Cigars   Smokeless tobacco: Never   Tobacco comments:  Smoke 3-4 cigars a day  Vaping Use   Vaping Use: Never used  Substance and Sexual Activity   Alcohol use: Yes    Comment: occasional   Drug use: Never   Sexual activity: Not on file  Other Topics Concern   Not on file  Social History Narrative   Not on file   Social Determinants of Health   Financial Resource Strain: Low Risk    Difficulty of Paying Living Expenses: Not hard at all  Food Insecurity: No Food Insecurity   Worried About Charity fundraiser in the Last Year: Never true   Speed in the Last Year: Never true  Transportation Needs:  No Transportation Needs   Lack of Transportation (Medical): No   Lack of Transportation (Non-Medical): No  Physical Activity: Sufficiently Active   Days of Exercise per Week: 5 days   Minutes of Exercise per Session: 30 min  Stress: Not on file  Social Connections: Not on file     FAMILY HISTORY:  We obtained a detailed, 4-generation family history.  Significant diagnoses are listed below:    Mr. Corbit sister was diagnosed with leukemia at an unknown age, she is deceased. His maternal grandmother was diagnosed with an unknown cancer type in her 82s/80s, she is deceased. Mr. Casale is unaware of previous family history of genetic testing for hereditary cancer risks.   GENETIC COUNSELING ASSESSMENT: Mr. Meader is a 65 y.o. male with a personal history of cancer which is somewhat suggestive of a hereditary predisposition to cancer. We, therefore, discussed and recommended the following at today's visit.   DISCUSSION: We discussed that 5 - 10% of cancer is hereditary. However, 20% of pancreatic cancer is hereditary with most cases of hereditary pancreatic cancer associated with BRCA1/2.  There are other genes that can be associated with hereditary pancreatic cancer syndromes.  We discussed that testing is beneficial for several reasons including knowing how to follow individuals after completing their treatment, identifying whether potential treatment options would be beneficial, and understanding if other family members could be at risk for cancer and allowing them to undergo genetic testing.   We reviewed the characteristics, features and inheritance patterns of hereditary cancer syndromes. We also discussed genetic testing, including the appropriate family members to test, the process of testing, insurance coverage and turn-around-time for results. We discussed the implications of a negative, positive, carrier and/or variant of uncertain significant result. We recommended Mr. Marietta pursue  genetic testing for a panel that includes genes associated with pancreatic cancer.   Mr. Dishman  was offered a common hereditary cancer panel (47 genes) and an expanded pan-cancer panel (84 genes). Mr. Amis was informed of the benefits and limitations of each panel, including that expanded pan-cancer panels contain genes that do not have clear management guidelines at this point in time.  We also discussed that as the number of genes included on a panel increases, the chances of variants of uncertain significance increases.  After considering the benefits and limitations of each gene panel, Mr. Ballen elected to have [place] (77 genes).  The CancerNext-Expanded gene panel offered by [facility] and includes sequencing, rearrangement, and RNA analysis for the following 77 genes: AIP, ALK, APC, ATM, AXIN2, BAP1, BARD1, BLM, BMPR1A, BRCA1, BRCA2, BRIP1, CDC73, CDH1, CDK4, CDKN1B, CDKN2A, CHEK2, CTNNA1, DICER1, FANCC, FH, FLCN, GALNT12, KIF1B, LZTR1, MAX, MEN1, MET, MLH1, MSH2, MSH3, MSH6, MUTYH, NBN, NF1, NF2, NTHL1, PALB2, PHOX2B, PMS2, POT1, PRKAR1A, PTCH1, PTEN, RAD51C, RAD51D,  RB1, RECQL, RET, SDHA, SDHAF2, SDHB, SDHC, SDHD, SMAD4, SMARCA4, SMARCB1, SMARCE1, STK11, SUFU, TMEM127, TP53, TSC1, TSC2, VHL and XRCC2 (sequencing and deletion/duplication); EGFR, EGLN1, HOXB13, KIT, MITF, PDGFRA, POLD1, and POLE (sequencing only); EPCAM and GREM1 (deletion/duplication only).   Based on Mr. Rueb personal history of cancer, he meets medical criteria for genetic testing. Despite that he meets criteria, he may still have an out of pocket cost. We discussed that if his out of pocket cost for testing is over $100, the laboratory will call and confirm whether he wants to proceed with testing.  If the out of pocket cost of testing is less than $100 he will be billed by the genetic testing laboratory.   PLAN: After considering the risks, benefits, and limitations, Mr. Macke provided informed  consent to pursue genetic testing and the blood sample was sent to [facility] for analysis of the CancerNext-Expanded Panel (77 genes). Results should be available within approximately 2-3 weeks' time, at which point they will be disclosed by telephone to Mr. Prout, as will any additional recommendations warranted by these results. Mr. Weese will receive a summary of his genetic counseling visit and a copy of his results once available. This information will also be available in Epic.   Mr. Mccombie questions were answered to his satisfaction today. Our contact information was provided should additional questions or concerns arise. Thank you for the referral and allowing [place] to share in the care of your patient.   Lucille Passy, MS, [facility] Genetic Counselor [email or web address] (P) [phone]  The patient was seen for a total of 20 minutes in video genetic counseling.  The patient was seen alone.  Drs. Magrinat, Lindi Adie and/or Burr Medico were available to discuss this case as needed.   _______________________________________________________________________ For Office Staff:  Number of people involved in session: 1 Was an Intern/ student involved with case: no

## 2021-03-29 NOTE — Progress Notes (Signed)
Patient to have genetic screening kit ([facility]) drawn on 03/30/21 at 1120. Kit taken to outpatient lab.

## 2021-03-30 ENCOUNTER — Inpatient Hospital Stay (HOSPITAL_COMMUNITY): Payer: BC Managed Care – PPO

## 2021-03-30 ENCOUNTER — Encounter (HOSPITAL_COMMUNITY): Payer: BC Managed Care – PPO

## 2021-03-30 ENCOUNTER — Encounter (HOSPITAL_COMMUNITY): Payer: Self-pay

## 2021-03-30 ENCOUNTER — Encounter (HOSPITAL_COMMUNITY): Payer: Self-pay | Admitting: Hematology

## 2021-03-30 ENCOUNTER — Other Ambulatory Visit: Payer: Self-pay

## 2021-03-30 DIAGNOSIS — C259 Malignant neoplasm of pancreas, unspecified: Secondary | ICD-10-CM

## 2021-03-30 DIAGNOSIS — Z8500 Personal history of malignant neoplasm of unspecified digestive organ: Secondary | ICD-10-CM | POA: Diagnosis not present

## 2021-03-30 LAB — GENETIC SCREENING ORDER

## 2021-04-03 DIAGNOSIS — C250 Malignant neoplasm of head of pancreas: Secondary | ICD-10-CM | POA: Diagnosis not present

## 2021-04-03 LAB — CANCER ANTIGEN 19-9: CA 19-9: 592 U/mL — ABNORMAL HIGH (ref 0–35)

## 2021-04-05 ENCOUNTER — Ambulatory Visit: Payer: BC Managed Care – PPO | Admitting: Nutrition

## 2021-04-05 ENCOUNTER — Ambulatory Visit: Payer: BC Managed Care – PPO | Admitting: "Endocrinology

## 2021-04-09 ENCOUNTER — Other Ambulatory Visit: Payer: Self-pay

## 2021-04-09 ENCOUNTER — Encounter: Payer: Self-pay | Admitting: "Endocrinology

## 2021-04-09 ENCOUNTER — Ambulatory Visit (INDEPENDENT_AMBULATORY_CARE_PROVIDER_SITE_OTHER): Payer: BC Managed Care – PPO | Admitting: "Endocrinology

## 2021-04-09 ENCOUNTER — Encounter: Payer: BC Managed Care – PPO | Attending: "Endocrinology | Admitting: Nutrition

## 2021-04-09 ENCOUNTER — Encounter: Payer: Self-pay | Admitting: Nutrition

## 2021-04-09 ENCOUNTER — Ambulatory Visit (HOSPITAL_BASED_OUTPATIENT_CLINIC_OR_DEPARTMENT_OTHER): Payer: BC Managed Care – PPO

## 2021-04-09 VITALS — BP 121/79 | HR 102 | Ht 75.0 in | Wt 194.4 lb

## 2021-04-09 VITALS — Ht 75.0 in | Wt 194.0 lb

## 2021-04-09 DIAGNOSIS — Z794 Long term (current) use of insulin: Secondary | ICD-10-CM

## 2021-04-09 DIAGNOSIS — E1122 Type 2 diabetes mellitus with diabetic chronic kidney disease: Secondary | ICD-10-CM

## 2021-04-09 DIAGNOSIS — E118 Type 2 diabetes mellitus with unspecified complications: Secondary | ICD-10-CM | POA: Diagnosis not present

## 2021-04-09 DIAGNOSIS — E039 Hypothyroidism, unspecified: Secondary | ICD-10-CM | POA: Diagnosis not present

## 2021-04-09 DIAGNOSIS — N1831 Chronic kidney disease, stage 3a: Secondary | ICD-10-CM

## 2021-04-09 DIAGNOSIS — C259 Malignant neoplasm of pancreas, unspecified: Secondary | ICD-10-CM | POA: Insufficient documentation

## 2021-04-09 DIAGNOSIS — I10 Essential (primary) hypertension: Secondary | ICD-10-CM | POA: Insufficient documentation

## 2021-04-09 DIAGNOSIS — E782 Mixed hyperlipidemia: Secondary | ICD-10-CM | POA: Insufficient documentation

## 2021-04-09 LAB — POCT GLYCOSYLATED HEMOGLOBIN (HGB A1C): HbA1c, POC (controlled diabetic range): 6.8 % (ref 0.0–7.0)

## 2021-04-09 MED ORDER — INSULIN DETEMIR 100 UNIT/ML FLEXPEN: 30.0000 [IU] | PEN_INJECTOR | Freq: Every day | SUBCUTANEOUS | 2 refills | Status: DC

## 2021-04-09 MED ORDER — FREESTYLE LIBRE 2 SENSOR MISC: 1.0000 | 2 refills | Status: DC

## 2021-04-09 MED ORDER — INSULIN LISPRO (1 UNIT DIAL) 100 UNIT/ML (KWIKPEN): 8.0000 [IU] | PEN_INJECTOR | Freq: Three times a day (TID) | SUBCUTANEOUS | 2 refills | Status: DC

## 2021-04-09 NOTE — Progress Notes (Signed)
Medical Nutrition Therapy Follow upo Appointment Start time:  [phone]  Appointment End time:  1000  Primary concerns today:  DM Type 2,   Referral diagnosis: E11.8 Preferred learning style:no preference indicated Learning readiness: Change in progress   NUTRITION ASSESSMENT  Saw Dr. Dorris Fetch today. A1C down to 6.8%. He is here with his wife. Gained 3 lbs He has finished taking chemo. Scheduled to have surgery of a whipple procedure in the future. Schedule to have some surgery on his pancreatic mass in [place].  Appetite some better. Enjoys peanut butter. Has been taking insulin as prescribed. Had to reduce amount due to lower blood sugars. Dr. Dorris Fetch reduced his [place] to  30 units and also Novolog down to 8 units  with meals plus sliding scale with meals. Has more energy. Feels much better. Appetite and taste buds coming back. Has the [place] CGM. TIR 83%, 4% low blood sugar 13% high  Anthropometrics  CMP Latest Ref Rng & Units 03/28/2021 03/14/2021 02/28/2021  Glucose 70 - 99 mg/dL 145(H) 172(H) 157(H)  BUN 8 - 23 mg/dL 12 11 14   Creatinine 0.61 - 1.24 mg/dL 0.79 0.81 0.77  Sodium 135 - 145 mmol/L 137 135 136  Potassium 3.5 - 5.1 mmol/L 3.8 3.8 3.9  Chloride 98 - 111 mmol/L 106 107 106  CO2 22 - 32 mmol/L 21(L) 21(L) 23  Calcium 8.9 - 10.3 mg/dL 8.2(L) 8.2(L) 7.8(L)  Total Protein 6.5 - 8.1 g/dL 6.4(L) 6.5 5.9(L)  Total Bilirubin 0.3 - 1.2 mg/dL 0.3 0.4 0.5  Alkaline Phos 38 - 126 U/L 253(H) 194(H) 164(H)  AST 15 - 41 U/L 26 24 22   ALT 0 - 44 U/L 33 27 21   Wt Readings from Last 3 Encounters:  04/09/21 194 lb 6.4 oz (88.2 kg)  03/28/21 191 lb 3.2 oz (86.7 kg)  03/14/21 189 lb (85.7 kg)   Ht Readings from Last 3 Encounters:  04/09/21 6\' 3"  (1.905 m)  01/16/21 6' 2.5" (1.892 m)  01/05/21 6' 2.5" (1.892 m)   There is no height or weight on file to calculate BMI. @BMIFA @ Facility age limit for growth percentiles is 20 years. Facility age limit for growth percentiles is 20  years. CMP Latest Ref Rng & Units 03/28/2021 03/14/2021 02/28/2021  Glucose 70 - 99 mg/dL 145(H) 172(H) 157(H)  BUN 8 - 23 mg/dL 12 11 14   Creatinine 0.61 - 1.24 mg/dL 0.79 0.81 0.77  Sodium 135 - 145 mmol/L 137 135 136  Potassium 3.5 - 5.1 mmol/L 3.8 3.8 3.9  Chloride 98 - 111 mmol/L 106 107 106  CO2 22 - 32 mmol/L 21(L) 21(L) 23  Calcium 8.9 - 10.3 mg/dL 8.2(L) 8.2(L) 7.8(L)  Total Protein 6.5 - 8.1 g/dL 6.4(L) 6.5 5.9(L)  Total Bilirubin 0.3 - 1.2 mg/dL 0.3 0.4 0.5  Alkaline Phos 38 - 126 U/L 253(H) 194(H) 164(H)  AST 15 - 41 U/L 26 24 22   ALT 0 - 44 U/L 33 27 21      Clinical Medical Hx: Pancreatic Cancer, DM Type 2 Medications:  Labs:  Lab Results  Component Value Date   HGBA1C 6.8 04/09/2021    Notable Signs/Symptoms: fatigue, thirst, dry mouth, no appetite  Lifestyle & Dietary Hx LIves with his wife. Got dx with pancreatic cancer a few weeks ago. Just got over an infection and bile duct issues with sepsis.  Estimated daily fluid intake: 60 oz Supplements:  Sleep: varies  Stress / self-care: his health and diagnosis Current average weekly physical activity:  ADL  24-Hr Dietary Recall First Meal: Special K with toast with pB and jelly, or sausage biscuit Snack: none Second Meal: 2 banana sandwiches, Diet Mt Dew. water Snack: none Third Meal: 2 slices pizza, cashews, and 2 bread sticks, orange candy slices 4 Snack: none  Beverages: Water  Estimated Energy Needs Calories: 2200 Carbohydrate: 248g Protein: 165g Fat: 61g   NUTRITION DIAGNOSIS  NB-1.1 Food and nutrition-related knowledge deficit As related to Diabetes Type 2.  As evidenced by A1C > 12% with new diagnosis of pancreatic cancer.   NUTRITION INTERVENTION  Nutrition education (E-1) on the following topics:  Lifestyle Medicine - Whole Food, Plant Predominant Nutrition is highly recommended: Eat Plenty of vegetables, Mushrooms, fruits, Legumes, Whole Grains, Nuts, seeds in lieu of processed meats,  processed snacks/pastries red meat, poultry, eggs.    -It is better to avoid simple carbohydrates including: Cakes, Sweet Desserts, Ice Cream, Soda (diet and regular), Sweet Tea, Candies, Chips, Cookies, Store Bought Juices, Alcohol in Excess of  1-2 drinks a day, Lemonade,  Artificial Sweeteners, Doughnuts, Coffee Creamers, "Sugar-free" Products, etc, etc.  This is not a complete list.....  Exercise: If you are able: 30 -60 minutes a day ,4 days a week, or 150 minutes a week.  The longer the better.  Combine stretch, strength, and aerobic activities.  If you were told in the past that you have high risk for cardiovascular diseases, you may seek evaluation by your heart doctor prior to initiating moderate to intense exercise programs.    Learning Style & Readiness for Change Teaching method utilized: Visual & Auditory  Demonstrated degree of understanding via: Teach Back  Barriers to learning/adherence to lifestyle change: Cancer and chemo therapy  Goals Established by Pt Goals Keep up the great job. Eat 60-75 g CHO at each meals Focus on more plant based foods, see plant based handout. Check out [employer] of Lifestyle medicine website and [place] for more information and recipes. Focus on nutrient dense foods and eat what you can. Medications can be adjusted. Avoid simple sugars   MONITORING & EVALUATION Dietary intake, weekly physical activity, and blood sugar in 1 month.  Next Steps  Patient is to focus on increasing calories with nutrient dense foods.Marland Kitchen

## 2021-04-09 NOTE — Patient Instructions (Signed)

## 2021-04-09 NOTE — Progress Notes (Signed)
04/09/2021, 12:41 PM  Endocrinology follow-up note   Subjective:    Patient ID: Alexander Banks, male    DOB: 02-26-1956.  Alexander Banks is being seen in follow-up after he was seen in consultation for management of currently uncontrolled symptomatic diabetes requested by  [name], MD.   Past Medical History:  Diagnosis Date   Anemia, iron deficiency    Diabetes mellitus without complication ([place])    on meds   Hearing loss    Bil hearing aids   Hyperlipidemia    Hypertension    Port-A-Cath in place 12/26/2020   Prediabetes    Sleep apnea     Past Surgical History:  Procedure Laterality Date   BILIARY STENT PLACEMENT N/A 11/09/2020   Procedure: BILIARY STENT PLACEMENT - 10 F X 5 CM STENT;  Surgeon: Rogene Houston, MD;  Location: [facility];  Service: Endoscopy;  Laterality: N/A;   BILIARY STENT PLACEMENT N/A 12/27/2020   Procedure: BILIARY STENT PLACEMENT;  Surgeon: Rogene Houston, MD;  Location: [facility];  Service: Endoscopy;  Laterality: N/A;   COLONOSCOPY     ERCP N/A 11/09/2020   Procedure: ENDOSCOPIC RETROGRADE CHOLANGIOPANCREATOGRAPHY (ERCP);  Surgeon: Rogene Houston, MD;  Location: [facility];  Service: Endoscopy;  Laterality: N/A;   ERCP N/A 12/27/2020   Procedure: ENDOSCOPIC RETROGRADE CHOLANGIOPANCREATOGRAPHY (ERCP);  Surgeon: Rogene Houston, MD;  Location: [facility];  Service: Endoscopy;  Laterality: N/A;   ESOPHAGOGASTRODUODENOSCOPY (EGD) WITH PROPOFOL N/A 11/30/2020   Procedure: ESOPHAGOGASTRODUODENOSCOPY (EGD) WITH PROPOFOL;  Surgeon: Milus Banister, MD;  Location: [facility];  Service: Endoscopy;  Laterality: N/A;   EUS N/A 11/30/2020   Procedure: UPPER ENDOSCOPIC ULTRASOUND (EUS) RADIAL;  Surgeon: Milus Banister, MD;  Location: [facility];  Service: Endoscopy;  Laterality: N/A;   FINE NEEDLE ASPIRATION N/A 11/30/2020   Procedure: FINE NEEDLE ASPIRATION (FNA) LINEAR;   Surgeon: Milus Banister, MD;  Location: [facility];  Service: Endoscopy;  Laterality: N/A;   GIVENS CAPSULE STUDY     IR IMAGING GUIDED PORT INSERTION  12/18/2020   KNEE SURGERY     4 surgeries on left, 2 on right knee   SHOULDER SURGERY     right shoulder   SPHINCTEROTOMY N/A 11/09/2020   Procedure: SHORT BILIARY SPHINCTEROTOMY;  Surgeon: Rogene Houston, MD;  Location: [facility];  Service: Endoscopy;  Laterality: N/A;    Social History   Socioeconomic History   Marital status: Married    Spouse name: Not on file   Number of children: Not on file   Years of education: Not on file   Highest education level: Not on file  Occupational History   Not on file  Tobacco Use   Smoking status: Every Day    Types: Cigars   Smokeless tobacco: Never   Tobacco comments:    Smoke 3-4 cigars a day  Vaping Use   Vaping Use: Never used  Substance and Sexual Activity   Alcohol use: Yes    Comment: occasional   Drug use: Never   Sexual activity: Not on file  Other Topics Concern  Not on file  Social History Narrative   Not on file   Social Determinants of Health   Financial Resource Strain: Low Risk    Difficulty of Paying Living Expenses: Not hard at all  Food Insecurity: No Food Insecurity   Worried About Charity fundraiser in the Last Year: Never true   Arboriculturist in the Last Year: Never true  Transportation Needs: No Transportation Needs   Lack of Transportation (Medical): No   Lack of Transportation (Non-Medical): No  Physical Activity: Sufficiently Active   Days of Exercise per Week: 5 days   Minutes of Exercise per Session: 30 min  Stress: Not on file  Social Connections: Not on file    Family History  Problem Relation Age of Onset   Heart attack Father    Heart attack Paternal Grandfather    Leukemia Sister    Heart attack Brother     Outpatient Encounter Medications as of 04/09/2021  Medication Sig   Continuous Blood Gluc Receiver (FREESTYLE LIBRE 2  READER) DEVI As directed   Continuous Blood Gluc Sensor (FREESTYLE LIBRE 2 SENSOR) MISC 1 Piece by Does not apply route every 14 (fourteen) days.   fluorouracil CALGB 73220 2,400 mg/m2 in sodium chloride 0.9 % 150 mL Inject 2,400 mg/m2 into the vein over 48 hr. Every 14 days   Glucosamine-Chondroitin (OSTEO BI-FLEX REGULAR STRENGTH PO) Take 2 tablets by mouth daily.   insulin detemir (LEVEMIR) 100 UNIT/ML FlexPen Inject 30 Units into the skin at bedtime.   insulin lispro (HUMALOG KWIKPEN) 100 UNIT/ML KwikPen Inject 8-12 Units into the skin 3 (three) times daily before meals.   IRINOTECAN HCL IV Inject 150 mg/m2 into the vein every 14 (fourteen) days.   IRON-VITAMIN C PO Take 1 tablet by mouth daily.   LEUCOVORIN CALCIUM IV Inject 400 mg/m2 into the vein every 14 (fourteen) days.   levothyroxine (SYNTHROID) 175 MCG tablet TAKE 1 TABLET DAILY (NEEDS VISIT IN [place])   lidocaine-prilocaine (EMLA) cream Apply a small amount to port a cath site (do not rub in) and cover with plastic wrap 1 hour prior to infusion appointments   megestrol (MEGACE) 400 MG/10ML suspension Take 10 mLs (400 mg total) by mouth 2 (two) times daily.   Melatonin-Pyridoxine (MELATIN PO) Take by mouth. Takes 2 qhs   ondansetron (ZOFRAN) 8 MG tablet Take 1 tablet (8 mg total) by mouth 2 (two) times daily as needed. Start on day 3 after chemotherapy.   OXALIPLATIN IV Inject 85 mg/m2 into the vein every 14 (fourteen) days.   oxyCODONE-acetaminophen (PERCOCET/ROXICET) 5-325 MG tablet Take 1 tablet by mouth every 6 (six) hours as needed for severe pain.   prochlorperazine (COMPAZINE) 10 MG tablet Take 10 mg by mouth every 6 (six) hours as needed for nausea or vomiting.   [DISCONTINUED] Continuous Blood Gluc Sensor (FREESTYLE LIBRE 2 SENSOR) MISC 1 Piece by Does not apply route every 14 (fourteen) days.   [DISCONTINUED] insulin detemir (LEVEMIR) 100 UNIT/ML FlexPen Inject 35 Units into the skin at bedtime. (Patient taking differently:  Inject 30 Units into the skin at bedtime.)   [DISCONTINUED] insulin lispro (HUMALOG KWIKPEN) 100 UNIT/ML KwikPen Inject 10-16 Units into the skin 3 (three) times daily before meals. (Patient taking differently: Inject 8-12 Units into the skin 3 (three) times daily before meals.)   Facility-Administered Encounter Medications as of 04/09/2021  Medication   alteplase (CATHFLO ACTIVASE) injection 2 mg   heparin lock flush 100 unit/mL   sodium chloride  flush (NS) 0.9 % injection 10 mL   sodium chloride flush (NS) 0.9 % injection 3 mL    ALLERGIES: Allergies  Allergen Reactions   Lisinopril Cough    VACCINATION STATUS: Immunization History  Administered Date(s) Administered   Influenza,inj,Quad PF,6+ Mos 04/15/2018, 04/09/2019, 02/28/2021   Pneumococcal Polysaccharide-23 08/10/2013   Td 06/18/2002, 11/16/2009      Hyperlipidemia This is a chronic problem. The current episode started more than 1 year ago. He has tried statins for the symptoms.  Diabetes He presents for his follow-up diabetic visit. He has type 2 diabetes mellitus. His disease course has been improving. There are no hypoglycemic associated symptoms. Pertinent negatives for hypoglycemia include no confusion, pallor or seizures. Associated symptoms include polydipsia and polyuria. Pertinent negatives for diabetes include no polyphagia. There are no hypoglycemic complications. Risk factors for coronary artery disease include diabetes mellitus, dyslipidemia and male sex. Current diabetic treatment includes insulin injections. His weight is fluctuating minimally. He is following a generally unhealthy diet. He has had a previous visit with a dietitian. His home blood glucose trend is decreasing steadily. His breakfast blood glucose range is generally 110-130 mg/dl. His lunch blood glucose range is generally 130-140 mg/dl. His dinner blood glucose range is generally 130-140 mg/dl. His bedtime blood glucose range is generally 130-140  mg/dl. His overall blood glucose range is 130-140 mg/dl. (He is responding to the MDI.  He presents with CGM device.  His AGP reports 83% time range, 13% above range.  0% hypoglycemia.  His average blood glucose is 130 mg/day.  His point-of-care A1c 6.8% improving from 12.8% overall.   )    Review of Systems  Constitutional:  Negative for unexpected weight change.  HENT:  Negative for dental problem, mouth sores and trouble swallowing.   Eyes:  Negative for visual disturbance.  Respiratory:  Negative for choking, chest tightness, shortness of breath and wheezing.   Cardiovascular:  Negative for palpitations and leg swelling.  Gastrointestinal:  Negative for abdominal distention, constipation and diarrhea.  Endocrine: Positive for polydipsia and polyuria. Negative for polyphagia.  Genitourinary:  Negative for dysuria, flank pain, hematuria and urgency.  Musculoskeletal:  Negative for back pain and gait problem.  Skin:  Negative for pallor and wound.  Neurological:  Negative for seizures and syncope.  Psychiatric/Behavioral:  Negative for confusion and dysphoric mood.    Objective:    Vitals with BMI 04/09/2021 04/09/2021 03/28/2021  Height 6\' 3"  6\' 3"  -  Weight 194 lbs 194 lbs 6 oz 191 lbs 3 oz  BMI 52.84 13.2 -  Systolic - 440 102  Diastolic - 79 94  Pulse - 102 96    BP 121/79   Pulse (!) 102   Ht 6\' 3"  (1.905 m)   Wt 194 lb 6.4 oz (88.2 kg)   BMI 24.30 kg/m   Wt Readings from Last 3 Encounters:  04/09/21 194 lb (88 kg)  04/09/21 194 lb 6.4 oz (88.2 kg)  03/28/21 191 lb 3.2 oz (86.7 kg)     Physical Exam Constitutional:      General: He is not in acute distress.    Appearance: He is well-developed.  HENT:     Head: Normocephalic and atraumatic.  Neck:     Thyroid: No thyromegaly.     Trachea: No tracheal deviation.  Cardiovascular:     Rate and Rhythm: Normal rate.     Pulses:          Dorsalis pedis pulses are 1+ on the  right side and 1+ on the left side.        Posterior tibial pulses are 1+ on the right side and 1+ on the left side.     Heart sounds: S1 normal and S2 normal. No murmur heard.   No gallop.  Pulmonary:     Effort: Pulmonary effort is normal. No respiratory distress.     Breath sounds: No wheezing.  Abdominal:     General: There is no distension.     Tenderness: There is no abdominal tenderness. There is no guarding.  Musculoskeletal:     Right shoulder: No swelling or deformity.     Cervical back: Normal range of motion and neck supple.  Skin:    General: Skin is warm and dry.     Findings: No rash.     Nails: There is no clubbing.  Neurological:     Mental Status: He is alert and oriented to person, place, and time.     Cranial Nerves: No cranial nerve deficit.     Sensory: No sensory deficit.     Gait: Gait normal.     Deep Tendon Reflexes: Reflexes are normal and symmetric.  Psychiatric:        Speech: Speech normal.        Behavior: Behavior normal. Behavior is cooperative.        Thought Content: Thought content normal.        Judgment: Judgment normal.    CMP ( most recent) CMP     Component Value Date/Time   NA 137 03/28/2021 0809   NA 143 12/22/2020 0808   K 3.8 03/28/2021 0809   CL 106 03/28/2021 0809   CO2 21 (L) 03/28/2021 0809   GLUCOSE 145 (H) 03/28/2021 0809   BUN 12 03/28/2021 0809   BUN 14 12/22/2020 0808   CREATININE 0.79 03/28/2021 0809   CREATININE 1.10 05/31/2013 1020   CALCIUM 8.2 (L) 03/28/2021 0809   PROT 6.4 (L) 03/28/2021 0809   PROT 6.4 12/22/2020 0808   ALBUMIN 3.0 (L) 03/28/2021 0809   ALBUMIN 3.6 (L) 12/22/2020 0808   AST 26 03/28/2021 0809   ALT 33 03/28/2021 0809   ALKPHOS 253 (H) 03/28/2021 0809   BILITOT 0.3 03/28/2021 0809   BILITOT 0.2 12/22/2020 0808   GFRNONAA >60 03/28/2021 0809   GFRAA 87 06/02/2020 0812     Diabetic Labs (most recent): Lab Results  Component Value Date   HGBA1C 6.8 04/09/2021   HGBA1C 7.9 (A) 01/02/2021   HGBA1C 12.8 (H) 11/04/2020      Lipid Panel ( most recent) Lipid Panel     Component Value Date/Time   CHOL 117 06/02/2020 0812   TRIG 182 (H) 06/02/2020 0812   HDL 36 (L) 06/02/2020 0812   CHOLHDL 3.3 06/02/2020 0812   CHOLHDL 3.5 05/31/2013 1020   VLDL 23 05/31/2013 1020   LDLCALC 51 06/02/2020 0812   LABVLDL 30 06/02/2020 0812      Lab Results  Component Value Date   TSH 3.350 12/22/2020   TSH 0.529 11/03/2020   TSH 0.438 (L) 06/02/2020   TSH 1.070 11/24/2019   TSH 5.940 (H) 04/05/2019   TSH 0.225 (L) 10/07/2018   TSH 1.210 04/08/2018   TSH 0.344 (L) 07/17/2017   TSH 0.608 12/09/2016   TSH 0.927 05/31/2016   FREET4 1.22 12/22/2020   FREET4 1.65 12/09/2016   FREET4 0.92 11/28/2014      Assessment & Plan:   1. Type 2 diabetes mellitus with stage  3a chronic kidney disease, with long-term current use of insulin ([place])   - Araf GIANG HEMME has currently uncontrolled symptomatic type 2 DM since  65 years of age.  He is responding to the MDI.  He presents with CGM device.  His AGP reports 83% time range, 13% above range.  0% hypoglycemia.  His average blood glucose is 130 mg/day.  His point-of-care A1c 6.8% improving from 12.8% overall.    -His interval developments were reviewed.  He was diagnosed with pancreatic cancer, currently being prepared for rounds of chemotherapy to be followed by surgery.   - I had a long discussion with him about the progressive nature of diabetes and the pathology behind its complications. He will need further study to classify his diabetes properly.  He will have an Tylosin antibodies and antiglutamic acid decarboxylase antibodies measurements. -his diabetes is complicated by recent severe hyperglycemia, DKA and he remains at a high risk for more acute and chronic complications which include CAD, CVA, CKD, retinopathy, and neuropathy. These are all discussed in detail with him.  - I have counseled him on diet  and weight management  by adopting a carbohydrate  restricted/protein rich diet. Patient is encouraged to switch to  unprocessed or minimally processed     complex starch and increased protein intake (animal or plant source), fruits, and vegetables. -  he is advised to stick to a routine mealtimes to eat 3 meals  a day and avoid unnecessary snacks ( to snack only to correct hypoglycemia).   - he acknowledges that there is a room for improvement in his food and drink choices. - Suggestion is made for him to avoid simple carbohydrates  from his diet including Cakes, Sweet Desserts, Ice Cream, Soda (diet and regular), Sweet Tea, Candies, Chips, Cookies, Store Bought Juices, Alcohol in Excess of  1-2 drinks a day, Artificial Sweeteners,  Coffee Creamer, and "Sugar-free" Products, Lemonade. This will help patient to have more stable blood glucose profile and potentially avoid unintended weight gain.   - he will be scheduled with Jearld Fenton, RDN, CDE for diabetes education.  He has seen her before, lost contact for the last 3 years.  - I have approached him with the following individualized plan to manage  his diabetes and patient agrees:   -In light of his ongoing diagnosis and treatment for pancreatic cancer, he will be kept away from oral medications or GLP-1 receptor agonist.  He is being considered for surgery for pancreatic cancer.    He will continue on intensive basal/bolus insulin in order for him to achieve and maintain control of diabetes to target.    -His is to increase continue Levemir at a lower dose of 30 units nightly, continue Humalog at a lower dose of 8 units 3 times a day with meals  for pre-meal BG readings of 90-150mg /dl, plus patient specific correction dose for unexpected hyperglycemia above 150mg /dl, associated with strict monitoring of glucose 4 times a day-before meals and at bedtime. - he is warned not to take insulin without proper monitoring per orders. - Adjustment parameters are given to him for hypo and hyperglycemia  in writing. - he is encouraged to call clinic for blood glucose levels less than 70 or above 300 mg /dl. -He is benefiting from his CGM, advised to continue to wear it at all times.   - Specific targets for  A1c;  LDL, HDL,  and Triglycerides were discussed with the patient.  2) Blood Pressure /Hypertension:  His blood pressure is controlled to target.    3) Lipids/Hyperlipidemia:   Review of his recent lipid panel showed uncontrolled triglyceride at 182, controlled LDL at 51.  He is currently on Crestor 40 mg p.o. nightly.   He is advised to continue on same.    4)  Weight/Diet:  Body mass index is 24.3 kg/m.  -This patient weighed as high as 285 in the past.  Over the years he lost weight, initially intentionally, later unintentionally.  He has history of moderate alcohol consumption for decades.  He is not suspect for malabsorption at this time.  He is not a candidate for weight loss. After his pancreatectomy, he may need Creon intervention to help with digestion and absorption.    Exercise, and detailed carbohydrates information provided  -  detailed on discharge instructions.  5) hypothyroidism: Circumstance of diagnosis is not available.  He denies any surgery or thyroid ablation.  He has taken thyroid hormone for the last 2 years.  His previsit labs confirm Hashimoto's thyroiditis as a cause of hypothyroidism.  His TSH and free T4 are consistent with appropriate replacement.  He is advised to continue levothyroxine 175 mcg p.o. daily before breakfast.    - We discussed about the correct intake of his thyroid hormone, on empty stomach at fasting, with water, separated by at least 30 minutes from breakfast and other medications,  and separated by more than 4 hours from calcium, iron, multivitamins, acid reflux medications (PPIs). -Patient is made aware of the fact that thyroid hormone replacement is needed for life, dose to be adjusted by periodic monitoring of thyroid function  tests.   6) Chronic Care/Health Maintenance:  -he  is on Statin medications and  is encouraged to initiate and continue to follow up with Ophthalmology, Dentist,  Podiatrist at least yearly or according to recommendations, and advised to  quit smoking. I have recommended yearly flu vaccine and pneumonia vaccine at least every 5 years; moderate intensity exercise for up to 150 minutes weekly; and  sleep for at least 7 hours a day.  He is encouraged to continue follow-up with his oncology team regarding a recent diagnosis of pancreatic cancer.    The patient was counseled on the dangers of tobacco use, and was advised to quit.  Reviewed strategies to maximize success, including removing cigarettes and smoking materials from environment.   - he is  advised to maintain close follow up with [name], MD for primary care needs, as well as his other providers for optimal and coordinated care.     I spent 42 minutes in the care of the patient today including review of labs from [facility], Lipids, Thyroid Function, Hematology (current and previous including abstractions from other facilities); face-to-face time discussing  his blood glucose readings/logs, discussing hypoglycemia and hyperglycemia episodes and symptoms, medications doses, his options of short and long term treatment based on the latest standards of care / guidelines;  discussion about incorporating lifestyle medicine;  and documenting the encounter.    Please refer to Patient Instructions for Blood Glucose Monitoring and Insulin/Medications Dosing Guide"  in media tab for additional information. Please  also refer to " Patient Self Inventory" in the Media  tab for reviewed elements of pertinent patient history.  Alexander Banks participated in the discussions, expressed understanding, and voiced agreement with the above plans.  All questions were answered to his satisfaction. he is encouraged to contact clinic should he have any  questions or concerns prior  to his return visit.   Follow up plan: - Return in about 3 months (around 07/10/2021) for Bring Meter and Logs- A1c in Office.  Glade Lloyd, MD [facility] [address] [name], [place] [address] Phone: [phone]  Fax: [phone]    04/09/2021, 12:41 PM  This note was partially dictated with voice recognition software. Similar sounding words can be transcribed inadequately or may not  be corrected upon review.

## 2021-04-09 NOTE — Patient Instructions (Addendum)
  Goals Established by Pt Goals Keep up the great job. Eat 60-75 g CHO at each meals Focus on more plant based foods, see plant based handout. Check out [employer] of Lifestyle medicine website and [place] [employer] [place] for more information and recipes. Focus on nutrient dense foods and eat what you can. Medications can be adjusted. Avoid simple sugars  Lifestyle Medicine - Whole Food, Plant Predominant Nutrition is highly recommended: Eat Plenty of vegetables, Mushrooms, fruits, Legumes, Whole Grains, Nuts, seeds in lieu of processed meats, processed snacks/pastries red meat, poultry, eggs.    -It is better to avoid simple carbohydrates including: Cakes, Sweet Desserts, Ice Cream, Soda (diet and regular), Sweet Tea, Candies, Chips, Cookies, Store Bought Juices, Alcohol in Excess of  1-2 drinks a day, Lemonade,  Artificial Sweeteners, Doughnuts, Coffee Creamers, "Sugar-free" Products, etc, etc.  This is not a complete list.....  Exercise: If you are able: 30 -60 minutes a day ,4 days a week, or 150 minutes a week.  The longer the better.  Combine stretch, strength, and aerobic activities.  If you were told in the past that you have high risk for cardiovascular diseases, you may seek evaluation by your heart doctor prior to initiating moderate to intense exercise programs.

## 2021-04-10 ENCOUNTER — Ambulatory Visit (INDEPENDENT_AMBULATORY_CARE_PROVIDER_SITE_OTHER): Payer: BC Managed Care – PPO | Admitting: Internal Medicine

## 2021-04-20 ENCOUNTER — Other Ambulatory Visit: Payer: Self-pay

## 2021-04-20 ENCOUNTER — Ambulatory Visit (INDEPENDENT_AMBULATORY_CARE_PROVIDER_SITE_OTHER): Payer: BC Managed Care – PPO | Admitting: Family Medicine

## 2021-04-20 VITALS — BP 122/76 | Temp 98.0°F | Wt 195.6 lb

## 2021-04-20 DIAGNOSIS — E1169 Type 2 diabetes mellitus with other specified complication: Secondary | ICD-10-CM | POA: Diagnosis not present

## 2021-04-20 DIAGNOSIS — E119 Type 2 diabetes mellitus without complications: Secondary | ICD-10-CM

## 2021-04-20 DIAGNOSIS — G4733 Obstructive sleep apnea (adult) (pediatric): Secondary | ICD-10-CM

## 2021-04-20 DIAGNOSIS — E785 Hyperlipidemia, unspecified: Secondary | ICD-10-CM

## 2021-04-20 DIAGNOSIS — E038 Other specified hypothyroidism: Secondary | ICD-10-CM

## 2021-04-20 MED ORDER — LEVOTHYROXINE SODIUM 175 MCG PO TABS: ORAL_TABLET | ORAL | 1 refills | Status: DC

## 2021-04-20 NOTE — Progress Notes (Signed)
   Subjective:    Patient ID: Alexander Banks, male    DOB: 1955/07/05, 65 y.o.   MRN: [id number]  HPI Pt here today to discuss new CPAP machine. Pt states he is needing new everything.  Patient has been on CPAP for over 8 years.  States machine not working as well as it should.  Having some problems with it.  Patient is due for updated machine.  Uses 12 cm.  Has had a previous positive sleep study  Pt insurance will no longer cover Synthroid after May 20, 2021 patient will need to be on generic levothyroxine after January 1   Pt is wondering if he should begin Pravastatin again.  Was on cholesterol medicine atorvastatin and Crestor then his liver enzymes went up when he was diagnosed with pancreatic tumor but now his liver enzymes have gone down so therefore no longer is a contraindication to statins  Has diabetes being followed by endocrinology but recently his blood sugars have been low so therefore he will need to reduce his long-acting insulin  Patient with also being treated by oncology for his pancreatic cancer currently not a candidate for a standard [place] procedure has a different procedure potentially being done by specialist in [place] and they are looking at him this coming week  Review of Systems     Objective:   Physical Exam  General-in no acute distress Eyes-no discharge Lungs-respiratory rate normal, CTA CV-no murmurs,RRR Extremities skin warm dry no edema Neuro grossly normal Behavior normal, alert  Moles that he pointed out were looked at overall they look good no sign of melanoma     Assessment & Plan:  1. Diabetes mellitus without complication ([place]) Will reduce his long-acting insulin from 30 down to 28 avoid low sugar spells may need to reduce further we are happy to help him out as well as following up with endocrinology - Urine Microalbumin w/creat. ratio - Lipid Profile  2. Other specified hypothyroidism Generic levothyroxine in the future.   Continue what he has on hand currently - Urine Microalbumin w/creat. ratio - Lipid Profile  3. Obstructive sleep apnea New sleep machine necessary.  We will get this through referrals.  If patient needs to have a new sleep study then we will need to set that up as well he has known sleep apnea, previous positive test, on 12 cm pressure  4. Hyperlipidemia associated with type 2 diabetes mellitus ([place]) Will need to restart statin check lipid profile first recent liver profile looks good - Urine Microalbumin w/creat. ratio - Lipid Profile

## 2021-04-20 NOTE — Patient Instructions (Signed)

## 2021-04-23 ENCOUNTER — Telehealth (HOSPITAL_COMMUNITY): Payer: Self-pay | Admitting: Dietician

## 2021-04-23 ENCOUNTER — Encounter (HOSPITAL_COMMUNITY): Payer: BC Managed Care – PPO | Admitting: Dietician

## 2021-04-23 NOTE — Telephone Encounter (Signed)
Nutrition Follow-up:  Patient completed 5 cycles neoadjuvant FOLFRINOX for pancreatic cancer.  Spoke with patient via telephone. He reports appetite is good, taste is improving. He is eating a variety of foods, drinking oral nutrition supplements occasionally. Patient denies nausea, vomiting, diarrhea, constipation. Patient reports he has consult with surgeon in [place] via telehealth on Thursday, states things are looking good.    Medications: reviewed  Labs: 11/21- HgbA1c 6.8  Anthropometrics: Patient weighed 195 lb 9.6 oz on 12/2 increased from 191 lb 3.2 oz on 11/9  10/26 - 189 lb 10/12 - 193 lb 4.8 oz   NUTRITION DIAGNOSIS: Unintentional weight loss stable    INTERVENTION:  Encouraged high calorie, high protein foods for weight maintenance Discussed importance of protein prior to surgery Encouraged drinking 1-2 Ensure Plus/equivalent daily  Patient has contact information    MONITORING, EVALUATION, GOAL: weight trends, intake    NEXT VISIT: via telephone ~6 weeks

## 2021-04-24 NOTE — Progress Notes (Signed)
Script written out and on provider door for signature. Demographics and recent office visit also printed out. Will fax to [facility] once signed.

## 2021-04-26 DIAGNOSIS — C250 Malignant neoplasm of head of pancreas: Secondary | ICD-10-CM | POA: Diagnosis not present

## 2021-04-30 ENCOUNTER — Other Ambulatory Visit: Payer: Self-pay

## 2021-04-30 ENCOUNTER — Ambulatory Visit (INDEPENDENT_AMBULATORY_CARE_PROVIDER_SITE_OTHER): Payer: BC Managed Care – PPO | Admitting: Family Medicine

## 2021-04-30 ENCOUNTER — Ambulatory Visit: Payer: Self-pay | Admitting: Genetic Counselor

## 2021-04-30 ENCOUNTER — Telehealth: Payer: Self-pay | Admitting: Genetic Counselor

## 2021-04-30 ENCOUNTER — Encounter: Payer: Self-pay | Admitting: Genetic Counselor

## 2021-04-30 ENCOUNTER — Ambulatory Visit (HOSPITAL_COMMUNITY)
Admission: RE | Admit: 2021-04-30 | Discharge: 2021-04-30 | Disposition: A | Discharge status: Home / Self Care | Payer: BC Managed Care – PPO | Source: Ambulatory Visit | Attending: Family Medicine | Admitting: Family Medicine

## 2021-04-30 VITALS — BP 167/86 | HR 108 | Temp 102.6°F | Wt 202.6 lb

## 2021-04-30 DIAGNOSIS — R509 Fever, unspecified: Secondary | ICD-10-CM

## 2021-04-30 DIAGNOSIS — Z1379 Encounter for other screening for genetic and chromosomal anomalies: Secondary | ICD-10-CM | POA: Insufficient documentation

## 2021-04-30 IMAGING — DX DG CHEST 2V
2 series · 2 of 2 positions shown · non-contrast
Comparison: [DATE]

CLINICAL DATA: Fever for 1 day

EXAM:
CHEST - 2 VIEW

[chest pa]
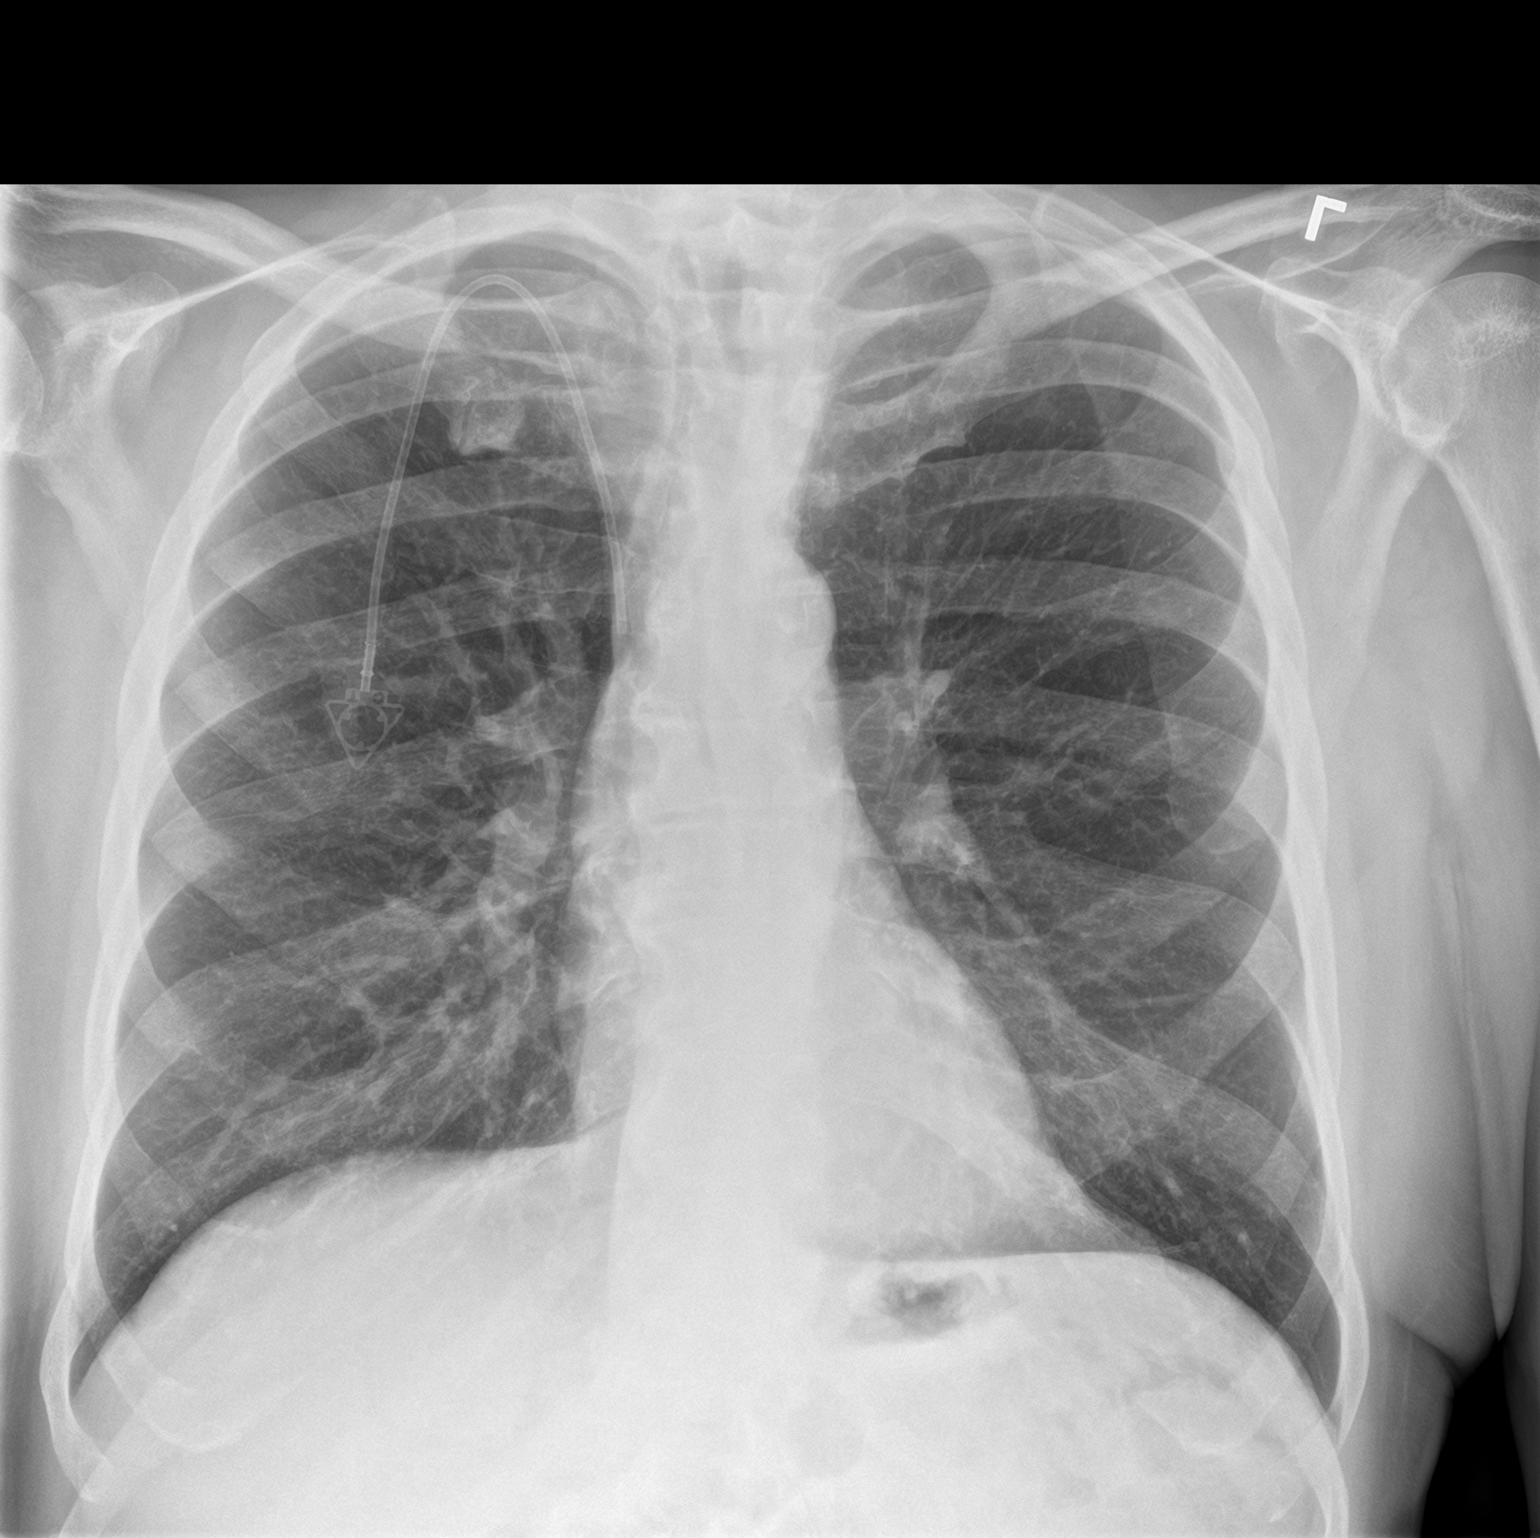

[chest lat]
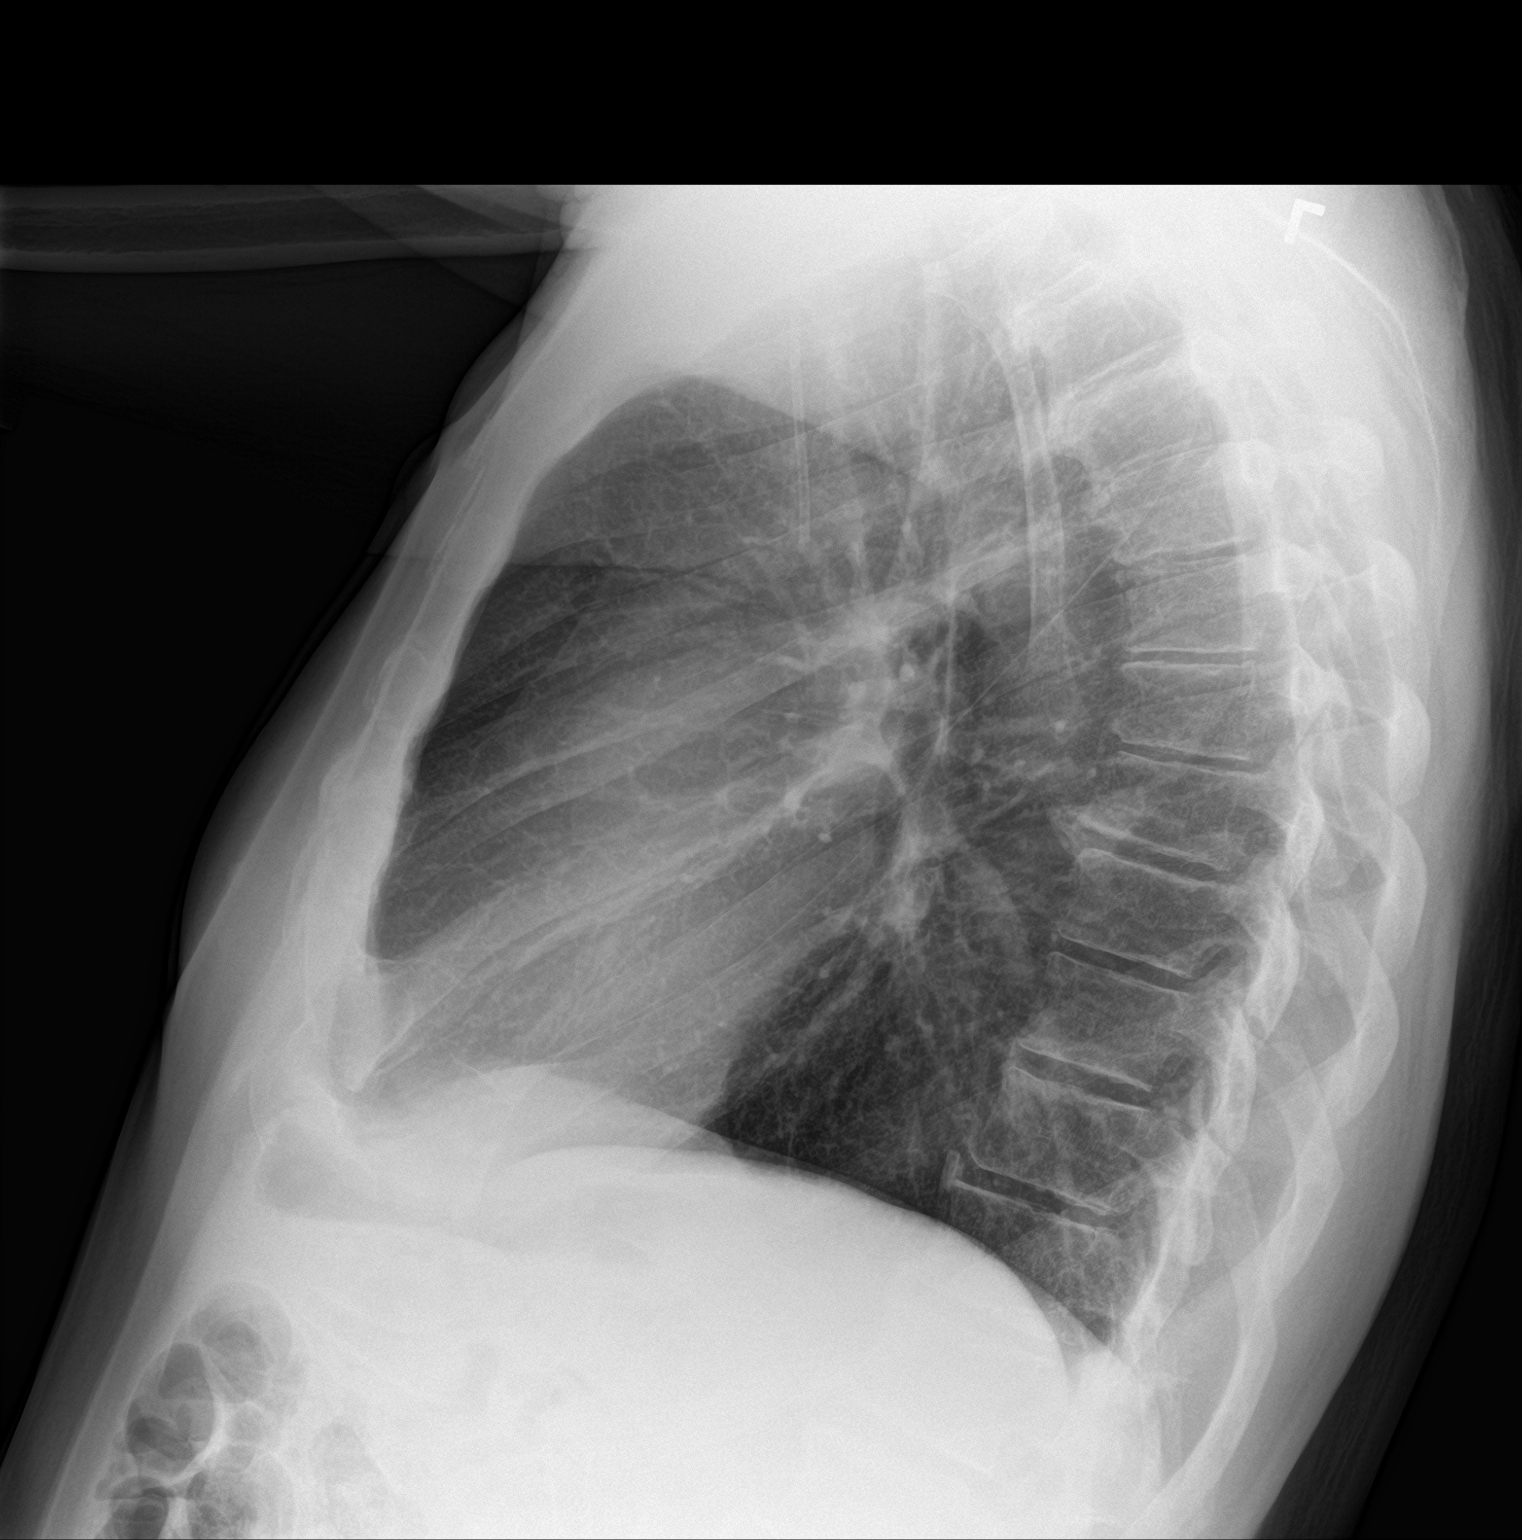

[2 of 2 positions shown; findings below may reference images not displayed]

FINDINGS: Frontal and lateral views of the chest demonstrate an unremarkable
cardiac silhouette. Stable right chest wall port. No airspace
disease, effusion, or pneumothorax. No acute bony abnormalities.
IMPRESSION: 1. No acute intrathoracic process.

## 2021-04-30 NOTE — Progress Notes (Signed)
HPI:   Alexander Banks was previously seen in the [place] clinic due to a personal and family history of cancer and concerns regarding a hereditary predisposition to cancer. Please refer to our prior cancer genetics clinic note for more information regarding our discussion, assessment and recommendations, at the time. Alexander Banks recent genetic test results were disclosed to him, as were recommendations warranted by these results. These results and recommendations are discussed in more detail below.  CANCER HISTORY:  Oncology History  Pancreatic adenocarcinoma ([place])  12/04/2020 Initial Diagnosis   Pancreatic adenocarcinoma ([place])   01/10/2021 -  Chemotherapy   Patient is on Treatment Plan : PANCREAS Modified FOLFIRINOX q14d x 4 cycles      Genetic Testing   [facility] CancerNext-Expanded Panel+RNA was Negative. Of note, a variant of uncertain significance was detected in the BRCA1 gene. Report date is 04/23/2021.  The CancerNext-Expanded gene panel offered by [facility] and includes sequencing, rearrangement, and RNA analysis for the following 77 genes: AIP, ALK, APC, ATM, AXIN2, BAP1, BARD1, BLM, BMPR1A, BRCA1, BRCA2, BRIP1, CDC73, CDH1, CDK4, CDKN1B, CDKN2A, CHEK2, CTNNA1, DICER1, FANCC, FH, FLCN, GALNT12, KIF1B, LZTR1, MAX, MEN1, MET, MLH1, MSH2, MSH3, MSH6, MUTYH, NBN, NF1, NF2, NTHL1, PALB2, PHOX2B, PMS2, POT1, PRKAR1A, PTCH1, PTEN, RAD51C, RAD51D, RB1, RECQL, RET, SDHA, SDHAF2, SDHB, SDHC, SDHD, SMAD4, SMARCA4, SMARCB1, SMARCE1, STK11, SUFU, TMEM127, TP53, TSC1, TSC2, VHL and XRCC2 (sequencing and deletion/duplication); EGFR, EGLN1, HOXB13, KIT, MITF, PDGFRA, POLD1, and POLE (sequencing only); EPCAM and GREM1 (deletion/duplication only).      FAMILY HISTORY:  We obtained a detailed, 4-generation family history.  Significant diagnoses are listed below:     Alexander Banks sister was diagnosed with leukemia at an unknown age, she is deceased. His maternal grandmother was diagnosed  with an unknown cancer type in her 46s/80s, she is deceased. Alexander Banks is unaware of previous family history of genetic testing for hereditary cancer risks.     GENETIC TEST RESULTS:  The [facility] CancerNext-Expanded Panel found no pathogenic mutations.   The CancerNext-Expanded gene panel offered by [facility] and includes sequencing, rearrangement, and RNA analysis for the following 77 genes: AIP, ALK, APC, ATM, AXIN2, BAP1, BARD1, BLM, BMPR1A, BRCA1, BRCA2, BRIP1, CDC73, CDH1, CDK4, CDKN1B, CDKN2A, CHEK2, CTNNA1, DICER1, FANCC, FH, FLCN, GALNT12, KIF1B, LZTR1, MAX, MEN1, MET, MLH1, MSH2, MSH3, MSH6, MUTYH, NBN, NF1, NF2, NTHL1, PALB2, PHOX2B, PMS2, POT1, PRKAR1A, PTCH1, PTEN, RAD51C, RAD51D, RB1, RECQL, RET, SDHA, SDHAF2, SDHB, SDHC, SDHD, SMAD4, SMARCA4, SMARCB1, SMARCE1, STK11, SUFU, TMEM127, TP53, TSC1, TSC2, VHL and XRCC2 (sequencing and deletion/duplication); EGFR, EGLN1, HOXB13, KIT, MITF, PDGFRA, POLD1, and POLE (sequencing only); EPCAM and GREM1 (deletion/duplication only).    The test report has been scanned into EPIC and is located under the Molecular Pathology section of the Results Review tab.  A portion of the result report is included below for reference. Genetic testing reported out on 04/23/2021.        Genetic testing identified a variant of uncertain significance (VUS) in the BRCA1 gene.  At this time, it is unknown if this variant is associated with an increased risk for cancer or if it is benign, but most uncertain variants are reclassified to benign. It should not be used to make medical management decisions. With time, we suspect the laboratory will determine the significance of this variant, if any. If the laboratory reclassifies this variant, we will attempt to contact Alexander Banks to discuss it further.   Even though a pathogenic variant was not identified,  possible explanations for his personal history of cancer may include: There may be no hereditary risk for cancer in  the family. The cancers in Alexander Banks and/or his family may be due to other genetic or environmental factors. There may be a gene mutation in one of these genes that current testing methods cannot detect, but that chance is small. There could be another gene that has not yet been discovered, or that we have not yet tested, that is responsible for the cancer diagnoses in the family.  The variant of uncertain significance detected in the BRCA1 gene may be reclassified as a pathogenic variant in the future. At this time, we do not know if this variant increases the risk for cancer.  Therefore, it is important to remain in touch with cancer genetics in the future so that we can continue to offer Alexander Banks the most up to date genetic testing.   ADDITIONAL GENETIC TESTING:  We discussed with Alexander Banks that his genetic testing was fairly extensive.  If there are genes identified to increase cancer risk that can be analyzed in the future, we would be happy to discuss and coordinate this testing at that time.    CANCER SCREENING RECOMMENDATIONS:  Alexander Banks test result is considered negative (normal). This means that we have not identified a hereditary cause for his personal and family history of cancer at this time. Most cancers happen by chance and this negative test suggests that his cancer may fall into this category.    An individual's cancer risk and medical management are not determined by genetic test results alone. Overall cancer risk assessment incorporates additional factors, including personal medical history, family history, and any available genetic information that may result in a personalized plan for cancer prevention and surveillance. Therefore, it is recommended he continue to follow the cancer management and screening guidelines provided by his oncology and primary healthcare provider.  RECOMMENDATIONS FOR FAMILY MEMBERS:   Since he did not inherit a mutation in a cancer predisposition  gene included on this panel, his children could not have inherited a mutation from him in one of these genes. We do not recommend familial testing for the BRCA1 variant of uncertain significance (VUS).  FOLLOW-UP:  Cancer genetics is a rapidly advancing field and it is possible that new genetic tests will be appropriate for him and/or his family members in the future. We encouraged him to remain in contact with cancer genetics on an annual basis so we can update his personal and family histories and let him know of advances in cancer genetics that may benefit this family.   Our contact number was provided. Alexander Banks questions were answered to his satisfaction, and he knows he is welcome to call us at anytime with additional questions or concerns.   Lucille Passy, MS, [facility] Genetic Counselor [email or web address] (P) [phone]

## 2021-04-30 NOTE — Progress Notes (Signed)
   Subjective:    Patient ID: Alexander Banks, male    DOB: November 13, 1955, 65 y.o.   MRN: [id number]  Fever  This is a new problem. The current episode started today. Associated symptoms comments: Body aches and fatigue.   Patient state she is due to have cancer surgery in 2 weeks and wanted to be checked out  Denies abd pain no HA  Has had runny nose for 1 week Denies muscle aches Feels fatigued No v or d Review of Systems  Constitutional:  Positive for fever.      Objective:   Physical Exam Gen-NAD not toxic TMS-normal bilateral T- normal no redness Chest-CTA respiratory rate normal no crackles CV RRR no murmur Skin-warm dry Neuro-grossly normal Abd soft  Temp 102 No resp distress    Assessment & Plan:  Cxr nl Labs pending Hold on antibiotics Triple swab today Tylenol or ibuprofen Doubt intra abd infection currently Await labs if woirse go to ED

## 2021-04-30 NOTE — Telephone Encounter (Signed)
I contacted Mr. Meinecke to discuss his genetic testing results. No pathogenic variants were identified in the 77 genes analyzed. Of note, a variant of uncertain significance was identified in the BRCA1 gene.   The test report has been scanned into EPIC and is located under the Molecular Pathology section of the Results Review tab.  A portion of the result report is included below for reference. Detailed clinic note to follow.  Lucille Passy, MS, [facility] Genetic Counselor [email or web address] (P) [phone]

## 2021-05-01 ENCOUNTER — Other Ambulatory Visit: Payer: Self-pay | Admitting: Family Medicine

## 2021-05-01 ENCOUNTER — Encounter (HOSPITAL_COMMUNITY): Payer: Self-pay | Admitting: Hematology

## 2021-05-01 LAB — CBC WITH DIFFERENTIAL/PLATELET
Basophils Absolute: 0 10*3/uL (ref 0.0–0.2)
Basos: 1 %
EOS (ABSOLUTE): 0 10*3/uL (ref 0.0–0.4)
Eos: 1 %
Hematocrit: 41 % (ref 37.5–51.0)
Hemoglobin: 13.6 g/dL (ref 13.0–17.7)
Immature Grans (Abs): 0 10*3/uL (ref 0.0–0.1)
Immature Granulocytes: 0 %
Lymphocytes Absolute: 0.5 10*3/uL — ABNORMAL LOW (ref 0.7–3.1)
Lymphs: 17 %
MCH: 32.8 pg (ref 26.6–33.0)
MCHC: 33.2 g/dL (ref 31.5–35.7)
MCV: 99 fL — ABNORMAL HIGH (ref 79–97)
Monocytes Absolute: 0.7 10*3/uL (ref 0.1–0.9)
Monocytes: 21 %
Neutrophils Absolute: 1.9 10*3/uL (ref 1.4–7.0)
Neutrophils: 60 %
Platelets: 190 10*3/uL (ref 150–450)
RBC: 4.15 x10E6/uL (ref 4.14–5.80)
RDW: 13.9 % (ref 11.6–15.4)
WBC: 3.2 10*3/uL — ABNORMAL LOW (ref 3.4–10.8)

## 2021-05-01 LAB — COMPREHENSIVE METABOLIC PANEL
ALT: 44 IU/L (ref 0–44)
AST: 84 IU/L — ABNORMAL HIGH (ref 0–40)
Albumin/Globulin Ratio: 1.3 (ref 1.2–2.2)
Albumin: 3.5 g/dL — ABNORMAL LOW (ref 3.8–4.8)
Alkaline Phosphatase: 132 IU/L — ABNORMAL HIGH (ref 44–121)
BUN/Creatinine Ratio: 11 (ref 10–24)
BUN: 12 mg/dL (ref 8–27)
Bilirubin Total: 0.3 mg/dL (ref 0.0–1.2)
CO2: 23 mmol/L (ref 20–29)
Calcium: 8.2 mg/dL — ABNORMAL LOW (ref 8.6–10.2)
Chloride: 105 mmol/L (ref 96–106)
Creatinine, Ser: 1.11 mg/dL (ref 0.76–1.27)
Globulin, Total: 2.7 g/dL (ref 1.5–4.5)
Glucose: 166 mg/dL — ABNORMAL HIGH (ref 70–99)
Potassium: 4.2 mmol/L (ref 3.5–5.2)
Sodium: 140 mmol/L (ref 134–144)
Total Protein: 6.2 g/dL (ref 6.0–8.5)
eGFR: 75 mL/min/{1.73_m2} (ref >59)

## 2021-05-01 LAB — LIPASE: Lipase: 8 U/L — ABNORMAL LOW (ref 13–78)

## 2021-05-01 MED ORDER — AMOXICILLIN-POT CLAVULANATE 875-125 MG PO TABS: 1.0000 | ORAL_TABLET | Freq: Two times a day (BID) | ORAL | 0 refills | Status: DC

## 2021-05-02 ENCOUNTER — Other Ambulatory Visit: Payer: Self-pay | Admitting: Family Medicine

## 2021-05-02 ENCOUNTER — Telehealth: Payer: Self-pay | Admitting: Family Medicine

## 2021-05-02 LAB — COVID-19, FLU A+B AND RSV
Influenza A, NAA: NOT DETECTED
Influenza B, NAA: NOT DETECTED
RSV, NAA: NOT DETECTED
SARS-CoV-2, NAA: DETECTED — AB

## 2021-05-02 MED ORDER — NIRMATRELVIR/RITONAVIR (PAXLOVID)TABLET: 3.0000 | ORAL_TABLET | Freq: Two times a day (BID) | ORAL | 0 refills | Status: AC

## 2021-05-02 MED ORDER — OXYCODONE-ACETAMINOPHEN 5-325 MG PO TABS: 1.0000 | ORAL_TABLET | Freq: Four times a day (QID) | ORAL | 0 refills | Status: DC | PRN

## 2021-05-02 NOTE — Telephone Encounter (Signed)
Alexander Banks's COVID test came back positive.  I stopped his Augmentin.  He is having no abdominal symptoms.  Paxlovid was started.  His GFR is normal.  He was instructed to stop his rosuvastatin.  Please be aware he will be on this medication for 5 days.  This medication can have him drug interactions if he is being started on anything else over the next 5 days.  He will notify the surgeon in [place] handling his case.  If you are having any questions or concerns please let me know.  Clinically he is stable.  We will be following him closely.

## 2021-05-07 LAB — CULTURE, BLOOD (SINGLE)

## 2021-05-08 DIAGNOSIS — E038 Other specified hypothyroidism: Secondary | ICD-10-CM | POA: Diagnosis not present

## 2021-05-08 DIAGNOSIS — E119 Type 2 diabetes mellitus without complications: Secondary | ICD-10-CM | POA: Diagnosis not present

## 2021-05-08 DIAGNOSIS — E785 Hyperlipidemia, unspecified: Secondary | ICD-10-CM | POA: Diagnosis not present

## 2021-05-08 DIAGNOSIS — E1169 Type 2 diabetes mellitus with other specified complication: Secondary | ICD-10-CM | POA: Diagnosis not present

## 2021-05-09 LAB — LIPID PANEL
Chol/HDL Ratio: 3.2 ratio (ref 0.0–5.0)
Cholesterol, Total: 158 mg/dL (ref 100–199)
HDL: 50 mg/dL (ref >39)
LDL Chol Calc (NIH): 89 mg/dL (ref 0–99)
Triglycerides: 106 mg/dL (ref 0–149)
VLDL Cholesterol Cal: 19 mg/dL (ref 5–40)

## 2021-05-09 LAB — MICROALBUMIN / CREATININE URINE RATIO
Creatinine, Urine: 143.5 mg/dL
Microalb/Creat Ratio: 39 mg/g creat — ABNORMAL HIGH (ref 0–29)
Microalbumin, Urine: 56.6 ug/mL

## 2021-05-15 DIAGNOSIS — Z9221 Personal history of antineoplastic chemotherapy: Secondary | ICD-10-CM | POA: Diagnosis not present

## 2021-05-15 DIAGNOSIS — R9431 Abnormal electrocardiogram [ECG] [EKG]: Secondary | ICD-10-CM | POA: Diagnosis not present

## 2021-05-15 DIAGNOSIS — C250 Malignant neoplasm of head of pancreas: Secondary | ICD-10-CM | POA: Diagnosis not present

## 2021-05-15 DIAGNOSIS — E039 Hypothyroidism, unspecified: Secondary | ICD-10-CM | POA: Diagnosis not present

## 2021-05-15 DIAGNOSIS — Z9889 Other specified postprocedural states: Secondary | ICD-10-CM | POA: Diagnosis not present

## 2021-05-15 DIAGNOSIS — Z01818 Encounter for other preprocedural examination: Secondary | ICD-10-CM | POA: Diagnosis not present

## 2021-05-15 DIAGNOSIS — D630 Anemia in neoplastic disease: Secondary | ICD-10-CM | POA: Diagnosis not present

## 2021-05-15 DIAGNOSIS — K811 Chronic cholecystitis: Secondary | ICD-10-CM | POA: Diagnosis not present

## 2021-05-15 DIAGNOSIS — I10 Essential (primary) hypertension: Secondary | ICD-10-CM | POA: Diagnosis not present

## 2021-05-15 DIAGNOSIS — Z79890 Hormone replacement therapy (postmenopausal): Secondary | ICD-10-CM | POA: Diagnosis not present

## 2021-05-15 DIAGNOSIS — C772 Secondary and unspecified malignant neoplasm of intra-abdominal lymph nodes: Secondary | ICD-10-CM | POA: Diagnosis not present

## 2021-05-15 DIAGNOSIS — R000 Tachycardia, unspecified: Secondary | ICD-10-CM | POA: Diagnosis not present

## 2021-05-15 DIAGNOSIS — I871 Compression of vein: Secondary | ICD-10-CM | POA: Diagnosis not present

## 2021-05-15 DIAGNOSIS — E119 Type 2 diabetes mellitus without complications: Secondary | ICD-10-CM | POA: Diagnosis not present

## 2021-05-15 DIAGNOSIS — Z794 Long term (current) use of insulin: Secondary | ICD-10-CM | POA: Diagnosis not present

## 2021-05-15 DIAGNOSIS — F17200 Nicotine dependence, unspecified, uncomplicated: Secondary | ICD-10-CM | POA: Diagnosis not present

## 2021-05-15 DIAGNOSIS — K660 Peritoneal adhesions (postprocedural) (postinfection): Secondary | ICD-10-CM | POA: Diagnosis not present

## 2021-05-15 DIAGNOSIS — E785 Hyperlipidemia, unspecified: Secondary | ICD-10-CM | POA: Diagnosis not present

## 2021-05-15 DIAGNOSIS — D638 Anemia in other chronic diseases classified elsewhere: Secondary | ICD-10-CM | POA: Diagnosis not present

## 2021-05-22 ENCOUNTER — Telehealth: Payer: Self-pay

## 2021-05-22 NOTE — Telephone Encounter (Signed)
Transition Care Management Follow-up Telephone Call Date of discharge and from where: 05/20/2021 [facility] Diagnosis: Pancreatic Cancer, s/p whipple procedure How have you been since you were released from the hospital? Pt states he is doing well other than being "a little sore." Any questions or concerns? No  Items Reviewed: Did the pt receive and understand the discharge instructions provided? Yes  Medications obtained and verified? Yes  Other? No  Any new allergies since your discharge? No  Dietary orders reviewed? Yes Do you have support at home? Yes   Home Care and Equipment/Supplies: Were home health services ordered? no If so, what is the name of the agency? N/A  Has the agency set up a time to come to the patient's home? no Were any new equipment or medical supplies ordered?  No What is the name of the medical supply agency? N/A Were you able to get the supplies/equipment? not applicable Do you have any questions related to the use of the equipment or supplies? No  Functional Questionnaire: (I = Independent and D = Dependent) ADLs: I  Bathing/Dressing- I  Meal Prep- I  Eating- I  Maintaining continence- I  Transferring/Ambulation- I  Managing Meds- I  Follow up appointments reviewed:  PCP Hospital f/u appt confirmed? Yes  Scheduled to see Dr. Wolfgang Phoenix on 05/28/2021 @ 11:20. [facility] f/u appt confirmed? No  Awaiting call for surgical follow up. Are transportation arrangements needed? No  If their condition worsens, is the pt aware to call PCP or go to the Emergency Dept.? Yes Was the patient provided with contact information for the PCP's office or ED? Yes Was to pt encouraged to call back with questions or concerns? Yes

## 2021-05-22 NOTE — Telephone Encounter (Signed)
Pt states at last visit, he discussed getting a new CPAP machine because his is not working correctly. Pt states he has never heard anything back and wanted to know if an Rx could be sent to [facility]? Thank you!

## 2021-05-22 NOTE — Telephone Encounter (Signed)
Nurses please go ahead with prescription for a new CPAP machine.  [place].  Diagnosis sleep apnea

## 2021-05-23 NOTE — Telephone Encounter (Signed)
Spoke with [employer]- they requested insurance card and demographics resent to see if patient will require a new sleep study thru their insurance to get a new machine. Insurance information and demographics faxed to [employer]- [phone]

## 2021-05-24 DIAGNOSIS — C250 Malignant neoplasm of head of pancreas: Secondary | ICD-10-CM | POA: Diagnosis not present

## 2021-05-24 DIAGNOSIS — C259 Malignant neoplasm of pancreas, unspecified: Secondary | ICD-10-CM | POA: Diagnosis not present

## 2021-05-24 DIAGNOSIS — Z0000 Encounter for general adult medical examination without abnormal findings: Secondary | ICD-10-CM | POA: Diagnosis not present

## 2021-05-24 NOTE — Telephone Encounter (Signed)
Caryl Pina at [employer] is contacting the patient's insurance to see if a new sleep study would be required for a new machine. Caryl Pina will call us back after she speaks with insurance.

## 2021-05-28 ENCOUNTER — Inpatient Hospital Stay: Payer: BC Managed Care – PPO | Admitting: Family Medicine

## 2021-05-28 DIAGNOSIS — M542 Cervicalgia: Secondary | ICD-10-CM | POA: Diagnosis not present

## 2021-05-28 DIAGNOSIS — S134XXA Sprain of ligaments of cervical spine, initial encounter: Secondary | ICD-10-CM | POA: Diagnosis not present

## 2021-05-28 DIAGNOSIS — M47812 Spondylosis without myelopathy or radiculopathy, cervical region: Secondary | ICD-10-CM | POA: Diagnosis not present

## 2021-05-28 DIAGNOSIS — S233XXA Sprain of ligaments of thoracic spine, initial encounter: Secondary | ICD-10-CM | POA: Diagnosis not present

## 2021-05-29 DIAGNOSIS — S233XXA Sprain of ligaments of thoracic spine, initial encounter: Secondary | ICD-10-CM | POA: Diagnosis not present

## 2021-05-29 DIAGNOSIS — M47812 Spondylosis without myelopathy or radiculopathy, cervical region: Secondary | ICD-10-CM | POA: Diagnosis not present

## 2021-05-29 DIAGNOSIS — M542 Cervicalgia: Secondary | ICD-10-CM | POA: Diagnosis not present

## 2021-05-29 DIAGNOSIS — S134XXA Sprain of ligaments of cervical spine, initial encounter: Secondary | ICD-10-CM | POA: Diagnosis not present

## 2021-05-31 ENCOUNTER — Other Ambulatory Visit: Payer: Self-pay

## 2021-05-31 ENCOUNTER — Encounter: Payer: Self-pay | Admitting: Family Medicine

## 2021-05-31 ENCOUNTER — Ambulatory Visit (HOSPITAL_COMMUNITY)
Admission: RE | Admit: 2021-05-31 | Discharge: 2021-05-31 | Disposition: A | Discharge status: Home / Self Care | Payer: BC Managed Care – PPO | Source: Ambulatory Visit | Attending: Family Medicine | Admitting: Family Medicine

## 2021-05-31 ENCOUNTER — Other Ambulatory Visit (HOSPITAL_COMMUNITY)
Admission: RE | Admit: 2021-05-31 | Discharge: 2021-05-31 | Disposition: A | Discharge status: Home / Self Care | Payer: BC Managed Care – PPO | Source: Ambulatory Visit | Attending: Family Medicine | Admitting: Family Medicine

## 2021-05-31 ENCOUNTER — Telehealth: Payer: Self-pay | Admitting: Family Medicine

## 2021-05-31 ENCOUNTER — Ambulatory Visit (INDEPENDENT_AMBULATORY_CARE_PROVIDER_SITE_OTHER): Payer: BC Managed Care – PPO | Admitting: Family Medicine

## 2021-05-31 VITALS — BP 87/58 | HR 106 | Temp 98.3°F | Wt 192.6 lb

## 2021-05-31 DIAGNOSIS — I951 Orthostatic hypotension: Secondary | ICD-10-CM | POA: Diagnosis not present

## 2021-05-31 DIAGNOSIS — R600 Localized edema: Secondary | ICD-10-CM

## 2021-05-31 DIAGNOSIS — S233XXA Sprain of ligaments of thoracic spine, initial encounter: Secondary | ICD-10-CM | POA: Diagnosis not present

## 2021-05-31 DIAGNOSIS — S134XXA Sprain of ligaments of cervical spine, initial encounter: Secondary | ICD-10-CM | POA: Diagnosis not present

## 2021-05-31 DIAGNOSIS — Z8507 Personal history of malignant neoplasm of pancreas: Secondary | ICD-10-CM

## 2021-05-31 DIAGNOSIS — I10 Essential (primary) hypertension: Secondary | ICD-10-CM

## 2021-05-31 DIAGNOSIS — M47812 Spondylosis without myelopathy or radiculopathy, cervical region: Secondary | ICD-10-CM | POA: Diagnosis not present

## 2021-05-31 DIAGNOSIS — E039 Hypothyroidism, unspecified: Secondary | ICD-10-CM

## 2021-05-31 DIAGNOSIS — M542 Cervicalgia: Secondary | ICD-10-CM | POA: Diagnosis not present

## 2021-05-31 LAB — SEDIMENTATION RATE: Sed Rate: 27 mm/hr — ABNORMAL HIGH (ref 0–16)

## 2021-05-31 LAB — HEPATIC FUNCTION PANEL
ALT: 95 U/L — ABNORMAL HIGH (ref 0–44)
AST: 221 U/L — ABNORMAL HIGH (ref 15–41)
Albumin: 2.4 g/dL — ABNORMAL LOW (ref 3.5–5.0)
Alkaline Phosphatase: 140 U/L — ABNORMAL HIGH (ref 38–126)
Bilirubin, Direct: 0.3 mg/dL — ABNORMAL HIGH (ref 0.0–0.2)
Indirect Bilirubin: 0 mg/dL — ABNORMAL LOW (ref 0.3–0.9)
Total Bilirubin: 0.3 mg/dL (ref 0.3–1.2)
Total Protein: 5.8 g/dL — ABNORMAL LOW (ref 6.5–8.1)

## 2021-05-31 LAB — BASIC METABOLIC PANEL
Anion gap: 8 (ref 5–15)
BUN: 20 mg/dL (ref 8–23)
CO2: 24 mmol/L (ref 22–32)
Calcium: 7.7 mg/dL — ABNORMAL LOW (ref 8.9–10.3)
Chloride: 102 mmol/L (ref 98–111)
Creatinine, Ser: 1.01 mg/dL (ref 0.61–1.24)
GFR, Estimated: >60 mL/min (ref >60)
Glucose, Bld: 168 mg/dL — ABNORMAL HIGH (ref 70–99)
Potassium: 3.3 mmol/L — ABNORMAL LOW (ref 3.5–5.1)
Sodium: 134 mmol/L — ABNORMAL LOW (ref 135–145)

## 2021-05-31 LAB — D-DIMER, QUANTITATIVE: D-Dimer, Quant: 7.55 ug/mL-FEU — ABNORMAL HIGH (ref 0.00–0.50)

## 2021-05-31 LAB — TSH: TSH: 39.931 u[IU]/mL — ABNORMAL HIGH (ref 0.350–4.500)

## 2021-05-31 LAB — T4, FREE: Free T4: 0.88 ng/dL (ref 0.61–1.12)

## 2021-05-31 IMAGING — US US EXTREM LOW VENOUS
1 series · 13 of 24 positions shown · non-contrast
Comparison: None.

CLINICAL DATA: Bilateral Edema



[Series 1: us extrem low venous · 0.05mm/px · 13 of 64 slices shown]
[im 1/64]
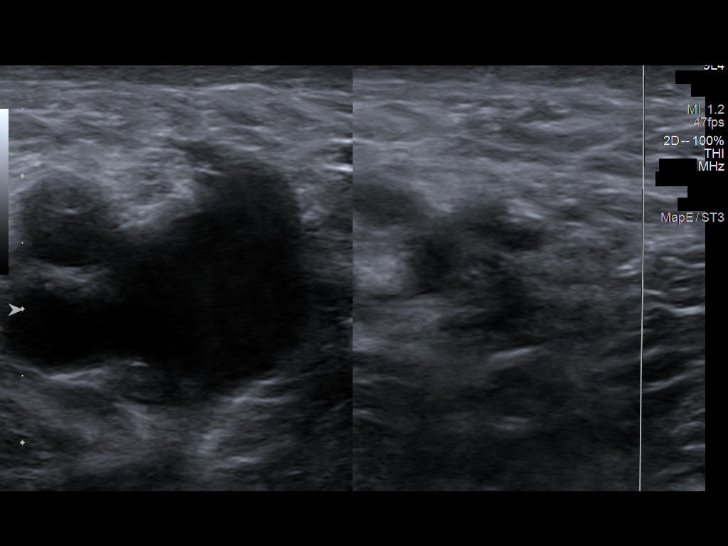
[im 6/64]
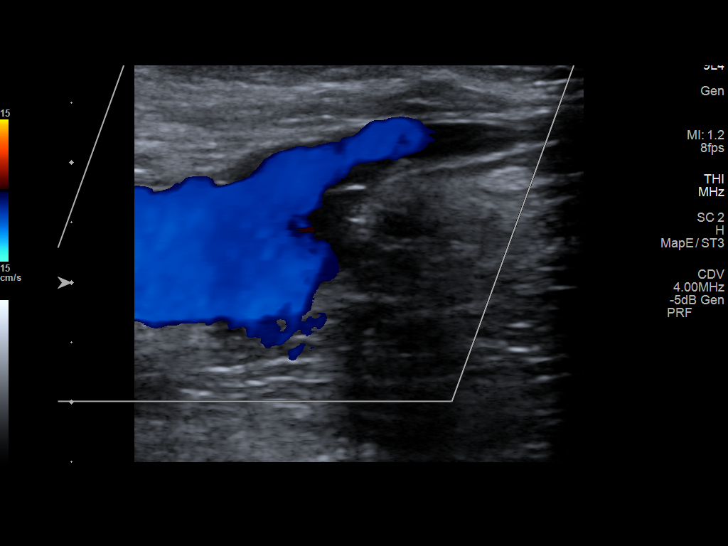
[im 11/64]
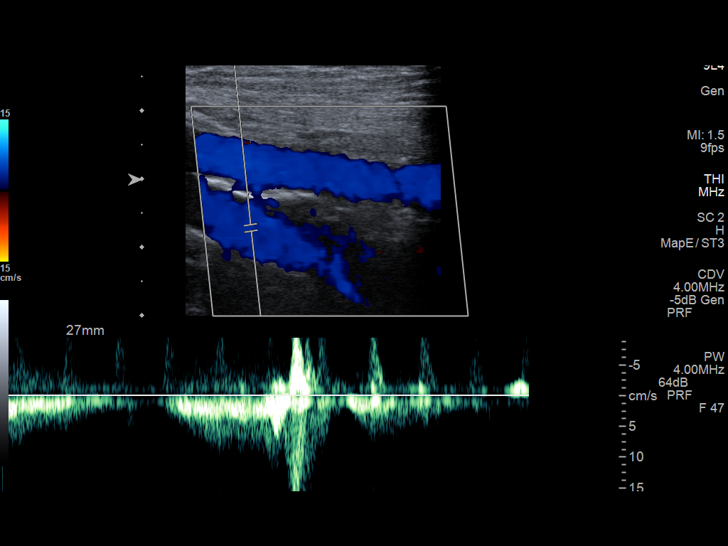
[im 17/64]
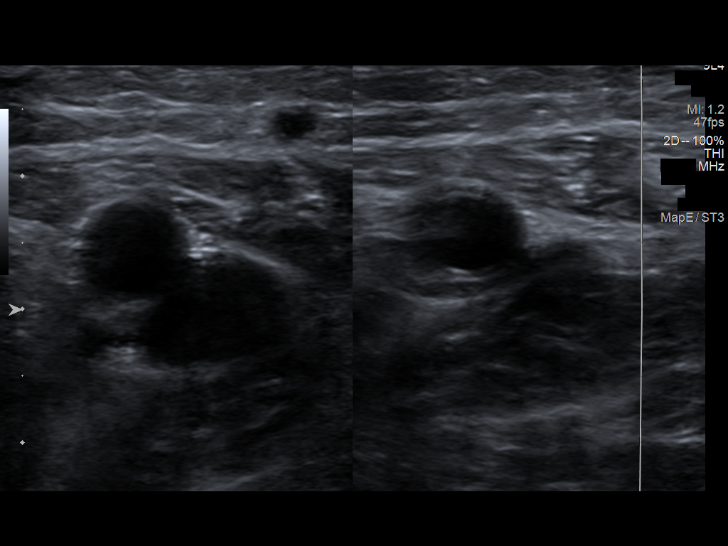
[im 22/64]
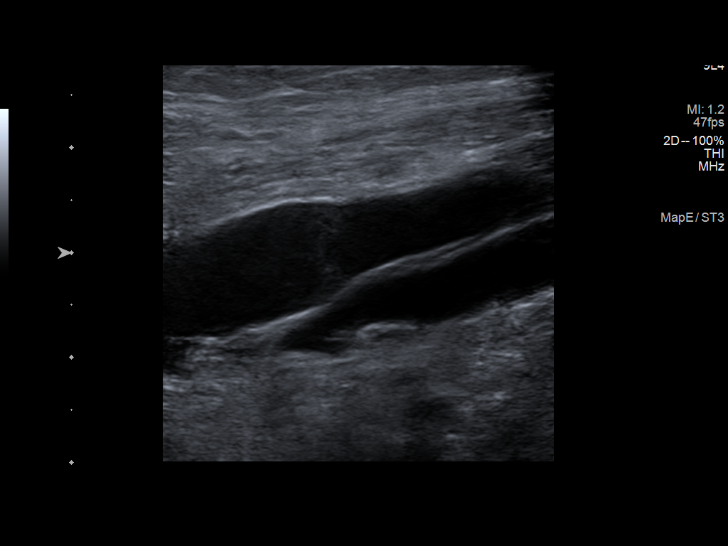
[im 28/64]
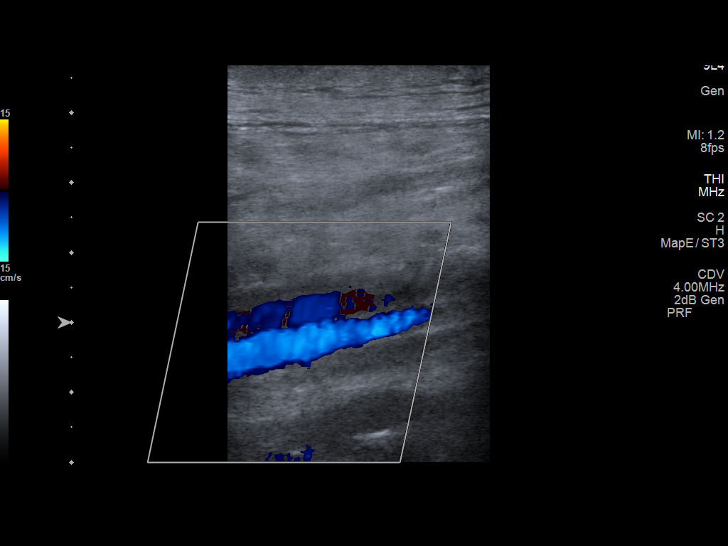
[im 33/64]
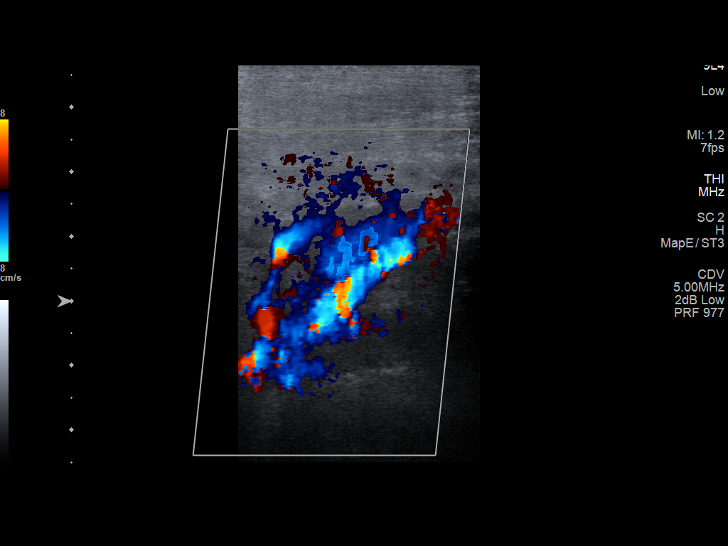
[im 36/64]
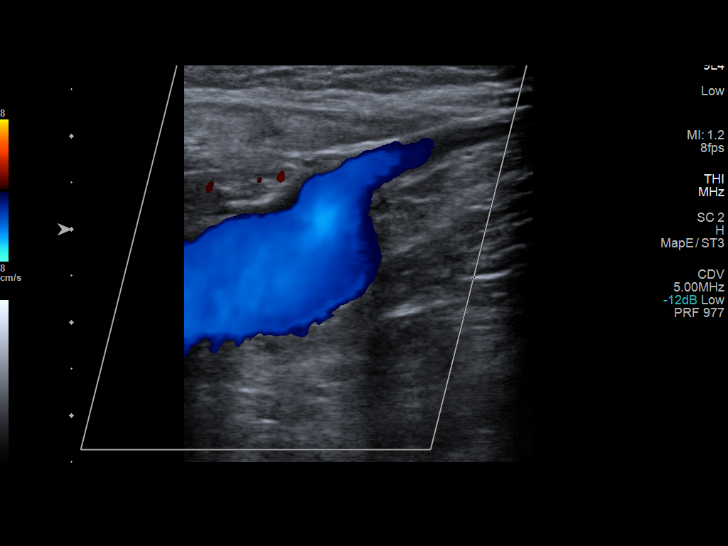
[im 42/64]
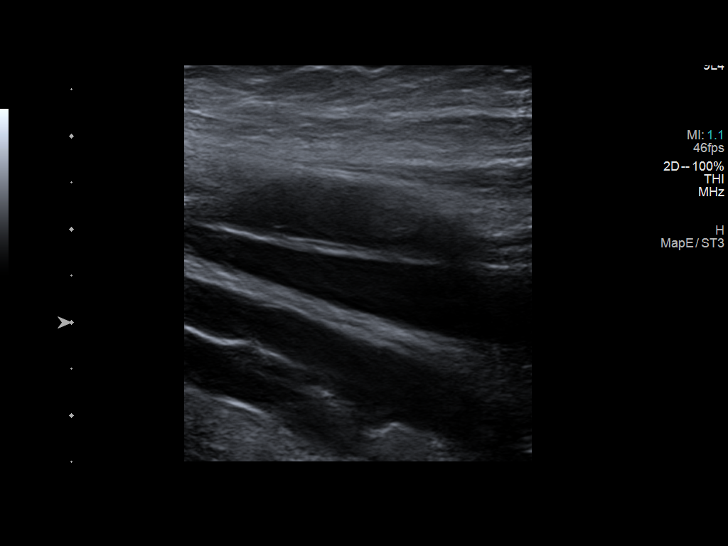
[im 47/64]
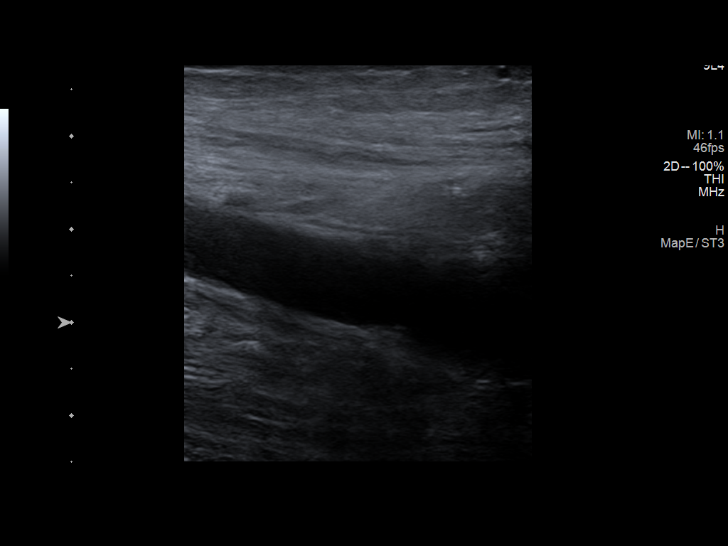
[im 53/64]
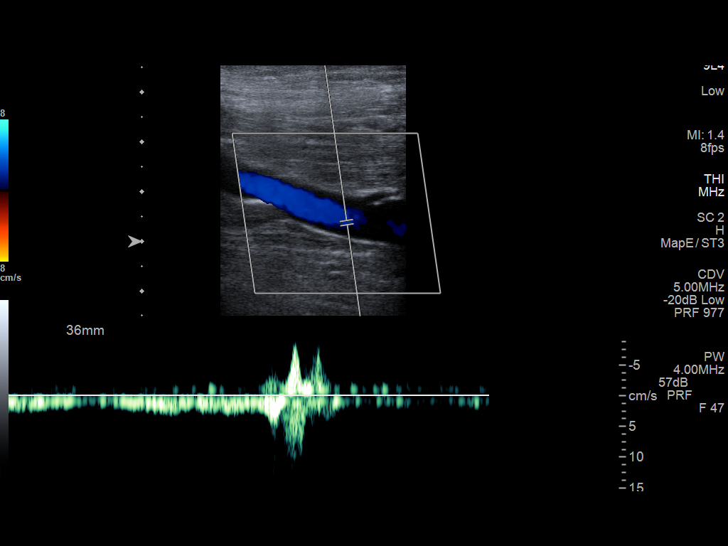
[im 58/64]
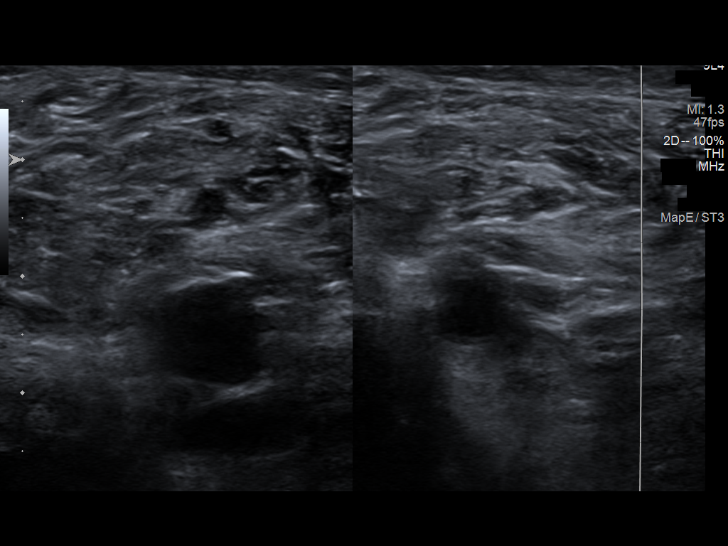
[im 64/64]
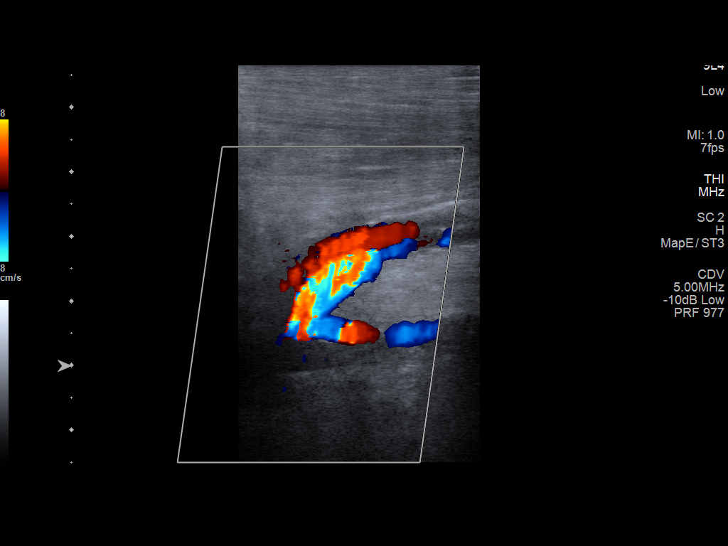

[13 of 24 positions shown; findings below may reference images not displayed]

FINDINGS: RIGHT LOWER EXTREMITY

Common Femoral Vein: No evidence of thrombus. Normal
compressibility, respiratory phasicity and response to augmentation.

Saphenofemoral Junction: No evidence of thrombus. Normal
compressibility and flow on color Doppler imaging.

Profunda Femoral Vein: No evidence of thrombus. Normal
compressibility and flow on color Doppler imaging.

Femoral Vein: No evidence of thrombus. Normal compressibility,
respiratory phasicity and response to augmentation.

Popliteal Vein: No evidence of thrombus. Normal compressibility,
respiratory phasicity and response to augmentation.

Calf Veins: No evidence of thrombus. Normal compressibility and flow
on color Doppler imaging.

LEFT LOWER EXTREMITY

Common Femoral Vein: No evidence of thrombus. Normal
compressibility, respiratory phasicity and response to augmentation.

Saphenofemoral Junction: No evidence of thrombus. Normal
compressibility and flow on color Doppler imaging.

Profunda Femoral Vein: No evidence of thrombus. Normal
compressibility and flow on color Doppler imaging.

Femoral Vein: No evidence of thrombus. Normal compressibility,
respiratory phasicity and response to augmentation.

Popliteal Vein: No evidence of thrombus. Normal compressibility,
respiratory phasicity and response to augmentation.

Calf Veins: No evidence of thrombus. Normal compressibility and flow
on color Doppler imaging.

Other Findings:  None.
IMPRESSION: No evidence of deep venous thrombosis in either lower extremity.

## 2021-05-31 MED ORDER — PANCRELIPASE (LIP-PROT-AMYL) 36000-114000 UNITS PO CPEP: ORAL_CAPSULE | ORAL | 11 refills | Status: DC

## 2021-05-31 NOTE — Telephone Encounter (Signed)
Nurses Please tell Zared that I did communicate with Dr. Nida-endocrinology He states that it is best for the patient to increase the dose of his thyroid medicine New dose 200 mcg 1 every morning, #90 with 1 refill The patient can let you know if he wants this through his local pharmacy or through his mail order  I have also put in a request to speak with his oncologist but we are waiting to hear back from them.  As soon as I hear something from them I will let [place] know-thanks-Dr. Nicki Reaper

## 2021-05-31 NOTE — Progress Notes (Signed)
Subjective:    Patient ID: Alexander Banks, male    DOB: 14-Apr-1956, 66 y.o.   MRN: [id number]  HPI Pt recently had surgery and now both ankles have been swollen about 3-4 days. Right is worse than left. Hands are also swollen. Diarrhea last couple of days.   Patient recently had modified Whipple procedure. Resected the majority of the pancreas Having some intermittent diarrhea issues Poor appetite Swelling in the legs Worse on the right than the left Was having some slight discomfort in his legs last week not this week No shortness of breath Lack of appetite Followed by oncology Followed by oncology surgeon History pancreatic cancer  Review of Systems     Objective:   Physical Exam Lungs are clear hearts regular pulse normal shows low blood pressure laying drops with sitting and standing       Assessment & Plan:  1. Pedal edema Significant pedal edema noted.  Lower leg edema as well.  Greater circumference on the right than the left no tenderness in the calf could be third spacing could be low albumin could also be related to amlodipine and gabapentin.  His pains under good control.  See below.  Go ahead with testing - Basic Metabolic Panel (BMET) - Hepatic function panel - TSH - T4, free - D-Dimer, Quantitative - Sed Rate (ESR) - US Venous Img Lower Bilateral  2. History of pancreatic cancer His appetite is terrible wife states they have 2 different medicines at home to try to stimulate appetite we will touch base with him later today more than likely initiate dose - Basic Metabolic Panel (BMET) - Hepatic function panel - TSH - T4, free - D-Dimer, Quantitative - Sed Rate (ESR)  3. Essential hypertension, benign Blood pressure good control. - Basic Metabolic Panel (BMET) - Hepatic function panel - TSH - T4, free - D-Dimer, Quantitative - Sed Rate (ESR)  4. Orthostatic hypotension Stop amlodipine.  This could help his pedal edema Also stop gabapentin I  believe side effects are causing swelling In addition to this follow-up again in approximately 1 month's time but await the test results Will make sure we keep specialist in the loop   Diarrhea issues-it could be pancreatic insufficiency as well already using Imodium as needed in addition to this recommend Creon 1 capsule with every small meal to with any large meal but see if that helps if that does not help recommend C. difficile testing because of recent hospitalization patient denies mucousy or bloody stool

## 2021-06-01 MED ORDER — LEVOTHYROXINE SODIUM 25 MCG PO TABS: ORAL_TABLET | ORAL | 0 refills | Status: DC

## 2021-06-01 NOTE — Telephone Encounter (Signed)
Spoke with Caryl Pina at [place]. She has spoke with the wife and insurance. Insurance states they are needing a copy of the sleep study (sleep study done about 20 years ago). Caryl Pina states she informed pt wife to request sleep study from [facility] and send it to her. Caryl Pina is also going to fax [place] a request for copy of sleep study also.

## 2021-06-01 NOTE — Telephone Encounter (Signed)
[place] but Caryl Pina does not come in until 4:30 today. Will call back this afternoon to speak with her

## 2021-06-01 NOTE — Telephone Encounter (Signed)
May give rx for levothyroxine 25 mcg #90 1 qd ( will add to the 175 so leave both in Epic) then when close to be done request 200 mcg one qd via [place] or Dr Dorris Fetch

## 2021-06-01 NOTE — Telephone Encounter (Signed)
Levothyroxine 25 mcg sent to [name]. Pt wife (DPR) contacted and verbalized understanding.

## 2021-06-01 NOTE — Telephone Encounter (Signed)
Pt contacted. Pt wife states they just received a new full bottle of Levothyroxine 175 mcg from [facility]. Pt/pt wife states they only need 25 mcg more and that way they would not have to keep getting new med- they would like the 25 mcg at least for 3 months. Would pick it up locally. Pt/pt wife informed that Dr.Scott is out of office today but we will get back in touch with them as soon as he replies. Pt/pt wife verbalized understanding. Please advise. Thank you

## 2021-06-04 ENCOUNTER — Encounter (HOSPITAL_COMMUNITY): Payer: BC Managed Care – PPO | Admitting: Dietician

## 2021-06-04 ENCOUNTER — Telehealth: Payer: Self-pay | Admitting: Family Medicine

## 2021-06-04 ENCOUNTER — Telehealth (HOSPITAL_COMMUNITY): Payer: Self-pay | Admitting: Dietician

## 2021-06-04 ENCOUNTER — Encounter: Payer: Self-pay | Admitting: Family Medicine

## 2021-06-04 ENCOUNTER — Other Ambulatory Visit: Payer: Self-pay | Admitting: *Deleted

## 2021-06-04 DIAGNOSIS — M542 Cervicalgia: Secondary | ICD-10-CM | POA: Diagnosis not present

## 2021-06-04 DIAGNOSIS — M47812 Spondylosis without myelopathy or radiculopathy, cervical region: Secondary | ICD-10-CM | POA: Diagnosis not present

## 2021-06-04 DIAGNOSIS — S134XXA Sprain of ligaments of cervical spine, initial encounter: Secondary | ICD-10-CM | POA: Diagnosis not present

## 2021-06-04 DIAGNOSIS — S233XXA Sprain of ligaments of thoracic spine, initial encounter: Secondary | ICD-10-CM | POA: Diagnosis not present

## 2021-06-04 DIAGNOSIS — R748 Abnormal levels of other serum enzymes: Secondary | ICD-10-CM

## 2021-06-04 NOTE — Telephone Encounter (Signed)
Pt wife [facility]) states that he has bilateral rash on ankle. No pain, no blisters and no drainage. Really red when patient came out of shower. Pt wife states that swelling the worse. Blotchy red and scaly. Pt wife put lotion on pt leg Saturday but rash showed up today. Pt wife will be sending pictures via mychart.

## 2021-06-04 NOTE — Telephone Encounter (Signed)
I appreciate the pictures  I would recommend a combination of skin moisturizer lotion once or twice daily  May also utilize thin amount of triamcinolone 0.1% apply twice daily as needed, number 30 g with 3 refills, do this over the next week

## 2021-06-04 NOTE — Telephone Encounter (Signed)
Glad that the majority of his swelling has gone down Please inquire regarding the rash Blistery?  Draining?  Painful? The patient can send [place] pictures via MyChart or if he is not able to do so and he is comfortable with sending me a picture via text could do so-he has my personal number.  I can look at the rash and then call him later today thank you

## 2021-06-04 NOTE — Telephone Encounter (Signed)
Patient  wanting you to know legs are better but ankles are still swollen but now has a rash on them. Please advise

## 2021-06-04 NOTE — Telephone Encounter (Signed)
Nutrition Follow-up:  Patient completed neoadjuvant FOLFIRINOX for pancreatic cancer. He is s/p Whipple at [facility] with Dr. Basilia Jumbo on 12/27.  Spoke with wife of patient this morning. She reports patient is healing well from surgery. Patient having neck pain the past couple of days. Wife reports this is likely due to using upper body strength more recently. Wife reports patient is being closely followed by PCP since discharging home. She is appreciative of care he is receiving.  Patient has a poor appetite, wife reports this reminds her of appetite during "the chemo days." He has started taking Megace again as advised by PCP, unsure if this has done much for him though. Patient is taking Creon with meals, wife reports diarrhea has improved. He was recently seen by PCP for lower extremity swelling. Wife reports noticing a rash, raised with bumps around his ankles during telephone visit. Patient denies itching or pain. Wife was instructed to contact PCP office. She is agreeable.   Medications: Creon, Megace, Synthroid  Labs: 1/12 - total protein 5.8, albumin 2.4, AST 221, ALT 95, Alkaline Phos 140, Na 134, K 3.3, glucose 168  Anthropometrics: Last weight 192 lb 9.6 oz on 1/12 (lower extremity edema noted per PCP)  12/12 - 202 lb 9.6 oz 12/2 - 195 lb  11/21 - 194 lb 6.4 oz 11/9 - 191 lb 3.2 oz    NUTRITION DIAGNOSIS: Unintentional weight loss likely ongoing   INTERVENTION:  Encouraged small frequent meals and snacks frequently throughout the day - will mail diet and nutrition after whipple handout  Discussed importance of protein for healing - offered lean protein suggestions  Continue taking Creon as prescribed - will mail handout Continue taking appetite stimulant as prescribed Wife is contacting PCP regarding new lower extremity rash Patient has contact information     MONITORING, EVALUATION, GOAL: weight trends, intake    NEXT VISIT: To be scheduled

## 2021-06-05 MED ORDER — TRIAMCINOLONE ACETONIDE 0.1 % EX CREA: TOPICAL_CREAM | CUTANEOUS | 3 refills | Status: DC

## 2021-06-05 NOTE — Telephone Encounter (Signed)
Please see my chart message and

## 2021-06-07 DIAGNOSIS — M47812 Spondylosis without myelopathy or radiculopathy, cervical region: Secondary | ICD-10-CM | POA: Diagnosis not present

## 2021-06-07 DIAGNOSIS — R748 Abnormal levels of other serum enzymes: Secondary | ICD-10-CM | POA: Diagnosis not present

## 2021-06-07 DIAGNOSIS — S134XXA Sprain of ligaments of cervical spine, initial encounter: Secondary | ICD-10-CM | POA: Diagnosis not present

## 2021-06-07 DIAGNOSIS — M542 Cervicalgia: Secondary | ICD-10-CM | POA: Diagnosis not present

## 2021-06-07 DIAGNOSIS — S233XXA Sprain of ligaments of thoracic spine, initial encounter: Secondary | ICD-10-CM | POA: Diagnosis not present

## 2021-06-08 LAB — HEPATIC FUNCTION PANEL
ALT: 94 IU/L — ABNORMAL HIGH (ref 0–44)
AST: 42 IU/L — ABNORMAL HIGH (ref 0–40)
Albumin: 2.5 g/dL — ABNORMAL LOW (ref 3.8–4.8)
Alkaline Phosphatase: 192 IU/L — ABNORMAL HIGH (ref 44–121)
Bilirubin Total: 0.4 mg/dL (ref 0.0–1.2)
Bilirubin, Direct: 0.17 mg/dL (ref 0.00–0.40)
Total Protein: 5.6 g/dL — ABNORMAL LOW (ref 6.0–8.5)

## 2021-06-11 DIAGNOSIS — S233XXA Sprain of ligaments of thoracic spine, initial encounter: Secondary | ICD-10-CM | POA: Diagnosis not present

## 2021-06-11 DIAGNOSIS — M47812 Spondylosis without myelopathy or radiculopathy, cervical region: Secondary | ICD-10-CM | POA: Diagnosis not present

## 2021-06-11 DIAGNOSIS — S134XXA Sprain of ligaments of cervical spine, initial encounter: Secondary | ICD-10-CM | POA: Diagnosis not present

## 2021-06-11 DIAGNOSIS — M542 Cervicalgia: Secondary | ICD-10-CM | POA: Diagnosis not present

## 2021-06-13 NOTE — Telephone Encounter (Signed)
Patient is going to sign release to try to locate copy of sleep study form over 20 years ago from [facility] records.   Patient wants to know if he needs a follow up any time soon- was seen recently for hospital follow up

## 2021-06-13 NOTE — Telephone Encounter (Signed)
Front-please request paper record from [facility] [place] we need to look through his chart to find most recent sleep study

## 2021-06-14 DIAGNOSIS — S134XXA Sprain of ligaments of cervical spine, initial encounter: Secondary | ICD-10-CM | POA: Diagnosis not present

## 2021-06-14 DIAGNOSIS — S233XXA Sprain of ligaments of thoracic spine, initial encounter: Secondary | ICD-10-CM | POA: Diagnosis not present

## 2021-06-14 DIAGNOSIS — M47812 Spondylosis without myelopathy or radiculopathy, cervical region: Secondary | ICD-10-CM | POA: Diagnosis not present

## 2021-06-14 DIAGNOSIS — C250 Malignant neoplasm of head of pancreas: Secondary | ICD-10-CM | POA: Diagnosis not present

## 2021-06-14 DIAGNOSIS — M542 Cervicalgia: Secondary | ICD-10-CM | POA: Diagnosis not present

## 2021-06-15 NOTE — Telephone Encounter (Signed)
[facility] doesn't have a chart on this patient.

## 2021-06-15 NOTE — Telephone Encounter (Signed)
1.  I think it is best that for getting a copy of the sleep study would be through [facility] medical records #2 in some cases a new sleep study would have to be done in order to get a machine if the old one cannot be located #3 we connected with the facility that is holding paperwork towards used to be at our office and they state they do not have a paper record-obviously this is an issue #4 please have [place] [place] ask [facility] to look again.  This patient did have a paper record and it was sent there along with the other thousands of paper records from our office.  I recommend you should have this. #5 please explain situation to the patient (regarding that no longer have the paper records here that they were sent via [facility] to [facility] and now [facility] is stating that they do not have that chart-we are asking them to look again #6 I would like to do a follow-up within 10 days for the patient please move his appointment up

## 2021-06-18 NOTE — Telephone Encounter (Signed)
New order sent to [facility] per their request. [facility] is going to search again for the patient's chart

## 2021-06-20 ENCOUNTER — Inpatient Hospital Stay: Payer: BC Managed Care – PPO | Admitting: Family Medicine

## 2021-06-21 NOTE — Telephone Encounter (Signed)
Office note sent to [facility] [place].  Caryl Pina at [employer] stated she will call the insurance to try to get the new CPAP authorized

## 2021-06-26 ENCOUNTER — Inpatient Hospital Stay (HOSPITAL_COMMUNITY): Payer: BC Managed Care – PPO | Attending: Hematology

## 2021-06-26 ENCOUNTER — Inpatient Hospital Stay (HOSPITAL_BASED_OUTPATIENT_CLINIC_OR_DEPARTMENT_OTHER): Payer: BC Managed Care – PPO | Admitting: Hematology

## 2021-06-26 ENCOUNTER — Other Ambulatory Visit: Payer: Self-pay

## 2021-06-26 VITALS — BP 116/76 | HR 109 | Temp 99.7°F | Resp 16 | Wt 185.6 lb

## 2021-06-26 DIAGNOSIS — Z79899 Other long term (current) drug therapy: Secondary | ICD-10-CM | POA: Insufficient documentation

## 2021-06-26 DIAGNOSIS — M7989 Other specified soft tissue disorders: Secondary | ICD-10-CM | POA: Insufficient documentation

## 2021-06-26 DIAGNOSIS — Z794 Long term (current) use of insulin: Secondary | ICD-10-CM | POA: Diagnosis not present

## 2021-06-26 DIAGNOSIS — C259 Malignant neoplasm of pancreas, unspecified: Secondary | ICD-10-CM | POA: Diagnosis not present

## 2021-06-26 DIAGNOSIS — E119 Type 2 diabetes mellitus without complications: Secondary | ICD-10-CM | POA: Diagnosis not present

## 2021-06-26 DIAGNOSIS — E785 Hyperlipidemia, unspecified: Secondary | ICD-10-CM | POA: Diagnosis not present

## 2021-06-26 DIAGNOSIS — Z9049 Acquired absence of other specified parts of digestive tract: Secondary | ICD-10-CM | POA: Diagnosis not present

## 2021-06-26 DIAGNOSIS — Z888 Allergy status to other drugs, medicaments and biological substances status: Secondary | ICD-10-CM | POA: Diagnosis not present

## 2021-06-26 DIAGNOSIS — M2550 Pain in unspecified joint: Secondary | ICD-10-CM | POA: Diagnosis not present

## 2021-06-26 DIAGNOSIS — R5383 Other fatigue: Secondary | ICD-10-CM | POA: Insufficient documentation

## 2021-06-26 DIAGNOSIS — C250 Malignant neoplasm of head of pancreas: Secondary | ICD-10-CM | POA: Insufficient documentation

## 2021-06-26 DIAGNOSIS — Z90411 Acquired partial absence of pancreas: Secondary | ICD-10-CM | POA: Insufficient documentation

## 2021-06-26 DIAGNOSIS — I10 Essential (primary) hypertension: Secondary | ICD-10-CM | POA: Insufficient documentation

## 2021-06-26 DIAGNOSIS — F17210 Nicotine dependence, cigarettes, uncomplicated: Secondary | ICD-10-CM | POA: Insufficient documentation

## 2021-06-26 DIAGNOSIS — I700 Atherosclerosis of aorta: Secondary | ICD-10-CM | POA: Insufficient documentation

## 2021-06-26 DIAGNOSIS — I2510 Atherosclerotic heart disease of native coronary artery without angina pectoris: Secondary | ICD-10-CM | POA: Diagnosis not present

## 2021-06-26 DIAGNOSIS — Z8249 Family history of ischemic heart disease and other diseases of the circulatory system: Secondary | ICD-10-CM | POA: Insufficient documentation

## 2021-06-26 DIAGNOSIS — R200 Anesthesia of skin: Secondary | ICD-10-CM | POA: Insufficient documentation

## 2021-06-26 DIAGNOSIS — J432 Centrilobular emphysema: Secondary | ICD-10-CM | POA: Insufficient documentation

## 2021-06-26 DIAGNOSIS — Z806 Family history of leukemia: Secondary | ICD-10-CM | POA: Diagnosis not present

## 2021-06-26 DIAGNOSIS — K760 Fatty (change of) liver, not elsewhere classified: Secondary | ICD-10-CM | POA: Insufficient documentation

## 2021-06-26 DIAGNOSIS — G4730 Sleep apnea, unspecified: Secondary | ICD-10-CM | POA: Insufficient documentation

## 2021-06-26 DIAGNOSIS — R21 Rash and other nonspecific skin eruption: Secondary | ICD-10-CM | POA: Insufficient documentation

## 2021-06-26 LAB — COMPREHENSIVE METABOLIC PANEL
ALT: 16 U/L (ref 0–44)
AST: 16 U/L (ref 15–41)
Albumin: 2.7 g/dL — ABNORMAL LOW (ref 3.5–5.0)
Alkaline Phosphatase: 129 U/L — ABNORMAL HIGH (ref 38–126)
Anion gap: 8 (ref 5–15)
BUN: 13 mg/dL (ref 8–23)
CO2: 25 mmol/L (ref 22–32)
Calcium: 8.4 mg/dL — ABNORMAL LOW (ref 8.9–10.3)
Chloride: 105 mmol/L (ref 98–111)
Creatinine, Ser: 0.8 mg/dL (ref 0.61–1.24)
GFR, Estimated: >60 mL/min (ref >60)
Glucose, Bld: 120 mg/dL — ABNORMAL HIGH (ref 70–99)
Potassium: 4 mmol/L (ref 3.5–5.1)
Sodium: 138 mmol/L (ref 135–145)
Total Bilirubin: 0.4 mg/dL (ref 0.3–1.2)
Total Protein: 6.7 g/dL (ref 6.5–8.1)

## 2021-06-26 LAB — CBC WITH DIFFERENTIAL/PLATELET
Abs Immature Granulocytes: 0.03 10*3/uL (ref 0.00–0.07)
Basophils Absolute: 0.1 10*3/uL (ref 0.0–0.1)
Basophils Relative: 1 %
Eosinophils Absolute: 0.1 10*3/uL (ref 0.0–0.5)
Eosinophils Relative: 1 %
HCT: 39.6 % (ref 39.0–52.0)
Hemoglobin: 12.2 g/dL — ABNORMAL LOW (ref 13.0–17.0)
Immature Granulocytes: 0 %
Lymphocytes Relative: 18 %
Lymphs Abs: 1.6 10*3/uL (ref 0.7–4.0)
MCH: 29.8 pg (ref 26.0–34.0)
MCHC: 30.8 g/dL (ref 30.0–36.0)
MCV: 96.8 fL (ref 80.0–100.0)
Monocytes Absolute: 1 10*3/uL (ref 0.1–1.0)
Monocytes Relative: 11 %
Neutro Abs: 5.8 10*3/uL (ref 1.7–7.7)
Neutrophils Relative %: 69 %
Platelets: 448 10*3/uL — ABNORMAL HIGH (ref 150–400)
RBC: 4.09 MIL/uL — ABNORMAL LOW (ref 4.22–5.81)
RDW: 14.1 % (ref 11.5–15.5)
WBC: 8.6 10*3/uL (ref 4.0–10.5)
nRBC: 0 % (ref 0.0–0.2)

## 2021-06-26 LAB — MAGNESIUM: Magnesium: 1.6 mg/dL — ABNORMAL LOW (ref 1.7–2.4)

## 2021-06-26 LAB — URIC ACID: Uric Acid, Serum: 5.1 mg/dL (ref 3.7–8.6)

## 2021-06-26 NOTE — Progress Notes (Signed)
[place],  [address]   CLINIC:  Medical Oncology/Hematology  PCP:  [name], MD [address] [place] / [place] [address] [phone]   REASON FOR VISIT:  Follow-up for pancreatic adenocarcinoma  PRIOR THERAPY: none  NGS Results: not done  CURRENT THERAPY: FOLFIRINOX every 2 weeks x 4 cycles  BRIEF ONCOLOGIC HISTORY:  Oncology History  Pancreatic adenocarcinoma ([place])  12/04/2020 Initial Diagnosis   Pancreatic adenocarcinoma ([place])   01/10/2021 -  Chemotherapy   Patient is on Treatment Plan : PANCREAS Modified FOLFIRINOX q14d x 4 cycles      Genetic Testing   [facility] CancerNext-Expanded Panel+RNA was Negative. Of note, a variant of uncertain significance was detected in the BRCA1 gene. Report date is 04/23/2021.  The CancerNext-Expanded gene panel offered by [facility] and includes sequencing, rearrangement, and RNA analysis for the following 77 genes: AIP, ALK, APC, ATM, AXIN2, BAP1, BARD1, BLM, BMPR1A, BRCA1, BRCA2, BRIP1, CDC73, CDH1, CDK4, CDKN1B, CDKN2A, CHEK2, CTNNA1, DICER1, FANCC, FH, FLCN, GALNT12, KIF1B, LZTR1, MAX, MEN1, MET, MLH1, MSH2, MSH3, MSH6, MUTYH, NBN, NF1, NF2, NTHL1, PALB2, PHOX2B, PMS2, POT1, PRKAR1A, PTCH1, PTEN, RAD51C, RAD51D, RB1, RECQL, RET, SDHA, SDHAF2, SDHB, SDHC, SDHD, SMAD4, SMARCA4, SMARCB1, SMARCE1, STK11, SUFU, TMEM127, TP53, TSC1, TSC2, VHL and XRCC2 (sequencing and deletion/duplication); EGFR, EGLN1, HOXB13, KIT, MITF, PDGFRA, POLD1, and POLE (sequencing only); EPCAM and GREM1 (deletion/duplication only).      CANCER STAGING:  Cancer Staging  Pancreatic adenocarcinoma [facility]) Staging form: Exocrine Pancreas, AJCC 8th Edition - Clinical stage from 12/04/2020: Stage IB (cT2, cN0, cM0) - Unsigned   INTERVAL HISTORY:  Mr. DAMONDRE PFEIFLE, a 66 y.o. male, returns for routine follow-up of his pancreatic adenocarcinoma. Tycen was last seen on 03/28/2021.   Today he reports feeling  fair. He reports pain and swelling in his knees and wrists. He reports previous history of gout for which he is taking colchicine although he admits the medication might have expired. His appetite and eating are good. He denies nausea and vomiting. He reports occasional abdominal muscle spasms occurring without noticeable correlation; he reports the spasms are sporadic and can occur 1-2 times every 3-4 days.  He has lost 17 lbs since 04/30/21.  REVIEW OF SYSTEMS:  Review of Systems  Constitutional:  Positive for unexpected weight change (-17 lbs). Negative for appetite change and fatigue.  Cardiovascular:  Positive for leg swelling (wrists).  Gastrointestinal:  Negative for nausea and vomiting.  Musculoskeletal:  Positive for arthralgias (7/10 knees and wrist).  Skin:  Positive for rash (ankles).  Neurological:  Positive for numbness (feet).  All other systems reviewed and are negative.  PAST MEDICAL/SURGICAL HISTORY:  Past Medical History:  Diagnosis Date   Anemia, iron deficiency    Diabetes mellitus without complication ([place])    on meds   Hearing loss    Bil hearing aids   Hyperlipidemia    Hypertension    Port-A-Cath in place 12/26/2020   Prediabetes    Sleep apnea    Past Surgical History:  Procedure Laterality Date   BILIARY STENT PLACEMENT N/A 11/09/2020   Procedure: BILIARY STENT PLACEMENT - 80 F X 5 CM STENT;  Surgeon: Rogene Houston, MD;  Location: [facility];  Service: Endoscopy;  Laterality: N/A;   BILIARY STENT PLACEMENT N/A 12/27/2020   Procedure: BILIARY STENT PLACEMENT;  Surgeon: Rogene Houston, MD;  Location: [facility];  Service: Endoscopy;  Laterality: N/A;   COLONOSCOPY  ERCP N/A 11/09/2020   Procedure: ENDOSCOPIC RETROGRADE CHOLANGIOPANCREATOGRAPHY (ERCP);  Surgeon: Rogene Houston, MD;  Location: [facility];  Service: Endoscopy;  Laterality: N/A;   ERCP N/A 12/27/2020   Procedure: ENDOSCOPIC RETROGRADE CHOLANGIOPANCREATOGRAPHY (ERCP);  Surgeon: Rogene Houston, MD;   Location: [facility];  Service: Endoscopy;  Laterality: N/A;   ESOPHAGOGASTRODUODENOSCOPY (EGD) WITH PROPOFOL N/A 11/30/2020   Procedure: ESOPHAGOGASTRODUODENOSCOPY (EGD) WITH PROPOFOL;  Surgeon: Milus Banister, MD;  Location: [facility];  Service: Endoscopy;  Laterality: N/A;   EUS N/A 11/30/2020   Procedure: UPPER ENDOSCOPIC ULTRASOUND (EUS) RADIAL;  Surgeon: Milus Banister, MD;  Location: [facility];  Service: Endoscopy;  Laterality: N/A;   FINE NEEDLE ASPIRATION N/A 11/30/2020   Procedure: FINE NEEDLE ASPIRATION (FNA) LINEAR;  Surgeon: Milus Banister, MD;  Location: [facility];  Service: Endoscopy;  Laterality: N/A;   GIVENS CAPSULE STUDY     IR IMAGING GUIDED PORT INSERTION  12/18/2020   KNEE SURGERY     4 surgeries on left, 2 on right knee   SHOULDER SURGERY     right shoulder   SPHINCTEROTOMY N/A 11/09/2020   Procedure: SHORT BILIARY SPHINCTEROTOMY;  Surgeon: Rogene Houston, MD;  Location: [facility];  Service: Endoscopy;  Laterality: N/A;    SOCIAL HISTORY:  Social History   Socioeconomic History   Marital status: Married    Spouse name: Not on file   Number of children: Not on file   Years of education: Not on file   Highest education level: Not on file  Occupational History   Not on file  Tobacco Use   Smoking status: Every Day    Types: Cigars   Smokeless tobacco: Never   Tobacco comments:    Smoke 3-4 cigars a day  Vaping Use   Vaping Use: Never used  Substance and Sexual Activity   Alcohol use: Yes    Comment: occasional   Drug use: Never   Sexual activity: Not on file  Other Topics Concern   Not on file  Social History Narrative   Not on file   Social Determinants of Health   Financial Resource Strain: Low Risk    Difficulty of Paying Living Expenses: Not hard at all  Food Insecurity: No Food Insecurity   Worried About Charity fundraiser in the Last Year: Never true   [place] in the Last Year: Never true  Transportation Needs: No  Transportation Needs   Lack of Transportation (Medical): No   Lack of Transportation (Non-Medical): No  Physical Activity: Sufficiently Active   Days of Exercise per Week: 5 days   Minutes of Exercise per Session: 30 min  Stress: Not on file  Social Connections: Not on file  Intimate Partner Violence: Not At Risk   Fear of Current or Ex-Partner: No   Emotionally Abused: No   Physically Abused: No   Sexually Abused: No    FAMILY HISTORY:  Family History  Problem Relation Age of Onset   Heart attack Father    Heart attack Paternal Grandfather    Leukemia Sister    Heart attack Brother     CURRENT MEDICATIONS:  Current Outpatient Medications  Medication Sig Dispense Refill   aspirin 325 MG tablet Take by mouth.     Continuous Blood Gluc Receiver (FREESTYLE LIBRE 2 READER) DEVI As directed 1 each 0   Continuous Blood Gluc Sensor (FREESTYLE LIBRE 2 SENSOR) MISC 1 Piece by Does not apply route every 14 (fourteen) days.  6 each 2   Glucosamine-Chondroitin (OSTEO BI-FLEX REGULAR STRENGTH PO) Take 2 tablets by mouth daily.     insulin detemir (LEVEMIR) 100 UNIT/ML FlexPen Inject 30 Units into the skin at bedtime. 15 mL 2   insulin lispro (HUMALOG KWIKPEN) 100 UNIT/ML KwikPen Inject 8-12 Units into the skin 3 (three) times daily before meals. 15 mL 2   IRON-VITAMIN C PO Take 1 tablet by mouth daily.     levothyroxine (SYNTHROID) 175 MCG tablet TAKE 1 TABLET DAILY 90 tablet 1   levothyroxine (SYNTHROID) 25 MCG tablet Take one tablet po daily 90 tablet 0   lidocaine-prilocaine (EMLA) cream Apply a small amount to port a cath site (do not rub in) and cover with plastic wrap 1 hour prior to infusion appointments 30 g 3   lipase/protease/amylase (CREON) 36000 UNITS CPEP capsule Take 2 capsules (72,000 Units total) by mouth 3 (three) times daily with meals. May also take 1 capsule (36,000 Units total) as needed (with snacks). 240 capsule 11   oxyCODONE-acetaminophen (PERCOCET/ROXICET) 5-325 MG  tablet Take 1 tablet by mouth every 6 (six) hours as needed for severe pain. 20 tablet 0   sildenafil (VIAGRA) 100 MG tablet 50 mg as needed.     triamcinolone cream (KENALOG) 0.1 % Apply to affected areas BID prn 30 g 3   fluorouracil CALGB 58527 2,400 mg/m2 in sodium chloride 0.9 % 150 mL Inject 2,400 mg/m2 into the vein over 48 hr. Every 14 days     IRINOTECAN HCL IV Inject 150 mg/m2 into the vein every 14 (fourteen) days.     LEUCOVORIN CALCIUM IV Inject 400 mg/m2 into the vein every 14 (fourteen) days.     megestrol (MEGACE) 400 MG/10ML suspension Take 10 mLs (400 mg total) by mouth 2 (two) times daily. 240 mL 0   ondansetron (ZOFRAN) 8 MG tablet Take 1 tablet (8 mg total) by mouth 2 (two) times daily as needed. Start on day 3 after chemotherapy. 30 tablet 1   OXALIPLATIN IV Inject 85 mg/m2 into the vein every 14 (fourteen) days.     prochlorperazine (COMPAZINE) 10 MG tablet Take 10 mg by mouth every 6 (six) hours as needed for nausea or vomiting.     No current facility-administered medications for this visit.   Facility-Administered Medications Ordered in Other Visits  Medication Dose Route Frequency Provider Last Rate Last Admin   alteplase (CATHFLO ACTIVASE) injection 2 mg  2 mg Intracatheter Once PRN Derek Jack, MD       heparin lock flush 100 unit/mL  250 Units Intracatheter Once PRN Derek Jack, MD       sodium chloride flush (NS) 0.9 % injection 10 mL  10 mL Intracatheter PRN Derek Jack, MD   10 mL at 01/26/21 1314   sodium chloride flush (NS) 0.9 % injection 3 mL  3 mL Intracatheter PRN Derek Jack, MD        ALLERGIES:  Allergies  Allergen Reactions   Lisinopril Cough    PHYSICAL EXAM:  Performance status (ECOG): 1 - Symptomatic but completely ambulatory  Vitals:   06/26/21 0952  BP: 116/76  Pulse: (!) 109  Resp: 16  Temp: 99.7 F (37.6 C)  SpO2: 97%   Wt Readings from Last 3 Encounters:  06/26/21 185 lb 9.6 oz (84.2 kg)   05/31/21 192 lb 9.6 oz (87.4 kg)  04/30/21 202 lb 9.6 oz (91.9 kg)   Physical Exam Vitals reviewed.  Constitutional:      Appearance: Normal appearance.  Cardiovascular:     Rate and Rhythm: Normal rate and regular rhythm.     Pulses: Normal pulses.     Heart sounds: Normal heart sounds.  Pulmonary:     Effort: Pulmonary effort is normal.     Breath sounds: Normal breath sounds.  Abdominal:     General: A surgical scar is present.     Palpations: Abdomen is soft. There is no hepatomegaly, splenomegaly or mass.     Tenderness: There is no abdominal tenderness.  Musculoskeletal:     Right lower leg: 1+ Edema present.     Left lower leg: 1+ Edema present.  Neurological:     General: No focal deficit present.     Mental Status: He is alert and oriented to person, place, and time.  Psychiatric:        Mood and Affect: Mood normal.        Behavior: Behavior normal.     LABORATORY DATA:  I have reviewed the labs as listed.  CBC Latest Ref Rng & Units 06/26/2021 04/30/2021 03/28/2021  WBC 4.0 - 10.5 K/uL 8.6 3.2(L) 15.1(H)  Hemoglobin 13.0 - 17.0 g/dL 12.2(L) 13.6 11.7(L)  Hematocrit 39.0 - 52.0 % 39.6 41.0 35.1(L)  Platelets 150 - 400 K/uL 448(H) 190 324   CMP Latest Ref Rng & Units 06/26/2021 06/07/2021 05/31/2021  Glucose 70 - 99 mg/dL 120(H) - 168(H)  BUN 8 - 23 mg/dL 13 - 20  Creatinine 0.61 - 1.24 mg/dL 0.80 - 1.01  Sodium 135 - 145 mmol/L 138 - 134(L)  Potassium 3.5 - 5.1 mmol/L 4.0 - 3.3(L)  Chloride 98 - 111 mmol/L 105 - 102  CO2 22 - 32 mmol/L 25 - 24  Calcium 8.9 - 10.3 mg/dL 8.4(L) - 7.7(L)  Total Protein 6.5 - 8.1 g/dL 6.7 5.6(L) 5.8(L)  Total Bilirubin 0.3 - 1.2 mg/dL 0.4 0.4 0.3  Alkaline Phos 38 - 126 U/L 129(H) 192(H) 140(H)  AST 15 - 41 U/L 16 42(H) 221(H)  ALT 0 - 44 U/L 16 94(H) 95(H)    DIAGNOSTIC IMAGING:  I have independently reviewed the scans and discussed with the patient. US Venous Img Lower Bilateral  Result Date: 05/31/2021 CLINICAL DATA:   Bilateral Edema EXAM: BILATERAL LOWER EXTREMITY VENOUS DOPPLER ULTRASOUND TECHNIQUE: Gray-scale sonography with graded compression, as well as color Doppler and duplex ultrasound were performed to evaluate the lower extremity deep venous systems from the level of the common femoral vein and including the common femoral, femoral, profunda femoral, popliteal and calf veins including the posterior tibial, peroneal and gastrocnemius veins when visible. The superficial great saphenous vein was also interrogated. Spectral Doppler was utilized to evaluate flow at rest and with distal augmentation maneuvers in the common femoral, femoral and popliteal veins. COMPARISON:  None. FINDINGS: RIGHT LOWER EXTREMITY Common Femoral Vein: No evidence of thrombus. Normal compressibility, respiratory phasicity and response to augmentation. Saphenofemoral Junction: No evidence of thrombus. Normal compressibility and flow on color Doppler imaging. Profunda Femoral Vein: No evidence of thrombus. Normal compressibility and flow on color Doppler imaging. Femoral Vein: No evidence of thrombus. Normal compressibility, respiratory phasicity and response to augmentation. Popliteal Vein: No evidence of thrombus. Normal compressibility, respiratory phasicity and response to augmentation. Calf Veins: No evidence of thrombus. Normal compressibility and flow on color Doppler imaging. LEFT LOWER EXTREMITY Common Femoral Vein: No evidence of thrombus. Normal compressibility, respiratory phasicity and response to augmentation. Saphenofemoral Junction: No evidence of thrombus. Normal compressibility and flow on color Doppler imaging. Profunda Femoral  Vein: No evidence of thrombus. Normal compressibility and flow on color Doppler imaging. Femoral Vein: No evidence of thrombus. Normal compressibility, respiratory phasicity and response to augmentation. Popliteal Vein: No evidence of thrombus. Normal compressibility, respiratory phasicity and response to  augmentation. Calf Veins: No evidence of thrombus. Normal compressibility and flow on color Doppler imaging. Other Findings:  None. IMPRESSION: No evidence of deep venous thrombosis in either lower extremity. Electronically Signed   By: Albin Felling M.D.   On: 05/31/2021 13:27     ASSESSMENT:  1.  Stage I (T2N0) pancreatic adenocarcinoma: - Recent admission to the hospital with DKA, found to have elevated liver enzymes. - MRCP without contrast on 11/08/2020 showed 2.3 x 2 cm central pancreatic head and uncinate mass with intra and extrahepatic biliary ductal dilation. - 11/09/2020-ERCP sphincterotomy and stenting with findings showing short high-grade distal CBD stricture with dilated CBD up to 14 mm.  Bile duct brushing was negative for malignant cells. - 11/10/2020-CA 19.9-367. - ERCP/EUS by [name] on 11/30/2020 with 2.9 cm mass in the head of the pancreas with unclear outer borders.  Given significant dilatation, indistinct borders, unable to confidently assess major vascular involvement.  Abutment and probable invasion of SMV and portal vein near the confluence with splenic vein.  No obvious SMA or celiac artery involvement.  No other adenopathy. - FNA of the pancreatic mass consistent with adenocarcinoma. - Weight loss intentional 25 pounds between January and April 1.  Additional 25 pound weight loss since 1 April, unintentional. - CT [facility] pancreatic protocol on 12/05/2020 with ill-defined mass in the pancreatic head and uncinate process measuring up to 1.9 x 2.1 cm with associated pancreatic ductal dilatation up to 8 mm.  No enlarged adenopathy.  Teardrop morphology of SMV indicative of SMV involvement.  Replaced right hepatic arterial supply arises from the SMA passing immediately posterior to the tumor. - PET scan on 12/14/2020 with mass in the pancreatic head with hypermetabolic features, SUV 5.3 with no sign of metastatic disease. - 5 cycles of neoadjuvant FOLFIRINOX from 01/10/2021 through  03/14/2021. - Whipple's procedure with IRE by Dr.Ianniti on 05/15/2021. - Pathology consistent with grade 2 moderate adenocarcinoma, 3.7 cm, absent treatment effect, no evidence of tumor regression, margins positive, 6/36 nodes positive, [id number].  2.  Social/family history: - He lives at home with his wife and is independent of all ADLs and IADLs. - He currently works in [employer].  No history of chemical exposure. - He previously smoked cigarettes.  Currently smokes cigars 4/day. - Maternal grandmother died at age 45 with malignancy, unknown type. - Sister died of acute leukemia.   PLAN:  1.  YpT2pN2 pancreatic adenocarcinoma: - I have reviewed records from [facility].  I have also talked to the surgical team. - He is recovering well from Whipple surgery.  He is able to eat well.  He has gained some weight. - Reviewed pathology reports with the patient and his wife. - Reviewed labs today which showed almond 2.7 and alk phos 129.  Other LFTs are normal.  CBC was grossly normal with mild anemia. - CA 19-9 is pending. - I have recommended restaging CT CAP with contrast with special attention that he underwent irreversible electroporation (IRE) with Whipple's procedure, which could alter radiological findings.  We will also review scans with his surgeon in [place]. - Recommend chemoradiation therapy with Xeloda for positive margins. - Also talked to him about adjuvant chemotherapy with gemcitabine and capecitabine as he did not get any response  with FOLFIRINOX in the neoadjuvant setting.  2.  Weight loss: - He has gained about 10 pounds since December beginning.  He is able to eat well.  3.  Hypomagnesemia: - Magnesium today is 1.6.  He was recommended to start magnesium 1 tablet daily.    Orders placed this encounter:  Orders Placed This Encounter  Procedures   CT CHEST ABDOMEN PELVIS W CONTRAST   Uric acid     Derek Jack, MD [place] [phone]   I, Thana Ates, am acting as a scribe for Dr. Derek Jack.  I, Derek Jack MD, have reviewed the above documentation for accuracy and completeness, and I agree with the above.

## 2021-06-26 NOTE — Patient Instructions (Addendum)
[place] [facility] Discharge Instructions   You were seen and examined today by [name].  He reviewed the results of your lab work which is normal/stable.  You magnesium is slightly low.  Take 1 magnesium pill daily. We will also add on a uric acid test to the blood work done today.  If the results are elevated we will send you a medication to help with that.   Dr. Raliegh Ip discussed post operative treatment with radiation therapy and chemotherapy due to having positive margins after removal of the tumor.   He discussed chemotherapy treatment with Gemzar (gemcitabine) which is given in the clinic.  You would also take chemotherapy pills called Xeloda.  We will discuss this with you further after your CT scans.   Return as scheduled after CT scan to discuss results.   Thank you for choosing [place] at [facility] to provide your oncology and hematology care.  To afford each patient quality time with our provider, please arrive at least 15 minutes before your scheduled appointment time.   If you have a lab appointment with the [facility] please come in thru the Main Entrance and check in at the main information desk.  You need to re-schedule your appointment should you arrive 10 or more minutes late.  We strive to give you quality time with our providers, and arriving late affects you and other patients whose appointments are after yours.  Also, if you no show three or more times for appointments you may be dismissed from the clinic at the providers discretion.     Again, thank you for choosing [facility].  Our hope is that these requests will decrease the amount of time that you wait before being seen by our physicians.       _____________________________________________________________  Should you have questions after your visit to [facility], please contact our office at [phone] and follow the  prompts.  Our office hours are 8:00 a.m. and 4:30 p.m. Monday - Friday.  Please note that voicemails left after 4:00 p.m. may not be returned until the following business day.  We are closed weekends and major holidays.  You do have access to a nurse 24-7, just call the main number to the clinic [phone] and do not press any options, hold on the line and a nurse will answer the phone.    For prescription refill requests, have your pharmacy contact our office and allow 72 hours.    Due to Covid, you will need to wear a mask upon entering the hospital. If you do not have a mask, a mask will be given to you at the Main Entrance upon arrival. For doctor visits, patients may have 1 support person age 74 or older with them. For treatment visits, patients can not have anyone with them due to social distancing guidelines and our immunocompromised population.

## 2021-06-27 LAB — CANCER ANTIGEN 19-9: CA 19-9: 64 U/mL — ABNORMAL HIGH (ref 0–35)

## 2021-07-03 ENCOUNTER — Other Ambulatory Visit: Payer: Self-pay

## 2021-07-03 ENCOUNTER — Ambulatory Visit (HOSPITAL_BASED_OUTPATIENT_CLINIC_OR_DEPARTMENT_OTHER)
Admission: RE | Admit: 2021-07-03 | Discharge: 2021-07-03 | Disposition: A | Discharge status: Home / Self Care | Payer: BC Managed Care – PPO | Source: Ambulatory Visit | Attending: Hematology | Admitting: Hematology

## 2021-07-03 DIAGNOSIS — N289 Disorder of kidney and ureter, unspecified: Secondary | ICD-10-CM | POA: Diagnosis not present

## 2021-07-03 DIAGNOSIS — K760 Fatty (change of) liver, not elsewhere classified: Secondary | ICD-10-CM | POA: Diagnosis not present

## 2021-07-03 DIAGNOSIS — R918 Other nonspecific abnormal finding of lung field: Secondary | ICD-10-CM | POA: Diagnosis not present

## 2021-07-03 DIAGNOSIS — J432 Centrilobular emphysema: Secondary | ICD-10-CM | POA: Diagnosis not present

## 2021-07-03 DIAGNOSIS — C259 Malignant neoplasm of pancreas, unspecified: Secondary | ICD-10-CM | POA: Diagnosis not present

## 2021-07-03 IMAGING — CT CT CHEST-ABD-PELV W/ CM
2 of 5 series · 13 of 46 positions shown, 15 images · IV contrast (APPLIED)
Comparison: [DATE]

CLINICAL DATA: Pancreatic cancer, status post Whipple procedure.
Diabetes. Hypertension.

EXAM:
CT CHEST, ABDOMEN, AND PELVIS WITH CONTRAST
TECHNIQUE: Multidetector CT imaging of the chest, abdomen and pelvis was
performed following the standard protocol during bolus
administration of intravenous contrast.

[Series 2: cap with · axial · 0.82mm/px · z∈[-250,+334]mm · 10 of 141 slices shown, 12 images]
[im 12/141  soft-tissue]
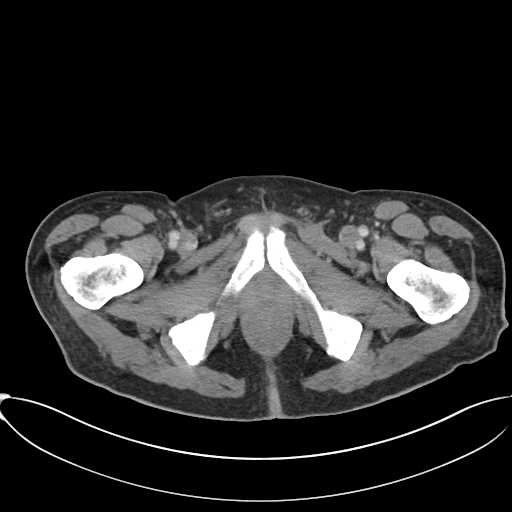
[im 12/141  bone]
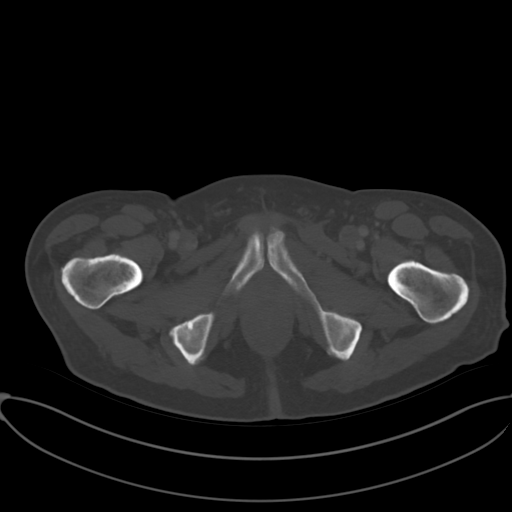
[im 24/141  soft-tissue]
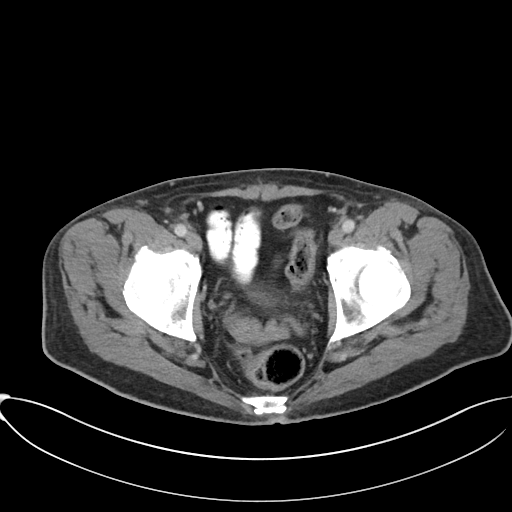
[im 36/141  soft-tissue]
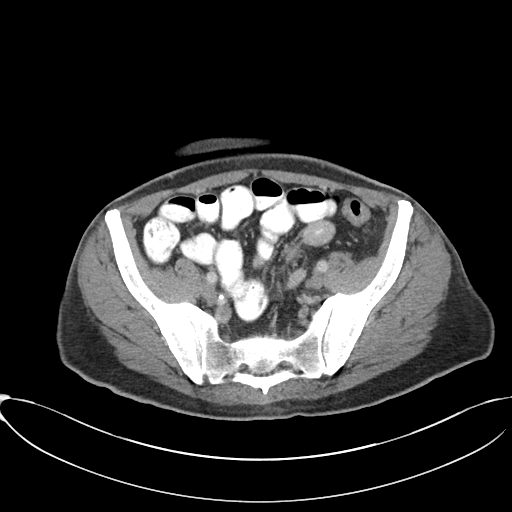
[im 47/141  soft-tissue]
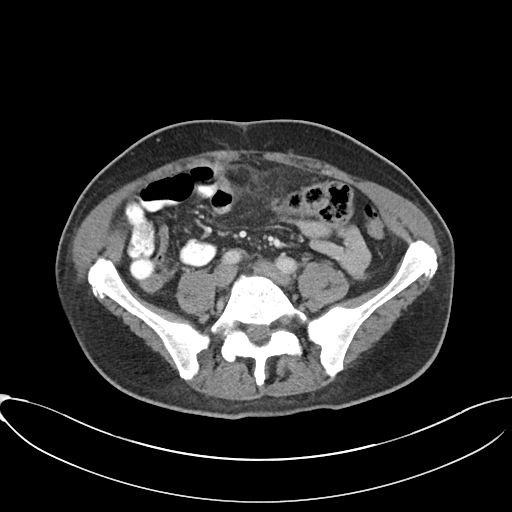
[im 59/141  soft-tissue]
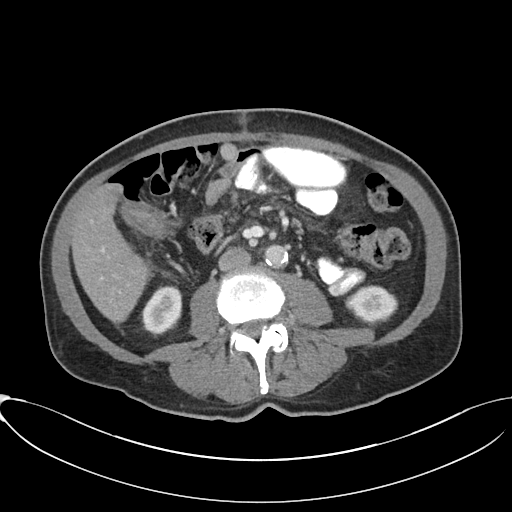
[im 82/141  soft-tissue]
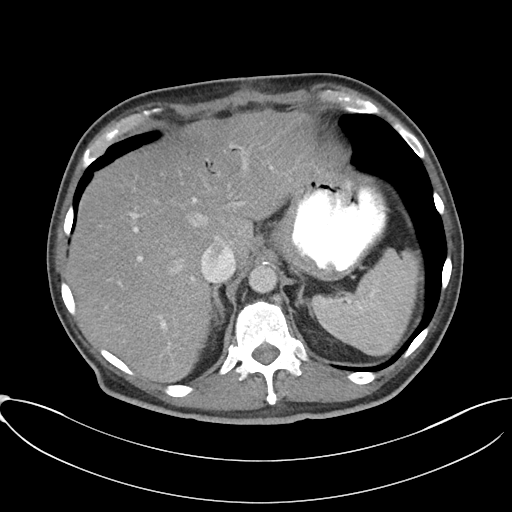
[im 94/141  soft-tissue]
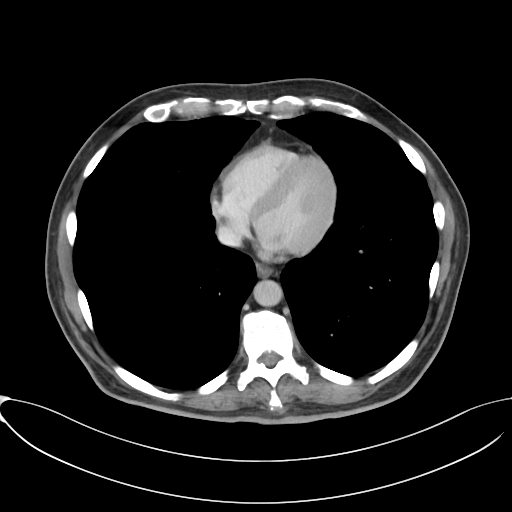
[im 106/141  soft-tissue]
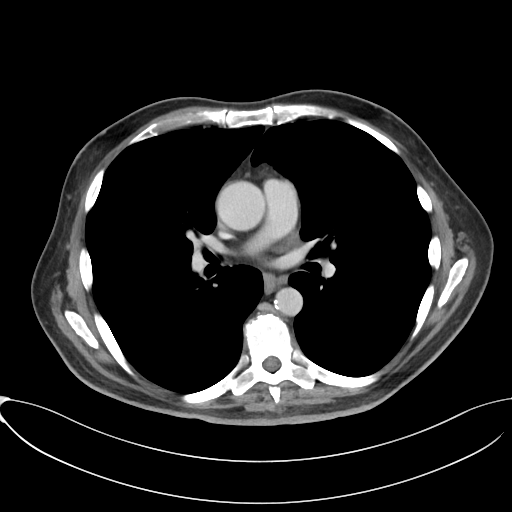
[im 117/141  soft-tissue]
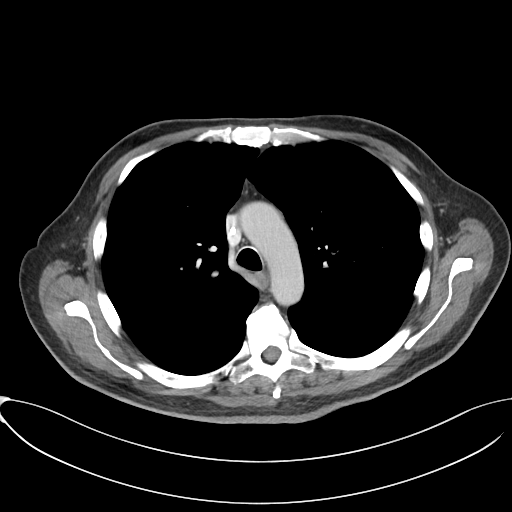
[im 117/141  bone]
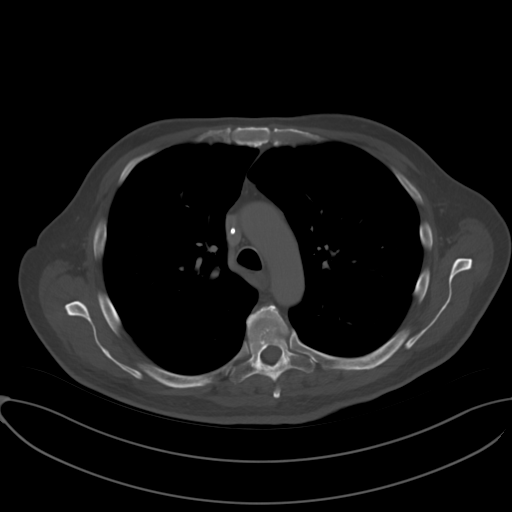
[im 129/141  soft-tissue]
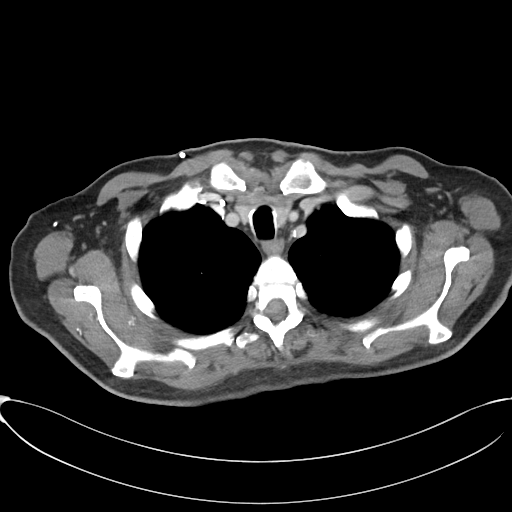

[Series 5: coronal · coronal · 0.81mm/px · 3 of 103 slices shown]
[im 35/103  soft-tissue]
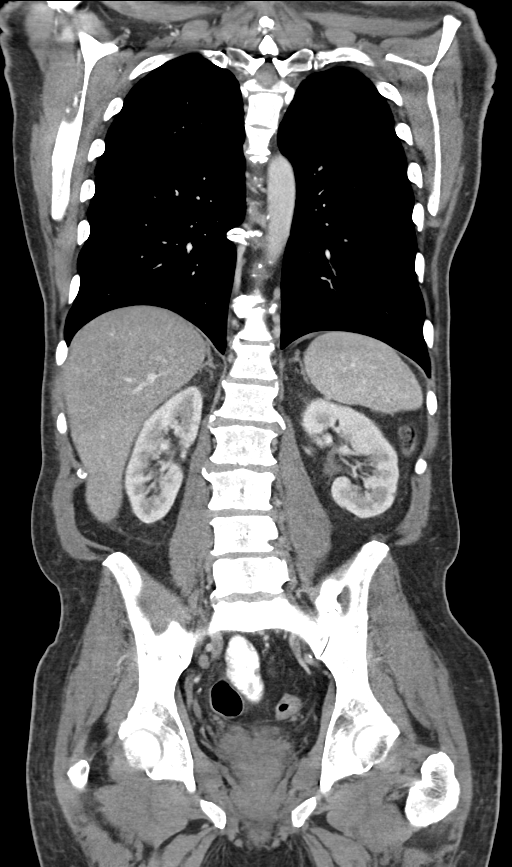
[im 46/103  soft-tissue]
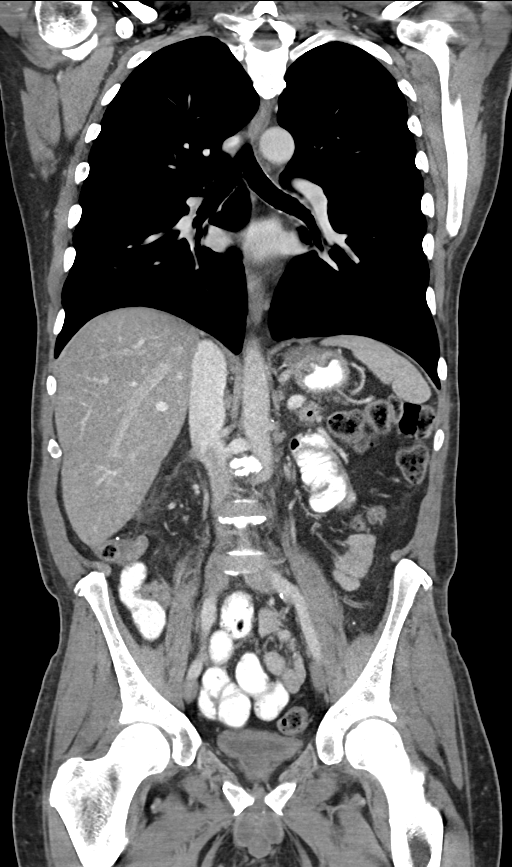
[im 57/103  soft-tissue]
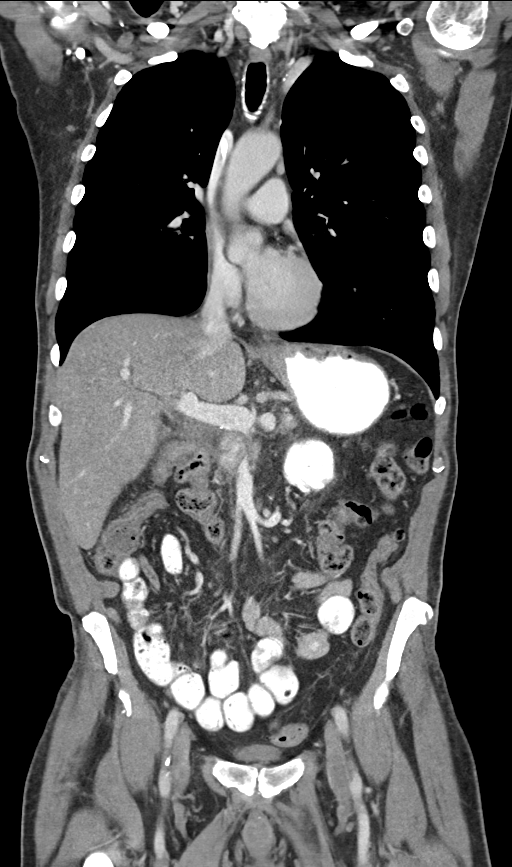

[13 of 46 positions shown; findings below may reference images not displayed]

RADIATION DOSE REDUCTION: This exam was performed according to the
departmental dose-optimization program which includes automated
exposure control, adjustment of the mA and/or kV according to
patient size and/or use of iterative reconstruction technique.

CONTRAST:  85mL OMNIPAQUE IOHEXOL 300 MG/ML  SOLN
FINDINGS: CT CHEST FINDINGS

Cardiovascular: Right Port-A-Cath tip low SVC. Aortic
atherosclerosis. Mild ascending aortic dilatation again identified,
on the order of 4.1 cm. Tortuous thoracic aorta. Normal heart size,
without pericardial effusion. Three vessel coronary artery
calcification. No central pulmonary embolism, on this non-dedicated
study.

Mediastinum/Nodes: No supraclavicular adenopathy. No mediastinal or
hilar adenopathy.

Lungs/Pleura: No pleural fluid. Mild centrilobular emphysema. Nodule
on the right minor fissure of 3 mm on 90/4 is unchanged.

A superior segment left lower lobe 2 mm subpleural pulmonary nodule
on 57/4 is likely a calcified granuloma.

Musculoskeletal: No acute osseous abnormality.

CT ABDOMEN PELVIS FINDINGS

Hepatobiliary: Moderate hepatic steatosis, without focal liver
lesion. Pneumobilia. [place]ed[place]o jejunostomy.

Pancreas: Status post pancreatic head resection. Atrophy of the
remaining pancreas with trace gas within the duct. Soft tissue
cephalad to surgical sutures including on 74/2 is favored to
represent poorly opacified jejunum. No peripancreatic edema.

Spleen: Normal in size, without focal abnormality.

Adrenals/Urinary Tract: Normal adrenal glands. Bilateral too small
to characterize renal lesions. No hydronephrosis. Normal urinary
bladder.

Stomach/Bowel: Gastrojejunostomy. Normal colon and terminal ileum.
Presumed appendectomy. Otherwise normal small bowel.

Vascular/Lymphatic: Aortic atherosclerosis. Celiac is normal. There
is interstitial thickening within the portacaval space and adjacent
the right-side of the SMA, including on 72/2. No vascular narrowing.

Portacaval node measures 8 mm on 66/2 versus 5 mm on the prior. Not
pathologic by size criteria. No pelvic sidewall adenopathy.

Reproductive: Normal prostate.

Other: No free intraperitoneal air. No significant free fluid. No
evidence of omental or peritoneal disease.

Musculoskeletal: Degenerative partial fusion of bilateral sacroiliac
joints.
IMPRESSION: 1. Status post Whipple procedure, without specific evidence of
residual or recurrent disease.
2. Interstitial thickening within the portacaval space and along the
course of the proximal SMA. Possibly treatment related. Recommend
attention on follow-up.
3.  No acute process or evidence of metastatic disease in the chest.
4. Aortic atherosclerosis ([W3]-[W3]), coronary artery
atherosclerosis and emphysema ([W3]-[W3]).
5. Mild ascending aortic dilatation at 4.1 cm. Recommend annual
imaging followup by CTA or MRA. This recommendation follows [W3]
ACCF/AHA/AATS/ACR/ASA/SCA/BRENNER/BRENNER/BRENNER/BRENNER Guidelines for the
Diagnosis and Management of Patients with Thoracic Aortic Disease.
Circulation. [W3]; 121: [id number]. Aortic aneurysm NOS ([W3]-[W3])
6. Hepatic steatosis.

## 2021-07-03 MED ORDER — IOHEXOL 300 MG/ML  SOLN
85.0000 mL | Freq: Once | INTRAMUSCULAR | Status: AC | PRN
  Administered 2021-07-03: 85 mL via INTRAVENOUS

## 2021-07-05 ENCOUNTER — Inpatient Hospital Stay (HOSPITAL_BASED_OUTPATIENT_CLINIC_OR_DEPARTMENT_OTHER): Payer: BC Managed Care – PPO | Admitting: Hematology

## 2021-07-05 ENCOUNTER — Other Ambulatory Visit: Payer: Self-pay

## 2021-07-05 VITALS — BP 108/68 | HR 106 | Temp 99.1°F | Resp 18 | Ht 75.0 in | Wt 180.3 lb

## 2021-07-05 DIAGNOSIS — M7989 Other specified soft tissue disorders: Secondary | ICD-10-CM | POA: Diagnosis not present

## 2021-07-05 DIAGNOSIS — E785 Hyperlipidemia, unspecified: Secondary | ICD-10-CM | POA: Diagnosis not present

## 2021-07-05 DIAGNOSIS — M2550 Pain in unspecified joint: Secondary | ICD-10-CM | POA: Diagnosis not present

## 2021-07-05 DIAGNOSIS — I10 Essential (primary) hypertension: Secondary | ICD-10-CM | POA: Diagnosis not present

## 2021-07-05 DIAGNOSIS — C259 Malignant neoplasm of pancreas, unspecified: Secondary | ICD-10-CM

## 2021-07-05 DIAGNOSIS — I700 Atherosclerosis of aorta: Secondary | ICD-10-CM | POA: Diagnosis not present

## 2021-07-05 DIAGNOSIS — J432 Centrilobular emphysema: Secondary | ICD-10-CM | POA: Diagnosis not present

## 2021-07-05 DIAGNOSIS — C250 Malignant neoplasm of head of pancreas: Secondary | ICD-10-CM | POA: Diagnosis not present

## 2021-07-05 DIAGNOSIS — I2510 Atherosclerotic heart disease of native coronary artery without angina pectoris: Secondary | ICD-10-CM | POA: Diagnosis not present

## 2021-07-05 DIAGNOSIS — G4730 Sleep apnea, unspecified: Secondary | ICD-10-CM | POA: Diagnosis not present

## 2021-07-05 DIAGNOSIS — R5383 Other fatigue: Secondary | ICD-10-CM | POA: Diagnosis not present

## 2021-07-05 DIAGNOSIS — R21 Rash and other nonspecific skin eruption: Secondary | ICD-10-CM | POA: Diagnosis not present

## 2021-07-05 DIAGNOSIS — K760 Fatty (change of) liver, not elsewhere classified: Secondary | ICD-10-CM | POA: Diagnosis not present

## 2021-07-05 DIAGNOSIS — R200 Anesthesia of skin: Secondary | ICD-10-CM | POA: Diagnosis not present

## 2021-07-05 DIAGNOSIS — F17210 Nicotine dependence, cigarettes, uncomplicated: Secondary | ICD-10-CM | POA: Diagnosis not present

## 2021-07-05 DIAGNOSIS — E119 Type 2 diabetes mellitus without complications: Secondary | ICD-10-CM | POA: Diagnosis not present

## 2021-07-05 MED ORDER — CAPECITABINE 500 MG PO TABS
1500.0000 mg | ORAL_TABLET | Freq: Two times a day (BID) | ORAL | 0 refills | Status: DC
  Filled 2021-07-05: qty 126, 21d supply, fill #0

## 2021-07-05 NOTE — Addendum Note (Signed)
Addended by: Derek Jack on: 07/05/2021 05:30 PM   Modules accepted: Orders

## 2021-07-05 NOTE — Patient Instructions (Addendum)
[place] at [facility] Discharge Instructions  You were seen and examined today by [name].   [name] reviewed your recent CT scan and lab work. Your CT scan does not show any signs of cancer. Your lab work is okay but there are concerns for malnutrition.  [name] has recommended an additional 4 months of treatment to complete a total of 6 months of treatment. This will consist of two medications:  - Gemcitabine (Gemzar). This is given here in the clinic on Days 1, 8 and 15 every 28 days.  - Capecitabine (Xeloda). This is a pill you will take at home twice daily 10 to 12 hours apart. You will take it for 21 days followed by 7 days off. You will start it on the first day of Gemcitabine.  Following chemotherapy, you will be referred to Radiation Oncology. During Radiation, you will continue taking Xeloda only.   Cancer that could be seen by the human eye was completely removed. The goal of this treatment is to prevent cancer recurrence.  Follow-up for start of treatment!   Thank you for choosing [place] at [facility] to provide your oncology and hematology care.  To afford each patient quality time with our provider, please arrive at least 15 minutes before your scheduled appointment time.   If you have a lab appointment with the [facility] please come in thru the Main Entrance and check in at the main information desk.  You need to re-schedule your appointment should you arrive 10 or more minutes late.  We strive to give you quality time with our providers, and arriving late affects you and other patients whose appointments are after yours.  Also, if you no show three or more times for appointments you may be dismissed from the clinic at the providers discretion.     Again, thank you for choosing [facility].  Our hope is that these requests will decrease the amount of time that you wait before being seen  by our physicians.       _____________________________________________________________  Should you have questions after your visit to [facility], please contact our office at [phone] and follow the prompts.  Our office hours are 8:00 a.m. and 4:30 p.m. Monday - Friday.  Please note that voicemails left after 4:00 p.m. may not be returned until the following business day.  We are closed weekends and major holidays.  You do have access to a nurse 24-7, just call the main number to the clinic [phone] and do not press any options, hold on the line and a nurse will answer the phone.    For prescription refill requests, have your pharmacy contact our office and allow 72 hours.    Due to Covid, you will need to wear a mask upon entering the hospital. If you do not have a mask, a mask will be given to you at the Main Entrance upon arrival. For doctor visits, patients may have 1 support person age 36 or older with them. For treatment visits, patients can not have anyone with them due to social distancing guidelines and our immunocompromised population.

## 2021-07-05 NOTE — Progress Notes (Signed)
ON PATHWAY REGIMEN - Pancreatic Adenocarcinoma  No Change  Continue With Treatment as Ordered.  Original Decision Date/Time: 12/20/2020 15:15     A cycle is every 14 days:     Oxaliplatin      Leucovorin      Irinotecan      Fluorouracil   **Always confirm dose/schedule in your pharmacy ordering system**  Patient Characteristics: Preoperative (Clinical Staging), Borderline Resectable, PS = 0,1, BRCA1/2 and PALB2 Mutation Absent/Unknown Therapeutic Status: Preoperative (Clinical Staging) AJCC T Category: cT2 AJCC N Category: cN0 Resectability Status: Borderline Resectable AJCC M Category: cM0 AJCC 8 Stage Grouping: IB ECOG Performance Status: 0 BRCA1/2 Mutation Status: Awaiting Test Results PALB2 Mutation Status: Awaiting Test Results Intent of Therapy: Curative Intent, Discussed with Patient

## 2021-07-05 NOTE — Progress Notes (Signed)
DISCONTINUE ON PATHWAY REGIMEN - Pancreatic Adenocarcinoma     A cycle is every 14 days:     Oxaliplatin      Leucovorin      Irinotecan      Fluorouracil   **Always confirm dose/schedule in your pharmacy ordering system**  REASON: Continuation Of Treatment PRIOR TREATMENT: PANOS94: mFOLFIRINOX q14 Days x 4 Cycles TREATMENT RESPONSE: Stable Disease (SD)  Pancreatic Adenocarcinoma - No Medical Intervention - Off Treatment.  Patient Characteristics: Preoperative (Clinical Staging), Borderline Resectable, PS = 0,1, BRCA1/2 and PALB2 Mutation Absent/Unknown Therapeutic Status: Preoperative (Clinical Staging) AJCC T Category: cT2 AJCC N Category: cN0 Resectability Status: Borderline Resectable AJCC M Category: cM0 AJCC 8 Stage Grouping: IB ECOG Performance Status: 0 BRCA1/2 Mutation Status: Awaiting Test Results PALB2 Mutation Status: Awaiting Test Results

## 2021-07-05 NOTE — Progress Notes (Signed)
Alexander Banks, [place] [address]   CLINIC:  Medical Oncology/Hematology  PCP:  Alexander Drown, MD [address] [place] / [place] [address] [phone]   REASON FOR VISIT:  Follow-up for pancreatic adenocarcinoma  PRIOR THERAPY: none  NGS Results: not done  CURRENT THERAPY: FOLFIRINOX every 2 weeks x 4 cycles  BRIEF ONCOLOGIC HISTORY:  Oncology History  Pancreatic adenocarcinoma ([place])  12/04/2020 Initial Diagnosis   Pancreatic adenocarcinoma ([place])   01/10/2021 -  Chemotherapy   Patient is on Treatment Plan : PANCREAS Modified FOLFIRINOX q14d x 4 cycles      Genetic Testing   [facility] CancerNext-Expanded Panel+RNA was Negative. Of note, a variant of uncertain significance was detected in the BRCA1 gene. Report date is 04/23/2021.  The CancerNext-Expanded gene panel offered by [facility] and includes sequencing, rearrangement, and RNA analysis for the following 77 genes: AIP, ALK, APC, ATM, AXIN2, BAP1, BARD1, BLM, BMPR1A, BRCA1, BRCA2, BRIP1, CDC73, CDH1, CDK4, CDKN1B, CDKN2A, CHEK2, CTNNA1, DICER1, FANCC, FH, FLCN, GALNT12, KIF1B, LZTR1, MAX, MEN1, MET, MLH1, MSH2, MSH3, MSH6, MUTYH, NBN, NF1, NF2, NTHL1, PALB2, PHOX2B, PMS2, POT1, PRKAR1A, PTCH1, PTEN, RAD51C, RAD51D, RB1, RECQL, RET, SDHA, SDHAF2, SDHB, SDHC, SDHD, SMAD4, SMARCA4, SMARCB1, SMARCE1, STK11, SUFU, TMEM127, TP53, TSC1, TSC2, VHL and XRCC2 (sequencing and deletion/duplication); EGFR, EGLN1, HOXB13, KIT, MITF, PDGFRA, POLD1, and POLE (sequencing only); EPCAM and GREM1 (deletion/duplication only).      CANCER STAGING:  Cancer Staging  Pancreatic adenocarcinoma [facility]) Staging form: Exocrine Pancreas, AJCC 8th Edition - Clinical stage from 12/04/2020: Stage IB (cT2, cN0, cM0) - Unsigned   INTERVAL HISTORY:  Alexander Banks, a 66 y.o. male, returns for routine follow-up of his pancreatic adenocarcinoma. Alexander Banks was last seen on 06/26/2020.   Today he reports feeling  good. His swelling and aching in his ankles, knees, and left hand have resolved. His appetite is poor, and he has lost 5 lbs since his last visit. He is taking Marinol BID which has not helped, and he was unable to tolerate Megace as he reports it caused him to gag. He denies pains after eating.   REVIEW OF SYSTEMS:  Review of Systems  Constitutional:  Positive for appetite change and unexpected weight change (-5 lbs). Negative for fatigue.  Neurological:  Positive for numbness.  All other systems reviewed and are negative.  PAST MEDICAL/SURGICAL HISTORY:  Past Medical History:  Diagnosis Date   Anemia, iron deficiency    Diabetes mellitus without complication ([place])    on meds   Hearing loss    Bil hearing aids   Hyperlipidemia    Hypertension    Port-A-Cath in place 12/26/2020   Prediabetes    Sleep apnea    Past Surgical History:  Procedure Laterality Date   BILIARY STENT PLACEMENT N/A 11/09/2020   Procedure: BILIARY STENT PLACEMENT - 38 F X 5 CM STENT;  Surgeon: Alexander Houston, MD;  Location: [facility];  Service: Endoscopy;  Laterality: N/A;   BILIARY STENT PLACEMENT N/A 12/27/2020   Procedure: BILIARY STENT PLACEMENT;  Surgeon: Alexander Houston, MD;  Location: [facility];  Service: Endoscopy;  Laterality: N/A;   COLONOSCOPY     ERCP N/A 11/09/2020   Procedure: ENDOSCOPIC RETROGRADE CHOLANGIOPANCREATOGRAPHY (ERCP);  Surgeon: Alexander Houston, MD;  Location: [facility];  Service: Endoscopy;  Laterality: N/A;   ERCP N/A 12/27/2020   Procedure: ENDOSCOPIC RETROGRADE CHOLANGIOPANCREATOGRAPHY (ERCP);  Surgeon: Alexander Houston, MD;  Location: [facility];  Service:  Endoscopy;  Laterality: N/A;   ESOPHAGOGASTRODUODENOSCOPY (EGD) WITH PROPOFOL N/A 11/30/2020   Procedure: ESOPHAGOGASTRODUODENOSCOPY (EGD) WITH PROPOFOL;  Surgeon: Alexander Banister, MD;  Location: [facility];  Service: Endoscopy;  Laterality: N/A;   EUS N/A 11/30/2020   Procedure: UPPER ENDOSCOPIC ULTRASOUND (EUS) RADIAL;  Surgeon: Alexander Banister, MD;  Location: [facility];  Service: Endoscopy;  Laterality: N/A;   FINE NEEDLE ASPIRATION N/A 11/30/2020   Procedure: FINE NEEDLE ASPIRATION (FNA) LINEAR;  Surgeon: Alexander Banister, MD;  Location: [facility];  Service: Endoscopy;  Laterality: N/A;   GIVENS CAPSULE STUDY     IR IMAGING GUIDED PORT INSERTION  12/18/2020   KNEE SURGERY     4 surgeries on left, 2 on right knee   SHOULDER SURGERY     right shoulder   SPHINCTEROTOMY N/A 11/09/2020   Procedure: SHORT BILIARY SPHINCTEROTOMY;  Surgeon: Alexander Houston, MD;  Location: [facility];  Service: Endoscopy;  Laterality: N/A;    SOCIAL HISTORY:  Social History   Socioeconomic History   Marital status: Married    Spouse name: Not on file   Number of children: Not on file   Years of education: Not on file   Highest education level: Not on file  Occupational History   Not on file  Tobacco Use   Smoking status: Every Day    Types: Cigars   Smokeless tobacco: Never   Tobacco comments:    Smoke 3-4 cigars a day  Vaping Use   Vaping Use: Never used  Substance and Sexual Activity   Alcohol use: Yes    Comment: occasional   Drug use: Never   Sexual activity: Not on file  Other Topics Concern   Not on file  Social History Narrative   Not on file   Social Determinants of Health   Financial Resource Strain: Low Risk    Difficulty of Paying Living Expenses: Not hard at all  Food Insecurity: No Food Insecurity   Worried About Charity fundraiser in the Last Year: Never true   [place] in the Last Year: Never true  Transportation Needs: No Transportation Needs   Lack of Transportation (Medical): No   Lack of Transportation (Non-Medical): No  Physical Activity: Sufficiently Active   Days of Exercise per Week: 5 days   Minutes of Exercise per Session: 30 min  Stress: Not on file  Social Connections: Not on file  Intimate Partner Violence: Not At Risk   Fear of Current or Ex-Partner: No   Emotionally Abused: No    Physically Abused: No   Sexually Abused: No    FAMILY HISTORY:  Family History  Problem Relation Age of Onset   Heart attack Father    Heart attack Paternal Grandfather    Leukemia Sister    Heart attack Brother     CURRENT MEDICATIONS:  Current Outpatient Medications  Medication Sig Dispense Refill   aspirin 325 MG tablet Take by mouth.     Continuous Blood Gluc Receiver (FREESTYLE LIBRE 2 READER) DEVI As directed 1 each 0   Continuous Blood Gluc Sensor (FREESTYLE LIBRE 2 SENSOR) MISC 1 Piece by Does not apply route every 14 (fourteen) days. 6 each 2   Glucosamine-Chondroitin (OSTEO BI-FLEX REGULAR STRENGTH PO) Take 2 tablets by mouth daily.     insulin detemir (LEVEMIR) 100 UNIT/ML FlexPen Inject 30 Units into the skin at bedtime. 15 mL 2   insulin lispro (HUMALOG KWIKPEN) 100 UNIT/ML KwikPen Inject 8-12 Units  into the skin 3 (three) times daily before meals. 15 mL 2   IRON-VITAMIN C PO Take 1 tablet by mouth daily.     levothyroxine (SYNTHROID) 175 MCG tablet TAKE 1 TABLET DAILY 90 tablet 1   levothyroxine (SYNTHROID) 25 MCG tablet Take one tablet po daily 90 tablet 0   lipase/protease/amylase (CREON) 36000 UNITS CPEP capsule Take 2 capsules (72,000 Units total) by mouth 3 (three) times daily with meals. May also take 1 capsule (36,000 Units total) as needed (with snacks). 240 capsule 11   oxyCODONE-acetaminophen (PERCOCET/ROXICET) 5-325 MG tablet Take 1 tablet by mouth every 6 (six) hours as needed for severe pain. 20 tablet 0   sildenafil (VIAGRA) 100 MG tablet 50 mg as needed.     triamcinolone cream (KENALOG) 0.1 % Apply to affected areas BID prn 30 g 3   lidocaine-prilocaine (EMLA) cream Apply a small amount to port a cath site (do not rub in) and cover with plastic wrap 1 hour prior to infusion appointments (Patient not taking: Reported on 07/05/2021) 30 g 3   No current facility-administered medications for this visit.   Facility-Administered Medications Ordered in Other  Visits  Medication Dose Route Frequency Provider Last Rate Last Admin   alteplase (CATHFLO ACTIVASE) injection 2 mg  2 mg Intracatheter Once PRN Derek Jack, MD       heparin lock flush 100 unit/mL  250 Units Intracatheter Once PRN Derek Jack, MD       sodium chloride flush (NS) 0.9 % injection 10 mL  10 mL Intracatheter PRN Derek Jack, MD   10 mL at 01/26/21 1314   sodium chloride flush (NS) 0.9 % injection 3 mL  3 mL Intracatheter PRN Derek Jack, MD        ALLERGIES:  Allergies  Allergen Reactions   Lisinopril Cough    PHYSICAL EXAM:  Performance status (ECOG): 1 - Symptomatic but completely ambulatory  Vitals:   07/05/21 0942  BP: 108/68  Pulse: (!) 106  Resp: 18  Temp: 99.1 F (37.3 C)  SpO2: 100%   Wt Readings from Last 3 Encounters:  07/05/21 180 lb 5.4 oz (81.8 kg)  06/26/21 185 lb 9.6 oz (84.2 kg)  05/31/21 192 lb 9.6 oz (87.4 kg)   Physical Exam Vitals reviewed.  Constitutional:      Appearance: Normal appearance.  Cardiovascular:     Rate and Rhythm: Normal rate and regular rhythm.     Pulses: Normal pulses.     Heart sounds: Normal heart sounds.  Pulmonary:     Effort: Pulmonary effort is normal.     Breath sounds: Normal breath sounds.  Neurological:     General: No focal deficit present.     Mental Status: He is alert and oriented to person, place, and time.  Psychiatric:        Mood and Affect: Mood normal.        Behavior: Behavior normal.     LABORATORY DATA:  I have reviewed the labs as listed.  CBC Latest Ref Rng & Units 06/26/2021 04/30/2021 03/28/2021  WBC 4.0 - 10.5 K/uL 8.6 3.2(L) 15.1(H)  Hemoglobin 13.0 - 17.0 g/dL 12.2(L) 13.6 11.7(L)  Hematocrit 39.0 - 52.0 % 39.6 41.0 35.1(L)  Platelets 150 - 400 K/uL 448(H) 190 324   CMP Latest Ref Rng & Units 06/26/2021 06/07/2021 05/31/2021  Glucose 70 - 99 mg/dL 120(H) - 168(H)  BUN 8 - 23 mg/dL 13 - 20  Creatinine 0.61 - 1.24 mg/dL 0.80 - 1.01  Sodium 135 -  145 mmol/L 138 - 134(L)  Potassium 3.5 - 5.1 mmol/L 4.0 - 3.3(L)  Chloride 98 - 111 mmol/L 105 - 102  CO2 22 - 32 mmol/L 25 - 24  Calcium 8.9 - 10.3 mg/dL 8.4(L) - 7.7(L)  Total Protein 6.5 - 8.1 g/dL 6.7 5.6(L) 5.8(L)  Total Bilirubin 0.3 - 1.2 mg/dL 0.4 0.4 0.3  Alkaline Phos 38 - 126 U/L 129(H) 192(H) 140(H)  AST 15 - 41 U/L 16 42(H) 221(H)  ALT 0 - 44 U/L 16 94(H) 95(H)    DIAGNOSTIC IMAGING:  I have independently reviewed the scans and discussed with the patient. CT CHEST ABDOMEN PELVIS W CONTRAST  Result Date: 07/04/2021 CLINICAL DATA:  Pancreatic cancer, status post Whipple procedure. Diabetes. Hypertension. EXAM: CT CHEST, ABDOMEN, AND PELVIS WITH CONTRAST TECHNIQUE: Multidetector CT imaging of the chest, abdomen and pelvis was performed following the standard protocol during bolus administration of intravenous contrast. RADIATION DOSE REDUCTION: This exam was performed according to the departmental dose-optimization program which includes automated exposure control, adjustment of the mA and/or kV according to patient size and/or use of iterative reconstruction technique. CONTRAST:  35m OMNIPAQUE IOHEXOL 300 MG/ML  SOLN COMPARISON:  03/26/2021 FINDINGS: CT CHEST FINDINGS Cardiovascular: Right Port-A-Cath tip low SVC. Aortic atherosclerosis. Mild ascending aortic dilatation again identified, on the order of 4.1 cm. Tortuous thoracic aorta. Normal heart size, without pericardial effusion. Three vessel coronary artery calcification. No central pulmonary embolism, on this non-dedicated study. Mediastinum/Nodes: No supraclavicular adenopathy. No mediastinal or hilar adenopathy. Lungs/Pleura: No pleural fluid. Mild centrilobular emphysema. Nodule on the right minor fissure of 3 mm on 90/4 is unchanged. A superior segment left lower lobe 2 mm subpleural pulmonary nodule on 57/4 is likely a calcified granuloma. Musculoskeletal: No acute osseous abnormality. CT ABDOMEN PELVIS FINDINGS Hepatobiliary:  Moderate hepatic steatosis, without focal liver lesion. Pneumobilia. [place]ed[place]o jejunostomy. Pancreas: Status post pancreatic head resection. Atrophy of the remaining pancreas with trace gas within the duct. Soft tissue cephalad to surgical sutures including on 74/2 is favored to represent poorly opacified jejunum. No peripancreatic edema. Spleen: Normal in size, without focal abnormality. Adrenals/Urinary Tract: Normal adrenal glands. Bilateral too small to characterize renal lesions. No hydronephrosis. Normal urinary bladder. Stomach/Bowel: Gastrojejunostomy. Normal colon and terminal ileum. Presumed appendectomy. Otherwise normal small bowel. Vascular/Lymphatic: Aortic atherosclerosis. Celiac is normal. There is interstitial thickening within the portacaval space and adjacent the right-side of the SMA, including on 72/2. No vascular narrowing. Portacaval node measures 8 mm on 66/2 versus 5 mm on the prior. Not pathologic by size criteria. No pelvic sidewall adenopathy. Reproductive: Normal prostate. Other: No free intraperitoneal air. No significant free fluid. No evidence of omental or peritoneal disease. Musculoskeletal: Degenerative partial fusion of bilateral sacroiliac joints. IMPRESSION: 1. Status post Whipple procedure, without specific evidence of residual or recurrent disease. 2. Interstitial thickening within the portacaval space and along the course of the proximal SMA. Possibly treatment related. Recommend attention on follow-up. 3.  No acute process or evidence of metastatic disease in the chest. 4. Aortic atherosclerosis (ICD10-I70.0), coronary artery atherosclerosis and emphysema (ICD10-J43.9). 5. Mild ascending aortic dilatation at 4.1 cm. Recommend annual imaging followup by CTA or MRA. This recommendation follows 2010 ACCF/AHA/AATS/ACR/ASA/SCA/SCAI/SIR/STS/SVM Guidelines for the Diagnosis and Management of Patients with Thoracic Aortic Disease. Circulation. 2010; 121:: [id number] Aortic aneurysm  NOS (ICD10-I71.9) 6. Hepatic steatosis. Electronically Signed   By: KAbigail MiyamotoM.D.   On: 07/04/2021 09:21     ASSESSMENT:  1.  Stage I (T2N0) pancreatic  adenocarcinoma: - Recent admission to the hospital with DKA, found to have elevated liver enzymes. - MRCP without contrast on 11/08/2020 showed 2.3 x 2 cm central pancreatic head and uncinate mass with intra and extrahepatic biliary ductal dilation. - 11/09/2020-ERCP sphincterotomy and stenting with findings showing short high-grade distal CBD stricture with dilated CBD up to 14 mm.  Bile duct brushing was negative for malignant cells. - 11/10/2020-CA 19.9-367. - ERCP/EUS by [name] on 11/30/2020 with 2.9 cm mass in the head of the pancreas with unclear outer borders.  Given significant dilatation, indistinct borders, unable to confidently assess major vascular involvement.  Abutment and probable invasion of SMV and portal vein near the confluence with splenic vein.  No obvious SMA or celiac artery involvement.  No other adenopathy. - FNA of the pancreatic mass consistent with adenocarcinoma. - Weight loss intentional 25 pounds between January and April 1.  Additional 25 pound weight loss since 1 April, unintentional. - CT [facility] pancreatic protocol on 12/05/2020 with ill-defined mass in the pancreatic head and uncinate process measuring up to 1.9 x 2.1 cm with associated pancreatic ductal dilatation up to 8 mm.  No enlarged adenopathy.  Teardrop morphology of SMV indicative of SMV involvement.  Replaced right hepatic arterial supply arises from the SMA passing immediately posterior to the tumor. - PET scan on 12/14/2020 with mass in the pancreatic head with hypermetabolic features, SUV 5.3 with no sign of metastatic disease. - 5 cycles of neoadjuvant FOLFIRINOX from 01/10/2021 through 03/14/2021. - Whipple's procedure with IRE by Dr.Ianniti on 05/15/2021. - Pathology consistent with grade 2 moderate adenocarcinoma, 3.7 cm, absent treatment effect, no  evidence of tumor regression, margins positive, 6/36 nodes positive, YPT2YPN2.  2.  Social/family history: - He lives at home with his wife and is independent of all ADLs and IADLs. - He currently works in [employer].  No history of chemical exposure. - He previously smoked cigarettes.  Currently smokes cigars 4/day. - Maternal grandmother died at age 36 with malignancy, unknown type. - Sister died of acute leukemia.   PLAN:  1.  YpT2pN2 pancreatic adenocarcinoma: -We have discussed CT CAP from 07/04/2021 which showed status post Whipple's procedure with no evidence of residual or recurrence disease.  Interstitial thickening within the portacaval space and along the course of the proximal SMA.  No metastatic disease in the chest. - His CEA 19-9 was 64. - His energy levels are improving. - We talked about adjuvant chemotherapy for additional 4 months to complete 6 months of perioperative therapy.  As he had no treatment response with FOLFIRINOX regimen, 5 cycles preoperatively, I have recommended alternative regimen.  We talked about gemcitabine plus capecitabine with gemcitabine given on day 1, 8, 15 every 28 days.  He will take capecitabine twice daily 21 days on/1 week off. - Upon completion of chemotherapy, he will receive chemoradiation therapy with Xeloda for positive margins (R1 resection). - We have discussed side effects in detail. - As he is losing weight, I have recommended initiation of chemotherapy in the next 2 weeks which will be 9 weeks postoperatively. - We will also contact Dr. Rico Sheehan team to review CT scans.  2.  Weight loss: -He has lost 5 pounds since last visit.  He is taking Marinol twice daily. - I have recommended him to start taking Megace 400 mg twice daily.  3.  Hypomagnesemia: -Continue magnesium 1 tablet daily.  4.  Pancreatic insufficiency: - Continue Creon 2 capsules with meals and 1 capsule with snacks.  Orders placed this encounter:  No orders of the  defined types were placed in this encounter.    Derek Jack, MD [place] [phone]   I, Thana Ates, am acting as a scribe for Dr. Derek Jack.  I, Derek Jack MD, have reviewed the above documentation for accuracy and completeness, and I agree with the above.

## 2021-07-05 NOTE — Progress Notes (Signed)
DISCONTINUE ON PATHWAY REGIMEN - Pancreatic Adenocarcinoma  No Medical Intervention - Off Treatment.  REASON: Other Reason PRIOR TREATMENT: [id number]: Referral to Surgery  START ON PATHWAY REGIMEN - Pancreatic Adenocarcinoma     A cycle is every 28 days:     Capecitabine      Gemcitabine   **Always confirm dose/schedule in your pharmacy ordering system**  Patient Characteristics: Post-Neoadjuvant Therapy and Resection (Neoadjuvant Pathologic Staging), Positive Margins Therapeutic Status: Post-Neoadjuvant Therapy and Resection (Neoadjuvant Pathologic Staging) AJCC T Category: ypT2 AJCC N Category: ypN2 AJCC M Category: cM0 AJCC 8 Stage Grouping: III Intent of Therapy: Curative Intent, Discussed with Patient

## 2021-07-06 ENCOUNTER — Telehealth (HOSPITAL_COMMUNITY): Payer: Self-pay | Admitting: Pharmacist

## 2021-07-06 ENCOUNTER — Other Ambulatory Visit (HOSPITAL_COMMUNITY): Payer: Self-pay

## 2021-07-06 ENCOUNTER — Encounter (HOSPITAL_COMMUNITY): Payer: Self-pay

## 2021-07-06 ENCOUNTER — Encounter (HOSPITAL_COMMUNITY): Payer: Self-pay | Admitting: Hematology

## 2021-07-06 DIAGNOSIS — C259 Malignant neoplasm of pancreas, unspecified: Secondary | ICD-10-CM

## 2021-07-06 MED ORDER — CAPECITABINE 500 MG PO TABS
1500.0000 mg | ORAL_TABLET | Freq: Two times a day (BID) | ORAL | 0 refills | Status: DC
  Filled 2021-07-06: qty 126, 21d supply, fill #0

## 2021-07-06 MED ORDER — CAPECITABINE 500 MG PO TABS: 1500.0000 mg | ORAL_TABLET | Freq: Two times a day (BID) | ORAL | 0 refills | Status: DC

## 2021-07-06 NOTE — Telephone Encounter (Signed)
Oral Oncology Pharmacist Encounter  Received new prescription for capecitabine (Xeloda) for the treatment of stage I pancreatic adenocarcinoma in conjunction with gemcitabine, planned duration 6 cycles.  Labs from 06/26/21 assessed, no relevant lab abnormalities. Prescription dose and frequency assessed.   Current medication list in Epic reviewed, no DDIs with capecitabine identified.  Evaluated chart and no patient barriers to medication adherence identified.   Prescription has been e-scribed to the [facility] for benefits analysis and approval.  Oral Oncology Clinic will continue to follow for insurance authorization, copayment issues, initial counseling and start date.  Patient agreed to treatment on 07/05/21 per MD documentation.  Benn Moulder, PharmD Pharmacy Resident  07/06/2021 10:34 AM

## 2021-07-06 NOTE — Telephone Encounter (Signed)
Oral Oncology Pharmacist Encounter   Received notification from [employer] that prior authorization for capecitabine is required.   PA submitted on [employer] (online portal) Prior Auth (EOC) ID:  [id number] Status is pending   Oral Oncology Clinic will continue to follow.   Darl Pikes, PharmD, BCPS, [facility] Hematology/Oncology Clinical Pharmacist [facility] [place] [facility] [phone]  07/06/2021 2:46 PM

## 2021-07-06 NOTE — Telephone Encounter (Signed)
Oral Chemotherapy Pharmacist Encounter   PA approved, patient required to fill at [facility], prescription redirected. Patient notified   Darl Pikes, PharmD, Bebe Shaggy, CPP Hematology/Oncology Clinical Pharmacist [place] [place] [facility] [phone]  07/06/2021 4:52 PM

## 2021-07-12 ENCOUNTER — Other Ambulatory Visit (HOSPITAL_COMMUNITY): Payer: Self-pay

## 2021-07-12 ENCOUNTER — Encounter: Payer: Self-pay | Admitting: "Endocrinology

## 2021-07-12 ENCOUNTER — Ambulatory Visit (INDEPENDENT_AMBULATORY_CARE_PROVIDER_SITE_OTHER): Payer: BC Managed Care – PPO | Admitting: "Endocrinology

## 2021-07-12 VITALS — BP 100/74 | HR 96 | Ht 75.0 in | Wt 181.0 lb

## 2021-07-12 DIAGNOSIS — K8689 Other specified diseases of pancreas: Secondary | ICD-10-CM | POA: Diagnosis not present

## 2021-07-12 DIAGNOSIS — E089 Diabetes mellitus due to underlying condition without complications: Secondary | ICD-10-CM

## 2021-07-12 DIAGNOSIS — E782 Mixed hyperlipidemia: Secondary | ICD-10-CM | POA: Diagnosis not present

## 2021-07-12 DIAGNOSIS — E039 Hypothyroidism, unspecified: Secondary | ICD-10-CM | POA: Diagnosis not present

## 2021-07-12 MED ORDER — CAPECITABINE 500 MG PO TABS: 1500.0000 mg | ORAL_TABLET | Freq: Two times a day (BID) | ORAL | 0 refills | Status: DC

## 2021-07-12 MED ORDER — CAPECITABINE 500 MG PO TABS
1500.0000 mg | ORAL_TABLET | Freq: Two times a day (BID) | ORAL | 0 refills | Status: DC
  Filled 2021-07-12 – 2021-07-13 (×2): qty 126, 21d supply, fill #0

## 2021-07-12 MED ORDER — INSULIN LISPRO (1 UNIT DIAL) 100 UNIT/ML (KWIKPEN): 4.0000 [IU] | PEN_INJECTOR | Freq: Three times a day (TID) | SUBCUTANEOUS | 1 refills | Status: DC

## 2021-07-12 MED ORDER — INSULIN DETEMIR 100 UNIT/ML FLEXPEN: 8.0000 [IU] | PEN_INJECTOR | Freq: Every day | SUBCUTANEOUS | 1 refills | Status: DC

## 2021-07-12 NOTE — Progress Notes (Signed)
07/12/2021, 10:25 AM  Endocrinology follow-up note   Subjective:    Patient ID: Alexander Banks, male    DOB: 10-08-1955.  Alexander Banks is being seen in follow-up after he was seen in consultation for management of currently uncontrolled symptomatic diabetes requested by  [name], MD. Patient is being considered for possible Whipple's procedure for locally advanced pancreatic cancer.   Past Medical History:  Diagnosis Date   Anemia, iron deficiency    Diabetes mellitus without complication ([place])    on meds   Hearing loss    Bil hearing aids   Hyperlipidemia    Hypertension    Port-A-Cath in place 12/26/2020   Prediabetes    Sleep apnea     Past Surgical History:  Procedure Laterality Date   BILIARY STENT PLACEMENT N/A 11/09/2020   Procedure: BILIARY STENT PLACEMENT - 56 F X 5 CM STENT;  Surgeon: Rogene Houston, MD;  Location: [facility];  Service: Endoscopy;  Laterality: N/A;   BILIARY STENT PLACEMENT N/A 12/27/2020   Procedure: BILIARY STENT PLACEMENT;  Surgeon: Rogene Houston, MD;  Location: [facility];  Service: Endoscopy;  Laterality: N/A;   COLONOSCOPY     ERCP N/A 11/09/2020   Procedure: ENDOSCOPIC RETROGRADE CHOLANGIOPANCREATOGRAPHY (ERCP);  Surgeon: Rogene Houston, MD;  Location: [facility];  Service: Endoscopy;  Laterality: N/A;   ERCP N/A 12/27/2020   Procedure: ENDOSCOPIC RETROGRADE CHOLANGIOPANCREATOGRAPHY (ERCP);  Surgeon: Rogene Houston, MD;  Location: [facility];  Service: Endoscopy;  Laterality: N/A;   ESOPHAGOGASTRODUODENOSCOPY (EGD) WITH PROPOFOL N/A 11/30/2020   Procedure: ESOPHAGOGASTRODUODENOSCOPY (EGD) WITH PROPOFOL;  Surgeon: Milus Banister, MD;  Location: [facility];  Service: Endoscopy;  Laterality: N/A;   EUS N/A 11/30/2020   Procedure: UPPER ENDOSCOPIC ULTRASOUND (EUS) RADIAL;  Surgeon: Milus Banister, MD;  Location: [facility];  Service: Endoscopy;   Laterality: N/A;   FINE NEEDLE ASPIRATION N/A 11/30/2020   Procedure: FINE NEEDLE ASPIRATION (FNA) LINEAR;  Surgeon: Milus Banister, MD;  Location: [facility];  Service: Endoscopy;  Laterality: N/A;   GIVENS CAPSULE STUDY     IR IMAGING GUIDED PORT INSERTION  12/18/2020   KNEE SURGERY     4 surgeries on left, 2 on right knee   SHOULDER SURGERY     right shoulder   SPHINCTEROTOMY N/A 11/09/2020   Procedure: SHORT BILIARY SPHINCTEROTOMY;  Surgeon: Rogene Houston, MD;  Location: [facility];  Service: Endoscopy;  Laterality: N/A;   WHIPPLE PROCEDURE      Social History   Socioeconomic History   Marital status: Married    Spouse name: Not on file   Number of children: Not on file   Years of education: Not on file   Highest education level: Not on file  Occupational History   Not on file  Tobacco Use   Smoking status: Every Day    Types: Cigars   Smokeless tobacco: Never   Tobacco comments:    Smoke 3-4 cigars a day  Vaping Use   Vaping Use: Never used  Substance and Sexual Activity   Alcohol use: Yes  Comment: occasional   Drug use: Never   Sexual activity: Not on file  Other Topics Concern   Not on file  Social History Narrative   Not on file   Social Determinants of Health   Financial Resource Strain: Low Risk    Difficulty of Paying Living Expenses: Not hard at all  Food Insecurity: No Food Insecurity   Worried About Running Out of Food in the Last Year: Never true   [place] in the Last Year: Never true  Transportation Needs: No Transportation Needs   Lack of Transportation (Medical): No   Lack of Transportation (Non-Medical): No  Physical Activity: Sufficiently Active   Days of Exercise per Week: 5 days   Minutes of Exercise per Session: 30 min  Stress: Not on file  Social Connections: Not on file    Family History  Problem Relation Age of Onset   Heart attack Father    Heart attack Paternal Grandfather    Leukemia Sister    Heart attack  Brother     Outpatient Encounter Medications as of 07/12/2021  Medication Sig   capecitabine (XELODA) 500 MG tablet Take 3 tablets (1,500 mg total) by mouth 2 (two) times daily after a meal. Take for 21 days on, followed by 7 days off. Repeat every 28 days.   Continuous Blood Gluc Receiver (FREESTYLE LIBRE 2 READER) DEVI As directed   Continuous Blood Gluc Sensor (FREESTYLE LIBRE 2 SENSOR) MISC 1 Piece by Does not apply route every 14 (fourteen) days.   Glucosamine-Chondroitin (OSTEO BI-FLEX REGULAR STRENGTH PO) Take 2 tablets by mouth daily.   insulin detemir (LEVEMIR) 100 UNIT/ML FlexPen Inject 8 Units into the skin at bedtime.   insulin lispro (HUMALOG KWIKPEN) 100 UNIT/ML KwikPen Inject 4-7 Units into the skin 3 (three) times daily before meals.   IRON-VITAMIN C PO Take 1 tablet by mouth daily.   levothyroxine (SYNTHROID) 175 MCG tablet TAKE 1 TABLET DAILY   levothyroxine (SYNTHROID) 25 MCG tablet Take one tablet po daily   lipase/protease/amylase (CREON) 36000 UNITS CPEP capsule Take 2 capsules (72,000 Units total) by mouth 3 (three) times daily with meals. May also take 1 capsule (36,000 Units total) as needed (with snacks).   oxyCODONE-acetaminophen (PERCOCET/ROXICET) 5-325 MG tablet Take 1 tablet by mouth every 6 (six) hours as needed for severe pain.   sildenafil (VIAGRA) 100 MG tablet 50 mg as needed.   triamcinolone cream (KENALOG) 0.1 % Apply to affected areas BID prn   [DISCONTINUED] aspirin 325 MG tablet Take by mouth.   [DISCONTINUED] insulin detemir (LEVEMIR) 100 UNIT/ML FlexPen Inject 30 Units into the skin at bedtime. (Patient taking differently: Inject 8 Units into the skin at bedtime.)   [DISCONTINUED] insulin lispro (HUMALOG KWIKPEN) 100 UNIT/ML KwikPen Inject 8-12 Units into the skin 3 (three) times daily before meals.   No facility-administered encounter medications on file as of 07/12/2021.    ALLERGIES: Allergies  Allergen Reactions   Lisinopril Cough     VACCINATION STATUS: Immunization History  Administered Date(s) Administered   Influenza,inj,Quad PF,6+ Mos 04/15/2018, 04/09/2019, 02/28/2021   Pneumococcal Polysaccharide-23 08/10/2013   Td 06/18/2002, 11/16/2009     Hyperlipidemia This is a chronic problem. The current episode started more than 1 year ago. He has tried statins for the symptoms.  Diabetes He presents for his follow-up diabetic visit. He has type 2 diabetes mellitus. His disease course has been improving. There are no hypoglycemic associated symptoms. Pertinent negatives for hypoglycemia include no confusion, pallor  or seizures. Associated symptoms include polydipsia and polyuria. Pertinent negatives for diabetes include no polyphagia. There are no hypoglycemic complications. Risk factors for coronary artery disease include diabetes mellitus, dyslipidemia and male sex. Current diabetic treatment includes insulin injections. His weight is fluctuating minimally. He is following a generally unhealthy diet. He has had a previous visit with a dietitian. His home blood glucose trend is decreasing steadily. His breakfast blood glucose range is generally 110-130 mg/dl. His lunch blood glucose range is generally 110-130 mg/dl. His dinner blood glucose range is generally 110-130 mg/dl. His bedtime blood glucose range is generally 110-130 mg/dl. His overall blood glucose range is 110-130 mg/dl. (He presents with his CGM.  AGP shows 94% time range, 3% above range.  No significant hypoglycemia.  He is current dose of insulin is much lower than previously. He remains on Creon.  He lost about 15 pounds since last visit.  He is being considered for possible Whipple surgery for locally advancing pancreatic cancer.)    Review of Systems  Constitutional:  Negative for unexpected weight change.  HENT:  Negative for dental problem, mouth sores and trouble swallowing.   Eyes:  Negative for visual disturbance.  Respiratory:  Negative for choking,  chest tightness, shortness of breath and wheezing.   Cardiovascular:  Negative for palpitations and leg swelling.  Gastrointestinal:  Negative for abdominal distention, constipation and diarrhea.  Endocrine: Positive for polydipsia and polyuria. Negative for polyphagia.  Genitourinary:  Negative for dysuria, flank pain, hematuria and urgency.  Musculoskeletal:  Negative for back pain and gait problem.  Skin:  Negative for pallor and wound.  Neurological:  Negative for seizures and syncope.  Psychiatric/Behavioral:  Negative for confusion and dysphoric mood.    Objective:    Vitals with BMI 07/12/2021 07/05/2021 06/26/2021  Height 6\' 3"  6\' 3"  -  Weight 181 lbs 180 lbs 5 oz 185 lbs 10 oz  BMI 62.83 66.29 -  Systolic 476 546 503  Diastolic 74 68 76  Pulse 96 106 109    BP 100/74    Pulse 96    Ht 6\' 3"  (1.905 m)    Wt 181 lb (82.1 kg)    BMI 22.62 kg/m   Wt Readings from Last 3 Encounters:  07/12/21 181 lb (82.1 kg)  07/05/21 180 lb 5.4 oz (81.8 kg)  06/26/21 185 lb 9.6 oz (84.2 kg)       CMP ( most recent) CMP     Component Value Date/Time   NA 138 06/26/2021 0909   NA 140 04/30/2021 1655   K 4.0 06/26/2021 0909   CL 105 06/26/2021 0909   CO2 25 06/26/2021 0909   GLUCOSE 120 (H) 06/26/2021 0909   BUN 13 06/26/2021 0909   BUN 12 04/30/2021 1655   CREATININE 0.80 06/26/2021 0909   CREATININE 1.10 05/31/2013 1020   CALCIUM 8.4 (L) 06/26/2021 0909   PROT 6.7 06/26/2021 0909   PROT 5.6 (L) 06/07/2021 0826   ALBUMIN 2.7 (L) 06/26/2021 0909   ALBUMIN 2.5 (L) 06/07/2021 0826   AST 16 06/26/2021 0909   ALT 16 06/26/2021 0909   ALKPHOS 129 (H) 06/26/2021 0909   BILITOT 0.4 06/26/2021 0909   BILITOT 0.4 06/07/2021 0826   GFRNONAA >60 06/26/2021 0909   GFRAA 87 06/02/2020 0812     Diabetic Labs (most recent): Lab Results  Component Value Date   HGBA1C 6.8 04/09/2021   HGBA1C 7.9 (A) 01/02/2021   HGBA1C 12.8 (H) 11/04/2020     Lipid Panel (  most recent) Lipid Panel      Component Value Date/Time   CHOL 158 05/08/2021 0805   TRIG 106 05/08/2021 0805   HDL 50 05/08/2021 0805   CHOLHDL 3.2 05/08/2021 0805   CHOLHDL 3.5 05/31/2013 1020   VLDL 23 05/31/2013 1020   LDLCALC 89 05/08/2021 0805   LABVLDL 19 05/08/2021 0805      Lab Results  Component Value Date   TSH 39.931 (H) 05/31/2021   TSH 3.350 12/22/2020   TSH 0.529 11/03/2020   TSH 0.438 (L) 06/02/2020   TSH 1.070 11/24/2019   TSH 5.940 (H) 04/05/2019   TSH 0.225 (L) 10/07/2018   TSH 1.210 04/08/2018   TSH 0.344 (L) 07/17/2017   TSH 0.608 12/09/2016   FREET4 0.88 05/31/2021   FREET4 1.22 12/22/2020   FREET4 1.65 12/09/2016   FREET4 0.92 11/28/2014      Assessment & Plan:   1. Type 2 diabetes mellitus with stage 3a chronic kidney disease, with long-term current use of insulin ([place])   - Alexander Banks has currently uncontrolled symptomatic type 2 DM since  66 years of age.  He presents with his CGM.  AGP shows 94% time range, 3% above range.  No significant hypoglycemia.  He is current dose of insulin is much lower than previously. He remains on Creon.  He lost about 15 pounds since last visit.  He is being considered for possible Whipple surgery for locally advancing pancreatic cancer. His previsit labs show A1c of 7.9%, overall improving from 4.8%.    -His interval developments were reviewed.  He was diagnosed with pancreatic cancer, currently being prepared for rounds of chemotherapy to be followed by surgery.   - I had a long discussion with him about the progressive nature of diabetes and the pathology behind its complications. He will need further study to classify his diabetes properly.  He will have an Tylosin antibodies and antiglutamic acid decarboxylase antibodies measurements. -his diabetes is complicated by recent severe hyperglycemia, DKA and he remains at a high risk for more acute and chronic complications which include CAD, CVA, CKD, retinopathy, and neuropathy.  These are all discussed in detail with him.  - I have counseled him on diet  and weight management  by adopting a carbohydrate restricted/protein rich diet. Patient is encouraged to switch to  unprocessed or minimally processed     complex starch and increased protein intake (animal or plant source), fruits, and vegetables. -  he is advised to stick to a routine mealtimes to eat 3 meals  a day and avoid unnecessary snacks ( to snack only to correct hypoglycemia).   - he acknowledges that there is a room for improvement in his food and drink choices. - Suggestion is made for him to avoid simple carbohydrates  from his diet including Cakes, Sweet Desserts, Ice Cream, Soda (diet and regular), Sweet Tea, Candies, Chips, Cookies, Store Bought Juices, Alcohol in Excess of  1-2 drinks a day, Artificial Sweeteners,  Coffee Creamer, and "Sugar-free" Products, Lemonade. This will help patient to have more stable blood glucose profile and potentially avoid unintended weight gain.   - he will be scheduled with Alexander Banks, RDN, CDE for diabetes education.  He has seen her before, lost contact for the last 3 years.  - I have approached him with the following individualized plan to manage  his diabetes and patient agrees:   -In light of his ongoing diagnosis and treatment for pancreatic cancer, he will be kept away  from oral medications or GLP-1 receptor agonist.  He is being considered for surgery for pancreatic cancer.    He will continue on intensive basal/bolus insulin in order for him to achieve and maintain control of diabetes to target.    -His is advised to continue on the lowered dose of Levemir at 8 units nightly, Humalog lowered to 4 units 3  times a day with meals  for pre-meal BG readings of 90-150mg /dl, plus patient specific correction dose for unexpected hyperglycemia above 150mg /dl, associated with strict monitoring of glucose 4 times a day-before meals and at bedtime. - he is warned not to  take insulin without proper monitoring per orders. - Adjustment parameters are given to him for hypo and hyperglycemia in writing. - he is encouraged to call clinic for blood glucose levels less than 70 or above 300 mg /dl. -He is benefiting from his CGM, advised to continue to wear it at all times.   - Specific targets for  A1c;  LDL, HDL,  and Triglycerides were discussed with the patient.  2) Blood Pressure /Hypertension:   His blood pressure is controlled to target.  He is currently not on any antihypertensive medications.  3) Lipids/Hyperlipidemia:   Review of his recent lipid panel showed uncontrolled triglyceride at 182, controlled LDL at 51.  He is currently on Crestor 40 mg p.o. nightly.    4)  Weight/Diet:  Body mass index is 22.62 kg/m.  -This patient weighed as high as 285 in the past.  Over the years he lost weight, initially intentionally, later unintentionally.  He has history of moderate alcohol consumption for decades.  He is not suspect for malabsorption at this time.  He is not a candidate for weight loss.  He is advised to continue Creon 72 units 3 times a day with meals.   5) hypothyroidism: Circumstance of diagnosis is not available.  He denies any surgery or thyroid ablation.  He has taken thyroid hormone for the last 2 years.  His previsit labs confirm Hashimoto's thyroiditis as a cause of hypothyroidism.  His recent thyroid function test show high TSH mainly due to malabsorption of thyroid hormone.  His PMD added 25 mcg extra levothyroxine to make 200 mcg p.o. daily.  He will need thyroid function test measurement in 3 months.     6) Chronic Care/Health Maintenance:  -he  is on Statin medications and  is encouraged to initiate and continue to follow up with Ophthalmology, Dentist,  Podiatrist at least yearly or according to recommendations, and advised to  quit smoking. I have recommended yearly flu vaccine and pneumonia vaccine at least every 5 years; moderate  intensity exercise for up to 150 minutes weekly; and  sleep for at least 7 hours a day.  He is encouraged to continue follow-up with his oncology team regarding a recent diagnosis of pancreatic cancer.    The patient was counseled on the dangers of tobacco use, and was advised to quit.  Reviewed strategies to maximize success, including removing cigarettes and smoking materials from environment.   - he is  advised to maintain close follow up with [name], MD for primary care needs, as well as his other providers for optimal and coordinated care.     I spent 31 minutes in the care of the patient today including review of labs from [place], Lipids, Thyroid Function, Hematology (current and previous including abstractions from other facilities); face-to-face time discussing  his blood glucose readings/logs, discussing hypoglycemia and hyperglycemia  episodes and symptoms, medications doses, his options of short and long term treatment based on the latest standards of care / guidelines;  discussion about incorporating lifestyle medicine;  and documenting the encounter.    Please refer to Patient Instructions for Blood Glucose Monitoring and Insulin/Medications Dosing Guide"  in media tab for additional information. Please  also refer to " Patient Self Inventory" in the Media  tab for reviewed elements of pertinent patient history.  Alexander Banks participated in the discussions, expressed understanding, and voiced agreement with the above plans.  All questions were answered to his satisfaction. he is encouraged to contact clinic should he have any questions or concerns prior to his return visit.    Follow up plan: - Return in about 3 months (around 10/09/2021) for Bring Meter and Logs- A1c in Office.  Alexander Lloyd, MD [facility] [address] [place],  [address] Phone: [phone]  Fax: [phone]    07/12/2021, 10:25  AM  This note was partially dictated with voice recognition software. Similar sounding words can be transcribed inadequately or may not  be corrected upon review.

## 2021-07-13 ENCOUNTER — Other Ambulatory Visit (HOSPITAL_COMMUNITY): Payer: Self-pay

## 2021-07-16 NOTE — Progress Notes (Signed)
Pharmacist Chemotherapy Monitoring - Initial Assessment   ? ?Anticipated start date: 07/23/21  ? ?The following has been reviewed per standard work regarding the patient's treatment regimen: ?The patient's diagnosis, treatment plan and drug doses, and organ/hematologic function ?Lab orders and baseline tests specific to treatment regimen  ?The treatment plan start date, drug sequencing, and pre-medications ?Prior authorization status  ?Patient's documented medication list, including drug-drug interaction screen and prescriptions for anti-emetics and supportive care specific to the treatment regimen ?The drug concentrations, fluid compatibility, administration routes, and timing of the medications to be used ?The patient's access for treatment and lifetime cumulative dose history, if applicable  ?The patient's medication allergies and previous infusion related reactions, if applicable  ? ?Changes made to treatment plan:  ?N/A ? ?Follow up needed:  ?N/A ? ? ?Alexander Banks, [place], ?07/16/2021  12:52 PM  ?

## 2021-07-17 ENCOUNTER — Other Ambulatory Visit (HOSPITAL_COMMUNITY): Payer: Self-pay

## 2021-07-17 NOTE — Telephone Encounter (Signed)
Oral Chemotherapy Pharmacist Encounter  Patient received capecitabine from [facility] on 2/27 and will start therapy on 3/6.   Patient Education I spoke with patient for overview of new oral chemotherapy medication: capecitabine (Xeloda) for the treatment of stage I pancreatic adenocarcinoma in conjunction with gemcitabine, planned duration 6 cycles.  Pt is doing well. Counseled patient on administration, dosing, side effects, monitoring, drug-food interactions, safe handling, storage, and disposal. Patient will take 3 tablets (1500 mg) twice daily for 21 days, followed by 7 days off. Repeat every 28 days.  Side effects include but not limited to: diarrhea, hand and foot syndrome, nausea, vomiting, abdominal pain, fatigue, weakness, mouth sores, and skin darkening.    Reviewed with patient importance of keeping a medication schedule and plan for any missed doses.  After discussion with patient no patient barriers to medication adherence identified.   Mr. Starnes voiced understanding and appreciation. All questions answered. Medication handout provided.  Provided patient with [facility] phone number. Patient knows to call the office with questions or concerns. Oral Chemotherapy Navigation Clinic will continue to follow.  Benn Moulder, PharmD Pharmacy Resident  07/17/2021 12:06 PM

## 2021-07-19 ENCOUNTER — Ambulatory Visit (INDEPENDENT_AMBULATORY_CARE_PROVIDER_SITE_OTHER): Payer: BC Managed Care – PPO | Admitting: Family Medicine

## 2021-07-19 ENCOUNTER — Other Ambulatory Visit: Payer: Self-pay

## 2021-07-19 VITALS — HR 93 | Temp 97.3°F | Ht 75.0 in | Wt 184.0 lb

## 2021-07-19 DIAGNOSIS — E039 Hypothyroidism, unspecified: Secondary | ICD-10-CM

## 2021-07-19 DIAGNOSIS — E7849 Other hyperlipidemia: Secondary | ICD-10-CM

## 2021-07-19 DIAGNOSIS — I700 Atherosclerosis of aorta: Secondary | ICD-10-CM | POA: Diagnosis not present

## 2021-07-19 DIAGNOSIS — I2510 Atherosclerotic heart disease of native coronary artery without angina pectoris: Secondary | ICD-10-CM

## 2021-07-19 DIAGNOSIS — I77819 Aortic ectasia, unspecified site: Secondary | ICD-10-CM

## 2021-07-19 DIAGNOSIS — E785 Hyperlipidemia, unspecified: Secondary | ICD-10-CM

## 2021-07-19 DIAGNOSIS — M2550 Pain in unspecified joint: Secondary | ICD-10-CM

## 2021-07-19 DIAGNOSIS — I2584 Coronary atherosclerosis due to calcified coronary lesion: Secondary | ICD-10-CM | POA: Diagnosis not present

## 2021-07-19 DIAGNOSIS — G6289 Other specified polyneuropathies: Secondary | ICD-10-CM

## 2021-07-19 DIAGNOSIS — E1169 Type 2 diabetes mellitus with other specified complication: Secondary | ICD-10-CM

## 2021-07-19 NOTE — Progress Notes (Signed)
? ?Subjective:  ? ? Patient ID: Alexander Banks, male    DOB: Mar 29, 1956, 66 y.o.   MRN: [id number] ? ?HPI ?Patient is here for follow up for chronic medical conditions ?C/o bilateral foot numbness, bottom half of each foot, denies pain  ?Acquired hypothyroidism - Plan: EKG 12-Lead, Vitamin B12, TSH, C-reactive protein, Sed Rate (ESR), Hepatic function panel ? ?Other polyneuropathy - Plan: Vitamin B12, TSH, C-reactive protein, Sed Rate (ESR), Hepatic function panel ? ?Arthralgia, unspecified joint - Plan: Vitamin B12, TSH, C-reactive protein, Sed Rate (ESR), Hepatic function panel ? ?Atherosclerosis of aorta ([place]) - Plan: EKG 12-Lead, Vitamin B12, TSH, C-reactive protein, Sed Rate (ESR), Hepatic function panel ? ?Other hyperlipidemia - Plan: Lipid panel ? ?Hyperlipidemia associated with type 2 diabetes mellitus ([place]), Chronic ? ?Acquired dilation of ascending aorta and aortic root (HCC) ?Should be noted that he is being treated for pancreatic cancer.  Getting treatments his numbers are coming down but the margins on the surgery did show cells go to the periphery so hopefully chemotherapy will prevent any type of reoccurrence they are following him with serial scans and blood work as well as chemotherapy ? ?He does have hypothyroidism we recently adjusted his medicine he will need a follow-up TSH ? ?He does have some intermittent numbness and tingling in his feet and at times feels numb in the feet other times not as bad but it does bother him but no severe pain ? ?Had a recent CT scan which showed a dilated aorta along with aortic atherosclerosis plus also triple-vessel coronary calcifications patient denies any type of chest tightness pressure pain or shortness of breath ? ?He also has had some intermittent arthralgias and joint discomforts and has needed some medications for this ? ? ?This is the impression of recent CT scan ?IMPRESSION: ?1. Status post Whipple procedure, without specific evidence of ?residual or  recurrent disease. ?2. Interstitial thickening within the portacaval space and along the ?course of the proximal SMA. Possibly treatment related. Recommend ?attention on follow-up. ?3.  No acute process or evidence of metastatic disease in the chest. ?4. Aortic atherosclerosis (ICD10-I70.0), coronary artery ?atherosclerosis and emphysema (ICD10-J43.9). ?5. Mild ascending aortic dilatation at 4.1 cm. Recommend annual ?imaging followup by CTA or MRA. This recommendation follows 2010 ?ACCF/AHA/AATS/ACR/ASA/SCA/SCAI/SIR/STS/SVM Guidelines for the ?Diagnosis and Management of Patients with Thoracic Aortic Disease. ?Circulation. 2010; 121: G549-I264. Aortic aneurysm NOS (ICD10-I71.9) ?6. Hepatic steatosis. ?  ?  ?Electronically Signed ?  By: Abigail Miyamoto M.D. ?  On: 07/04/2021 09:21 ?  ?Review of Systems ? ?   ?Objective:  ? Physical Exam ? ?General-in no acute distress ?Eyes-no discharge ?Lungs-respiratory rate normal, CTA ?CV-no murmurs,RRR ?Extremities skin warm dry no edema ?Neuro grossly normal ?Behavior normal, alert ?Abd soft ?Some edema in legs ? ?  ?   ?Assessment & Plan:  ?1. Acquired hypothyroidism ?Thyroid check TSH continue current medication may need adjustment ?- EKG 12-Lead ?- Vitamin B12 ?- TSH ?- C-reactive protein ?- Sed Rate (ESR) ?- Hepatic function panel ? ?2. Other polyneuropathy ?We will look at B12 could be affected by his chemotherapy also has possible neuropathy related to the diabetes and or chemotherapy nerve conduction studies not indicated currently ?- Vitamin B12 ?- TSH ?- C-reactive protein ?- Sed Rate (ESR) ?- Hepatic function panel ? ?3. Arthralgia, unspecified joint ?Joint pains and discomfort check lab work ?- Vitamin B12 ?- TSH ?- C-reactive protein ?- Sed Rate (ESR) ?- Hepatic function panel ? ?4. Atherosclerosis of aorta ([place]) ?May  well need to reinitiate statin check liver profile.  Check lipid profile. ?- EKG 12-Lead ?- Vitamin B12 ?- TSH ?- C-reactive protein ?- Sed Rate (ESR) ?-  Hepatic function panel ? ?5. Other hyperlipidemia ?Check lipid profile.  Healthy diet recommended. ?- Lipid panel ? ?6. Hyperlipidemia associated with type 2 diabetes mellitus ([place]) ?Lipid profile healthy diet recommended ?A1c under good control ? ?Ascending aorta dilation at 4.1 cm we will monitor on a yearly basis will also touch base with vascular surgeon to see when they typically like to initiate ongoing care ? ?Encouraged him to quit smoking.  He smokes 3 [employer] cigars daily ? ?EKG due to coronary artery calcification does not show any acute changes.  Patient denies chest tightness pressure pain ? ?Nutritional status low albumin encourage protein intake dietary intake and follow-up in 3 months ? ?

## 2021-07-20 LAB — LIPID PANEL
Chol/HDL Ratio: 2.6 ratio (ref 0.0–5.0)
Cholesterol, Total: 78 mg/dL — ABNORMAL LOW (ref 100–199)
HDL: 30 mg/dL — ABNORMAL LOW (ref >39)
LDL Chol Calc (NIH): 33 mg/dL (ref 0–99)
Triglycerides: 67 mg/dL (ref 0–149)
VLDL Cholesterol Cal: 15 mg/dL (ref 5–40)

## 2021-07-20 LAB — HEPATIC FUNCTION PANEL
ALT: 20 IU/L (ref 0–44)
AST: 29 IU/L (ref 0–40)
Albumin: 3.2 g/dL — ABNORMAL LOW (ref 3.8–4.8)
Alkaline Phosphatase: 156 IU/L — ABNORMAL HIGH (ref 44–121)
Bilirubin Total: 0.4 mg/dL (ref 0.0–1.2)
Bilirubin, Direct: 0.21 mg/dL (ref 0.00–0.40)
Total Protein: 6.3 g/dL (ref 6.0–8.5)

## 2021-07-20 LAB — C-REACTIVE PROTEIN: CRP: 1 mg/L (ref 0–10)

## 2021-07-20 LAB — TSH: TSH: 9.41 u[IU]/mL — ABNORMAL HIGH (ref 0.450–4.500)

## 2021-07-20 LAB — SEDIMENTATION RATE: Sed Rate: 2 mm/hr (ref 0–30)

## 2021-07-20 LAB — VITAMIN B12: Vitamin B-12: 399 pg/mL (ref 232–1245)

## 2021-07-23 ENCOUNTER — Inpatient Hospital Stay (HOSPITAL_COMMUNITY): Payer: BC Managed Care – PPO

## 2021-07-23 ENCOUNTER — Inpatient Hospital Stay (HOSPITAL_COMMUNITY): Payer: BC Managed Care – PPO | Admitting: Dietician

## 2021-07-23 ENCOUNTER — Inpatient Hospital Stay (HOSPITAL_COMMUNITY): Payer: BC Managed Care – PPO | Attending: Hematology | Admitting: Hematology

## 2021-07-23 ENCOUNTER — Other Ambulatory Visit: Payer: Self-pay

## 2021-07-23 VITALS — BP 115/72 | HR 86 | Temp 96.6°F | Resp 18 | Ht 73.35 in | Wt 180.6 lb

## 2021-07-23 VITALS — BP 100/67 | HR 83 | Temp 98.1°F | Resp 18

## 2021-07-23 DIAGNOSIS — Z79899 Other long term (current) drug therapy: Secondary | ICD-10-CM | POA: Diagnosis not present

## 2021-07-23 DIAGNOSIS — K760 Fatty (change of) liver, not elsewhere classified: Secondary | ICD-10-CM | POA: Insufficient documentation

## 2021-07-23 DIAGNOSIS — R5383 Other fatigue: Secondary | ICD-10-CM | POA: Insufficient documentation

## 2021-07-23 DIAGNOSIS — R42 Dizziness and giddiness: Secondary | ICD-10-CM | POA: Insufficient documentation

## 2021-07-23 DIAGNOSIS — R634 Abnormal weight loss: Secondary | ICD-10-CM | POA: Diagnosis not present

## 2021-07-23 DIAGNOSIS — C250 Malignant neoplasm of head of pancreas: Secondary | ICD-10-CM | POA: Diagnosis not present

## 2021-07-23 DIAGNOSIS — Z806 Family history of leukemia: Secondary | ICD-10-CM | POA: Diagnosis not present

## 2021-07-23 DIAGNOSIS — R21 Rash and other nonspecific skin eruption: Secondary | ICD-10-CM | POA: Diagnosis not present

## 2021-07-23 DIAGNOSIS — R197 Diarrhea, unspecified: Secondary | ICD-10-CM | POA: Insufficient documentation

## 2021-07-23 DIAGNOSIS — K8689 Other specified diseases of pancreas: Secondary | ICD-10-CM | POA: Diagnosis not present

## 2021-07-23 DIAGNOSIS — C259 Malignant neoplasm of pancreas, unspecified: Secondary | ICD-10-CM

## 2021-07-23 DIAGNOSIS — Z8249 Family history of ischemic heart disease and other diseases of the circulatory system: Secondary | ICD-10-CM | POA: Insufficient documentation

## 2021-07-23 DIAGNOSIS — K1379 Other lesions of oral mucosa: Secondary | ICD-10-CM | POA: Diagnosis not present

## 2021-07-23 DIAGNOSIS — Z5111 Encounter for antineoplastic chemotherapy: Secondary | ICD-10-CM | POA: Diagnosis not present

## 2021-07-23 DIAGNOSIS — F17210 Nicotine dependence, cigarettes, uncomplicated: Secondary | ICD-10-CM | POA: Diagnosis not present

## 2021-07-23 DIAGNOSIS — E119 Type 2 diabetes mellitus without complications: Secondary | ICD-10-CM | POA: Diagnosis not present

## 2021-07-23 DIAGNOSIS — I10 Essential (primary) hypertension: Secondary | ICD-10-CM | POA: Diagnosis not present

## 2021-07-23 DIAGNOSIS — Z888 Allergy status to other drugs, medicaments and biological substances status: Secondary | ICD-10-CM | POA: Insufficient documentation

## 2021-07-23 DIAGNOSIS — Z9049 Acquired absence of other specified parts of digestive tract: Secondary | ICD-10-CM | POA: Diagnosis not present

## 2021-07-23 DIAGNOSIS — E785 Hyperlipidemia, unspecified: Secondary | ICD-10-CM | POA: Insufficient documentation

## 2021-07-23 DIAGNOSIS — R200 Anesthesia of skin: Secondary | ICD-10-CM | POA: Insufficient documentation

## 2021-07-23 DIAGNOSIS — I719 Aortic aneurysm of unspecified site, without rupture: Secondary | ICD-10-CM | POA: Insufficient documentation

## 2021-07-23 DIAGNOSIS — Z95828 Presence of other vascular implants and grafts: Secondary | ICD-10-CM

## 2021-07-23 LAB — CBC WITH DIFFERENTIAL/PLATELET
Abs Immature Granulocytes: 0.53 10*3/uL — ABNORMAL HIGH (ref 0.00–0.07)
Basophils Absolute: 0.1 10*3/uL (ref 0.0–0.1)
Basophils Relative: 1 %
Eosinophils Absolute: 0.1 10*3/uL (ref 0.0–0.5)
Eosinophils Relative: 1 %
HCT: 36.4 % — ABNORMAL LOW (ref 39.0–52.0)
Hemoglobin: 11.8 g/dL — ABNORMAL LOW (ref 13.0–17.0)
Immature Granulocytes: 8 %
Lymphocytes Relative: 27 %
Lymphs Abs: 1.8 10*3/uL (ref 0.7–4.0)
MCH: 30.6 pg (ref 26.0–34.0)
MCHC: 32.4 g/dL (ref 30.0–36.0)
MCV: 94.5 fL (ref 80.0–100.0)
Monocytes Absolute: 0.5 10*3/uL (ref 0.1–1.0)
Monocytes Relative: 7 %
Neutro Abs: 3.9 10*3/uL (ref 1.7–7.7)
Neutrophils Relative %: 56 %
Platelets: 247 10*3/uL (ref 150–400)
RBC: 3.85 MIL/uL — ABNORMAL LOW (ref 4.22–5.81)
RDW: 15.1 % (ref 11.5–15.5)
WBC Morphology: REACTIVE
WBC: 6.8 10*3/uL (ref 4.0–10.5)
nRBC: 0 % (ref 0.0–0.2)

## 2021-07-23 LAB — COMPREHENSIVE METABOLIC PANEL
ALT: 22 U/L (ref 0–44)
AST: 29 U/L (ref 15–41)
Albumin: 2.5 g/dL — ABNORMAL LOW (ref 3.5–5.0)
Alkaline Phosphatase: 123 U/L (ref 38–126)
Anion gap: 5 (ref 5–15)
BUN: 14 mg/dL (ref 8–23)
CO2: 23 mmol/L (ref 22–32)
Calcium: 7.7 mg/dL — ABNORMAL LOW (ref 8.9–10.3)
Chloride: 108 mmol/L (ref 98–111)
Creatinine, Ser: 0.92 mg/dL (ref 0.61–1.24)
GFR, Estimated: >60 mL/min (ref >60)
Glucose, Bld: 133 mg/dL — ABNORMAL HIGH (ref 70–99)
Potassium: 3.9 mmol/L (ref 3.5–5.1)
Sodium: 136 mmol/L (ref 135–145)
Total Bilirubin: 0.4 mg/dL (ref 0.3–1.2)
Total Protein: 5.9 g/dL — ABNORMAL LOW (ref 6.5–8.1)

## 2021-07-23 LAB — MAGNESIUM: Magnesium: 1.8 mg/dL (ref 1.7–2.4)

## 2021-07-23 MED ORDER — HEPARIN SOD (PORK) LOCK FLUSH 100 UNIT/ML IV SOLN
500.0000 [IU] | Freq: Once | INTRAVENOUS | Status: AC | PRN
  Administered 2021-07-23: 500 [IU]

## 2021-07-23 MED ORDER — SODIUM CHLORIDE 0.9 % IV SOLN
2000.0000 mg | Freq: Once | INTRAVENOUS | Status: AC
  Administered 2021-07-23: 2000 mg via INTRAVENOUS
  Filled 2021-07-23: qty 52.6

## 2021-07-23 MED ORDER — SODIUM CHLORIDE 0.9 % IV SOLN: Freq: Once | INTRAVENOUS | Status: AC

## 2021-07-23 MED ORDER — SODIUM CHLORIDE 0.9 % IV SOLN
8.0000 mg | Freq: Once | INTRAVENOUS | Status: AC
  Administered 2021-07-23: 8 mg via INTRAVENOUS
  Filled 2021-07-23: qty 4

## 2021-07-23 MED ORDER — SODIUM CHLORIDE 0.9% FLUSH
10.0000 mL | INTRAVENOUS | Status: DC | PRN
  Administered 2021-07-23: 10 mL

## 2021-07-23 NOTE — Progress Notes (Signed)
Patient has been examined by Dr. Katragadda, and vital signs and labs have been reviewed. ANC, Creatinine, LFTs, hemoglobin, and platelets are within treatment parameters per M.D. - pt may proceed with treatment.    °

## 2021-07-23 NOTE — Patient Instructions (Addendum)
[place] at [name] ?Discharge Instructions ? ? ?You were seen and examined today by [name]. ? ?He reviewed the results of your lab work which is normal/stable.  ? ?We will proceed with treatment today. ? ?Return as scheduled.  ? ? ? ? ? ?Thank you for choosing [place] at [facility] to provide your oncology and hematology care.  To afford each patient quality time with our provider, please arrive at least 15 minutes before your scheduled appointment time.  ? ?If you have a lab appointment with the [facility] please come in thru the Main Entrance and check in at the main information desk. ? ?You need to re-schedule your appointment should you arrive 10 or more minutes late.  We strive to give you quality time with our providers, and arriving late affects you and other patients whose appointments are after yours.  Also, if you no show three or more times for appointments you may be dismissed from the clinic at the providers discretion.     ?Again, thank you for choosing [facility].  Our hope is that these requests will decrease the amount of time that you wait before being seen by our physicians.       ?_____________________________________________________________ ? ?Should you have questions after your visit to [facility], please contact our office at [phone] and follow the prompts.  Our office hours are 8:00 a.m. and 4:30 p.m. Monday - Friday.  Please note that voicemails left after 4:00 p.m. may not be returned until the following business day.  We are closed weekends and major holidays.  You do have access to a nurse 24-7, just call the main number to the clinic [phone] and do not press any options, hold on the line and a nurse will answer the phone.   ? ?For prescription refill requests, have your pharmacy contact our office and allow 72 hours.   ? ?Due to Covid, you will need to wear a mask upon entering  the hospital. If you do not have a mask, a mask will be given to you at the Main Entrance upon arrival. For doctor visits, patients may have 1 support person age 64 or older with them. For treatment visits, patients can not have anyone with them due to social distancing guidelines and our immunocompromised population.  ? ?   ?

## 2021-07-23 NOTE — Patient Instructions (Signed)
[facility]  Discharge Instructions: ?Thank you for choosing [facility] to provide your oncology and hematology care.  ?If you have a lab appointment with the [facility], please come in thru the Main Entrance and check in at the main information desk. ? ?Wear comfortable clothing and clothing appropriate for easy access to any Portacath or PICC line.  ? ?We strive to give you quality time with your provider. You may need to reschedule your appointment if you arrive late (15 or more minutes).  Arriving late affects you and other patients whose appointments are after yours.  Also, if you miss three or more appointments without notifying the office, you may be dismissed from the clinic at the provider?s discretion.    ?  ?For prescription refill requests, have your pharmacy contact our office and allow 72 hours for refills to be completed.   ? ?Today you received the following chemotherapy and/or immunotherapy agents C1D1 Gemzar ?  ?To help prevent nausea and vomiting after your treatment, we encourage you to take your nausea medication as directed. ? ?BELOW ARE SYMPTOMS THAT SHOULD BE REPORTED IMMEDIATELY: ?*FEVER GREATER THAN 100.4 F (38 ?C) OR HIGHER ?*CHILLS OR SWEATING ?*NAUSEA AND VOMITING THAT IS NOT CONTROLLED WITH YOUR NAUSEA MEDICATION ?*UNUSUAL SHORTNESS OF BREATH ?*UNUSUAL BRUISING OR BLEEDING ?*URINARY PROBLEMS (pain or burning when urinating, or frequent urination) ?*BOWEL PROBLEMS (unusual diarrhea, constipation, pain near the anus) ?TENDERNESS IN MOUTH AND THROAT WITH OR WITHOUT PRESENCE OF ULCERS (sore throat, sores in mouth, or a toothache) ?UNUSUAL RASH, SWELLING OR PAIN  ?UNUSUAL VAGINAL DISCHARGE OR ITCHING  ? ?Items with * indicate a potential emergency and should be followed up as soon as possible or go to the Emergency Department if any problems should occur. ? ?Please show the CHEMOTHERAPY ALERT CARD or IMMUNOTHERAPY ALERT CARD at check-in to the Emergency  Department and triage nurse. ? ?Should you have questions after your visit or need to cancel or reschedule your appointment, please contact [facility] [phone]  and follow the prompts.  Office hours are 8:00 a.m. to 4:30 p.m. Monday - Friday. Please note that voicemails left after 4:00 p.m. may not be returned until the following business day.  We are closed weekends and major holidays. You have access to a nurse at all times for urgent questions. Please call the main number to the clinic [phone] and follow the prompts. ? ?For any non-urgent questions, you may also contact your provider using MyChart. We now offer e-Visits for anyone 77 and older to request care online for non-urgent symptoms. For details visit [email or web address]. ?  ?Also download the MyChart app! Go to the app store, search "MyChart", open the app, select [place], and log in with your MyChart username and password. ? ?Due to Covid, a mask is required upon entering the hospital/clinic. If you do not have a mask, one will be given to you upon arrival. For doctor visits, patients may have 1 support person aged 29 or older with them. For treatment visits, patients cannot have anyone with them due to current Covid guidelines and our immunocompromised population.  ?

## 2021-07-23 NOTE — Progress Notes (Addendum)
Pt presents today C1D1 Gemzar per provider's order. Vital signs and labs WNL for treatment. Okay to proceed with treatment today per Dr.K. New consent signed and obtained for chemotherapy. ? ?C1D1 Gemzar given today per MD orders. Tolerated infusion without adverse affects. Vital signs stable. No complaints at this time. Discharged from clinic ambulatory in stable condition. Alert and oriented x 3. F/U with [facility] as scheduled.   ?

## 2021-07-23 NOTE — Progress Notes (Signed)
[place], [place] [address]   CLINIC:  Medical Oncology/Hematology  PCP:  [name], MD [place] / [place] [address] [phone]   REASON FOR VISIT:  Follow-up for pancreatic adenocarcinoma  PRIOR THERAPY: FOLFIRINOX every 2 weeks x 4 cycles  NGS Results: not done  CURRENT THERAPY: Gemcitabine D1,8,15 + Capecitabine D1-21 q28d x 6 cycles  BRIEF ONCOLOGIC HISTORY:  Oncology History  Pancreatic adenocarcinoma ([place])  12/04/2020 Initial Diagnosis   Pancreatic adenocarcinoma ([place])   01/10/2021 - 03/16/2021 Chemotherapy   Patient is on Treatment Plan : PANCREAS Modified FOLFIRINOX q14d x 4 cycles      Genetic Testing   [facility] CancerNext-Expanded Panel+RNA was Negative. Of note, a variant of uncertain significance was detected in the BRCA1 gene. Report date is 04/23/2021.  The CancerNext-Expanded gene panel offered by [facility] and includes sequencing, rearrangement, and RNA analysis for the following 77 genes: AIP, ALK, APC, ATM, AXIN2, BAP1, BARD1, BLM, BMPR1A, BRCA1, BRCA2, BRIP1, CDC73, CDH1, CDK4, CDKN1B, CDKN2A, CHEK2, CTNNA1, DICER1, FANCC, FH, FLCN, GALNT12, KIF1B, LZTR1, MAX, MEN1, MET, MLH1, MSH2, MSH3, MSH6, MUTYH, NBN, NF1, NF2, NTHL1, PALB2, PHOX2B, PMS2, POT1, PRKAR1A, PTCH1, PTEN, RAD51C, RAD51D, RB1, RECQL, RET, SDHA, SDHAF2, SDHB, SDHC, SDHD, SMAD4, SMARCA4, SMARCB1, SMARCE1, STK11, SUFU, TMEM127, TP53, TSC1, TSC2, VHL and XRCC2 (sequencing and deletion/duplication); EGFR, EGLN1, HOXB13, KIT, MITF, PDGFRA, POLD1, and POLE (sequencing only); EPCAM and GREM1 (deletion/duplication only).    07/23/2021 -  Chemotherapy   Patient is on Treatment Plan : PANCREAS Gemcitabine D1,8,15 + Capecitabine D1-21 q28d x 6 cycles       CANCER STAGING:  Cancer Staging  Pancreatic adenocarcinoma [facility]) Staging form: Exocrine Pancreas, AJCC 8th Edition - Clinical stage from 12/04/2020: Stage IB (cT2, cN0, cM0) -  Unsigned   INTERVAL HISTORY:  Alexander Banks, a 66 y.o. male, returns for routine follow-up and consideration for next cycle of chemotherapy. Alexander Banks was last seen on 07/05/2021.  Due for cycle #1 of gemcitabine today.   Overall, he tells me he has been feeling pretty well. He has lost 1 lb since his last visit. He reports increased energy and activity. His appetite and eating are at baseline. He denies nausea, and he has not required Compazine. He reports cold sensation and numbness in his hands.   Overall, he feels ready to start chemo today.   REVIEW OF SYSTEMS:  Review of Systems  Constitutional:  Negative for appetite change and fatigue.  Gastrointestinal:  Negative for nausea.  Genitourinary:  Positive for frequency.   Neurological:  Positive for numbness (hands).  All other systems reviewed and are negative.  PAST MEDICAL/SURGICAL HISTORY:  Past Medical History:  Diagnosis Date   Anemia, iron deficiency    Diabetes mellitus without complication ([place])    on meds   Hearing loss    Bil hearing aids   Hyperlipidemia    Hypertension    Port-A-Cath in place 12/26/2020   Prediabetes    Sleep apnea    Past Surgical History:  Procedure Laterality Date   BILIARY STENT PLACEMENT N/A 11/09/2020   Procedure: BILIARY STENT PLACEMENT - 66 F X 5 CM STENT;  Surgeon: Rogene Houston, MD;  Location: [facility];  Service: Endoscopy;  Laterality: N/A;   BILIARY STENT PLACEMENT N/A 12/27/2020   Procedure: BILIARY STENT PLACEMENT;  Surgeon: Rogene Houston, MD;  Location: [facility];  Service: Endoscopy;  Laterality: N/A;   COLONOSCOPY  ERCP N/A 11/09/2020   Procedure: ENDOSCOPIC RETROGRADE CHOLANGIOPANCREATOGRAPHY (ERCP);  Surgeon: Rogene Houston, MD;  Location: [facility];  Service: Endoscopy;  Laterality: N/A;   ERCP N/A 12/27/2020   Procedure: ENDOSCOPIC RETROGRADE CHOLANGIOPANCREATOGRAPHY (ERCP);  Surgeon: Rogene Houston, MD;  Location: [facility];  Service: Endoscopy;  Laterality: N/A;    ESOPHAGOGASTRODUODENOSCOPY (EGD) WITH PROPOFOL N/A 11/30/2020   Procedure: ESOPHAGOGASTRODUODENOSCOPY (EGD) WITH PROPOFOL;  Surgeon: Milus Banister, MD;  Location: [facility];  Service: Endoscopy;  Laterality: N/A;   EUS N/A 11/30/2020   Procedure: UPPER ENDOSCOPIC ULTRASOUND (EUS) RADIAL;  Surgeon: Milus Banister, MD;  Location: [facility];  Service: Endoscopy;  Laterality: N/A;   FINE NEEDLE ASPIRATION N/A 11/30/2020   Procedure: FINE NEEDLE ASPIRATION (FNA) LINEAR;  Surgeon: Milus Banister, MD;  Location: [facility];  Service: Endoscopy;  Laterality: N/A;   GIVENS CAPSULE STUDY     IR IMAGING GUIDED PORT INSERTION  12/18/2020   KNEE SURGERY     4 surgeries on left, 2 on right knee   SHOULDER SURGERY     right shoulder   SPHINCTEROTOMY N/A 11/09/2020   Procedure: SHORT BILIARY SPHINCTEROTOMY;  Surgeon: Rogene Houston, MD;  Location: [facility];  Service: Endoscopy;  Laterality: N/A;   WHIPPLE PROCEDURE      SOCIAL HISTORY:  Social History   Socioeconomic History   Marital status: Married    Spouse name: Not on file   Number of children: Not on file   Years of education: Not on file   Highest education level: Not on file  Occupational History   Not on file  Tobacco Use   Smoking status: Every Day    Types: Cigars   Smokeless tobacco: Never   Tobacco comments:    Smoke 3-4 cigars a day  Vaping Use   Vaping Use: Never used  Substance and Sexual Activity   Alcohol use: Yes    Comment: occasional   Drug use: Never   Sexual activity: Not on file  Other Topics Concern   Not on file  Social History Narrative   Not on file   Social Determinants of Health   Financial Resource Strain: Low Risk    Difficulty of Paying Living Expenses: Not hard at all  Food Insecurity: No Food Insecurity   Worried About Charity fundraiser in the Last Year: Never true   [place] in the Last Year: Never true  Transportation Needs: No Transportation Needs   Lack of  Transportation (Medical): No   Lack of Transportation (Non-Medical): No  Physical Activity: Sufficiently Active   Days of Exercise per Week: 5 days   Minutes of Exercise per Session: 30 min  Stress: Not on file  Social Connections: Not on file  Intimate Partner Violence: Not At Risk   Fear of Current or Ex-Partner: No   Emotionally Abused: No   Physically Abused: No   Sexually Abused: No    FAMILY HISTORY:  Family History  Problem Relation Age of Onset   Heart attack Father    Heart attack Paternal Grandfather    Leukemia Sister    Heart attack Brother     CURRENT MEDICATIONS:  Current Outpatient Medications  Medication Sig Dispense Refill   capecitabine (XELODA) 500 MG tablet Take 3 tablets (1,500 mg total) by mouth 2 (two) times daily after a meal. Take for 21 days on, followed by 7 days off. Repeat every 28 days. 126 tablet 0   Continuous Blood  Gluc Receiver (FREESTYLE LIBRE 2 READER) DEVI As directed 1 each 0   Continuous Blood Gluc Sensor (FREESTYLE LIBRE 2 SENSOR) MISC 1 Piece by Does not apply route every 14 (fourteen) days. 6 each 2   Glucosamine-Chondroitin (OSTEO BI-FLEX REGULAR STRENGTH PO) Take 2 tablets by mouth daily.     insulin detemir (LEVEMIR) 100 UNIT/ML FlexPen Inject 8 Units into the skin at bedtime. 15 mL 1   insulin lispro (HUMALOG KWIKPEN) 100 UNIT/ML KwikPen Inject 4-7 Units into the skin 3 (three) times daily before meals. 15 mL 1   IRON-VITAMIN C PO Take 1 tablet by mouth daily.     levothyroxine (SYNTHROID) 175 MCG tablet TAKE 1 TABLET DAILY 90 tablet 1   levothyroxine (SYNTHROID) 25 MCG tablet Take one tablet po daily 90 tablet 0   lipase/protease/amylase (CREON) 36000 UNITS CPEP capsule Take 2 capsules (72,000 Units total) by mouth 3 (three) times daily with meals. May also take 1 capsule (36,000 Units total) as needed (with snacks). 240 capsule 11   oxyCODONE-acetaminophen (PERCOCET/ROXICET) 5-325 MG tablet Take 1 tablet by mouth every 6 (six) hours  as needed for severe pain. 20 tablet 0   sildenafil (VIAGRA) 100 MG tablet 50 mg as needed.     triamcinolone cream (KENALOG) 0.1 % Apply to affected areas BID prn 30 g 3   No current facility-administered medications for this visit.    ALLERGIES:  Allergies  Allergen Reactions   Lisinopril Cough    PHYSICAL EXAM:  Performance status (ECOG): 1 - Symptomatic but completely ambulatory  Vitals:   07/23/21 1106  BP: 115/72  Pulse: 86  Resp: 18  Temp: (!) 96.6 F (35.9 C)  SpO2: 100%   Wt Readings from Last 3 Encounters:  07/23/21 180 lb 9.6 oz (81.9 kg)  07/19/21 184 lb (83.5 kg)  07/12/21 181 lb (82.1 kg)   Physical Exam Vitals reviewed.  Constitutional:      Appearance: Normal appearance.  Cardiovascular:     Rate and Rhythm: Normal rate and regular rhythm.     Pulses: Normal pulses.     Heart sounds: Normal heart sounds.  Pulmonary:     Effort: Pulmonary effort is normal.     Breath sounds: Normal breath sounds.  Neurological:     General: No focal deficit present.     Mental Status: He is alert and oriented to person, place, and time.  Psychiatric:        Mood and Affect: Mood normal.        Behavior: Behavior normal.    LABORATORY DATA:  I have reviewed the labs as listed.  CBC Latest Ref Rng & Units 07/23/2021 06/26/2021 04/30/2021  WBC 4.0 - 10.5 K/uL 6.8 8.6 3.2(L)  Hemoglobin 13.0 - 17.0 g/dL 11.8(L) 12.2(L) 13.6  Hematocrit 39.0 - 52.0 % 36.4(L) 39.6 41.0  Platelets 150 - 400 K/uL 247 448(H) 190   CMP Latest Ref Rng & Units 07/23/2021 07/19/2021 06/26/2021  Glucose 70 - 99 mg/dL 133(H) - 120(H)  BUN 8 - 23 mg/dL 14 - 13  Creatinine 0.61 - 1.24 mg/dL 0.92 - 0.80  Sodium 135 - 145 mmol/L 136 - 138  Potassium 3.5 - 5.1 mmol/L 3.9 - 4.0  Chloride 98 - 111 mmol/L 108 - 105  CO2 22 - 32 mmol/L 23 - 25  Calcium 8.9 - 10.3 mg/dL 7.7(L) - 8.4(L)  Total Protein 6.5 - 8.1 g/dL 5.9(L) 6.3 6.7  Total Bilirubin 0.3 - 1.2 mg/dL 0.4 0.4 0.4  Alkaline  Phos 38 - 126 U/L  123 156(H) 129(H)  AST 15 - 41 U/L 29 29 16   ALT 0 - 44 U/L 22 20 16     DIAGNOSTIC IMAGING:  I have independently reviewed the scans and discussed with the patient. CT CHEST ABDOMEN PELVIS W CONTRAST  Result Date: 07/04/2021 CLINICAL DATA:  Pancreatic cancer, status post Whipple procedure. Diabetes. Hypertension. EXAM: CT CHEST, ABDOMEN, AND PELVIS WITH CONTRAST TECHNIQUE: Multidetector CT imaging of the chest, abdomen and pelvis was performed following the standard protocol during bolus administration of intravenous contrast. RADIATION DOSE REDUCTION: This exam was performed according to the departmental dose-optimization program which includes automated exposure control, adjustment of the mA and/or kV according to patient size and/or use of iterative reconstruction technique. CONTRAST:  103m OMNIPAQUE IOHEXOL 300 MG/ML  SOLN COMPARISON:  03/26/2021 FINDINGS: CT CHEST FINDINGS Cardiovascular: Right Port-A-Cath tip low SVC. Aortic atherosclerosis. Mild ascending aortic dilatation again identified, on the order of 4.1 cm. Tortuous thoracic aorta. Normal heart size, without pericardial effusion. Three vessel coronary artery calcification. No central pulmonary embolism, on this non-dedicated study. Mediastinum/Nodes: No supraclavicular adenopathy. No mediastinal or hilar adenopathy. Lungs/Pleura: No pleural fluid. Mild centrilobular emphysema. Nodule on the right minor fissure of 3 mm on 90/4 is unchanged. A superior segment left lower lobe 2 mm subpleural pulmonary nodule on 57/4 is likely a calcified granuloma. Musculoskeletal: No acute osseous abnormality. CT ABDOMEN PELVIS FINDINGS Hepatobiliary: Moderate hepatic steatosis, without focal liver lesion. Pneumobilia. Choledocho jejunostomy. Pancreas: Status post pancreatic head resection. Atrophy of the remaining pancreas with trace gas within the duct. Soft tissue cephalad to surgical sutures including on 74/2 is favored to represent poorly opacified  jejunum. No peripancreatic edema. Spleen: Normal in size, without focal abnormality. Adrenals/Urinary Tract: Normal adrenal glands. Bilateral too small to characterize renal lesions. No hydronephrosis. Normal urinary bladder. Stomach/Bowel: Gastrojejunostomy. Normal colon and terminal ileum. Presumed appendectomy. Otherwise normal small bowel. Vascular/Lymphatic: Aortic atherosclerosis. Celiac is normal. There is interstitial thickening within the portacaval space and adjacent the right-side of the SMA, including on 72/2. No vascular narrowing. Portacaval node measures 8 mm on 66/2 versus 5 mm on the prior. Not pathologic by size criteria. No pelvic sidewall adenopathy. Reproductive: Normal prostate. Other: No free intraperitoneal air. No significant free fluid. No evidence of omental or peritoneal disease. Musculoskeletal: Degenerative partial fusion of bilateral sacroiliac joints. IMPRESSION: 1. Status post Whipple procedure, without specific evidence of residual or recurrent disease. 2. Interstitial thickening within the portacaval space and along the course of the proximal SMA. Possibly treatment related. Recommend attention on follow-up. 3.  No acute process or evidence of metastatic disease in the chest. 4. Aortic atherosclerosis (ICD10-I70.0), coronary artery atherosclerosis and emphysema (ICD10-J43.9). 5. Mild ascending aortic dilatation at 4.1 cm. Recommend annual imaging followup by CTA or MRA. This recommendation follows 2010 ACCF/AHA/AATS/ACR/ASA/SCA/SCAI/SIR/STS/SVM Guidelines for the Diagnosis and Management of Patients with Thoracic Aortic Disease. Circulation. 2010; 121:: G956-O130 Aortic aneurysm NOS (ICD10-I71.9) 6. Hepatic steatosis. Electronically Signed   By: KAbigail MiyamotoM.D.   On: 07/04/2021 09:21     ASSESSMENT:  1.  Stage I (T2N0) pancreatic adenocarcinoma: - Recent admission to the hospital with DKA, found to have elevated liver enzymes. - MRCP without contrast on 11/08/2020 showed 2.3  x 2 cm central pancreatic head and uncinate mass with intra and extrahepatic biliary ductal dilation. - 11/09/2020-ERCP sphincterotomy and stenting with findings showing short high-grade distal CBD stricture with dilated CBD up to 14 mm.  Bile duct brushing was negative for  malignant cells. - 11/10/2020-CA 19.9-367. - ERCP/EUS by [name] on 11/30/2020 with 2.9 cm mass in the head of the pancreas with unclear outer borders.  Given significant dilatation, indistinct borders, unable to confidently assess major vascular involvement.  Abutment and probable invasion of SMV and portal vein near the confluence with splenic vein.  No obvious SMA or celiac artery involvement.  No other adenopathy. - FNA of the pancreatic mass consistent with adenocarcinoma. - Weight loss intentional 25 pounds between January and April 1.  Additional 25 pound weight loss since 1 April, unintentional. - CT [facility] pancreatic protocol on 12/05/2020 with ill-defined mass in the pancreatic head and uncinate process measuring up to 1.9 x 2.1 cm with associated pancreatic ductal dilatation up to 8 mm.  No enlarged adenopathy.  Teardrop morphology of SMV indicative of SMV involvement.  Replaced right hepatic arterial supply arises from the SMA passing immediately posterior to the tumor. - PET scan on 12/14/2020 with mass in the pancreatic head with hypermetabolic features, SUV 5.3 with no sign of metastatic disease. - 5 cycles of neoadjuvant FOLFIRINOX from 01/10/2021 through 03/14/2021. - Whipple's procedure with IRE by Dr.Ianniti on 05/15/2021. - Pathology consistent with grade 2 moderate adenocarcinoma, 3.7 cm, absent treatment effect, no evidence of tumor regression, margins positive, 6/36 nodes positive, YPT2YPN2. - Adjuvant chemotherapy for 4 months with gemcitabine +capecitabine started on 07/23/2021.  This will be followed by chemoradiation therapy with Xeloda for positive margins (R1 resection).  2.  Social/family history: - He lives at  home with his wife and is independent of all ADLs and IADLs. - He currently works in [employer].  No history of chemical exposure. - He previously smoked cigarettes.  Currently smokes cigars 4/day. - Maternal grandmother died at age 51 with malignancy, unknown type. - Sister died of acute leukemia.    PLAN:  1.  YpT2pN2 pancreatic adenocarcinoma: - CT CAP on 07/04/2021: Showed status post Whipple's procedure with no evidence of residual or recurrent disease.  Interstitial thickening within the portacaval space along the course of the proximal SMA.  No metastatic disease in the chest. - Last CA 19-9 was 64 (06/26/2021). - We have reviewed labs today which showed normal LFTs.  Albumin is low at 2.5.  We have talked about increasing protein intake. - We talked about capecitabine 1500 mg twice daily 3 weeks on/1 week off.  We talked about side effects again.  He started first tablet today. - We talked about gemcitabine and its side effects.  He will receive gemcitabine on days 1, 8, 15 every 28 days. - He will proceed with cycle 1 day 1 today.  We will plan to see him back in 2 weeks for follow-up. - We have reached out to Dr. Basilia Jumbo in [place] to review the CT scans.  2.  Weight loss: - He is taking Megace when he remembers it. - His weight is more or less stable in the last 2 weeks.  3.  Hypomagnesemia: - Continue magnesium 1 tablet daily.  Magnesium is 1.8 today.  4.  Pancreatic insufficiency: - Continue Creon 2 capsules with meals and 1 capsule with snack.   Orders placed this encounter:  No orders of the defined types were placed in this encounter.    Derek Jack, MD [place] [phone]   I, Thana Ates, am acting as a scribe for Dr. Derek Jack.  I, Derek Jack MD, have reviewed the above documentation for accuracy and completeness, and I agree with the  above.

## 2021-07-23 NOTE — Progress Notes (Signed)
Nutrition Follow-up: ? ?Patient completed neoadjuvant FOLFIRINOX for pancreatic cancer. He is s/p Whipple at [facility] with Dr. Basilia Jumbo on 12/27. Patient is currently receiving adjuvant gemcitabine + xeloda.  ? ?Met with patient and wife during infusion. Patient eating lunch, reports cheeseburger with fries from [employer] tasted good and could have eaten more. Patient reports noticeable improvement to appetite, intake, and energy levels the last 3 weeks. Patient is eating 3 meals/day. He does not care for breakfast, but is able to eat a little something most every morning. Today he had a bowl of cereal with whole milk and buttered English muffin. Patient will occasionally snack on handful of nuts or peanut butter crackers in the afternoon. He is eating bedtime snack regularly. This helps maintain stable glucose levels overnight. Patient is taking Creon, 2 with meals and 1 with snacks. He reports diarrhea has resolved, denies gas or abdominal bloating. Patient is drinking flavored water ~40 oz/day and tea.  ? ? ?Medications: Creon ? ?Labs: glucose 133 ? ?Anthropometrics: Weight 180 lb 9.6 oz today  ? ?2/23 - 181 lb ?2/07 - 185 lb 9.6 oz ?1/12 - 192 lb 9.6 oz ?12/12 - 195 lb 9.6 oz ? ? ?NUTRITION DIAGNOSIS: Unintentional weight loss stable x 2 weeks ? ? ?INTERVENTION:  ?Encouraged high calorie high protein foods to promote weight gain/maintenance ?Continue small frequent meals and snacks  ?Continue taking Creon  ?Continue appetite stimulant per MD ?Patient agreeable to start drinking oral nutrition supplement again - recommend 1-2 Ensure Plus/equivalent  ?Patient has contact information  ? ? ? ?MONITORING, EVALUATION, GOAL: weight trends, intake  ? ? ?NEXT VISIT: Monday April 3 during infusion  ? ? ? ?

## 2021-07-24 LAB — CANCER ANTIGEN 19-9: CA 19-9: 108 U/mL — ABNORMAL HIGH (ref 0–35)

## 2021-07-30 ENCOUNTER — Other Ambulatory Visit: Payer: Self-pay

## 2021-07-30 ENCOUNTER — Inpatient Hospital Stay (HOSPITAL_COMMUNITY): Payer: BC Managed Care – PPO

## 2021-07-30 VITALS — BP 129/75 | HR 74 | Temp 97.7°F | Resp 18

## 2021-07-30 DIAGNOSIS — E119 Type 2 diabetes mellitus without complications: Secondary | ICD-10-CM | POA: Diagnosis not present

## 2021-07-30 DIAGNOSIS — R5383 Other fatigue: Secondary | ICD-10-CM | POA: Diagnosis not present

## 2021-07-30 DIAGNOSIS — Z95828 Presence of other vascular implants and grafts: Secondary | ICD-10-CM

## 2021-07-30 DIAGNOSIS — R200 Anesthesia of skin: Secondary | ICD-10-CM | POA: Diagnosis not present

## 2021-07-30 DIAGNOSIS — I10 Essential (primary) hypertension: Secondary | ICD-10-CM | POA: Diagnosis not present

## 2021-07-30 DIAGNOSIS — C259 Malignant neoplasm of pancreas, unspecified: Secondary | ICD-10-CM

## 2021-07-30 DIAGNOSIS — R42 Dizziness and giddiness: Secondary | ICD-10-CM | POA: Diagnosis not present

## 2021-07-30 DIAGNOSIS — Z5111 Encounter for antineoplastic chemotherapy: Secondary | ICD-10-CM | POA: Diagnosis not present

## 2021-07-30 DIAGNOSIS — K1379 Other lesions of oral mucosa: Secondary | ICD-10-CM | POA: Diagnosis not present

## 2021-07-30 DIAGNOSIS — R634 Abnormal weight loss: Secondary | ICD-10-CM | POA: Diagnosis not present

## 2021-07-30 DIAGNOSIS — K8689 Other specified diseases of pancreas: Secondary | ICD-10-CM | POA: Diagnosis not present

## 2021-07-30 DIAGNOSIS — I719 Aortic aneurysm of unspecified site, without rupture: Secondary | ICD-10-CM | POA: Diagnosis not present

## 2021-07-30 DIAGNOSIS — C250 Malignant neoplasm of head of pancreas: Secondary | ICD-10-CM | POA: Diagnosis not present

## 2021-07-30 DIAGNOSIS — R21 Rash and other nonspecific skin eruption: Secondary | ICD-10-CM | POA: Diagnosis not present

## 2021-07-30 DIAGNOSIS — R197 Diarrhea, unspecified: Secondary | ICD-10-CM | POA: Diagnosis not present

## 2021-07-30 DIAGNOSIS — K760 Fatty (change of) liver, not elsewhere classified: Secondary | ICD-10-CM | POA: Diagnosis not present

## 2021-07-30 DIAGNOSIS — E785 Hyperlipidemia, unspecified: Secondary | ICD-10-CM | POA: Diagnosis not present

## 2021-07-30 LAB — COMPREHENSIVE METABOLIC PANEL
ALT: 13 U/L (ref 0–44)
AST: 19 U/L (ref 15–41)
Albumin: 2.4 g/dL — ABNORMAL LOW (ref 3.5–5.0)
Alkaline Phosphatase: 80 U/L (ref 38–126)
Anion gap: 9 (ref 5–15)
BUN: 14 mg/dL (ref 8–23)
CO2: 23 mmol/L (ref 22–32)
Calcium: 8 mg/dL — ABNORMAL LOW (ref 8.9–10.3)
Chloride: 106 mmol/L (ref 98–111)
Creatinine, Ser: 0.76 mg/dL (ref 0.61–1.24)
GFR, Estimated: >60 mL/min (ref >60)
Glucose, Bld: 209 mg/dL — ABNORMAL HIGH (ref 70–99)
Potassium: 3.5 mmol/L (ref 3.5–5.1)
Sodium: 138 mmol/L (ref 135–145)
Total Bilirubin: 0.5 mg/dL (ref 0.3–1.2)
Total Protein: 5.7 g/dL — ABNORMAL LOW (ref 6.5–8.1)

## 2021-07-30 LAB — CBC WITH DIFFERENTIAL/PLATELET
Abs Immature Granulocytes: 0.19 K/uL — ABNORMAL HIGH (ref 0.00–0.07)
Basophils Absolute: 0 K/uL (ref 0.0–0.1)
Basophils Relative: 1 %
Eosinophils Absolute: 0 K/uL (ref 0.0–0.5)
Eosinophils Relative: 1 %
HCT: 32.2 % — ABNORMAL LOW (ref 39.0–52.0)
Hemoglobin: 10 g/dL — ABNORMAL LOW (ref 13.0–17.0)
Immature Granulocytes: 6 %
Lymphocytes Relative: 51 %
Lymphs Abs: 1.6 K/uL (ref 0.7–4.0)
MCH: 29.1 pg (ref 26.0–34.0)
MCHC: 31.1 g/dL (ref 30.0–36.0)
MCV: 93.6 fL (ref 80.0–100.0)
Monocytes Absolute: 0.2 K/uL (ref 0.1–1.0)
Monocytes Relative: 5 %
Neutro Abs: 1.1 K/uL — ABNORMAL LOW (ref 1.7–7.7)
Neutrophils Relative %: 36 %
Platelets: 152 K/uL (ref 150–400)
RBC: 3.44 MIL/uL — ABNORMAL LOW (ref 4.22–5.81)
RDW: 14.9 % (ref 11.5–15.5)
WBC: 3.1 K/uL — ABNORMAL LOW (ref 4.0–10.5)
nRBC: 0 % (ref 0.0–0.2)

## 2021-07-30 LAB — MAGNESIUM: Magnesium: 1.7 mg/dL (ref 1.7–2.4)

## 2021-07-30 MED ORDER — SODIUM CHLORIDE 0.9% FLUSH
10.0000 mL | INTRAVENOUS | Status: DC | PRN
  Administered 2021-07-30: 10 mL

## 2021-07-30 MED ORDER — SODIUM CHLORIDE 0.9 % IV SOLN
2000.0000 mg | Freq: Once | INTRAVENOUS | Status: AC
  Administered 2021-07-30: 2000 mg via INTRAVENOUS
  Filled 2021-07-30: qty 52.6

## 2021-07-30 MED ORDER — SODIUM CHLORIDE 0.9 % IV SOLN
8.0000 mg | Freq: Once | INTRAVENOUS | Status: AC
  Administered 2021-07-30: 8 mg via INTRAVENOUS
  Filled 2021-07-30: qty 4

## 2021-07-30 MED ORDER — SODIUM CHLORIDE 0.9 % IV SOLN: Freq: Once | INTRAVENOUS | Status: AC

## 2021-07-30 MED ORDER — HEPARIN SOD (PORK) LOCK FLUSH 100 UNIT/ML IV SOLN
500.0000 [IU] | Freq: Once | INTRAVENOUS | Status: AC | PRN
  Administered 2021-07-30: 500 [IU]

## 2021-07-30 NOTE — Progress Notes (Signed)
Pt presents today for Gemzar per provider's order. Vital signs and other labs WNL for treatment today. Pt's ANC is 1.1 MD made aware okay to proceed with treatment today. ? ?Pt c/o rash on lower abdomen, inner thighs and buttocks, pt stated he was using anti-itch cream, which lasts 12 hours. MD made aware and assessed pt and stated for pt to continue using anti-itch cream as directed. ? ?Gemzar given today per MD orders. Tolerated infusion without adverse affects. Vital signs stable. No complaints at this time. Discharged from clinic ambulatory in stable condition. Alert and oriented x 3. F/U with [facility] as scheduled.   ?

## 2021-07-30 NOTE — Patient Instructions (Signed)
[facility]  Discharge Instructions: Thank you for choosing [facility] to provide your oncology and hematology care.  If you have a lab appointment with the Cancer Center, please come in thru the Main Entrance and check in at the main information desk.  Wear comfortable clothing and clothing appropriate for easy access to any Portacath or PICC line.   We strive to give you quality time with your provider. You may need to reschedule your appointment if you arrive late (15 or more minutes).  Arriving late affects you and other patients whose appointments are after yours.  Also, if you miss three or more appointments without notifying the office, you may be dismissed from the clinic at the provider's discretion.      For prescription refill requests, have your pharmacy contact our office and allow 72 hours for refills to be completed.    Today you received the following chemotherapy and/or immunotherapy agents Gemzar      To help prevent nausea and vomiting after your treatment, we encourage you to take your nausea medication as directed.  BELOW ARE SYMPTOMS THAT SHOULD BE REPORTED IMMEDIATELY: *FEVER GREATER THAN 100.4 F (38 C) OR HIGHER *CHILLS OR SWEATING *NAUSEA AND VOMITING THAT IS NOT CONTROLLED WITH YOUR NAUSEA MEDICATION *UNUSUAL SHORTNESS OF BREATH *UNUSUAL BRUISING OR BLEEDING *URINARY PROBLEMS (pain or burning when urinating, or frequent urination) *BOWEL PROBLEMS (unusual diarrhea, constipation, pain near the anus) TENDERNESS IN MOUTH AND THROAT WITH OR WITHOUT PRESENCE OF ULCERS (sore throat, sores in mouth, or a toothache) UNUSUAL RASH, SWELLING OR PAIN  UNUSUAL VAGINAL DISCHARGE OR ITCHING   Items with * indicate a potential emergency and should be followed up as soon as possible or go to the Emergency Department if any problems should occur.  Please show the CHEMOTHERAPY ALERT CARD or IMMUNOTHERAPY ALERT CARD at check-in to the Emergency  Department and triage nurse.  Should you have questions after your visit or need to cancel or reschedule your appointment, please contact [facility] [phone]  and follow the prompts.  Office hours are 8:00 a.m. to 4:30 p.m. Monday - Friday. Please note that voicemails left after 4:00 p.m. may not be returned until the following business day.  We are closed weekends and major holidays. You have access to a nurse at all times for urgent questions. Please call the main number to the clinic [phone] and follow the prompts.  For any non-urgent questions, you may also contact your provider using MyChart. We now offer e-Visits for anyone 18 and older to request care online for non-urgent symptoms. For details visit [email or web address].   Also download the MyChart app! Go to the app store, search "MyChart", open the app, select [facility], and log in with your MyChart username and password.  Due to Covid, a mask is required upon entering the hospital/clinic. If you do not have a mask, one will be given to you upon arrival. For doctor visits, patients may have 1 support person aged 18 or older with them. For treatment visits, patients cannot have anyone with them due to current Covid guidelines and our immunocompromised population.  

## 2021-08-03 ENCOUNTER — Other Ambulatory Visit (HOSPITAL_COMMUNITY): Payer: Self-pay | Admitting: Hematology

## 2021-08-03 ENCOUNTER — Other Ambulatory Visit (HOSPITAL_COMMUNITY): Payer: Self-pay

## 2021-08-03 DIAGNOSIS — C259 Malignant neoplasm of pancreas, unspecified: Secondary | ICD-10-CM

## 2021-08-03 MED ORDER — CAPECITABINE 500 MG PO TABS
1500.0000 mg | ORAL_TABLET | Freq: Two times a day (BID) | ORAL | 0 refills | Status: DC
  Filled 2021-08-13: qty 126, 28d supply, fill #0

## 2021-08-05 ENCOUNTER — Telehealth: Payer: Self-pay | Admitting: Family Medicine

## 2021-08-05 NOTE — Telephone Encounter (Signed)
Nurses-please call patient.  Let him know that I did discuss his case with vascular surgery.  They stated that currently the area regarding the aorta being dilated that was seen on the thoracic CT scan does not need surgical intervention.  Typically only if it gets at 5 cm or more.  They recommend a follow-up CT scan of the chest in 1 year.  More than likely he will have this on a regular basis through his cancer doctor anyways.  But nurses-please go ahead and put this into the reminder file for a CTA of the thoracic aorta in 1 year time(obviously if his cancer doctor does he is on a regular basis he would not have to repeat this) ?

## 2021-08-06 ENCOUNTER — Inpatient Hospital Stay (HOSPITAL_COMMUNITY): Payer: BC Managed Care – PPO

## 2021-08-06 ENCOUNTER — Ambulatory Visit (HOSPITAL_COMMUNITY): Payer: BC Managed Care – PPO

## 2021-08-06 ENCOUNTER — Other Ambulatory Visit (HOSPITAL_COMMUNITY): Payer: Self-pay

## 2021-08-06 ENCOUNTER — Inpatient Hospital Stay (HOSPITAL_BASED_OUTPATIENT_CLINIC_OR_DEPARTMENT_OTHER): Payer: BC Managed Care – PPO | Admitting: Hematology

## 2021-08-06 ENCOUNTER — Encounter (HOSPITAL_COMMUNITY): Payer: Self-pay | Admitting: Hematology

## 2021-08-06 ENCOUNTER — Other Ambulatory Visit: Payer: Self-pay

## 2021-08-06 DIAGNOSIS — R197 Diarrhea, unspecified: Secondary | ICD-10-CM | POA: Diagnosis not present

## 2021-08-06 DIAGNOSIS — C259 Malignant neoplasm of pancreas, unspecified: Secondary | ICD-10-CM | POA: Diagnosis not present

## 2021-08-06 DIAGNOSIS — C250 Malignant neoplasm of head of pancreas: Secondary | ICD-10-CM | POA: Diagnosis not present

## 2021-08-06 DIAGNOSIS — I10 Essential (primary) hypertension: Secondary | ICD-10-CM | POA: Diagnosis not present

## 2021-08-06 DIAGNOSIS — I719 Aortic aneurysm of unspecified site, without rupture: Secondary | ICD-10-CM | POA: Diagnosis not present

## 2021-08-06 DIAGNOSIS — E785 Hyperlipidemia, unspecified: Secondary | ICD-10-CM | POA: Diagnosis not present

## 2021-08-06 DIAGNOSIS — R634 Abnormal weight loss: Secondary | ICD-10-CM | POA: Diagnosis not present

## 2021-08-06 DIAGNOSIS — R200 Anesthesia of skin: Secondary | ICD-10-CM | POA: Diagnosis not present

## 2021-08-06 DIAGNOSIS — R5383 Other fatigue: Secondary | ICD-10-CM | POA: Diagnosis not present

## 2021-08-06 DIAGNOSIS — K8689 Other specified diseases of pancreas: Secondary | ICD-10-CM | POA: Diagnosis not present

## 2021-08-06 DIAGNOSIS — R42 Dizziness and giddiness: Secondary | ICD-10-CM | POA: Diagnosis not present

## 2021-08-06 DIAGNOSIS — R21 Rash and other nonspecific skin eruption: Secondary | ICD-10-CM | POA: Diagnosis not present

## 2021-08-06 DIAGNOSIS — Z95828 Presence of other vascular implants and grafts: Secondary | ICD-10-CM

## 2021-08-06 DIAGNOSIS — K1379 Other lesions of oral mucosa: Secondary | ICD-10-CM | POA: Diagnosis not present

## 2021-08-06 DIAGNOSIS — Z5111 Encounter for antineoplastic chemotherapy: Secondary | ICD-10-CM | POA: Diagnosis not present

## 2021-08-06 DIAGNOSIS — K760 Fatty (change of) liver, not elsewhere classified: Secondary | ICD-10-CM | POA: Diagnosis not present

## 2021-08-06 DIAGNOSIS — E119 Type 2 diabetes mellitus without complications: Secondary | ICD-10-CM | POA: Diagnosis not present

## 2021-08-06 LAB — COMPREHENSIVE METABOLIC PANEL
ALT: 13 U/L (ref 0–44)
AST: 17 U/L (ref 15–41)
Albumin: 2.8 g/dL — ABNORMAL LOW (ref 3.5–5.0)
Alkaline Phosphatase: 98 U/L (ref 38–126)
Anion gap: 8 (ref 5–15)
BUN: 12 mg/dL (ref 8–23)
CO2: 24 mmol/L (ref 22–32)
Calcium: 8.1 mg/dL — ABNORMAL LOW (ref 8.9–10.3)
Chloride: 106 mmol/L (ref 98–111)
Creatinine, Ser: 0.92 mg/dL (ref 0.61–1.24)
GFR, Estimated: >60 mL/min (ref >60)
Glucose, Bld: 116 mg/dL — ABNORMAL HIGH (ref 70–99)
Potassium: 3.7 mmol/L (ref 3.5–5.1)
Sodium: 138 mmol/L (ref 135–145)
Total Bilirubin: 0.7 mg/dL (ref 0.3–1.2)
Total Protein: 6.2 g/dL — ABNORMAL LOW (ref 6.5–8.1)

## 2021-08-06 LAB — CBC WITH DIFFERENTIAL/PLATELET
Abs Immature Granulocytes: 0.05 10*3/uL (ref 0.00–0.07)
Basophils Absolute: 0 10*3/uL (ref 0.0–0.1)
Basophils Relative: 1 %
Eosinophils Absolute: 0.1 10*3/uL (ref 0.0–0.5)
Eosinophils Relative: 3 %
HCT: 33.5 % — ABNORMAL LOW (ref 39.0–52.0)
Hemoglobin: 10.9 g/dL — ABNORMAL LOW (ref 13.0–17.0)
Immature Granulocytes: 3 %
Lymphocytes Relative: 75 %
Lymphs Abs: 1.3 10*3/uL (ref 0.7–4.0)
MCH: 30.4 pg (ref 26.0–34.0)
MCHC: 32.5 g/dL (ref 30.0–36.0)
MCV: 93.6 fL (ref 80.0–100.0)
Monocytes Absolute: 0.1 10*3/uL (ref 0.1–1.0)
Monocytes Relative: 5 %
Neutro Abs: 0.2 10*3/uL — CL (ref 1.7–7.7)
Neutrophils Relative %: 13 %
Platelets: 163 10*3/uL (ref 150–400)
RBC: 3.58 MIL/uL — ABNORMAL LOW (ref 4.22–5.81)
RDW: 14.9 % (ref 11.5–15.5)
WBC: 1.8 10*3/uL — ABNORMAL LOW (ref 4.0–10.5)
nRBC: 0 % (ref 0.0–0.2)

## 2021-08-06 LAB — MAGNESIUM: Magnesium: 1.8 mg/dL (ref 1.7–2.4)

## 2021-08-06 MED ORDER — HEPARIN SOD (PORK) LOCK FLUSH 100 UNIT/ML IV SOLN
500.0000 [IU] | Freq: Once | INTRAVENOUS | Status: AC
  Administered 2021-08-06: 500 [IU] via INTRAVENOUS

## 2021-08-06 MED ORDER — SODIUM CHLORIDE 0.9% FLUSH
10.0000 mL | Freq: Once | INTRAVENOUS | Status: AC
  Administered 2021-08-06: 10 mL via INTRAVENOUS

## 2021-08-06 NOTE — Patient Instructions (Addendum)
[place] at [facility] ?Discharge Instructions ? ? ?You were seen and examined today by [name]. ? ?He reviewed your lab results.  Your neutrophil count is very low.  We will hold treatment today.  Dr. Raliegh Ip also wants you to hold Xeloda.  ? ?Return as scheduled in 2 weeks.  ? ? ? ? ?Thank you for choosing [place] at [facility] to provide your oncology and hematology care.  To afford each patient quality time with our provider, please arrive at least 15 minutes before your scheduled appointment time.  ? ?If you have a lab appointment with the [place] please come in thru the Main Entrance and check in at the main information desk. ? ?You need to re-schedule your appointment should you arrive 10 or more minutes late.  We strive to give you quality time with our providers, and arriving late affects you and other patients whose appointments are after yours.  Also, if you no show three or more times for appointments you may be dismissed from the clinic at the providers discretion.     ?Again, thank you for choosing [facility].  Our hope is that these requests will decrease the amount of time that you wait before being seen by our physicians.       ?_____________________________________________________________ ? ?Should you have questions after your visit to [facility], please contact our office at [phone] and follow the prompts.  Our office hours are 8:00 a.m. and 4:30 p.m. Monday - Friday.  Please note that voicemails left after 4:00 p.m. may not be returned until the following business day.  We are closed weekends and major holidays.  You do have access to a nurse 24-7, just call the main number to the clinic [phone] and do not press any options, hold on the line and a nurse will answer the phone.   ? ?For prescription refill requests, have your pharmacy contact our office and allow 72 hours.   ? ?Due to Covid, you  will need to wear a mask upon entering the hospital. If you do not have a mask, a mask will be given to you at the Main Entrance upon arrival. For doctor visits, patients may have 1 support person age 30 or older with them. For treatment visits, patients can not have anyone with them due to social distancing guidelines and our immunocompromised population.  ? ?   ?

## 2021-08-06 NOTE — Patient Instructions (Signed)
[facility]  Discharge Instructions: Thank you for choosing [facility] to provide your oncology and hematology care.  If you have a lab appointment with the Cancer Center, please come in thru the Main Entrance and check in at the main information desk.  Wear comfortable clothing and clothing appropriate for easy access to any Portacath or PICC line.   We strive to give you quality time with your provider. You may need to reschedule your appointment if you arrive late (15 or more minutes).  Arriving late affects you and other patients whose appointments are after yours.  Also, if you miss three or more appointments without notifying the office, you may be dismissed from the clinic at the provider's discretion.      For prescription refill requests, have your pharmacy contact our office and allow 72 hours for refills to be completed.        To help prevent nausea and vomiting after your treatment, we encourage you to take your nausea medication as directed.  BELOW ARE SYMPTOMS THAT SHOULD BE REPORTED IMMEDIATELY: *FEVER GREATER THAN 100.4 F (38 C) OR HIGHER *CHILLS OR SWEATING *NAUSEA AND VOMITING THAT IS NOT CONTROLLED WITH YOUR NAUSEA MEDICATION *UNUSUAL SHORTNESS OF BREATH *UNUSUAL BRUISING OR BLEEDING *URINARY PROBLEMS (pain or burning when urinating, or frequent urination) *BOWEL PROBLEMS (unusual diarrhea, constipation, pain near the anus) TENDERNESS IN MOUTH AND THROAT WITH OR WITHOUT PRESENCE OF ULCERS (sore throat, sores in mouth, or a toothache) UNUSUAL RASH, SWELLING OR PAIN  UNUSUAL VAGINAL DISCHARGE OR ITCHING   Items with * indicate a potential emergency and should be followed up as soon as possible or go to the Emergency Department if any problems should occur.  Please show the CHEMOTHERAPY ALERT CARD or IMMUNOTHERAPY ALERT CARD at check-in to the Emergency Department and triage nurse.  Should you have questions after your visit or need to cancel  or reschedule your appointment, please contact  CANCER CENTER [phone]  and follow the prompts.  Office hours are 8:00 a.m. to 4:30 p.m. Monday - Friday. Please note that voicemails left after 4:00 p.m. may not be returned until the following business day.  We are closed weekends and major holidays. You have access to a nurse at all times for urgent questions. Please call the main number to the clinic [phone] and follow the prompts.  For any non-urgent questions, you may also contact your provider using MyChart. We now offer e-Visits for anyone 18 and older to request care online for non-urgent symptoms. For details visit [email or web address].   Also download the MyChart app! Go to the app store, search "MyChart", open the app, select [place], and log in with your MyChart username and password.  Due to Covid, a mask is required upon entering the hospital/clinic. If you do not have a mask, one will be given to you upon arrival. For doctor visits, patients may have 1 support person aged 18 or older with them. For treatment visits, patients cannot have anyone with them due to current Covid guidelines and our immunocompromised population.  

## 2021-08-06 NOTE — Telephone Encounter (Signed)
Patient advised per Dr Nicki Reaper: Dr Nicki Reaper did discuss his case with vascular surgery.  They stated that currently the area regarding the aorta being dilated that was seen on the thoracic CT scan does not need surgical intervention.  Typically only if it gets at 5 cm or more.  They recommend a follow-up CT scan of the chest in 1 year.  More than likely he will have this on a regular basis through his cancer doctor anyways. ( But nurses-please go ahead and put this into the reminder file for a CTA of the thoracic aorta in 1 year time(obviously if his cancer doctor does he is on a regular basis he would not have to repeat this) ? ?Patient verbalized understanding and patient added to reminder file. ?

## 2021-08-06 NOTE — Progress Notes (Signed)
Patients port flushed without difficulty.  Good blood return noted with no bruising or swelling noted at site.  Band aid applied.  VSS with discharge and left in satisfactory condition with no s/s of distress noted.   

## 2021-08-06 NOTE — Progress Notes (Signed)
Patient is taking Xeloda as prescribed.  He has not missed any doses and reports no side effects at this time.   

## 2021-08-06 NOTE — Progress Notes (Signed)
CRITICAL VALUE ALERT ?Critical value received:  ANC 0.2 ?Date of notification:  08-06-21 ?Time of notification: 9507 ?Critical value read back:  Yes.   ?Nurse who received alert:  C.PageRN ?MD notified time and response:  2257, hold treatment ? ?

## 2021-08-06 NOTE — Progress Notes (Signed)
? ?[facility] ?[address] ?[place], [place] [address] ? ? ?CLINIC:  ?Medical Oncology/Hematology ? ?PCP:  ?[name], MD ?[place] / [place] [address] ?[phone] ? ? ?REASON FOR VISIT:  ?Follow-up for pancreatic adenocarcinoma ? ?PRIOR THERAPY: FOLFIRINOX every 2 weeks x 4 cycles ? ?NGS Results: not done ? ?CURRENT THERAPY: Gemcitabine D1,8,15 + Capecitabine D1-21 q28d x 6 cycles ? ?BRIEF ONCOLOGIC HISTORY:  ?Oncology History  ?Pancreatic adenocarcinoma ([place])  ?12/04/2020 Initial Diagnosis  ? Pancreatic adenocarcinoma ([place]) ?  ?01/10/2021 - 03/16/2021 Chemotherapy  ? Patient is on Treatment Plan : PANCREAS Modified FOLFIRINOX q14d x 4 cycles  ?   ? Genetic Testing  ? [facility] CancerNext-Expanded Panel+RNA was Negative. Of note, a variant of uncertain significance was detected in the BRCA1 gene. Report date is 04/23/2021. ? ?The CancerNext-Expanded gene panel offered by [facility] and includes sequencing, rearrangement, and RNA analysis for the following 77 genes: AIP, ALK, APC, ATM, AXIN2, BAP1, BARD1, BLM, BMPR1A, BRCA1, BRCA2, BRIP1, CDC73, CDH1, CDK4, CDKN1B, CDKN2A, CHEK2, CTNNA1, DICER1, FANCC, FH, FLCN, GALNT12, KIF1B, LZTR1, MAX, MEN1, MET, MLH1, MSH2, MSH3, MSH6, MUTYH, NBN, NF1, NF2, NTHL1, PALB2, PHOX2B, PMS2, POT1, PRKAR1A, PTCH1, PTEN, RAD51C, RAD51D, RB1, RECQL, RET, SDHA, SDHAF2, SDHB, SDHC, SDHD, SMAD4, SMARCA4, SMARCB1, SMARCE1, STK11, SUFU, TMEM127, TP53, TSC1, TSC2, VHL and XRCC2 (sequencing and deletion/duplication); EGFR, EGLN1, HOXB13, KIT, MITF, PDGFRA, POLD1, and POLE (sequencing only); EPCAM and GREM1 (deletion/duplication only).  ?  ?07/23/2021 -  Chemotherapy  ? Patient is on Treatment Plan : PANCREAS Gemcitabine D1,8,15 + Capecitabine D1-21 q28d x 6 cycles  ?   ? ? ?CANCER STAGING: ? Cancer Staging  ?Pancreatic adenocarcinoma ([place]) ?Staging form: Exocrine Pancreas, AJCC 8th Edition ?- Clinical stage from 12/04/2020: Stage IB (cT2, cN0, cM0) -  Unsigned ? ? ?INTERVAL HISTORY:  ?Mr. [name], a 66 y.o. male, returns for routine follow-up and consideration for next cycle of chemotherapy. Brannan was last seen on 07/23/2021. ? ?Due for day #15 cycle #1 of Gemzar today.  ? ?Overall, he tells me he has been feeling fair, and he is accompanied by his wife. He reports mouth sores since 03/15. He reports 1 episode of diarrhea this mornings, and he denies nausea and vomiting. He reports stable fatigue and occasional dizziness upon standing. He has gained 1 lb since his last visit. He is not taking Megace. He continues to take Magnesium. His appetite is poor.  ? ?Overall, he feels ready for next cycle of chemo today.  ? ?REVIEW OF SYSTEMS:  ?Review of Systems  ?Constitutional:  Positive for appetite change and fatigue. Negative for unexpected weight change.  ?HENT:   Positive for mouth sores.   ?Gastrointestinal:  Positive for diarrhea. Negative for nausea and vomiting.  ?Skin:  Positive for rash.  ?Neurological:  Positive for dizziness and numbness.  ?All other systems reviewed and are negative. ? ?PAST MEDICAL/SURGICAL HISTORY:  ?Past Medical History:  ?Diagnosis Date  ? Anemia, iron deficiency   ? Diabetes mellitus without complication ([place])   ? on meds  ? Hearing loss   ? Bil hearing aids  ? Hyperlipidemia   ? Hypertension   ? Port-A-Cath in place 12/26/2020  ? Prediabetes   ? Sleep apnea   ? ?Past Surgical History:  ?Procedure Laterality Date  ? BILIARY STENT PLACEMENT N/A 11/09/2020  ? Procedure: BILIARY STENT PLACEMENT - 33 F X 5 CM STENT;  Surgeon: Rogene Houston, MD;  [facility]: [facility];  Service: Endoscopy;  Laterality: N/A;  ? BILIARY STENT PLACEMENT N/A 12/27/2020  ? Procedure: BILIARY STENT PLACEMENT;  Surgeon: Rogene Houston, MD;  Location: [facility];  Service: Endoscopy;  Laterality: N/A;  ? COLONOSCOPY    ? ERCP N/A 11/09/2020  ? Procedure: ENDOSCOPIC RETROGRADE CHOLANGIOPANCREATOGRAPHY (ERCP);  Surgeon: Rogene Houston, MD;  Location: [facility];   Service: Endoscopy;  Laterality: N/A;  ? ERCP N/A 12/27/2020  ? Procedure: ENDOSCOPIC RETROGRADE CHOLANGIOPANCREATOGRAPHY (ERCP);  Surgeon: Rogene Houston, MD;  Location: [facility];  Service: Endoscopy;  Laterality: N/A;  ? ESOPHAGOGASTRODUODENOSCOPY (EGD) WITH PROPOFOL N/A 11/30/2020  ? Procedure: ESOPHAGOGASTRODUODENOSCOPY (EGD) WITH PROPOFOL;  Surgeon: Milus Banister, MD;  Location: [facility];  Service: Endoscopy;  Laterality: N/A;  ? EUS N/A 11/30/2020  ? Procedure: UPPER ENDOSCOPIC ULTRASOUND (EUS) RADIAL;  Surgeon: Milus Banister, MD;  Location: [facility];  Service: Endoscopy;  Laterality: N/A;  ? FINE NEEDLE ASPIRATION N/A 11/30/2020  ? Procedure: FINE NEEDLE ASPIRATION (FNA) LINEAR;  Surgeon: Milus Banister, MD;  Location: [facility];  Service: Endoscopy;  Laterality: N/A;  ? GIVENS CAPSULE STUDY    ? IR IMAGING GUIDED PORT INSERTION  12/18/2020  ? KNEE SURGERY    ? 4 surgeries on left, 2 on right knee  ? SHOULDER SURGERY    ? right shoulder  ? SPHINCTEROTOMY N/A 11/09/2020  ? Procedure: SHORT BILIARY SPHINCTEROTOMY;  Surgeon: Rogene Houston, MD;  Location: [facility];  Service: Endoscopy;  Laterality: N/A;  ? WHIPPLE PROCEDURE    ? ? ?SOCIAL HISTORY:  ?Social History  ? ?Socioeconomic History  ? Marital status: Married  ?  Spouse name: Not on file  ? Number of children: Not on file  ? Years of education: Not on file  ? Highest education level: Not on file  ?Occupational History  ? Not on file  ?Tobacco Use  ? Smoking status: Every Day  ?  Types: Cigars  ? Smokeless tobacco: Never  ? Tobacco comments:  ?  Smoke 3-4 cigars a day  ?Vaping Use  ? Vaping Use: Never used  ?Substance and Sexual Activity  ? Alcohol use: Yes  ?  Comment: occasional  ? Drug use: Never  ? Sexual activity: Not on file  ?Other Topics Concern  ? Not on file  ?Social History Narrative  ? Not on file  ? ?Social Determinants of Health  ? ?Financial Resource Strain: Low Risk   ? Difficulty of Paying Living Expenses: Not hard at  all  ?Food Insecurity: No Food Insecurity  ? Worried About Charity fundraiser in the Last Year: Never true  ? Ran Out of Food in the Last Year: Never true  ?Transportation Needs: No Transportation Needs  ? Lack of Transportation (Medical): No  ? Lack of Transportation (Non-Medical): No  ?Physical Activity: Sufficiently Active  ? Days of Exercise per Week: 5 days  ? Minutes of Exercise per Session: 30 min  ?Stress: Not on file  ?Social Connections: Not on file  ?Intimate Partner Violence: Not At Risk  ? Fear of Current or Ex-Partner: No  ? Emotionally Abused: No  ? Physically Abused: No  ? Sexually Abused: No  ? ? ?FAMILY HISTORY:  ?Family History  ?Problem Relation Age of Onset  ? Heart attack Father   ? Heart attack Paternal Grandfather   ? Leukemia Sister   ? Heart attack Brother   ? ? ?CURRENT MEDICATIONS:  ?Current Outpatient Medications  ?Medication Sig Dispense Refill  ?  capecitabine (XELODA) 500 MG tablet Take 3 tablets (1,500 mg total) by mouth 2 (two) times daily after a meal. Take for 21 days on, followed by 7 days off. Repeat every 28 days. 126 tablet 0  ? Continuous Blood Gluc Receiver (FREESTYLE LIBRE 2 READER) DEVI As directed 1 each 0  ? Continuous Blood Gluc Sensor (FREESTYLE LIBRE 2 SENSOR) MISC 1 Piece by Does not apply route every 14 (fourteen) days. 6 each 2  ? Glucosamine-Chondroitin (OSTEO BI-FLEX REGULAR STRENGTH PO) Take 2 tablets by mouth daily.    ? insulin detemir (LEVEMIR) 100 UNIT/ML FlexPen Inject 8 Units into the skin at bedtime. 15 mL 1  ? insulin lispro (HUMALOG KWIKPEN) 100 UNIT/ML KwikPen Inject 4-7 Units into the skin 3 (three) times daily before meals. 15 mL 1  ? IRON-VITAMIN C PO Take 1 tablet by mouth daily.    ? levothyroxine (SYNTHROID) 175 MCG tablet TAKE 1 TABLET DAILY 90 tablet 1  ? levothyroxine (SYNTHROID) 25 MCG tablet Take one tablet po daily 90 tablet 0  ? lipase/protease/amylase (CREON) 36000 UNITS CPEP capsule Take 2 capsules (72,000 Units total) by mouth 3 (three)  times daily with meals. May also take 1 capsule (36,000 Units total) as needed (with snacks). 240 capsule 11  ? oxyCODONE-acetaminophen (PERCOCET/ROXICET) 5-325 MG tablet Take 1 tablet by mouth every 6 (six) hou

## 2021-08-13 ENCOUNTER — Other Ambulatory Visit (HOSPITAL_COMMUNITY): Payer: Self-pay

## 2021-08-13 ENCOUNTER — Encounter (HOSPITAL_COMMUNITY): Payer: Self-pay | Admitting: Hematology

## 2021-08-20 ENCOUNTER — Inpatient Hospital Stay (HOSPITAL_BASED_OUTPATIENT_CLINIC_OR_DEPARTMENT_OTHER): Payer: BC Managed Care – PPO | Admitting: Hematology

## 2021-08-20 ENCOUNTER — Inpatient Hospital Stay (HOSPITAL_COMMUNITY): Payer: BC Managed Care – PPO | Admitting: Dietician

## 2021-08-20 ENCOUNTER — Inpatient Hospital Stay (HOSPITAL_COMMUNITY): Payer: BC Managed Care – PPO

## 2021-08-20 ENCOUNTER — Inpatient Hospital Stay (HOSPITAL_COMMUNITY): Payer: BC Managed Care – PPO | Attending: Hematology

## 2021-08-20 VITALS — BP 105/74 | HR 75 | Temp 98.7°F | Resp 18

## 2021-08-20 VITALS — BP 98/64 | HR 87 | Temp 98.3°F | Resp 18 | Ht 73.3 in | Wt 191.5 lb

## 2021-08-20 DIAGNOSIS — C259 Malignant neoplasm of pancreas, unspecified: Secondary | ICD-10-CM | POA: Diagnosis not present

## 2021-08-20 DIAGNOSIS — M79643 Pain in unspecified hand: Secondary | ICD-10-CM | POA: Insufficient documentation

## 2021-08-20 DIAGNOSIS — K8689 Other specified diseases of pancreas: Secondary | ICD-10-CM | POA: Insufficient documentation

## 2021-08-20 DIAGNOSIS — E876 Hypokalemia: Secondary | ICD-10-CM | POA: Diagnosis not present

## 2021-08-20 DIAGNOSIS — R55 Syncope and collapse: Secondary | ICD-10-CM | POA: Insufficient documentation

## 2021-08-20 DIAGNOSIS — R197 Diarrhea, unspecified: Secondary | ICD-10-CM | POA: Insufficient documentation

## 2021-08-20 DIAGNOSIS — E860 Dehydration: Secondary | ICD-10-CM | POA: Insufficient documentation

## 2021-08-20 DIAGNOSIS — F17210 Nicotine dependence, cigarettes, uncomplicated: Secondary | ICD-10-CM | POA: Insufficient documentation

## 2021-08-20 DIAGNOSIS — G4733 Obstructive sleep apnea (adult) (pediatric): Secondary | ICD-10-CM | POA: Diagnosis not present

## 2021-08-20 DIAGNOSIS — E785 Hyperlipidemia, unspecified: Secondary | ICD-10-CM | POA: Insufficient documentation

## 2021-08-20 DIAGNOSIS — C250 Malignant neoplasm of head of pancreas: Secondary | ICD-10-CM | POA: Insufficient documentation

## 2021-08-20 DIAGNOSIS — Z806 Family history of leukemia: Secondary | ICD-10-CM | POA: Diagnosis not present

## 2021-08-20 DIAGNOSIS — R634 Abnormal weight loss: Secondary | ICD-10-CM | POA: Diagnosis not present

## 2021-08-20 DIAGNOSIS — E119 Type 2 diabetes mellitus without complications: Secondary | ICD-10-CM | POA: Insufficient documentation

## 2021-08-20 DIAGNOSIS — R200 Anesthesia of skin: Secondary | ICD-10-CM | POA: Insufficient documentation

## 2021-08-20 DIAGNOSIS — R5383 Other fatigue: Secondary | ICD-10-CM | POA: Diagnosis not present

## 2021-08-20 DIAGNOSIS — Z8249 Family history of ischemic heart disease and other diseases of the circulatory system: Secondary | ICD-10-CM | POA: Diagnosis not present

## 2021-08-20 DIAGNOSIS — Z79899 Other long term (current) drug therapy: Secondary | ICD-10-CM | POA: Diagnosis not present

## 2021-08-20 DIAGNOSIS — T451X5A Adverse effect of antineoplastic and immunosuppressive drugs, initial encounter: Secondary | ICD-10-CM | POA: Insufficient documentation

## 2021-08-20 DIAGNOSIS — I959 Hypotension, unspecified: Secondary | ICD-10-CM | POA: Insufficient documentation

## 2021-08-20 DIAGNOSIS — L271 Localized skin eruption due to drugs and medicaments taken internally: Secondary | ICD-10-CM | POA: Insufficient documentation

## 2021-08-20 DIAGNOSIS — Z95828 Presence of other vascular implants and grafts: Secondary | ICD-10-CM

## 2021-08-20 DIAGNOSIS — Z5111 Encounter for antineoplastic chemotherapy: Secondary | ICD-10-CM | POA: Insufficient documentation

## 2021-08-20 DIAGNOSIS — R42 Dizziness and giddiness: Secondary | ICD-10-CM | POA: Insufficient documentation

## 2021-08-20 LAB — CBC WITH DIFFERENTIAL/PLATELET
Abs Immature Granulocytes: 1.03 10*3/uL — ABNORMAL HIGH (ref 0.00–0.07)
Basophils Absolute: 0.1 10*3/uL (ref 0.0–0.1)
Basophils Relative: 1 %
Eosinophils Absolute: 0.1 10*3/uL (ref 0.0–0.5)
Eosinophils Relative: 1 %
HCT: 34.2 % — ABNORMAL LOW (ref 39.0–52.0)
Hemoglobin: 10.5 g/dL — ABNORMAL LOW (ref 13.0–17.0)
Immature Granulocytes: 12 %
Lymphocytes Relative: 21 %
Lymphs Abs: 1.9 10*3/uL (ref 0.7–4.0)
MCH: 28.8 pg (ref 26.0–34.0)
MCHC: 30.7 g/dL (ref 30.0–36.0)
MCV: 93.7 fL (ref 80.0–100.0)
Monocytes Absolute: 0.7 10*3/uL (ref 0.1–1.0)
Monocytes Relative: 8 %
Neutro Abs: 5.2 10*3/uL (ref 1.7–7.7)
Neutrophils Relative %: 57 %
Platelets: 386 10*3/uL (ref 150–400)
RBC: 3.65 MIL/uL — ABNORMAL LOW (ref 4.22–5.81)
RDW: 18.4 % — ABNORMAL HIGH (ref 11.5–15.5)
WBC: 8.6 10*3/uL (ref 4.0–10.5)
nRBC: 0 % (ref 0.0–0.2)

## 2021-08-20 LAB — COMPREHENSIVE METABOLIC PANEL
ALT: 30 U/L (ref 0–44)
AST: 40 U/L (ref 15–41)
Albumin: 2.4 g/dL — ABNORMAL LOW (ref 3.5–5.0)
Alkaline Phosphatase: 120 U/L (ref 38–126)
Anion gap: 7 (ref 5–15)
BUN: 15 mg/dL (ref 8–23)
CO2: 20 mmol/L — ABNORMAL LOW (ref 22–32)
Calcium: 7.9 mg/dL — ABNORMAL LOW (ref 8.9–10.3)
Chloride: 111 mmol/L (ref 98–111)
Creatinine, Ser: 0.88 mg/dL (ref 0.61–1.24)
GFR, Estimated: >60 mL/min (ref >60)
Glucose, Bld: 160 mg/dL — ABNORMAL HIGH (ref 70–99)
Potassium: 3.5 mmol/L (ref 3.5–5.1)
Sodium: 138 mmol/L (ref 135–145)
Total Bilirubin: 0.5 mg/dL (ref 0.3–1.2)
Total Protein: 5.6 g/dL — ABNORMAL LOW (ref 6.5–8.1)

## 2021-08-20 LAB — MAGNESIUM: Magnesium: 1.9 mg/dL (ref 1.7–2.4)

## 2021-08-20 MED ORDER — SODIUM CHLORIDE 0.9% FLUSH
10.0000 mL | INTRAVENOUS | Status: DC | PRN
  Administered 2021-08-20: 10 mL

## 2021-08-20 MED ORDER — SODIUM CHLORIDE 0.9 % IV SOLN
8.0000 mg | Freq: Once | INTRAVENOUS | Status: AC
  Administered 2021-08-20: 8 mg via INTRAVENOUS
  Filled 2021-08-20: qty 4

## 2021-08-20 MED ORDER — SODIUM CHLORIDE 0.9 % IV SOLN
750.0000 mg/m2 | Freq: Once | INTRAVENOUS | Status: AC
  Administered 2021-08-20: 1558 mg via INTRAVENOUS
  Filled 2021-08-20: qty 40.98

## 2021-08-20 MED ORDER — HEPARIN SOD (PORK) LOCK FLUSH 100 UNIT/ML IV SOLN
500.0000 [IU] | Freq: Once | INTRAVENOUS | Status: AC | PRN
  Administered 2021-08-20: 500 [IU]

## 2021-08-20 MED ORDER — SODIUM CHLORIDE 0.9 % IV SOLN: Freq: Once | INTRAVENOUS | Status: AC

## 2021-08-20 NOTE — Progress Notes (Signed)
Reduce Gemcitabine by 25% for this cycle. ? ?Orders updated to reflect reduction. ? ?T.O. Dr Rhys Martini, PharmD ?

## 2021-08-20 NOTE — Progress Notes (Signed)
Patient has been examined by Dr. Katragadda, and vital signs and labs have been reviewed. ANC, Creatinine, LFTs, hemoglobin, and platelets are within treatment parameters per M.D. - pt may proceed with treatment.    °

## 2021-08-20 NOTE — Progress Notes (Signed)
Patient okay for treatment today per Dr. Katragadda. Patient tolerated chemotherapy with no complaints voiced. Side effects with management reviewed understanding verbalized. Port site clean and dry with no bruising or swelling noted at site. Good blood return noted before and after administration of chemotherapy. Band aid applied. Patient left in satisfactory condition with VSS and no s/s of distress noted.  

## 2021-08-20 NOTE — Patient Instructions (Addendum)
[place] at [facility] ?Discharge Instructions ? ? ?You were seen and examined today by [name]. ? ?He reviewed your lab work which is normal/stable. ? ?We will proceed with your treatment today. ? ?Change Xeloda to 2 weeks on and 1 week off. ? ?Return as scheduled.  ? ? ? ? ?Thank you for choosing [place] at [facility] to provide your oncology and hematology care.  To afford each patient quality time with our provider, please arrive at least 15 minutes before your scheduled appointment time.  ? ?If you have a lab appointment with the [facility] please come in thru the Main Entrance and check in at the main information desk. ? ?You need to re-schedule your appointment should you arrive 10 or more minutes late.  We strive to give you quality time with our providers, and arriving late affects you and other patients whose appointments are after yours.  Also, if you no show three or more times for appointments you may be dismissed from the clinic at the providers discretion.     ?Again, thank you for choosing [facility].  Our hope is that these requests will decrease the amount of time that you wait before being seen by our physicians.       ?_____________________________________________________________ ? ?Should you have questions after your visit to [facility], please contact our office at [phone] and follow the prompts.  Our office hours are 8:00 a.m. and 4:30 p.m. Monday - Friday.  Please note that voicemails left after 4:00 p.m. may not be returned until the following business day.  We are closed weekends and major holidays.  You do have access to a nurse 24-7, just call the main number to the clinic [phone] and do not press any options, hold on the line and a nurse will answer the phone.   ? ?For prescription refill requests, have your pharmacy contact our office and allow 72 hours.   ? ?Due to Covid, you will  need to wear a mask upon entering the hospital. If you do not have a mask, a mask will be given to you at the Main Entrance upon arrival. For doctor visits, patients may have 1 support person age 38 or older with them. For treatment visits, patients can not have anyone with them due to social distancing guidelines and our immunocompromised population.  ? ?   ?

## 2021-08-20 NOTE — Progress Notes (Signed)
Patient is taking Xeloda as prescribed.  He has not missed any doses and reports no side effects at this time.   

## 2021-08-20 NOTE — Patient Instructions (Signed)
[place]  Discharge Instructions: ?Thank you for choosing [facility] to provide your oncology and hematology care.  ?If you have a lab appointment with the [facility] [place], please come in thru the Main Entrance and check in at the main information desk. ? ?Wear comfortable clothing and clothing appropriate for easy access to any Portacath or PICC line.  ? ?We strive to give you quality time with your provider. You may need to reschedule your appointment if you arrive late (15 or more minutes).  Arriving late affects you and other patients whose appointments are after yours.  Also, if you miss three or more appointments without notifying the office, you may be dismissed from the clinic at the provider?s discretion.    ?  ?For prescription refill requests, have your pharmacy contact our office and allow 72 hours for refills to be completed.   ? ?Today you received the following chemotherapy and/or immunotherapy agents Gemcitabine, return as scheduled. ?  ?To help prevent nausea and vomiting after your treatment, we encourage you to take your nausea medication as directed. ? ?BELOW ARE SYMPTOMS THAT SHOULD BE REPORTED IMMEDIATELY: ?*FEVER GREATER THAN 100.4 F (38 ?C) OR HIGHER ?*CHILLS OR SWEATING ?*NAUSEA AND VOMITING THAT IS NOT CONTROLLED WITH YOUR NAUSEA MEDICATION ?*UNUSUAL SHORTNESS OF BREATH ?*UNUSUAL BRUISING OR BLEEDING ?*URINARY PROBLEMS (pain or burning when urinating, or frequent urination) ?*BOWEL PROBLEMS (unusual diarrhea, constipation, pain near the anus) ?TENDERNESS IN MOUTH AND THROAT WITH OR WITHOUT PRESENCE OF ULCERS (sore throat, sores in mouth, or a toothache) ?UNUSUAL RASH, SWELLING OR PAIN  ?UNUSUAL VAGINAL DISCHARGE OR ITCHING  ? ?Items with * indicate a potential emergency and should be followed up as soon as possible or go to the Emergency Department if any problems should occur. ? ?Please show the CHEMOTHERAPY ALERT CARD or IMMUNOTHERAPY ALERT CARD at check-in to  the Emergency Department and triage nurse. ? ?Should you have questions after your visit or need to cancel or reschedule your appointment, please contact [facility] [phone]  and follow the prompts.  Office hours are 8:00 a.m. to 4:30 p.m. Monday - Friday. Please note that voicemails left after 4:00 p.m. may not be returned until the following business day.  We are closed weekends and major holidays. You have access to a nurse at all times for urgent questions. Please call the main number to the clinic [phone] and follow the prompts. ? ?For any non-urgent questions, you may also contact your provider using MyChart. We now offer e-Visits for anyone 34 and older to request care online for non-urgent symptoms. For details visit [email or web address]. ?  ?Also download the MyChart app! Go to the app store, search "MyChart", open the app, select , and log in with your MyChart username and password. ? ?Due to Covid, a mask is required upon entering the hospital/clinic. If you do not have a mask, one will be given to you upon arrival. For doctor visits, patients may have 1 support person aged 6 or older with them. For treatment visits, patients cannot have anyone with them due to current Covid guidelines and our immunocompromised population.  ?

## 2021-08-20 NOTE — Progress Notes (Signed)
? ?[facility] ?[address] ?[place], [place] [address] ? ? ?CLINIC:  ?Medical Oncology/Hematology ? ?PCP:  ?[name], MD ?[place] / [place] [address] ?[phone] ? ? ?REASON FOR VISIT:  ?Follow-up for pancreatic adenocarcinoma ? ?PRIOR THERAPY: FOLFIRINOX every 2 weeks x 4 cycles ? ?NGS Results: not done ? ?CURRENT THERAPY: Gemcitabine D1,8,15 + Capecitabine D1-21 q28d x 6 cycles ? ?BRIEF ONCOLOGIC HISTORY:  ?Oncology History  ?Pancreatic adenocarcinoma ([place])  ?12/04/2020 Initial Diagnosis  ? Pancreatic adenocarcinoma ([place]) ?  ?01/10/2021 - 03/16/2021 Chemotherapy  ? Patient is on Treatment Plan : PANCREAS Modified FOLFIRINOX q14d x 4 cycles  ?   ? Genetic Testing  ? [facility] CancerNext-Expanded Panel+RNA was Negative. Of note, a variant of uncertain significance was detected in the BRCA1 gene. Report date is 04/23/2021. ? ?The CancerNext-Expanded gene panel offered by [facility] and includes sequencing, rearrangement, and RNA analysis for the following 77 genes: AIP, ALK, APC, ATM, AXIN2, BAP1, BARD1, BLM, BMPR1A, BRCA1, BRCA2, BRIP1, CDC73, CDH1, CDK4, CDKN1B, CDKN2A, CHEK2, CTNNA1, DICER1, FANCC, FH, FLCN, GALNT12, KIF1B, LZTR1, MAX, MEN1, MET, MLH1, MSH2, MSH3, MSH6, MUTYH, NBN, NF1, NF2, NTHL1, PALB2, PHOX2B, PMS2, POT1, PRKAR1A, PTCH1, PTEN, RAD51C, RAD51D, RB1, RECQL, RET, SDHA, SDHAF2, SDHB, SDHC, SDHD, SMAD4, SMARCA4, SMARCB1, SMARCE1, STK11, SUFU, TMEM127, TP53, TSC1, TSC2, VHL and XRCC2 (sequencing and deletion/duplication); EGFR, EGLN1, HOXB13, KIT, MITF, PDGFRA, POLD1, and POLE (sequencing only); EPCAM and GREM1 (deletion/duplication only).  ?  ?07/23/2021 -  Chemotherapy  ? Patient is on Treatment Plan : PANCREAS Gemcitabine D1,8,15 + Capecitabine D1-21 q28d x 6 cycles  ?   ? ? ?CANCER STAGING: ? Cancer Staging  ?Pancreatic adenocarcinoma ([place]) ?Staging form: Exocrine Pancreas, AJCC 8th Edition ?- Clinical stage from 12/04/2020: Stage IB (cT2, cN0, cM0) -  Unsigned ? ? ?INTERVAL HISTORY:  ?Mr. [name], a 66 y.o. male, returns for routine follow-up and consideration for next cycle of chemotherapy. Cade was last seen on 08/06/2021. ? ?Due for cycle #2 of Gemcitabine today.  ? ?Overall, he tells me he has been feeling pretty well. He denies mouth sores or skin breakdown on his soles. The numbness in his feet is stable. He reports occasional lightheadedness upon standing. He has gained 10 lbs since his last visit, and his appetite is good. He reports no change in appetite with Megace. He denies diarrhea.  ? ?Overall, he feels ready for next cycle of chemo today.  ? ? ?REVIEW OF SYSTEMS:  ?Review of Systems  ?Constitutional:  Positive for fatigue. Negative for appetite change and unexpected weight change.  ?HENT:   Negative for mouth sores.   ?Gastrointestinal:  Negative for diarrhea.  ?Neurological:  Positive for dizziness and numbness.  ?All other systems reviewed and are negative. ? ?PAST MEDICAL/SURGICAL HISTORY:  ?Past Medical History:  ?Diagnosis Date  ? Anemia, iron deficiency   ? Diabetes mellitus without complication ([place])   ? on meds  ? Hearing loss   ? Bil hearing aids  ? Hyperlipidemia   ? Hypertension   ? Port-A-Cath in place 12/26/2020  ? Prediabetes   ? Sleep apnea   ? ?Past Surgical History:  ?Procedure Laterality Date  ? BILIARY STENT PLACEMENT N/A 11/09/2020  ? Procedure: BILIARY STENT PLACEMENT - 21 F X 5 CM STENT;  Surgeon: Rogene Houston, MD;  Location: [facility];  Service: Endoscopy;  Laterality: N/A;  ? BILIARY STENT PLACEMENT N/A 12/27/2020  ? Procedure: BILIARY STENT PLACEMENT;  Surgeon: [name],  Mechele Dawley, MD;  Location: [facility];  Service: Endoscopy;  Laterality: N/A;  ? COLONOSCOPY    ? ERCP N/A 11/09/2020  ? Procedure: ENDOSCOPIC RETROGRADE CHOLANGIOPANCREATOGRAPHY (ERCP);  Surgeon: Rogene Houston, MD;  Location: [facility];  Service: Endoscopy;  Laterality: N/A;  ? ERCP N/A 12/27/2020  ? Procedure: ENDOSCOPIC RETROGRADE  CHOLANGIOPANCREATOGRAPHY (ERCP);  Surgeon: Rogene Houston, MD;  Location: [facility];  Service: Endoscopy;  Laterality: N/A;  ? ESOPHAGOGASTRODUODENOSCOPY (EGD) WITH PROPOFOL N/A 11/30/2020  ? Procedure: ESOPHAGOGASTRODUODENOSCOPY (EGD) WITH PROPOFOL;  Surgeon: Milus Banister, MD;  Location: [facility];  Service: Endoscopy;  Laterality: N/A;  ? EUS N/A 11/30/2020  ? Procedure: UPPER ENDOSCOPIC ULTRASOUND (EUS) RADIAL;  Surgeon: Milus Banister, MD;  Location: [facility];  Service: Endoscopy;  Laterality: N/A;  ? FINE NEEDLE ASPIRATION N/A 11/30/2020  ? Procedure: FINE NEEDLE ASPIRATION (FNA) LINEAR;  Surgeon: Milus Banister, MD;  Location: [facility];  Service: Endoscopy;  Laterality: N/A;  ? GIVENS CAPSULE STUDY    ? IR IMAGING GUIDED PORT INSERTION  12/18/2020  ? KNEE SURGERY    ? 4 surgeries on left, 2 on right knee  ? SHOULDER SURGERY    ? right shoulder  ? SPHINCTEROTOMY N/A 11/09/2020  ? Procedure: SHORT BILIARY SPHINCTEROTOMY;  Surgeon: Rogene Houston, MD;  Location: [facility];  Service: Endoscopy;  Laterality: N/A;  ? WHIPPLE PROCEDURE    ? ? ?SOCIAL HISTORY:  ?Social History  ? ?Socioeconomic History  ? Marital status: Married  ?  Spouse name: Not on file  ? Number of children: Not on file  ? Years of education: Not on file  ? Highest education level: Not on file  ?Occupational History  ? Not on file  ?Tobacco Use  ? Smoking status: Every Day  ?  Types: Cigars  ? Smokeless tobacco: Never  ? Tobacco comments:  ?  Smoke 3-4 cigars a day  ?Vaping Use  ? Vaping Use: Never used  ?Substance and Sexual Activity  ? Alcohol use: Yes  ?  Comment: occasional  ? Drug use: Never  ? Sexual activity: Not on file  ?Other Topics Concern  ? Not on file  ?Social History Narrative  ? Not on file  ? ?Social Determinants of Health  ? ?Financial Resource Strain: Low Risk   ? Difficulty of Paying Living Expenses: Not hard at all  ?Food Insecurity: No Food Insecurity  ? Worried About Charity fundraiser in the Last Year:  Never true  ? Ran Out of Food in the Last Year: Never true  ?Transportation Needs: No Transportation Needs  ? Lack of Transportation (Medical): No  ? Lack of Transportation (Non-Medical): No  ?Physical Activity: Sufficiently Active  ? Days of Exercise per Week: 5 days  ? Minutes of Exercise per Session: 30 min  ?Stress: Not on file  ?Social Connections: Not on file  ?Intimate Partner Violence: Not At Risk  ? Fear of Current or Ex-Partner: No  ? Emotionally Abused: No  ? Physically Abused: No  ? Sexually Abused: No  ? ? ?FAMILY HISTORY:  ?Family History  ?Problem Relation Age of Onset  ? Heart attack Father   ? Heart attack Paternal Grandfather   ? Leukemia Sister   ? Heart attack Brother   ? ? ?CURRENT MEDICATIONS:  ?Current Outpatient Medications  ?Medication Sig Dispense Refill  ? capecitabine (XELODA) 500 MG tablet Take 3 tablets (1,500 mg total) by mouth 2 (two) times daily after a meal. Take  for 21 days on, followed by 7 days off. Repeat every 28 days. 126 tablet 0  ? Continuous Blood Gluc Receiver (FREESTYLE LIBRE 2 READER) DEVI As directed 1 each 0  ? Continuous Blood Gluc Sensor (FREESTYLE LIBRE 2 SENSOR) MISC 1 Piece by Does not apply route every 14 (fourteen) days. 6 each 2  ? Glucosamine-Chondroitin (OSTEO BI-FLEX REGULAR STRENGTH PO) Take 2 tablets by mouth daily.    ? insulin detemir (LEVEMIR) 100 UNIT/ML FlexPen Inject 8 Units into the skin at bedtime. 15 mL 1  ? insulin lispro (HUMALOG KWIKPEN) 100 UNIT/ML KwikPen Inject 4-7 Units into the skin 3 (three) times daily before meals. 15 mL 1  ? IRON-VITAMIN C PO Take 1 tablet by mouth daily.    ? levothyroxine (SYNTHROID) 175 MCG tablet TAKE 1 TABLET DAILY 90 tablet 1  ? levothyroxine (SYNTHROID) 25 MCG tablet Take one tablet po daily 90 tablet 0  ? lipase/protease/amylase (CREON) 36000 UNITS CPEP capsule Take 2 capsules (72,000 Units total) by mouth 3 (three) times daily with meals. May also take 1 capsule (36,000 Units total) as needed (with snacks).  240 capsule 11  ? oxyCODONE-acetaminophen (PERCOCET/ROXICET) 5-325 MG tablet Take 1 tablet by mouth every 6 (six) hours as needed for severe pain. 20 tablet 0  ? sildenafil (VIAGRA) 100 MG tablet 50 mg as needed.    ? triamcinolone cre

## 2021-08-20 NOTE — Progress Notes (Signed)
Nutrition Follow-up: ? ?Patient completed neoadjuvant FOLFIRINOX for pancreatic cancer. He is s/p [facility] at [facility] with Dr. Basilia Jumbo on 12/27. Patient is currently receiving adjuvant gemcitabine + xeloda.  ?  ?Met with patient and wife during infusion. Patient reports he has had a good week. He has been eating well now that mouth sores have healed. Wife reports patient with 2 weeks of poor intake after first chemotherapy. He is drinking Ensure as well as Ensure Clear as needed. Wife is requesting coupons today. Patient continues to take Creon, sometimes he forgets. He reports occasional gas. Denies diarrhea, abdominal bloating/cramping, floating/oily/pale bowel movements. Patient denies nausea, vomiting, constipation, altered taste. He is hoping to go [place] hunting this weekend.  ? ? ?Medications: reviewed  ? ?Labs: glucose 160 ? ?Anthropometrics: Weight 191 lb 8 oz today increased  ? ?3/20 - 181 lb 2 oz ?3/6 - 180 lb 9.6 oz ?2/16 - 180 lb 5.4 oz  ? ? ?NUTRITION DIAGNOSIS: Unintentional weight loss stable  ? ? ? ?INTERVENTION:  ?Continue eating small frequent meals and snacks  ?Encouraged high calorie, high protein foods  ?Continue drinking Ensure supplements - coupons provided ?Continue taking Creon ?Discussed strategies for sore mouth - handout provided ?Recommend baking soda salt water rinses several times day - recipe provided  ?  ? ?MONITORING, EVALUATION, GOAL: weight trends, intake  ? ? ?NEXT VISIT: Monday May 1 during infusion ? ? ? ?

## 2021-08-21 LAB — CANCER ANTIGEN 19-9: CA 19-9: 105 U/mL — ABNORMAL HIGH (ref 0–35)

## 2021-08-23 ENCOUNTER — Other Ambulatory Visit (HOSPITAL_COMMUNITY): Payer: Self-pay

## 2021-08-27 ENCOUNTER — Inpatient Hospital Stay (HOSPITAL_COMMUNITY): Payer: BC Managed Care – PPO

## 2021-08-27 ENCOUNTER — Other Ambulatory Visit (HOSPITAL_COMMUNITY): Payer: Self-pay

## 2021-08-27 VITALS — BP 108/75 | HR 85 | Temp 98.6°F | Resp 18

## 2021-08-27 DIAGNOSIS — E785 Hyperlipidemia, unspecified: Secondary | ICD-10-CM | POA: Diagnosis not present

## 2021-08-27 DIAGNOSIS — L271 Localized skin eruption due to drugs and medicaments taken internally: Secondary | ICD-10-CM | POA: Diagnosis not present

## 2021-08-27 DIAGNOSIS — T451X5A Adverse effect of antineoplastic and immunosuppressive drugs, initial encounter: Secondary | ICD-10-CM | POA: Diagnosis not present

## 2021-08-27 DIAGNOSIS — I959 Hypotension, unspecified: Secondary | ICD-10-CM | POA: Diagnosis not present

## 2021-08-27 DIAGNOSIS — R200 Anesthesia of skin: Secondary | ICD-10-CM | POA: Diagnosis not present

## 2021-08-27 DIAGNOSIS — G4733 Obstructive sleep apnea (adult) (pediatric): Secondary | ICD-10-CM | POA: Diagnosis not present

## 2021-08-27 DIAGNOSIS — R634 Abnormal weight loss: Secondary | ICD-10-CM | POA: Diagnosis not present

## 2021-08-27 DIAGNOSIS — R42 Dizziness and giddiness: Secondary | ICD-10-CM | POA: Diagnosis not present

## 2021-08-27 DIAGNOSIS — C250 Malignant neoplasm of head of pancreas: Secondary | ICD-10-CM | POA: Diagnosis not present

## 2021-08-27 DIAGNOSIS — C259 Malignant neoplasm of pancreas, unspecified: Secondary | ICD-10-CM

## 2021-08-27 DIAGNOSIS — Z5111 Encounter for antineoplastic chemotherapy: Secondary | ICD-10-CM | POA: Diagnosis not present

## 2021-08-27 DIAGNOSIS — R5383 Other fatigue: Secondary | ICD-10-CM | POA: Diagnosis not present

## 2021-08-27 DIAGNOSIS — R197 Diarrhea, unspecified: Secondary | ICD-10-CM | POA: Diagnosis not present

## 2021-08-27 DIAGNOSIS — K8689 Other specified diseases of pancreas: Secondary | ICD-10-CM | POA: Diagnosis not present

## 2021-08-27 DIAGNOSIS — Z95828 Presence of other vascular implants and grafts: Secondary | ICD-10-CM

## 2021-08-27 DIAGNOSIS — E119 Type 2 diabetes mellitus without complications: Secondary | ICD-10-CM | POA: Diagnosis not present

## 2021-08-27 DIAGNOSIS — E876 Hypokalemia: Secondary | ICD-10-CM | POA: Diagnosis not present

## 2021-08-27 LAB — COMPREHENSIVE METABOLIC PANEL
ALT: 27 U/L (ref 0–44)
AST: 25 U/L (ref 15–41)
Albumin: 2.6 g/dL — ABNORMAL LOW (ref 3.5–5.0)
Alkaline Phosphatase: 133 U/L — ABNORMAL HIGH (ref 38–126)
Anion gap: 4 — ABNORMAL LOW (ref 5–15)
BUN: 17 mg/dL (ref 8–23)
CO2: 21 mmol/L — ABNORMAL LOW (ref 22–32)
Calcium: 8 mg/dL — ABNORMAL LOW (ref 8.9–10.3)
Chloride: 112 mmol/L — ABNORMAL HIGH (ref 98–111)
Creatinine, Ser: 0.92 mg/dL (ref 0.61–1.24)
GFR, Estimated: >60 mL/min (ref >60)
Glucose, Bld: 174 mg/dL — ABNORMAL HIGH (ref 70–99)
Potassium: 4.2 mmol/L (ref 3.5–5.1)
Sodium: 137 mmol/L (ref 135–145)
Total Bilirubin: 0.3 mg/dL (ref 0.3–1.2)
Total Protein: 6 g/dL — ABNORMAL LOW (ref 6.5–8.1)

## 2021-08-27 LAB — CBC WITH DIFFERENTIAL/PLATELET
Abs Immature Granulocytes: 0.01 10*3/uL (ref 0.00–0.07)
Basophils Absolute: 0 10*3/uL (ref 0.0–0.1)
Basophils Relative: 1 %
Eosinophils Absolute: 0 10*3/uL (ref 0.0–0.5)
Eosinophils Relative: 0 %
HCT: 32.6 % — ABNORMAL LOW (ref 39.0–52.0)
Hemoglobin: 10.2 g/dL — ABNORMAL LOW (ref 13.0–17.0)
Immature Granulocytes: 0 %
Lymphocytes Relative: 60 %
Lymphs Abs: 1.6 10*3/uL (ref 0.7–4.0)
MCH: 29.2 pg (ref 26.0–34.0)
MCHC: 31.3 g/dL (ref 30.0–36.0)
MCV: 93.4 fL (ref 80.0–100.0)
Monocytes Absolute: 0.3 10*3/uL (ref 0.1–1.0)
Monocytes Relative: 12 %
Neutro Abs: 0.8 10*3/uL — ABNORMAL LOW (ref 1.7–7.7)
Neutrophils Relative %: 27 %
Platelets: 212 10*3/uL (ref 150–400)
RBC: 3.49 MIL/uL — ABNORMAL LOW (ref 4.22–5.81)
RDW: 17.6 % — ABNORMAL HIGH (ref 11.5–15.5)
WBC: 2.7 10*3/uL — ABNORMAL LOW (ref 4.0–10.5)
nRBC: 0 % (ref 0.0–0.2)

## 2021-08-27 LAB — MAGNESIUM: Magnesium: 2.1 mg/dL (ref 1.7–2.4)

## 2021-08-27 MED ORDER — HEPARIN SOD (PORK) LOCK FLUSH 100 UNIT/ML IV SOLN
500.0000 [IU] | Freq: Once | INTRAVENOUS | Status: AC | PRN
  Administered 2021-08-27: 500 [IU]

## 2021-08-27 MED ORDER — SODIUM CHLORIDE 0.9 % IV SOLN: Freq: Once | INTRAVENOUS | Status: DC

## 2021-08-27 MED ORDER — SODIUM CHLORIDE 0.9 % IV SOLN: Freq: Once | INTRAVENOUS | Status: AC

## 2021-08-27 MED ORDER — SODIUM CHLORIDE 0.9 % IV SOLN
8.0000 mg | Freq: Once | INTRAVENOUS | Status: AC
  Administered 2021-08-27: 8 mg via INTRAVENOUS
  Filled 2021-08-27: qty 4

## 2021-08-27 MED ORDER — SODIUM CHLORIDE 0.9 % IV SOLN
750.0000 mg/m2 | Freq: Once | INTRAVENOUS | Status: AC
  Administered 2021-08-27: 1558 mg via INTRAVENOUS
  Filled 2021-08-27: qty 40.98

## 2021-08-27 MED ORDER — SODIUM CHLORIDE 0.9% FLUSH
10.0000 mL | INTRAVENOUS | Status: DC | PRN
  Administered 2021-08-27: 10 mL

## 2021-08-27 NOTE — Progress Notes (Signed)
OK to treat with ANC 800 today.  Dose of Gemzar to be given at 750 mg/m2 ? ?T.O. Dr Rhys Martini, PharmD ?

## 2021-08-27 NOTE — Progress Notes (Signed)
Patient presents today for Gemzar infusion per providers order.  Vital signs and labs within parameters for treatment.   ? ?Gemzar infusion given today per MD orders.  Stable during infusion without adverse affects.  Vital signs stable.  No complaints at this time.  Discharge from clinic ambulatory in stable condition.  Alert and oriented X 3.  Follow up with [facility] as scheduled.  ?

## 2021-08-27 NOTE — Patient Instructions (Signed)
[facility]  Discharge Instructions: Thank you for choosing [facility] to provide your oncology and hematology care.  If you have a lab appointment with the Cancer Center, please come in thru the Main Entrance and check in at the main information desk.  Wear comfortable clothing and clothing appropriate for easy access to any Portacath or PICC line.   We strive to give you quality time with your provider. You may need to reschedule your appointment if you arrive late (15 or more minutes).  Arriving late affects you and other patients whose appointments are after yours.  Also, if you miss three or more appointments without notifying the office, you may be dismissed from the clinic at the provider's discretion.      For prescription refill requests, have your pharmacy contact our office and allow 72 hours for refills to be completed.    Today you received the following chemotherapy and/or immunotherapy agents Gemzar      To help prevent nausea and vomiting after your treatment, we encourage you to take your nausea medication as directed.  BELOW ARE SYMPTOMS THAT SHOULD BE REPORTED IMMEDIATELY: *FEVER GREATER THAN 100.4 F (38 C) OR HIGHER *CHILLS OR SWEATING *NAUSEA AND VOMITING THAT IS NOT CONTROLLED WITH YOUR NAUSEA MEDICATION *UNUSUAL SHORTNESS OF BREATH *UNUSUAL BRUISING OR BLEEDING *URINARY PROBLEMS (pain or burning when urinating, or frequent urination) *BOWEL PROBLEMS (unusual diarrhea, constipation, pain near the anus) TENDERNESS IN MOUTH AND THROAT WITH OR WITHOUT PRESENCE OF ULCERS (sore throat, sores in mouth, or a toothache) UNUSUAL RASH, SWELLING OR PAIN  UNUSUAL VAGINAL DISCHARGE OR ITCHING   Items with * indicate a potential emergency and should be followed up as soon as possible or go to the Emergency Department if any problems should occur.  Please show the CHEMOTHERAPY ALERT CARD or IMMUNOTHERAPY ALERT CARD at check-in to the Emergency  Department and triage nurse.  Should you have questions after your visit or need to cancel or reschedule your appointment, please contact  CANCER CENTER [phone]  and follow the prompts.  Office hours are 8:00 a.m. to 4:30 p.m. Monday - Friday. Please note that voicemails left after 4:00 p.m. may not be returned until the following business day.  We are closed weekends and major holidays. You have access to a nurse at all times for urgent questions. Please call the main number to the clinic [phone] and follow the prompts.  For any non-urgent questions, you may also contact your provider using MyChart. We now offer e-Visits for anyone 18 and older to request care online for non-urgent symptoms. For details visit [email or web address].   Also download the MyChart app! Go to the app store, search "MyChart", open the app, select , and log in with your MyChart username and password.  Due to Covid, a mask is required upon entering the hospital/clinic. If you do not have a mask, one will be given to you upon arrival. For doctor visits, patients may have 1 support person aged 18 or older with them. For treatment visits, patients cannot have anyone with them due to current Covid guidelines and our immunocompromised population.  

## 2021-08-30 ENCOUNTER — Encounter: Payer: Self-pay | Admitting: Family Medicine

## 2021-08-30 ENCOUNTER — Other Ambulatory Visit: Payer: Self-pay | Admitting: Family Medicine

## 2021-08-30 MED ORDER — LEVOTHYROXINE SODIUM 25 MCG PO TABS: ORAL_TABLET | ORAL | 1 refills | Status: DC

## 2021-08-31 ENCOUNTER — Other Ambulatory Visit: Payer: Self-pay | Admitting: *Deleted

## 2021-08-31 DIAGNOSIS — E039 Hypothyroidism, unspecified: Secondary | ICD-10-CM

## 2021-09-03 ENCOUNTER — Inpatient Hospital Stay (HOSPITAL_COMMUNITY): Payer: BC Managed Care – PPO

## 2021-09-03 ENCOUNTER — Other Ambulatory Visit (HOSPITAL_COMMUNITY): Payer: Self-pay | Admitting: Hematology

## 2021-09-03 ENCOUNTER — Encounter: Payer: Self-pay | Admitting: Family Medicine

## 2021-09-03 VITALS — BP 120/80 | HR 72 | Temp 97.8°F | Resp 18

## 2021-09-03 DIAGNOSIS — I959 Hypotension, unspecified: Secondary | ICD-10-CM | POA: Diagnosis not present

## 2021-09-03 DIAGNOSIS — R42 Dizziness and giddiness: Secondary | ICD-10-CM | POA: Diagnosis not present

## 2021-09-03 DIAGNOSIS — T451X5A Adverse effect of antineoplastic and immunosuppressive drugs, initial encounter: Secondary | ICD-10-CM | POA: Diagnosis not present

## 2021-09-03 DIAGNOSIS — C250 Malignant neoplasm of head of pancreas: Secondary | ICD-10-CM | POA: Diagnosis not present

## 2021-09-03 DIAGNOSIS — R197 Diarrhea, unspecified: Secondary | ICD-10-CM | POA: Diagnosis not present

## 2021-09-03 DIAGNOSIS — R634 Abnormal weight loss: Secondary | ICD-10-CM | POA: Diagnosis not present

## 2021-09-03 DIAGNOSIS — G4733 Obstructive sleep apnea (adult) (pediatric): Secondary | ICD-10-CM | POA: Diagnosis not present

## 2021-09-03 DIAGNOSIS — E785 Hyperlipidemia, unspecified: Secondary | ICD-10-CM | POA: Diagnosis not present

## 2021-09-03 DIAGNOSIS — L271 Localized skin eruption due to drugs and medicaments taken internally: Secondary | ICD-10-CM | POA: Diagnosis not present

## 2021-09-03 DIAGNOSIS — E876 Hypokalemia: Secondary | ICD-10-CM | POA: Diagnosis not present

## 2021-09-03 DIAGNOSIS — C259 Malignant neoplasm of pancreas, unspecified: Secondary | ICD-10-CM

## 2021-09-03 DIAGNOSIS — R5383 Other fatigue: Secondary | ICD-10-CM | POA: Diagnosis not present

## 2021-09-03 DIAGNOSIS — Z95828 Presence of other vascular implants and grafts: Secondary | ICD-10-CM

## 2021-09-03 DIAGNOSIS — K8689 Other specified diseases of pancreas: Secondary | ICD-10-CM | POA: Diagnosis not present

## 2021-09-03 DIAGNOSIS — R200 Anesthesia of skin: Secondary | ICD-10-CM | POA: Diagnosis not present

## 2021-09-03 DIAGNOSIS — E119 Type 2 diabetes mellitus without complications: Secondary | ICD-10-CM | POA: Diagnosis not present

## 2021-09-03 DIAGNOSIS — Z5111 Encounter for antineoplastic chemotherapy: Secondary | ICD-10-CM | POA: Diagnosis not present

## 2021-09-03 LAB — CBC WITH DIFFERENTIAL/PLATELET
Abs Immature Granulocytes: 0.02 10*3/uL (ref 0.00–0.07)
Basophils Absolute: 0 10*3/uL (ref 0.0–0.1)
Basophils Relative: 0 %
Eosinophils Absolute: 0.1 10*3/uL (ref 0.0–0.5)
Eosinophils Relative: 3 %
HCT: 29.6 % — ABNORMAL LOW (ref 39.0–52.0)
Hemoglobin: 9.8 g/dL — ABNORMAL LOW (ref 13.0–17.0)
Immature Granulocytes: 1 %
Lymphocytes Relative: 40 %
Lymphs Abs: 1.7 10*3/uL (ref 0.7–4.0)
MCH: 30.9 pg (ref 26.0–34.0)
MCHC: 33.1 g/dL (ref 30.0–36.0)
MCV: 93.4 fL (ref 80.0–100.0)
Monocytes Absolute: 0.3 10*3/uL (ref 0.1–1.0)
Monocytes Relative: 7 %
Neutro Abs: 2 10*3/uL (ref 1.7–7.7)
Neutrophils Relative %: 49 %
Platelets: 183 10*3/uL (ref 150–400)
RBC: 3.17 MIL/uL — ABNORMAL LOW (ref 4.22–5.81)
RDW: 17.3 % — ABNORMAL HIGH (ref 11.5–15.5)
WBC: 4.2 10*3/uL (ref 4.0–10.5)
nRBC: 0 % (ref 0.0–0.2)

## 2021-09-03 LAB — COMPREHENSIVE METABOLIC PANEL
ALT: 17 U/L (ref 0–44)
AST: 16 U/L (ref 15–41)
Albumin: 3 g/dL — ABNORMAL LOW (ref 3.5–5.0)
Alkaline Phosphatase: 111 U/L (ref 38–126)
Anion gap: 7 (ref 5–15)
BUN: 16 mg/dL (ref 8–23)
CO2: 21 mmol/L — ABNORMAL LOW (ref 22–32)
Calcium: 8.3 mg/dL — ABNORMAL LOW (ref 8.9–10.3)
Chloride: 110 mmol/L (ref 98–111)
Creatinine, Ser: 1.04 mg/dL (ref 0.61–1.24)
GFR, Estimated: >60 mL/min (ref >60)
Glucose, Bld: 112 mg/dL — ABNORMAL HIGH (ref 70–99)
Potassium: 4.1 mmol/L (ref 3.5–5.1)
Sodium: 138 mmol/L (ref 135–145)
Total Bilirubin: 0.6 mg/dL (ref 0.3–1.2)
Total Protein: 6.4 g/dL — ABNORMAL LOW (ref 6.5–8.1)

## 2021-09-03 LAB — MAGNESIUM: Magnesium: 1.8 mg/dL (ref 1.7–2.4)

## 2021-09-03 MED ORDER — MAGNESIUM SULFATE 2 GM/50ML IV SOLN
2.0000 g | Freq: Once | INTRAVENOUS | Status: AC
  Administered 2021-09-03: 2 g via INTRAVENOUS
  Filled 2021-09-03: qty 50

## 2021-09-03 MED ORDER — HEPARIN SOD (PORK) LOCK FLUSH 100 UNIT/ML IV SOLN
500.0000 [IU] | Freq: Once | INTRAVENOUS | Status: AC | PRN
  Administered 2021-09-03: 500 [IU]

## 2021-09-03 MED ORDER — SODIUM CHLORIDE 0.9 % IV SOLN: Freq: Once | INTRAVENOUS | Status: AC

## 2021-09-03 MED ORDER — SODIUM CHLORIDE 0.9% FLUSH
10.0000 mL | INTRAVENOUS | Status: DC | PRN
  Administered 2021-09-03: 10 mL

## 2021-09-03 MED ORDER — SODIUM CHLORIDE 0.9 % IV SOLN
8.0000 mg | Freq: Once | INTRAVENOUS | Status: AC
  Administered 2021-09-03: 8 mg via INTRAVENOUS
  Filled 2021-09-03: qty 4

## 2021-09-03 MED ORDER — POTASSIUM CHLORIDE IN NACL 20-0.9 MEQ/L-% IV SOLN
Freq: Once | INTRAVENOUS | Status: AC
  Filled 2021-09-03: qty 1000

## 2021-09-03 MED ORDER — SODIUM CHLORIDE 0.9 % IV SOLN
750.0000 mg/m2 | Freq: Once | INTRAVENOUS | Status: AC
  Administered 2021-09-03: 1558 mg via INTRAVENOUS
  Filled 2021-09-03: qty 40.98

## 2021-09-03 MED ORDER — CLOBETASOL PROPIONATE 0.05 % EX CREA: 1.0000 "application " | TOPICAL_CREAM | Freq: Two times a day (BID) | CUTANEOUS | 3 refills | Status: DC

## 2021-09-03 MED ORDER — DIPHENOXYLATE-ATROPINE 2.5-0.025 MG PO TABS: 1.0000 | ORAL_TABLET | Freq: Four times a day (QID) | ORAL | 0 refills | Status: DC | PRN

## 2021-09-03 NOTE — Patient Instructions (Signed)
[facility]  Discharge Instructions: ?Thank you for choosing [facility] to provide your oncology and hematology care.  ?If you have a lab appointment with the [facility], please come in thru the Main Entrance and check in at the main information desk. ? ?Wear comfortable clothing and clothing appropriate for easy access to any Portacath or PICC line.  ? ?We strive to give you quality time with your provider. You may need to reschedule your appointment if you arrive late (15 or more minutes).  Arriving late affects you and other patients whose appointments are after yours.  Also, if you miss three or more appointments without notifying the office, you may be dismissed from the clinic at the provider?s discretion.    ?  ?For prescription refill requests, have your pharmacy contact our office and allow 72 hours for refills to be completed.   ? ?Today you received the following chemotherapy and/or immunotherapy agents Gemzar and house fluids    ?  ?To help prevent nausea and vomiting after your treatment, we encourage you to take your nausea medication as directed. ? ?BELOW ARE SYMPTOMS THAT SHOULD BE REPORTED IMMEDIATELY: ?*FEVER GREATER THAN 100.4 F (38 ?C) OR HIGHER ?*CHILLS OR SWEATING ?*NAUSEA AND VOMITING THAT IS NOT CONTROLLED WITH YOUR NAUSEA MEDICATION ?*UNUSUAL SHORTNESS OF BREATH ?*UNUSUAL BRUISING OR BLEEDING ?*URINARY PROBLEMS (pain or burning when urinating, or frequent urination) ?*BOWEL PROBLEMS (unusual diarrhea, constipation, pain near the anus) ?TENDERNESS IN MOUTH AND THROAT WITH OR WITHOUT PRESENCE OF ULCERS (sore throat, sores in mouth, or a toothache) ?UNUSUAL RASH, SWELLING OR PAIN  ?UNUSUAL VAGINAL DISCHARGE OR ITCHING  ? ?Items with * indicate a potential emergency and should be followed up as soon as possible or go to the Emergency Department if any problems should occur. ? ?Please show the CHEMOTHERAPY ALERT CARD or IMMUNOTHERAPY ALERT CARD at check-in to the  Emergency Department and triage nurse. ? ?Should you have questions after your visit or need to cancel or reschedule your appointment, please contact [facility] [phone]  and follow the prompts.  Office hours are 8:00 a.m. to 4:30 p.m. Monday - Friday. Please note that voicemails left after 4:00 p.m. may not be returned until the following business day.  We are closed weekends and major holidays. You have access to a nurse at all times for urgent questions. Please call the main number to the clinic [phone] and follow the prompts. ? ?For any non-urgent questions, you may also contact your provider using MyChart. We now offer e-Visits for anyone 24 and older to request care online for non-urgent symptoms. For details visit [email or web address]. ?  ?Also download the MyChart app! Go to the app store, search "MyChart", open the app, select [facility], and log in with your MyChart username and password. ? ?Due to Covid, a mask is required upon entering the hospital/clinic. If you do not have a mask, one will be given to you upon arrival. For doctor visits, patients may have 1 support person aged 64 or older with them. For treatment visits, patients cannot have anyone with them due to current Covid guidelines and our immunocompromised population.  ?

## 2021-09-03 NOTE — Progress Notes (Signed)
Patient presents today for Gemzar infusion per providers order.  Vital signs within parameters for treatment.  Labs pending.  Patient has had diarrhea since last Thursday, has been taking imodium without relief.  Patient has also has soreness to the soles of the feet and a rash and cracking to the skin on his bilateral hands.  MD notified and per MD patient is to stop taking Xeloda and prescriptions for  Lomotil and Clobetasol cream were called into the patients pharmacy.   ? ?Message received from [name] okay to proceed with Gemzar and the patient will also get one liter of house fluids with treatment. ? ?Gemzar infusion and house fluids given today per MD orders.  Stable during infusion without adverse affects.  Vital signs stable.  No complaints at this time.  Discharge from clinic ambulatory in stable condition.  Alert and oriented X 3.  Follow up with [facility] as scheduled.  ?

## 2021-09-03 NOTE — Telephone Encounter (Signed)
Nurses ?Viagra is for erectile dysfunction ?Some insurance companies will cover it some will not ? ?But if not covered ?I would recommend that they price shop with pharmacies regarding pricing of generic sildenafil ?Certainly they could price check with their pharmacy ?Other pharmacies that they give reasonable generic pricing ?[facility] ?[facility] ?[facility]-also advertises that they can do Futures trader for this medicine-and mail rx etc ?If it is covered by his insurance that is great ?If they do not cover it then ?Certainly if he needs prescription sent to a different pharmacy or printed for him let us know ?

## 2021-09-03 NOTE — Telephone Encounter (Signed)
Insurance to fax over the PA form(website currently not working) ?

## 2021-09-06 ENCOUNTER — Other Ambulatory Visit (HOSPITAL_COMMUNITY): Payer: Self-pay

## 2021-09-10 ENCOUNTER — Other Ambulatory Visit (HOSPITAL_COMMUNITY): Payer: Self-pay | Admitting: *Deleted

## 2021-09-10 ENCOUNTER — Ambulatory Visit (HOSPITAL_COMMUNITY): Payer: BC Managed Care – PPO | Admitting: Hematology

## 2021-09-10 DIAGNOSIS — E860 Dehydration: Secondary | ICD-10-CM

## 2021-09-10 DIAGNOSIS — C259 Malignant neoplasm of pancreas, unspecified: Secondary | ICD-10-CM

## 2021-09-10 NOTE — Progress Notes (Signed)
Patient's wife called stating that he had severe edema and pain in bilateral feet last week following treatment.  He also passed out at some point, sustaining no apparent injuries.  Advises that he has lost 10 additional pounds and is not taking in adequate po.  Diarrhea continues off and on, as well.  Appointment made for tomorrow for PF/Lab, symptom management with Tarri Abernethy- PAC and possible IV fluids. Wife made aware of appointments. ?

## 2021-09-11 ENCOUNTER — Inpatient Hospital Stay (HOSPITAL_BASED_OUTPATIENT_CLINIC_OR_DEPARTMENT_OTHER): Payer: BC Managed Care – PPO | Admitting: Physician Assistant

## 2021-09-11 ENCOUNTER — Inpatient Hospital Stay (HOSPITAL_COMMUNITY): Payer: BC Managed Care – PPO

## 2021-09-11 VITALS — BP 101/58 | HR 85 | Temp 98.0°F | Resp 18

## 2021-09-11 DIAGNOSIS — E785 Hyperlipidemia, unspecified: Secondary | ICD-10-CM | POA: Diagnosis not present

## 2021-09-11 DIAGNOSIS — L271 Localized skin eruption due to drugs and medicaments taken internally: Secondary | ICD-10-CM | POA: Diagnosis not present

## 2021-09-11 DIAGNOSIS — R42 Dizziness and giddiness: Secondary | ICD-10-CM | POA: Diagnosis not present

## 2021-09-11 DIAGNOSIS — E860 Dehydration: Secondary | ICD-10-CM

## 2021-09-11 DIAGNOSIS — R634 Abnormal weight loss: Secondary | ICD-10-CM

## 2021-09-11 DIAGNOSIS — C259 Malignant neoplasm of pancreas, unspecified: Secondary | ICD-10-CM

## 2021-09-11 DIAGNOSIS — C250 Malignant neoplasm of head of pancreas: Secondary | ICD-10-CM | POA: Diagnosis not present

## 2021-09-11 DIAGNOSIS — T451X5A Adverse effect of antineoplastic and immunosuppressive drugs, initial encounter: Secondary | ICD-10-CM | POA: Diagnosis not present

## 2021-09-11 DIAGNOSIS — R197 Diarrhea, unspecified: Secondary | ICD-10-CM

## 2021-09-11 DIAGNOSIS — G4733 Obstructive sleep apnea (adult) (pediatric): Secondary | ICD-10-CM | POA: Diagnosis not present

## 2021-09-11 DIAGNOSIS — R5383 Other fatigue: Secondary | ICD-10-CM | POA: Diagnosis not present

## 2021-09-11 DIAGNOSIS — Z5111 Encounter for antineoplastic chemotherapy: Secondary | ICD-10-CM | POA: Diagnosis not present

## 2021-09-11 DIAGNOSIS — E876 Hypokalemia: Secondary | ICD-10-CM | POA: Diagnosis not present

## 2021-09-11 DIAGNOSIS — I959 Hypotension, unspecified: Secondary | ICD-10-CM | POA: Diagnosis not present

## 2021-09-11 DIAGNOSIS — R200 Anesthesia of skin: Secondary | ICD-10-CM | POA: Diagnosis not present

## 2021-09-11 DIAGNOSIS — K8689 Other specified diseases of pancreas: Secondary | ICD-10-CM | POA: Diagnosis not present

## 2021-09-11 DIAGNOSIS — Z95828 Presence of other vascular implants and grafts: Secondary | ICD-10-CM

## 2021-09-11 DIAGNOSIS — E119 Type 2 diabetes mellitus without complications: Secondary | ICD-10-CM | POA: Diagnosis not present

## 2021-09-11 DIAGNOSIS — I951 Orthostatic hypotension: Secondary | ICD-10-CM

## 2021-09-11 LAB — MAGNESIUM: Magnesium: 1.8 mg/dL (ref 1.7–2.4)

## 2021-09-11 LAB — CBC WITH DIFFERENTIAL/PLATELET
Basophils Absolute: 0 10*3/uL (ref 0.0–0.1)
Basophils Relative: 0 %
Eosinophils Absolute: 0.1 10*3/uL (ref 0.0–0.5)
Eosinophils Relative: 3 %
HCT: 29.1 % — ABNORMAL LOW (ref 39.0–52.0)
Hemoglobin: 9.5 g/dL — ABNORMAL LOW (ref 13.0–17.0)
Lymphocytes Relative: 29 %
Lymphs Abs: 1.2 10*3/uL (ref 0.7–4.0)
MCH: 31.4 pg (ref 26.0–34.0)
MCHC: 32.6 g/dL (ref 30.0–36.0)
MCV: 96 fL (ref 80.0–100.0)
Metamyelocytes Relative: 2 %
Monocytes Absolute: 0.8 10*3/uL (ref 0.1–1.0)
Monocytes Relative: 20 %
Myelocytes: 3 %
Neutro Abs: 1.7 10*3/uL (ref 1.7–7.7)
Neutrophils Relative %: 40 %
Platelets: 259 10*3/uL (ref 150–400)
Promyelocytes Relative: 3 %
RBC: 3.03 MIL/uL — ABNORMAL LOW (ref 4.22–5.81)
RDW: 19.9 % — ABNORMAL HIGH (ref 11.5–15.5)
WBC: 4.2 10*3/uL (ref 4.0–10.5)
nRBC: 0 % (ref 0.0–0.2)

## 2021-09-11 LAB — COMPREHENSIVE METABOLIC PANEL
ALT: 13 U/L (ref 0–44)
AST: 18 U/L (ref 15–41)
Albumin: 2.8 g/dL — ABNORMAL LOW (ref 3.5–5.0)
Alkaline Phosphatase: 120 U/L (ref 38–126)
Anion gap: 7 (ref 5–15)
BUN: 20 mg/dL (ref 8–23)
CO2: 22 mmol/L (ref 22–32)
Calcium: 8.1 mg/dL — ABNORMAL LOW (ref 8.9–10.3)
Chloride: 109 mmol/L (ref 98–111)
Creatinine, Ser: 1.02 mg/dL (ref 0.61–1.24)
GFR, Estimated: >60 mL/min (ref >60)
Glucose, Bld: 171 mg/dL — ABNORMAL HIGH (ref 70–99)
Potassium: 3.2 mmol/L — ABNORMAL LOW (ref 3.5–5.1)
Sodium: 138 mmol/L (ref 135–145)
Total Bilirubin: 0.4 mg/dL (ref 0.3–1.2)
Total Protein: 6.3 g/dL — ABNORMAL LOW (ref 6.5–8.1)

## 2021-09-11 MED ORDER — HEPARIN SOD (PORK) LOCK FLUSH 100 UNIT/ML IV SOLN
500.0000 [IU] | Freq: Once | INTRAVENOUS | Status: AC
  Administered 2021-09-11: 500 [IU] via INTRAVENOUS

## 2021-09-11 MED ORDER — POTASSIUM CHLORIDE IN NACL 40-0.9 MEQ/L-% IV SOLN
Freq: Once | INTRAVENOUS | Status: AC
  Filled 2021-09-11: qty 1000

## 2021-09-11 MED ORDER — SODIUM CHLORIDE 0.9% FLUSH
10.0000 mL | Freq: Once | INTRAVENOUS | Status: AC
  Administered 2021-09-11: 10 mL via INTRAVENOUS

## 2021-09-11 NOTE — Patient Instructions (Signed)
[facility]  Discharge Instructions: ?Thank you for choosing [facility] to provide your oncology and hematology care.  ?If you have a lab appointment with the [facility], please come in thru the Main Entrance and check in at the main information desk. ? ?Wear comfortable clothing and clothing appropriate for easy access to any Portacath or PICC line.  ? ?We strive to give you quality time with your provider. You may need to reschedule your appointment if you arrive late (15 or more minutes).  Arriving late affects you and other patients whose appointments are after yours.  Also, if you miss three or more appointments without notifying the office, you may be dismissed from the clinic at the provider?s discretion.    ?  ?For prescription refill requests, have your pharmacy contact our office and allow 72 hours for refills to be completed.   ? ?Today you received the following hydration fluids, return as scheduled. ?  ?To help prevent nausea and vomiting after your treatment, we encourage you to take your nausea medication as directed. ? ?BELOW ARE SYMPTOMS THAT SHOULD BE REPORTED IMMEDIATELY: ?*FEVER GREATER THAN 100.4 F (38 ?C) OR HIGHER ?*CHILLS OR SWEATING ?*NAUSEA AND VOMITING THAT IS NOT CONTROLLED WITH YOUR NAUSEA MEDICATION ?*UNUSUAL SHORTNESS OF BREATH ?*UNUSUAL BRUISING OR BLEEDING ?*URINARY PROBLEMS (pain or burning when urinating, or frequent urination) ?*BOWEL PROBLEMS (unusual diarrhea, constipation, pain near the anus) ?TENDERNESS IN MOUTH AND THROAT WITH OR WITHOUT PRESENCE OF ULCERS (sore throat, sores in mouth, or a toothache) ?UNUSUAL RASH, SWELLING OR PAIN  ?UNUSUAL VAGINAL DISCHARGE OR ITCHING  ? ?Items with * indicate a potential emergency and should be followed up as soon as possible or go to the Emergency Department if any problems should occur. ? ?Please show the CHEMOTHERAPY ALERT CARD or IMMUNOTHERAPY ALERT CARD at check-in to the Emergency Department and triage  nurse. ? ?Should you have questions after your visit or need to cancel or reschedule your appointment, please contact [facility] [phone]  and follow the prompts.  Office hours are 8:00 a.m. to 4:30 p.m. Monday - Friday. Please note that voicemails left after 4:00 p.m. may not be returned until the following business day.  We are closed weekends and major holidays. You have access to a nurse at all times for urgent questions. Please call the main number to the clinic [phone] and follow the prompts. ? ?For any non-urgent questions, you may also contact your provider using MyChart. We now offer e-Visits for anyone 49 and older to request care online for non-urgent symptoms. For details visit [email or web address]. ?  ?Also download the MyChart app! Go to the app store, search "MyChart", open the app, select [facility], and log in with your MyChart username and password. ? ?Due to Covid, a mask is required upon entering the hospital/clinic. If you do not have a mask, one will be given to you upon arrival. For doctor visits, patients may have 1 support person aged 49 or older with them. For treatment visits, patients cannot have anyone with them due to current Covid guidelines and our immunocompromised population.  ?

## 2021-09-11 NOTE — Progress Notes (Signed)
[facility] ?[address] ?[place], [place] [address] ?Phone: [phone] ?Fax: [phone] ? ?[facility]NA[facility] PROGRESS NOTE  ? ?Alexander Banks ?[id number] ?29-Dec-1955 ?66 y.o. ? ?Alexander Banks is managed by Dr. Delton Banks for pancreatic adenocarcinoma. ? ?Actively treated with chemotherapy/immunotherapy/hormonal therapy: YES ? ?Current therapy: Gemcitabine + capecitabine ? ?Last treated: 09/03/2021 ? ?Next scheduled appointment with provider: 09/17/2021 ? ?Subjective:  ?Chief Complaint: Weakness, diarrhea, hand-foot syndrome ? ?Alexander Banks is managed by Dr. Delton Banks for his pancreatic adenocarcinoma.  He is on GemCap treatment regimen.  Last received IV gemcitabine on 09/03/2021.  Xeloda (capecitabine) she has been held since 09/03/2021 due to hand-foot syndrome. ? ?His hand-foot symptoms have improved over the past week, after he was prescribed clobetasol cream and was told to stop his Xeloda.  He still has some erythema of bilateral feet and hands, but blisters have resolved.  He has some skin peeling of his hands where he previously had blisters.  Pain has improved. ? ?He has had diarrhea for the past 2 weeks, that started within 5 days of his second cycle of gemcitabine.  He reports that he was taking 8 tablets of Lomotil daily but without relief.  He added Imodium to the Lomotil, and reports that his symptoms have started to ease up.  Reports that he had 1 solid bowel movement yesterday and 1 solid bowel movement today.  Prior to that, he was having 3-5 fully liquid stools daily.  He was having some intermittent abdominal pain that was relieved by passing gas.  He denied any fever, chills, nausea, or vomiting. ? ?He drinks 48 to 72 ounces of water daily.  However, he reports that he has no appetite, early satiety, and significant taste changes.  He has extremely poor oral intake of solid food.  He is trying to drink 1-2 Ensure beverages daily.  His weight is down 5  pounds in the past week. ? ?He had an episode of syncope on Saturday, 09/08/2021 when he stood up from a seated position.  He continues to have some lightheadedness with standing and generalized weakness and fatigue. ? ?He denies any current rash or mouth sores. ? ? ?Review of Systems:  ?Review of Systems  ?Constitutional:  Positive for activity change, appetite change and unexpected weight change. Negative for chills, diaphoresis, fatigue and fever.  ?HENT:  Negative for mouth sores, nosebleeds, sore throat and trouble swallowing.   ?Respiratory:  Negative for cough and shortness of breath.   ?Cardiovascular:  Negative for chest pain, palpitations and leg swelling.  ?Gastrointestinal:  Positive for abdominal pain, diarrhea and nausea. Negative for blood in stool, constipation and vomiting.  ?Genitourinary:  Negative for dysuria and hematuria.  ?Skin:  Positive for color change and rash.  ?Neurological:  Positive for syncope and light-headedness. Negative for dizziness, numbness and headaches.  ?Psychiatric/Behavioral:  Negative for dysphoric mood and sleep disturbance. The patient is not nervous/anxious.   ? ? ?Past Medical History, Surgical history, Social history, and Family history were reviewed as documented elsewhere in chart, and were updated as appropriate.  ? ?Assessment & Plan:    ?1.  Pancreatic adenocarcinoma ([id number]) ?-Diagnosed in July 2022 ?- Received 5 cycles of neoadjuvant FOLFIRINOX from 01/10/2021 through 03/14/2021 ?- Whipple's procedure with I[place] on 05/15/2021 ?- Adjuvant chemotherapy for 4 months with gemcitabine + capecitabine started on 07/23/2021, with plans to follow with chemoradiation therapy and Xeloda for positive margins ?- Last gemcitabine was given on 09/03/2021. ?- Capecitabine (Xeloda)  was held as of 09/03/2021 due to hand-foot syndrome ?- PLAN: Patient is scheduled for next visit with Dr. Delton Banks on 09/17/2021. ? ?2.  Hand-foot syndrome ?- Capecitabine (low-dose) has been held since  09/03/2021 due to complaints of rash, skin cracking, and pain of bilateral feet and hands. ?- Prescription for clobetasol cream was sent to pharmacy on 09/03/2021 ?- Symptoms today are improved. ?- PLAN: Continue clobetasol cream.  Do not restart Xeloda again at this time. ? ?3.  Diarrhea ?- Severe diarrhea for the past 2 weeks ?- Prescription for Lomotil was sent to pharmacy on 09/03/2021 ?-Patient required maximum dose of Lomotil as well as Imodium before his diarrhea started to improve. ?- PLAN: We will check C. difficile and stool panel. ?- Otherwise, continue Lomotil and Imodium as needed. ? ?4.  Dehydration  ?- Secondary to diarrhea  ?- Received IV fluids on 09/03/2021 ?- Vital signs today significant for marginal blood pressure 98/55 and tachycardia 94. ?- Labs today (09/11/2021): Creatinine mildly elevated above baseline at 1.02.  Hypokalemia with potassium 3.2.  Normal magnesium.  CBC at baseline with hemoglobin 9.5. ?- PLAN: We will give IV fluids (1 L NS) + 40 mEq potassium ? ?5.  Syncope and hypotension ?- Likely secondary to dehydration as above ?- PLAN: IV fluids today.  RTC in 1 week.  Patient will call if he has any recurrent symptoms ? ?6.  Weight loss ?- Weight today is 172 pounds, down 5 pounds from last week ?- Ongoing weight loss since diagnosis, increased weight loss following with procedure ?-Secondary to diarrhea, poor appetite, early satiety, loss of taste ?- PLAN: He is being followed by registered dietitian at cancer center.  Next appointment on 09/17/2021. ?- Continue Creon.  Continue high-calorie/high-protein foods.  Continue small frequent meals and snacks.  Continue Ensure supplements. ? ? ?Objective:  ? ?Physical Exam:  ?There were no vitals taken for this visit. ?ECOG: 2 ? ?Physical Exam ?Constitutional:   ?   Appearance: Normal appearance.  ?HENT:  ?   Head: Normocephalic and atraumatic.  ?   Mouth/Throat:  ?   Mouth: Mucous membranes are moist.  ?Eyes:  ?   Extraocular Movements:  Extraocular movements intact.  ?   Pupils: Pupils are equal, round, and reactive to light.  ?Cardiovascular:  ?   Rate and Rhythm: Regular rhythm. Tachycardia present.  ?   Pulses: Normal pulses.  ?   Heart sounds: Normal heart sounds.  ?Pulmonary:  ?   Effort: Pulmonary effort is normal.  ?   Breath sounds: Normal breath sounds.  ?Abdominal:  ?   General: Bowel sounds are normal.  ?   Palpations: Abdomen is soft.  ?   Tenderness: There is no abdominal tenderness.  ?Musculoskeletal:     ?   General: No swelling.  ?   Right lower leg: No edema.  ?   Left lower leg: No edema.  ?Lymphadenopathy:  ?   Cervical: No cervical adenopathy.  ?Skin: ?   General: Skin is warm and dry.  ?   Comments: Erythema of bilateral feet overlying heels, lateral edge of foot, and ball of foot.  Blisters have resolved.  Mild skin peeling noted. ?Erythema of bilateral hands.  Blisters have resolved.  There is some skin peeling over areas of previous blisters.  ?Neurological:  ?   General: No focal deficit present.  ?   Mental Status: He is alert and oriented to person, place, and time.  ?Psychiatric:     ?  Mood and Affect: Mood normal.     ?   Behavior: Behavior normal.  ? ? ?Lab Review:  ?   ?Component Value Date/Time  ? NA 138 09/11/2021 1025  ? NA 140 04/30/2021 1655  ? K 3.2 (L) 09/11/2021 1025  ? CL 109 09/11/2021 1025  ? CO2 22 09/11/2021 1025  ? GLUCOSE 171 (H) 09/11/2021 1025  ? BUN 20 09/11/2021 1025  ? BUN 12 04/30/2021 1655  ? CREATININE 1.02 09/11/2021 1025  ? CREATININE 1.10 05/31/2013 1020  ? CALCIUM 8.1 (L) 09/11/2021 1025  ? PROT 6.3 (L) 09/11/2021 1025  ? PROT 6.3 07/19/2021 0934  ? ALBUMIN 2.8 (L) 09/11/2021 1025  ? ALBUMIN 3.2 (L) 07/19/2021 0934  ? AST 18 09/11/2021 1025  ? ALT 13 09/11/2021 1025  ? ALKPHOS 120 09/11/2021 1025  ? BILITOT 0.4 09/11/2021 1025  ? BILITOT 0.4 07/19/2021 0934  ? GFRNONAA >60 09/11/2021 1025  ? GFRAA 87 06/02/2020 0812  ? ? ?   ?Component Value Date/Time  ? WBC 4.2 09/11/2021 1025  ? RBC 3.03  (L) 09/11/2021 1025  ? HGB 9.5 (L) 09/11/2021 1025  ? HGB 13.6 04/30/2021 1655  ? HCT 29.1 (L) 09/11/2021 1025  ? HCT 41.0 04/30/2021 1655  ? PLT 259 09/11/2021 1025  ? PLT 190 04/30/2021 1655  ? MCV 96.0 04/2

## 2021-09-11 NOTE — Patient Instructions (Signed)
[facility] at [facility] ?Discharge Instructions ? ?You were seen today by Tarri Abernethy PA-C for your symptom management visit.   ? ?HAND/FOOT REACTION: ?Continue clobetasol cream. ?Do NOT restart your Xeloda at this time. ?Wear thick cotton gloves and socks, and shoes with padded insoles, as needed to decrease friction and trauma to your hands and feet. ?Avoid hot water when bathing or dishwashing. ? ?DIARRHEA: ?We will check a stool sample to make sure you do not have any infection in your intestine.  We will send you home with stool collection kit.  Next time you have soft or loose bowel movement, please collect sample and bring back to the cancer center. ?Take Lomotil and Imodium as needed for diarrhea.  Do not take these medications if you are not having active diarrhea. ? ?DEHYDRATION: ?Drink plenty of water throughout the day, especially while you are having diarrhea. ?If you feel extreme weakness or lightheadedness with standing, this can be a sign of worsening dehydration.  If you experience any symptoms, please call the clinic so that we can schedule you for IV fluids as needed. ? ?WEIGHT LOSS: ?Continue taking your pancreatic enzyme supplement (Creon). ?Continue to try to eat small frequent meals and snacks throughout the day, especially with high-calorie and high-protein foods. ?Continue taking Ensure supplements 2+ times daily. ?You are scheduled to see the dietitian at your follow-up visit next week. ? ?FOLLOW-UP APPOINTMENT: You are scheduled for labs and follow-up visit with [name] on 09/17/2021.  Make sure that you ask him your questions about chemotherapy treatment, side effects, and treatment response at that time. ? ? ?Thank you for choosing [facility] at [facility] to provide your oncology and hematology care.  To afford each patient quality time with our provider, please arrive at least 15 minutes before your scheduled appointment  time.  ? ?If you have a lab appointment with the [facility] please come in thru the Main Entrance and check in at the main information desk. ? ?You need to re-schedule your appointment should you arrive 10 or more minutes late.  We strive to give you quality time with our providers, and arriving late affects you and other patients whose appointments are after yours.  Also, if you no show three or more times for appointments you may be dismissed from the clinic at the providers discretion.     ?Again, thank you for choosing [facility].  Our hope is that these requests will decrease the amount of time that you wait before being seen by our physicians.       ?_____________________________________________________________ ? ?Should you have questions after your visit to [facility], please contact our office at [phone] and follow the prompts.  Our office hours are 8:00 a.m. and 4:30 p.m. Monday - Friday.  Please note that voicemails left after 4:00 p.m. may not be returned until the following business day.  We are closed weekends and major holidays.  You do have access to a nurse 24-7, just call the main number to the clinic [phone] and do not press any options, hold on the line and a nurse will answer the phone.   ? ?For prescription refill requests, have your pharmacy contact our office and allow 72 hours.   ? ?Due to Covid, you will need to wear a mask upon entering the hospital. If you do not have a mask, a mask will be given to you at  the Main Entrance upon arrival. For doctor visits, patients may have 1 support person age 67 or older with them. For treatment visits, patients can not have anyone with them due to social distancing guidelines and our immunocompromised population.  ? ? ? ?

## 2021-09-11 NOTE — Progress Notes (Signed)
Patients port flushed without difficulty.  Good blood return noted with no bruising or swelling noted at site.  Stable during access and blood draw.  Patient to remain accessed for treatment. 

## 2021-09-11 NOTE — Progress Notes (Signed)
Patient presents today for symptom management visit with Tarri Abernethy PA, received orders for 0.9% NaCl with KCL 40 mEq/L. Patient tolerated hydration therapy with no complaints voiced. Side effects with management reviewed with understanding verbalized. Port site clean and dry with no bruising or swelling noted at site. Good blood return noted before and after administration of therapy. Band aid applied. Patient left in satisfactory condition with VSS and no s/s of distress noted.  ?

## 2021-09-12 ENCOUNTER — Other Ambulatory Visit (HOSPITAL_COMMUNITY): Payer: Self-pay

## 2021-09-12 ENCOUNTER — Telehealth: Payer: Self-pay | Admitting: *Deleted

## 2021-09-12 DIAGNOSIS — R197 Diarrhea, unspecified: Secondary | ICD-10-CM

## 2021-09-12 DIAGNOSIS — Z5111 Encounter for antineoplastic chemotherapy: Secondary | ICD-10-CM | POA: Diagnosis not present

## 2021-09-12 DIAGNOSIS — C250 Malignant neoplasm of head of pancreas: Secondary | ICD-10-CM | POA: Diagnosis not present

## 2021-09-12 DIAGNOSIS — K8689 Other specified diseases of pancreas: Secondary | ICD-10-CM | POA: Diagnosis not present

## 2021-09-12 DIAGNOSIS — E119 Type 2 diabetes mellitus without complications: Secondary | ICD-10-CM | POA: Diagnosis not present

## 2021-09-12 DIAGNOSIS — R42 Dizziness and giddiness: Secondary | ICD-10-CM | POA: Diagnosis not present

## 2021-09-12 DIAGNOSIS — E876 Hypokalemia: Secondary | ICD-10-CM | POA: Diagnosis not present

## 2021-09-12 DIAGNOSIS — I959 Hypotension, unspecified: Secondary | ICD-10-CM | POA: Diagnosis not present

## 2021-09-12 DIAGNOSIS — L271 Localized skin eruption due to drugs and medicaments taken internally: Secondary | ICD-10-CM | POA: Diagnosis not present

## 2021-09-12 DIAGNOSIS — E785 Hyperlipidemia, unspecified: Secondary | ICD-10-CM | POA: Diagnosis not present

## 2021-09-12 DIAGNOSIS — T451X5A Adverse effect of antineoplastic and immunosuppressive drugs, initial encounter: Secondary | ICD-10-CM | POA: Diagnosis not present

## 2021-09-12 DIAGNOSIS — G4733 Obstructive sleep apnea (adult) (pediatric): Secondary | ICD-10-CM | POA: Diagnosis not present

## 2021-09-12 DIAGNOSIS — R200 Anesthesia of skin: Secondary | ICD-10-CM | POA: Diagnosis not present

## 2021-09-12 DIAGNOSIS — R5383 Other fatigue: Secondary | ICD-10-CM | POA: Diagnosis not present

## 2021-09-12 DIAGNOSIS — R634 Abnormal weight loss: Secondary | ICD-10-CM | POA: Diagnosis not present

## 2021-09-12 LAB — C DIFFICILE QUICK SCREEN W PCR REFLEX
C Diff antigen: NEGATIVE
C Diff interpretation: NOT DETECTED
C Diff toxin: NEGATIVE

## 2021-09-12 NOTE — Telephone Encounter (Signed)
Prior Authorization for Sildenafil 100 mg approved by insurance- 09/11/2021-09/11/2022. ?Patient notified. ?

## 2021-09-14 ENCOUNTER — Other Ambulatory Visit (HOSPITAL_COMMUNITY): Payer: Self-pay

## 2021-09-16 LAB — GI PATHOGEN PANEL BY PCR, STOOL
Adenovirus F 40/41: NOT DETECTED
Astrovirus: NOT DETECTED
Campylobacter by PCR: NOT DETECTED
Cryptosporidium by PCR: NOT DETECTED
Cyclospora cayetanensis: NOT DETECTED
E coli (ETEC) LT/ST: NOT DETECTED
E coli (STEC): NOT DETECTED
Entamoeba histolytica: NOT DETECTED
Enteroaggregative E coli: NOT DETECTED
Enteropathogenic E coli: NOT DETECTED
G lamblia by PCR: NOT DETECTED
Norovirus GI/GII: NOT DETECTED
Plesiomonas shigelloides: NOT DETECTED
Rotavirus A by PCR: NOT DETECTED
Salmonella by PCR: NOT DETECTED
Sapovirus: NOT DETECTED
Shigella by PCR: NOT DETECTED
Vibrio cholerae: NOT DETECTED
Vibrio: NOT DETECTED
Yersinia enterocolitica: DETECTED — AB

## 2021-09-17 ENCOUNTER — Inpatient Hospital Stay (HOSPITAL_COMMUNITY): Payer: BC Managed Care – PPO | Admitting: Dietician

## 2021-09-17 ENCOUNTER — Inpatient Hospital Stay (HOSPITAL_COMMUNITY): Payer: BC Managed Care – PPO | Attending: Hematology

## 2021-09-17 ENCOUNTER — Inpatient Hospital Stay (HOSPITAL_COMMUNITY): Payer: BC Managed Care – PPO

## 2021-09-17 ENCOUNTER — Inpatient Hospital Stay (HOSPITAL_BASED_OUTPATIENT_CLINIC_OR_DEPARTMENT_OTHER): Payer: BC Managed Care – PPO | Admitting: Hematology

## 2021-09-17 VITALS — BP 108/71 | HR 80 | Temp 98.0°F | Resp 18

## 2021-09-17 DIAGNOSIS — C259 Malignant neoplasm of pancreas, unspecified: Secondary | ICD-10-CM

## 2021-09-17 DIAGNOSIS — Z79899 Other long term (current) drug therapy: Secondary | ICD-10-CM | POA: Insufficient documentation

## 2021-09-17 DIAGNOSIS — Z5111 Encounter for antineoplastic chemotherapy: Secondary | ICD-10-CM | POA: Diagnosis not present

## 2021-09-17 DIAGNOSIS — Z8249 Family history of ischemic heart disease and other diseases of the circulatory system: Secondary | ICD-10-CM | POA: Insufficient documentation

## 2021-09-17 DIAGNOSIS — M25472 Effusion, left ankle: Secondary | ICD-10-CM | POA: Diagnosis not present

## 2021-09-17 DIAGNOSIS — E119 Type 2 diabetes mellitus without complications: Secondary | ICD-10-CM | POA: Insufficient documentation

## 2021-09-17 DIAGNOSIS — K8689 Other specified diseases of pancreas: Secondary | ICD-10-CM | POA: Insufficient documentation

## 2021-09-17 DIAGNOSIS — R748 Abnormal levels of other serum enzymes: Secondary | ICD-10-CM | POA: Insufficient documentation

## 2021-09-17 DIAGNOSIS — F17210 Nicotine dependence, cigarettes, uncomplicated: Secondary | ICD-10-CM | POA: Diagnosis not present

## 2021-09-17 DIAGNOSIS — E785 Hyperlipidemia, unspecified: Secondary | ICD-10-CM | POA: Diagnosis not present

## 2021-09-17 DIAGNOSIS — C250 Malignant neoplasm of head of pancreas: Secondary | ICD-10-CM | POA: Diagnosis not present

## 2021-09-17 DIAGNOSIS — Z806 Family history of leukemia: Secondary | ICD-10-CM | POA: Insufficient documentation

## 2021-09-17 DIAGNOSIS — I10 Essential (primary) hypertension: Secondary | ICD-10-CM | POA: Diagnosis not present

## 2021-09-17 DIAGNOSIS — Z95828 Presence of other vascular implants and grafts: Secondary | ICD-10-CM

## 2021-09-17 DIAGNOSIS — R634 Abnormal weight loss: Secondary | ICD-10-CM | POA: Insufficient documentation

## 2021-09-17 DIAGNOSIS — R200 Anesthesia of skin: Secondary | ICD-10-CM | POA: Insufficient documentation

## 2021-09-17 LAB — CBC WITH DIFFERENTIAL/PLATELET
Abs Immature Granulocytes: 0.16 10*3/uL — ABNORMAL HIGH (ref 0.00–0.07)
Basophils Absolute: 0.1 10*3/uL (ref 0.0–0.1)
Basophils Relative: 1 %
Eosinophils Absolute: 0.1 10*3/uL (ref 0.0–0.5)
Eosinophils Relative: 2 %
HCT: 30.2 % — ABNORMAL LOW (ref 39.0–52.0)
Hemoglobin: 9.8 g/dL — ABNORMAL LOW (ref 13.0–17.0)
Immature Granulocytes: 3 %
Lymphocytes Relative: 25 %
Lymphs Abs: 1.6 10*3/uL (ref 0.7–4.0)
MCH: 32.2 pg (ref 26.0–34.0)
MCHC: 32.5 g/dL (ref 30.0–36.0)
MCV: 99.3 fL (ref 80.0–100.0)
Monocytes Absolute: 0.8 10*3/uL (ref 0.1–1.0)
Monocytes Relative: 13 %
Neutro Abs: 3.6 10*3/uL (ref 1.7–7.7)
Neutrophils Relative %: 56 %
Platelets: 332 10*3/uL (ref 150–400)
RBC: 3.04 MIL/uL — ABNORMAL LOW (ref 4.22–5.81)
RDW: 22.6 % — ABNORMAL HIGH (ref 11.5–15.5)
WBC: 6.3 10*3/uL (ref 4.0–10.5)
nRBC: 0 % (ref 0.0–0.2)

## 2021-09-17 LAB — COMPREHENSIVE METABOLIC PANEL WITH GFR
ALT: 34 U/L (ref 0–44)
AST: 49 U/L — ABNORMAL HIGH (ref 15–41)
Albumin: 2.4 g/dL — ABNORMAL LOW (ref 3.5–5.0)
Alkaline Phosphatase: 136 U/L — ABNORMAL HIGH (ref 38–126)
Anion gap: 5 (ref 5–15)
BUN: 14 mg/dL (ref 8–23)
CO2: 25 mmol/L (ref 22–32)
Calcium: 7.7 mg/dL — ABNORMAL LOW (ref 8.9–10.3)
Chloride: 110 mmol/L (ref 98–111)
Creatinine, Ser: 0.84 mg/dL (ref 0.61–1.24)
GFR, Estimated: >60 mL/min
Glucose, Bld: 141 mg/dL — ABNORMAL HIGH (ref 70–99)
Potassium: 3.9 mmol/L (ref 3.5–5.1)
Sodium: 140 mmol/L (ref 135–145)
Total Bilirubin: 0.5 mg/dL (ref 0.3–1.2)
Total Protein: 5.6 g/dL — ABNORMAL LOW (ref 6.5–8.1)

## 2021-09-17 LAB — MAGNESIUM: Magnesium: 2 mg/dL (ref 1.7–2.4)

## 2021-09-17 MED ORDER — SODIUM CHLORIDE 0.9 % IV SOLN
8.0000 mg | Freq: Once | INTRAVENOUS | Status: AC
  Administered 2021-09-17: 8 mg via INTRAVENOUS
  Filled 2021-09-17: qty 4

## 2021-09-17 MED ORDER — HEPARIN SOD (PORK) LOCK FLUSH 100 UNIT/ML IV SOLN
500.0000 [IU] | Freq: Once | INTRAVENOUS | Status: AC | PRN
  Administered 2021-09-17: 500 [IU]

## 2021-09-17 MED ORDER — SODIUM CHLORIDE 0.9% FLUSH
10.0000 mL | INTRAVENOUS | Status: DC | PRN
  Administered 2021-09-17: 10 mL

## 2021-09-17 MED ORDER — CIPROFLOXACIN HCL 500 MG PO TABS: 500.0000 mg | ORAL_TABLET | Freq: Two times a day (BID) | ORAL | 0 refills | Status: DC

## 2021-09-17 MED ORDER — SODIUM CHLORIDE 0.9 % IV SOLN: Freq: Once | INTRAVENOUS | Status: AC

## 2021-09-17 MED ORDER — CLOBETASOL PROPIONATE 0.05 % EX CREA
1.0000 | TOPICAL_CREAM | Freq: Two times a day (BID) | CUTANEOUS | 3 refills | Status: DC
Start: 2021-09-17 — End: 2022-02-06

## 2021-09-17 MED ORDER — SODIUM CHLORIDE 0.9 % IV SOLN
750.0000 mg/m2 | Freq: Once | INTRAVENOUS | Status: AC
  Administered 2021-09-17: 1558 mg via INTRAVENOUS
  Filled 2021-09-17: qty 40.98

## 2021-09-17 NOTE — Progress Notes (Signed)
Treatment given today per MD orders. Tolerated infusion without adverse affects. Vital signs stable. No complaints at this time. Discharged from clinic ambulatory in stable condition. Alert and oriented x 3. F/U with [facility] as scheduled.   

## 2021-09-17 NOTE — Progress Notes (Signed)
Nutrition Follow-up: ? ?Patient completed neoadjuvant FOLFIRINOX for pancreatic cancer. He is s/p Whipple at [facility] with Dr. Basilia Jumbo on 12/27. Patient is currently receiving adjuvant gemcitabine + xeloda.  ?-Xeloda held since 4/17 due to hand-foot syndrome. Patient will be restarting Xeloda at half dose. ? ?Met with patient and wife during infusion. He reports diarrhea has resolved and has felt pretty good this past week. Patient hands continue to peel, however this is improving. He reports ongoing altered taste with some foods. Wife reports breakfast continues to be a struggle. He has never been one to eat breakfast. Sometimes he will have a protein bar, occasionally will want a bacon/egg biscuit. Patient recalls going to [facility] for lunch yesterday (shrimp/lobster bisque, fire cracker chicken wraps, and salad). Wife reports he ate really well. Patient had peanut butter/pear preserve sandwich for dinner. Patient is drinking ~48 ounces of water, milk, and tea. Wife has been sneaking half of Ensure into glass of chocolate milk that he drinks daily.  ? ? ?Medications: reviewed ? ?Labs: glucose 141 ? ? ?Anthropometrics: Weight 182 lb 12.2 oz today increased (s/p IVF on 4/25) ? ?4/25 - 172 lb  ?4/17 - 177 lb 3.2 oz  ?3/20 - 181 lb 2 oz ?2/16 - 180 lb 5.4 oz ? ?NUTRITION DIAGNOSIS: Unintentional weight loss improved ? ? ? ?INTERVENTION:  ?Encouraged high calorie high protein foods ?Encouraged one Ensure Plus/equivalent in the morning and at bedtime - coupons provided  ?Reviewed strategies for diarrhea and importance of hydration ?Continue taking Creon ? ? ?MONITORING, EVALUATION, GOAL: weight trends, intake  ? ? ?NEXT VISIT: f/u via telephone ~4 weeks ? ? ? ?

## 2021-09-17 NOTE — Patient Instructions (Addendum)
[facility] [place] at [facility] ?Discharge Instructions ? ? ?You were seen and examined today by [name]. ? ?He reviewed the results of your lab work which is normal/stable.  ? ?We will proceed with your treatment today. Cut back on the Xeloda to 2 pills twice a day.  ? ?Return as scheduled.  ? ? ? ? ?Thank you for choosing [facility] at [facility] to provide your oncology and hematology care.  To afford each patient quality time with our provider, please arrive at least 15 minutes before your scheduled appointment time.  ? ?If you have a lab appointment with the [facility] please come in thru the Main Entrance and check in at the main information desk. ? ?You need to re-schedule your appointment should you arrive 10 or more minutes late.  We strive to give you quality time with our providers, and arriving late affects you and other patients whose appointments are after yours.  Also, if you no show three or more times for appointments you may be dismissed from the clinic at the providers discretion.     ?Again, thank you for choosing [facility].  Our hope is that these requests will decrease the amount of time that you wait before being seen by our physicians.       ?_____________________________________________________________ ? ?Should you have questions after your visit to [facility], please contact our office at [phone] and follow the prompts.  Our office hours are 8:00 a.m. and 4:30 p.m. Monday - Friday.  Please note that voicemails left after 4:00 p.m. may not be returned until the following business day.  We are closed weekends and major holidays.  You do have access to a nurse 24-7, just call the main number to the clinic [phone] and do not press any options, hold on the line and a nurse will answer the phone.   ? ?For prescription refill requests, have your pharmacy contact our office and allow 72 hours.   ? ?Due to  Covid, you will need to wear a mask upon entering the hospital. If you do not have a mask, a mask will be given to you at the Main Entrance upon arrival. For doctor visits, patients may have 1 support person age 3 or older with them. For treatment visits, patients can not have anyone with them due to social distancing guidelines and our immunocompromised population.  ? ?   ?

## 2021-09-17 NOTE — Progress Notes (Signed)
? ?[facility] ?[address] ?[place], [place] [address] ? ? ?CLINIC:  ?Medical Oncology/Hematology ? ?PCP:  ?[name], MD ?[name] / [place] [address] ?[phone] ? ? ?REASON FOR VISIT:  ?Follow-up for pancreatic adenocarcinoma ? ?PRIOR THERAPY: FOLFIRINOX every 2 weeks x 4 cycles ? ?NGS Results: not done ? ?CURRENT THERAPY: Gemcitabine D1,8,15 + Capecitabine D1-21 q28d x 6 cycles ? ?BRIEF ONCOLOGIC HISTORY:  ?Oncology History  ?Pancreatic adenocarcinoma ([place])  ?12/04/2020 Initial Diagnosis  ? Pancreatic adenocarcinoma ([place]) ? ?  ?01/10/2021 - 03/16/2021 Chemotherapy  ? Patient is on Treatment Plan : PANCREAS Modified FOLFIRINOX q14d x 4 cycles  ? ?  ?  ? Genetic Testing  ? [facility] CancerNext-Expanded Panel+RNA was Negative. Of note, a variant of uncertain significance was detected in the BRCA1 gene. Report date is 04/23/2021. ? ?The CancerNext-Expanded gene panel offered by [facility] and includes sequencing, rearrangement, and RNA analysis for the following 77 genes: AIP, ALK, APC, ATM, AXIN2, BAP1, BARD1, BLM, BMPR1A, BRCA1, BRCA2, BRIP1, CDC73, CDH1, CDK4, CDKN1B, CDKN2A, CHEK2, CTNNA1, DICER1, FANCC, FH, FLCN, GALNT12, KIF1B, LZTR1, MAX, MEN1, MET, MLH1, MSH2, MSH3, MSH6, MUTYH, NBN, NF1, NF2, NTHL1, PALB2, PHOX2B, PMS2, POT1, PRKAR1A, PTCH1, PTEN, RAD51C, RAD51D, RB1, RECQL, RET, SDHA, SDHAF2, SDHB, SDHC, SDHD, SMAD4, SMARCA4, SMARCB1, SMARCE1, STK11, SUFU, TMEM127, TP53, TSC1, TSC2, VHL and XRCC2 (sequencing and deletion/duplication); EGFR, EGLN1, HOXB13, KIT, MITF, PDGFRA, POLD1, and POLE (sequencing only); EPCAM and GREM1 (deletion/duplication only).  ?  ?07/23/2021 -  Chemotherapy  ? Patient is on Treatment Plan : PANCREAS Gemcitabine D1,8,15 + Capecitabine D1-21 q28d x 6 cycles  ? ?  ?  ? ? ?CANCER STAGING: ? Cancer Staging  ?Pancreatic adenocarcinoma ([place]) ?Staging form: Exocrine Pancreas, AJCC 8th Edition ?- Clinical stage from 12/04/2020: Stage IB (cT2, cN0, cM0) -  Unsigned ? ? ?INTERVAL HISTORY:  ?Mr. [name], a 66 y.o. male, returns for routine follow-up and consideration for next cycle of chemotherapy. Clem was last seen on 08/20/2021. ? ?Due for cycle #3 of Gemcitabine today.  ? ?Overall, he tells me he has been feeling pretty well, and he is accompanied by his wife. He reports his diarrhea has resolved after having lasted for 2 weeks. He reports skin reddening and peeling on his hands as well as red spots on his arm; he reports the skin on his feet has improved and has not blistered. He reports his left ankle is swollen. He has gained 10 lbs since his last visit. He denies nausea and vomiting. He reports coldness and tingling in his feet. He reports increased belching.  ? ?Overall, he feels ready for next cycle of chemo today.  ? ? ?REVIEW OF SYSTEMS:  ?Review of Systems  ?Constitutional:  Negative for appetite change, fatigue and unexpected weight change.  ?Cardiovascular:  Positive for leg swelling (L).  ?Skin:   ?     Skin peeling - hands  ?Neurological:  Positive for numbness (feet).  ?All other systems reviewed and are negative. ? ?PAST MEDICAL/SURGICAL HISTORY:  ?Past Medical History:  ?Diagnosis Date  ? Anemia, iron deficiency   ? Diabetes mellitus without complication ([place])   ? on meds  ? Hearing loss   ? Bil hearing aids  ? Hyperlipidemia   ? Hypertension   ? Port-A-Cath in place 12/26/2020  ? Prediabetes   ? Sleep apnea   ? ?Past Surgical History:  ?Procedure Laterality Date  ? BILIARY STENT PLACEMENT N/A 11/09/2020  ? Procedure:  BILIARY STENT PLACEMENT - 74 F X 5 CM STENT;  Surgeon: Rogene Houston, MD;  Location: [facility];  Service: Endoscopy;  Laterality: N/A;  ? BILIARY STENT PLACEMENT N/A 12/27/2020  ? Procedure: BILIARY STENT PLACEMENT;  Surgeon: Rogene Houston, MD;  Location: [facility];  Service: Endoscopy;  Laterality: N/A;  ? COLONOSCOPY    ? ERCP N/A 11/09/2020  ? Procedure: ENDOSCOPIC RETROGRADE CHOLANGIOPANCREATOGRAPHY (ERCP);  Surgeon:  Rogene Houston, MD;  Location: [facility];  Service: Endoscopy;  Laterality: N/A;  ? ERCP N/A 12/27/2020  ? Procedure: ENDOSCOPIC RETROGRADE CHOLANGIOPANCREATOGRAPHY (ERCP);  Surgeon: Rogene Houston, MD;  Location: [facility];  Service: Endoscopy;  Laterality: N/A;  ? ESOPHAGOGASTRODUODENOSCOPY (EGD) WITH PROPOFOL N/A 11/30/2020  ? Procedure: ESOPHAGOGASTRODUODENOSCOPY (EGD) WITH PROPOFOL;  Surgeon: Milus Banister, MD;  Location: [facility];  Service: Endoscopy;  Laterality: N/A;  ? EUS N/A 11/30/2020  ? Procedure: UPPER ENDOSCOPIC ULTRASOUND (EUS) RADIAL;  Surgeon: Milus Banister, MD;  Location: [facility];  Service: Endoscopy;  Laterality: N/A;  ? FINE NEEDLE ASPIRATION N/A 11/30/2020  ? Procedure: FINE NEEDLE ASPIRATION (FNA) LINEAR;  Surgeon: Milus Banister, MD;  Location: [facility];  Service: Endoscopy;  Laterality: N/A;  ? GIVENS CAPSULE STUDY    ? IR IMAGING GUIDED PORT INSERTION  12/18/2020  ? KNEE SURGERY    ? 4 surgeries on left, 2 on right knee  ? SHOULDER SURGERY    ? right shoulder  ? SPHINCTEROTOMY N/A 11/09/2020  ? Procedure: SHORT BILIARY SPHINCTEROTOMY;  Surgeon: Rogene Houston, MD;  Location: [facility];  Service: Endoscopy;  Laterality: N/A;  ? WHIPPLE PROCEDURE    ? ? ?SOCIAL HISTORY:  ?Social History  ? ?Socioeconomic History  ? Marital status: Married  ?  Spouse name: Not on file  ? Number of children: Not on file  ? Years of education: Not on file  ? Highest education level: Not on file  ?Occupational History  ? Not on file  ?Tobacco Use  ? Smoking status: Every Day  ?  Types: Cigars  ? Smokeless tobacco: Never  ? Tobacco comments:  ?  Smoke 3-4 cigars a day  ?Vaping Use  ? Vaping Use: Never used  ?Substance and Sexual Activity  ? Alcohol use: Yes  ?  Comment: occasional  ? Drug use: Never  ? Sexual activity: Not on file  ?Other Topics Concern  ? Not on file  ?Social History Narrative  ? Not on file  ? ?Social Determinants of Health  ? ?Financial Resource Strain: Low Risk   ? Difficulty  of Paying Living Expenses: Not hard at all  ?Food Insecurity: No Food Insecurity  ? Worried About Charity fundraiser in the Last Year: Never true  ? Ran Out of Food in the Last Year: Never true  ?Transportation Needs: No Transportation Needs  ? Lack of Transportation (Medical): No  ? Lack of Transportation (Non-Medical): No  ?Physical Activity: Sufficiently Active  ? Days of Exercise per Week: 5 days  ? Minutes of Exercise per Session: 30 min  ?Stress: Not on file  ?Social Connections: Not on file  ?Intimate Partner Violence: Not At Risk  ? Fear of Current or Ex-Partner: No  ? Emotionally Abused: No  ? Physically Abused: No  ? Sexually Abused: No  ? ? ?FAMILY HISTORY:  ?Family History  ?Problem Relation Age of Onset  ? Heart attack Father   ? Heart attack Paternal Grandfather   ? Leukemia Sister   ?  Heart attack Brother   ? ? ?CURRENT MEDICATIONS:  ?Current Outpatient Medications  ?Medication Sig Dispense Refill  ? capecitabine (XELODA) 500 MG tablet Take 3 tablets (1,500 mg total) by mouth 2 (two) times daily after a meal. Take for 21 days on, followed by 7 days off. Repeat every 28 days. 126 tablet 0  ? clobetasol cream (TEMOVATE) 5.17 % Apply 1 application. topically 2 (two) times daily. 30 g 3  ? Continuous Blood Gluc Receiver (FREESTYLE LIBRE 2 READER) DEVI As directed 1 each 0  ? Continuous Blood Gluc Sensor (FREESTYLE LIBRE 2 SENSOR) MISC 1 Piece by Does not apply route every 14 (fourteen) days. 6 each 2  ? diphenoxylate-atropine (LOMOTIL) 2.5-0.025 MG tablet Take 1 tablet by mouth 4 (four) times daily as needed for diarrhea or loose stools. 60 tablet 0  ? Glucosamine-Chondroitin (OSTEO BI-FLEX REGULAR STRENGTH PO) Take 2 tablets by mouth daily.    ? insulin detemir (LEVEMIR) 100 UNIT/ML FlexPen Inject 8 Units into the skin at bedtime. 15 mL 1  ? insulin lispro (HUMALOG KWIKPEN) 100 UNIT/ML KwikPen Inject 4-7 Units into the skin 3 (three) times daily before meals. 15 mL 1  ? IRON-VITAMIN C PO Take 1 tablet  by mouth daily.    ? levothyroxine (SYNTHROID) 175 MCG tablet TAKE 1 TABLET DAILY 90 tablet 1  ? levothyroxine (SYNTHROID) 25 MCG tablet Take one tablet po daily but on Mon,Wed,and Friday take 2 135 tablet

## 2021-09-18 LAB — CANCER ANTIGEN 19-9: CA 19-9: 113 U/mL — ABNORMAL HIGH (ref 0–35)

## 2021-09-24 ENCOUNTER — Inpatient Hospital Stay (HOSPITAL_COMMUNITY): Payer: BC Managed Care – PPO

## 2021-09-24 ENCOUNTER — Encounter (HOSPITAL_COMMUNITY): Payer: Self-pay

## 2021-09-24 VITALS — BP 99/60 | HR 80 | Temp 97.5°F | Resp 16 | Wt 188.2 lb

## 2021-09-24 DIAGNOSIS — Z5111 Encounter for antineoplastic chemotherapy: Secondary | ICD-10-CM | POA: Diagnosis not present

## 2021-09-24 DIAGNOSIS — K8689 Other specified diseases of pancreas: Secondary | ICD-10-CM | POA: Diagnosis not present

## 2021-09-24 DIAGNOSIS — R200 Anesthesia of skin: Secondary | ICD-10-CM | POA: Diagnosis not present

## 2021-09-24 DIAGNOSIS — R634 Abnormal weight loss: Secondary | ICD-10-CM | POA: Diagnosis not present

## 2021-09-24 DIAGNOSIS — C250 Malignant neoplasm of head of pancreas: Secondary | ICD-10-CM | POA: Diagnosis not present

## 2021-09-24 DIAGNOSIS — C259 Malignant neoplasm of pancreas, unspecified: Secondary | ICD-10-CM

## 2021-09-24 DIAGNOSIS — R748 Abnormal levels of other serum enzymes: Secondary | ICD-10-CM | POA: Diagnosis not present

## 2021-09-24 DIAGNOSIS — Z806 Family history of leukemia: Secondary | ICD-10-CM | POA: Diagnosis not present

## 2021-09-24 DIAGNOSIS — E119 Type 2 diabetes mellitus without complications: Secondary | ICD-10-CM | POA: Diagnosis not present

## 2021-09-24 DIAGNOSIS — Z8249 Family history of ischemic heart disease and other diseases of the circulatory system: Secondary | ICD-10-CM | POA: Diagnosis not present

## 2021-09-24 DIAGNOSIS — F17210 Nicotine dependence, cigarettes, uncomplicated: Secondary | ICD-10-CM | POA: Diagnosis not present

## 2021-09-24 DIAGNOSIS — Z79899 Other long term (current) drug therapy: Secondary | ICD-10-CM | POA: Diagnosis not present

## 2021-09-24 DIAGNOSIS — Z95828 Presence of other vascular implants and grafts: Secondary | ICD-10-CM

## 2021-09-24 DIAGNOSIS — M25472 Effusion, left ankle: Secondary | ICD-10-CM | POA: Diagnosis not present

## 2021-09-24 DIAGNOSIS — E785 Hyperlipidemia, unspecified: Secondary | ICD-10-CM | POA: Diagnosis not present

## 2021-09-24 DIAGNOSIS — I10 Essential (primary) hypertension: Secondary | ICD-10-CM | POA: Diagnosis not present

## 2021-09-24 LAB — CBC WITH DIFFERENTIAL/PLATELET
Abs Immature Granulocytes: 0.04 10*3/uL (ref 0.00–0.07)
Basophils Absolute: 0 10*3/uL (ref 0.0–0.1)
Basophils Relative: 1 %
Eosinophils Absolute: 0 10*3/uL (ref 0.0–0.5)
Eosinophils Relative: 0 %
HCT: 26.5 % — ABNORMAL LOW (ref 39.0–52.0)
Hemoglobin: 8.6 g/dL — ABNORMAL LOW (ref 13.0–17.0)
Immature Granulocytes: 1 %
Lymphocytes Relative: 46 %
Lymphs Abs: 1.6 10*3/uL (ref 0.7–4.0)
MCH: 33.3 pg (ref 26.0–34.0)
MCHC: 32.5 g/dL (ref 30.0–36.0)
MCV: 102.7 fL — ABNORMAL HIGH (ref 80.0–100.0)
Monocytes Absolute: 0.6 10*3/uL (ref 0.1–1.0)
Monocytes Relative: 16 %
Neutro Abs: 1.3 10*3/uL — ABNORMAL LOW (ref 1.7–7.7)
Neutrophils Relative %: 36 %
Platelets: 266 10*3/uL (ref 150–400)
RBC: 2.58 MIL/uL — ABNORMAL LOW (ref 4.22–5.81)
RDW: 22.7 % — ABNORMAL HIGH (ref 11.5–15.5)
WBC: 3.4 10*3/uL — ABNORMAL LOW (ref 4.0–10.5)
nRBC: 0.6 % — ABNORMAL HIGH (ref 0.0–0.2)

## 2021-09-24 LAB — COMPREHENSIVE METABOLIC PANEL
ALT: 24 U/L (ref 0–44)
AST: 25 U/L (ref 15–41)
Albumin: 2.5 g/dL — ABNORMAL LOW (ref 3.5–5.0)
Alkaline Phosphatase: 102 U/L (ref 38–126)
Anion gap: 5 (ref 5–15)
BUN: 15 mg/dL (ref 8–23)
CO2: 27 mmol/L (ref 22–32)
Calcium: 8.1 mg/dL — ABNORMAL LOW (ref 8.9–10.3)
Chloride: 107 mmol/L (ref 98–111)
Creatinine, Ser: 0.92 mg/dL (ref 0.61–1.24)
GFR, Estimated: >60 mL/min (ref >60)
Glucose, Bld: 123 mg/dL — ABNORMAL HIGH (ref 70–99)
Potassium: 3.9 mmol/L (ref 3.5–5.1)
Sodium: 139 mmol/L (ref 135–145)
Total Bilirubin: 0.7 mg/dL (ref 0.3–1.2)
Total Protein: 5.7 g/dL — ABNORMAL LOW (ref 6.5–8.1)

## 2021-09-24 LAB — MAGNESIUM: Magnesium: 1.8 mg/dL (ref 1.7–2.4)

## 2021-09-24 MED ORDER — SODIUM CHLORIDE 0.9 % IV SOLN
750.0000 mg/m2 | Freq: Once | INTRAVENOUS | Status: AC
  Administered 2021-09-24: 1558 mg via INTRAVENOUS
  Filled 2021-09-24: qty 40.98

## 2021-09-24 MED ORDER — SODIUM CHLORIDE 0.9 % IV SOLN: Freq: Once | INTRAVENOUS | Status: AC

## 2021-09-24 MED ORDER — SODIUM CHLORIDE 0.9% FLUSH
10.0000 mL | INTRAVENOUS | Status: DC | PRN
  Administered 2021-09-24: 10 mL

## 2021-09-24 MED ORDER — SODIUM CHLORIDE 0.9 % IV SOLN
8.0000 mg | Freq: Once | INTRAVENOUS | Status: AC
  Administered 2021-09-24: 8 mg via INTRAVENOUS
  Filled 2021-09-24: qty 4

## 2021-09-24 MED ORDER — HEPARIN SOD (PORK) LOCK FLUSH 100 UNIT/ML IV SOLN
500.0000 [IU] | Freq: Once | INTRAVENOUS | Status: AC | PRN
  Administered 2021-09-24: 500 [IU]

## 2021-09-24 NOTE — Patient Instructions (Signed)
[facility]  Discharge Instructions: ?Thank you for choosing [facility] to provide your oncology and hematology care.  ?If you have a lab appointment with the Cancer Center, please come in thru the Main Entrance and check in at the main information desk. ? ?Wear comfortable clothing and clothing appropriate for easy access to any Portacath or PICC line.  ? ?We strive to give you quality time with your provider. You may need to reschedule your appointment if you arrive late (15 or more minutes).  Arriving late affects you and other patients whose appointments are after yours.  Also, if you miss three or more appointments without notifying the office, you may be dismissed from the clinic at the provider?s discretion.    ?  ?For prescription refill requests, have your pharmacy contact our office and allow 72 hours for refills to be completed.   ? ?Today you received the following chemotherapy and/or immunotherapy agents gemzar.  ?  ?To help prevent nausea and vomiting after your treatment, we encourage you to take your nausea medication as directed. ? ?BELOW ARE SYMPTOMS THAT SHOULD BE REPORTED IMMEDIATELY: ?*FEVER GREATER THAN 100.4 F (38 ?C) OR HIGHER ?*CHILLS OR SWEATING ?*NAUSEA AND VOMITING THAT IS NOT CONTROLLED WITH YOUR NAUSEA MEDICATION ?*UNUSUAL SHORTNESS OF BREATH ?*UNUSUAL BRUISING OR BLEEDING ?*URINARY PROBLEMS (pain or burning when urinating, or frequent urination) ?*BOWEL PROBLEMS (unusual diarrhea, constipation, pain near the anus) ?TENDERNESS IN MOUTH AND THROAT WITH OR WITHOUT PRESENCE OF ULCERS (sore throat, sores in mouth, or a toothache) ?UNUSUAL RASH, SWELLING OR PAIN  ?UNUSUAL VAGINAL DISCHARGE OR ITCHING  ? ?Items with * indicate a potential emergency and should be followed up as soon as possible or go to the Emergency Department if any problems should occur. ? ?Please show the CHEMOTHERAPY ALERT CARD or IMMUNOTHERAPY ALERT CARD at check-in to the Emergency Department  and triage nurse. ? ?Should you have questions after your visit or need to cancel or reschedule your appointment, please contact [facility] [phone]  and follow the prompts.  Office hours are 8:00 a.m. to 4:30 p.m. Monday - Friday. Please note that voicemails left after 4:00 p.m. may not be returned until the following business day.  We are closed weekends and major holidays. You have access to a nurse at all times for urgent questions. Please call the main number to the clinic [phone] and follow the prompts. ? ?For any non-urgent questions, you may also contact your provider using MyChart. We now offer e-Visits for anyone 18 and older to request care online for non-urgent symptoms. For details visit [email or web address]. ?  ?Also download the MyChart app! Go to the app store, search "MyChart", open the app, select [facility], and log in with your MyChart username and password. ? ?Due to Covid, a mask is required upon entering the hospital/clinic. If you do not have a mask, one will be given to you upon arrival. For doctor visits, patients may have 1 support person aged 18 or older with them. For treatment visits, patients cannot have anyone with them due to current Covid guidelines and our immunocompromised population.  ?

## 2021-09-24 NOTE — Progress Notes (Signed)
Labs reviewed with Dr. Benay Spice. Will treat today per MD.  ? ?Treatment given per orders. Patient tolerated it well without problems. Vitals stable and discharged home from clinic ambulatory. Follow up as scheduled. ? ?

## 2021-09-26 ENCOUNTER — Other Ambulatory Visit: Payer: Self-pay | Admitting: *Deleted

## 2021-09-26 ENCOUNTER — Other Ambulatory Visit: Payer: Self-pay | Admitting: Pharmacist

## 2021-09-26 ENCOUNTER — Encounter (HOSPITAL_COMMUNITY): Payer: Self-pay | Admitting: Hematology

## 2021-09-26 ENCOUNTER — Other Ambulatory Visit (HOSPITAL_COMMUNITY): Payer: Self-pay

## 2021-09-26 ENCOUNTER — Other Ambulatory Visit (HOSPITAL_COMMUNITY): Payer: Self-pay | Admitting: Hematology

## 2021-09-26 ENCOUNTER — Other Ambulatory Visit (HOSPITAL_COMMUNITY): Payer: Self-pay | Admitting: Pharmacist

## 2021-09-26 DIAGNOSIS — C259 Malignant neoplasm of pancreas, unspecified: Secondary | ICD-10-CM

## 2021-09-26 MED ORDER — CAPECITABINE 500 MG PO TABS
1000.0000 mg | ORAL_TABLET | Freq: Two times a day (BID) | ORAL | 0 refills | Status: DC
  Filled 2021-09-26: qty 84, 28d supply, fill #0

## 2021-09-26 MED ORDER — CAPECITABINE 500 MG PO TABS
1500.0000 mg | ORAL_TABLET | Freq: Two times a day (BID) | ORAL | 0 refills | Status: DC
  Filled 2021-09-26: qty 126, 28d supply, fill #0

## 2021-09-26 NOTE — Telephone Encounter (Signed)
Xeloda script approved, per request.  Patient tolerating and per last ovn is to continue therapy. ?

## 2021-10-01 ENCOUNTER — Inpatient Hospital Stay (HOSPITAL_COMMUNITY): Payer: BC Managed Care – PPO

## 2021-10-01 VITALS — BP 109/55 | HR 72 | Temp 99.2°F | Resp 18 | Ht 73.43 in | Wt 181.2 lb

## 2021-10-01 DIAGNOSIS — M25472 Effusion, left ankle: Secondary | ICD-10-CM | POA: Diagnosis not present

## 2021-10-01 DIAGNOSIS — Z806 Family history of leukemia: Secondary | ICD-10-CM | POA: Diagnosis not present

## 2021-10-01 DIAGNOSIS — Z79899 Other long term (current) drug therapy: Secondary | ICD-10-CM | POA: Diagnosis not present

## 2021-10-01 DIAGNOSIS — C259 Malignant neoplasm of pancreas, unspecified: Secondary | ICD-10-CM

## 2021-10-01 DIAGNOSIS — R200 Anesthesia of skin: Secondary | ICD-10-CM | POA: Diagnosis not present

## 2021-10-01 DIAGNOSIS — E785 Hyperlipidemia, unspecified: Secondary | ICD-10-CM | POA: Diagnosis not present

## 2021-10-01 DIAGNOSIS — C250 Malignant neoplasm of head of pancreas: Secondary | ICD-10-CM | POA: Diagnosis not present

## 2021-10-01 DIAGNOSIS — F17210 Nicotine dependence, cigarettes, uncomplicated: Secondary | ICD-10-CM | POA: Diagnosis not present

## 2021-10-01 DIAGNOSIS — R634 Abnormal weight loss: Secondary | ICD-10-CM | POA: Diagnosis not present

## 2021-10-01 DIAGNOSIS — Z5111 Encounter for antineoplastic chemotherapy: Secondary | ICD-10-CM | POA: Diagnosis not present

## 2021-10-01 DIAGNOSIS — I10 Essential (primary) hypertension: Secondary | ICD-10-CM | POA: Diagnosis not present

## 2021-10-01 DIAGNOSIS — Z8249 Family history of ischemic heart disease and other diseases of the circulatory system: Secondary | ICD-10-CM | POA: Diagnosis not present

## 2021-10-01 DIAGNOSIS — R748 Abnormal levels of other serum enzymes: Secondary | ICD-10-CM | POA: Diagnosis not present

## 2021-10-01 DIAGNOSIS — E119 Type 2 diabetes mellitus without complications: Secondary | ICD-10-CM | POA: Diagnosis not present

## 2021-10-01 DIAGNOSIS — K8689 Other specified diseases of pancreas: Secondary | ICD-10-CM | POA: Diagnosis not present

## 2021-10-01 DIAGNOSIS — Z95828 Presence of other vascular implants and grafts: Secondary | ICD-10-CM

## 2021-10-01 LAB — CBC WITH DIFFERENTIAL/PLATELET
Abs Immature Granulocytes: 0.04 10*3/uL (ref 0.00–0.07)
Basophils Absolute: 0 10*3/uL (ref 0.0–0.1)
Basophils Relative: 0 %
Eosinophils Absolute: 0 10*3/uL (ref 0.0–0.5)
Eosinophils Relative: 1 %
HCT: 25.6 % — ABNORMAL LOW (ref 39.0–52.0)
Hemoglobin: 8.3 g/dL — ABNORMAL LOW (ref 13.0–17.0)
Immature Granulocytes: 1 %
Lymphocytes Relative: 37 %
Lymphs Abs: 1.4 10*3/uL (ref 0.7–4.0)
MCH: 34.4 pg — ABNORMAL HIGH (ref 26.0–34.0)
MCHC: 32.4 g/dL (ref 30.0–36.0)
MCV: 106.2 fL — ABNORMAL HIGH (ref 80.0–100.0)
Monocytes Absolute: 0.5 10*3/uL (ref 0.1–1.0)
Monocytes Relative: 13 %
Neutro Abs: 1.8 10*3/uL (ref 1.7–7.7)
Neutrophils Relative %: 48 %
Platelets: 219 10*3/uL (ref 150–400)
RBC: 2.41 MIL/uL — ABNORMAL LOW (ref 4.22–5.81)
RDW: 22.9 % — ABNORMAL HIGH (ref 11.5–15.5)
WBC: 3.7 10*3/uL — ABNORMAL LOW (ref 4.0–10.5)
nRBC: 0 % (ref 0.0–0.2)

## 2021-10-01 LAB — COMPREHENSIVE METABOLIC PANEL
ALT: 22 U/L (ref 0–44)
AST: 21 U/L (ref 15–41)
Albumin: 2.7 g/dL — ABNORMAL LOW (ref 3.5–5.0)
Alkaline Phosphatase: 106 U/L (ref 38–126)
Anion gap: 5 (ref 5–15)
BUN: 12 mg/dL (ref 8–23)
CO2: 23 mmol/L (ref 22–32)
Calcium: 8.2 mg/dL — ABNORMAL LOW (ref 8.9–10.3)
Chloride: 112 mmol/L — ABNORMAL HIGH (ref 98–111)
Creatinine, Ser: 0.81 mg/dL (ref 0.61–1.24)
GFR, Estimated: >60 mL/min (ref >60)
Glucose, Bld: 171 mg/dL — ABNORMAL HIGH (ref 70–99)
Potassium: 3.2 mmol/L — ABNORMAL LOW (ref 3.5–5.1)
Sodium: 140 mmol/L (ref 135–145)
Total Bilirubin: 0.5 mg/dL (ref 0.3–1.2)
Total Protein: 6 g/dL — ABNORMAL LOW (ref 6.5–8.1)

## 2021-10-01 LAB — MAGNESIUM: Magnesium: 1.7 mg/dL (ref 1.7–2.4)

## 2021-10-01 MED ORDER — SODIUM CHLORIDE 0.9 % IV SOLN
8.0000 mg | Freq: Once | INTRAVENOUS | Status: AC
  Administered 2021-10-01: 8 mg via INTRAVENOUS
  Filled 2021-10-01: qty 4

## 2021-10-01 MED ORDER — POTASSIUM CHLORIDE CRYS ER 20 MEQ PO TBCR
40.0000 meq | EXTENDED_RELEASE_TABLET | Freq: Once | ORAL | Status: AC
  Administered 2021-10-01: 40 meq via ORAL
  Filled 2021-10-01: qty 2

## 2021-10-01 MED ORDER — HEPARIN SOD (PORK) LOCK FLUSH 100 UNIT/ML IV SOLN
500.0000 [IU] | Freq: Once | INTRAVENOUS | Status: AC | PRN
  Administered 2021-10-01: 500 [IU]

## 2021-10-01 MED ORDER — SODIUM CHLORIDE 0.9 % IV SOLN
750.0000 mg/m2 | Freq: Once | INTRAVENOUS | Status: AC
  Administered 2021-10-01: 1558 mg via INTRAVENOUS
  Filled 2021-10-01: qty 40.98

## 2021-10-01 MED ORDER — SODIUM CHLORIDE 0.9 % IV SOLN: Freq: Once | INTRAVENOUS | Status: AC

## 2021-10-01 MED ORDER — SODIUM CHLORIDE 0.9% FLUSH
10.0000 mL | INTRAVENOUS | Status: DC | PRN
  Administered 2021-10-01: 10 mL

## 2021-10-01 NOTE — Patient Instructions (Signed)
[facility]  Discharge Instructions: ?Thank you for choosing [facility] to provide your oncology and hematology care.  ?If you have a lab appointment with the [facility], please come in thru the Main Entrance and check in at the main information desk. ? ?Wear comfortable clothing and clothing appropriate for easy access to any Portacath or PICC line.  ? ?We strive to give you quality time with your provider. You may need to reschedule your appointment if you arrive late (15 or more minutes).  Arriving late affects you and other patients whose appointments are after yours.  Also, if you miss three or more appointments without notifying the office, you may be dismissed from the clinic at the provider?s discretion.    ?  ?For prescription refill requests, have your pharmacy contact our office and allow 72 hours for refills to be completed.   ? ?Today you received the following chemotherapy and/or immunotherapy agents Gemzar ?  ?To help prevent nausea and vomiting after your treatment, we encourage you to take your nausea medication as directed. ? ?BELOW ARE SYMPTOMS THAT SHOULD BE REPORTED IMMEDIATELY: ?*FEVER GREATER THAN 100.4 F (38 ?C) OR HIGHER ?*CHILLS OR SWEATING ?*NAUSEA AND VOMITING THAT IS NOT CONTROLLED WITH YOUR NAUSEA MEDICATION ?*UNUSUAL SHORTNESS OF BREATH ?*UNUSUAL BRUISING OR BLEEDING ?*URINARY PROBLEMS (pain or burning when urinating, or frequent urination) ?*BOWEL PROBLEMS (unusual diarrhea, constipation, pain near the anus) ?TENDERNESS IN MOUTH AND THROAT WITH OR WITHOUT PRESENCE OF ULCERS (sore throat, sores in mouth, or a toothache) ?UNUSUAL RASH, SWELLING OR PAIN  ?UNUSUAL VAGINAL DISCHARGE OR ITCHING  ? ?Items with * indicate a potential emergency and should be followed up as soon as possible or go to the Emergency Department if any problems should occur. ? ?Please show the CHEMOTHERAPY ALERT CARD or IMMUNOTHERAPY ALERT CARD at check-in to the Emergency Department  and triage nurse. ? ?Should you have questions after your visit or need to cancel or reschedule your appointment, please contact [facility] [phone]  and follow the prompts.  Office hours are 8:00 a.m. to 4:30 p.m. Monday - Friday. Please note that voicemails left after 4:00 p.m. may not be returned until the following business day.  We are closed weekends and major holidays. You have access to a nurse at all times for urgent questions. Please call the main number to the clinic [phone] and follow the prompts. ? ?For any non-urgent questions, you may also contact your provider using MyChart. We now offer e-Visits for anyone 6 and older to request care online for non-urgent symptoms. For details visit [email or web address]. ?  ?Also download the MyChart app! Go to the app store, search "MyChart", open the app, select [facility], and log in with your MyChart username and password. ? ?Due to Covid, a mask is required upon entering the hospital/clinic. If you do not have a mask, one will be given to you upon arrival. For doctor visits, patients may have 1 support person aged 70 or older with them. For treatment visits, patients cannot have anyone with them due to current Covid guidelines and our immunocompromised population.  ? ?Gemcitabine injection ?What is this medication? ?GEMCITABINE (jem SYE ta been) is a chemotherapy drug. This medicine is used to treat many types of cancer like breast cancer, lung cancer, pancreatic cancer, and ovarian cancer. ?This medicine may be used for other purposes; ask your health care provider or pharmacist if you have questions. ?COMMON BRAND NAME(S): Gemzar, Infugem ?What should  I tell my care team before I take this medication? ?They need to know if you have any of these conditions: ?blood disorders ?infection ?kidney disease ?liver disease ?lung or breathing disease, like asthma ?recent or ongoing radiation therapy ?an unusual or allergic reaction to  gemcitabine, other chemotherapy, other medicines, foods, dyes, or preservatives ?pregnant or trying to get pregnant ?breast-feeding ?How should I use this medication? ?This drug is given as an infusion into a vein. It is administered in a hospital or clinic by a specially trained health care professional. ?Talk to your pediatrician regarding the use of this medicine in children. Special care may be needed. ?Overdosage: If you think you have taken too much of this medicine contact a poison control center or emergency room at once. ?NOTE: This medicine is only for you. Do not share this medicine with others. ?What if I miss a dose? ?It is important not to miss your dose. Call your doctor or health care professional if you are unable to keep an appointment. ?What may interact with this medication? ?medicines to increase blood counts like filgrastim, pegfilgrastim, sargramostim ?some other chemotherapy drugs like cisplatin ?vaccines ?Talk to your doctor or health care professional before taking any of these medicines: ?acetaminophen ?aspirin ?ibuprofen ?ketoprofen ?naproxen ?This list may not describe all possible interactions. Give your health care provider a list of all the medicines, herbs, non-prescription drugs, or dietary supplements you use. Also tell them if you smoke, drink alcohol, or use illegal drugs. Some items may interact with your medicine. ?What should I watch for while using this medication? ?Visit your doctor for checks on your progress. This drug may make you feel generally unwell. This is not uncommon, as chemotherapy can affect healthy cells as well as cancer cells. Report any side effects. Continue your course of treatment even though you feel ill unless your doctor tells you to stop. ?In some cases, you may be given additional medicines to help with side effects. Follow all directions for their use. ?Call your doctor or health care professional for advice if you get a fever, chills or sore  throat, or other symptoms of a cold or flu. Do not treat yourself. This drug decreases your body's ability to fight infections. Try to avoid being around people who are sick. ?This medicine may increase your risk to bruise or bleed. Call your doctor or health care professional if you notice any unusual bleeding. ?Be careful brushing and flossing your teeth or using a toothpick because you may get an infection or bleed more easily. If you have any dental work done, tell your dentist you are receiving this medicine. ?Avoid taking products that contain aspirin, acetaminophen, ibuprofen, naproxen, or ketoprofen unless instructed by your doctor. These medicines may hide a fever. ?Do not become pregnant while taking this medicine or for 6 months after stopping it. Women should inform their doctor if they wish to become pregnant or think they might be pregnant. Men should not father a child while taking this medicine and for 3 months after stopping it. There is a potential for serious side effects to an unborn child. Talk to your health care professional or pharmacist for more information. Do not breast-feed an infant while taking this medicine or for at least 1 week after stopping it. ?Men should inform their doctors if they wish to father a child. This medicine may lower sperm counts. Talk with your doctor or health care professional if you are concerned about your fertility. ?What side effects may  I notice from receiving this medication? ?Side effects that you should report to your doctor or health care professional as soon as possible: ?allergic reactions like skin rash, itching or hives, swelling of the face, lips, or tongue ?breathing problems ?pain, redness, or irritation at site where injected ?signs and symptoms of a dangerous change in heartbeat or heart rhythm like chest pain; dizziness; fast or irregular heartbeat; palpitations; feeling faint or lightheaded, falls; breathing problems ?signs of decreased  platelets or bleeding - bruising, pinpoint red spots on the skin, black, tarry stools, blood in the urine ?signs of decreased red blood cells - unusually weak or tired, feeling faint or lightheaded, falls ?signs of infect

## 2021-10-01 NOTE — Progress Notes (Signed)
Pt presents today for Gemzar per provider's order. Labs and vital WNL for treatment today. Okay to proceed with treatment today. ? ?Pt's potassoium was 3.2 today per Dr.K's standing order. Pt will receive 40 mEq potassium chloride p.o x 1 dose. ? ?Gemzar and potassium chloride 57mq p.o x 1 dose given today per MD orders. Tolerated infusion without adverse affects. Vital signs stable. No complaints at this time. Discharged from clinic ambulatory in stable condition. Alert and oriented x 3. [facility].   ?

## 2021-10-04 ENCOUNTER — Other Ambulatory Visit (HOSPITAL_COMMUNITY): Payer: Self-pay

## 2021-10-10 ENCOUNTER — Ambulatory Visit (INDEPENDENT_AMBULATORY_CARE_PROVIDER_SITE_OTHER): Payer: BC Managed Care – PPO | Admitting: "Endocrinology

## 2021-10-10 ENCOUNTER — Encounter: Payer: Self-pay | Admitting: "Endocrinology

## 2021-10-10 VITALS — BP 106/62 | HR 76 | Ht 73.0 in | Wt 186.2 lb

## 2021-10-10 DIAGNOSIS — E039 Hypothyroidism, unspecified: Secondary | ICD-10-CM

## 2021-10-10 DIAGNOSIS — K8681 Exocrine pancreatic insufficiency: Secondary | ICD-10-CM

## 2021-10-10 DIAGNOSIS — K8689 Other specified diseases of pancreas: Secondary | ICD-10-CM

## 2021-10-10 DIAGNOSIS — E089 Diabetes mellitus due to underlying condition without complications: Secondary | ICD-10-CM

## 2021-10-10 DIAGNOSIS — E782 Mixed hyperlipidemia: Secondary | ICD-10-CM | POA: Diagnosis not present

## 2021-10-10 LAB — POCT GLYCOSYLATED HEMOGLOBIN (HGB A1C): HbA1c, POC (controlled diabetic range): 5.8 % (ref 0.0–7.0)

## 2021-10-10 MED ORDER — POTASSIUM CHLORIDE CRYS ER 20 MEQ PO TBCR: 20.0000 meq | EXTENDED_RELEASE_TABLET | Freq: Every day | ORAL | 0 refills | Status: DC

## 2021-10-10 MED ORDER — FREESTYLE LIBRE 3 SENSOR MISC: 1.0000 | 1 refills | Status: DC

## 2021-10-10 NOTE — Progress Notes (Signed)
10/10/2021, 12:11 PM  Endocrinology follow-up note   Subjective:    Patient ID: Alexander Banks, male    DOB: March 06, 1956.  Alexander Banks is being seen in follow-up after he was seen in consultation for management of currently uncontrolled symptomatic diabetes requested by  [name], MD. Patient is being considered for possible Whipple's procedure for locally advanced pancreatic cancer.   Past Medical History:  Diagnosis Date   Anemia, iron deficiency    Diabetes mellitus without complication ([place])    on meds   Hearing loss    Bil hearing aids   Hyperlipidemia    Hypertension    Port-A-Cath in place 12/26/2020   Prediabetes    Sleep apnea     Past Surgical History:  Procedure Laterality Date   BILIARY STENT PLACEMENT N/A 11/09/2020   Procedure: BILIARY STENT PLACEMENT - 46 F X 5 CM STENT;  Surgeon: Rogene Houston, MD;  Location: [facility];  Service: Endoscopy;  Laterality: N/A;   BILIARY STENT PLACEMENT N/A 12/27/2020   Procedure: BILIARY STENT PLACEMENT;  Surgeon: Rogene Houston, MD;  Location: [facility];  Service: Endoscopy;  Laterality: N/A;   COLONOSCOPY     ERCP N/A 11/09/2020   Procedure: ENDOSCOPIC RETROGRADE CHOLANGIOPANCREATOGRAPHY (ERCP);  Surgeon: Rogene Houston, MD;  Location: [facility];  Service: Endoscopy;  Laterality: N/A;   ERCP N/A 12/27/2020   Procedure: ENDOSCOPIC RETROGRADE CHOLANGIOPANCREATOGRAPHY (ERCP);  Surgeon: Rogene Houston, MD;  Location: [facility];  Service: Endoscopy;  Laterality: N/A;   ESOPHAGOGASTRODUODENOSCOPY (EGD) WITH PROPOFOL N/A 11/30/2020   Procedure: ESOPHAGOGASTRODUODENOSCOPY (EGD) WITH PROPOFOL;  Surgeon: Milus Banister, MD;  Location: [facility];  Service: Endoscopy;  Laterality: N/A;   EUS N/A 11/30/2020   Procedure: UPPER ENDOSCOPIC ULTRASOUND (EUS) RADIAL;  Surgeon: Milus Banister, MD;  Location: [facility];  Service: Endoscopy;   Laterality: N/A;   FINE NEEDLE ASPIRATION N/A 11/30/2020   Procedure: FINE NEEDLE ASPIRATION (FNA) LINEAR;  Surgeon: Milus Banister, MD;  Location: [facility];  Service: Endoscopy;  Laterality: N/A;   GIVENS CAPSULE STUDY     IR IMAGING GUIDED PORT INSERTION  12/18/2020   KNEE SURGERY     4 surgeries on left, 2 on right knee   SHOULDER SURGERY     right shoulder   SPHINCTEROTOMY N/A 11/09/2020   Procedure: SHORT BILIARY SPHINCTEROTOMY;  Surgeon: Rogene Houston, MD;  Location: [facility];  Service: Endoscopy;  Laterality: N/A;   WHIPPLE PROCEDURE      Social History   Socioeconomic History   Marital status: Married    Spouse name: Not on file   Number of children: Not on file   Years of education: Not on file   Highest education level: Not on file  Occupational History   Not on file  Tobacco Use   Smoking status: Every Day    Types: Cigars   Smokeless tobacco: Never   Tobacco comments:    Smoke 3-4 cigars a day  Vaping Use   Vaping Use: Never used  Substance and Sexual Activity   Alcohol use: Yes  Comment: occasional   Drug use: Never   Sexual activity: Not on file  Other Topics Concern   Not on file  Social History Narrative   Not on file   Social Determinants of Health   Financial Resource Strain: Low Risk    Difficulty of Paying Living Expenses: Not hard at all  Food Insecurity: No Food Insecurity   Worried About Running Out of Food in the Last Year: Never true   [place] in the Last Year: Never true  Transportation Needs: No Transportation Needs   Lack of Transportation (Medical): No   Lack of Transportation (Non-Medical): No  Physical Activity: Sufficiently Active   Days of Exercise per Week: 5 days   Minutes of Exercise per Session: 30 min  Stress: Not on file  Social Connections: Not on file    Family History  Problem Relation Age of Onset   Heart attack Father    Heart attack Paternal Grandfather    Leukemia Sister    Heart attack  Brother     Outpatient Encounter Medications as of 10/10/2021  Medication Sig   Continuous Blood Gluc Sensor (FREESTYLE LIBRE 3 SENSOR) MISC 1 Piece by Does not apply route every 14 (fourteen) days. Place 1 sensor on the skin every 14 days. Use to check glucose continuously   potassium chloride SA (KLOR-CON M) 20 MEQ tablet Take 1 tablet (20 mEq total) by mouth daily.   capecitabine (XELODA) 500 MG tablet Take 2 tablets (1,000 mg total) by mouth 2 (two) times daily after a meal. Take for 21 days on, followed by 7 days off. Repeat every 28 days.   clobetasol cream (TEMOVATE) 2.50 % Apply 1 application. topically 2 (two) times daily.   diphenoxylate-atropine (LOMOTIL) 2.5-0.025 MG tablet Take 1 tablet by mouth 4 (four) times daily as needed for diarrhea or loose stools.   Glucosamine-Chondroitin (OSTEO BI-FLEX REGULAR STRENGTH PO) Take 2 tablets by mouth daily.   insulin detemir (LEVEMIR) 100 UNIT/ML FlexPen Inject 8 Units into the skin at bedtime.   insulin lispro (HUMALOG KWIKPEN) 100 UNIT/ML KwikPen Inject 4-7 Units into the skin 3 (three) times daily before meals.   IRON-VITAMIN C PO Take 1 tablet by mouth daily.   levothyroxine (SYNTHROID) 175 MCG tablet TAKE 1 TABLET DAILY   levothyroxine (SYNTHROID) 25 MCG tablet Take one tablet po daily but on Mon,Wed,and Friday take 2   lipase/protease/amylase (CREON) 36000 UNITS CPEP capsule Take 2 capsules (72,000 Units total) by mouth 3 (three) times daily with meals. May also take 1 capsule (36,000 Units total) as needed (with snacks).   oxyCODONE-acetaminophen (PERCOCET/ROXICET) 5-325 MG tablet Take 1 tablet by mouth every 6 (six) hours as needed for severe pain.   sildenafil (VIAGRA) 100 MG tablet 50 mg as needed.   triamcinolone cream (KENALOG) 0.1 % Apply to affected areas BID prn   [DISCONTINUED] ciprofloxacin (CIPRO) 500 MG tablet Take 1 tablet (500 mg total) by mouth 2 (two) times daily.   [DISCONTINUED] Continuous Blood Gluc Receiver (FREESTYLE  LIBRE 2 READER) DEVI As directed   [DISCONTINUED] Continuous Blood Gluc Sensor (FREESTYLE LIBRE 2 SENSOR) MISC 1 Piece by Does not apply route every 14 (fourteen) days.   No facility-administered encounter medications on file as of 10/10/2021.    ALLERGIES: Allergies  Allergen Reactions   Lisinopril Cough    VACCINATION STATUS: Immunization History  Administered Date(s) Administered   Influenza,inj,Quad PF,6+ Mos 04/15/2018, 04/09/2019, 02/28/2021   Pneumococcal Polysaccharide-23 08/10/2013   Td 06/18/2002, 11/16/2009  Hyperlipidemia This is a chronic problem. The current episode started more than 1 year ago. He has tried statins for the symptoms.  Diabetes He presents for his follow-up diabetic visit. He has type 2 diabetes mellitus. His disease course has been improving. There are no hypoglycemic associated symptoms. Pertinent negatives for hypoglycemia include no confusion, pallor or seizures. Associated symptoms include polydipsia and polyuria. Pertinent negatives for diabetes include no polyphagia. There are no hypoglycemic complications. Risk factors for coronary artery disease include diabetes mellitus, dyslipidemia and male sex. Current diabetic treatment includes insulin injections. His weight is fluctuating minimally. He is following a generally unhealthy diet. He has had a previous visit with a dietitian. His home blood glucose trend is decreasing steadily. His breakfast blood glucose range is generally 110-130 mg/dl. His lunch blood glucose range is generally 110-130 mg/dl. His dinner blood glucose range is generally 110-130 mg/dl. His bedtime blood glucose range is generally 110-130 mg/dl. His overall blood glucose range is 110-130 mg/dl. (Mr. Lebeau presents with his CGM.  AGP report shows 91% time range, 1% slightly above range.  He remains on low-dose basal/bolus insulin.  No hypoglycemia.  His point-of-care A1c is 5.8% today, overall improving from 12.8% overall. His  diabetes is pancreatic diabetes status post Whipple procedure for locally invasive pancreatic cancer.  He remains on Creon 1-2 capsules 3-4 times a day.  He has maintained his weight.  )    Review of Systems  Constitutional:  Negative for unexpected weight change.  HENT:  Negative for dental problem, mouth sores and trouble swallowing.   Eyes:  Negative for visual disturbance.  Respiratory:  Negative for choking, chest tightness, shortness of breath and wheezing.   Cardiovascular:  Negative for palpitations and leg swelling.  Gastrointestinal:  Negative for abdominal distention, constipation and diarrhea.  Endocrine: Positive for polydipsia and polyuria. Negative for polyphagia.  Genitourinary:  Negative for dysuria, flank pain, hematuria and urgency.  Musculoskeletal:  Negative for back pain and gait problem.  Skin:  Negative for pallor and wound.  Neurological:  Negative for seizures and syncope.  Psychiatric/Behavioral:  Negative for confusion and dysphoric mood.    Objective:       10/10/2021    9:27 AM 10/01/2021    3:46 PM 10/01/2021   12:46 PM  Vitals with BMI  Height '6\' 1"'$   6' 1.425"  Weight 186 lbs 3 oz  181 lbs 3 oz  BMI 92.42  68.34  Systolic 196 222 979  Diastolic 62 55 61  Pulse 76 72 90    BP 106/62   Pulse 76   Ht '6\' 1"'$  (1.854 m)   Wt 186 lb 3.2 oz (84.5 kg)   BMI 24.57 kg/m   Wt Readings from Last 3 Encounters:  10/10/21 186 lb 3.2 oz (84.5 kg)  10/01/21 181 lb 3.5 oz (82.2 kg)  09/24/21 188 lb 3.2 oz (85.4 kg)       CMP ( most recent) CMP     Component Value Date/Time   NA 140 10/01/2021 1219   NA 140 04/30/2021 1655   K 3.2 (L) 10/01/2021 1219   CL 112 (H) 10/01/2021 1219   CO2 23 10/01/2021 1219   GLUCOSE 171 (H) 10/01/2021 1219   BUN 12 10/01/2021 1219   BUN 12 04/30/2021 1655   CREATININE 0.81 10/01/2021 1219   CREATININE 1.10 05/31/2013 1020   CALCIUM 8.2 (L) 10/01/2021 1219   PROT 6.0 (L) 10/01/2021 1219   PROT 6.3 07/19/2021 0934  ALBUMIN 2.7 (L) 10/01/2021 1219   ALBUMIN 3.2 (L) 07/19/2021 0934   AST 21 10/01/2021 1219   ALT 22 10/01/2021 1219   ALKPHOS 106 10/01/2021 1219   BILITOT 0.5 10/01/2021 1219   BILITOT 0.4 07/19/2021 0934   GFRNONAA >60 10/01/2021 1219   GFRAA 87 06/02/2020 0812     Diabetic Labs (most recent): Lab Results  Component Value Date   HGBA1C 5.8 10/10/2021   HGBA1C 6.8 04/09/2021   HGBA1C 7.9 (A) 01/02/2021     Lipid Panel ( most recent) Lipid Panel     Component Value Date/Time   CHOL 78 (L) 07/19/2021 0937   TRIG 67 07/19/2021 0937   HDL 30 (L) 07/19/2021 0937   CHOLHDL 2.6 07/19/2021 0937   CHOLHDL 3.5 05/31/2013 1020   VLDL 23 05/31/2013 1020   LDLCALC 33 07/19/2021 0937   LABVLDL 15 07/19/2021 0937      Lab Results  Component Value Date   TSH 9.410 (H) 07/19/2021   TSH 39.931 (H) 05/31/2021   TSH 3.350 12/22/2020   TSH 0.529 11/03/2020   TSH 0.438 (L) 06/02/2020   TSH 1.070 11/24/2019   TSH 5.940 (H) 04/05/2019   TSH 0.225 (L) 10/07/2018   TSH 1.210 04/08/2018   TSH 0.344 (L) 07/17/2017   FREET4 0.88 05/31/2021   FREET4 1.22 12/22/2020   FREET4 1.65 12/09/2016   FREET4 0.92 11/28/2014      Assessment & Plan:   1. Type 2 diabetes mellitus with stage 3a chronic kidney disease, with long-term current use of insulin ([place])   - Panayiotis REILLY BLADES has currently uncontrolled symptomatic type 2 DM since  66 years of age.  Mr. Hoyt presents with his CGM.  AGP report shows 91% time range, 1% slightly above range.  He remains on low-dose basal/bolus insulin.  No hypoglycemia.  His point-of-care A1c is 5.8% today, overall improving from 12.8% overall. His diabetes is pancreatic diabetes status post Whipple procedure for locally invasive pancreatic cancer.  He remains on Creon 1-2 capsules 3-4 times a day.  He has maintained his weight.  -His interval developments were reviewed.  He was diagnosed with pancreatic cancer, currently being prepared for rounds of  chemotherapy to be followed by surgery.   - I had a long discussion with him about the progressive nature of diabetes and the pathology behind its complications. He will need further study to classify his diabetes properly.  He will have an Tylosin antibodies and antiglutamic acid decarboxylase antibodies measurements. -his diabetes is complicated by recent severe hyperglycemia, DKA and he remains at a high risk for more acute and chronic complications which include CAD, CVA, CKD, retinopathy, and neuropathy. These are all discussed in detail with him.  - I have counseled him on diet  and weight management  by adopting a carbohydrate restricted/protein rich diet. Patient is encouraged to switch to  unprocessed or minimally processed     complex starch and increased protein intake (animal or plant source), fruits, and vegetables. -  he is advised to stick to a routine mealtimes to eat 3 meals  a day and avoid unnecessary snacks ( to snack only to correct hypoglycemia).   - he acknowledges that there is a room for improvement in his food and drink choices. - Suggestion is made for him to avoid simple carbohydrates  from his diet including Cakes, Sweet Desserts, Ice Cream, Soda (diet and regular), Sweet Tea, Candies, Chips, Cookies, Store Bought Juices, Alcohol in Excess of  1-2 drinks  a day, Artificial Sweeteners,  Coffee Creamer, and "Sugar-free" Products, Lemonade. This will help patient to have more stable blood glucose profile and potentially avoid unintended weight gain.  - he will be scheduled with Jearld Fenton, RDN, CDE for diabetes education.  He has seen her before, lost contact for the last 3 years.  - I have approached him with the following individualized plan to manage  his diabetes and patient agrees:   -He is status post Whipple procedure.  Evidently he probably has some functioning pancreatic issue left.  He will be requiring insulin, however and not much lower dose than expected   He will be kept off of GLP-1's of agonists, SGLT2 inhibitors, DPP 4 inhibitors, nor metformin.  He will continue on intensive basal/bolus insulin in order for him to  maintain control of diabetes to target.    -His is advised to continue on the lowered dose of Levemir at 8 units nightly, Humalog lowered to 4 units 3  times a day with meals  for pre-meal BG readings of 90-'150mg'$ /dl, plus patient specific correction dose for unexpected hyperglycemia above '150mg'$ /dl, associated with strict monitoring of glucose 4 times a day-before meals and at bedtime. - he is warned not to take insulin without proper monitoring per orders. - Adjustment parameters are given to him for hypo and hyperglycemia in writing. - he is encouraged to call clinic for blood glucose levels less than 70 or above 300 mg /dl. -He is benefiting from CGM.  He is advised to continue his CGM  continuously.   - Specific targets for  A1c;  LDL, HDL,  and Triglycerides were discussed with the patient.  2) Blood Pressure /Hypertension:    His blood pressure is controlled to target.  He is currently not on any antihypertensive medications.  3) Lipids/Hyperlipidemia:   Review of his recent lipid panel showed uncontrolled triglyceride at 182, controlled LDL at 51.  He is currently on Crestor 40 mg p.o. nightly.    4)  Weight/Diet:  Body mass index is 24.57 kg/m.  -This patient weighed as high as 285 in the past.  Over the years he lost weight, initially intentionally, later unintentionally.  He has history of moderate alcohol consumption for decades.  He is not suspect for malabsorption at this time.  He is not a candidate for weight loss.  He is advised to continue Creon 72 units 3 times a day with meals.   5) hypothyroidism: Circumstance of diagnosis is not available.  He denies any surgery or thyroid ablation.  He has taken thyroid hormone for the last 2 years.  His previsit labs confirm Hashimoto's thyroiditis as a cause of  hypothyroidism.  His recent thyroid function test show high TSH mainly due to malabsorption of thyroid hormone.  His PMD added 25 mcg extra levothyroxine to make 200 mcg p.o. daily.  He will need thyroid function test measurement in 3 months.     6) Chronic Care/Health Maintenance:  -he  is on Statin medications and  is encouraged to initiate and continue to follow up with Ophthalmology, Dentist,  Podiatrist at least yearly or according to recommendations, and advised to  quit smoking. I have recommended yearly flu vaccine and pneumonia vaccine at least every 5 years; moderate intensity exercise for up to 150 minutes weekly; and  sleep for at least 7 hours a day.  He is encouraged to continue follow-up with his oncology team regarding a recent diagnosis of pancreatic cancer.    The patient  was counseled on the dangers of tobacco use, and was advised to quit.  Reviewed strategies to maximize success, including removing cigarettes and smoking materials from environment.  - he is  advised to maintain close follow up with [name], MD for primary care needs, as well as his other providers for optimal and coordinated care.   I spent 41 minutes in the care of the patient today including review of labs from [facility], Lipids, Thyroid Function, Hematology (current and previous including abstractions from other facilities); face-to-face time discussing  his blood glucose readings/logs, discussing hypoglycemia and hyperglycemia episodes and symptoms, medications doses, his options of short and long term treatment based on the latest standards of care / guidelines;  discussion about incorporating lifestyle medicine;  and documenting the encounter.    Please refer to Patient Instructions for Blood Glucose Monitoring and Insulin/Medications Dosing Guide"  in media tab for additional information. Please  also refer to " Patient Self Inventory" in the Media  tab for reviewed elements of pertinent patient  history.  Alexander Banks participated in the discussions, expressed understanding, and voiced agreement with the above plans.  All questions were answered to his satisfaction. he is encouraged to contact clinic should he have any questions or concerns prior to his return visit.   Follow up plan: - Return in about 4 months (around 02/10/2022) for Bring Meter/CGM Device/Logs- A1c in Office.  Glade Lloyd, MD [facility] [address] [place], [place] [address] Phone: [phone]  Fax: [phone]    10/10/2021, 12:11 PM  This note was partially dictated with voice recognition software. Similar sounding words can be transcribed inadequately or may not  be corrected upon review.

## 2021-10-16 ENCOUNTER — Inpatient Hospital Stay (HOSPITAL_COMMUNITY): Payer: BC Managed Care – PPO

## 2021-10-16 ENCOUNTER — Inpatient Hospital Stay (HOSPITAL_BASED_OUTPATIENT_CLINIC_OR_DEPARTMENT_OTHER): Payer: BC Managed Care – PPO | Admitting: Hematology

## 2021-10-16 VITALS — BP 95/62 | HR 81 | Temp 98.6°F | Resp 18

## 2021-10-16 DIAGNOSIS — Z79899 Other long term (current) drug therapy: Secondary | ICD-10-CM | POA: Diagnosis not present

## 2021-10-16 DIAGNOSIS — F17210 Nicotine dependence, cigarettes, uncomplicated: Secondary | ICD-10-CM | POA: Diagnosis not present

## 2021-10-16 DIAGNOSIS — M25472 Effusion, left ankle: Secondary | ICD-10-CM | POA: Diagnosis not present

## 2021-10-16 DIAGNOSIS — E785 Hyperlipidemia, unspecified: Secondary | ICD-10-CM | POA: Diagnosis not present

## 2021-10-16 DIAGNOSIS — R200 Anesthesia of skin: Secondary | ICD-10-CM | POA: Diagnosis not present

## 2021-10-16 DIAGNOSIS — Z95828 Presence of other vascular implants and grafts: Secondary | ICD-10-CM

## 2021-10-16 DIAGNOSIS — C259 Malignant neoplasm of pancreas, unspecified: Secondary | ICD-10-CM | POA: Diagnosis not present

## 2021-10-16 DIAGNOSIS — I10 Essential (primary) hypertension: Secondary | ICD-10-CM | POA: Diagnosis not present

## 2021-10-16 DIAGNOSIS — R748 Abnormal levels of other serum enzymes: Secondary | ICD-10-CM | POA: Diagnosis not present

## 2021-10-16 DIAGNOSIS — E119 Type 2 diabetes mellitus without complications: Secondary | ICD-10-CM | POA: Diagnosis not present

## 2021-10-16 DIAGNOSIS — K8689 Other specified diseases of pancreas: Secondary | ICD-10-CM | POA: Diagnosis not present

## 2021-10-16 DIAGNOSIS — C250 Malignant neoplasm of head of pancreas: Secondary | ICD-10-CM | POA: Diagnosis not present

## 2021-10-16 DIAGNOSIS — R634 Abnormal weight loss: Secondary | ICD-10-CM | POA: Diagnosis not present

## 2021-10-16 DIAGNOSIS — Z806 Family history of leukemia: Secondary | ICD-10-CM | POA: Diagnosis not present

## 2021-10-16 DIAGNOSIS — Z8249 Family history of ischemic heart disease and other diseases of the circulatory system: Secondary | ICD-10-CM | POA: Diagnosis not present

## 2021-10-16 DIAGNOSIS — Z5111 Encounter for antineoplastic chemotherapy: Secondary | ICD-10-CM | POA: Diagnosis not present

## 2021-10-16 LAB — COMPREHENSIVE METABOLIC PANEL
ALT: 22 U/L (ref 0–44)
AST: 28 U/L (ref 15–41)
Albumin: 2.4 g/dL — ABNORMAL LOW (ref 3.5–5.0)
Alkaline Phosphatase: 150 U/L — ABNORMAL HIGH (ref 38–126)
Anion gap: 3 — ABNORMAL LOW (ref 5–15)
BUN: 12 mg/dL (ref 8–23)
CO2: 25 mmol/L (ref 22–32)
Calcium: 7.8 mg/dL — ABNORMAL LOW (ref 8.9–10.3)
Chloride: 109 mmol/L (ref 98–111)
Creatinine, Ser: 0.89 mg/dL (ref 0.61–1.24)
GFR, Estimated: >60 mL/min (ref >60)
Glucose, Bld: 134 mg/dL — ABNORMAL HIGH (ref 70–99)
Potassium: 4.5 mmol/L (ref 3.5–5.1)
Sodium: 137 mmol/L (ref 135–145)
Total Bilirubin: 0.6 mg/dL (ref 0.3–1.2)
Total Protein: 5.4 g/dL — ABNORMAL LOW (ref 6.5–8.1)

## 2021-10-16 LAB — CBC WITH DIFFERENTIAL/PLATELET
Abs Immature Granulocytes: 0.04 10*3/uL (ref 0.00–0.07)
Basophils Absolute: 0.1 10*3/uL (ref 0.0–0.1)
Basophils Relative: 1 %
Eosinophils Absolute: 0.1 10*3/uL (ref 0.0–0.5)
Eosinophils Relative: 1 %
HCT: 31 % — ABNORMAL LOW (ref 39.0–52.0)
Hemoglobin: 10 g/dL — ABNORMAL LOW (ref 13.0–17.0)
Immature Granulocytes: 1 %
Lymphocytes Relative: 32 %
Lymphs Abs: 1.8 10*3/uL (ref 0.7–4.0)
MCH: 35.1 pg — ABNORMAL HIGH (ref 26.0–34.0)
MCHC: 32.3 g/dL (ref 30.0–36.0)
MCV: 108.8 fL — ABNORMAL HIGH (ref 80.0–100.0)
Monocytes Absolute: 0.9 10*3/uL (ref 0.1–1.0)
Monocytes Relative: 16 %
Neutro Abs: 2.9 10*3/uL (ref 1.7–7.7)
Neutrophils Relative %: 49 %
Platelets: 363 10*3/uL (ref 150–400)
RBC: 2.85 MIL/uL — ABNORMAL LOW (ref 4.22–5.81)
RDW: 20.9 % — ABNORMAL HIGH (ref 11.5–15.5)
WBC: 5.8 10*3/uL (ref 4.0–10.5)
nRBC: 0 % (ref 0.0–0.2)

## 2021-10-16 LAB — MAGNESIUM: Magnesium: 1.8 mg/dL (ref 1.7–2.4)

## 2021-10-16 MED ORDER — SODIUM CHLORIDE 0.9 % IV SOLN: Freq: Once | INTRAVENOUS | Status: AC

## 2021-10-16 MED ORDER — HEPARIN SOD (PORK) LOCK FLUSH 100 UNIT/ML IV SOLN
500.0000 [IU] | Freq: Once | INTRAVENOUS | Status: AC | PRN
  Administered 2021-10-16: 500 [IU]

## 2021-10-16 MED ORDER — SODIUM CHLORIDE 0.9 % IV SOLN
750.0000 mg/m2 | Freq: Once | INTRAVENOUS | Status: AC
  Administered 2021-10-16: 1558 mg via INTRAVENOUS
  Filled 2021-10-16: qty 40.98

## 2021-10-16 MED ORDER — SODIUM CHLORIDE 0.9 % IV SOLN
8.0000 mg | Freq: Once | INTRAVENOUS | Status: AC
  Administered 2021-10-16: 8 mg via INTRAVENOUS
  Filled 2021-10-16: qty 4

## 2021-10-16 MED ORDER — SODIUM CHLORIDE 0.9% FLUSH
10.0000 mL | INTRAVENOUS | Status: DC | PRN
  Administered 2021-10-16: 10 mL

## 2021-10-16 NOTE — Progress Notes (Signed)
Patient is taking Xeloda as prescribed.  He has missed doses and reports side effects at this time.

## 2021-10-16 NOTE — Patient Instructions (Signed)
[facility]  Discharge Instructions: Thank you for choosing [facility] to provide your oncology and hematology care.  If you have a lab appointment with the Cancer Center, please come in thru the Main Entrance and check in at the main information desk.  Wear comfortable clothing and clothing appropriate for easy access to any Portacath or PICC line.   We strive to give you quality time with your provider. You may need to reschedule your appointment if you arrive late (15 or more minutes).  Arriving late affects you and other patients whose appointments are after yours.  Also, if you miss three or more appointments without notifying the office, you may be dismissed from the clinic at the provider's discretion.      For prescription refill requests, have your pharmacy contact our office and allow 72 hours for refills to be completed.    Today you received the following chemotherapy and/or immunotherapy agents Gemzar      To help prevent nausea and vomiting after your treatment, we encourage you to take your nausea medication as directed.  BELOW ARE SYMPTOMS THAT SHOULD BE REPORTED IMMEDIATELY: *FEVER GREATER THAN 100.4 F (38 C) OR HIGHER *CHILLS OR SWEATING *NAUSEA AND VOMITING THAT IS NOT CONTROLLED WITH YOUR NAUSEA MEDICATION *UNUSUAL SHORTNESS OF BREATH *UNUSUAL BRUISING OR BLEEDING *URINARY PROBLEMS (pain or burning when urinating, or frequent urination) *BOWEL PROBLEMS (unusual diarrhea, constipation, pain near the anus) TENDERNESS IN MOUTH AND THROAT WITH OR WITHOUT PRESENCE OF ULCERS (sore throat, sores in mouth, or a toothache) UNUSUAL RASH, SWELLING OR PAIN  UNUSUAL VAGINAL DISCHARGE OR ITCHING   Items with * indicate a potential emergency and should be followed up as soon as possible or go to the Emergency Department if any problems should occur.  Please show the CHEMOTHERAPY ALERT CARD or IMMUNOTHERAPY ALERT CARD at check-in to the Emergency  Department and triage nurse.  Should you have questions after your visit or need to cancel or reschedule your appointment, please contact [facility] [phone]  and follow the prompts.  Office hours are 8:00 a.m. to 4:30 p.m. Monday - Friday. Please note that voicemails left after 4:00 p.m. may not be returned until the following business day.  We are closed weekends and major holidays. You have access to a nurse at all times for urgent questions. Please call the main number to the clinic [phone] and follow the prompts.  For any non-urgent questions, you may also contact your provider using MyChart. We now offer e-Visits for anyone 18 and older to request care online for non-urgent symptoms. For details visit [email or web address].   Also download the MyChart app! Go to the app store, search "MyChart", open the app, select [facility], and log in with your MyChart username and password.  Due to Covid, a mask is required upon entering the hospital/clinic. If you do not have a mask, one will be given to you upon arrival. For doctor visits, patients may have 1 support person aged 18 or older with them. For treatment visits, patients cannot have anyone with them due to current Covid guidelines and our immunocompromised population.  

## 2021-10-16 NOTE — Patient Instructions (Addendum)
[facility] Discharge Instructions   You were seen and examined today by [name].  He reviewed the results of your lab work which is normal/stable.   Continue Xeloda 2 pills twice a day three weeks on, one week off.   We will proceed with your treatment today.  Return as scheduled.    Thank you for choosing [place] [facility] to provide your oncology and hematology care.  To afford each patient quality time with our provider, please arrive at least 15 minutes before your scheduled appointment time.   If you have a lab appointment with the [facility] please come in thru the Main Entrance and check in at the main information desk.  You need to re-schedule your appointment should you arrive 10 or more minutes late.  We strive to give you quality time with our providers, and arriving late affects you and other patients whose appointments are after yours.  Also, if you no show three or more times for appointments you may be dismissed from the clinic at the providers discretion.     Again, thank you for choosing [facility].  Our hope is that these requests will decrease the amount of time that you wait before being seen by our physicians.       _____________________________________________________________  Should you have questions after your visit to [facility], please contact our office at [phone] and follow the prompts.  Our office hours are 8:00 a.m. and 4:30 p.m. Monday - Friday.  Please note that voicemails left after 4:00 p.m. may not be returned until the following business day.  We are closed weekends and major holidays.  You do have access to a nurse 24-7, just call the main number to the clinic [phone] and do not press any options, hold on the line and a nurse will answer the phone.    For prescription refill requests, have your pharmacy contact our office and allow 72  hours.    Due to Covid, you will need to wear a mask upon entering the hospital. If you do not have a mask, a mask will be given to you at the Main Entrance upon arrival. For doctor visits, patients may have 1 support person age 80 or older with them. For treatment visits, patients can not have anyone with them due to social distancing guidelines and our immunocompromised population.

## 2021-10-16 NOTE — Progress Notes (Signed)
[place], [place] [address]   CLINIC:  Medical Oncology/Hematology  PCP:  [name], MD [place] / [place] [address] [phone]   REASON FOR VISIT:  Follow-up for pancreatic adenocarcinoma  PRIOR THERAPY: FOLFIRINOX every 2 weeks x 4 cycles  NGS Results: not done  CURRENT THERAPY: Gemcitabine D1,8,15 + Capecitabine D1-21 q28d x 6 cycles  BRIEF ONCOLOGIC HISTORY:  Oncology History  Pancreatic adenocarcinoma ([place])  12/04/2020 Initial Diagnosis   Pancreatic adenocarcinoma ([place])    01/10/2021 - 03/16/2021 Chemotherapy   Patient is on Treatment Plan : PANCREAS Modified FOLFIRINOX q14d x 4 cycles       Genetic Testing   [facility] CancerNext-Expanded Panel+RNA was Negative. Of note, a variant of uncertain significance was detected in the BRCA1 gene. Report date is 04/23/2021.  The CancerNext-Expanded gene panel offered by [facility] and includes sequencing, rearrangement, and RNA analysis for the following 77 genes: AIP, ALK, APC, ATM, AXIN2, BAP1, BARD1, BLM, BMPR1A, BRCA1, BRCA2, BRIP1, CDC73, CDH1, CDK4, CDKN1B, CDKN2A, CHEK2, CTNNA1, DICER1, FANCC, FH, FLCN, GALNT12, KIF1B, LZTR1, MAX, MEN1, MET, MLH1, MSH2, MSH3, MSH6, MUTYH, NBN, NF1, NF2, NTHL1, PALB2, PHOX2B, PMS2, POT1, PRKAR1A, PTCH1, PTEN, RAD51C, RAD51D, RB1, RECQL, RET, SDHA, SDHAF2, SDHB, SDHC, SDHD, SMAD4, SMARCA4, SMARCB1, SMARCE1, STK11, SUFU, TMEM127, TP53, TSC1, TSC2, VHL and XRCC2 (sequencing and deletion/duplication); EGFR, EGLN1, HOXB13, KIT, MITF, PDGFRA, POLD1, and POLE (sequencing only); EPCAM and GREM1 (deletion/duplication only).    07/23/2021 -  Chemotherapy   Patient is on Treatment Plan : PANCREAS Gemcitabine D1,8,15 + Capecitabine D1-21 q28d x 6 cycles        CANCER STAGING:  Cancer Staging  Pancreatic adenocarcinoma [facility]) Staging form: Exocrine Pancreas, AJCC 8th Edition - Clinical stage from 12/04/2020: Stage IB (cT2, cN0, cM0) -  Unsigned   INTERVAL HISTORY:  [name], a 66 y.o. male, returns for routine follow-up and consideration for next cycle of chemotherapy. Adler was last seen on 09/17/2021.  Due for cycle #4 of Gemcitabine today.   Overall, he tells me he has been feeling pretty well. He reports his skin peeling is still present, but it is improved since decreasing to 2 tablets of Xeloda BID. He did not take Xeloda for 3 days last week as he reports he had weekend plans. He denies n/v/d/c. He reports loose stools and flatulence. He continues to drink 1 Ensure daily, and he is no longer taking Megace. He continues to take magnesium and creon. He has swellings in his feet. He is eating well.   Overall, he feels ready for next cycle of chemo today.    REVIEW OF SYSTEMS:  Review of Systems  Constitutional:  Positive for fatigue. Negative for appetite change.  Gastrointestinal:  Negative for constipation, diarrhea, nausea and vomiting.  Neurological:  Positive for dizziness.  All other systems reviewed and are negative.  PAST MEDICAL/SURGICAL HISTORY:  Past Medical History:  Diagnosis Date   Anemia, iron deficiency    Diabetes mellitus without complication ([place])    on meds   Hearing loss    Bil hearing aids   Hyperlipidemia    Hypertension    Port-A-Cath in place 12/26/2020   Prediabetes    Sleep apnea    Past Surgical History:  Procedure Laterality Date   BILIARY STENT PLACEMENT N/A 11/09/2020   Procedure: BILIARY STENT PLACEMENT - 75 F X 5 CM STENT;  Surgeon: Rogene Houston, MD;  Location: [facility];  Service: Endoscopy;  Laterality: N/A;   BILIARY STENT PLACEMENT N/A 12/27/2020   Procedure: BILIARY STENT PLACEMENT;  Surgeon: Rogene Houston, MD;  Location: [facility];  Service: Endoscopy;  Laterality: N/A;   COLONOSCOPY     ERCP N/A 11/09/2020   Procedure: ENDOSCOPIC RETROGRADE CHOLANGIOPANCREATOGRAPHY (ERCP);  Surgeon: Rogene Houston, MD;  Location: [facility];  Service: Endoscopy;   Laterality: N/A;   ERCP N/A 12/27/2020   Procedure: ENDOSCOPIC RETROGRADE CHOLANGIOPANCREATOGRAPHY (ERCP);  Surgeon: Rogene Houston, MD;  Location: [facility];  Service: Endoscopy;  Laterality: N/A;   ESOPHAGOGASTRODUODENOSCOPY (EGD) WITH PROPOFOL N/A 11/30/2020   Procedure: ESOPHAGOGASTRODUODENOSCOPY (EGD) WITH PROPOFOL;  Surgeon: Milus Banister, MD;  Location: [facility];  Service: Endoscopy;  Laterality: N/A;   EUS N/A 11/30/2020   Procedure: UPPER ENDOSCOPIC ULTRASOUND (EUS) RADIAL;  Surgeon: Milus Banister, MD;  Location: [facility];  Service: Endoscopy;  Laterality: N/A;   FINE NEEDLE ASPIRATION N/A 11/30/2020   Procedure: FINE NEEDLE ASPIRATION (FNA) LINEAR;  Surgeon: Milus Banister, MD;  Location: [facility];  Service: Endoscopy;  Laterality: N/A;   GIVENS CAPSULE STUDY     IR IMAGING GUIDED PORT INSERTION  12/18/2020   KNEE SURGERY     4 surgeries on left, 2 on right knee   SHOULDER SURGERY     right shoulder   SPHINCTEROTOMY N/A 11/09/2020   Procedure: SHORT BILIARY SPHINCTEROTOMY;  Surgeon: Rogene Houston, MD;  Location: [facility];  Service: Endoscopy;  Laterality: N/A;   WHIPPLE PROCEDURE      SOCIAL HISTORY:  Social History   Socioeconomic History   Marital status: Married    Spouse name: Not on file   Number of children: Not on file   Years of education: Not on file   Highest education level: Not on file  Occupational History   Not on file  Tobacco Use   Smoking status: Every Day    Types: Cigars   Smokeless tobacco: Never   Tobacco comments:    Smoke 3-4 cigars a day  Vaping Use   Vaping Use: Never used  Substance and Sexual Activity   Alcohol use: Yes    Comment: occasional   Drug use: Never   Sexual activity: Not on file  Other Topics Concern   Not on file  Social History Narrative   Not on file   Social Determinants of Health   Financial Resource Strain: Low Risk    Difficulty of Paying Living Expenses: Not hard at all  Food Insecurity:  No Food Insecurity   Worried About Charity fundraiser in the Last Year: Never true   [place] in the Last Year: Never true  Transportation Needs: No Transportation Needs   Lack of Transportation (Medical): No   Lack of Transportation (Non-Medical): No  Physical Activity: Sufficiently Active   Days of Exercise per Week: 5 days   Minutes of Exercise per Session: 30 min  Stress: Not on file  Social Connections: Not on file  Intimate Partner Violence: Not At Risk   Fear of Current or Ex-Partner: No   Emotionally Abused: No   Physically Abused: No   Sexually Abused: No    FAMILY HISTORY:  Family History  Problem Relation Age of Onset   Heart attack Father    Heart attack Paternal Grandfather    Leukemia Sister    Heart attack Brother     CURRENT MEDICATIONS:  Current Outpatient Medications  Medication Sig Dispense Refill  capecitabine (XELODA) 500 MG tablet Take 2 tablets (1,000 mg total) by mouth 2 (two) times daily after a meal. Take for 21 days on, followed by 7 days off. Repeat every 28 days. 84 tablet 0   clobetasol cream (TEMOVATE) 1.76 % Apply 1 application. topically 2 (two) times daily. 60 g 3   Continuous Blood Gluc Sensor (FREESTYLE LIBRE 3 SENSOR) MISC 1 Piece by Does not apply route every 14 (fourteen) days. Place 1 sensor on the skin every 14 days. Use to check glucose continuously 6 each 1   diphenoxylate-atropine (LOMOTIL) 2.5-0.025 MG tablet Take 1 tablet by mouth 4 (four) times daily as needed for diarrhea or loose stools. 60 tablet 0   Glucosamine-Chondroitin (OSTEO BI-FLEX REGULAR STRENGTH PO) Take 2 tablets by mouth daily.     insulin detemir (LEVEMIR) 100 UNIT/ML FlexPen Inject 8 Units into the skin at bedtime. 15 mL 1   insulin lispro (HUMALOG KWIKPEN) 100 UNIT/ML KwikPen Inject 4-7 Units into the skin 3 (three) times daily before meals. 15 mL 1   IRON-VITAMIN C PO Take 1 tablet by mouth daily.     levothyroxine (SYNTHROID) 175 MCG tablet TAKE 1 TABLET  DAILY 90 tablet 1   levothyroxine (SYNTHROID) 25 MCG tablet Take one tablet po daily but on Mon,Wed,and Friday take 2 135 tablet 1   lipase/protease/amylase (CREON) 36000 UNITS CPEP capsule Take 2 capsules (72,000 Units total) by mouth 3 (three) times daily with meals. May also take 1 capsule (36,000 Units total) as needed (with snacks). 240 capsule 11   oxyCODONE-acetaminophen (PERCOCET/ROXICET) 5-325 MG tablet Take 1 tablet by mouth every 6 (six) hours as needed for severe pain. 20 tablet 0   potassium chloride SA (KLOR-CON M) 20 MEQ tablet Take 1 tablet (20 mEq total) by mouth daily. 60 tablet 0   sildenafil (VIAGRA) 100 MG tablet 50 mg as needed.     triamcinolone cream (KENALOG) 0.1 % Apply to affected areas BID prn 30 g 3   No current facility-administered medications for this visit.    ALLERGIES:  Allergies  Allergen Reactions   Lisinopril Cough    PHYSICAL EXAM:  Performance status (ECOG): 1 - Symptomatic but completely ambulatory  There were no vitals filed for this visit. Wt Readings from Last 3 Encounters:  10/16/21 180 lb 12.8 oz (82 kg)  10/10/21 186 lb 3.2 oz (84.5 kg)  10/01/21 181 lb 3.5 oz (82.2 kg)   Physical Exam Vitals reviewed.  Constitutional:      Appearance: Normal appearance.  HENT:     Mouth/Throat:     Mouth: No oral lesions.  Cardiovascular:     Rate and Rhythm: Normal rate and regular rhythm.     Pulses: Normal pulses.     Heart sounds: Normal heart sounds.  Pulmonary:     Effort: Pulmonary effort is normal.     Breath sounds: Normal breath sounds.  Musculoskeletal:     Right lower leg: 1+ Edema present.     Left lower leg: 1+ Edema present.  Neurological:     General: No focal deficit present.     Mental Status: He is alert and oriented to person, place, and time.  Psychiatric:        Mood and Affect: Mood normal.        Behavior: Behavior normal.    LABORATORY DATA:  I have reviewed the labs as listed.     Latest Ref Rng & Units  10/16/2021   11:56 AM 10/01/2021  12:19 PM 09/24/2021   11:53 AM  CBC  WBC 4.0 - 10.5 K/uL 5.8   3.7   3.4    Hemoglobin 13.0 - 17.0 g/dL 10.0   8.3   8.6    Hematocrit 39.0 - 52.0 % 31.0   25.6   26.5    Platelets 150 - 400 K/uL 363   219   266        Latest Ref Rng & Units 10/16/2021   11:56 AM 10/01/2021   12:19 PM 09/24/2021   11:53 AM  CMP  Glucose 70 - 99 mg/dL 134   171   123    BUN 8 - 23 mg/dL 12   12   15     Creatinine 0.61 - 1.24 mg/dL 0.89   0.81   0.92    Sodium 135 - 145 mmol/L 137   140   139    Potassium 3.5 - 5.1 mmol/L 4.5   3.2   3.9    Chloride 98 - 111 mmol/L 109   112   107    CO2 22 - 32 mmol/L 25   23   27     Calcium 8.9 - 10.3 mg/dL 7.8   8.2   8.1    Total Protein 6.5 - 8.1 g/dL 5.4   6.0   5.7    Total Bilirubin 0.3 - 1.2 mg/dL 0.6   0.5   0.7    Alkaline Phos 38 - 126 U/L 150   106   102    AST 15 - 41 U/L 28   21   25     ALT 0 - 44 U/L 22   22   24       DIAGNOSTIC IMAGING:  I have independently reviewed the scans and discussed with the patient. No results found.   ASSESSMENT:  1.  Stage I (T2N0) pancreatic adenocarcinoma: - Recent admission to the hospital with DKA, found to have elevated liver enzymes. - MRCP without contrast on 11/08/2020 showed 2.3 x 2 cm central pancreatic head and uncinate mass with intra and extrahepatic biliary ductal dilation. - 11/09/2020-ERCP sphincterotomy and stenting with findings showing short high-grade distal CBD stricture with dilated CBD up to 14 mm.  Bile duct brushing was negative for malignant cells. - 11/10/2020-CA 19.9-367. - ERCP/EUS by [name] on 11/30/2020 with 2.9 cm mass in the head of the pancreas with unclear outer borders.  Given significant dilatation, indistinct borders, unable to confidently assess major vascular involvement.  Abutment and probable invasion of SMV and portal vein near the confluence with splenic vein.  No obvious SMA or celiac artery involvement.  No other adenopathy. - FNA of the  pancreatic mass consistent with adenocarcinoma. - Weight loss intentional 25 pounds between January and April 1.  Additional 25 pound weight loss since 1 April, unintentional. - CT [facility] pancreatic protocol on 12/05/2020 with ill-defined mass in the pancreatic head and uncinate process measuring up to 1.9 x 2.1 cm with associated pancreatic ductal dilatation up to 8 mm.  No enlarged adenopathy.  Teardrop morphology of SMV indicative of SMV involvement.  Replaced right hepatic arterial supply arises from the SMA passing immediately posterior to the tumor. - PET scan on 12/14/2020 with mass in the pancreatic head with hypermetabolic features, SUV 5.3 with no sign of metastatic disease. - 5 cycles of neoadjuvant FOLFIRINOX from 01/10/2021 through 03/14/2021. - Whipple's procedure with IRE by Dr.Ianniti on 05/15/2021. - Pathology consistent with grade 2 moderate adenocarcinoma, 3.7  cm, absent treatment effect, no evidence of tumor regression, margins positive, 6/36 nodes positive, [id number]. - Adjuvant chemotherapy for 4 months with gemcitabine +capecitabine started on 07/23/2021.  This will be followed by chemoradiation therapy with Xeloda for positive margins (R1 resection). - Cycle 1 of gemcitabine and capecitabine started on 07/23/2021.  2.  Social/family history: - He lives at home with his wife and is independent of all ADLs and IADLs. - He currently works in [employer].  No history of chemical exposure. - He previously smoked cigarettes.  Currently smokes cigars 4/day. - Maternal grandmother died at age 48 with malignancy, unknown type. - Sister died of acute leukemia.   PLAN:  1.  YpT2pN2 pancreatic adenocarcinoma: - He has tolerated cycle 3 with gemcitabine dose reduction 750 mg/m2.  We have also dose reduced Xeloda to 2 tablets twice daily 3 weeks on/1 week off.  He did not have any hand-foot skin reaction.  He had some skin peeling while he was using urea cream. - He stopped taking Xeloda for the last 2  and half days after last cycle due to fatigue. - Reviewed labs today which showed alkaline phosphatase elevated at 150.  Rest of the LFTs are normal.  Albumin is low at 2.4.  Creatinine is normal.  CBC was grossly normal with macrocytic anemia from myelosuppression. - He will proceed with cycle 4 today with same doses of gemcitabine.  He started Xeloda 2 tablets twice daily on 10/15/2021. - Recommend proceeding with cycle 4 today.  RTC 4 weeks for follow-up with repeat labs.  - We discussed the possibility of giving chemotherapy for 6 months as his neoadjuvant chemo for 5 cycles did not result in any benefit.  RTC 4 weeks for follow-up with repeat labs and treatment.  2.  Weight loss: - He lost about 1 pound from last visit.  He is eating better and drinking 1 Ensure per day.  He is not taking any appetite stimulant.  3.  Hypomagnesemia: - Continue magnesium 1 tablet daily.  Magnesium today is 1.8.  4.  Pancreatic insufficiency: - Continue Creon 2 capsules with meals and 1 capsule with snacks.   Orders placed this encounter:  No orders of the defined types were placed in this encounter.    Derek Jack, MD [place] [phone]   I, Thana Ates, am acting as a scribe for Dr. Derek Jack.  I, Derek Jack MD, have reviewed the above documentation for accuracy and completeness, and I agree with the above.

## 2021-10-16 NOTE — Progress Notes (Signed)
Patient has been examined by Dr. Katragadda, and vital signs and labs have been reviewed. ANC, Creatinine, LFTs, hemoglobin, and platelets are within treatment parameters per M.D. - pt may proceed with treatment.    °

## 2021-10-16 NOTE — Progress Notes (Signed)
Patient presents today for treatment and follow up visit with [name]. Calcium 7.8. Albumin 2.4. Labs within parameters for treatment. Vital signs within parameters for treatment.   Verbal received from A. Ouida Sills RN / [name] to proceed with treatment.   Treatment given today per MD orders. Tolerated infusion without adverse affects. Vital signs stable. No complaints at this time. Discharged from clinic ambulatory in stable condition. Alert and oriented x 3. F/U with [facility] as scheduled.

## 2021-10-17 ENCOUNTER — Other Ambulatory Visit: Payer: Self-pay | Admitting: Family Medicine

## 2021-10-17 LAB — CANCER ANTIGEN 19-9: CA 19-9: 178 U/mL — ABNORMAL HIGH (ref 0–35)

## 2021-10-19 ENCOUNTER — Telehealth: Payer: Self-pay | Admitting: Family Medicine

## 2021-10-19 ENCOUNTER — Encounter (HOSPITAL_COMMUNITY): Payer: Self-pay

## 2021-10-19 DIAGNOSIS — E039 Hypothyroidism, unspecified: Secondary | ICD-10-CM | POA: Diagnosis not present

## 2021-10-19 NOTE — Telephone Encounter (Signed)
Message per provider regarding speaking to [place]. please advise. Thank you

## 2021-10-19 NOTE — Progress Notes (Signed)
Patient's wife called stating that patient has bursitis in his heel and is wanting to know what they need to do. Patient's wife made aware that if he is having peeling, redness or soreness of the skin that he should discontinue the Xeloda. She is aware that otherwise he needs to see his primary care or urgent care concerning his heel pain.

## 2021-10-19 NOTE — Telephone Encounter (Signed)
Staff message sent to his oncologist regarding having discussion with the wife about some particular matters

## 2021-10-19 NOTE — Telephone Encounter (Signed)
Message per provider regarding speaking to [place]. Please advise. Thank you.

## 2021-10-20 LAB — T4, FREE: Free T4: 1.23 ng/dL (ref 0.82–1.77)

## 2021-10-20 LAB — TSH: TSH: 9.98 u[IU]/mL — ABNORMAL HIGH (ref 0.450–4.500)

## 2021-10-22 ENCOUNTER — Telehealth: Payer: Self-pay | Admitting: "Endocrinology

## 2021-10-22 ENCOUNTER — Other Ambulatory Visit (HOSPITAL_COMMUNITY): Payer: Self-pay

## 2021-10-22 ENCOUNTER — Other Ambulatory Visit: Payer: Self-pay

## 2021-10-22 DIAGNOSIS — C259 Malignant neoplasm of pancreas, unspecified: Secondary | ICD-10-CM

## 2021-10-22 MED ORDER — FREESTYLE LIBRE 3 SENSOR MISC: 1.0000 | 1 refills | Status: DC

## 2021-10-22 NOTE — Telephone Encounter (Signed)
Rx Sent  

## 2021-10-22 NOTE — Telephone Encounter (Signed)
Patient would like his [employer] 3 sent to [facility] on [address]

## 2021-10-22 NOTE — Progress Notes (Signed)
CT CAP order placed per Dr. Katragadda 

## 2021-10-23 ENCOUNTER — Inpatient Hospital Stay (HOSPITAL_COMMUNITY): Payer: BC Managed Care – PPO

## 2021-10-23 ENCOUNTER — Other Ambulatory Visit: Payer: Self-pay | Admitting: *Deleted

## 2021-10-23 ENCOUNTER — Inpatient Hospital Stay (HOSPITAL_COMMUNITY): Payer: BC Managed Care – PPO | Attending: Hematology

## 2021-10-23 VITALS — Temp 98.1°F

## 2021-10-23 DIAGNOSIS — E039 Hypothyroidism, unspecified: Secondary | ICD-10-CM

## 2021-10-23 DIAGNOSIS — C250 Malignant neoplasm of head of pancreas: Secondary | ICD-10-CM | POA: Insufficient documentation

## 2021-10-23 DIAGNOSIS — R300 Dysuria: Secondary | ICD-10-CM | POA: Diagnosis not present

## 2021-10-23 DIAGNOSIS — C259 Malignant neoplasm of pancreas, unspecified: Secondary | ICD-10-CM

## 2021-10-23 DIAGNOSIS — Z9049 Acquired absence of other specified parts of digestive tract: Secondary | ICD-10-CM | POA: Diagnosis not present

## 2021-10-23 DIAGNOSIS — Z79899 Other long term (current) drug therapy: Secondary | ICD-10-CM | POA: Diagnosis not present

## 2021-10-23 DIAGNOSIS — Z5111 Encounter for antineoplastic chemotherapy: Secondary | ICD-10-CM | POA: Diagnosis not present

## 2021-10-23 DIAGNOSIS — M2550 Pain in unspecified joint: Secondary | ICD-10-CM | POA: Insufficient documentation

## 2021-10-23 DIAGNOSIS — Z79890 Hormone replacement therapy (postmenopausal): Secondary | ICD-10-CM | POA: Diagnosis not present

## 2021-10-23 DIAGNOSIS — E785 Hyperlipidemia, unspecified: Secondary | ICD-10-CM | POA: Diagnosis not present

## 2021-10-23 DIAGNOSIS — Z806 Family history of leukemia: Secondary | ICD-10-CM | POA: Insufficient documentation

## 2021-10-23 DIAGNOSIS — F17290 Nicotine dependence, other tobacco product, uncomplicated: Secondary | ICD-10-CM | POA: Insufficient documentation

## 2021-10-23 DIAGNOSIS — R634 Abnormal weight loss: Secondary | ICD-10-CM | POA: Insufficient documentation

## 2021-10-23 DIAGNOSIS — Z95828 Presence of other vascular implants and grafts: Secondary | ICD-10-CM

## 2021-10-23 DIAGNOSIS — M25473 Effusion, unspecified ankle: Secondary | ICD-10-CM | POA: Diagnosis not present

## 2021-10-23 DIAGNOSIS — E119 Type 2 diabetes mellitus without complications: Secondary | ICD-10-CM | POA: Insufficient documentation

## 2021-10-23 DIAGNOSIS — R5383 Other fatigue: Secondary | ICD-10-CM | POA: Diagnosis not present

## 2021-10-23 DIAGNOSIS — Z794 Long term (current) use of insulin: Secondary | ICD-10-CM | POA: Insufficient documentation

## 2021-10-23 DIAGNOSIS — Z888 Allergy status to other drugs, medicaments and biological substances status: Secondary | ICD-10-CM | POA: Diagnosis not present

## 2021-10-23 DIAGNOSIS — Z8249 Family history of ischemic heart disease and other diseases of the circulatory system: Secondary | ICD-10-CM | POA: Diagnosis not present

## 2021-10-23 DIAGNOSIS — R200 Anesthesia of skin: Secondary | ICD-10-CM | POA: Insufficient documentation

## 2021-10-23 DIAGNOSIS — R42 Dizziness and giddiness: Secondary | ICD-10-CM | POA: Diagnosis not present

## 2021-10-23 LAB — CBC WITH DIFFERENTIAL/PLATELET
Abs Immature Granulocytes: 0.02 10*3/uL (ref 0.00–0.07)
Basophils Absolute: 0 10*3/uL (ref 0.0–0.1)
Basophils Relative: 2 %
Eosinophils Absolute: 0 10*3/uL (ref 0.0–0.5)
Eosinophils Relative: 0 %
HCT: 29.3 % — ABNORMAL LOW (ref 39.0–52.0)
Hemoglobin: 9.4 g/dL — ABNORMAL LOW (ref 13.0–17.0)
Immature Granulocytes: 1 %
Lymphocytes Relative: 56 %
Lymphs Abs: 1.4 10*3/uL (ref 0.7–4.0)
MCH: 35.7 pg — ABNORMAL HIGH (ref 26.0–34.0)
MCHC: 32.1 g/dL (ref 30.0–36.0)
MCV: 111.4 fL — ABNORMAL HIGH (ref 80.0–100.0)
Monocytes Absolute: 0.4 10*3/uL (ref 0.1–1.0)
Monocytes Relative: 16 %
Neutro Abs: 0.6 10*3/uL — ABNORMAL LOW (ref 1.7–7.7)
Neutrophils Relative %: 25 %
Platelets: 236 10*3/uL (ref 150–400)
RBC: 2.63 MIL/uL — ABNORMAL LOW (ref 4.22–5.81)
RDW: 19.5 % — ABNORMAL HIGH (ref 11.5–15.5)
WBC: 2.5 10*3/uL — ABNORMAL LOW (ref 4.0–10.5)
nRBC: 0 % (ref 0.0–0.2)

## 2021-10-23 LAB — COMPREHENSIVE METABOLIC PANEL
ALT: 18 U/L (ref 0–44)
AST: 23 U/L (ref 15–41)
Albumin: 2.5 g/dL — ABNORMAL LOW (ref 3.5–5.0)
Alkaline Phosphatase: 126 U/L (ref 38–126)
Anion gap: 8 (ref 5–15)
BUN: 11 mg/dL (ref 8–23)
CO2: 24 mmol/L (ref 22–32)
Calcium: 8.2 mg/dL — ABNORMAL LOW (ref 8.9–10.3)
Chloride: 108 mmol/L (ref 98–111)
Creatinine, Ser: 0.9 mg/dL (ref 0.61–1.24)
GFR, Estimated: >60 mL/min (ref >60)
Glucose, Bld: 152 mg/dL — ABNORMAL HIGH (ref 70–99)
Potassium: 4 mmol/L (ref 3.5–5.1)
Sodium: 140 mmol/L (ref 135–145)
Total Bilirubin: 0.7 mg/dL (ref 0.3–1.2)
Total Protein: 5.7 g/dL — ABNORMAL LOW (ref 6.5–8.1)

## 2021-10-23 LAB — MAGNESIUM: Magnesium: 1.7 mg/dL (ref 1.7–2.4)

## 2021-10-23 MED ORDER — HEPARIN SOD (PORK) LOCK FLUSH 100 UNIT/ML IV SOLN
500.0000 [IU] | Freq: Once | INTRAVENOUS | Status: AC
  Administered 2021-10-23: 500 [IU] via INTRAVENOUS

## 2021-10-23 MED ORDER — LEVOTHYROXINE SODIUM 25 MCG PO TABS: ORAL_TABLET | ORAL | 1 refills | Status: DC

## 2021-10-23 MED ORDER — SODIUM CHLORIDE 0.9% FLUSH
10.0000 mL | Freq: Once | INTRAVENOUS | Status: AC
  Administered 2021-10-23: 10 mL via INTRAVENOUS

## 2021-10-23 NOTE — Progress Notes (Signed)
Patients port flushed without difficulty.  Good blood return noted with no bruising or swelling noted at site.  Stable during access and blood draw.  Patient to remain accessed for treatment. 

## 2021-10-23 NOTE — Progress Notes (Signed)
Pt presents today for treatment per provider's order. Pt's ANC was 0.6 today. No treatment today per Dr.K. Pt will return next for labs and treatment. Pt made aware and verbalized understanding.

## 2021-10-25 NOTE — Telephone Encounter (Signed)
I did communicate with [name].  [name] did have a conversation with wife.  CAT scan scheduled.

## 2021-10-26 ENCOUNTER — Other Ambulatory Visit (HOSPITAL_COMMUNITY): Payer: Self-pay

## 2021-10-30 ENCOUNTER — Encounter (HOSPITAL_COMMUNITY): Payer: Self-pay

## 2021-10-30 ENCOUNTER — Inpatient Hospital Stay (HOSPITAL_COMMUNITY): Payer: BC Managed Care – PPO

## 2021-10-30 VITALS — BP 98/66 | HR 79 | Temp 98.0°F | Resp 18 | Wt 177.6 lb

## 2021-10-30 DIAGNOSIS — E785 Hyperlipidemia, unspecified: Secondary | ICD-10-CM | POA: Diagnosis not present

## 2021-10-30 DIAGNOSIS — C250 Malignant neoplasm of head of pancreas: Secondary | ICD-10-CM | POA: Diagnosis not present

## 2021-10-30 DIAGNOSIS — F17290 Nicotine dependence, other tobacco product, uncomplicated: Secondary | ICD-10-CM | POA: Diagnosis not present

## 2021-10-30 DIAGNOSIS — Z79890 Hormone replacement therapy (postmenopausal): Secondary | ICD-10-CM | POA: Diagnosis not present

## 2021-10-30 DIAGNOSIS — R5383 Other fatigue: Secondary | ICD-10-CM | POA: Diagnosis not present

## 2021-10-30 DIAGNOSIS — M2550 Pain in unspecified joint: Secondary | ICD-10-CM | POA: Diagnosis not present

## 2021-10-30 DIAGNOSIS — C259 Malignant neoplasm of pancreas, unspecified: Secondary | ICD-10-CM

## 2021-10-30 DIAGNOSIS — R634 Abnormal weight loss: Secondary | ICD-10-CM | POA: Diagnosis not present

## 2021-10-30 DIAGNOSIS — Z95828 Presence of other vascular implants and grafts: Secondary | ICD-10-CM

## 2021-10-30 DIAGNOSIS — R42 Dizziness and giddiness: Secondary | ICD-10-CM | POA: Diagnosis not present

## 2021-10-30 DIAGNOSIS — E119 Type 2 diabetes mellitus without complications: Secondary | ICD-10-CM | POA: Diagnosis not present

## 2021-10-30 DIAGNOSIS — R200 Anesthesia of skin: Secondary | ICD-10-CM | POA: Diagnosis not present

## 2021-10-30 DIAGNOSIS — Z794 Long term (current) use of insulin: Secondary | ICD-10-CM | POA: Diagnosis not present

## 2021-10-30 DIAGNOSIS — Z79899 Other long term (current) drug therapy: Secondary | ICD-10-CM | POA: Diagnosis not present

## 2021-10-30 DIAGNOSIS — M25473 Effusion, unspecified ankle: Secondary | ICD-10-CM | POA: Diagnosis not present

## 2021-10-30 DIAGNOSIS — R300 Dysuria: Secondary | ICD-10-CM | POA: Diagnosis not present

## 2021-10-30 DIAGNOSIS — Z5111 Encounter for antineoplastic chemotherapy: Secondary | ICD-10-CM | POA: Diagnosis not present

## 2021-10-30 LAB — COMPREHENSIVE METABOLIC PANEL
ALT: 16 U/L (ref 0–44)
AST: 21 U/L (ref 15–41)
Albumin: 2.4 g/dL — ABNORMAL LOW (ref 3.5–5.0)
Alkaline Phosphatase: 123 U/L (ref 38–126)
Anion gap: 4 — ABNORMAL LOW (ref 5–15)
BUN: 12 mg/dL (ref 8–23)
CO2: 25 mmol/L (ref 22–32)
Calcium: 7.8 mg/dL — ABNORMAL LOW (ref 8.9–10.3)
Chloride: 110 mmol/L (ref 98–111)
Creatinine, Ser: 0.86 mg/dL (ref 0.61–1.24)
GFR, Estimated: >60 mL/min (ref >60)
Glucose, Bld: 147 mg/dL — ABNORMAL HIGH (ref 70–99)
Potassium: 4.4 mmol/L (ref 3.5–5.1)
Sodium: 139 mmol/L (ref 135–145)
Total Bilirubin: 0.6 mg/dL (ref 0.3–1.2)
Total Protein: 5.7 g/dL — ABNORMAL LOW (ref 6.5–8.1)

## 2021-10-30 LAB — CBC WITH DIFFERENTIAL/PLATELET
Abs Immature Granulocytes: 0.01 10*3/uL (ref 0.00–0.07)
Basophils Absolute: 0 10*3/uL (ref 0.0–0.1)
Basophils Relative: 1 %
Eosinophils Absolute: 0 10*3/uL (ref 0.0–0.5)
Eosinophils Relative: 1 %
HCT: 29.7 % — ABNORMAL LOW (ref 39.0–52.0)
Hemoglobin: 9.8 g/dL — ABNORMAL LOW (ref 13.0–17.0)
Immature Granulocytes: 0 %
Lymphocytes Relative: 38 %
Lymphs Abs: 1.8 10*3/uL (ref 0.7–4.0)
MCH: 36.7 pg — ABNORMAL HIGH (ref 26.0–34.0)
MCHC: 33 g/dL (ref 30.0–36.0)
MCV: 111.2 fL — ABNORMAL HIGH (ref 80.0–100.0)
Monocytes Absolute: 0.9 10*3/uL (ref 0.1–1.0)
Monocytes Relative: 19 %
Neutro Abs: 2 10*3/uL (ref 1.7–7.7)
Neutrophils Relative %: 41 %
Platelets: 330 10*3/uL (ref 150–400)
RBC: 2.67 MIL/uL — ABNORMAL LOW (ref 4.22–5.81)
RDW: 22.3 % — ABNORMAL HIGH (ref 11.5–15.5)
WBC: 4.8 10*3/uL (ref 4.0–10.5)
nRBC: 0 % (ref 0.0–0.2)

## 2021-10-30 LAB — MAGNESIUM: Magnesium: 1.7 mg/dL (ref 1.7–2.4)

## 2021-10-30 MED ORDER — SODIUM CHLORIDE 0.9 % IV SOLN
8.0000 mg | Freq: Once | INTRAVENOUS | Status: AC
  Administered 2021-10-30: 8 mg via INTRAVENOUS
  Filled 2021-10-30: qty 4

## 2021-10-30 MED ORDER — SODIUM CHLORIDE 0.9 % IV SOLN
750.0000 mg/m2 | Freq: Once | INTRAVENOUS | Status: AC
  Administered 2021-10-30: 1558 mg via INTRAVENOUS
  Filled 2021-10-30: qty 40.98

## 2021-10-30 MED ORDER — SODIUM CHLORIDE 0.9 % IV SOLN: Freq: Once | INTRAVENOUS | Status: AC

## 2021-10-30 MED ORDER — SODIUM CHLORIDE 0.9% FLUSH
10.0000 mL | INTRAVENOUS | Status: DC | PRN
  Administered 2021-10-30: 10 mL

## 2021-10-30 MED ORDER — HEPARIN SOD (PORK) LOCK FLUSH 100 UNIT/ML IV SOLN
500.0000 [IU] | Freq: Once | INTRAVENOUS | Status: AC | PRN
  Administered 2021-10-30: 500 [IU]

## 2021-10-30 NOTE — Patient Instructions (Signed)
[facility]  Discharge Instructions: Thank you for choosing [facility] to provide your oncology and hematology care.  If you have a lab appointment with the Cancer Center, please come in thru the Main Entrance and check in at the main information desk.  Wear comfortable clothing and clothing appropriate for easy access to any Portacath or PICC line.   We strive to give you quality time with your provider. You may need to reschedule your appointment if you arrive late (15 or more minutes).  Arriving late affects you and other patients whose appointments are after yours.  Also, if you miss three or more appointments without notifying the office, you may be dismissed from the clinic at the provider's discretion.      For prescription refill requests, have your pharmacy contact our office and allow 72 hours for refills to be completed.    Today you received the following chemotherapy and/or immunotherapy agents Gemzar.  Gemcitabine injection What is this medication? GEMCITABINE (jem SYE ta been) is a chemotherapy drug. This medicine is used to treat many types of cancer like breast cancer, lung cancer, pancreatic cancer, and ovarian cancer. This medicine may be used for other purposes; ask your health care provider or pharmacist if you have questions. COMMON BRAND NAME(S): Gemzar, Infugem What should I tell my care team before I take this medication? They need to know if you have any of these conditions: blood disorders infection kidney disease liver disease lung or breathing disease, like asthma recent or ongoing radiation therapy an unusual or allergic reaction to gemcitabine, other chemotherapy, other medicines, foods, dyes, or preservatives pregnant or trying to get pregnant breast-feeding How should I use this medication? This drug is given as an infusion into a vein. It is administered in a hospital or clinic by a specially trained health care  professional. Talk to your pediatrician regarding the use of this medicine in children. Special care may be needed. Overdosage: If you think you have taken too much of this medicine contact a poison control center or emergency room at once. NOTE: This medicine is only for you. Do not share this medicine with others. What if I miss a dose? It is important not to miss your dose. Call your doctor or health care professional if you are unable to keep an appointment. What may interact with this medication? medicines to increase blood counts like filgrastim, pegfilgrastim, sargramostim some other chemotherapy drugs like cisplatin vaccines Talk to your doctor or health care professional before taking any of these medicines: acetaminophen aspirin ibuprofen ketoprofen naproxen This list may not describe all possible interactions. Give your health care provider a list of all the medicines, herbs, non-prescription drugs, or dietary supplements you use. Also tell them if you smoke, drink alcohol, or use illegal drugs. Some items may interact with your medicine. What should I watch for while using this medication? Visit your doctor for checks on your progress. This drug may make you feel generally unwell. This is not uncommon, as chemotherapy can affect healthy cells as well as cancer cells. Report any side effects. Continue your course of treatment even though you feel ill unless your doctor tells you to stop. In some cases, you may be given additional medicines to help with side effects. Follow all directions for their use. Call your doctor or health care professional for advice if you get a fever, chills or sore throat, or other symptoms of a cold or flu. Do not treat yourself.   This drug decreases your body's ability to fight infections. Try to avoid being around people who are sick. This medicine may increase your risk to bruise or bleed. Call your doctor or health care professional if you notice any  unusual bleeding. Be careful brushing and flossing your teeth or using a toothpick because you may get an infection or bleed more easily. If you have any dental work done, tell your dentist you are receiving this medicine. Avoid taking products that contain aspirin, acetaminophen, ibuprofen, naproxen, or ketoprofen unless instructed by your doctor. These medicines may hide a fever. Do not become pregnant while taking this medicine or for 6 months after stopping it. Women should inform their doctor if they wish to become pregnant or think they might be pregnant. Men should not father a child while taking this medicine and for 3 months after stopping it. There is a potential for serious side effects to an unborn child. Talk to your health care professional or pharmacist for more information. Do not breast-feed an infant while taking this medicine or for at least 1 week after stopping it. Men should inform their doctors if they wish to father a child. This medicine may lower sperm counts. Talk with your doctor or health care professional if you are concerned about your fertility. What side effects may I notice from receiving this medication? Side effects that you should report to your doctor or health care professional as soon as possible: allergic reactions like skin rash, itching or hives, swelling of the face, lips, or tongue breathing problems pain, redness, or irritation at site where injected signs and symptoms of a dangerous change in heartbeat or heart rhythm like chest pain; dizziness; fast or irregular heartbeat; palpitations; feeling faint or lightheaded, falls; breathing problems signs of decreased platelets or bleeding - bruising, pinpoint red spots on the skin, black, tarry stools, blood in the urine signs of decreased red blood cells - unusually weak or tired, feeling faint or lightheaded, falls signs of infection - fever or chills, cough, sore throat, pain or difficulty passing urine signs  and symptoms of kidney injury like trouble passing urine or change in the amount of urine signs and symptoms of liver injury like dark yellow or brown urine; general ill feeling or flu-like symptoms; light-colored stools; loss of appetite; nausea; right upper belly pain; unusually weak or tired; yellowing of the eyes or skin swelling of ankles, feet, hands Side effects that usually do not require medical attention (report to your doctor or health care professional if they continue or are bothersome): constipation diarrhea hair loss loss of appetite nausea rash vomiting This list may not describe all possible side effects. Call your doctor for medical advice about side effects. You may report side effects to FDA at 1-800-FDA-1088. Where should I keep my medication? This drug is given in a hospital or clinic and will not be stored at home. NOTE: This sheet is a summary. It may not cover all possible information. If you have questions about this medicine, talk to your doctor, pharmacist, or health care provider.  2023 Elsevier/Gold Standard (2017-07-30 00:00:00)       To help prevent nausea and vomiting after your treatment, we encourage you to take your nausea medication as directed.  BELOW ARE SYMPTOMS THAT SHOULD BE REPORTED IMMEDIATELY: *FEVER GREATER THAN 100.4 F (38 C) OR HIGHER *CHILLS OR SWEATING *NAUSEA AND VOMITING THAT IS NOT CONTROLLED WITH YOUR NAUSEA MEDICATION *UNUSUAL SHORTNESS OF BREATH *UNUSUAL BRUISING OR BLEEDING *URINARY PROBLEMS (  pain or burning when urinating, or frequent urination) *BOWEL PROBLEMS (unusual diarrhea, constipation, pain near the anus) TENDERNESS IN MOUTH AND THROAT WITH OR WITHOUT PRESENCE OF ULCERS (sore throat, sores in mouth, or a toothache) UNUSUAL RASH, SWELLING OR PAIN  UNUSUAL VAGINAL DISCHARGE OR ITCHING   Items with * indicate a potential emergency and should be followed up as soon as possible or go to the Emergency Department if any  problems should occur.  Please show the CHEMOTHERAPY ALERT CARD or IMMUNOTHERAPY ALERT CARD at check-in to the Emergency Department and triage nurse.  Should you have questions after your visit or need to cancel or reschedule your appointment, please contact [facility] [phone]  and follow the prompts.  Office hours are 8:00 a.m. to 4:30 p.m. Monday - Friday. Please note that voicemails left after 4:00 p.m. may not be returned until the following business day.  We are closed weekends and major holidays. You have access to a nurse at all times for urgent questions. Please call the main number to the clinic [phone] and follow the prompts.  For any non-urgent questions, you may also contact your provider using MyChart. We now offer e-Visits for anyone 18 and older to request care online for non-urgent symptoms. For details visit [email or web address].   Also download the MyChart app! Go to the app store, search "MyChart", open the app, select [facility], and log in with your MyChart username and password.  Masks are optional in the cancer centers. If you would like for your care team to wear a mask while they are taking care of you, please let them know. For doctor visits, patients may have with them one support person who is at least 64 years old. At this time, visitors are not allowed in the infusion area.  

## 2021-10-30 NOTE — Progress Notes (Signed)
Patients port flushed without difficulty.  Good blood return noted with no bruising or swelling noted at site.  Stable during access and blood draw.  Patient to remain accessed for treatment. 

## 2021-10-30 NOTE — Progress Notes (Signed)
Patient presents today for treatment. MAR reviewed and updated. Vital signs within parameters for treatment. Labs within parameters for treatment. Patient denies pain today. Patient states he has a new onset of his feet feeling sore. Patient's words. Patient states," It feels like when your feet get cold and ache."   Treatment given today per MD orders. Tolerated infusion without adverse affects. Vital signs stable. No complaints at this time. Discharged from clinic ambulatory in stable condition. Alert and oriented x 3. F/U with [facility] as scheduled.

## 2021-11-01 ENCOUNTER — Encounter (HOSPITAL_COMMUNITY): Payer: Self-pay

## 2021-11-08 ENCOUNTER — Encounter (HOSPITAL_BASED_OUTPATIENT_CLINIC_OR_DEPARTMENT_OTHER): Payer: Self-pay

## 2021-11-08 ENCOUNTER — Ambulatory Visit (HOSPITAL_BASED_OUTPATIENT_CLINIC_OR_DEPARTMENT_OTHER)
Admission: RE | Admit: 2021-11-08 | Discharge: 2021-11-08 | Disposition: A | Discharge status: Home / Self Care | Payer: BC Managed Care – PPO | Source: Ambulatory Visit | Attending: Hematology | Admitting: Hematology

## 2021-11-08 DIAGNOSIS — M170 Bilateral primary osteoarthritis of knee: Secondary | ICD-10-CM | POA: Insufficient documentation

## 2021-11-08 DIAGNOSIS — C259 Malignant neoplasm of pancreas, unspecified: Secondary | ICD-10-CM | POA: Diagnosis not present

## 2021-11-08 DIAGNOSIS — K838 Other specified diseases of biliary tract: Secondary | ICD-10-CM | POA: Diagnosis not present

## 2021-11-08 IMAGING — CT CT CHEST-ABD-PELV W/ CM
2 of 5 series · 13 of 46 positions shown, 15 images · IV contrast (APPLIED)
Comparison: [DATE] CT chest, abdomen and pelvis.

CLINICAL DATA: Pancreatic cancer with history of Whipple procedure,
on chemotherapy break. Restaging.

* Tracking Code: BO *
EXAM:
CT CHEST, ABDOMEN, AND PELVIS WITH CONTRAST
TECHNIQUE: Multidetector CT imaging of the chest, abdomen and pelvis was
performed following the standard protocol during bolus
administration of intravenous contrast.

[Series 2: cap with · axial · 0.82mm/px · z∈[+791,+1396]mm · 10 of 143 slices shown, 12 images]
[im 11/143  soft-tissue]
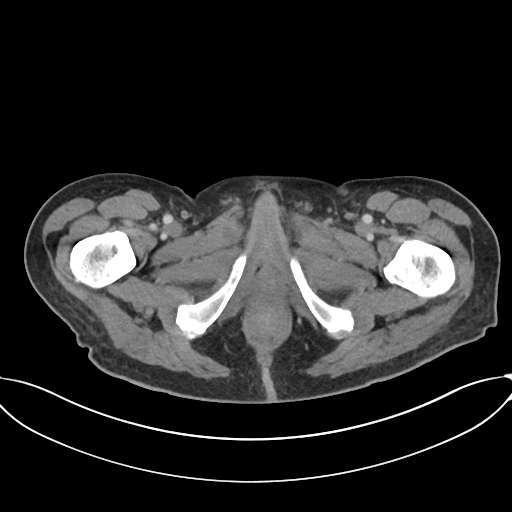
[im 11/143  bone]
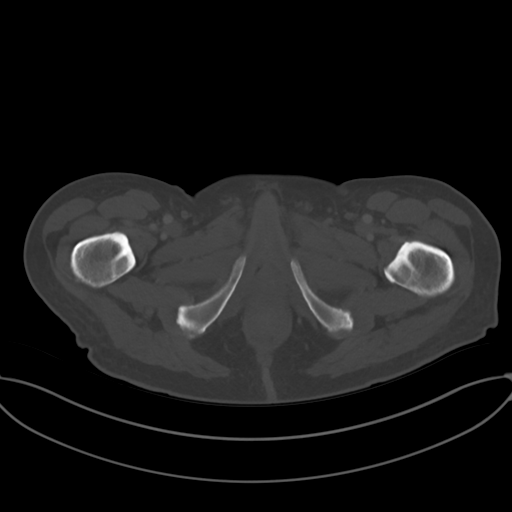
[im 22/143  soft-tissue]
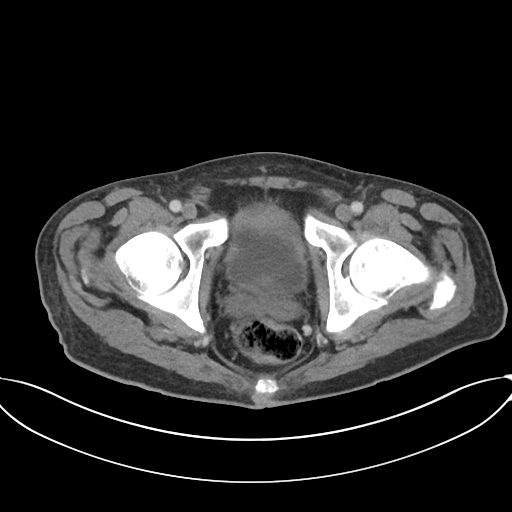
[im 44/143  soft-tissue]
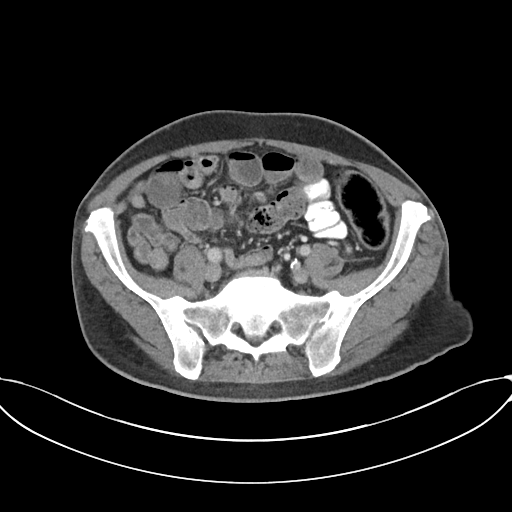
[im 55/143  soft-tissue]
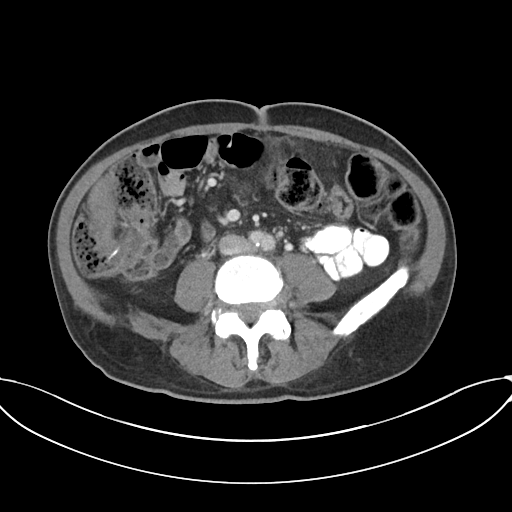
[im 66/143  soft-tissue]
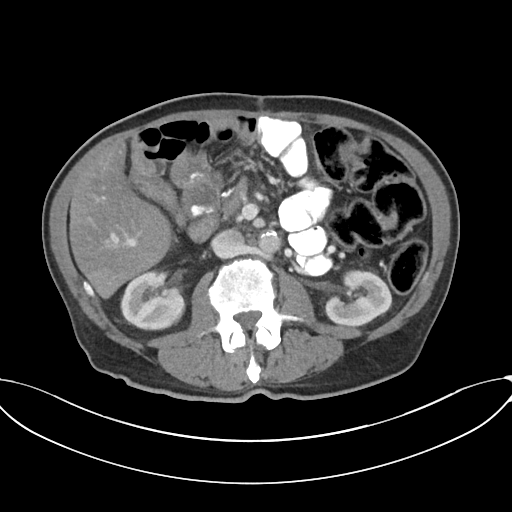
[im 77/143  soft-tissue]
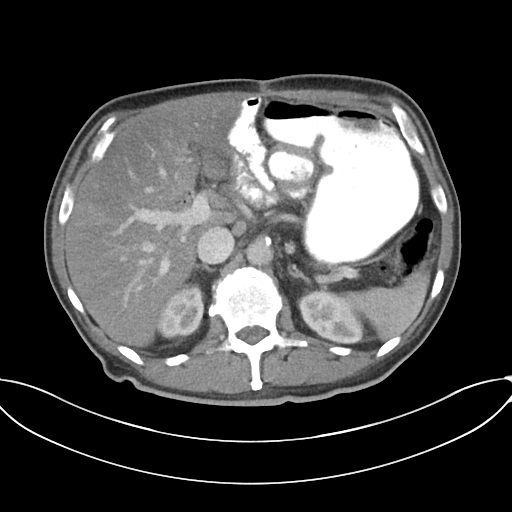
[im 88/143  soft-tissue]
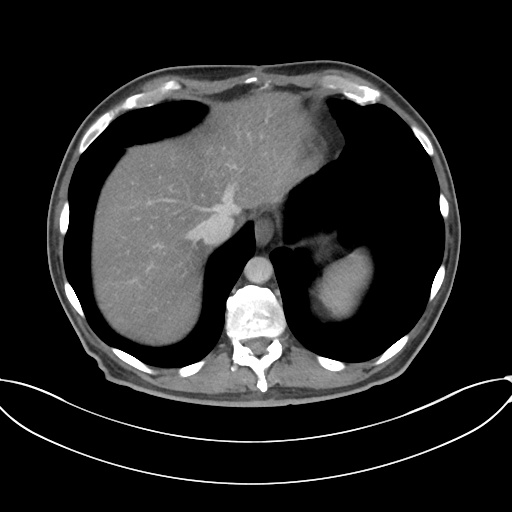
[im 110/143  soft-tissue]
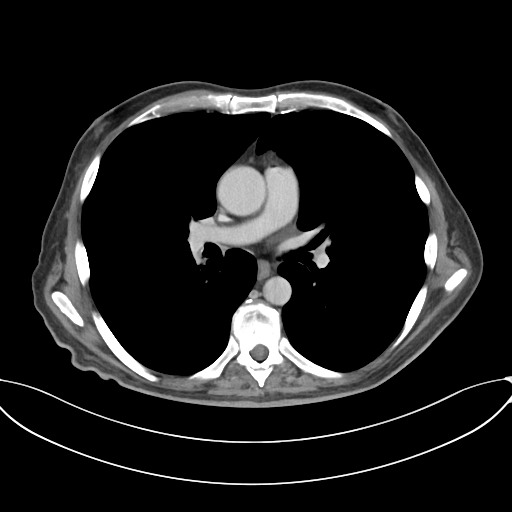
[im 121/143  soft-tissue]
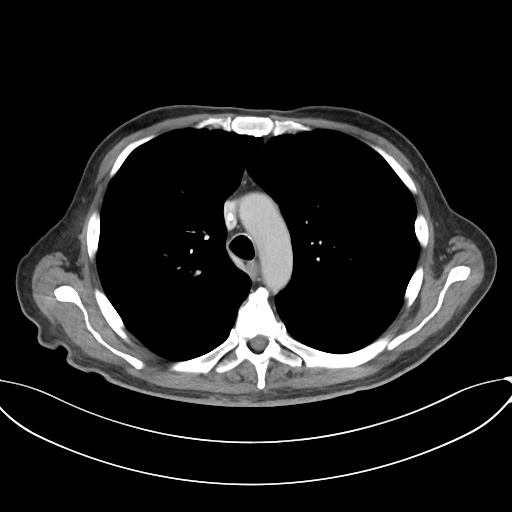
[im 121/143  bone]
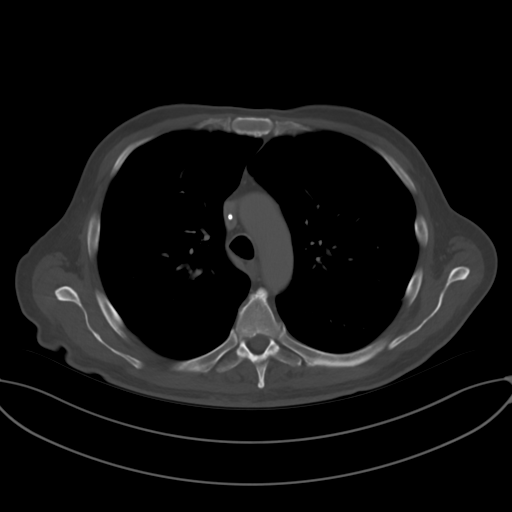
[im 132/143  soft-tissue]
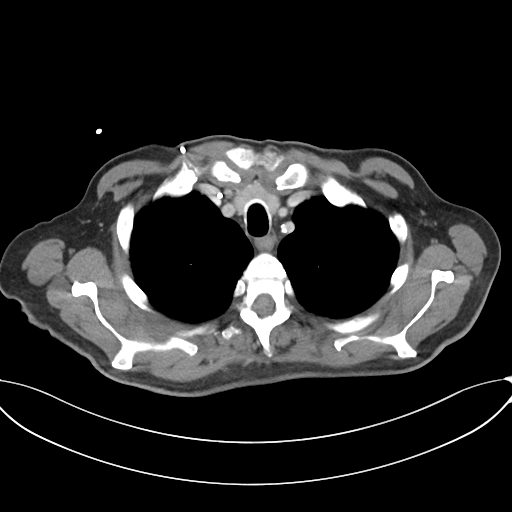

[Series 5: coronal · coronal · 0.87mm/px · 3 of 102 slices shown]
[im 34/102  soft-tissue]
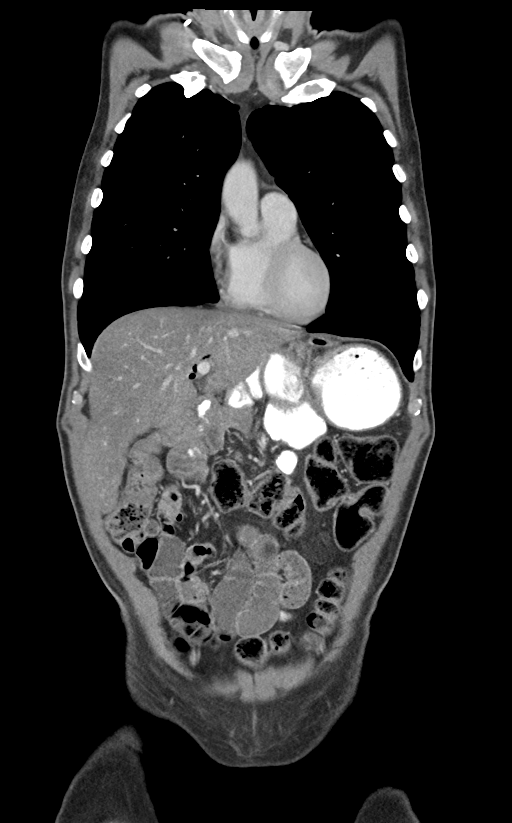
[im 45/102  soft-tissue]
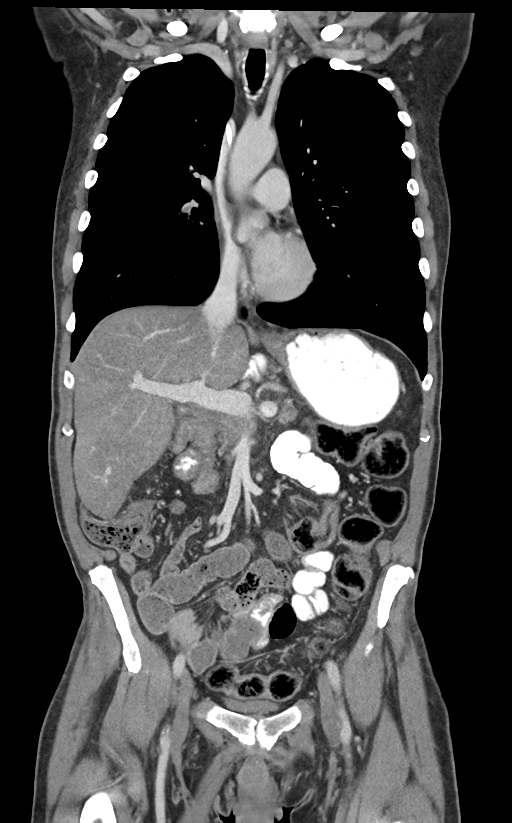
[im 57/102  soft-tissue]
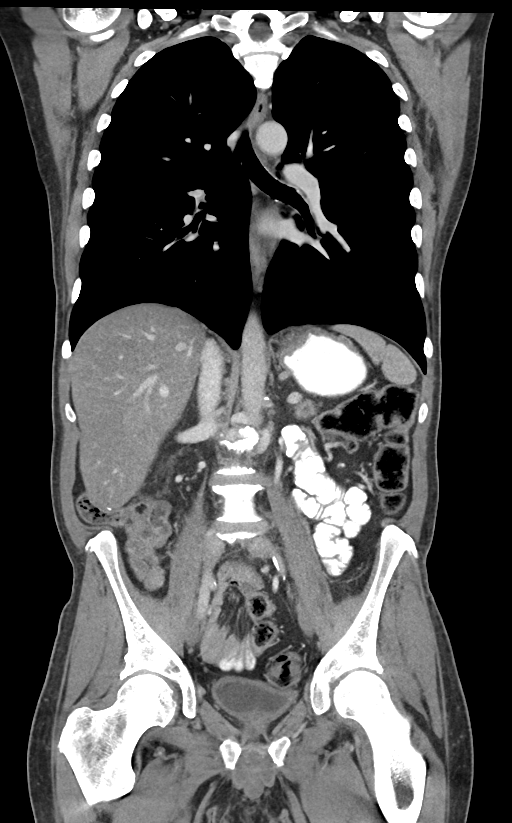

[13 of 46 positions shown; findings below may reference images not displayed]

RADIATION DOSE REDUCTION: This exam was performed according to the
departmental dose-optimization program which includes automated
exposure control, adjustment of the mA and/or kV according to
patient size and/or use of iterative reconstruction technique.

CONTRAST:  80mL OMNIPAQUE IOHEXOL 300 MG/ML  SOLN
FINDINGS: CT CHEST FINDINGS

Cardiovascular: Normal heart size. No significant pericardial
effusion/thickening. Three-vessel coronary atherosclerosis. Right
internal jugular Port-A-Cath terminates in the middle third of the
SVC. Atherosclerotic thoracic aorta with dilated 4.1 cm ascending
thoracic aorta, stable. Normal caliber pulmonary arteries. No
central pulmonary emboli.

Mediastinum/Nodes: No discrete thyroid nodules. Unremarkable
esophagus. No pathologically enlarged axillary, mediastinal or hilar
lymph nodes.

Lungs/Pleura: No pneumothorax. No pleural effusion. No acute
consolidative airspace disease, lung masses or significant pulmonary
nodules.

Musculoskeletal: No aggressive appearing focal osseous lesions.
Moderate thoracic spondylosis.

CT ABDOMEN PELVIS FINDINGS

Hepatobiliary: Normal liver size. No liver masses. Mildly
heterogeneous liver parenchymal enhancement, similar.
Cholecystectomy. Status post choledocho jejunostomy. Stable mild
intrahepatic pneumobilia predominantly in the left liver. No
significant intrahepatic biliary ductal dilatation.

Pancreas: Status post pancreatic head resection and
pancreaticojejunostomy. New ill-defined hypoenhancing 1.1 x 1.0 cm
lesion at the pancreatico-jejunal anastomosis (series 2/image 75)
with increased pancreatic duct dilation in the remnant pancreatic
body and tail up to 6 mm diameter, previously 4 mm. Increased
ill-defined soft tissue density posterior to the pancreatico-jejunal
anastomosis and partially encasing junction of the SMV and main
portal vein and partially encasing the proximal SMA (series 2/image
73).

Spleen: Normal size. No mass.

Adrenals/Urinary Tract: Normal adrenals. No hydronephrosis.
Scattered subcentimeter hypodense bilateral renal cortical lesions
are too small to characterize, for which no follow-up imaging is
recommended. Normal bladder.

Stomach/Bowel: Stable postsurgical changes from gastrojejunostomy
with no acute gastric abnormality. Similar top-normal caliber small
bowel loops without discrete small bowel caliber transition. Oral
contrast transits to the mid small bowel. No small bowel wall
thickening. Apparent appendectomy. Normal large bowel with no
diverticulosis, large bowel wall thickening or pericolonic fat
stranding.

Vascular/Lymphatic: Atherosclerotic nonaneurysmal abdominal aorta.
Patent hepatic, portal, splenic and renal veins. Mildly enlarged
cm portacaval node (series 2/image 67), stable. No additional
pathologically enlarged lymph nodes in the abdomen or pelvis.

Reproductive: Normal size prostate.

Other: No pneumoperitoneum, ascites or focal fluid collection.

Musculoskeletal: No aggressive appearing focal osseous lesions. Mild
lumbar spondylosis.
IMPRESSION: 1. New ill-defined hypoenhancing 1.1 cm lesion at the
pancreatico-jejunal anastomosis with increased pancreatic duct
dilation in the remnant pancreatic body and tail. Increased
ill-defined soft tissue density posterior to the pancreatico-jejunal
anastomosis and partially encasing the junction of the SMV and main
portal vein and proximal SMA. Findings are worrisome for local tumor
recurrence.
2. Stable mild portacaval adenopathy.
3. No evidence of metastatic disease in the chest. No additional
potential sites of metastatic disease in the abdomen or pelvis.
4. Aortic Atherosclerosis ([KB]-[KB]).

## 2021-11-08 MED ORDER — SODIUM CHLORIDE 0.9% FLUSH
10.0000 mL | INTRAVENOUS | Status: DC | PRN
  Filled 2021-11-08: qty 10

## 2021-11-08 MED ORDER — IOHEXOL 300 MG/ML  SOLN
100.0000 mL | Freq: Once | INTRAMUSCULAR | Status: AC | PRN
Start: 2021-11-08 — End: 2021-11-08
  Administered 2021-11-08: 80 mL via INTRAVENOUS

## 2021-11-08 MED ORDER — HEPARIN SOD (PORK) LOCK FLUSH 100 UNIT/ML IV SOLN
500.0000 [IU] | Freq: Once | INTRAVENOUS | Status: AC
  Administered 2021-11-08: 500 [IU] via INTRAVENOUS

## 2021-11-09 ENCOUNTER — Other Ambulatory Visit (HOSPITAL_COMMUNITY): Payer: Self-pay

## 2021-11-12 DIAGNOSIS — M170 Bilateral primary osteoarthritis of knee: Secondary | ICD-10-CM | POA: Diagnosis not present

## 2021-11-12 MED FILL — Ondansetron HCl Inj 40 MG/20ML (2 MG/ML): INTRAMUSCULAR | Qty: 4 | Status: AC

## 2021-11-13 ENCOUNTER — Inpatient Hospital Stay (HOSPITAL_BASED_OUTPATIENT_CLINIC_OR_DEPARTMENT_OTHER): Payer: BC Managed Care – PPO | Admitting: Hematology

## 2021-11-13 ENCOUNTER — Inpatient Hospital Stay (HOSPITAL_COMMUNITY): Payer: BC Managed Care – PPO

## 2021-11-13 VITALS — BP 118/72 | HR 89 | Temp 97.5°F | Resp 18 | Ht 74.0 in | Wt 179.6 lb

## 2021-11-13 DIAGNOSIS — R300 Dysuria: Secondary | ICD-10-CM | POA: Diagnosis not present

## 2021-11-13 DIAGNOSIS — C259 Malignant neoplasm of pancreas, unspecified: Secondary | ICD-10-CM

## 2021-11-13 DIAGNOSIS — R200 Anesthesia of skin: Secondary | ICD-10-CM | POA: Diagnosis not present

## 2021-11-13 DIAGNOSIS — R5383 Other fatigue: Secondary | ICD-10-CM | POA: Diagnosis not present

## 2021-11-13 DIAGNOSIS — Z79899 Other long term (current) drug therapy: Secondary | ICD-10-CM | POA: Diagnosis not present

## 2021-11-13 DIAGNOSIS — Z5111 Encounter for antineoplastic chemotherapy: Secondary | ICD-10-CM | POA: Diagnosis not present

## 2021-11-13 DIAGNOSIS — M25473 Effusion, unspecified ankle: Secondary | ICD-10-CM | POA: Diagnosis not present

## 2021-11-13 DIAGNOSIS — E119 Type 2 diabetes mellitus without complications: Secondary | ICD-10-CM | POA: Diagnosis not present

## 2021-11-13 DIAGNOSIS — Z794 Long term (current) use of insulin: Secondary | ICD-10-CM | POA: Diagnosis not present

## 2021-11-13 DIAGNOSIS — E785 Hyperlipidemia, unspecified: Secondary | ICD-10-CM | POA: Diagnosis not present

## 2021-11-13 DIAGNOSIS — Z79890 Hormone replacement therapy (postmenopausal): Secondary | ICD-10-CM | POA: Diagnosis not present

## 2021-11-13 DIAGNOSIS — F17290 Nicotine dependence, other tobacco product, uncomplicated: Secondary | ICD-10-CM | POA: Diagnosis not present

## 2021-11-13 DIAGNOSIS — R42 Dizziness and giddiness: Secondary | ICD-10-CM | POA: Diagnosis not present

## 2021-11-13 DIAGNOSIS — R634 Abnormal weight loss: Secondary | ICD-10-CM | POA: Diagnosis not present

## 2021-11-13 DIAGNOSIS — C250 Malignant neoplasm of head of pancreas: Secondary | ICD-10-CM | POA: Diagnosis not present

## 2021-11-13 DIAGNOSIS — M2550 Pain in unspecified joint: Secondary | ICD-10-CM | POA: Diagnosis not present

## 2021-11-13 LAB — COMPREHENSIVE METABOLIC PANEL
ALT: 16 U/L (ref 0–44)
AST: 22 U/L (ref 15–41)
Albumin: 2.5 g/dL — ABNORMAL LOW (ref 3.5–5.0)
Alkaline Phosphatase: 122 U/L (ref 38–126)
Anion gap: 6 (ref 5–15)
BUN: 13 mg/dL (ref 8–23)
CO2: 22 mmol/L (ref 22–32)
Calcium: 8 mg/dL — ABNORMAL LOW (ref 8.9–10.3)
Chloride: 110 mmol/L (ref 98–111)
Creatinine, Ser: 1.02 mg/dL (ref 0.61–1.24)
GFR, Estimated: >60 mL/min (ref >60)
Glucose, Bld: 237 mg/dL — ABNORMAL HIGH (ref 70–99)
Potassium: 3.5 mmol/L (ref 3.5–5.1)
Sodium: 138 mmol/L (ref 135–145)
Total Bilirubin: 0.4 mg/dL (ref 0.3–1.2)
Total Protein: 5.7 g/dL — ABNORMAL LOW (ref 6.5–8.1)

## 2021-11-13 LAB — CBC WITH DIFFERENTIAL/PLATELET
Abs Immature Granulocytes: 0 10*3/uL (ref 0.00–0.07)
Basophils Absolute: 0 10*3/uL (ref 0.0–0.1)
Basophils Relative: 0 %
Eosinophils Absolute: 0.5 10*3/uL (ref 0.0–0.5)
Eosinophils Relative: 4 %
HCT: 32.4 % — ABNORMAL LOW (ref 39.0–52.0)
Hemoglobin: 10.8 g/dL — ABNORMAL LOW (ref 13.0–17.0)
Lymphocytes Relative: 9 %
Lymphs Abs: 1.2 10*3/uL (ref 0.7–4.0)
MCH: 36.1 pg — ABNORMAL HIGH (ref 26.0–34.0)
MCHC: 33.3 g/dL (ref 30.0–36.0)
MCV: 108.4 fL — ABNORMAL HIGH (ref 80.0–100.0)
Monocytes Absolute: 0.5 10*3/uL (ref 0.1–1.0)
Monocytes Relative: 4 %
Neutro Abs: 11 10*3/uL — ABNORMAL HIGH (ref 1.7–7.7)
Neutrophils Relative %: 83 %
Platelets: 289 10*3/uL (ref 150–400)
RBC: 2.99 MIL/uL — ABNORMAL LOW (ref 4.22–5.81)
RDW: 19.8 % — ABNORMAL HIGH (ref 11.5–15.5)
WBC: 13.3 10*3/uL — ABNORMAL HIGH (ref 4.0–10.5)
nRBC: 0 % (ref 0.0–0.2)

## 2021-11-13 LAB — MAGNESIUM: Magnesium: 1.9 mg/dL (ref 1.7–2.4)

## 2021-11-14 LAB — CANCER ANTIGEN 19-9: CA 19-9: 206 U/mL — ABNORMAL HIGH (ref 0–35)

## 2021-11-21 ENCOUNTER — Inpatient Hospital Stay (HOSPITAL_COMMUNITY): Payer: BC Managed Care – PPO

## 2021-11-23 ENCOUNTER — Other Ambulatory Visit (HOSPITAL_COMMUNITY)
Admission: RE | Admit: 2021-11-23 | Discharge: 2021-11-23 | Disposition: A | Discharge status: Home / Self Care | Payer: BC Managed Care – PPO | Source: Ambulatory Visit | Attending: Family Medicine | Admitting: Family Medicine

## 2021-11-23 ENCOUNTER — Ambulatory Visit (INDEPENDENT_AMBULATORY_CARE_PROVIDER_SITE_OTHER): Payer: BC Managed Care – PPO | Admitting: Family Medicine

## 2021-11-23 ENCOUNTER — Ambulatory Visit: Payer: BC Managed Care – PPO | Admitting: Family Medicine

## 2021-11-23 ENCOUNTER — Ambulatory Visit (HOSPITAL_COMMUNITY)
Admission: RE | Admit: 2021-11-23 | Discharge: 2021-11-23 | Disposition: A | Discharge status: Home / Self Care | Payer: BC Managed Care – PPO | Source: Ambulatory Visit | Attending: Family Medicine | Admitting: Family Medicine

## 2021-11-23 ENCOUNTER — Encounter: Payer: Self-pay | Admitting: Family Medicine

## 2021-11-23 VITALS — BP 118/74 | Wt 175.2 lb

## 2021-11-23 DIAGNOSIS — R600 Localized edema: Secondary | ICD-10-CM | POA: Diagnosis not present

## 2021-11-23 DIAGNOSIS — M79604 Pain in right leg: Secondary | ICD-10-CM

## 2021-11-23 DIAGNOSIS — I82533 Chronic embolism and thrombosis of popliteal vein, bilateral: Secondary | ICD-10-CM | POA: Diagnosis not present

## 2021-11-23 DIAGNOSIS — I82493 Acute embolism and thrombosis of other specified deep vein of lower extremity, bilateral: Secondary | ICD-10-CM | POA: Diagnosis not present

## 2021-11-23 LAB — D-DIMER, QUANTITATIVE: D-Dimer, Quant: 2.46 ug/mL-FEU — ABNORMAL HIGH (ref 0.00–0.50)

## 2021-11-23 MED ORDER — AZELASTINE HCL 0.1 % NA SOLN: 2.0000 | Freq: Two times a day (BID) | NASAL | 12 refills | Status: DC

## 2021-11-23 MED ORDER — APIXABAN 5 MG PO TABS: ORAL_TABLET | ORAL | 0 refills | Status: DC

## 2021-11-23 NOTE — Progress Notes (Signed)
   Subjective:    Patient ID: Alexander Banks, male    DOB: 1956-05-09, 66 y.o.   MRN: [id number]  HPI Pt arrives today with bilateral leg swelling. Right leg is more painful than left. Left leg is swelling but not as painful. Pt had injections in both legs recently. Legs began to swell about one week ago. Pt states small area on lower right leg is tender to touch.   Patient relates bilateral leg swelling present for a little over a week with right calf pain did have some left calf pain that is doing better denies chest pain or shortness of breath.  Denies sweats chills or fevers.  Review of Systems     Objective:   Physical Exam Lungs are clear heart regular pulse normal bilateral lower leg edema is noted worse on the right than the left tenderness in the calf pulses in the feet are normal no tenderness on the left side  D-dimer drawn Ultrasound shows bilateral DVT to the popliteal     Assessment & Plan:  Bilateral DVT History of pancreatic cancer Seeing hematology oncology on a regular basis The benefits and risk of blood thinners was discussed in detail Certainly patient at strong risk of pulmonary embolus without treatment Patient was advised to stay away from all type of anti-inflammatories or aspirin Go ahead and start Eliquis starter pack for 7 days higher dose then after that 5 mg twice daily Ongoing long-term management will be through [name] Patient has an appointment with him this coming week Patient was instructed to avoid cuts avoid high places minimize use of any dangerous equipment Knee-high compression hose discussed If severe swelling in the legs progressive may even need thrombectomy but currently right now does not  Patient does not have pulmonary embolus currently but warning signs were discussed as well as bleeding potentials and when to go to ER

## 2021-11-26 MED FILL — Ondansetron HCl Inj 40 MG/20ML (2 MG/ML): INTRAMUSCULAR | Qty: 4 | Status: AC

## 2021-11-27 ENCOUNTER — Inpatient Hospital Stay (HOSPITAL_BASED_OUTPATIENT_CLINIC_OR_DEPARTMENT_OTHER): Payer: BC Managed Care – PPO | Admitting: Hematology

## 2021-11-27 ENCOUNTER — Inpatient Hospital Stay (HOSPITAL_COMMUNITY): Payer: BC Managed Care – PPO

## 2021-11-27 ENCOUNTER — Inpatient Hospital Stay (HOSPITAL_COMMUNITY): Payer: BC Managed Care – PPO | Attending: Hematology

## 2021-11-27 DIAGNOSIS — C250 Malignant neoplasm of head of pancreas: Secondary | ICD-10-CM | POA: Diagnosis not present

## 2021-11-27 DIAGNOSIS — M79606 Pain in leg, unspecified: Secondary | ICD-10-CM | POA: Insufficient documentation

## 2021-11-27 DIAGNOSIS — Z9049 Acquired absence of other specified parts of digestive tract: Secondary | ICD-10-CM | POA: Insufficient documentation

## 2021-11-27 DIAGNOSIS — C259 Malignant neoplasm of pancreas, unspecified: Secondary | ICD-10-CM

## 2021-11-27 DIAGNOSIS — G4730 Sleep apnea, unspecified: Secondary | ICD-10-CM | POA: Insufficient documentation

## 2021-11-27 DIAGNOSIS — Z79899 Other long term (current) drug therapy: Secondary | ICD-10-CM | POA: Diagnosis not present

## 2021-11-27 DIAGNOSIS — Z888 Allergy status to other drugs, medicaments and biological substances status: Secondary | ICD-10-CM | POA: Diagnosis not present

## 2021-11-27 DIAGNOSIS — M2550 Pain in unspecified joint: Secondary | ICD-10-CM | POA: Insufficient documentation

## 2021-11-27 DIAGNOSIS — Z8249 Family history of ischemic heart disease and other diseases of the circulatory system: Secondary | ICD-10-CM | POA: Diagnosis not present

## 2021-11-27 DIAGNOSIS — Z79890 Hormone replacement therapy (postmenopausal): Secondary | ICD-10-CM | POA: Diagnosis not present

## 2021-11-27 DIAGNOSIS — E785 Hyperlipidemia, unspecified: Secondary | ICD-10-CM | POA: Diagnosis not present

## 2021-11-27 DIAGNOSIS — Z7901 Long term (current) use of anticoagulants: Secondary | ICD-10-CM | POA: Insufficient documentation

## 2021-11-27 DIAGNOSIS — I10 Essential (primary) hypertension: Secondary | ICD-10-CM | POA: Insufficient documentation

## 2021-11-27 DIAGNOSIS — Z806 Family history of leukemia: Secondary | ICD-10-CM | POA: Insufficient documentation

## 2021-11-27 DIAGNOSIS — F17290 Nicotine dependence, other tobacco product, uncomplicated: Secondary | ICD-10-CM | POA: Diagnosis not present

## 2021-11-27 DIAGNOSIS — Z95828 Presence of other vascular implants and grafts: Secondary | ICD-10-CM

## 2021-11-27 DIAGNOSIS — E119 Type 2 diabetes mellitus without complications: Secondary | ICD-10-CM | POA: Diagnosis not present

## 2021-11-27 LAB — COMPREHENSIVE METABOLIC PANEL
ALT: 14 U/L (ref 0–44)
AST: 20 U/L (ref 15–41)
Albumin: 2.2 g/dL — ABNORMAL LOW (ref 3.5–5.0)
Alkaline Phosphatase: 114 U/L (ref 38–126)
Anion gap: 4 — ABNORMAL LOW (ref 5–15)
BUN: 12 mg/dL (ref 8–23)
CO2: 23 mmol/L (ref 22–32)
Calcium: 7.5 mg/dL — ABNORMAL LOW (ref 8.9–10.3)
Chloride: 110 mmol/L (ref 98–111)
Creatinine, Ser: 0.9 mg/dL (ref 0.61–1.24)
GFR, Estimated: >60 mL/min (ref >60)
Glucose, Bld: 224 mg/dL — ABNORMAL HIGH (ref 70–99)
Potassium: 3.8 mmol/L (ref 3.5–5.1)
Sodium: 137 mmol/L (ref 135–145)
Total Bilirubin: 0.6 mg/dL (ref 0.3–1.2)
Total Protein: 5.4 g/dL — ABNORMAL LOW (ref 6.5–8.1)

## 2021-11-27 LAB — CBC WITH DIFFERENTIAL/PLATELET
Abs Immature Granulocytes: 0.01 10*3/uL (ref 0.00–0.07)
Basophils Absolute: 0.1 10*3/uL (ref 0.0–0.1)
Basophils Relative: 0 %
Eosinophils Absolute: 0 10*3/uL (ref 0.0–0.5)
Eosinophils Relative: 0 %
HCT: 32.1 % — ABNORMAL LOW (ref 39.0–52.0)
Hemoglobin: 10.6 g/dL — ABNORMAL LOW (ref 13.0–17.0)
Immature Granulocytes: 0 %
Lymphocytes Relative: 17 %
Lymphs Abs: 2 10*3/uL (ref 0.7–4.0)
MCH: 35.3 pg — ABNORMAL HIGH (ref 26.0–34.0)
MCHC: 33 g/dL (ref 30.0–36.0)
MCV: 107 fL — ABNORMAL HIGH (ref 80.0–100.0)
Monocytes Absolute: 0.6 10*3/uL (ref 0.1–1.0)
Monocytes Relative: 6 %
Neutro Abs: 9 10*3/uL — ABNORMAL HIGH (ref 1.7–7.7)
Neutrophils Relative %: 77 %
Platelets: 304 10*3/uL (ref 150–400)
RBC: 3 MIL/uL — ABNORMAL LOW (ref 4.22–5.81)
RDW: 18.9 % — ABNORMAL HIGH (ref 11.5–15.5)
WBC: 11.7 10*3/uL — ABNORMAL HIGH (ref 4.0–10.5)
nRBC: 0 % (ref 0.0–0.2)

## 2021-11-27 LAB — MAGNESIUM: Magnesium: 1.8 mg/dL (ref 1.7–2.4)

## 2021-11-27 MED ORDER — HEPARIN SOD (PORK) LOCK FLUSH 100 UNIT/ML IV SOLN
500.0000 [IU] | Freq: Once | INTRAVENOUS | Status: AC
  Administered 2021-11-27: 500 [IU] via INTRAVENOUS

## 2021-11-27 MED ORDER — SODIUM CHLORIDE 0.9% FLUSH
10.0000 mL | INTRAVENOUS | Status: DC | PRN
  Administered 2021-11-27: 10 mL via INTRAVENOUS

## 2021-11-27 NOTE — Progress Notes (Signed)
Patient will not get treatment today.  Port deacessed and he tolerated well.  Patient will follow up as discussed by [name] during office visit.  He was discharged ambulatory and in stable condition from clinic with family.

## 2021-11-27 NOTE — Patient Instructions (Addendum)
[place] at [facility] Discharge Instructions   You were seen and examined today by [name].  Dr. Phoebe Perch office was unable to get the scan loaded. They requested the scan to be loaded again, and will get back with [place] as soon as they review the scan.   Dr. Raliegh Ip will take over managing your Eliquis. You will be on this for at least 6 months.   Follow up will be determined by when we hear back from Dr. Phoebe Perch office. We will call you with an appointment.    Thank you for choosing [place] at [facility] to provide your oncology and hematology care.  To afford each patient quality time with our provider, please arrive at least 15 minutes before your scheduled appointment time.   If you have a lab appointment with the [facility] please come in thru the Main Entrance and check in at the main information desk.  You need to re-schedule your appointment should you arrive 10 or more minutes late.  We strive to give you quality time with our providers, and arriving late affects you and other patients whose appointments are after yours.  Also, if you no show three or more times for appointments you may be dismissed from the clinic at the providers discretion.     Again, thank you for choosing [facility].  Our hope is that these requests will decrease the amount of time that you wait before being seen by our physicians.       _____________________________________________________________  Should you have questions after your visit to [facility], please contact our office at [phone] and follow the prompts.  Our office hours are 8:00 a.m. and 4:30 p.m. Monday - Friday.  Please note that voicemails left after 4:00 p.m. may not be returned until the following business day.  We are closed weekends and major holidays.  You do have access to a nurse 24-7, just call the main number to the clinic [phone] and do not  press any options, hold on the line and a nurse will answer the phone.    For prescription refill requests, have your pharmacy contact our office and allow 72 hours.    Due to Covid, you will need to wear a mask upon entering the hospital. If you do not have a mask, a mask will be given to you at the Main Entrance upon arrival. For doctor visits, patients may have 1 support person age 55 or older with them. For treatment visits, patients can not have anyone with them due to social distancing guidelines and our immunocompromised population.

## 2021-11-27 NOTE — Patient Instructions (Signed)
You were seen today by [name] and he doesn't want you to get treatment today.  You will follow up as directed by him in the visit.

## 2021-11-27 NOTE — Progress Notes (Signed)
[address] [place], [place] [address]   CLINIC:  Medical Oncology/Hematology  PCP:  Alexander Drown, MD [place] / [place] [address] [phone]   REASON FOR VISIT:  Follow-up for pancreatic adenocarcinoma  PRIOR THERAPY: FOLFIRINOX every 2 weeks x 4 cycles  NGS Results: not done  CURRENT THERAPY: Gemcitabine D1,8,15 + Capecitabine D1-21 q28d x 6 cycles  BRIEF ONCOLOGIC HISTORY:  Oncology History  Pancreatic adenocarcinoma ([place])  12/04/2020 Initial Diagnosis   Pancreatic adenocarcinoma ([place])   01/10/2021 - 03/16/2021 Chemotherapy   Patient is on Treatment Plan : PANCREAS Modified FOLFIRINOX q14d x 4 cycles      Genetic Testing   [facility] CancerNext-Expanded Panel+RNA was Negative. Of note, a variant of uncertain significance was detected in the BRCA1 gene. Report date is 04/23/2021.  The CancerNext-Expanded gene panel offered by [facility] and includes sequencing, rearrangement, and RNA analysis for the following 77 genes: AIP, ALK, APC, ATM, AXIN2, BAP1, BARD1, BLM, BMPR1A, BRCA1, BRCA2, BRIP1, CDC73, CDH1, CDK4, CDKN1B, CDKN2A, CHEK2, CTNNA1, DICER1, FANCC, FH, FLCN, GALNT12, KIF1B, LZTR1, MAX, MEN1, MET, MLH1, MSH2, MSH3, MSH6, MUTYH, NBN, NF1, NF2, NTHL1, PALB2, PHOX2B, PMS2, POT1, PRKAR1A, PTCH1, PTEN, RAD51C, RAD51D, RB1, RECQL, RET, SDHA, SDHAF2, SDHB, SDHC, SDHD, SMAD4, SMARCA4, SMARCB1, SMARCE1, STK11, SUFU, TMEM127, TP53, TSC1, TSC2, VHL and XRCC2 (sequencing and deletion/duplication); EGFR, EGLN1, HOXB13, KIT, MITF, PDGFRA, POLD1, and POLE (sequencing only); EPCAM and GREM1 (deletion/duplication only).    07/23/2021 -  Chemotherapy   Patient is on Treatment Plan : PANCREAS Gemcitabine D1,8,15 + Capecitabine D1-21 q28d x 6 cycles       CANCER STAGING:  Cancer Staging  Pancreatic adenocarcinoma [facility]) Staging form: Exocrine Pancreas, AJCC 8th Edition - Clinical stage from 12/04/2020: Stage IB (cT2, cN0, cM0) -  Unsigned   INTERVAL HISTORY:  [name], a 66 y.o. male, returns for routine follow-up and consideration for next cycle of chemotherapy. [name] was last seen on 11/13/2021.  Due for cycle #5 of Gemcitabine today.   Overall, he tells me he has been feeling pretty well, and he is accompanied by his wife. The pain and swelling his legs has improved. The peeling on his hands and feet have resolved; he reports dry skin on his hands. He was started on Eliquis on 7/7. He has lost 4 lbs since his last visit. His appetite has reduced over the past week, but he continues to try to eat consistent portions. His energy overall has improved.    Overall, he will not receive chemo today.    REVIEW OF SYSTEMS:  Review of Systems  Constitutional:  Positive for appetite change and unexpected weight change (-4 lbs). Negative for fatigue.  Musculoskeletal:  Positive for arthralgias (6/10 legs).  All other systems reviewed and are negative.   PAST MEDICAL/SURGICAL HISTORY:  Past Medical History:  Diagnosis Date   Anemia, iron deficiency    Diabetes mellitus without complication ([place])    on meds   Hearing loss    Bil hearing aids   Hyperlipidemia    Hypertension    Port-A-Cath in place 12/26/2020   Prediabetes    Sleep apnea    Past Surgical History:  Procedure Laterality Date   BILIARY STENT PLACEMENT N/A 11/09/2020   Procedure: BILIARY STENT PLACEMENT - 66 F X 5 CM STENT;  Surgeon: Alexander Houston, MD;  Location: [facility];  Service: Endoscopy;  Laterality: N/A;   BILIARY STENT PLACEMENT N/A 12/27/2020  Procedure: BILIARY STENT PLACEMENT;  Surgeon: Alexander Houston, MD;  Location: [facility];  Service: Endoscopy;  Laterality: N/A;   COLONOSCOPY     ERCP N/A 11/09/2020   Procedure: ENDOSCOPIC RETROGRADE CHOLANGIOPANCREATOGRAPHY (ERCP);  Surgeon: Alexander Houston, MD;  Location: [facility];  Service: Endoscopy;  Laterality: N/A;   ERCP N/A 12/27/2020   Procedure: ENDOSCOPIC RETROGRADE  CHOLANGIOPANCREATOGRAPHY (ERCP);  Surgeon: Alexander Houston, MD;  Location: [facility];  Service: Endoscopy;  Laterality: N/A;   ESOPHAGOGASTRODUODENOSCOPY (EGD) WITH PROPOFOL N/A 11/30/2020   Procedure: ESOPHAGOGASTRODUODENOSCOPY (EGD) WITH PROPOFOL;  Surgeon: Alexander Banister, MD;  Location: [facility];  Service: Endoscopy;  Laterality: N/A;   EUS N/A 11/30/2020   Procedure: UPPER ENDOSCOPIC ULTRASOUND (EUS) RADIAL;  Surgeon: Alexander Banister, MD;  Location: [facility];  Service: Endoscopy;  Laterality: N/A;   FINE NEEDLE ASPIRATION N/A 11/30/2020   Procedure: FINE NEEDLE ASPIRATION (FNA) LINEAR;  Surgeon: Alexander Banister, MD;  Location: [facility];  Service: Endoscopy;  Laterality: N/A;   GIVENS CAPSULE STUDY     IR IMAGING GUIDED PORT INSERTION  12/18/2020   KNEE SURGERY     4 surgeries on left, 2 on right knee   SHOULDER SURGERY     right shoulder   SPHINCTEROTOMY N/A 11/09/2020   Procedure: SHORT BILIARY SPHINCTEROTOMY;  Surgeon: Alexander Houston, MD;  Location: [facility];  Service: Endoscopy;  Laterality: N/A;   WHIPPLE PROCEDURE      SOCIAL HISTORY:  Social History   Socioeconomic History   Marital status: Married    Spouse name: Not on file   Number of children: Not on file   Years of education: Not on file   Highest education level: Not on file  Occupational History   Not on file  Tobacco Use   Smoking status: Every Day    Types: Cigars   Smokeless tobacco: Never   Tobacco comments:    Smoke 3-4 cigars a day  Vaping Use   Vaping Use: Never used  Substance and Sexual Activity   Alcohol use: Yes    Comment: occasional   Drug use: Never   Sexual activity: Not on file  Other Topics Concern   Not on file  Social History Narrative   Not on file   Social Determinants of Health   Financial Resource Strain: Low Risk  (12/20/2020)   Overall Financial Resource Strain (CARDIA)    Difficulty of Paying Living Expenses: Not hard at all  Food Insecurity: No Food Insecurity  (12/20/2020)   Hunger Vital Sign    Worried About Running Out of Food in the Last Year: Never true    [place] in the Last Year: Never true  Transportation Needs: No Transportation Needs (12/20/2020)   PRAPARE - Hydrologist (Medical): No    Lack of Transportation (Non-Medical): No  Physical Activity: Sufficiently Active (12/20/2020)   Exercise Vital Sign    Days of Exercise per Week: 5 days    Minutes of Exercise per Session: 30 min  Recent Concern: Physical Activity - Inactive (11/17/2020)   Exercise Vital Sign    Days of Exercise per Week: 0 days    Minutes of Exercise per Session: 0 min  Stress: Not on file  Social Connections: Not on file  Intimate Partner Violence: Not At Risk (11/17/2020)   Humiliation, Afraid, Rape, and Kick questionnaire    Fear of Current or Ex-Partner: No    Emotionally Abused: No  Physically Abused: No    Sexually Abused: No    FAMILY HISTORY:  Family History  Problem Relation Age of Onset   Heart attack Father    Heart attack Paternal Grandfather    Leukemia Sister    Heart attack Brother     CURRENT MEDICATIONS:  Current Outpatient Medications  Medication Sig Dispense Refill   apixaban (ELIQUIS) 5 MG TABS tablet Take 2 tablets (102m) twice daily for 7 days, then 1 tablet (570m twice daily 60 tablet 0   azelastine (ASTELIN) 0.1 % nasal spray Place 2 sprays into both nostrils 2 (two) times daily. 30 mL 12   capecitabine (XELODA) 500 MG tablet Take 2 tablets (1,000 mg total) by mouth 2 (two) times daily after a meal. Take for 21 days on, followed by 7 days off. Repeat every 28 days. 84 tablet 0   clobetasol cream (TEMOVATE) 0.4.58 Apply 1 application. topically 2 (two) times daily. 60 g 3   Continuous Blood Gluc Sensor (FREESTYLE LIBRE 3 SENSOR) MISC 1 Piece by Does not apply route every 14 (fourteen) days. Place 1 sensor on the skin every 14 days. Use to check glucose continuously Dx E11.65 6 each 1    diphenoxylate-atropine (LOMOTIL) 2.5-0.025 MG tablet Take 1 tablet by mouth 4 (four) times daily as needed for diarrhea or loose stools. 60 tablet 0   Glucosamine-Chondroitin (OSTEO BI-FLEX REGULAR STRENGTH PO) Take 2 tablets by mouth daily.     insulin detemir (LEVEMIR) 100 UNIT/ML FlexPen Inject 8 Units into the skin at bedtime. 15 mL 1   insulin lispro (HUMALOG KWIKPEN) 100 UNIT/ML KwikPen Inject 4-7 Units into the skin 3 (three) times daily before meals. 15 mL 1   IRON-VITAMIN C PO Take 1 tablet by mouth daily.     levothyroxine (SYNTHROID) 175 MCG tablet TAKE 1 TABLET DAILY 90 tablet 1   levothyroxine (SYNTHROID) 25 MCG tablet Take one tablet po daily but on Sunday take 1 tablet (Patient taking differently: Take two tablet po daily but one on Sunday take 1 tablet) 135 tablet 1   lipase/protease/amylase (CREON) 36000 UNITS CPEP capsule Take 2 capsules (72,000 Units total) by mouth 3 (three) times daily with meals. May also take 1 capsule (36,000 Units total) as needed (with snacks). 240 capsule 11   oxyCODONE-acetaminophen (PERCOCET/ROXICET) 5-325 MG tablet Take 1 tablet by mouth every 6 (six) hours as needed for severe pain. 20 tablet 0   sildenafil (VIAGRA) 100 MG tablet 50 mg as needed.     triamcinolone cream (KENALOG) 0.1 % Apply to affected areas BID prn 30 g 3   No current facility-administered medications for this visit.    ALLERGIES:  Allergies  Allergen Reactions   Lisinopril Cough    PHYSICAL EXAM:  Performance status (ECOG): 1 - Symptomatic but completely ambulatory  There were no vitals filed for this visit. Wt Readings from Last 3 Encounters:  11/23/21 175 lb 3.2 oz (79.5 kg)  11/13/21 179 lb 9.6 oz (81.5 kg)  10/30/21 177 lb 9.6 oz (80.6 kg)   Physical Exam Vitals reviewed.  Constitutional:      Appearance: Normal appearance.  Cardiovascular:     Rate and Rhythm: Normal rate and regular rhythm.     Pulses: Normal pulses.     Heart sounds: Normal heart sounds.   Pulmonary:     Effort: Pulmonary effort is normal.     Breath sounds: Normal breath sounds.  Neurological:     General: No focal deficit present.  Mental Status: He is alert and oriented to person, place, and time.  Psychiatric:        Mood and Affect: Mood normal.        Behavior: Behavior normal.     LABORATORY DATA:  I have reviewed the labs as listed.     Latest Ref Rng & Units 11/13/2021   12:34 PM 10/30/2021   11:47 AM 10/23/2021   12:28 PM  CBC  WBC 4.0 - 10.5 K/uL 13.3  4.8  2.5   Hemoglobin 13.0 - 17.0 g/dL 10.8  9.8  9.4   Hematocrit 39.0 - 52.0 % 32.4  29.7  29.3   Platelets 150 - 400 K/uL 289  330  236       Latest Ref Rng & Units 11/13/2021   12:34 PM 10/30/2021   11:47 AM 10/23/2021   12:28 PM  CMP  Glucose 70 - 99 mg/dL 237  147  152   BUN 8 - 23 mg/dL _0 Creatinine 0.61 - 1.24 mg/dL 1.02  0.86  0.90   Sodium 135 - 145 mmol/L 138  139  140   Potassium 3.5 - 5.1 mmol/L 3.5  4.4  4.0   Chloride 98 - 111 mmol/L 110  110  108   CO2 22 - 32 mmol/L _1 Calcium 8.9 - 10.3 mg/dL 8.0  7.8  8.2   Total Protein 6.5 - 8.1 g/dL 5.7  5.7  5.7   Total Bilirubin 0.3 - 1.2 mg/dL 0.4  0.6  0.7   Alkaline Phos 38 - 126 U/L 122  123  126   AST 15 - 41 U/L _2 ALT 0 - 44 U/L _3 DIAGNOSTIC IMAGING:  I have independently reviewed the scans and discussed with the patient. US Venous Img Lower Bilateral  Result Date: 11/23/2021 CLINICAL DATA:  Leg swelling and RIGHT lower extremity pain for 1 week in a 66 year old male. EXAM: BILATERAL LOWER EXTREMITY VENOUS DOPPLER ULTRASOUND TECHNIQUE: Gray-scale sonography with graded compression, as well as color Doppler and duplex ultrasound were performed to evaluate the lower extremity deep venous systems from the level of the common femoral vein and including the common femoral, femoral, profunda femoral, popliteal and calf veins including the posterior tibial, peroneal and gastrocnemius veins when  visible. The superficial great saphenous vein was also interrogated. Spectral Doppler was utilized to evaluate flow at rest and with distal augmentation maneuvers in the common femoral, femoral and popliteal veins. COMPARISON:  Prior ultrasound of January 05/08/2022. FINDINGS: RIGHT LOWER EXTREMITY Common Femoral Vein: No evidence of thrombus. Normal compressibility, respiratory phasicity and response to augmentation. Saphenofemoral Junction: No evidence of thrombus. Normal compressibility and flow on color Doppler imaging. Profunda Femoral Vein: No evidence of thrombus. Normal compressibility and flow on color Doppler imaging. Femoral Vein: No evidence of thrombus. Normal compressibility, respiratory phasicity and response to augmentation. Popliteal Vein: Occlusive thrombus in the RIGHT popliteal vein extending into the posterior tibial, peroneal and gastrocs veins. Calf Veins: Thrombus extending from the popliteal vein into the calf veins. Superficial Great Saphenous Vein: No evidence of thrombus. Normal compressibility. Other Findings:  Extensive edema in the RIGHT lower leg. LEFT LOWER EXTREMITY Common Femoral Vein: No evidence of thrombus. Normal compressibility, respiratory phasicity and response to augmentation. Saphenofemoral Junction: No evidence of thrombus. Normal compressibility and flow on color Doppler imaging. Profunda Femoral Vein: No  evidence of thrombus. Normal compressibility and flow on color Doppler imaging. Femoral Vein: No evidence of thrombus. Normal compressibility, respiratory phasicity and response to augmentation. Popliteal Vein: Nonocclusive thrombus in the popliteal vein though it is nearly occlusive. Thrombus tracks into the anterior tibial vein where it appears occlusive, sparing peroneal veins and posterior tibial vein. Calf Veins: Anterior tibial venous involvement as discussed above. Superficial Great Saphenous Vein: No evidence of thrombus. Normal compressibility. Other Findings:   Edema in the LEFT lower extremity. IMPRESSION: Occlusive deep venous thrombosis beginning at the popliteal level in the RIGHT lower extremity and extending into calf veins. Nearly occlusive thrombus in the LEFT popliteal extending into anterior tibial vein as discussed above. Critical Value/emergent results were called by telephone at the time of interpretation on 11/23/2021 at 4:16 pm to provider Sallee Lange , who verbally acknowledged these results. Electronically Signed   By: Zetta Bills M.D.   On: 11/23/2021 16:16   CT CHEST ABDOMEN PELVIS W CONTRAST  Result Date: 11/09/2021 CLINICAL DATA:  Pancreatic cancer with history of Whipple procedure, on chemotherapy break. Restaging. * Tracking Code: BO * EXAM: CT CHEST, ABDOMEN, AND PELVIS WITH CONTRAST TECHNIQUE: Multidetector CT imaging of the chest, abdomen and pelvis was performed following the standard protocol during bolus administration of intravenous contrast. RADIATION DOSE REDUCTION: This exam was performed according to the departmental dose-optimization program which includes automated exposure control, adjustment of the mA and/or kV according to patient size and/or use of iterative reconstruction technique. CONTRAST:  77m OMNIPAQUE IOHEXOL 300 MG/ML  SOLN COMPARISON:  07/03/2021 CT chest, abdomen and pelvis. FINDINGS: CT CHEST FINDINGS Cardiovascular: Normal heart size. No significant pericardial effusion/thickening. Three-vessel coronary atherosclerosis. Right internal jugular Port-A-Cath terminates in the middle third of the SVC. Atherosclerotic thoracic aorta with dilated 4.1 cm ascending thoracic aorta, stable. Normal caliber pulmonary arteries. No central pulmonary emboli. Mediastinum/Nodes: No discrete thyroid nodules. Unremarkable esophagus. No pathologically enlarged axillary, mediastinal or hilar lymph nodes. Lungs/Pleura: No pneumothorax. No pleural effusion. No acute consolidative airspace disease, lung masses or significant pulmonary  nodules. Musculoskeletal: No aggressive appearing focal osseous lesions. Moderate thoracic spondylosis. CT ABDOMEN PELVIS FINDINGS Hepatobiliary: Normal liver size. No liver masses. Mildly heterogeneous liver parenchymal enhancement, similar. Cholecystectomy. Status post choledocho jejunostomy. Stable mild intrahepatic pneumobilia predominantly in the left liver. No significant intrahepatic biliary ductal dilatation. Pancreas: Status post pancreatic head resection and pancreaticojejunostomy. New ill-defined hypoenhancing 1.1 x 1.0 cm lesion at the pancreatico-jejunal anastomosis (series 2/image 75) with increased pancreatic duct dilation in the remnant pancreatic body and tail up to 6 mm diameter, previously 4 mm. Increased ill-defined soft tissue density posterior to the pancreatico-jejunal anastomosis and partially encasing junction of the SMV and main portal vein and partially encasing the proximal SMA (series 2/image 73). Spleen: Normal size. No mass. Adrenals/Urinary Tract: Normal adrenals. No hydronephrosis. Scattered subcentimeter hypodense bilateral renal cortical lesions are too small to characterize, for which no follow-up imaging is recommended. Normal bladder. Stomach/Bowel: Stable postsurgical changes from gastrojejunostomy with no acute gastric abnormality. Similar top-normal caliber small bowel loops without discrete small bowel caliber transition. Oral contrast transits to the mid small bowel. No small bowel wall thickening. Apparent appendectomy. Normal large bowel with no diverticulosis, large bowel wall thickening or pericolonic fat stranding. Vascular/Lymphatic: Atherosclerotic nonaneurysmal abdominal aorta. Patent hepatic, portal, splenic and renal veins. Mildly enlarged 1.0 cm portacaval node (series 2/image 67), stable. No additional pathologically enlarged lymph nodes in the abdomen or pelvis. Reproductive: Normal size prostate. Other: No pneumoperitoneum, ascites or focal  fluid collection.  Musculoskeletal: No aggressive appearing focal osseous lesions. Mild lumbar spondylosis. IMPRESSION: 1. New ill-defined hypoenhancing 1.1 cm lesion at the pancreatico-jejunal anastomosis with increased pancreatic duct dilation in the remnant pancreatic body and tail. Increased ill-defined soft tissue density posterior to the pancreatico-jejunal anastomosis and partially encasing the junction of the SMV and main portal vein and proximal SMA. Findings are worrisome for local tumor recurrence. 2. Stable mild portacaval adenopathy. 3. No evidence of metastatic disease in the chest. No additional potential sites of metastatic disease in the abdomen or pelvis. 4. Aortic Atherosclerosis (ICD10-I70.0). Electronically Signed   By: Ilona Sorrel M.D.   On: 11/09/2021 13:06     ASSESSMENT:  1.  Stage I (T2N0) pancreatic adenocarcinoma: - Recent admission to the hospital with DKA, found to have elevated liver enzymes. - MRCP without contrast on 11/08/2020 showed 2.3 x 2 cm central pancreatic head and uncinate mass with intra and extrahepatic biliary ductal dilation. - 11/09/2020-ERCP sphincterotomy and stenting with findings showing short high-grade distal CBD stricture with dilated CBD up to 14 mm.  Bile duct brushing was negative for malignant cells. - 11/10/2020-CA 19.9-367. - ERCP/EUS by [name] on 11/30/2020 with 2.9 cm mass in the head of the pancreas with unclear outer borders.  Given significant dilatation, indistinct borders, unable to confidently assess major vascular involvement.  Abutment and probable invasion of SMV and portal vein near the confluence with splenic vein.  No obvious SMA or celiac artery involvement.  No other adenopathy. - FNA of the pancreatic mass consistent with adenocarcinoma. - Weight loss intentional 25 pounds between January and April 1.  Additional 25 pound weight loss since 1 April, unintentional. - CT [facility] pancreatic protocol on 12/05/2020 with ill-defined mass in the pancreatic head  and uncinate process measuring up to 1.9 x 2.1 cm with associated pancreatic ductal dilatation up to 8 mm.  No enlarged adenopathy.  Teardrop morphology of SMV indicative of SMV involvement.  Replaced right hepatic arterial supply arises from the SMA passing immediately posterior to the tumor. - PET scan on 12/14/2020 with mass in the pancreatic head with hypermetabolic features, SUV 5.3 with no sign of metastatic disease. - 5 cycles of neoadjuvant FOLFIRINOX from 01/10/2021 through 03/14/2021. - Whipple's procedure with IRE by Dr.Ianniti on 05/15/2021. - Pathology consistent with grade 2 moderate adenocarcinoma, 3.7 cm, absent treatment effect, no evidence of tumor regression, margins positive, 6/36 nodes positive, [id number]. - Adjuvant chemotherapy for 4 months with gemcitabine +capecitabine started on 07/23/2021.  This will be followed by chemoradiation therapy with Xeloda for positive margins (R1 resection). - Cycle 1 of gemcitabine and capecitabine started on 07/23/2021.  2.  Social/family history: - He lives at home with his wife and is independent of all ADLs and IADLs. - He currently works in [employer].  No history of chemical exposure. - He previously smoked cigarettes.  Currently smokes cigars 4/day. - Maternal grandmother died at age 11 with malignancy, unknown type. - Sister died of acute leukemia.   PLAN:  1.  YpT2pN2 pancreatic adenocarcinoma: - CT CAP (11/08/2021): New ill-defined 1.1 cm lesion in the pancreaticoduodenal anastomosis with increased pancreatic duct dilatation measuring 6 mm, previously 4 mm.  Increased ill-defined soft tissue density posterior to the pancreatico jejunal anastomosis and partially encasing the junction of SMV and portal vein and proximal SMA.  Findings worrisome for local recurrence.  Stable mild portacaval adenopathy. - His last CA 19-9 has increased to 206. - We have reached out to Dr. Basilia Jumbo who  has done his Whipple's procedure with IRE. - Dr. Renato Shin will  review the scans and and give his opinion. - I have recommended NGS testing.  I will see him back after the review of scans.  2.  Weight loss: - He is eating well but lost about 4 pounds.  Partly fluid loss.  Continue supplements.  3.  Hypomagnesemia: - Continue magnesium 1 tablet daily.  Magnesium is normal.  4.  Pancreatic insufficiency: - Continue Creon 2 capsules with meals and 1 capsule with snacks.  5.  Bilateral DVT: - Doppler on 11/23/2021 showing occlusive popliteal DVT in the right leg and left leg. - Continue Eliquis indefinitely.   Orders placed this encounter:  No orders of the defined types were placed in this encounter.    Derek Jack, MD [place] [phone]   I, Thana Ates, am acting as a scribe for Dr. Derek Jack.  I, Derek Jack MD, have reviewed the above documentation for accuracy and completeness, and I agree with the above.

## 2021-11-28 ENCOUNTER — Inpatient Hospital Stay (HOSPITAL_COMMUNITY): Payer: BC Managed Care – PPO

## 2021-11-29 ENCOUNTER — Other Ambulatory Visit (HOSPITAL_COMMUNITY): Payer: Self-pay

## 2021-12-02 ENCOUNTER — Other Ambulatory Visit: Payer: Self-pay | Admitting: Family Medicine

## 2021-12-04 ENCOUNTER — Inpatient Hospital Stay (HOSPITAL_COMMUNITY): Payer: BC Managed Care – PPO

## 2021-12-10 ENCOUNTER — Other Ambulatory Visit: Payer: Self-pay

## 2021-12-11 ENCOUNTER — Emergency Department (HOSPITAL_COMMUNITY): Payer: BC Managed Care – PPO

## 2021-12-11 ENCOUNTER — Ambulatory Visit (HOSPITAL_COMMUNITY): Payer: BC Managed Care – PPO

## 2021-12-11 ENCOUNTER — Inpatient Hospital Stay (HOSPITAL_COMMUNITY)
Admission: EM | Admit: 2021-12-11 | Discharge: 2021-12-13 | DRG: 864 | Disposition: A | Discharge status: Home / Self Care | Payer: BC Managed Care – PPO | Attending: Family Medicine | Admitting: Family Medicine

## 2021-12-11 ENCOUNTER — Telehealth: Payer: Self-pay | Admitting: Family Medicine

## 2021-12-11 ENCOUNTER — Other Ambulatory Visit: Payer: Self-pay

## 2021-12-11 ENCOUNTER — Ambulatory Visit (HOSPITAL_COMMUNITY): Payer: BC Managed Care – PPO | Admitting: Hematology

## 2021-12-11 ENCOUNTER — Other Ambulatory Visit (HOSPITAL_COMMUNITY): Payer: BC Managed Care – PPO

## 2021-12-11 ENCOUNTER — Encounter (HOSPITAL_COMMUNITY): Payer: Self-pay

## 2021-12-11 DIAGNOSIS — Z794 Long term (current) use of insulin: Secondary | ICD-10-CM | POA: Diagnosis not present

## 2021-12-11 DIAGNOSIS — Z95828 Presence of other vascular implants and grafts: Secondary | ICD-10-CM | POA: Diagnosis not present

## 2021-12-11 DIAGNOSIS — E089 Diabetes mellitus due to underlying condition without complications: Secondary | ICD-10-CM | POA: Diagnosis present

## 2021-12-11 DIAGNOSIS — F17200 Nicotine dependence, unspecified, uncomplicated: Secondary | ICD-10-CM | POA: Diagnosis present

## 2021-12-11 DIAGNOSIS — D638 Anemia in other chronic diseases classified elsewhere: Secondary | ICD-10-CM | POA: Diagnosis present

## 2021-12-11 DIAGNOSIS — C259 Malignant neoplasm of pancreas, unspecified: Secondary | ICD-10-CM | POA: Diagnosis present

## 2021-12-11 DIAGNOSIS — D849 Immunodeficiency, unspecified: Secondary | ICD-10-CM | POA: Diagnosis present

## 2021-12-11 DIAGNOSIS — I959 Hypotension, unspecified: Secondary | ICD-10-CM | POA: Diagnosis not present

## 2021-12-11 DIAGNOSIS — E1365 Other specified diabetes mellitus with hyperglycemia: Secondary | ICD-10-CM | POA: Diagnosis not present

## 2021-12-11 DIAGNOSIS — D6859 Other primary thrombophilia: Secondary | ICD-10-CM | POA: Diagnosis not present

## 2021-12-11 DIAGNOSIS — Z888 Allergy status to other drugs, medicaments and biological substances status: Secondary | ICD-10-CM

## 2021-12-11 DIAGNOSIS — D539 Nutritional anemia, unspecified: Secondary | ICD-10-CM | POA: Diagnosis present

## 2021-12-11 DIAGNOSIS — E8809 Other disorders of plasma-protein metabolism, not elsewhere classified: Secondary | ICD-10-CM | POA: Diagnosis not present

## 2021-12-11 DIAGNOSIS — Z7901 Long term (current) use of anticoagulants: Secondary | ICD-10-CM | POA: Diagnosis not present

## 2021-12-11 DIAGNOSIS — Z9221 Personal history of antineoplastic chemotherapy: Secondary | ICD-10-CM

## 2021-12-11 DIAGNOSIS — D6869 Other thrombophilia: Secondary | ICD-10-CM

## 2021-12-11 DIAGNOSIS — R509 Fever, unspecified: Secondary | ICD-10-CM | POA: Diagnosis not present

## 2021-12-11 DIAGNOSIS — Z6821 Body mass index (BMI) 21.0-21.9, adult: Secondary | ICD-10-CM | POA: Diagnosis not present

## 2021-12-11 DIAGNOSIS — R079 Chest pain, unspecified: Secondary | ICD-10-CM | POA: Diagnosis not present

## 2021-12-11 DIAGNOSIS — Z20822 Contact with and (suspected) exposure to covid-19: Secondary | ICD-10-CM | POA: Diagnosis present

## 2021-12-11 DIAGNOSIS — G4733 Obstructive sleep apnea (adult) (pediatric): Secondary | ICD-10-CM | POA: Diagnosis present

## 2021-12-11 DIAGNOSIS — R6510 Systemic inflammatory response syndrome (SIRS) of non-infectious origin without acute organ dysfunction: Secondary | ICD-10-CM | POA: Diagnosis not present

## 2021-12-11 DIAGNOSIS — D8481 Immunodeficiency due to conditions classified elsewhere: Secondary | ICD-10-CM | POA: Diagnosis not present

## 2021-12-11 DIAGNOSIS — Z86718 Personal history of other venous thrombosis and embolism: Secondary | ICD-10-CM

## 2021-12-11 DIAGNOSIS — E785 Hyperlipidemia, unspecified: Secondary | ICD-10-CM | POA: Diagnosis present

## 2021-12-11 DIAGNOSIS — E440 Moderate protein-calorie malnutrition: Secondary | ICD-10-CM | POA: Diagnosis present

## 2021-12-11 DIAGNOSIS — E039 Hypothyroidism, unspecified: Secondary | ICD-10-CM | POA: Diagnosis present

## 2021-12-11 DIAGNOSIS — E860 Dehydration: Secondary | ICD-10-CM | POA: Diagnosis not present

## 2021-12-11 DIAGNOSIS — E872 Acidosis, unspecified: Secondary | ICD-10-CM | POA: Diagnosis not present

## 2021-12-11 DIAGNOSIS — R7989 Other specified abnormal findings of blood chemistry: Secondary | ICD-10-CM | POA: Diagnosis present

## 2021-12-11 DIAGNOSIS — R634 Abnormal weight loss: Secondary | ICD-10-CM | POA: Diagnosis not present

## 2021-12-11 DIAGNOSIS — Z79890 Hormone replacement therapy (postmenopausal): Secondary | ICD-10-CM

## 2021-12-11 DIAGNOSIS — H9190 Unspecified hearing loss, unspecified ear: Secondary | ICD-10-CM | POA: Diagnosis present

## 2021-12-11 DIAGNOSIS — R627 Adult failure to thrive: Secondary | ICD-10-CM | POA: Diagnosis not present

## 2021-12-11 DIAGNOSIS — Z8719 Personal history of other diseases of the digestive system: Secondary | ICD-10-CM

## 2021-12-11 DIAGNOSIS — Z806 Family history of leukemia: Secondary | ICD-10-CM

## 2021-12-11 DIAGNOSIS — I10 Essential (primary) hypertension: Secondary | ICD-10-CM | POA: Diagnosis not present

## 2021-12-11 DIAGNOSIS — Z8249 Family history of ischemic heart disease and other diseases of the circulatory system: Secondary | ICD-10-CM

## 2021-12-11 DIAGNOSIS — D72829 Elevated white blood cell count, unspecified: Secondary | ICD-10-CM | POA: Diagnosis present

## 2021-12-11 DIAGNOSIS — K8689 Other specified diseases of pancreas: Secondary | ICD-10-CM

## 2021-12-11 DIAGNOSIS — E43 Unspecified severe protein-calorie malnutrition: Secondary | ICD-10-CM | POA: Diagnosis present

## 2021-12-11 DIAGNOSIS — R739 Hyperglycemia, unspecified: Secondary | ICD-10-CM | POA: Diagnosis present

## 2021-12-11 LAB — GLUCOSE, CAPILLARY
Glucose-Capillary: 133 mg/dL — ABNORMAL HIGH (ref 70–99)
Glucose-Capillary: 255 mg/dL — ABNORMAL HIGH (ref 70–99)
Glucose-Capillary: 77 mg/dL (ref 70–99)

## 2021-12-11 LAB — LACTIC ACID, PLASMA
Lactic Acid, Venous: 2.2 mmol/L (ref 0.5–1.9)
Lactic Acid, Venous: 1.8 mmol/L (ref 0.5–1.9)

## 2021-12-11 LAB — CBC WITH DIFFERENTIAL/PLATELET
Abs Immature Granulocytes: 0.04 10*3/uL (ref 0.00–0.07)
Basophils Absolute: 0 10*3/uL (ref 0.0–0.1)
Basophils Relative: 0 %
Eosinophils Absolute: 0 10*3/uL (ref 0.0–0.5)
Eosinophils Relative: 0 %
HCT: 34.7 % — ABNORMAL LOW (ref 39.0–52.0)
Hemoglobin: 11.4 g/dL — ABNORMAL LOW (ref 13.0–17.0)
Immature Granulocytes: 0 %
Lymphocytes Relative: 9 %
Lymphs Abs: 1 10*3/uL (ref 0.7–4.0)
MCH: 34 pg (ref 26.0–34.0)
MCHC: 32.9 g/dL (ref 30.0–36.0)
MCV: 103.6 fL — ABNORMAL HIGH (ref 80.0–100.0)
Monocytes Absolute: 1.2 10*3/uL — ABNORMAL HIGH (ref 0.1–1.0)
Monocytes Relative: 11 %
Neutro Abs: 9.2 10*3/uL — ABNORMAL HIGH (ref 1.7–7.7)
Neutrophils Relative %: 80 %
Platelets: 285 10*3/uL (ref 150–400)
RBC: 3.35 MIL/uL — ABNORMAL LOW (ref 4.22–5.81)
RDW: 17.2 % — ABNORMAL HIGH (ref 11.5–15.5)
WBC: 11.5 10*3/uL — ABNORMAL HIGH (ref 4.0–10.5)
nRBC: 0 % (ref 0.0–0.2)

## 2021-12-11 LAB — COMPREHENSIVE METABOLIC PANEL
ALT: 10 U/L (ref 0–44)
AST: 17 U/L (ref 15–41)
Albumin: 2.2 g/dL — ABNORMAL LOW (ref 3.5–5.0)
Alkaline Phosphatase: 81 U/L (ref 38–126)
Anion gap: 6 (ref 5–15)
BUN: 17 mg/dL (ref 8–23)
CO2: 22 mmol/L (ref 22–32)
Calcium: 7.5 mg/dL — ABNORMAL LOW (ref 8.9–10.3)
Chloride: 108 mmol/L (ref 98–111)
Creatinine, Ser: 1.17 mg/dL (ref 0.61–1.24)
GFR, Estimated: >60 mL/min (ref >60)
Glucose, Bld: 224 mg/dL — ABNORMAL HIGH (ref 70–99)
Potassium: 3.9 mmol/L (ref 3.5–5.1)
Sodium: 136 mmol/L (ref 135–145)
Total Bilirubin: 1 mg/dL (ref 0.3–1.2)
Total Protein: 5.9 g/dL — ABNORMAL LOW (ref 6.5–8.1)

## 2021-12-11 LAB — URINALYSIS, ROUTINE W REFLEX MICROSCOPIC
Bilirubin Urine: NEGATIVE
Glucose, UA: NEGATIVE mg/dL
Ketones, ur: 5 mg/dL — AB
Leukocytes,Ua: NEGATIVE
Nitrite: NEGATIVE
Protein, ur: 30 mg/dL — AB
RBC / HPF: >50 RBC/hpf — ABNORMAL HIGH (ref 0–5)
Specific Gravity, Urine: 1.028 (ref 1.005–1.030)
pH: 5 (ref 5.0–8.0)

## 2021-12-11 LAB — RESP PANEL BY RT-PCR (FLU A&B, COVID) ARPGX2
Influenza A by PCR: NEGATIVE
Influenza B by PCR: NEGATIVE
SARS Coronavirus 2 by RT PCR: NEGATIVE

## 2021-12-11 LAB — PROTIME-INR
INR: 1.1 (ref 0.8–1.2)
Prothrombin Time: 13.6 seconds (ref 11.4–15.2)

## 2021-12-11 LAB — PROCALCITONIN: Procalcitonin: 5.14 ng/mL

## 2021-12-11 LAB — HEMOGLOBIN A1C
Hgb A1c MFr Bld: 5.6 % (ref 4.8–5.6)
Mean Plasma Glucose: 114.02 mg/dL

## 2021-12-11 LAB — HIV ANTIBODY (ROUTINE TESTING W REFLEX): HIV Screen 4th Generation wRfx: NONREACTIVE

## 2021-12-11 LAB — APTT: aPTT: 33 seconds (ref 24–36)

## 2021-12-11 MED ORDER — VANCOMYCIN HCL 1500 MG/300ML IV SOLN
1500.0000 mg | Freq: Once | INTRAVENOUS | Status: AC
  Administered 2021-12-11: 1500 mg via INTRAVENOUS
  Filled 2021-12-11: qty 300

## 2021-12-11 MED ORDER — ONDANSETRON HCL 4 MG PO TABS: 4.0000 mg | ORAL_TABLET | Freq: Four times a day (QID) | ORAL | Status: DC | PRN

## 2021-12-11 MED ORDER — NICOTINE 21 MG/24HR TD PT24: 21.0000 mg | MEDICATED_PATCH | Freq: Every day | TRANSDERMAL | Status: DC | PRN

## 2021-12-11 MED ORDER — PANCRELIPASE (LIP-PROT-AMYL) 36000-114000 UNITS PO CPEP
72000.0000 [IU] | ORAL_CAPSULE | Freq: Three times a day (TID) | ORAL | Status: DC
  Administered 2021-12-11 – 2021-12-13 (×6): 72000 [IU] via ORAL
  Filled 2021-12-11 (×6): qty 2

## 2021-12-11 MED ORDER — OXYCODONE-ACETAMINOPHEN 5-325 MG PO TABS: 1.0000 | ORAL_TABLET | Freq: Four times a day (QID) | ORAL | Status: DC | PRN

## 2021-12-11 MED ORDER — VANCOMYCIN HCL 1500 MG/300ML IV SOLN
1500.0000 mg | INTRAVENOUS | Status: DC
  Administered 2021-12-12 – 2021-12-13 (×2): 1500 mg via INTRAVENOUS
  Filled 2021-12-11 (×2): qty 300

## 2021-12-11 MED ORDER — SODIUM CHLORIDE 0.9 % IV SOLN
2.0000 g | Freq: Once | INTRAVENOUS | Status: AC
  Administered 2021-12-11: 2 g via INTRAVENOUS
  Filled 2021-12-11: qty 12.5

## 2021-12-11 MED ORDER — METRONIDAZOLE 500 MG/100ML IV SOLN
500.0000 mg | Freq: Once | INTRAVENOUS | Status: AC
  Administered 2021-12-11: 500 mg via INTRAVENOUS
  Filled 2021-12-11: qty 100

## 2021-12-11 MED ORDER — ONDANSETRON HCL 4 MG/2ML IJ SOLN: 4.0000 mg | Freq: Four times a day (QID) | INTRAMUSCULAR | Status: DC | PRN

## 2021-12-11 MED ORDER — VANCOMYCIN HCL IN DEXTROSE 1-5 GM/200ML-% IV SOLN
1000.0000 mg | Freq: Once | INTRAVENOUS | Status: DC
  Filled 2021-12-11: qty 200

## 2021-12-11 MED ORDER — ACETAMINOPHEN 650 MG RE SUPP: 650.0000 mg | Freq: Four times a day (QID) | RECTAL | Status: DC | PRN

## 2021-12-11 MED ORDER — LEVOTHYROXINE SODIUM 50 MCG PO TABS
50.0000 ug | ORAL_TABLET | ORAL | Status: DC
  Administered 2021-12-12 – 2021-12-13 (×2): 50 ug via ORAL
  Filled 2021-12-11 (×2): qty 1

## 2021-12-11 MED ORDER — LEVOTHYROXINE SODIUM 25 MCG PO TABS: 25.0000 ug | ORAL_TABLET | ORAL | Status: DC

## 2021-12-11 MED ORDER — BISACODYL 5 MG PO TBEC: 5.0000 mg | DELAYED_RELEASE_TABLET | Freq: Every day | ORAL | Status: DC | PRN

## 2021-12-11 MED ORDER — APIXABAN 5 MG PO TABS
5.0000 mg | ORAL_TABLET | Freq: Two times a day (BID) | ORAL | Status: DC
  Administered 2021-12-11 – 2021-12-13 (×4): 5 mg via ORAL
  Filled 2021-12-11 (×4): qty 1

## 2021-12-11 MED ORDER — SODIUM CHLORIDE 0.9 % IV BOLUS
1000.0000 mL | Freq: Once | INTRAVENOUS | Status: AC
  Administered 2021-12-11: 1000 mL via INTRAVENOUS

## 2021-12-11 MED ORDER — DEXTROMETHORPHAN POLISTIREX ER 30 MG/5ML PO SUER
30.0000 mg | Freq: Two times a day (BID) | ORAL | Status: DC | PRN
Start: 2021-12-11 — End: 2021-12-13

## 2021-12-11 MED ORDER — TRAZODONE HCL 50 MG PO TABS: 25.0000 mg | ORAL_TABLET | Freq: Every evening | ORAL | Status: DC | PRN

## 2021-12-11 MED ORDER — SODIUM CHLORIDE 0.9 % IV SOLN
2.0000 g | Freq: Three times a day (TID) | INTRAVENOUS | Status: DC
  Administered 2021-12-11 – 2021-12-13 (×6): 2 g via INTRAVENOUS
  Filled 2021-12-11 (×6): qty 12.5

## 2021-12-11 MED ORDER — INSULIN DETEMIR 100 UNIT/ML ~~LOC~~ SOLN
8.0000 [IU] | Freq: Every day | SUBCUTANEOUS | Status: DC
  Administered 2021-12-11 – 2021-12-12 (×2): 8 [IU] via SUBCUTANEOUS
  Filled 2021-12-11 (×3): qty 0.08

## 2021-12-11 MED ORDER — INSULIN ASPART 100 UNIT/ML IJ SOLN
3.0000 [IU] | Freq: Three times a day (TID) | INTRAMUSCULAR | Status: DC
Start: 2021-12-11 — End: 2021-12-13
  Administered 2021-12-11 – 2021-12-13 (×3): 3 [IU] via SUBCUTANEOUS

## 2021-12-11 MED ORDER — ACETAMINOPHEN 325 MG PO TABS: 650.0000 mg | ORAL_TABLET | Freq: Four times a day (QID) | ORAL | Status: DC | PRN

## 2021-12-11 MED ORDER — LEVOTHYROXINE SODIUM 50 MCG PO TABS
175.0000 ug | ORAL_TABLET | Freq: Every day | ORAL | Status: DC
  Administered 2021-12-12 – 2021-12-13 (×2): 175 ug via ORAL
  Filled 2021-12-11 (×2): qty 4

## 2021-12-11 MED ORDER — CHLORHEXIDINE GLUCONATE CLOTH 2 % EX PADS
6.0000 | MEDICATED_PAD | Freq: Every day | CUTANEOUS | Status: DC
Start: 2021-12-12 — End: 2021-12-13
  Administered 2021-12-12 – 2021-12-13 (×2): 6 via TOPICAL

## 2021-12-11 MED ORDER — SODIUM CHLORIDE 0.9 % IV BOLUS
2000.0000 mL | Freq: Once | INTRAVENOUS | Status: AC
  Administered 2021-12-11: 2000 mL via INTRAVENOUS

## 2021-12-11 MED ORDER — INSULIN ASPART 100 UNIT/ML IJ SOLN
0.0000 [IU] | Freq: Three times a day (TID) | INTRAMUSCULAR | Status: DC
  Administered 2021-12-11 – 2021-12-12 (×2): 5 [IU] via SUBCUTANEOUS
  Administered 2021-12-13: 1 [IU] via SUBCUTANEOUS

## 2021-12-11 MED ORDER — SODIUM CHLORIDE 0.9 % IV SOLN
INTRAVENOUS | Status: DC
Start: 2021-12-11 — End: 2021-12-13

## 2021-12-11 NOTE — H&P (Addendum)
History and Physical  Arroyo EXB:[id number] DOB: May 22, 1955 DOA: 12/11/2021  PCP: [name], MD  Patient coming from: Home  Level of care: Telemetry  I have personally briefly reviewed patient's old medical records in [place]  Chief Complaint: fever   HPI: Alexander Banks is a 66 year old male with pancreatic adenocarcinoma diagnosed in 2022 when he was hospitalized with DKA and noted to have abnormal LFTs.  He had biliary stenting and underwent staging with EUS to confirm the diagnosi by [name] on 11/30/2020.    He ended up developing cholangitis and was treated with IV antibiotics and his biliary stent was removed.  He has had a port placed on 12/27/2020 and received chemotherapy with [facility] [name].  He reports that he is lost large amounts of weight since diagnosis.  His appetite remains poor.  He has type 2 diabetes mellitus, smoker, hyperlipidemia, hypothyroidism.  Patient reports that he has been having intermittent chills and fever at home.  He has been feeling unwell for the last 24 hours.  He developed a fever of 102 and shaking chills during the night.  His PCP recommended he come to the emergency department for evaluation.  Patient reported that his last chemotherapy treatment was about 6 weeks ago.  He says he has been running low blood pressures for several months and feeling weak for the past several days more than normal.  He was noted to be hypotensive on arrival with a blood pressure of 78/56.  His lactic acid was elevated at 2.2.  His WBC was elevated at 11.5.  His chest x-ray was unrevealing.  Blood cultures were taken and he was started on IV fluids and broad-spectrum antibiotics for SIRS.  [name] was contacted and recommended patient be observed in the hospital for febrile illness.  Review of Systems: Review of Systems  Constitutional:  Positive for chills, diaphoresis, fever, malaise/fatigue and weight  loss.  HENT:  Positive for congestion and hearing loss. Negative for sore throat.   Eyes: Negative.   Respiratory: Negative.    Cardiovascular: Negative.   Gastrointestinal: Negative.   Genitourinary: Negative.   Musculoskeletal: Negative.   Skin:  Negative for itching and rash.  Neurological:  Positive for weakness.  Endo/Heme/Allergies:  Bruises/bleeds easily.  Psychiatric/Behavioral:  Negative for hallucinations and substance abuse. The patient has insomnia.      Past Medical History:  Diagnosis Date   Anemia, iron deficiency    Diabetes mellitus without complication ([place])    on meds   Hearing loss    Bil hearing aids   Hyperlipidemia    Hypertension    Port-A-Cath in place 12/26/2020   Prediabetes    Sleep apnea     Past Surgical History:  Procedure Laterality Date   BILIARY STENT PLACEMENT N/A 11/09/2020   Procedure: BILIARY STENT PLACEMENT - 3 F X 5 CM STENT;  Surgeon: Rogene Houston, MD;  Location: [facility];  Service: Endoscopy;  Laterality: N/A;   BILIARY STENT PLACEMENT N/A 12/27/2020   Procedure: BILIARY STENT PLACEMENT;  Surgeon: Rogene Houston, MD;  Location: [facility];  Service: Endoscopy;  Laterality: N/A;   COLONOSCOPY     ERCP N/A 11/09/2020   Procedure: ENDOSCOPIC RETROGRADE CHOLANGIOPANCREATOGRAPHY (ERCP);  Surgeon: Rogene Houston, MD;  Location: [facility];  Service: Endoscopy;  Laterality: N/A;   ERCP N/A 12/27/2020   Procedure: ENDOSCOPIC RETROGRADE CHOLANGIOPANCREATOGRAPHY (ERCP);  Surgeon: Rogene Houston, MD;  Location:  [facility];  Service: Endoscopy;  Laterality: N/A;   ESOPHAGOGASTRODUODENOSCOPY (EGD) WITH PROPOFOL N/A 11/30/2020   Procedure: ESOPHAGOGASTRODUODENOSCOPY (EGD) WITH PROPOFOL;  Surgeon: Milus Banister, MD;  Location: [facility];  Service: Endoscopy;  Laterality: N/A;   EUS N/A 11/30/2020   Procedure: UPPER ENDOSCOPIC ULTRASOUND (EUS) RADIAL;  Surgeon: Milus Banister, MD;  Location: [facility];  Service: Endoscopy;  Laterality: N/A;    FINE NEEDLE ASPIRATION N/A 11/30/2020   Procedure: FINE NEEDLE ASPIRATION (FNA) LINEAR;  Surgeon: Milus Banister, MD;  Location: [facility];  Service: Endoscopy;  Laterality: N/A;   GIVENS CAPSULE STUDY     IR IMAGING GUIDED PORT INSERTION  12/18/2020   KNEE SURGERY     4 surgeries on left, 2 on right knee   SHOULDER SURGERY     right shoulder   SPHINCTEROTOMY N/A 11/09/2020   Procedure: SHORT BILIARY SPHINCTEROTOMY;  Surgeon: Rogene Houston, MD;  Location: [facility];  Service: Endoscopy;  Laterality: N/A;   WHIPPLE PROCEDURE       reports that he has been smoking cigars. He has never used smokeless tobacco. He reports current alcohol use. He reports that he does not use drugs.  Allergies  Allergen Reactions   Lisinopril Cough    Family History  Problem Relation Age of Onset   Heart attack Father    Heart attack Paternal Grandfather    Leukemia Sister    Heart attack Brother     Prior to Admission medications   Medication Sig Start Date End Date Taking? Authorizing Provider  acetaminophen (TYLENOL) 325 MG tablet Take 650 mg by mouth every 6 (six) hours as needed for fever.   Yes [provider]  apixaban (ELIQUIS) 5 MG TABS tablet Take 2 tablets ('10mg'$ ) twice daily for 7 days, then 1 tablet ('5mg'$ ) twice daily Patient taking differently: Take 5 mg by mouth 2 (two) times daily. 11/23/21  Yes Luking, Elayne Snare, MD  azelastine (ASTELIN) 0.1 % nasal spray Place 2 sprays into both nostrils 2 (two) times daily. 11/23/21  Yes Luking, Elayne Snare, MD  clobetasol cream (TEMOVATE) 6.33 % Apply 1 application. topically 2 (two) times daily. 09/17/21  Yes Derek Jack, MD  insulin detemir (LEVEMIR) 100 UNIT/ML FlexPen Inject 8 Units into the skin at bedtime. 07/12/21  Yes Nida, Marella Chimes, MD  insulin lispro (HUMALOG KWIKPEN) 100 UNIT/ML KwikPen Inject 4-7 Units into the skin 3 (three) times daily before meals. 07/12/21  Yes Cassandria Anger, MD  levothyroxine (SYNTHROID) 175 MCG  tablet TAKE 1 TABLET DAILY Patient taking differently: Take 175 mcg by mouth daily before breakfast. 10/17/21  Yes Luking, Elayne Snare, MD  levothyroxine (SYNTHROID) 25 MCG tablet Take one tablet po daily but on Sunday take 1 tablet Patient taking differently: Take 25-50 mcg by mouth daily. Take two tablet po daily but one on Sunday take 1 tablet 10/23/21  Yes Luking, Elayne Snare, MD  lipase/protease/amylase (CREON) 36000 UNITS CPEP capsule Take 2 capsules (72,000 Units total) by mouth 3 (three) times daily with meals. May also take 1 capsule (36,000 Units total) as needed (with snacks). 05/31/21  Yes [name], MD  oxyCODONE-acetaminophen (PERCOCET/ROXICET) 5-325 MG tablet Take 1 tablet by mouth every 6 (six) hours as needed for severe pain. 05/02/21  Yes Luking, Elayne Snare, MD  sildenafil (VIAGRA) 100 MG tablet TAKE 1/2 TO 1 TABLET BY MOUTH AS NEEDED FOR ERECTILE DYSFUNCTION Patient taking differently: Take 50-100 mg by mouth as needed for erectile dysfunction. 12/03/21  Yes Luking, Elayne Snare, MD  Continuous Blood Gluc Sensor (FREESTYLE LIBRE 3 SENSOR) MISC 1 Piece by Does not apply route every 14 (fourteen) days. Place 1 sensor on the skin every 14 days. Use to check glucose continuously Dx E11.65 10/22/21   Cassandria Anger, MD    Physical Exam: Vitals:   12/11/21 1230 12/11/21 1300 12/11/21 1321 12/11/21 1402  BP: 118/72 111/76  122/79  Pulse: 75 72  73  Resp: '17 13  20  '$ Temp:   98.1 F (36.7 C) 98.2 F (36.8 C)  TempSrc:   Oral Oral  SpO2: 98% 98%  99%  Weight:      Height:        Constitutional: underweight male, cooperative, but somnolent, arousable easily, NAD, calm, looks uncomfortable Eyes: PERRL, lids and conjunctivae normal ENMT: Mucous membranes are dry. Posterior pharynx clear of any exudate or lesions.  Neck: normal, supple, no masses, no thyromegaly Respiratory: clear to auscultation bilaterally, no wheezing, no crackles. Normal respiratory effort. No accessory muscle use.   Cardiovascular: normal s1, s2 sounds, no murmurs / rubs / gallops. No extremity edema. 2+ pedal pulses. No carotid bruits.  Abdomen: no tenderness. No hepatosplenomegaly. Bowel sounds positive.  Musculoskeletal: no clubbing / cyanosis. No joint deformity upper and lower extremities. Good ROM, no contractures. Normal muscle tone.  Skin: no rashes, lesions, ulcers. No induration Neurologic: CN 2-12 grossly intact. Sensation intact, DTR normal. Strength 5/5 in all 4.  Psychiatric: Normal judgment and insight. Alert and oriented x 3. Normal mood.   Labs on Admission: I have personally reviewed following labs and imaging studies  CBC: Recent Labs  Lab 12/11/21 1005  WBC 11.5*  NEUTROABS 9.2*  HGB 11.4*  HCT 34.7*  MCV 103.6*  PLT 742   Basic Metabolic Panel: Recent Labs  Lab 12/11/21 1005  NA 136  K 3.9  CL 108  CO2 22  GLUCOSE 224*  BUN 17  CREATININE 1.17  CALCIUM 7.5*   GFR: Estimated Creatinine Clearance: 68.7 mL/min (by C-G formula based on SCr of 1.17 mg/dL). Liver Function Tests: Recent Labs  Lab 12/11/21 1005  AST 17  ALT 10  ALKPHOS 81  BILITOT 1.0  PROT 5.9*  ALBUMIN 2.2*   No results for input(s): "LIPASE", "AMYLASE" in the last 168 hours. No results for input(s): "AMMONIA" in the last 168 hours. Coagulation Profile: Recent Labs  Lab 12/11/21 1005  INR 1.1   Cardiac Enzymes: No results for input(s): "CKTOTAL", "CKMB", "CKMBINDEX", "TROPONINI" in the last 168 hours. BNP (last 3 results) No results for input(s): "PROBNP" in the last 8760 hours. HbA1C: No results for input(s): "HGBA1C" in the last 72 hours. CBG: Recent Labs  Lab 12/11/21 1412  GLUCAP 133*   Lipid Profile: No results for input(s): "CHOL", "HDL", "LDLCALC", "TRIG", "CHOLHDL", "LDLDIRECT" in the last 72 hours. Thyroid Function Tests: No results for input(s): "TSH", "T4TOTAL", "FREET4", "T3FREE", "THYROIDAB" in the last 72 hours. Anemia Panel: No results for input(s):  "VITAMINB12", "FOLATE", "FERRITIN", "TIBC", "IRON", "RETICCTPCT" in the last 72 hours. Urine analysis:    Component Value Date/Time   COLORURINE AMBER (A) 12/11/2021 1158   APPEARANCEUR CLEAR 12/11/2021 1158   LABSPEC 1.028 12/11/2021 1158   PHURINE 5.0 12/11/2021 1158   GLUCOSEU NEGATIVE 12/11/2021 1158   HGBUR LARGE (A) 12/11/2021 1158   BILIRUBINUR NEGATIVE 12/11/2021 1158   KETONESUR 5 (A) 12/11/2021 1158   PROTEINUR 30 (A) 12/11/2021 1158   NITRITE NEGATIVE 12/11/2021 1158   LEUKOCYTESUR NEGATIVE 12/11/2021 1158  Radiological Exams on Admission: DG Chest Port 1 View  Result Date: 12/11/2021 CLINICAL DATA:  Chest pain EXAM: PORTABLE CHEST 1 VIEW COMPARISON:  Chest x-ray dated April 30, 2021 FINDINGS: The heart size and mediastinal contours are within normal limits. Right chest wall port. Both lungs are clear. The visualized skeletal structures are unremarkable. IMPRESSION: No active disease. Electronically Signed   By: Yetta Glassman M.D.   On: 12/11/2021 10:53    EKG: Independently reviewed.   Assessment/Plan Principal Problem:   Fever of unknown origin Active Problems:   Dehydration   Pancreatic adenocarcinoma (HCC)   Hypotension   SIRS (systemic inflammatory response syndrome) (HCC)   Lactic acidosis   Leukocytosis   Acquired thrombophilia (HCC)   Immunocompromised (HCC)   Hyperlipidemia   Essential hypertension, benign   Obstructive sleep apnea   Hypothyroidism   Macrocytic anemia   Elevated LFTs   Port-A-Cath in place   History of cholangitis   Diabetes mellitus secondary to pancreatic insufficiency (HCC)   Fever and chills   Hyperglycemia   Abnormal weight loss   Hypoalbuminemia   Severe protein-calorie malnutrition    Fever of unknown origin in an immunocompromised cancer patient - no source of infection founds - SIRS found on presentation -Blood cultures x2, follow urine culture -Check procalcitonin -Acetaminophen ordered as needed for fever  symptoms -Continue IV fluid hydration for supportive measures -Plan to recheck chest x-ray in a.m. after adequate hydration -Continue broad-spectrum antibiotic coverage for now - consider port infection if fevers persist   SIRS -Treating supportively with IV fluid hydration -Lactic acidosis has resolved after fluid bolus -Continue broad-spectrum antibiotic coverage -Follow cultures -Hypotension resolved after fluid bolus  Hypotension -Secondary to dehydration, responded favorably to IV fluid bolus -Continue IV fluid hydration overnight  OSA -We will offer CPAP while in hospital  Hypothyroidism -Resume home levothyroxine medication  Macrocytic anemia -Stable from recent testing, follow CBC after hydration  Diabetes mellitus, insulin requiring secondary to pancreatic insufficiency -Resume home basal and bolus insulin coverage and frequent CBG monitoring with SSI coverage  Severe protein calorie malnutrition/abnormal weight loss - Pt says he does not like drinking supplements  - follow oral intake and consider getting a calorie count if not eating well  History of bilateral DVT/Acquired thrombophilia - He is to take apixaban indefinitely pre Dr. Julieta Gutting  Pancreatic adenocarcinoma - reported last chemotherapy 3 weeks ago - will need follow up with [name]  Lactic acidosis - secondary to dehydration - treated with IV fluid and resolved   Elevated D dimer  - possibly from recently diagnosed bilateral DVTs - he has been on apixaban and reports taking consistently not missing doses - did not get CTA at this time as he not having chest pain, SOB or palpitations - if fever persists would go ahead and get CTA chest    DVT prophylaxis: apixaban   Code Status: Full   Family Communication: wife updated at bedside   Disposition Plan: anticipate home   Consults called: telephone call to [name]   Admission status: OBS   Level of care: Telemetry Irwin Brakeman MD [facility] How to contact the [facility] Attending or Consulting provider [place] or covering provider during after hours 7P -7A, for this patient?  Check the care team in [facility] and look for a) attending/consulting [facility] provider listed and b) the [facility] team listed Log into [email or web address] and use [place]'s universal password to access. If you do not have the password, please  contact the hospital operator. Locate the [facility] provider you are looking for under [facility] and page to a number that you can be directly reached. If you still have difficulty reaching the provider, please page the [facility] (Director on Call) for the Hospitalists listed on amion for assistance.   If 7PM-7AM, please contact night-coverage [email or web address] Password [facility]  12/11/2021, 2:17 PM

## 2021-12-11 NOTE — ED Notes (Signed)
Date and time results received: 12/11/21 1030   Test: Lactic Acid Critical Value: 2.2  Name of Provider Notified: Dr. Roderic Palau  Orders Received? Or Actions Taken?: notified

## 2021-12-11 NOTE — Sepsis Progress Note (Signed)
Sepsis protocol monitored by eLink 

## 2021-12-11 NOTE — Telephone Encounter (Signed)
I spoke with the wife this morning.  Patient had fever 102 shaking chills during the night feels bad this morning.  Touched base with oncology.  They do their sepsis work-ups through the ER I touch base with the wife she will bring him to the emergency department I touch base with ER/MD-they will be expecting him for further evaluation of fever and with indwelling Port-A-Cath cancer history and possible sepsis

## 2021-12-11 NOTE — Progress Notes (Signed)
Pharmacy Antibiotic Note  Alexander Banks is a 66 y.o. male admitted on 12/11/2021 with  unknown source of infection .  Pharmacy has been consulted for vancomycin and cefepime dosing.  Plan: Vancomycin 1500 mg IV every 24 hours. Cefepime 2000 mg IV every 8 hours. Monitor labs, c/s, and patient improvement.  Height: '6\' 2"'$  (188 cm) Weight: 76.2 kg (168 lb) IBW/kg (Calculated) : 82.2  Temp (24hrs), Avg:98.1 F (36.7 C), Min:98.1 F (36.7 C), Max:98.1 F (36.7 C)  Recent Labs  Lab 12/11/21 1005  WBC 11.5*  CREATININE 1.17  LATICACIDVEN 2.2*    Estimated Creatinine Clearance: 68.7 mL/min (by C-G formula based on SCr of 1.17 mg/dL).    Allergies  Allergen Reactions   Lisinopril Cough    Antimicrobials this admission: Vanco 7/25 >> Cefepime 7/25 >>   Microbiology results: 7/25 BCx: pending 7/25 UCx: pending    Thank you for allowing pharmacy to be a part of this patient's care.  Ramond Craver 12/11/2021 11:05 AM

## 2021-12-11 NOTE — Hospital Course (Signed)
66 year old male with pancreatic adenocarcinoma diagnosed in 2022 when he was hospitalized with DKA and noted to have abnormal LFTs.  He had biliary stenting and underwent staging with EUS to confirm the diagnosi by [name] on 11/30/2020.    He ended up developing cholangitis and was treated with IV antibiotics and his biliary stent was removed.  He has had a port placed on 12/27/2020 and received chemotherapy with [facility] [name].  He reports that he is lost large amounts of weight since diagnosis.  His appetite remains poor.  He has type 2 diabetes mellitus, smoker, hyperlipidemia, hypothyroidism.  Patient reports that he has been having intermittent chills and fever at home.  He has been feeling unwell for the last 24 hours.  He developed a fever of 102 and shaking chills during the night.  His PCP recommended he come to the emergency department for evaluation.  Patient reported that his last chemotherapy treatment was about 6 weeks ago.  He says he has been running low blood pressures for several months and feeling weak for the past several days more than normal.  He was noted to be hypotensive on arrival with a blood pressure of 78/56.  His lactic acid was elevated at 2.2.  His WBC was elevated at 11.5.  His chest x-ray was unrevealing.  Blood cultures were taken and he was started on IV fluids and broad-spectrum antibiotics for SIRS.  [name] was contacted and recommended patient be observed in the hospital for febrile illness.

## 2021-12-11 NOTE — ED Triage Notes (Signed)
Patient c/o of fever at 4am today took 2 Tylenol tab  '500mg'$ , feeling weak.  Patient reports his BP has been low for several months.. last chemo was 3 weeks ago

## 2021-12-11 NOTE — ED Provider Notes (Signed)
[facility] EMERGENCY DEPARTMENT Provider Note   CSN: [id number] Arrival date & time: 12/11/21  4128     History {Add pertinent medical, surgical, social history, OB history to HPI:1} Chief Complaint  Patient presents with   Fever    Alexander Banks is a 66 y.o. male.  Patient states that he had a fever of 102 this morning at 4 AM.  He had chills.  He has pancreatic cancer and has received chemo but not recently.   Fever      Home Medications Prior to Admission medications   Medication Sig Start Date End Date Taking? Authorizing Provider  acetaminophen (TYLENOL) 325 MG tablet Take 650 mg by mouth every 6 (six) hours as needed for fever.   Yes [provider]  apixaban (ELIQUIS) 5 MG TABS tablet Take 2 tablets ('10mg'$ ) twice daily for 7 days, then 1 tablet ('5mg'$ ) twice daily Patient taking differently: Take 5 mg by mouth 2 (two) times daily. 11/23/21  Yes Luking, Elayne Snare, MD  azelastine (ASTELIN) 0.1 % nasal spray Place 2 sprays into both nostrils 2 (two) times daily. 11/23/21  Yes Luking, Elayne Snare, MD  clobetasol cream (TEMOVATE) 7.86 % Apply 1 application. topically 2 (two) times daily. 09/17/21  Yes Derek Jack, MD  insulin detemir (LEVEMIR) 100 UNIT/ML FlexPen Inject 8 Units into the skin at bedtime. 07/12/21  Yes Nida, Marella Chimes, MD  insulin lispro (HUMALOG KWIKPEN) 100 UNIT/ML KwikPen Inject 4-7 Units into the skin 3 (three) times daily before meals. 07/12/21  Yes Cassandria Anger, MD  levothyroxine (SYNTHROID) 175 MCG tablet TAKE 1 TABLET DAILY Patient taking differently: Take 175 mcg by mouth daily before breakfast. 10/17/21  Yes Luking, Elayne Snare, MD  levothyroxine (SYNTHROID) 25 MCG tablet Take one tablet po daily but on Sunday take 1 tablet Patient taking differently: Take 25-50 mcg by mouth daily. Take two tablet po daily but one on Sunday take 1 tablet 10/23/21  Yes Luking, Elayne Snare, MD  lipase/protease/amylase (CREON) 36000 UNITS CPEP capsule Take 2  capsules (72,000 Units total) by mouth 3 (three) times daily with meals. May also take 1 capsule (36,000 Units total) as needed (with snacks). 05/31/21  Yes [name], MD  oxyCODONE-acetaminophen (PERCOCET/ROXICET) 5-325 MG tablet Take 1 tablet by mouth every 6 (six) hours as needed for severe pain. 05/02/21  Yes Luking, Elayne Snare, MD  sildenafil (VIAGRA) 100 MG tablet TAKE 1/2 TO 1 TABLET BY MOUTH AS NEEDED FOR ERECTILE DYSFUNCTION Patient taking differently: Take 50-100 mg by mouth as needed for erectile dysfunction. 12/03/21  Yes Luking, Elayne Snare, MD  Continuous Blood Gluc Sensor (FREESTYLE LIBRE 3 SENSOR) MISC 1 Piece by Does not apply route every 14 (fourteen) days. Place 1 sensor on the skin every 14 days. Use to check glucose continuously Dx E11.65 10/22/21   Cassandria Anger, MD      Allergies    Lisinopril    Review of Systems   Review of Systems  Constitutional:  Positive for fever.    Physical Exam Updated Vital Signs BP 111/76   Pulse 72   Temp 98.1 F (36.7 C) (Oral)   Resp 13   Ht '6\' 2"'$  (1.88 m)   Wt 76.2 kg   SpO2 98%   BMI 21.57 kg/m  Physical Exam  ED Results / Procedures / Treatments   Labs (all labs ordered are listed, but only abnormal results are displayed) Labs Reviewed  COMPREHENSIVE METABOLIC PANEL - Abnormal; Notable for the following  components:      Result Value   Glucose, Bld 224 (*)    Calcium 7.5 (*)    Total Protein 5.9 (*)    Albumin 2.2 (*)    All other components within normal limits  LACTIC ACID, PLASMA - Abnormal; Notable for the following components:   Lactic Acid, Venous 2.2 (*)    All other components within normal limits  CBC WITH DIFFERENTIAL/PLATELET - Abnormal; Notable for the following components:   WBC 11.5 (*)    RBC 3.35 (*)    Hemoglobin 11.4 (*)    HCT 34.7 (*)    MCV 103.6 (*)    RDW 17.2 (*)    Neutro Abs 9.2 (*)    Monocytes Absolute 1.2 (*)    All other components within normal limits  URINALYSIS, ROUTINE W  REFLEX MICROSCOPIC - Abnormal; Notable for the following components:   Color, Urine AMBER (*)    Hgb urine dipstick LARGE (*)    Ketones, ur 5 (*)    Protein, ur 30 (*)    RBC / HPF >50 (*)    Bacteria, UA RARE (*)    All other components within normal limits  CULTURE, BLOOD (ROUTINE X 2)  CULTURE, BLOOD (ROUTINE X 2)  RESP PANEL BY RT-PCR (FLU A&B, COVID) ARPGX2  URINE CULTURE  LACTIC ACID, PLASMA  PROTIME-INR  APTT    EKG None  Radiology DG Chest Port 1 View  Result Date: 12/11/2021 CLINICAL DATA:  Chest pain EXAM: PORTABLE CHEST 1 VIEW COMPARISON:  Chest x-ray dated April 30, 2021 FINDINGS: The heart size and mediastinal contours are within normal limits. Right chest wall port. Both lungs are clear. The visualized skeletal structures are unremarkable. IMPRESSION: No active disease. Electronically Signed   By: Yetta Glassman M.D.   On: 12/11/2021 10:53    Procedures Procedures  {Document cardiac monitor, telemetry assessment procedure when appropriate:1}  Medications Ordered in ED Medications  ceFEPIme (MAXIPIME) 2 g in sodium chloride 0.9 % 100 mL IVPB (has no administration in time range)  vancomycin (VANCOREADY) IVPB 1500 mg/300 mL (has no administration in time range)  ceFEPIme (MAXIPIME) 2 g in sodium chloride 0.9 % 100 mL IVPB (0 g Intravenous Stopped 12/11/21 1048)  metroNIDAZOLE (FLAGYL) IVPB 500 mg (0 mg Intravenous Stopped 12/11/21 1150)  sodium chloride 0.9 % bolus 2,000 mL (0 mLs Intravenous Stopped 12/11/21 1159)  vancomycin (VANCOREADY) IVPB 1500 mg/300 mL (0 mg Intravenous Stopped 12/11/21 1220)  sodium chloride 0.9 % bolus 1,000 mL (0 mLs Intravenous Stopped 12/11/21 1255)    ED Course/ Medical Decision Making/ A&P  CRITICAL CARE Performed by: Milton Ferguson Total critical care time: 40 minutes Critical care time was exclusive of separately billable procedures and treating other patients. Critical care was necessary to treat or prevent imminent or  life-threatening deterioration. Critical care was time spent personally by me on the following activities: development of treatment plan with patient and/or surrogate as well as nursing, discussions with consultants, evaluation of patient's response to treatment, examination of patient, obtaining history from patient or surrogate, ordering and performing treatments and interventions, ordering and review of laboratory studies, ordering and review of radiographic studies, pulse oximetry and re-evaluation of patient's condition.    Patient with febrile illness lactic acid 2.2 white count 11.5.  I spoke with [name] oncologist and we will get the hospitalist to admit for observation for febrile illness  Medical Decision Making Amount and/or Complexity of Data Reviewed Labs: ordered. Radiology: ordered. ECG/medicine tests: ordered.  Risk Prescription drug management. Decision regarding hospitalization.   Fever and pancreatic cancer.  He will be admitted to medicine  {Document critical care time when appropriate:1} {Document review of labs and clinical decision tools ie heart score, Chads2Vasc2 etc:1}  {Document your independent review of radiology images, and any outside records:1} {Document your discussion with family members, caretakers, and with consultants:1} {Document social determinants of health affecting pt's care:1} {Document your decision making why or why not admission, treatments were needed:1} Final Clinical Impression(s) / ED Diagnoses Final diagnoses:  Fever, unspecified fever cause    Rx / DC Orders ED Discharge Orders     None

## 2021-12-12 DIAGNOSIS — R509 Fever, unspecified: Secondary | ICD-10-CM | POA: Diagnosis present

## 2021-12-12 DIAGNOSIS — Z95828 Presence of other vascular implants and grafts: Secondary | ICD-10-CM | POA: Diagnosis not present

## 2021-12-12 DIAGNOSIS — E872 Acidosis, unspecified: Secondary | ICD-10-CM | POA: Diagnosis present

## 2021-12-12 DIAGNOSIS — R627 Adult failure to thrive: Secondary | ICD-10-CM | POA: Diagnosis present

## 2021-12-12 DIAGNOSIS — I959 Hypotension, unspecified: Secondary | ICD-10-CM | POA: Diagnosis present

## 2021-12-12 DIAGNOSIS — Z6821 Body mass index (BMI) 21.0-21.9, adult: Secondary | ICD-10-CM | POA: Diagnosis not present

## 2021-12-12 DIAGNOSIS — E039 Hypothyroidism, unspecified: Secondary | ICD-10-CM | POA: Diagnosis present

## 2021-12-12 DIAGNOSIS — G4733 Obstructive sleep apnea (adult) (pediatric): Secondary | ICD-10-CM | POA: Diagnosis present

## 2021-12-12 DIAGNOSIS — E785 Hyperlipidemia, unspecified: Secondary | ICD-10-CM | POA: Diagnosis present

## 2021-12-12 DIAGNOSIS — R6510 Systemic inflammatory response syndrome (SIRS) of non-infectious origin without acute organ dysfunction: Secondary | ICD-10-CM | POA: Diagnosis present

## 2021-12-12 DIAGNOSIS — D6859 Other primary thrombophilia: Secondary | ICD-10-CM | POA: Diagnosis present

## 2021-12-12 DIAGNOSIS — E43 Unspecified severe protein-calorie malnutrition: Secondary | ICD-10-CM | POA: Diagnosis present

## 2021-12-12 DIAGNOSIS — D8481 Immunodeficiency due to conditions classified elsewhere: Secondary | ICD-10-CM | POA: Diagnosis present

## 2021-12-12 DIAGNOSIS — E8809 Other disorders of plasma-protein metabolism, not elsewhere classified: Secondary | ICD-10-CM | POA: Diagnosis present

## 2021-12-12 DIAGNOSIS — I10 Essential (primary) hypertension: Secondary | ICD-10-CM | POA: Diagnosis present

## 2021-12-12 DIAGNOSIS — D539 Nutritional anemia, unspecified: Secondary | ICD-10-CM | POA: Diagnosis present

## 2021-12-12 DIAGNOSIS — Z794 Long term (current) use of insulin: Secondary | ICD-10-CM | POA: Diagnosis not present

## 2021-12-12 DIAGNOSIS — E1365 Other specified diabetes mellitus with hyperglycemia: Secondary | ICD-10-CM | POA: Diagnosis present

## 2021-12-12 DIAGNOSIS — H9190 Unspecified hearing loss, unspecified ear: Secondary | ICD-10-CM | POA: Diagnosis present

## 2021-12-12 DIAGNOSIS — E860 Dehydration: Secondary | ICD-10-CM | POA: Diagnosis present

## 2021-12-12 DIAGNOSIS — Z20822 Contact with and (suspected) exposure to covid-19: Secondary | ICD-10-CM | POA: Diagnosis present

## 2021-12-12 DIAGNOSIS — C259 Malignant neoplasm of pancreas, unspecified: Secondary | ICD-10-CM | POA: Diagnosis present

## 2021-12-12 DIAGNOSIS — Z7901 Long term (current) use of anticoagulants: Secondary | ICD-10-CM | POA: Diagnosis not present

## 2021-12-12 DIAGNOSIS — F17200 Nicotine dependence, unspecified, uncomplicated: Secondary | ICD-10-CM | POA: Diagnosis present

## 2021-12-12 LAB — BASIC METABOLIC PANEL
Anion gap: 2 — ABNORMAL LOW (ref 5–15)
BUN: 16 mg/dL (ref 8–23)
CO2: 24 mmol/L (ref 22–32)
Calcium: 7.4 mg/dL — ABNORMAL LOW (ref 8.9–10.3)
Chloride: 114 mmol/L — ABNORMAL HIGH (ref 98–111)
Creatinine, Ser: 0.79 mg/dL (ref 0.61–1.24)
GFR, Estimated: >60 mL/min (ref >60)
Glucose, Bld: 77 mg/dL (ref 70–99)
Potassium: 3.9 mmol/L (ref 3.5–5.1)
Sodium: 140 mmol/L (ref 135–145)

## 2021-12-12 LAB — CBC WITH DIFFERENTIAL/PLATELET
Abs Immature Granulocytes: 0.03 10*3/uL (ref 0.00–0.07)
Basophils Absolute: 0.1 10*3/uL (ref 0.0–0.1)
Basophils Relative: 1 %
Eosinophils Absolute: 0.2 10*3/uL (ref 0.0–0.5)
Eosinophils Relative: 2 %
HCT: 30.9 % — ABNORMAL LOW (ref 39.0–52.0)
Hemoglobin: 9.9 g/dL — ABNORMAL LOW (ref 13.0–17.0)
Immature Granulocytes: 0 %
Lymphocytes Relative: 21 %
Lymphs Abs: 1.6 10*3/uL (ref 0.7–4.0)
MCH: 33.3 pg (ref 26.0–34.0)
MCHC: 32 g/dL (ref 30.0–36.0)
MCV: 104 fL — ABNORMAL HIGH (ref 80.0–100.0)
Monocytes Absolute: 1.2 10*3/uL — ABNORMAL HIGH (ref 0.1–1.0)
Monocytes Relative: 16 %
Neutro Abs: 4.4 10*3/uL (ref 1.7–7.7)
Neutrophils Relative %: 60 %
Platelets: 259 10*3/uL (ref 150–400)
RBC: 2.97 MIL/uL — ABNORMAL LOW (ref 4.22–5.81)
RDW: 17 % — ABNORMAL HIGH (ref 11.5–15.5)
WBC: 7.3 10*3/uL (ref 4.0–10.5)
nRBC: 0 % (ref 0.0–0.2)

## 2021-12-12 LAB — GLUCOSE, CAPILLARY
Glucose-Capillary: 102 mg/dL — ABNORMAL HIGH (ref 70–99)
Glucose-Capillary: 109 mg/dL — ABNORMAL HIGH (ref 70–99)
Glucose-Capillary: 176 mg/dL — ABNORMAL HIGH (ref 70–99)
Glucose-Capillary: 275 mg/dL — ABNORMAL HIGH (ref 70–99)
Glucose-Capillary: 79 mg/dL (ref 70–99)
Glucose-Capillary: 95 mg/dL (ref 70–99)

## 2021-12-12 LAB — URINE CULTURE: Culture: NO GROWTH

## 2021-12-12 LAB — PROCALCITONIN: Procalcitonin: 5.09 ng/mL

## 2021-12-12 LAB — MAGNESIUM: Magnesium: 1.7 mg/dL (ref 1.7–2.4)

## 2021-12-12 NOTE — Progress Notes (Signed)
PROGRESS NOTE     Alexander Banks, is a 66 y.o. male, DOB - 14-Jan-1956, TZG:[id number]  Admit date - 12/11/2021   Admitting Physician Murlean Iba, MD  Outpatient Primary MD for the patient is [name], MD  LOS - 0  Chief Complaint  Patient presents with   Fever        Brief Narrative:   66 year old male with pancreatic adenocarcinoma diagnosed in 2022 when he was hospitalized with DKA and noted to have abnormal LFTs.  He had biliary stenting and underwent staging with EUS to confirm the diagnosi by [name] on 11/30/2020.    He ended up developing cholangitis and was treated with IV antibiotics and his biliary stent was removed.  He has had a port placed on 12/27/2020 and received chemotherapy with [facility] [name].  He reports that he is lost large amounts of weight since diagnosis.  His appetite remains poor.  He has type 2 diabetes mellitus, smoker, hyperlipidemia, hypothyroidism.  Patient reports that he has been having intermittent chills and fever at home.  He has been feeling unwell for the last 24 hours.  He developed a fever of 102 and shaking chills during the night.  His PCP recommended he come to the emergency department for evaluation.  Patient reported that his last chemotherapy treatment was about 6 weeks ago.  He says he has been running low blood pressures for several months and feeling weak for the past several days more than normal.  He was noted to be hypotensive on arrival with a blood pressure of 78/56.  His lactic acid was elevated at 2.2.  His WBC was elevated at 11.5.  His chest x-ray was unrevealing.  Blood cultures were taken and he was started on IV fluids and broad-spectrum antibiotics for SIRS.  [name] was contacted and recommended patient be observed in the hospital for febrile illness.    -Assessment and Plan:  1)FUO--patient is immunocompromised with underlying malignancy and undergoing chemotherapy -- No source  identified -Currently afebrile Met SIRs criteria on admission Continue Vanco and cefepime pending further culture data -Consider discharge home over the next day or 2 if remains afebrile and if culture data remains negative  2) history of prior Bil DVT--as per hematologist [name] patient is to take anticoagulation indefinitely currently on Eliquis  3) pancreatic adenocarcinoma--- currently undergoing chemotherapy by [name]  4) hypertension/dehydration/lactic acidosis----clinically improved with hydration/IV fluids  5) severe protein caloric malnutrition/FTT--- continue nutritional supplements  6)DM 1.5--- due to pancreatic insufficiency in the setting of pancreatic malignancy Use Novolog/Humalog Sliding scale insulin with Accu-Cheks/Fingersticks as ordered   7)Hypothyroidism--- continue levothyroxine  8)OSA--- continue CPAP nightly  Disposition/Need for in-Hospital Stay- patient unable to be discharged at this time due to dehydration and hypotension in the setting of SIRS/FUO--- requiring IV fluids IV antibiotics pending blood culture data*  Disposition: The patient is from: Home              Anticipated d/c is to: Home              Anticipated d/c date is: 2 days              Patient currently is not medically stable to d/c. Barriers: Not Clinically Stable-   Code Status :  -  Code Status: Full Code   Family Communication: Discussed with wife at bedside DVT Prophylaxis  :   - SCDs    apixaban (ELIQUIS) tablet 5 mg  Lab Results  Component Value Date   PLT 259 12/12/2021    Inpatient Medications  Scheduled Meds:  apixaban  5 mg Oral BID   Chlorhexidine Gluconate Cloth  6 each Topical Daily   insulin aspart  0-9 Units Subcutaneous TID WC   insulin aspart  3 Units Subcutaneous TID WC   insulin detemir  8 Units Subcutaneous QHS   levothyroxine  175 mcg Oral QAC breakfast   [START ON 12/16/2021] levothyroxine  25 mcg Oral Once per day on Sun    levothyroxine  50 mcg Oral Once per day on Mon Tue Wed Thu Fri Sat   lipase/protease/amylase  72,000 Units Oral TID AC   Continuous Infusions:  sodium chloride 70 mL/hr at 12/12/21 0640   ceFEPime (MAXIPIME) IV 2 g (12/12/21 1716)   vancomycin 1,500 mg (12/12/21 0910)   PRN Meds:.acetaminophen **OR** acetaminophen, bisacodyl, dextromethorphan, nicotine, ondansetron **OR** ondansetron (ZOFRAN) IV, oxyCODONE-acetaminophen, traZODone   Anti-infectives (From admission, onward)    Start     Dose/Rate Route Frequency Ordered Stop   12/12/21 1000  vancomycin (VANCOREADY) IVPB 1500 mg/300 mL        1,500 mg 150 mL/hr over 120 Minutes Intravenous Every 24 hours 12/11/21 1053     12/11/21 1800  ceFEPIme (MAXIPIME) 2 g in sodium chloride 0.9 % 100 mL IVPB        2 g 200 mL/hr over 30 Minutes Intravenous Every 8 hours 12/11/21 1050     12/11/21 1015  vancomycin (VANCOREADY) IVPB 1500 mg/300 mL        1,500 mg 150 mL/hr over 120 Minutes Intravenous  Once 12/11/21 1001 12/11/21 1220   12/11/21 1000  ceFEPIme (MAXIPIME) 2 g in sodium chloride 0.9 % 100 mL IVPB        2 g 200 mL/hr over 30 Minutes Intravenous  Once 12/11/21 0949 12/11/21 1048   12/11/21 1000  metroNIDAZOLE (FLAGYL) IVPB 500 mg        500 mg 100 mL/hr over 60 Minutes Intravenous  Once 12/11/21 0949 12/11/21 1150   12/11/21 1000  vancomycin (VANCOCIN) IVPB 1000 mg/200 mL premix  Status:  Discontinued        1,000 mg 200 mL/hr over 60 Minutes Intravenous  Once 12/11/21 0949 12/11/21 1001       Subjective: [name] today has no further fevers, no emesis,  No chest pain,   -Wife at bedside, questions answered  Objective: Vitals:   12/11/21 1820 12/11/21 2300 12/12/21 0100 12/12/21 1536  BP: 120/74 111/72 115/70 123/72  Pulse: 74 86 75 84  Resp: _0 Temp: 98.4 F (36.9 C) 97.7 F (36.5 C) 97.6 F (36.4 C) 98.3 F (36.8 C)  TempSrc: Oral Oral Oral Oral  SpO2: 99% 97% 100% 99%  Weight:      Height:         Intake/Output Summary (Last 24 hours) at 12/12/2021 1803 Last data filed at 12/12/2021 1500 Gross per 24 hour  Intake 1702.03 ml  Output --  Net 1702.03 ml   Filed Weights   12/11/21 0936  Weight: 76.2 kg    Physical Exam  Gen:- Awake Alert, chronically ill-appearing HEENT:- [place].AT, No sclera icterus Neck-Supple Neck,No JVD,.  Lungs-  CTAB , fair symmetrical air movement CV- S1, S2 normal, regular , right-sided Port-A-Cath in situ Abd-  +ve B.Sounds, Abd Soft, No tenderness,    Extremity/Skin:- No  edema, pedal pulses present  Psych-affect is appropriate, oriented x3 Neuro-no new focal deficits,  no tremors  Data Reviewed: I have personally reviewed following labs and imaging studies  CBC: Recent Labs  Lab 12/11/21 1005 12/12/21 0557  WBC 11.5* 7.3  NEUTROABS 9.2* 4.4  HGB 11.4* 9.9*  HCT 34.7* 30.9*  MCV 103.6* 104.0*  PLT 285 659   Basic Metabolic Panel: Recent Labs  Lab 12/11/21 1005 12/12/21 0557  NA 136 140  K 3.9 3.9  CL 108 114*  CO2 22 24  GLUCOSE 224* 77  BUN 17 16  CREATININE 1.17 0.79  CALCIUM 7.5* 7.4*  MG  --  1.7   GFR: Estimated Creatinine Clearance: 100.5 mL/min (by C-G formula based on SCr of 0.79 mg/dL). Liver Function Tests: Recent Labs  Lab 12/11/21 1005  AST 17  ALT 10  ALKPHOS 81  BILITOT 1.0  PROT 5.9*  ALBUMIN 2.2*   Cardiac Enzymes: No results for input(s): "CKTOTAL", "CKMB", "CKMBINDEX", "TROPONINI" in the last 168 hours. BNP (last 3 results) No results for input(s): "PROBNP" in the last 8760 hours. HbA1C: Recent Labs    12/11/21 1432  HGBA1C 5.6   Sepsis Labs: _0 (procalcitonin:4,lacticidven:4) ) Recent Results (from the past 240 hour(s))  Culture, blood (Routine x 2)     Status: None (Preliminary result)   Collection Time: 12/11/21  9:49 AM   Specimen: Right Antecubital; Blood  Result Value Ref Range Status   Specimen Description RIGHT ANTECUBITAL  Final   Special Requests   Final    BOTTLES  DRAWN AEROBIC AND ANAEROBIC Blood Culture adequate volume   Culture   Final    NO GROWTH < 24 HOURS Performed at [facility], [address] [place], [place] [address]    Report Status PENDING  Incomplete  Resp Panel by RT-PCR (Flu A&B, Covid) Anterior Nasal Swab     Status: None   Collection Time: 12/11/21  9:49 AM   Specimen: Anterior Nasal Swab  Result Value Ref Range Status   SARS Coronavirus 2 by RT PCR NEGATIVE NEGATIVE Final    Comment: (NOTE) SARS-CoV-2 target nucleic acids are NOT DETECTED.  The SARS-CoV-2 RNA is generally detectable in upper respiratory specimens during the acute phase of infection. The lowest concentration of SARS-CoV-2 viral copies this assay can detect is 138 copies/mL. A negative result does not preclude SARS-Cov-2 infection and should not be used as the sole basis for treatment or other patient management decisions. A negative result may occur with  improper specimen collection/handling, submission of specimen other than nasopharyngeal swab, presence of viral mutation(s) within the areas targeted by this assay, and inadequate number of viral copies(<138 copies/mL). A negative result must be combined with clinical observations, patient history, and epidemiological information. The expected result is Negative.  Fact Sheet for Patients:  EntrepreneurPulse.com.au  Fact Sheet for Healthcare Providers:  IncredibleEmployment.be  This test is no t yet approved or cleared by the [place] FDA and  has been authorized for detection and/or diagnosis of SARS-CoV-2 by FDA under an Emergency Use Authorization (EUA). This EUA will remain  in effect (meaning this test can be used) for the duration of the COVID-19 declaration under Section 564(b)(1) of the Act, 21 U.S.C.section 360bbb-3(b)(1), unless the authorization is terminated  or revoked sooner.       Influenza A by PCR NEGATIVE NEGATIVE Final   Influenza B by  PCR NEGATIVE NEGATIVE Final    Comment: (NOTE) The Xpert Xpress SARS-CoV-2/FLU/RSV plus assay is intended as an aid in the diagnosis of influenza from Nasopharyngeal swab specimens and should not  be used as a sole basis for treatment. Nasal washings and aspirates are unacceptable for Xpert Xpress SARS-CoV-2/FLU/RSV testing.  Fact Sheet for Patients: EntrepreneurPulse.com.au  Fact Sheet for Healthcare Providers: IncredibleEmployment.be  This test is not yet approved or cleared by the [place] FDA and has been authorized for detection and/or diagnosis of SARS-CoV-2 by FDA under an Emergency Use Authorization (EUA). This EUA will remain in effect (meaning this test can be used) for the duration of the COVID-19 declaration under Section 564(b)(1) of the Act, 21 U.S.C. section 360bbb-3(b)(1), unless the authorization is terminated or revoked.  Performed at [facility], [address], [place], [place] [address]   Urine Culture     Status: None   Collection Time: 12/11/21  9:49 AM   Specimen: Urine, Catheterized  Result Value Ref Range Status   Specimen Description   Final    URINE, CATHETERIZED Performed at [facility], [address] [place], [place] [address]    Special Requests   Final    NONE Performed at [facility], [address], [place], [place] [address]    Culture   Final    NO GROWTH Performed at [facility], [place] [address], [place], [place] [address]    Report Status 12/12/2021 FINAL  Final  Culture, blood (Routine x 2)     Status: None (Preliminary result)   Collection Time: 12/11/21 10:06 AM   Specimen: BLOOD LEFT ARM  Result Value Ref Range Status   Specimen Description BLOOD LEFT ARM  Final   Special Requests   Final    BOTTLES DRAWN AEROBIC AND ANAEROBIC Blood Culture results may not be optimal due to an excessive volume of blood received in culture bottles   Culture   Final    NO GROWTH < 24  HOURS Performed at [facility], [address] [place], [place] [address]    Report Status PENDING  Incomplete      Radiology Studies: DG Chest Port 1 View  Result Date: 12/11/2021 CLINICAL DATA:  Chest pain EXAM: PORTABLE CHEST 1 VIEW COMPARISON:  Chest x-ray dated April 30, 2021 FINDINGS: The heart size and mediastinal contours are within normal limits. Right chest wall port. Both lungs are clear. The visualized skeletal structures are unremarkable. IMPRESSION: No active disease. Electronically Signed   By: Yetta Glassman M.D.   On: 12/11/2021 10:53     Scheduled Meds:  apixaban  5 mg Oral BID   Chlorhexidine Gluconate Cloth  6 each Topical Daily   insulin aspart  0-9 Units Subcutaneous TID WC   insulin aspart  3 Units Subcutaneous TID WC   insulin detemir  8 Units Subcutaneous QHS   levothyroxine  175 mcg Oral QAC breakfast   [START ON 12/16/2021] levothyroxine  25 mcg Oral Once per day on Sun   levothyroxine  50 mcg Oral Once per day on Mon Tue Wed Thu Fri Sat   lipase/protease/amylase  72,000 Units Oral TID AC   Continuous Infusions:  sodium chloride 70 mL/hr at 12/12/21 0640   ceFEPime (MAXIPIME) IV 2 g (12/12/21 1716)   vancomycin 1,500 mg (12/12/21 0910)     LOS: 0 days    Roxan Hockey M.D on 12/12/2021 at 6:03 PM  Go to [email or web address] - for contact info  [facility] - Office  [phone]  If 7PM-7AM, please contact night-coverage [email or web address] 12/12/2021, 6:03 PM

## 2021-12-12 NOTE — TOC Progression Note (Signed)
  Transition of Care [facility]) Screening Note   Patient Details  Name: WAGNER TANZI Date of Birth: 06-18-1955   Transition of Care [facility]) CM/SW Contact:    Boneta Lucks, RN Phone Number: 12/12/2021, 1:04 PM    Transition of Care Department [facility]) has reviewed patient and no TOC needs have been identified at this time. We will continue to monitor patient advancement through interdisciplinary progression rounds. If new patient transition needs arise, please place a TOC consult.      Barriers to Discharge: Continued Medical Work up  Expected Discharge Plan and Services      Living arrangements for the past 2 months: [place]

## 2021-12-12 NOTE — Inpatient Diabetes Management (Signed)
Inpatient Diabetes Program Recommendations  AACE/ADA: New Consensus Statement on Inpatient Glycemic Control   Target Ranges:  Prepandial:   less than 140 mg/dL      Peak postprandial:   less than 180 mg/dL (1-2 hours)      Critically ill patients:  140 - 180 mg/dL    Latest Reference Range & Units 12/12/21 00:53 12/12/21 03:02 12/12/21 07:52  Glucose-Capillary 70 - 99 mg/dL 95 102 (H) 79    Latest Reference Range & Units 12/11/21 14:12 12/11/21 17:31 12/11/21 23:01  Glucose-Capillary 70 - 99 mg/dL 133 (H) 255 (H) 77   Review of Glycemic Control  Diabetes history: DM2 Outpatient Diabetes medications: Levemir 8 units QHS, Humalog 4-7 units TID with meals Current orders for Inpatient glycemic control: Levemir 8 units QHS, Novolog 0-9 units TID with meals, Novolog 3 units TID with meals  Inpatient Diabetes Program Recommendations:    Insulin: Fasting glucose 79 mg/dl today. Please consider decreasing Levemir to 4 units QHS.  Thanks, Barnie Alderman, RN, MSN, [facility] Coordinator Inpatient Diabetes Program [phone] (Team Pager from 8am to [place])

## 2021-12-13 DIAGNOSIS — R509 Fever, unspecified: Secondary | ICD-10-CM | POA: Diagnosis not present

## 2021-12-13 DIAGNOSIS — R6510 Systemic inflammatory response syndrome (SIRS) of non-infectious origin without acute organ dysfunction: Secondary | ICD-10-CM

## 2021-12-13 LAB — GLUCOSE, CAPILLARY
Glucose-Capillary: 136 mg/dL — ABNORMAL HIGH (ref 70–99)
Glucose-Capillary: 130 mg/dL — ABNORMAL HIGH (ref 70–99)
Glucose-Capillary: 89 mg/dL (ref 70–99)

## 2021-12-13 LAB — CORTISOL-AM, BLOOD: Cortisol - AM: 12 ug/dL (ref 6.7–22.6)

## 2021-12-13 MED ORDER — HEPARIN SOD (PORK) LOCK FLUSH 100 UNIT/ML IV SOLN
500.0000 [IU] | Freq: Once | INTRAVENOUS | Status: AC
Start: 2021-12-13 — End: 2021-12-13
  Administered 2021-12-13: 500 [IU] via INTRAVENOUS
  Filled 2021-12-13: qty 5

## 2021-12-13 MED ORDER — CEFDINIR 300 MG PO CAPS: 300.0000 mg | ORAL_CAPSULE | Freq: Two times a day (BID) | ORAL | 0 refills | Status: AC

## 2021-12-13 MED ORDER — SODIUM CHLORIDE 0.9 % IV SOLN: INTRAVENOUS | Status: DC

## 2021-12-13 NOTE — Discharge Instructions (Signed)
1)Please call [phone] on Friday 12/14/21 between 3 PM and 6 PM to get updated report of your final Blood culture 2) please keep your appointment with your oncologist [name] for recheck and reevaluation and also to repeat your CBC blood work

## 2021-12-13 NOTE — Progress Notes (Signed)
Patient stable and ready fr discharge home with wife at bedside. Writer removed IV and RN deaccessed patient's right chest port. Writer went over discharge paperwork with patient and spouse and both verbalized understanding. No issues at this time NT transported patienti via [facility] to wife's car. Patient's belongings packed by patient's wife.

## 2021-12-13 NOTE — Discharge Summary (Signed)
Alexander Banks, is a 66 y.o. male  DOB 09/21/1955  MRN [id number].  Admission date:  12/11/2021  Admitting Physician  Roxan Hockey, MD  Discharge Date:  12/13/2021   Primary MD  [name], MD  Recommendations for primary care physician for things to follow:    1)Please call [phone] on Friday 12/14/21 between 3 PM and 6 PM to get updated report of your final Blood culture 2) please keep your appointment with your oncologist [name] for recheck and reevaluation and also to repeat your CBC blood work   Admission Diagnosis  Fever of unknown origin [R50.9] Fever, unspecified fever cause [R50.9] SIRS (systemic inflammatory response syndrome) ([place]) [R65.10]   Discharge Diagnosis  Fever of unknown origin [R50.9] Fever, unspecified fever cause [R50.9] SIRS (systemic inflammatory response syndrome) ([place]) [R65.10]    Principal Problem:   Fever of unknown origin Active Problems:   Lactic acidosis   Leukocytosis   Acquired thrombophilia ([place])   Hyperlipidemia   Essential hypertension, benign   Obstructive sleep apnea   Hypothyroidism   Macrocytic anemia   Dehydration   Elevated LFTs   Pancreatic adenocarcinoma ([place])   Port-A-Cath in place   History of cholangitis   Diabetes mellitus secondary to pancreatic insufficiency (HCC)   Fever and chills   Hypotension   SIRS (systemic inflammatory response syndrome) (HCC)   Hyperglycemia   Abnormal weight loss   Hypoalbuminemia   Severe protein-calorie malnutrition   Immunocompromised (HCC)      Past Medical History:  Diagnosis Date   Anemia, iron deficiency    Diabetes mellitus without complication ([place])    on meds   Hearing loss    Bil hearing aids   Hyperlipidemia    Hypertension    Port-A-Cath in place 12/26/2020   Prediabetes    Sleep apnea     Past Surgical History:  Procedure Laterality Date   BILIARY STENT PLACEMENT  N/A 11/09/2020   Procedure: BILIARY STENT PLACEMENT - 63 F X 5 CM STENT;  Surgeon: Rogene Houston, MD;  Location: [facility];  Service: Endoscopy;  Laterality: N/A;   BILIARY STENT PLACEMENT N/A 12/27/2020   Procedure: BILIARY STENT PLACEMENT;  Surgeon: Rogene Houston, MD;  Location: [facility];  Service: Endoscopy;  Laterality: N/A;   COLONOSCOPY     ERCP N/A 11/09/2020   Procedure: ENDOSCOPIC RETROGRADE CHOLANGIOPANCREATOGRAPHY (ERCP);  Surgeon: Rogene Houston, MD;  Location: [facility];  Service: Endoscopy;  Laterality: N/A;   ERCP N/A 12/27/2020   Procedure: ENDOSCOPIC RETROGRADE CHOLANGIOPANCREATOGRAPHY (ERCP);  Surgeon: Rogene Houston, MD;  Location: [facility];  Service: Endoscopy;  Laterality: N/A;   ESOPHAGOGASTRODUODENOSCOPY (EGD) WITH PROPOFOL N/A 11/30/2020   Procedure: ESOPHAGOGASTRODUODENOSCOPY (EGD) WITH PROPOFOL;  Surgeon: Milus Banister, MD;  Location: [facility];  Service: Endoscopy;  Laterality: N/A;   EUS N/A 11/30/2020   Procedure: UPPER ENDOSCOPIC ULTRASOUND (EUS) RADIAL;  Surgeon: Milus Banister, MD;  Location: [facility];  Service: Endoscopy;  Laterality: N/A;   FINE NEEDLE ASPIRATION N/A 11/30/2020  Procedure: FINE NEEDLE ASPIRATION (FNA) LINEAR;  Surgeon: Milus Banister, MD;  Location: [facility];  Service: Endoscopy;  Laterality: N/A;   GIVENS CAPSULE STUDY     IR IMAGING GUIDED PORT INSERTION  12/18/2020   KNEE SURGERY     4 surgeries on left, 2 on right knee   SHOULDER SURGERY     right shoulder   SPHINCTEROTOMY N/A 11/09/2020   Procedure: SHORT BILIARY SPHINCTEROTOMY;  Surgeon: Rogene Houston, MD;  Location: [facility];  Service: Endoscopy;  Laterality: N/A;   WHIPPLE PROCEDURE       HPI  from the history and physical done on the day of admission:    Chief Complaint: fever    HPI: Alexander Banks is a 66 year old male with pancreatic adenocarcinoma diagnosed in 2022 when he was hospitalized with DKA and noted to have abnormal LFTs.  He had biliary  stenting and underwent staging with EUS to confirm the diagnosi by [name] on 11/30/2020.    He ended up developing cholangitis and was treated with IV antibiotics and his biliary stent was removed.  He has had a port placed on 12/27/2020 and received chemotherapy with [facility] [name].  He reports that he is lost large amounts of weight since diagnosis.  His appetite remains poor.  He has type 2 diabetes mellitus, smoker, hyperlipidemia, hypothyroidism.   Patient reports that he has been having intermittent chills and fever at home.  He has been feeling unwell for the last 24 hours.  He developed a fever of 102 and shaking chills during the night.  His PCP recommended he come to the emergency department for evaluation.   Patient reported that his last chemotherapy treatment was about 6 weeks ago.  He says he has been running low blood pressures for several months and feeling weak for the past several days more than normal.  He was noted to be hypotensive on arrival with a blood pressure of 78/56.  His lactic acid was elevated at 2.2.  His WBC was elevated at 11.5.  His chest x-ray was unrevealing.  Blood cultures were taken and he was started on IV fluids and broad-spectrum antibiotics for SIRS.  [name] was contacted and recommended patient be observed in the hospital for febrile illness.     Hospital Course:     66 year old male with pancreatic adenocarcinoma diagnosed in 2022 when he was hospitalized with DKA and noted to have abnormal LFTs.  He had biliary stenting and underwent staging with EUS to confirm the diagnosi by [name] on 11/30/2020.    He ended up developing cholangitis and was treated with IV antibiotics and his biliary stent was removed.  He has had a port placed on 12/27/2020 and received chemotherapy with [facility] [name].  He reports that he is lost large amounts of weight since diagnosis.  His appetite remains poor.  He has type 2 diabetes  mellitus, smoker, hyperlipidemia, hypothyroidism.  Patient reports that he has been having intermittent chills and fever at home.  He has been feeling unwell for the last 24 hours.  He developed a fever of 102 and shaking chills during the night.  His PCP recommended he come to the emergency department for evaluation.  Patient reported that his last chemotherapy treatment was about 6 weeks ago.  He says he has been running low blood pressures for several months and feeling weak for the past several days more than normal.  He was noted  to be hypotensive on arrival with a blood pressure of 78/56.  His lactic acid was elevated at 2.2.  His WBC was elevated at 11.5.  His chest x-ray was unrevealing.  Blood cultures were taken and he was started on IV fluids and broad-spectrum antibiotics for SIRS.  [name] was contacted and recommended patient be observed in the hospital for febrile illness.  Assessment and Plan:  1)FUO--patient is immunocompromised with underlying malignancy and undergoing chemotherapy -- No source identified Met SIRs criteria on admission -Treated with Vanco and cefepime  -Blood and urine cultures NGTD -Flu/ influenza/COVID-negative  -Currently afebrile -Okay to discharge home on Omnicef -Call or return if fevers return  2) history of prior Bil DVT--as per hematologist [name] patient is to take anticoagulation indefinitely currently on Eliquis   3) pancreatic adenocarcinoma--- currently undergoing chemotherapy by [name]   4) hypertension/dehydration/lactic acidosis----clinically improved with hydration/IV fluids   5) severe protein caloric malnutrition/FTT--- continue nutritional supplements   6)DM 1.5--- due to pancreatic insufficiency in the setting of pancreatic malignancy -Continue PTA regimen   7)Hypothyroidism--- continue levothyroxine   8)OSA--- continue CPAP nightly   Disposition: The patient is from: Home              Anticipated d/c is  to: Home Discharge Condition: -Stable and afebrile  Follow UP -- [name]  Diet and Activity recommendation:  As advised  Discharge Instructions   * Discharge Instructions     Call MD for:  difficulty breathing, headache or visual disturbances   Complete by: As directed    Call MD for:  persistant dizziness or light-headedness   Complete by: As directed    Call MD for:  persistant nausea and vomiting   Complete by: As directed    Call MD for:  severe uncontrolled pain   Complete by: As directed    Call MD for:  temperature >100.4   Complete by: As directed    Diet general   Complete by: As directed    Discharge instructions   Complete by: As directed    1)Please call [phone] on Friday 12/14/21 between 3 PM and 6 PM to get updated report of your final Blood culture 2) please keep your appointment with your oncologist [name] for recheck and reevaluation and also to repeat your CBC blood work   Increase activity slowly   Complete by: As directed          Discharge Medications     Allergies as of 12/13/2021       Reactions   Lisinopril Cough        Medication List     TAKE these medications    acetaminophen 325 MG tablet Commonly known as: TYLENOL Take 650 mg by mouth every 6 (six) hours as needed for fever.   apixaban 5 MG Tabs tablet Commonly known as: Eliquis Take 2 tablets (17m) twice daily for 7 days, then 1 tablet (517m twice daily What changed:  how much to take how to take this when to take this additional instructions   azelastine 0.1 % nasal spray Commonly known as: ASTELIN Place 2 sprays into both nostrils 2 (two) times daily.   cefdinir 300 MG capsule Commonly known as: OMNICEF Take 1 capsule (300 mg total) by mouth 2 (two) times daily for 5 days.   clobetasol cream 0.05 % Commonly known as: TEMOVATE Apply 1 application. topically 2 (two) times daily.   FreeStyle Libre 3 Sensor Misc 1 Piece by Does  not apply route  every 14 (fourteen) days. Place 1 sensor on the skin every 14 days. Use to check glucose continuously Dx E11.65   insulin detemir 100 UNIT/ML FlexPen Commonly known as: LEVEMIR Inject 8 Units into the skin at bedtime.   insulin lispro 100 UNIT/ML KwikPen Commonly known as: HumaLOG KwikPen Inject 4-7 Units into the skin 3 (three) times daily before meals.   levothyroxine 175 MCG tablet Commonly known as: SYNTHROID TAKE 1 TABLET DAILY What changed: when to take this   levothyroxine 25 MCG tablet Commonly known as: SYNTHROID Take one tablet po daily but on Sunday take 1 tablet What changed:  how much to take how to take this when to take this additional instructions   lipase/protease/amylase 36000 UNITS Cpep capsule Commonly known as: Creon Take 2 capsules (72,000 Units total) by mouth 3 (three) times daily with meals. May also take 1 capsule (36,000 Units total) as needed (with snacks).   oxyCODONE-acetaminophen 5-325 MG tablet Commonly known as: PERCOCET/ROXICET Take 1 tablet by mouth every 6 (six) hours as needed for severe pain.   sildenafil 100 MG tablet Commonly known as: VIAGRA TAKE 1/2 TO 1 TABLET BY MOUTH AS NEEDED FOR ERECTILE DYSFUNCTION What changed: See the new instructions.        Major procedures and Radiology Reports - PLEASE review detailed and final reports for all details, in brief -   DG Chest Port 1 View  Result Date: 12/11/2021 CLINICAL DATA:  Chest pain EXAM: PORTABLE CHEST 1 VIEW COMPARISON:  Chest x-ray dated April 30, 2021 FINDINGS: The heart size and mediastinal contours are within normal limits. Right chest wall port. Both lungs are clear. The visualized skeletal structures are unremarkable. IMPRESSION: No active disease. Electronically Signed   By: Yetta Glassman M.D.   On: 12/11/2021 10:53   US Venous Img Lower Bilateral  Result Date: 11/23/2021 CLINICAL DATA:  Leg swelling and RIGHT lower extremity pain for 1 week in a 66 year old male.  EXAM: BILATERAL LOWER EXTREMITY VENOUS DOPPLER ULTRASOUND TECHNIQUE: Gray-scale sonography with graded compression, as well as color Doppler and duplex ultrasound were performed to evaluate the lower extremity deep venous systems from the level of the common femoral vein and including the common femoral, femoral, profunda femoral, popliteal and calf veins including the posterior tibial, peroneal and gastrocnemius veins when visible. The superficial great saphenous vein was also interrogated. Spectral Doppler was utilized to evaluate flow at rest and with distal augmentation maneuvers in the common femoral, femoral and popliteal veins. COMPARISON:  Prior ultrasound of January 05/08/2022. FINDINGS: RIGHT LOWER EXTREMITY Common Femoral Vein: No evidence of thrombus. Normal compressibility, respiratory phasicity and response to augmentation. Saphenofemoral Junction: No evidence of thrombus. Normal compressibility and flow on color Doppler imaging. Profunda Femoral Vein: No evidence of thrombus. Normal compressibility and flow on color Doppler imaging. Femoral Vein: No evidence of thrombus. Normal compressibility, respiratory phasicity and response to augmentation. Popliteal Vein: Occlusive thrombus in the RIGHT popliteal vein extending into the posterior tibial, peroneal and gastrocs veins. Calf Veins: Thrombus extending from the popliteal vein into the calf veins. Superficial Great Saphenous Vein: No evidence of thrombus. Normal compressibility. Other Findings:  Extensive edema in the RIGHT lower leg. LEFT LOWER EXTREMITY Common Femoral Vein: No evidence of thrombus. Normal compressibility, respiratory phasicity and response to augmentation. Saphenofemoral Junction: No evidence of thrombus. Normal compressibility and flow on color Doppler imaging. Profunda Femoral Vein: No evidence of thrombus. Normal compressibility and flow on color Doppler imaging. Femoral Vein: No  evidence of thrombus. Normal compressibility,  respiratory phasicity and response to augmentation. Popliteal Vein: Nonocclusive thrombus in the popliteal vein though it is nearly occlusive. Thrombus tracks into the anterior tibial vein where it appears occlusive, sparing peroneal veins and posterior tibial vein. Calf Veins: Anterior tibial venous involvement as discussed above. Superficial Great Saphenous Vein: No evidence of thrombus. Normal compressibility. Other Findings:  Edema in the LEFT lower extremity. IMPRESSION: Occlusive deep venous thrombosis beginning at the popliteal level in the RIGHT lower extremity and extending into calf veins. Nearly occlusive thrombus in the LEFT popliteal extending into anterior tibial vein as discussed above. Critical Value/emergent results were called by telephone at the time of interpretation on 11/23/2021 at 4:16 pm to provider Sallee Lange , who verbally acknowledged these results. Electronically Signed   By: Zetta Bills M.D.   On: 11/23/2021 16:16    Micro Results   Recent Results (from the past 240 hour(s))  Culture, blood (Routine x 2)     Status: None (Preliminary result)   Collection Time: 12/11/21  9:49 AM   Specimen: Right Antecubital; Blood  Result Value Ref Range Status   Specimen Description RIGHT ANTECUBITAL  Final   Special Requests   Final    BOTTLES DRAWN AEROBIC AND ANAEROBIC Blood Culture adequate volume   Culture   Final    NO GROWTH 2 DAYS Performed at [facility], [address] [place], [place] [address]    Report Status PENDING  Incomplete  Resp Panel by RT-PCR (Flu A&B, Covid) Anterior Nasal Swab     Status: None   Collection Time: 12/11/21  9:49 AM   Specimen: Anterior Nasal Swab  Result Value Ref Range Status   SARS Coronavirus 2 by RT PCR NEGATIVE NEGATIVE Final    Comment: (NOTE) SARS-CoV-2 target nucleic acids are NOT DETECTED.  The SARS-CoV-2 RNA is generally detectable in upper respiratory specimens during the acute phase of infection. The lowest concentration of  SARS-CoV-2 viral copies this assay can detect is 138 copies/mL. A negative result does not preclude SARS-Cov-2 infection and should not be used as the sole basis for treatment or other patient management decisions. A negative result may occur with  improper specimen collection/handling, submission of specimen other than nasopharyngeal swab, presence of viral mutation(s) within the areas targeted by this assay, and inadequate number of viral copies(<138 copies/mL). A negative result must be combined with clinical observations, patient history, and epidemiological information. The expected result is Negative.  Fact Sheet for Patients:  EntrepreneurPulse.com.au  Fact Sheet for Healthcare Providers:  IncredibleEmployment.be  This test is no t yet approved or cleared by the [place] FDA and  has been authorized for detection and/or diagnosis of SARS-CoV-2 by FDA under an Emergency Use Authorization (EUA). This EUA will remain  in effect (meaning this test can be used) for the duration of the COVID-19 declaration under Section 564(b)(1) of the Act, 21 U.S.C.section 360bbb-3(b)(1), unless the authorization is terminated  or revoked sooner.       Influenza A by PCR NEGATIVE NEGATIVE Final   Influenza B by PCR NEGATIVE NEGATIVE Final    Comment: (NOTE) The Xpert Xpress SARS-CoV-2/FLU/RSV plus assay is intended as an aid in the diagnosis of influenza from Nasopharyngeal swab specimens and should not be used as a sole basis for treatment. Nasal washings and aspirates are unacceptable for Xpert Xpress SARS-CoV-2/FLU/RSV testing.  Fact Sheet for Patients: EntrepreneurPulse.com.au  Fact Sheet for Healthcare Providers: IncredibleEmployment.be  This test is not yet approved  or cleared by the [place] and has been authorized for detection and/or diagnosis of SARS-CoV-2 by FDA under an Emergency Use  Authorization (EUA). This EUA will remain in effect (meaning this test can be used) for the duration of the COVID-19 declaration under Section 564(b)(1) of the Act, 21 U.S.C. section 360bbb-3(b)(1), unless the authorization is terminated or revoked.  Performed at [facility], [address], [place], [place] [address]   Urine Culture     Status: None   Collection Time: 12/11/21  9:49 AM   Specimen: Urine, Catheterized  Result Value Ref Range Status   Specimen Description   Final    URINE, CATHETERIZED Performed at [facility], [address] [place], [place] [address]    Special Requests   Final    NONE Performed at [facility], [address] [place], [place] [address]    Culture   Final    NO GROWTH Performed at [facility], [place] [address], [place], [place] [address]    Report Status 12/12/2021 FINAL  Final  Culture, blood (Routine x 2)     Status: None (Preliminary result)   Collection Time: 12/11/21 10:06 AM   Specimen: BLOOD LEFT ARM  Result Value Ref Range Status   Specimen Description BLOOD LEFT ARM  Final   Special Requests   Final    BOTTLES DRAWN AEROBIC AND ANAEROBIC Blood Culture results may not be optimal due to an excessive volume of blood received in culture bottles   Culture   Final    NO GROWTH 2 DAYS Performed at [facility], [address], [place], [place] [address]    Report Status PENDING  Incomplete    Today   Subjective    Alexander Banks today has no new concerns  No fever  Or chills   No Nausea, Vomiting or Diarrhea --- Wife at bedside, questions answered -Eating and drinking well,          Patient has been seen and examined prior to discharge   Objective   Blood pressure 133/87, pulse 81, temperature 98.2 F (36.8 C), resp. rate 11, height 6' 2"  (1.88 m), weight 76.2 kg, SpO2 98 %.   Intake/Output Summary (Last 24 hours) at 12/13/2021 1238 Last data filed at 12/13/2021 0900 Gross per 24 hour  Intake 1400.23 ml  Output --   Net 1400.23 ml   Exam Gen:- Awake Alert, chronically ill-appearing HEENT:- [place].AT, No sclera icterus Neck-Supple Neck,No JVD,.  Lungs-  CTAB , fair symmetrical air movement CV- S1, S2 normal, regular , right-sided Port-A-Cath in situ Abd-  +ve B.Sounds, Abd Soft, No tenderness,    Extremity/Skin:- No  edema, pedal pulses present  Psych-affect is appropriate, oriented x3 Neuro-no new focal deficits, no tremors   Data Review   CBC w Diff:  Lab Results  Component Value Date   WBC 7.3 12/12/2021   HGB 9.9 (L) 12/12/2021   HGB 13.6 04/30/2021   HCT 30.9 (L) 12/12/2021   HCT 41.0 04/30/2021   PLT 259 12/12/2021   PLT 190 04/30/2021   LYMPHOPCT 21 12/12/2021   MONOPCT 16 12/12/2021   EOSPCT 2 12/12/2021   BASOPCT 1 12/12/2021   CMP:  Lab Results  Component Value Date   NA 140 12/12/2021   NA 140 04/30/2021   K 3.9 12/12/2021   CL 114 (H) 12/12/2021   CO2 24 12/12/2021   BUN 16 12/12/2021   BUN 12 04/30/2021   CREATININE 0.79 12/12/2021  CREATININE 1.10 05/31/2013   PROT 5.9 (L) 12/11/2021   PROT 6.3 07/19/2021   ALBUMIN 2.2 (L) 12/11/2021   ALBUMIN 3.2 (L) 07/19/2021   BILITOT 1.0 12/11/2021   BILITOT 0.4 07/19/2021   ALKPHOS 81 12/11/2021   AST 17 12/11/2021   ALT 10 12/11/2021  .  Total Discharge time is about 33 minutes  Roxan Hockey M.D on 12/13/2021 at 12:38 PM  Go to [email or web address] -  for contact info  [facility] - Office  [phone]

## 2021-12-14 ENCOUNTER — Telehealth: Payer: Self-pay | Admitting: *Deleted

## 2021-12-14 ENCOUNTER — Other Ambulatory Visit: Payer: Self-pay

## 2021-12-14 NOTE — Telephone Encounter (Signed)
Transition Care Management Follow-up Telephone Call Date of discharge and from where: 12/13/21 from [facility] How have you been since you were released from the hospital? No problems Any questions or concerns? No  Items Reviewed: Did the pt receive and understand the discharge instructions provided? Yes  Medications obtained and verified?  No problems or concerns Other? No  Any new allergies since your discharge? No  Dietary orders reviewed? No Do you have support at home? Yes   Home Care and Equipment/Supplies: Were home health services ordered? not applicable If so, what is the name of the agency? N/A Has the agency set up a time to come to the patient's home? not applicable Were any new equipment or medical supplies ordered?  No What is the name of the medical supply agency? N/A Were you able to get the supplies/equipment? not applicable Do you have any questions related to the use of the equipment or supplies? No-N/A  Functional Questionnaire: (I = Independent and D = Dependent) ADLs: I  Bathing/Dressing- I  Meal Prep- I  Eating- I  Maintaining continence- I  Transferring/Ambulation- I  Managing Meds- I  Follow up appointments reviewed:  PCP Hospital f/u appt confirmed? Yes  Scheduled to see Dr Nicki Reaper on 12/19/21 @ 10:20am. [facility] f/u appt confirmed? No   Are transportation arrangements needed? No  If their condition worsens, is the pt aware to call PCP or go to the Emergency Dept.? Yes Was the patient provided with contact information for the PCP's office or ED? Yes Was to pt encouraged to call back with questions or concerns? Yes    Blood culture results from hospital no back at this time- Patient stated he will call back this afternoon to see if they have come in

## 2021-12-16 LAB — CULTURE, BLOOD (ROUTINE X 2)
Culture: NO GROWTH
Culture: NO GROWTH
Special Requests: ADEQUATE

## 2021-12-17 ENCOUNTER — Other Ambulatory Visit: Payer: Self-pay

## 2021-12-17 ENCOUNTER — Encounter (HOSPITAL_COMMUNITY): Payer: Self-pay

## 2021-12-17 ENCOUNTER — Telehealth: Payer: Self-pay

## 2021-12-17 DIAGNOSIS — C259 Malignant neoplasm of pancreas, unspecified: Secondary | ICD-10-CM

## 2021-12-17 NOTE — Telephone Encounter (Signed)
-----   Message from Irving Copas., MD sent at 12/17/2021  1:27 PM EDT ----- SK, His post Whipple anatomy will make attempt at EUS biopsy basically impossible.  If an enteroscope can get to the pancreaticojejunostomy, which sometimes we can with typical push enteroscope versus sometimes needing a device assisted enteroscope (which we cannot offer here) we could try to get biopsies at that site if there is malignancy growing (I saw he had positive margins on his pathology from your notation).  Teondre Jarosz, Please offer my August 10, 12:00 to 1:00 slot for enteroscopy attempt at reaching pancreaticojejunostomy for biopsy with concern for recurrence. Thanks. GM  ----- Message ----- From: Timothy Lasso, RN Sent: 12/17/2021  12:55 PM EDT To: Milus Banister, MD; #   ----- Message ----- From: Derek Jack, MD Sent: 12/17/2021  12:52 PM EDT To: Timothy Lasso, RN; Brien Mates, RN  Dr, Basilia Jumbo thought the area to the right of the SMA and portal vein looks like IRE effect.  But as his CA 19-9 is consistently going up, biopsy would be the next best step.  It would be great if we can get the biopsy of the lesion at the pancreaticoduodenal anastomosis.  Thanks. ----- Message ----- From: Brien Mates, RN Sent: 12/17/2021  11:59 AM EDT To: Derek Jack, MD; Timothy Lasso, RN  Good morning!  The attached patient previously had an EUS with [name] for a diagnosis of pancreatic cancer and has some concerning findings on recent scans. The surgeon that performed his procedure (Dr. Basilia Jumbo) as well as [name] have recommended a repeat EUS and biopsy as soon as possible.  The patient is aware of the above.  Let me know what you need from [place] to help make this happen.  Thanks, Lilia Pro

## 2021-12-17 NOTE — Progress Notes (Signed)
Notification received from [name] that Dr. Basilia Jumbo has reviewed patient's scan and recommends repeat EUS. I have called the patient's wife and made her aware. All questions addressed and answered. I have sent a message to Koren Shiver, RN at [place] for scheduling. Awaiting schedule at this time.

## 2021-12-17 NOTE — Telephone Encounter (Signed)
12/27/21 at [facility] at 12 noon with GM   Anti coag sent to Dr Wolfgang Phoenix  Left message on machine to call back

## 2021-12-18 ENCOUNTER — Other Ambulatory Visit (HOSPITAL_COMMUNITY): Payer: BC Managed Care – PPO

## 2021-12-18 ENCOUNTER — Other Ambulatory Visit: Payer: Self-pay

## 2021-12-18 ENCOUNTER — Ambulatory Visit (HOSPITAL_COMMUNITY): Payer: BC Managed Care – PPO

## 2021-12-18 NOTE — Telephone Encounter (Signed)
The pt has been advised of the procedure appt date and time.  Will await anti coag response from Dr Wolfgang Phoenix.

## 2021-12-19 ENCOUNTER — Ambulatory Visit (INDEPENDENT_AMBULATORY_CARE_PROVIDER_SITE_OTHER): Payer: BC Managed Care – PPO | Admitting: Family Medicine

## 2021-12-19 ENCOUNTER — Encounter: Payer: Self-pay | Admitting: Family Medicine

## 2021-12-19 VITALS — BP 110/70 | Wt 180.2 lb

## 2021-12-19 DIAGNOSIS — I82493 Acute embolism and thrombosis of other specified deep vein of lower extremity, bilateral: Secondary | ICD-10-CM

## 2021-12-19 DIAGNOSIS — Z125 Encounter for screening for malignant neoplasm of prostate: Secondary | ICD-10-CM

## 2021-12-19 DIAGNOSIS — E039 Hypothyroidism, unspecified: Secondary | ICD-10-CM | POA: Diagnosis not present

## 2021-12-19 MED ORDER — APIXABAN 5 MG PO TABS: 5.0000 mg | ORAL_TABLET | Freq: Two times a day (BID) | ORAL | 5 refills | Status: DC

## 2021-12-19 NOTE — Patient Instructions (Addendum)
Stop Eliquis Tuesday night No Eliquis on Weds No Eliquis on Thursday Restart after the procedure either Thursday evening or Friday morning depending on when the specialist recommends after doing the procedure.  If any questions please call me thanks-Hartley Urton

## 2021-12-19 NOTE — Progress Notes (Signed)
   Subjective:    Patient ID: Alexander Banks, male    DOB: Jul 01, 1955, 66 y.o.   MRN: [id number]  HPI Pt arrives for hospital follow up. Pt went to [facility] 12/11/21 due to spiking a fever. Pt wife text PCP and was told to go to ER for blood work. Forestine Na treated patient for Sepsis. Pt requesting refill of Eliquis 5 mg BID I did discuss this case with [name] via messaging.  He recommends stopping the Eliquis on Tuesday evening none on Wednesday not on Thursday his procedure is on Thursday The specialist will determine when to restart his Eliquis more than likely that evening or on Friday  Review of Systems     Objective:   Physical Exam  Lungs clear hearts regular abdomen soft extremities no edema diabetic foot exam normal      Assessment & Plan:  1. Acute deep vein thrombosis (DVT) of other specified vein of both lower extremities ([place]) Continue Eliquis  He has a procedure coming up I did discuss this with his oncologist via messaging Eliquis he will stop on Tuesday evening, no to Eliquis on Wednesday, none on Thursday, he will have his procedure on Thursday then start back on the Eliquis either Thursday evening or Friday morning depending on what the specialist  2. Acquired hypothyroidism Check lab work in a couple months May need adjusting the medicine - PSA - TSH - T4, Free  3. Screening PSA (prostate specific antigen) Check labs - PSA - TSH - T4, Free Recheck here within 6 months sooner if any problems Hospital follow-up no sign of any sepsis

## 2021-12-20 ENCOUNTER — Other Ambulatory Visit (HOSPITAL_COMMUNITY): Payer: Self-pay

## 2021-12-20 ENCOUNTER — Encounter (HOSPITAL_COMMUNITY): Payer: Self-pay | Admitting: Gastroenterology

## 2021-12-20 ENCOUNTER — Telehealth: Payer: Self-pay

## 2021-12-20 NOTE — Telephone Encounter (Signed)
-----   Message from [name], MD sent at 12/20/2021  9:00 AM EDT ----- Verlee Monte I did touch base with [name] the hematologist. With his procedure being on Thursday it is recommended for him to stop the Eliquis on Tuesday evening.  He may take Tuesday morning but stop Tuesday evening.  None on Wednesday.  None on Thursday.  If he tolerates the procedure well your specialist can restart the Eliquis on Thursday evening if they do not feel comfortable with that then restart the Eliquis on Friday morning.  There is a recent office visit note that I did with the patient that addresses this as well from a documentation standpoint  If you need further information please connect with me thank you-Dr. Sallee Lange family medicine ----- Message ----- From: Timothy Lasso, RN Sent: 12/17/2021   1:42 PM EDT To: [name], MD

## 2021-12-20 NOTE — Telephone Encounter (Signed)
The pt has been advised of the Eliquis hold.  He will call back if he has any questions.

## 2021-12-21 ENCOUNTER — Other Ambulatory Visit (HOSPITAL_COMMUNITY): Payer: Self-pay

## 2021-12-24 ENCOUNTER — Other Ambulatory Visit (HOSPITAL_COMMUNITY): Payer: Self-pay

## 2021-12-25 ENCOUNTER — Ambulatory Visit (HOSPITAL_COMMUNITY): Payer: BC Managed Care – PPO

## 2021-12-25 ENCOUNTER — Other Ambulatory Visit (HOSPITAL_COMMUNITY): Payer: BC Managed Care – PPO

## 2021-12-26 NOTE — Anesthesia Preprocedure Evaluation (Signed)
Anesthesia Evaluation  Patient identified by MRN, date of birth, ID band Patient awake    Reviewed: Allergy & Precautions, NPO status , Patient's Chart, lab work & pertinent test results  Airway Mallampati: II  TM Distance: >3 FB Neck ROM: Full    Dental no notable dental hx. (+) Poor Dentition   Pulmonary sleep apnea and Continuous Positive Airway Pressure Ventilation , Current SmokerPatient did not abstain from smoking.,    Pulmonary exam normal breath sounds clear to auscultation       Cardiovascular hypertension, + DVT (on aphixiban)  Normal cardiovascular exam Rhythm:Regular Rate:Normal     Neuro/Psych    GI/Hepatic negative GI ROS,   Endo/Other  diabetesHypothyroidism   Renal/GU      Musculoskeletal  (+) Arthritis , Osteoarthritis,    Abdominal   Peds  Hematology   Anesthesia Other Findings Pancreatic CA  ALL: lisinopril  Reproductive/Obstetrics                            Anesthesia Physical Anesthesia Plan  ASA: 2  Anesthesia Plan: MAC   Post-op Pain Management: Minimal or no pain anticipated   Induction: Intravenous  PONV Risk Score and Plan: 1 and Treatment may vary due to age or medical condition, TIVA and Propofol infusion  Airway Management Planned: Natural Airway, Simple Face Mask and Nasal Cannula  Additional Equipment: None  Intra-op Plan:   Post-operative Plan:   Informed Consent: I have reviewed the patients History and Physical, chart, labs and discussed the procedure including the risks, benefits and alternatives for the proposed anesthesia with the patient or authorized representative who has indicated his/her understanding and acceptance.     Dental advisory given  Plan Discussed with:   Anesthesia Plan Comments: (EUA for Pancreatic CA)       Anesthesia Quick Evaluation

## 2021-12-27 ENCOUNTER — Encounter: Payer: Self-pay | Admitting: Gastroenterology

## 2021-12-27 ENCOUNTER — Encounter (HOSPITAL_COMMUNITY): Payer: Self-pay | Admitting: Gastroenterology

## 2021-12-27 ENCOUNTER — Other Ambulatory Visit: Payer: Self-pay

## 2021-12-27 ENCOUNTER — Ambulatory Visit (HOSPITAL_COMMUNITY): Payer: BC Managed Care – PPO | Admitting: Anesthesiology

## 2021-12-27 ENCOUNTER — Ambulatory Visit (HOSPITAL_COMMUNITY): Payer: BC Managed Care – PPO

## 2021-12-27 ENCOUNTER — Encounter (HOSPITAL_COMMUNITY)
Admission: RE | Disposition: A | Discharge status: Home / Self Care | Payer: Self-pay | Source: Home / Self Care | Attending: Gastroenterology

## 2021-12-27 ENCOUNTER — Ambulatory Visit (HOSPITAL_COMMUNITY)
Admission: RE | Admit: 2021-12-27 | Discharge: 2021-12-27 | Disposition: A | Discharge status: Home / Self Care | Payer: BC Managed Care – PPO | Attending: Gastroenterology | Admitting: Gastroenterology

## 2021-12-27 DIAGNOSIS — C250 Malignant neoplasm of head of pancreas: Secondary | ICD-10-CM | POA: Diagnosis not present

## 2021-12-27 DIAGNOSIS — C259 Malignant neoplasm of pancreas, unspecified: Secondary | ICD-10-CM | POA: Insufficient documentation

## 2021-12-27 DIAGNOSIS — Z7901 Long term (current) use of anticoagulants: Secondary | ICD-10-CM | POA: Insufficient documentation

## 2021-12-27 DIAGNOSIS — D490 Neoplasm of unspecified behavior of digestive system: Secondary | ICD-10-CM

## 2021-12-27 DIAGNOSIS — F17290 Nicotine dependence, other tobacco product, uncomplicated: Secondary | ICD-10-CM | POA: Diagnosis not present

## 2021-12-27 DIAGNOSIS — K2289 Other specified disease of esophagus: Secondary | ICD-10-CM | POA: Diagnosis not present

## 2021-12-27 DIAGNOSIS — Z980 Intestinal bypass and anastomosis status: Secondary | ICD-10-CM | POA: Diagnosis not present

## 2021-12-27 DIAGNOSIS — Z90411 Acquired partial absence of pancreas: Secondary | ICD-10-CM | POA: Insufficient documentation

## 2021-12-27 DIAGNOSIS — E119 Type 2 diabetes mellitus without complications: Secondary | ICD-10-CM | POA: Insufficient documentation

## 2021-12-27 DIAGNOSIS — Z86718 Personal history of other venous thrombosis and embolism: Secondary | ICD-10-CM | POA: Insufficient documentation

## 2021-12-27 DIAGNOSIS — G4730 Sleep apnea, unspecified: Secondary | ICD-10-CM | POA: Diagnosis not present

## 2021-12-27 DIAGNOSIS — K3189 Other diseases of stomach and duodenum: Secondary | ICD-10-CM | POA: Diagnosis not present

## 2021-12-27 DIAGNOSIS — Z09 Encounter for follow-up examination after completed treatment for conditions other than malignant neoplasm: Secondary | ICD-10-CM | POA: Diagnosis not present

## 2021-12-27 HISTORY — PX: ENTEROSCOPY: SHX5533

## 2021-12-27 LAB — GLUCOSE, CAPILLARY
Glucose-Capillary: 87 mg/dL (ref 70–99)
Glucose-Capillary: 101 mg/dL — ABNORMAL HIGH (ref 70–99)

## 2021-12-27 SURGERY — ENTEROSCOPY
Anesthesia: Monitor Anesthesia Care

## 2021-12-27 MED ORDER — PROPOFOL 500 MG/50ML IV EMUL
INTRAVENOUS | Status: DC | PRN
  Administered 2021-12-27: 150 ug/kg/min via INTRAVENOUS
  Administered 2021-12-27: 125 ug/kg/min via INTRAVENOUS

## 2021-12-27 MED ORDER — ONDANSETRON HCL 4 MG/2ML IJ SOLN
INTRAMUSCULAR | Status: DC | PRN
  Administered 2021-12-27: 4 mg via INTRAVENOUS

## 2021-12-27 MED ORDER — LACTATED RINGERS IV SOLN: INTRAVENOUS | Status: DC

## 2021-12-27 MED ORDER — PROPOFOL 10 MG/ML IV BOLUS
INTRAVENOUS | Status: DC | PRN
  Administered 2021-12-27: 40 mg via INTRAVENOUS

## 2021-12-27 MED ORDER — LIDOCAINE 2% (20 MG/ML) 5 ML SYRINGE
INTRAMUSCULAR | Status: DC | PRN
  Administered 2021-12-27: 100 mg via INTRAVENOUS

## 2021-12-27 NOTE — Anesthesia Postprocedure Evaluation (Signed)
Anesthesia Post Note  Patient: Alexander Banks  Procedure(s) Performed: ENTEROSCOPY     Patient location during evaluation: Endoscopy Anesthesia Type: MAC Level of consciousness: awake and alert Pain management: pain level controlled Vital Signs Assessment: post-procedure vital signs reviewed and stable Respiratory status: spontaneous breathing, nonlabored ventilation, respiratory function stable and patient connected to nasal cannula oxygen Cardiovascular status: blood pressure returned to baseline and stable Postop Assessment: no apparent nausea or vomiting Anesthetic complications: no   No notable events documented.  Last Vitals:  Vitals:   12/27/21 1330 12/27/21 1345  BP: 115/77 134/77  Pulse: 84 75  Resp: (!) 22 18  Temp: (!) 36.3 C (!) 36.4 C  SpO2: 91% 95%    Last Pain:  Vitals:   12/27/21 1330  PainSc: 0-No pain                 Barnet Glasgow

## 2021-12-27 NOTE — H&P (Signed)
GASTROENTEROLOGY PROCEDURE H&P NOTE   Primary Care Physician: [name], MD  HPI: Alexander Banks is a 66 y.o. male who presents for enteroscopy to try to evaluate the pancreaticojejunostomy in patient's status post Whipple for pancreatic adenocarcinoma with positive margins and rising CA 19-9.  Past Medical History:  Diagnosis Date   Anemia, iron deficiency    Diabetes mellitus without complication ([place])    on meds   Hearing loss    Bil hearing aids   Hyperlipidemia    Hypertension    Port-A-Cath in place 12/26/2020   Prediabetes    Sleep apnea    Past Surgical History:  Procedure Laterality Date   BILIARY STENT PLACEMENT N/A 11/09/2020   Procedure: BILIARY STENT PLACEMENT - 42 F X 5 CM STENT;  Surgeon: Rogene Houston, MD;  Location: [facility];  Service: Endoscopy;  Laterality: N/A;   BILIARY STENT PLACEMENT N/A 12/27/2020   Procedure: BILIARY STENT PLACEMENT;  Surgeon: Rogene Houston, MD;  Location: [facility];  Service: Endoscopy;  Laterality: N/A;   COLONOSCOPY     ERCP N/A 11/09/2020   Procedure: ENDOSCOPIC RETROGRADE CHOLANGIOPANCREATOGRAPHY (ERCP);  Surgeon: Rogene Houston, MD;  Location: [facility];  Service: Endoscopy;  Laterality: N/A;   ERCP N/A 12/27/2020   Procedure: ENDOSCOPIC RETROGRADE CHOLANGIOPANCREATOGRAPHY (ERCP);  Surgeon: Rogene Houston, MD;  Location: [facility];  Service: Endoscopy;  Laterality: N/A;   ESOPHAGOGASTRODUODENOSCOPY (EGD) WITH PROPOFOL N/A 11/30/2020   Procedure: ESOPHAGOGASTRODUODENOSCOPY (EGD) WITH PROPOFOL;  Surgeon: Milus Banister, MD;  Location: [facility];  Service: Endoscopy;  Laterality: N/A;   EUS N/A 11/30/2020   Procedure: UPPER ENDOSCOPIC ULTRASOUND (EUS) RADIAL;  Surgeon: Milus Banister, MD;  Location: [facility];  Service: Endoscopy;  Laterality: N/A;   FINE NEEDLE ASPIRATION N/A 11/30/2020   Procedure: FINE NEEDLE ASPIRATION (FNA) LINEAR;  Surgeon: Milus Banister, MD;  Location: [facility];  Service: Endoscopy;   Laterality: N/A;   GIVENS CAPSULE STUDY     IR IMAGING GUIDED PORT INSERTION  12/18/2020   KNEE SURGERY     4 surgeries on left, 2 on right knee   SHOULDER SURGERY     right shoulder   SPHINCTEROTOMY N/A 11/09/2020   Procedure: SHORT BILIARY SPHINCTEROTOMY;  Surgeon: Rogene Houston, MD;  Location: [facility];  Service: Endoscopy;  Laterality: N/A;   WHIPPLE PROCEDURE     Current Facility-Administered Medications  Medication Dose Route Frequency Provider Last Rate Last Admin   lactated ringers infusion   Intravenous Continuous Mansouraty, Telford Nab., MD 10 mL/hr at 12/27/21 1104 New Bag at 12/27/21 1104    Current Facility-Administered Medications:    lactated ringers infusion, , Intravenous, Continuous, Mansouraty, Telford Nab., MD, Last Rate: 10 mL/hr at 12/27/21 1104, New Bag at 12/27/21 1104 Allergies  Allergen Reactions   Lisinopril Cough   Family History  Problem Relation Age of Onset   Heart attack Father    Heart attack Paternal Grandfather    Leukemia Sister    Heart attack Brother    Social History   Socioeconomic History   Marital status: Married    Spouse name: Not on file   Number of children: Not on file   Years of education: Not on file   Highest education level: Not on file  Occupational History   Not on file  Tobacco Use   Smoking status: Every Day    Types: Cigars   Smokeless tobacco: Never   Tobacco comments:    Smoke  3-4 cigars a day  Vaping Use   Vaping Use: Never used  Substance and Sexual Activity   Alcohol use: Yes    Comment: occasional   Drug use: Never   Sexual activity: Not on file  Other Topics Concern   Not on file  Social History Narrative   Not on file   Social Determinants of Health   Financial Resource Strain: Low Risk  (12/20/2020)   Overall Financial Resource Strain (CARDIA)    Difficulty of Paying Living Expenses: Not hard at all  Food Insecurity: No Food Insecurity (12/20/2020)   Hunger Vital Sign    Worried About Running  Out of Food in the Last Year: Never true    [place] in the Last Year: Never true  Transportation Needs: No Transportation Needs (12/20/2020)   PRAPARE - Hydrologist (Medical): No    Lack of Transportation (Non-Medical): No  Physical Activity: Sufficiently Active (12/20/2020)   Exercise Vital Sign    Days of Exercise per Week: 5 days    Minutes of Exercise per Session: 30 min  Recent Concern: Physical Activity - Inactive (11/17/2020)   Exercise Vital Sign    Days of Exercise per Week: 0 days    Minutes of Exercise per Session: 0 min  Stress: Not on file  Social Connections: Not on file  Intimate Partner Violence: Not At Risk (11/17/2020)   Humiliation, Afraid, Rape, and Kick questionnaire    Fear of Current or Ex-Partner: No    Emotionally Abused: No    Physically Abused: No    Sexually Abused: No    Physical Exam: Today's Vitals   12/27/21 1042  BP: 122/81  Pulse: 83  Resp: 16  Temp: (!) 97.3 F (36.3 C)  SpO2: 96%  Weight: 77.6 kg  Height: '6\' 2"'$  (1.88 m)  PainSc: 0-No pain   Body mass index is 21.96 kg/m. GEN: NAD EYE: Sclerae anicteric ENT: MMM CV: Non-tachycardic GI: Soft, NT/ND NEURO:  Alert & Oriented x 3  Lab Results: No results for input(s): "WBC", "HGB", "HCT", "PLT" in the last 72 hours. BMET No results for input(s): "NA", "K", "CL", "CO2", "GLUCOSE", "BUN", "CREATININE", "CALCIUM" in the last 72 hours. LFT No results for input(s): "PROT", "ALBUMIN", "AST", "ALT", "ALKPHOS", "BILITOT", "BILIDIR", "IBILI" in the last 72 hours. PT/INR No results for input(s): "LABPROT", "INR" in the last 72 hours.   Impression / Plan: This is a 66 y.o.male who presents for enteroscopy to try to evaluate the pancreaticojejunostomy in patient's status post Whipple for pancreatic adenocarcinoma with positive margins and rising CA 19-9.  The risks and benefits of endoscopic evaluation/treatment were discussed with the patient and/or family;  these include but are not limited to the risk of perforation, infection, bleeding, missed lesions, lack of diagnosis, severe illness requiring hospitalization, as well as anesthesia and sedation related illnesses.  The patient's history has been reviewed, patient examined, no change in status, and deemed stable for procedure.  The patient and/or family is agreeable to proceed.    Justice Britain, MD [facility] Office # [phone]

## 2021-12-27 NOTE — Transfer of Care (Signed)
Immediate Anesthesia Transfer of Care Note  Patient: Alexander Banks  Procedure(s) Performed: ENTEROSCOPY  Patient Location: PACU  Anesthesia Type:MAC  Level of Consciousness: drowsy and patient cooperative  Airway & Oxygen Therapy: Patient Spontanous Breathing and Patient connected to nasal cannula oxygen  Post-op Assessment: Report given to RN and Post -op Vital signs reviewed and stable  Post vital signs: Reviewed and stable  Last Vitals:  Vitals Value Taken Time  BP 115/77 12/27/21 1330  Temp    Pulse 83 12/27/21 1332  Resp 19 12/27/21 1332  SpO2 91 % 12/27/21 1332  Vitals shown include unvalidated device data.  Last Pain:  Vitals:   12/27/21 1330  PainSc: 0-No pain         Complications: No notable events documented.

## 2021-12-27 NOTE — Op Note (Signed)
[facility] Patient Name: Alexander Banks Procedure Date : 12/27/2021 MRN: [id number] Attending MD: Justice Britain , MD Date of Birth: Jun 27, 1955 CSN: [id number] Age: 66 Admit Type: Inpatient Procedure:                Small bowel enteroscopy Indications:              Follow-up of tumor of the GI tract Providers:                Justice Britain, MD, Kary Kos RN, RN, Frazier Richards, Technician Referring MD:             Derek Jack, MD Medicines:                Monitored Anesthesia Care Complications:            No immediate complications. Estimated Blood Loss:     Estimated blood loss was minimal. Procedure:                Pre-Anesthesia Assessment:                           - Prior to the procedure, a History and Physical                            was performed, and patient medications and                            allergies were reviewed. The patient's tolerance of                            previous anesthesia was also reviewed. The risks                            and benefits of the procedure and the sedation                            options and risks were discussed with the patient.                            All questions were answered, and informed consent                            was obtained. Prior Anticoagulants: The patient has                            taken Eliquis (apixaban), last dose was 2 days                            prior to procedure. ASA Grade Assessment: III - A                            patient with severe systemic disease. After  reviewing the risks and benefits, the patient was                            deemed in satisfactory condition to undergo the                            procedure.                           After obtaining informed consent, the endoscope was                            passed under direct vision. Throughout the                             procedure, the patient's blood pressure, pulse, and                            oxygen saturations were monitored continuously. The                            [id number]-HQ190TL ([id number] Olympus peds colonoscope was                            introduced through the mouth and advanced to the                            efferent jejunal loop. The small bowel enteroscopy                            was accomplished without difficulty. The patient                            tolerated the procedure. Scope In: Scope Out: Findings:      No gross lesions were noted in the entire esophagus.      The Z-line was irregular and was found 43 cm from the incisors.      No gross lesions were noted in the entire examined stomach.      A J-shaped deformity was found of the gastric body.      There was evidence of a patent duodenoenterostomy in the duodenal bulb.       This was characterized by healthy appearing mucosa.      Normal mucosa was found in the apparent jejunum.      Normal mucosa was found in the efferent jejunum.      Unfortuantely, could not reach the pancreaticojejunostomy (we ran out of       scope) in both a supine and left-lateral positioning standpoint. Impression:               - No gross lesions in esophagus.                           - Z-line irregular, 43 cm from the incisors.                           -  No gross lesions in the stomach.                           - J-shaped deformity in the gastric body.                           - Patent duodenoenterostomy, characterized by                            healthy appearing mucosa was found.                           - Normal mucosa was found in the efferent and                            afferent jejunum. Recommendation:           - The patient will be observed post-procedure,                            until all discharge criteria are met.                           - Discharge patient to home.                           - Patient has a contact  number available for                            emergencies. The signs and symptoms of potential                            delayed complications were discussed with the                            patient. Return to normal activities tomorrow.                            Written discharge instructions were provided to the                            patient.                           - Resume previous diet.                           - Will reach out to Interventional Radiology to see                            if this will accessible for percutaneous access                            biopsy vs will a referral to [facility] allow for  Device-assisted enteroscopy to try and reach the                            area in question.                           - Continue present medications.                           - The findings and recommendations were discussed                            with the patient.                           - The findings and recommendations were discussed                            with the patient's family. Procedure Code(s):        --- Professional ---                           [phone], Small intestinal endoscopy, enteroscopy                            beyond second portion of duodenum, not including                            ileum; diagnostic, including collection of                            specimen(s) by brushing or washing, when performed                            (separate procedure) Diagnosis Code(s):        --- Professional ---                           K22.8, Other specified diseases of esophagus                           K31.89, Other diseases of stomach and duodenum                           Z98.0, Intestinal bypass and anastomosis status                           D49.0, Neoplasm of unspecified behavior of                            digestive system CPT copyright 2019 American Medical Association. All rights reserved. The codes  documented in this report are preliminary and upon coder review may  be revised to meet current compliance requirements. Justice Britain, MD 12/27/2021 1:43:36 PM Number of Addenda: 0

## 2021-12-29 ENCOUNTER — Encounter (HOSPITAL_COMMUNITY): Payer: Self-pay | Admitting: Gastroenterology

## 2021-12-31 DIAGNOSIS — C250 Malignant neoplasm of head of pancreas: Secondary | ICD-10-CM | POA: Diagnosis not present

## 2021-12-31 NOTE — Progress Notes (Signed)
I personally reached out to the office of Dr. Basilia Jumbo after the patient's enteroscopy attempt. I was able to speak with his nurse as Dr. I was not available/out of town. I told them that our records would be available on care everywhere. I forwarded this information to the patient's oncologist as well. Again as outlined in my enteroscopy note, the only way that this will be able to be reached within endoscope would be to pursue a double-balloon enteroscopy which cannot be performed in [place]. Due to his postsurgical anatomy, trying to evaluate this area via EUS will not be able to be pursued. Discussion with interventional radiology suggested that the lesion surrounding the SMV (appears new) and may be able to be a potential target.  However this would be higher risk due to needing to go through multiple viscera.  However reaching the pancreaticojejunostomy via interventional radiology would not be possible.  Hopefully the patient's surgical oncologist and oncologist can better decide how they want to approach this and monitor with surveillance imaging versus referral to likely [facility] advanced endoscopy to consider DBE if necessary.  I will be out of town the rest of this week so if other issues arise I am happy to try to be available when I return if needed.   Justice Britain, MD [facility] Office # [phone]

## 2022-01-04 ENCOUNTER — Telehealth: Payer: Self-pay

## 2022-01-04 NOTE — Telephone Encounter (Signed)
Please advise. Thank you

## 2022-01-04 NOTE — Telephone Encounter (Signed)
Caller name:Dat Red[place] Pulling   On DPR? :Yes  Call back number:[phone]  Provider they see: DR Nicki Reaper   Reason for [place] marked urgent that needs to be sign placed in Dr Nicki Reaper folder up front

## 2022-01-06 NOTE — Telephone Encounter (Signed)
Patient is having a procedure on September 6 the specialist doing the procedure states patient needs to stop the Eliquis 2 days prior he is requesting that Eliquis be stopped on 9 4.  This is similar to previous situation.  Please let patient know that it would be fine to stop his Eliquis 2 days before this procedure then after the procedure may resume either that evening or the following morning depending on what the specialist says.  This was filled out on the form which needs to be forwarded back to the specialist at [facility].  If there are any questions let me know

## 2022-01-07 NOTE — Telephone Encounter (Signed)
Pt contacted and verbalized understanding. Form faxed to GI office

## 2022-01-14 DIAGNOSIS — M170 Bilateral primary osteoarthritis of knee: Secondary | ICD-10-CM | POA: Diagnosis not present

## 2022-01-14 DIAGNOSIS — M79671 Pain in right foot: Secondary | ICD-10-CM | POA: Insufficient documentation

## 2022-01-23 DIAGNOSIS — Z90410 Acquired total absence of pancreas: Secondary | ICD-10-CM | POA: Diagnosis not present

## 2022-01-23 DIAGNOSIS — R933 Abnormal findings on diagnostic imaging of other parts of digestive tract: Secondary | ICD-10-CM | POA: Diagnosis not present

## 2022-01-23 DIAGNOSIS — E039 Hypothyroidism, unspecified: Secondary | ICD-10-CM | POA: Diagnosis not present

## 2022-01-23 DIAGNOSIS — Z794 Long term (current) use of insulin: Secondary | ICD-10-CM | POA: Diagnosis not present

## 2022-01-23 DIAGNOSIS — Z8507 Personal history of malignant neoplasm of pancreas: Secondary | ICD-10-CM | POA: Diagnosis not present

## 2022-01-23 DIAGNOSIS — I10 Essential (primary) hypertension: Secondary | ICD-10-CM | POA: Diagnosis not present

## 2022-01-23 DIAGNOSIS — K9081 Whipple's disease: Secondary | ICD-10-CM | POA: Diagnosis not present

## 2022-01-23 DIAGNOSIS — F17200 Nicotine dependence, unspecified, uncomplicated: Secondary | ICD-10-CM | POA: Diagnosis not present

## 2022-01-23 DIAGNOSIS — E119 Type 2 diabetes mellitus without complications: Secondary | ICD-10-CM | POA: Diagnosis not present

## 2022-01-23 DIAGNOSIS — C251 Malignant neoplasm of body of pancreas: Secondary | ICD-10-CM | POA: Diagnosis not present

## 2022-01-23 DIAGNOSIS — F17290 Nicotine dependence, other tobacco product, uncomplicated: Secondary | ICD-10-CM | POA: Diagnosis not present

## 2022-01-23 DIAGNOSIS — K8689 Other specified diseases of pancreas: Secondary | ICD-10-CM | POA: Diagnosis not present

## 2022-01-24 DIAGNOSIS — M79672 Pain in left foot: Secondary | ICD-10-CM | POA: Diagnosis not present

## 2022-01-24 DIAGNOSIS — M7662 Achilles tendinitis, left leg: Secondary | ICD-10-CM | POA: Diagnosis not present

## 2022-01-24 DIAGNOSIS — M722 Plantar fascial fibromatosis: Secondary | ICD-10-CM | POA: Diagnosis not present

## 2022-01-28 ENCOUNTER — Inpatient Hospital Stay: Payer: BC Managed Care – PPO | Attending: Hematology

## 2022-01-28 ENCOUNTER — Other Ambulatory Visit: Payer: Self-pay | Admitting: *Deleted

## 2022-01-28 ENCOUNTER — Inpatient Hospital Stay (HOSPITAL_BASED_OUTPATIENT_CLINIC_OR_DEPARTMENT_OTHER): Payer: BC Managed Care – PPO | Admitting: Physician Assistant

## 2022-01-28 ENCOUNTER — Other Ambulatory Visit: Payer: Self-pay

## 2022-01-28 VITALS — BP 117/78 | HR 91 | Temp 98.4°F | Resp 18 | Ht 75.0 in | Wt 179.0 lb

## 2022-01-28 DIAGNOSIS — R109 Unspecified abdominal pain: Secondary | ICD-10-CM | POA: Insufficient documentation

## 2022-01-28 DIAGNOSIS — R7401 Elevation of levels of liver transaminase levels: Secondary | ICD-10-CM | POA: Insufficient documentation

## 2022-01-28 DIAGNOSIS — R1013 Epigastric pain: Secondary | ICD-10-CM | POA: Insufficient documentation

## 2022-01-28 DIAGNOSIS — C259 Malignant neoplasm of pancreas, unspecified: Secondary | ICD-10-CM

## 2022-01-28 DIAGNOSIS — R17 Unspecified jaundice: Secondary | ICD-10-CM | POA: Insufficient documentation

## 2022-01-28 DIAGNOSIS — K5900 Constipation, unspecified: Secondary | ICD-10-CM | POA: Insufficient documentation

## 2022-01-28 DIAGNOSIS — Z79899 Other long term (current) drug therapy: Secondary | ICD-10-CM | POA: Insufficient documentation

## 2022-01-28 DIAGNOSIS — R140 Abdominal distension (gaseous): Secondary | ICD-10-CM | POA: Diagnosis not present

## 2022-01-28 LAB — CBC WITH DIFFERENTIAL/PLATELET
Abs Immature Granulocytes: 0.02 10*3/uL (ref 0.00–0.07)
Basophils Absolute: 0 10*3/uL (ref 0.0–0.1)
Basophils Relative: 1 %
Eosinophils Absolute: 0 10*3/uL (ref 0.0–0.5)
Eosinophils Relative: 1 %
HCT: 36.7 % — ABNORMAL LOW (ref 39.0–52.0)
Hemoglobin: 12.2 g/dL — ABNORMAL LOW (ref 13.0–17.0)
Immature Granulocytes: 0 %
Lymphocytes Relative: 16 %
Lymphs Abs: 0.9 10*3/uL (ref 0.7–4.0)
MCH: 30.7 pg (ref 26.0–34.0)
MCHC: 33.2 g/dL (ref 30.0–36.0)
MCV: 92.2 fL (ref 80.0–100.0)
Monocytes Absolute: 0.6 10*3/uL (ref 0.1–1.0)
Monocytes Relative: 10 %
Neutro Abs: 4 10*3/uL (ref 1.7–7.7)
Neutrophils Relative %: 72 %
Platelets: 363 10*3/uL (ref 150–400)
RBC: 3.98 MIL/uL — ABNORMAL LOW (ref 4.22–5.81)
RDW: 17 % — ABNORMAL HIGH (ref 11.5–15.5)
WBC: 5.4 10*3/uL (ref 4.0–10.5)
nRBC: 0 % (ref 0.0–0.2)

## 2022-01-28 LAB — COMPREHENSIVE METABOLIC PANEL
ALT: 49 U/L — ABNORMAL HIGH (ref 0–44)
AST: 76 U/L — ABNORMAL HIGH (ref 15–41)
Albumin: 2.2 g/dL — ABNORMAL LOW (ref 3.5–5.0)
Alkaline Phosphatase: 343 U/L — ABNORMAL HIGH (ref 38–126)
Anion gap: 7 (ref 5–15)
BUN: 9 mg/dL (ref 8–23)
CO2: 24 mmol/L (ref 22–32)
Calcium: 8 mg/dL — ABNORMAL LOW (ref 8.9–10.3)
Chloride: 103 mmol/L (ref 98–111)
Creatinine, Ser: 0.72 mg/dL (ref 0.61–1.24)
GFR, Estimated: >60 mL/min (ref >60)
Glucose, Bld: 110 mg/dL — ABNORMAL HIGH (ref 70–99)
Potassium: 3.8 mmol/L (ref 3.5–5.1)
Sodium: 134 mmol/L — ABNORMAL LOW (ref 135–145)
Total Bilirubin: 7.5 mg/dL — ABNORMAL HIGH (ref 0.3–1.2)
Total Protein: 6.1 g/dL — ABNORMAL LOW (ref 6.5–8.1)

## 2022-01-28 LAB — MAGNESIUM: Magnesium: 2 mg/dL (ref 1.7–2.4)

## 2022-01-28 NOTE — Progress Notes (Unsigned)
[place] [facility] [address], [place] [address] Phone: [phone] Fax: [place] PROGRESS NOTE   Alexander Banks [id number] 1955/12/06 66 y.o.   Subjective:  Chief Complaint: Jaundice, abdominal pain  Alexander Banks is followed by [name] for pancreatic adenocarcinoma.  Last seen by [name] on 11/27/2021.  CT scan from 11/08/2021 showed note new 1.1 cm lesion in pancreaticoduodenal anastomosis.  He had endoscopic fine-needle biopsy of pancreatic mass on 01/23/2022, which showed recurrent pancreatic adenocarcinoma.  He presents today due to yellow discoloration of his skin, dark urine, and mild epigastric abdominal soreness and distention for the past 2 to 3 days.  He denies any changes in his bowel movements size, consistency, or color.  No fever or chills.  Denies any nausea or vomiting.  Review of Systems:  Review of Systems  Constitutional:  Positive for activity change and appetite change. Negative for chills, diaphoresis, fatigue, fever and unexpected weight change.  HENT:  Negative for mouth sores, nosebleeds, sore throat and trouble swallowing.   Respiratory:  Negative for cough and shortness of breath.   Cardiovascular:  Negative for chest pain, palpitations and leg swelling.  Gastrointestinal:  Positive for abdominal pain and constipation. Negative for blood in stool, diarrhea, nausea and vomiting.  Genitourinary:  Negative for dysuria and hematuria.       Dark urine  Skin:  Positive for color change.  Neurological:  Negative for dizziness, light-headedness, numbness and headaches.  Psychiatric/Behavioral:  Negative for dysphoric mood and sleep disturbance. The patient is not nervous/anxious.      Past Medical History, Surgical history, Social history, and Family history were reviewed as documented elsewhere in chart, and were updated as appropriate.   Assessment & Plan:    1.  Jaundice and  abdominal pain in the setting of recurrent pancreatic cancer - Recently diagnosed with recurrent pancreatic cancer - Onset of jaundice and mild epigastric abdominal pain x3 days - Labs today (01/28/2022) show markedly elevated total bilirubin 7.5, alk phos 343, AST 76, ALT 49 (LFTs and bilirubin were normal when checked on 12/12/2021). - PLAN: Acute hyperbilirubinemia and transaminitis concerning for biliary obstruction, metastatic disease, or other acute intra-abdominal process. - We will obtain CT chest/abdomen/pelvis ASAP to gauge status of recurrent pancreatic cancer. - Discussed with RN Adonis Huguenin, who will assist with obtaining NGS testing of tissue sampling from recent biopsy at [facility]. - We will reach out to patient's GI provider (Dr. Rush Landmark) in case patient requires repeat ERCP and stenting in the near future. - Patient will be scheduled to see [name] in 2 to 3 weeks, pending results of NGS testing to develop neck steps in treatment plan - Patient given strict return precautions if any worsening abdominal pain or fever, and to proceed to emergency department if outside of office hours.   Objective:   Physical Exam:  BP 117/78 (BP Location: Left Arm, Patient Position: Sitting)   Pulse 91   Temp 98.4 F (36.9 C) (Oral)   Resp 18   Ht 6' 3"  (1.905 m)   Wt 179 lb 0.2 oz (81.2 kg)   SpO2 100%   BMI 22.38 kg/m  ECOG: 1  Physical Exam Vitals reviewed.  Constitutional:      Appearance: Normal appearance.  Eyes:     General: Scleral icterus present.  Cardiovascular:     Rate and Rhythm: Normal rate and regular rhythm.     Pulses: Normal pulses.  Heart sounds: Normal heart sounds.  Pulmonary:     Effort: Pulmonary effort is normal.     Breath sounds: Normal breath sounds.  Abdominal:     Comments: Mild epigastric tenderness.  No appreciable distension.  Skin:    Coloration: Skin is jaundiced.  Neurological:     General: No focal deficit present.      Mental Status: He is alert and oriented to person, place, and time.  Psychiatric:        Mood and Affect: Mood normal.        Behavior: Behavior normal.     Lab Review:     Component Value Date/Time   NA 140 12/12/2021 0557   NA 140 04/30/2021 1655   K 3.9 12/12/2021 0557   CL 114 (H) 12/12/2021 0557   CO2 24 12/12/2021 0557   GLUCOSE 77 12/12/2021 0557   BUN 16 12/12/2021 0557   BUN 12 04/30/2021 1655   CREATININE 0.79 12/12/2021 0557   CREATININE 1.10 05/31/2013 1020   CALCIUM 7.4 (L) 12/12/2021 0557   PROT 5.9 (L) 12/11/2021 1005   PROT 6.3 07/19/2021 0934   ALBUMIN 2.2 (L) 12/11/2021 1005   ALBUMIN 3.2 (L) 07/19/2021 0934   AST 17 12/11/2021 1005   ALT 10 12/11/2021 1005   ALKPHOS 81 12/11/2021 1005   BILITOT 1.0 12/11/2021 1005   BILITOT 0.4 07/19/2021 0934   GFRNONAA >60 12/12/2021 0557   GFRAA 87 06/02/2020 0812       Component Value Date/Time   WBC 7.3 12/12/2021 0557   RBC 2.97 (L) 12/12/2021 0557   HGB 9.9 (L) 12/12/2021 0557   HGB 13.6 04/30/2021 1655   HCT 30.9 (L) 12/12/2021 0557   HCT 41.0 04/30/2021 1655   PLT 259 12/12/2021 0557   PLT 190 04/30/2021 1655   MCV 104.0 (H) 12/12/2021 0557   MCV 99 (H) 04/30/2021 1655   MCH 33.3 12/12/2021 0557   MCHC 32.0 12/12/2021 0557   RDW 17.0 (H) 12/12/2021 0557   RDW 13.9 04/30/2021 1655   LYMPHSABS 1.6 12/12/2021 0557   LYMPHSABS 0.5 (L) 04/30/2021 1655   MONOABS 1.2 (H) 12/12/2021 0557   EOSABS 0.2 12/12/2021 0557   EOSABS 0.0 04/30/2021 1655   BASOSABS 0.1 12/12/2021 0557   BASOSABS 0.0 04/30/2021 1655   -------------------------------  Imaging from last 24 hours (if applicable):  Radiology interpretation: No results found.    Wrap-Up:    All questions were answered. The patient knows to call the clinic with any problems, questions or concerns.  Medical decision making: Moderate  Time spent on visit: I spent 25 minutes counseling the patient face to face. The total time spent in the  appointment was 40 minutes and more than 50% was on counseling.   Harriett Rush, PA-C  01/29/22 6:21 PM

## 2022-01-28 NOTE — Patient Instructions (Signed)
[facility] **   You were seen today by Tarri Abernethy PA-C for your symptom management visit.    PANCREATIC CANCER Your increased bilirubin and liver enzymes is most likely related to recurrent pancreatic cancer. We will check CT of your abdomen and pelvis to assess for any new or progressive disease. We will schedule you for follow-up visit with [name] in 3 weeks, which should give enough time for the genetic testing of your tumor to be completed. Seek IMMEDIATE MEDICAL ATTENTION at the emergency department if you have any new or worsening abdominal pain or any fever.   ** Thank you for trusting me with your healthcare!  I strive to provide all of my patients with quality care at each visit.  If you receive a survey for this visit, I would be so grateful to you for taking the time to provide feedback.  Thank you in advance!  ~ Sahej Hauswirth                   Dr. Derek Jack   &   Tarri Abernethy, PA-C   - - - - - - - - - - - - - - - - - -    Thank you for choosing [facility] at [facility] to provide your oncology and hematology care.  To afford each patient quality time with our provider, please arrive at least 15 minutes before your scheduled appointment time.   If you have a lab appointment with the [facility] please come in thru the Main Entrance and check in at the main information desk.  You need to re-schedule your appointment should you arrive 10 or more minutes late.  We strive to give you quality time with our providers, and arriving late affects you and other patients whose appointments are after yours.  Also, if you no show three or more times for appointments you may be dismissed from the clinic at the providers discretion.     Again, thank you for choosing [facility].  Our hope is that these requests will decrease the amount of time that you  wait before being seen by our physicians.       _____________________________________________________________  Should you have questions after your visit to [facility], please contact our office at [phone] and follow the prompts.  Our office hours are 8:00 a.m. and 4:30 p.m. Monday - Friday.  Please note that voicemails left after 4:00 p.m. may not be returned until the following business day.  We are closed weekends and major holidays.  You do have access to a nurse 24-7, just call the main number to the clinic [phone] and do not press any options, hold on the line and a nurse will answer the phone.    For prescription refill requests, have your pharmacy contact our office and allow 72 hours.

## 2022-01-29 ENCOUNTER — Encounter: Payer: Self-pay | Admitting: Hematology

## 2022-01-29 ENCOUNTER — Other Ambulatory Visit: Payer: Self-pay

## 2022-01-29 ENCOUNTER — Ambulatory Visit (HOSPITAL_COMMUNITY)
Admission: RE | Admit: 2022-01-29 | Discharge: 2022-01-29 | Disposition: A | Discharge status: Home / Self Care | Payer: BC Managed Care – PPO | Source: Ambulatory Visit | Attending: Physician Assistant | Admitting: Physician Assistant

## 2022-01-29 ENCOUNTER — Other Ambulatory Visit: Payer: Self-pay | Admitting: Physician Assistant

## 2022-01-29 DIAGNOSIS — N281 Cyst of kidney, acquired: Secondary | ICD-10-CM | POA: Diagnosis not present

## 2022-01-29 DIAGNOSIS — C259 Malignant neoplasm of pancreas, unspecified: Secondary | ICD-10-CM

## 2022-01-29 DIAGNOSIS — J439 Emphysema, unspecified: Secondary | ICD-10-CM | POA: Diagnosis not present

## 2022-01-29 DIAGNOSIS — K8689 Other specified diseases of pancreas: Secondary | ICD-10-CM | POA: Diagnosis not present

## 2022-01-29 DIAGNOSIS — R079 Chest pain, unspecified: Secondary | ICD-10-CM | POA: Diagnosis not present

## 2022-01-29 MED ORDER — IOHEXOL 350 MG/ML SOLN
100.0000 mL | Freq: Once | INTRAVENOUS | Status: AC | PRN
  Administered 2022-01-29: 100 mL via INTRAVENOUS

## 2022-01-30 ENCOUNTER — Other Ambulatory Visit: Payer: Self-pay

## 2022-01-31 ENCOUNTER — Encounter: Payer: Self-pay | Admitting: Physician Assistant

## 2022-01-31 ENCOUNTER — Telehealth: Payer: Self-pay | Admitting: Physician Assistant

## 2022-01-31 NOTE — Telephone Encounter (Signed)
I called patient Alexander Banks and his wife [name] this morning to discuss results of his CT scan this week, which showed progressive enlargement of pancreatic tumor and adjacent lymph node causing high-grade biliary obstruction.  I have informed them that we have reached out to the patient's specialists at [facility] in [place], as these are their preferred specialists for any further procedural interventions.  We are awaiting reply from the [facility] specialist, patient has been given strict return precautions if he has progressive abdominal pain or develops fever, which could be a sign of cholangitis.

## 2022-02-01 ENCOUNTER — Other Ambulatory Visit: Payer: Self-pay

## 2022-02-01 ENCOUNTER — Inpatient Hospital Stay (HOSPITAL_COMMUNITY)
Admission: EM | Admit: 2022-02-01 | Discharge: 2022-02-06 | DRG: 444 | Disposition: A | Discharge status: Home / Self Care | Payer: BC Managed Care – PPO | Attending: Internal Medicine | Admitting: Internal Medicine

## 2022-02-01 ENCOUNTER — Emergency Department (HOSPITAL_COMMUNITY): Payer: BC Managed Care – PPO

## 2022-02-01 ENCOUNTER — Encounter (HOSPITAL_COMMUNITY): Payer: Self-pay

## 2022-02-01 ENCOUNTER — Telehealth: Payer: Self-pay | Admitting: Physician Assistant

## 2022-02-01 DIAGNOSIS — K838 Other specified diseases of biliary tract: Secondary | ICD-10-CM | POA: Diagnosis present

## 2022-02-01 DIAGNOSIS — C259 Malignant neoplasm of pancreas, unspecified: Secondary | ICD-10-CM | POA: Diagnosis present

## 2022-02-01 DIAGNOSIS — K831 Obstruction of bile duct: Principal | ICD-10-CM | POA: Diagnosis present

## 2022-02-01 DIAGNOSIS — N1831 Chronic kidney disease, stage 3a: Secondary | ICD-10-CM | POA: Diagnosis not present

## 2022-02-01 DIAGNOSIS — Z86718 Personal history of other venous thrombosis and embolism: Secondary | ICD-10-CM

## 2022-02-01 DIAGNOSIS — Z7901 Long term (current) use of anticoagulants: Secondary | ICD-10-CM | POA: Diagnosis not present

## 2022-02-01 DIAGNOSIS — E039 Hypothyroidism, unspecified: Secondary | ICD-10-CM | POA: Diagnosis not present

## 2022-02-01 DIAGNOSIS — E871 Hypo-osmolality and hyponatremia: Secondary | ICD-10-CM | POA: Diagnosis not present

## 2022-02-01 DIAGNOSIS — E1122 Type 2 diabetes mellitus with diabetic chronic kidney disease: Secondary | ICD-10-CM | POA: Diagnosis present

## 2022-02-01 DIAGNOSIS — I10 Essential (primary) hypertension: Secondary | ICD-10-CM | POA: Diagnosis present

## 2022-02-01 DIAGNOSIS — D509 Iron deficiency anemia, unspecified: Secondary | ICD-10-CM | POA: Diagnosis present

## 2022-02-01 DIAGNOSIS — E782 Mixed hyperlipidemia: Secondary | ICD-10-CM | POA: Diagnosis present

## 2022-02-01 DIAGNOSIS — E119 Type 2 diabetes mellitus without complications: Secondary | ICD-10-CM

## 2022-02-01 DIAGNOSIS — G4733 Obstructive sleep apnea (adult) (pediatric): Secondary | ICD-10-CM | POA: Diagnosis not present

## 2022-02-01 DIAGNOSIS — R1011 Right upper quadrant pain: Principal | ICD-10-CM

## 2022-02-01 DIAGNOSIS — Z79899 Other long term (current) drug therapy: Secondary | ICD-10-CM | POA: Diagnosis not present

## 2022-02-01 DIAGNOSIS — K8050 Calculus of bile duct without cholangitis or cholecystitis without obstruction: Secondary | ICD-10-CM | POA: Diagnosis not present

## 2022-02-01 DIAGNOSIS — I129 Hypertensive chronic kidney disease with stage 1 through stage 4 chronic kidney disease, or unspecified chronic kidney disease: Secondary | ICD-10-CM | POA: Diagnosis not present

## 2022-02-01 DIAGNOSIS — F17290 Nicotine dependence, other tobacco product, uncomplicated: Secondary | ICD-10-CM | POA: Diagnosis present

## 2022-02-01 DIAGNOSIS — K55059 Acute (reversible) ischemia of intestine, part and extent unspecified: Secondary | ICD-10-CM | POA: Diagnosis not present

## 2022-02-01 DIAGNOSIS — H9193 Unspecified hearing loss, bilateral: Secondary | ICD-10-CM | POA: Diagnosis present

## 2022-02-01 DIAGNOSIS — Z806 Family history of leukemia: Secondary | ICD-10-CM

## 2022-02-01 DIAGNOSIS — Z974 Presence of external hearing-aid: Secondary | ICD-10-CM

## 2022-02-01 DIAGNOSIS — R17 Unspecified jaundice: Secondary | ICD-10-CM

## 2022-02-01 DIAGNOSIS — R112 Nausea with vomiting, unspecified: Secondary | ICD-10-CM | POA: Diagnosis not present

## 2022-02-01 DIAGNOSIS — Z794 Long term (current) use of insulin: Secondary | ICD-10-CM | POA: Diagnosis not present

## 2022-02-01 DIAGNOSIS — Z90411 Acquired partial absence of pancreas: Secondary | ICD-10-CM | POA: Diagnosis not present

## 2022-02-01 DIAGNOSIS — K760 Fatty (change of) liver, not elsewhere classified: Secondary | ICD-10-CM | POA: Diagnosis present

## 2022-02-01 DIAGNOSIS — Z79890 Hormone replacement therapy (postmenopausal): Secondary | ICD-10-CM | POA: Diagnosis not present

## 2022-02-01 DIAGNOSIS — Z95828 Presence of other vascular implants and grafts: Secondary | ICD-10-CM | POA: Diagnosis not present

## 2022-02-01 DIAGNOSIS — Z8249 Family history of ischemic heart disease and other diseases of the circulatory system: Secondary | ICD-10-CM

## 2022-02-01 DIAGNOSIS — R109 Unspecified abdominal pain: Secondary | ICD-10-CM

## 2022-02-01 DIAGNOSIS — R7401 Elevation of levels of liver transaminase levels: Secondary | ICD-10-CM | POA: Diagnosis not present

## 2022-02-01 DIAGNOSIS — Z9221 Personal history of antineoplastic chemotherapy: Secondary | ICD-10-CM

## 2022-02-01 DIAGNOSIS — R932 Abnormal findings on diagnostic imaging of liver and biliary tract: Secondary | ICD-10-CM | POA: Diagnosis not present

## 2022-02-01 LAB — URINALYSIS, ROUTINE W REFLEX MICROSCOPIC
Glucose, UA: NEGATIVE mg/dL
Ketones, ur: NEGATIVE mg/dL
Leukocytes,Ua: NEGATIVE
Nitrite: NEGATIVE
Protein, ur: 30 mg/dL — AB
Specific Gravity, Urine: 1.029 (ref 1.005–1.030)
pH: 5 (ref 5.0–8.0)

## 2022-02-01 LAB — CBC WITH DIFFERENTIAL/PLATELET
Abs Immature Granulocytes: 0.05 10*3/uL (ref 0.00–0.07)
Basophils Absolute: 0 10*3/uL (ref 0.0–0.1)
Basophils Relative: 0 %
Eosinophils Absolute: 0 10*3/uL (ref 0.0–0.5)
Eosinophils Relative: 0 %
HCT: 30.5 % — ABNORMAL LOW (ref 39.0–52.0)
Hemoglobin: 10.4 g/dL — ABNORMAL LOW (ref 13.0–17.0)
Immature Granulocytes: 1 %
Lymphocytes Relative: 13 %
Lymphs Abs: 1 10*3/uL (ref 0.7–4.0)
MCH: 31.2 pg (ref 26.0–34.0)
MCHC: 34.1 g/dL (ref 30.0–36.0)
MCV: 91.6 fL (ref 80.0–100.0)
Monocytes Absolute: 0.8 10*3/uL (ref 0.1–1.0)
Monocytes Relative: 10 %
Neutro Abs: 5.9 10*3/uL (ref 1.7–7.7)
Neutrophils Relative %: 76 %
Platelets: 354 10*3/uL (ref 150–400)
RBC: 3.33 MIL/uL — ABNORMAL LOW (ref 4.22–5.81)
RDW: 19.9 % — ABNORMAL HIGH (ref 11.5–15.5)
WBC: 7.8 10*3/uL (ref 4.0–10.5)
nRBC: 0 % (ref 0.0–0.2)

## 2022-02-01 LAB — COMPREHENSIVE METABOLIC PANEL
ALT: 62 U/L — ABNORMAL HIGH (ref 0–44)
AST: 93 U/L — ABNORMAL HIGH (ref 15–41)
Albumin: 2 g/dL — ABNORMAL LOW (ref 3.5–5.0)
Alkaline Phosphatase: 372 U/L — ABNORMAL HIGH (ref 38–126)
Anion gap: 6 (ref 5–15)
BUN: 19 mg/dL (ref 8–23)
CO2: 23 mmol/L (ref 22–32)
Calcium: 7.7 mg/dL — ABNORMAL LOW (ref 8.9–10.3)
Chloride: 102 mmol/L (ref 98–111)
Creatinine, Ser: 0.59 mg/dL — ABNORMAL LOW (ref 0.61–1.24)
GFR, Estimated: >60 mL/min (ref >60)
Glucose, Bld: 144 mg/dL — ABNORMAL HIGH (ref 70–99)
Potassium: 3.7 mmol/L (ref 3.5–5.1)
Sodium: 131 mmol/L — ABNORMAL LOW (ref 135–145)
Total Bilirubin: 13.1 mg/dL — ABNORMAL HIGH (ref 0.3–1.2)
Total Protein: 5.9 g/dL — ABNORMAL LOW (ref 6.5–8.1)

## 2022-02-01 LAB — CBG MONITORING, ED: Glucose-Capillary: 151 mg/dL — ABNORMAL HIGH (ref 70–99)

## 2022-02-01 LAB — MAGNESIUM: Magnesium: 1.9 mg/dL (ref 1.7–2.4)

## 2022-02-01 LAB — LACTIC ACID, PLASMA: Lactic Acid, Venous: 1.1 mmol/L (ref 0.5–1.9)

## 2022-02-01 LAB — LIPASE, BLOOD: Lipase: 16 U/L (ref 11–51)

## 2022-02-01 MED ORDER — INSULIN DETEMIR 100 UNIT/ML ~~LOC~~ SOLN
8.0000 [IU] | Freq: Once | SUBCUTANEOUS | Status: AC
  Administered 2022-02-01: 8 [IU] via SUBCUTANEOUS
  Filled 2022-02-01: qty 0.08

## 2022-02-01 MED ORDER — HYDROMORPHONE HCL 1 MG/ML IJ SOLN
1.0000 mg | Freq: Once | INTRAMUSCULAR | Status: AC
  Administered 2022-02-01: 1 mg via INTRAVENOUS
  Filled 2022-02-01: qty 1

## 2022-02-01 MED ORDER — SODIUM CHLORIDE 0.9 % IV BOLUS
1000.0000 mL | Freq: Once | INTRAVENOUS | Status: AC
  Administered 2022-02-01: 1000 mL via INTRAVENOUS

## 2022-02-01 MED ORDER — PIPERACILLIN-TAZOBACTAM 3.375 G IVPB 30 MIN
3.3750 g | Freq: Once | INTRAVENOUS | Status: AC
  Administered 2022-02-01: 3.375 g via INTRAVENOUS
  Filled 2022-02-01: qty 50

## 2022-02-01 NOTE — ED Triage Notes (Signed)
Ambulatory to ED with c/o back pain throughout the day. Sent from Dr's office d/t known bile duct blockage with elevated bilirubin levels. Hx of pancreatic cancer.

## 2022-02-01 NOTE — Telephone Encounter (Signed)
I discussed patient's case with Dr. Jessee Avers physician assistant Marcie Bal), who was able to get in touch with [facility] (Dr. Janith Lima) - they are recommending sending patient to interventional radiology for percutaneous transhepatic intervention in order to provide more immediate relief of biliary obstruction.  After PTC procedure, patient can be referred back to Dr. Janith Lima at [facility] in [place] to see if he would be amenable for repeat ERCP and stenting.  I called patient Alexander Banks and his wife Alexander Banks this afternoon, to discuss the above. They informed me that patient now has temperature 99.6 F. Due to concern for developing cholangitis, I have recommended that they proceed to emergency department at [facility], since that facility would have IR capability for [facility] intervention.  I discussed with Dr. Grayland Ormond (supervising physician), who agrees. I have also called report to Dr. Chryl Heck (on-call oncologist at [facility]).

## 2022-02-01 NOTE — Progress Notes (Signed)
IR eval and management order placed per verbal order from [employer], PA-C.

## 2022-02-01 NOTE — ED Provider Triage Note (Signed)
Emergency Medicine Provider Triage Evaluation Note  Alexander Banks , a 66 y.o. male  was evaluated in triage.  Pt complains of abdominal pain ongoing for about 4 to 5 days.  Somewhat worse today now that it involves the back.  Overall patient is well-appearing without acute distress.  Jaundice of the conjunctive a noted.  Recent labs showed bilirubin of about 7.  Patient does have history of pancreatic cancer.  Review of Systems  Positive: As above Negative: As above  Physical Exam  BP 101/66   Pulse 93   Temp 99.4 F (37.4 C)   Resp 20   Ht '6\' 3"'$  (1.905 m)   Wt 81.2 kg   SpO2 99%   BMI 22.38 kg/m  Gen:   Awake, no distress   Resp:  Normal effort  MSK:   Moves extremities without difficulty  Other:  Right-sided and epigastric abdominal tenderness present  Medical Decision Making  Medically screening exam initiated at 7:49 PM.  Appropriate orders placed.  [name] was informed that the remainder of the evaluation will be completed by another provider, this initial triage assessment does not replace that evaluation, and the importance of remaining in the ED until their evaluation is complete.     Evlyn Courier, PA-C 02/01/22 1950

## 2022-02-01 NOTE — ED Provider Notes (Signed)
[facility] DEPT Provider Note   CSN: [id number] Arrival date & time: 02/01/22  1808     History {Add pertinent medical, surgical, social history, OB history to HPI:1} Chief Complaint  Patient presents with   Back Pain    Alexander Banks is a 66 y.o. male, history of pancreatic cancer, who presents to the ED secondary to right upper quadrant pain, low-grade fevers, anorexia, and bilateral flank/back pain for the last week since he had a biopsy done of his pancreas in [place]. [name] is his oncologist. He has also become jaundiced since the procedure. He endorses having difficulty with his bowels this week as well. Did have a Alexander Banks BM today with some relief of his abdominal pain--back pain has been persistent. Has taken percocets w/o little relief. No blood in stools.    Back Pain Associated symptoms: abdominal pain       Home Medications Prior to Admission medications   Medication Sig Start Date End Date Taking? Authorizing Provider  acetaminophen (TYLENOL) 325 MG tablet Take 650 mg by mouth every 6 (six) hours as needed for fever.    [provider]  apixaban (ELIQUIS) 5 MG TABS tablet Take 1 tablet (5 mg total) by mouth 2 (two) times daily. 12/19/21   [name], MD  azelastine (ASTELIN) 0.1 % nasal spray Place 2 sprays into both nostrils 2 (two) times daily. 11/23/21   [name], MD  clobetasol cream (TEMOVATE) 3.22 % Apply 1 application. topically 2 (two) times daily. 09/17/21   Derek Jack, MD  Continuous Blood Gluc Sensor (FREESTYLE LIBRE 3 SENSOR) MISC 1 Piece by Does not apply route every 14 (fourteen) days. Place 1 sensor on the skin every 14 days. Use to check glucose continuously Dx E11.65 10/22/21   Cassandria Anger, MD  insulin detemir (LEVEMIR) 100 UNIT/ML FlexPen Inject 8 Units into the skin at bedtime. 07/12/21   Cassandria Anger, MD  insulin lispro (HUMALOG KWIKPEN) 100 UNIT/ML KwikPen Inject 4-7  Units into the skin 3 (three) times daily before meals. 07/12/21   Cassandria Anger, MD  levothyroxine (SYNTHROID) 175 MCG tablet TAKE 1 TABLET DAILY Patient taking differently: Take 175 mcg by mouth daily before breakfast. 10/17/21   [name], MD  levothyroxine (SYNTHROID) 25 MCG tablet Take one tablet po daily but on Sunday take 1 tablet Patient taking differently: Take 25-50 mcg by mouth daily. Take two tablet po daily but one on Sunday take 1 tablet 10/23/21   [name], MD  lipase/protease/amylase (CREON) 36000 UNITS CPEP capsule Take 2 capsules (72,000 Units total) by mouth 3 (three) times daily with meals. May also take 1 capsule (36,000 Units total) as needed (with snacks). 05/31/21   [name], MD  oxyCODONE-acetaminophen (PERCOCET/ROXICET) 5-325 MG tablet Take 1 tablet by mouth every 6 (six) hours as needed for severe pain. 05/02/21   [name], MD  sildenafil (VIAGRA) 100 MG tablet TAKE 1/2 TO 1 TABLET BY MOUTH AS NEEDED FOR ERECTILE DYSFUNCTION Patient taking differently: Take 50-100 mg by mouth as needed for erectile dysfunction. 12/03/21   [name], MD      Allergies    Lisinopril    Review of Systems   Review of Systems  Constitutional:  Positive for appetite change.  Gastrointestinal:  Positive for abdominal pain and constipation.  Musculoskeletal:  Positive for back pain.    Physical Exam Updated Vital Signs BP 101/66   Pulse 93  Temp 99.4 F (37.4 C)   Resp 20   Ht '6\' 3"'$  (1.905 m)   Wt 81.2 kg   SpO2 99%   BMI 22.38 kg/m  Physical Exam Vitals and nursing note reviewed.  Constitutional:      Appearance: He is ill-appearing.  HENT:     Head: Normocephalic and atraumatic.     Right Ear: Tympanic membrane normal.     Left Ear: Tympanic membrane normal.     Nose: Nose normal.     Mouth/Throat:     Mouth: Mucous membranes are moist.  Eyes:     General: Scleral icterus present.     Extraocular Movements: Extraocular movements  intact.     Conjunctiva/sclera: Conjunctivae normal.     Pupils: Pupils are equal, round, and reactive to light.  Cardiovascular:     Rate and Rhythm: Normal rate and regular rhythm.  Pulmonary:     Effort: Pulmonary effort is normal.     Breath sounds: Normal breath sounds.  Abdominal:     General: Abdomen is flat. Bowel sounds are normal.     Palpations: Abdomen is soft.     Tenderness: There is abdominal tenderness in the right upper quadrant.     Comments: +TTP of bilateral flanks, R>L  Musculoskeletal:        General: Normal range of motion.     Cervical back: Normal range of motion and neck supple.  Skin:    General: Skin is warm and dry.     Capillary Refill: Capillary refill takes less than 2 seconds.     Coloration: Skin is jaundiced.  Neurological:     General: No focal deficit present.     Mental Status: He is alert.  Psychiatric:        Mood and Affect: Mood normal.        Thought Content: Thought content normal.     ED Results / Procedures / Treatments   Labs (all labs ordered are listed, but only abnormal results are displayed) Labs Reviewed  CBC WITH DIFFERENTIAL/PLATELET  COMPREHENSIVE METABOLIC PANEL  LIPASE, BLOOD  MAGNESIUM    EKG None  Radiology US Abdomen Limited RUQ (LIVER/GB)  Result Date: 02/01/2022 CLINICAL DATA:  Right upper quadrant pain EXAM: ULTRASOUND ABDOMEN LIMITED RIGHT UPPER QUADRANT COMPARISON:  CT 01/29/2022 FINDINGS: Gallbladder: Surgically absent Common bile duct: Diameter: Dilated common bile duct measuring up to 12 mm. There is hypoechoic material or tissue within the bile duct. This is avascular. Liver: No focal hepatic abnormality. Marked intra hepatic biliary dilatation. The liver is echogenic. Portal vein is patent on color Doppler imaging with normal direction of blood flow towards the liver. Other: None. IMPRESSION: 1. Marked intra and extrahepatic biliary dilatation with hypoechoic debris/material versus hypovascular tissue  in the common bile duct. 2. Echogenic liver parenchyma consistent with hepatic steatosis and or hepatocellular disease. Electronically Signed   By: Donavan Foil M.D.   On: 02/01/2022 20:47    Procedures Procedures  {Document cardiac monitor, telemetry assessment procedure when appropriate:1}  Medications Ordered in ED Medications - No data to display  ED Course/ Medical Decision Making/ A&P                           Medical Decision Making Amount and/or Complexity of Data Reviewed Labs: ordered.  Risk Prescription drug management.   This patient presents to the ED for concern of abdominal pain, jaundice, and fever, this involves an extensive number  of treatment options, and is a complaint that carries with it a high risk of complications and morbidity.    Co morbidities that complicate the patient evaluation  Pancreatic cancer   Additional history obtained:  Additional history obtained from wife at bedside External records from outside source obtained and reviewed including phone notes from hematology and procedural notes from [facility].  Lab Tests:  I Ordered, and personally interpreted labs.  The pertinent results include:  ***   Imaging Studies ordered:  I ordered imaging studies including abdominal ultrasound  I independently visualized and interpreted imaging which showed intra end extrahepatic biliary dilation w/debris vs tissue in the common bile duct I agree with the radiologist interpretation   Cardiac Monitoring: / EKG:  The patient was maintained on a cardiac monitor.  I personally viewed and interpreted the cardiac monitored which showed an underlying rhythm of: ***   Consultations Obtained:  I requested consultation with the oncology,  and discussed lab and imaging findings as well as pertinent plan - they recommend: ***   Problem List / ED Course / Critical interventions / Medication management  *** I ordered medication including ***  for  ***  Reevaluation of the patient after these medicines showed that the patient {resolved/improved/worsened:23923::"improved"} I have reviewed the patients home medicines and have made adjustments as needed   Social Determinants of Health:  ***   Test / Admission - Considered:  ***  Final Clinical Impression(s) / ED Diagnoses Final diagnoses:  None    Rx / DC Orders ED Discharge Orders     None

## 2022-02-02 ENCOUNTER — Encounter (HOSPITAL_COMMUNITY): Payer: Self-pay | Admitting: Family Medicine

## 2022-02-02 DIAGNOSIS — Z974 Presence of external hearing-aid: Secondary | ICD-10-CM | POA: Diagnosis not present

## 2022-02-02 DIAGNOSIS — Z86718 Personal history of other venous thrombosis and embolism: Secondary | ICD-10-CM | POA: Diagnosis not present

## 2022-02-02 DIAGNOSIS — K55059 Acute (reversible) ischemia of intestine, part and extent unspecified: Secondary | ICD-10-CM | POA: Diagnosis present

## 2022-02-02 DIAGNOSIS — E039 Hypothyroidism, unspecified: Secondary | ICD-10-CM | POA: Diagnosis present

## 2022-02-02 DIAGNOSIS — Z794 Long term (current) use of insulin: Secondary | ICD-10-CM | POA: Diagnosis not present

## 2022-02-02 DIAGNOSIS — K760 Fatty (change of) liver, not elsewhere classified: Secondary | ICD-10-CM | POA: Diagnosis present

## 2022-02-02 DIAGNOSIS — K8050 Calculus of bile duct without cholangitis or cholecystitis without obstruction: Secondary | ICD-10-CM | POA: Diagnosis present

## 2022-02-02 DIAGNOSIS — R112 Nausea with vomiting, unspecified: Secondary | ICD-10-CM | POA: Diagnosis present

## 2022-02-02 DIAGNOSIS — E871 Hypo-osmolality and hyponatremia: Secondary | ICD-10-CM | POA: Diagnosis present

## 2022-02-02 DIAGNOSIS — N1831 Chronic kidney disease, stage 3a: Secondary | ICD-10-CM | POA: Diagnosis present

## 2022-02-02 DIAGNOSIS — D509 Iron deficiency anemia, unspecified: Secondary | ICD-10-CM | POA: Diagnosis present

## 2022-02-02 DIAGNOSIS — Z7901 Long term (current) use of anticoagulants: Secondary | ICD-10-CM | POA: Diagnosis not present

## 2022-02-02 DIAGNOSIS — Z79899 Other long term (current) drug therapy: Secondary | ICD-10-CM | POA: Diagnosis not present

## 2022-02-02 DIAGNOSIS — C259 Malignant neoplasm of pancreas, unspecified: Secondary | ICD-10-CM | POA: Diagnosis present

## 2022-02-02 DIAGNOSIS — Z95828 Presence of other vascular implants and grafts: Secondary | ICD-10-CM | POA: Diagnosis not present

## 2022-02-02 DIAGNOSIS — E1122 Type 2 diabetes mellitus with diabetic chronic kidney disease: Secondary | ICD-10-CM | POA: Diagnosis present

## 2022-02-02 DIAGNOSIS — Z806 Family history of leukemia: Secondary | ICD-10-CM | POA: Diagnosis not present

## 2022-02-02 DIAGNOSIS — K831 Obstruction of bile duct: Secondary | ICD-10-CM | POA: Diagnosis present

## 2022-02-02 DIAGNOSIS — Z79890 Hormone replacement therapy (postmenopausal): Secondary | ICD-10-CM | POA: Diagnosis not present

## 2022-02-02 DIAGNOSIS — E782 Mixed hyperlipidemia: Secondary | ICD-10-CM | POA: Diagnosis present

## 2022-02-02 DIAGNOSIS — Z90411 Acquired partial absence of pancreas: Secondary | ICD-10-CM | POA: Diagnosis not present

## 2022-02-02 DIAGNOSIS — I129 Hypertensive chronic kidney disease with stage 1 through stage 4 chronic kidney disease, or unspecified chronic kidney disease: Secondary | ICD-10-CM | POA: Diagnosis present

## 2022-02-02 DIAGNOSIS — Z8249 Family history of ischemic heart disease and other diseases of the circulatory system: Secondary | ICD-10-CM | POA: Diagnosis not present

## 2022-02-02 DIAGNOSIS — F17290 Nicotine dependence, other tobacco product, uncomplicated: Secondary | ICD-10-CM | POA: Diagnosis present

## 2022-02-02 DIAGNOSIS — G4733 Obstructive sleep apnea (adult) (pediatric): Secondary | ICD-10-CM | POA: Diagnosis present

## 2022-02-02 LAB — CBC
HCT: 29.3 % — ABNORMAL LOW (ref 39.0–52.0)
Hemoglobin: 10.3 g/dL — ABNORMAL LOW (ref 13.0–17.0)
MCH: 31.9 pg (ref 26.0–34.0)
MCHC: 35.2 g/dL (ref 30.0–36.0)
MCV: 90.7 fL (ref 80.0–100.0)
Platelets: 369 10*3/uL (ref 150–400)
RBC: 3.23 MIL/uL — ABNORMAL LOW (ref 4.22–5.81)
RDW: 20 % — ABNORMAL HIGH (ref 11.5–15.5)
WBC: 9.2 10*3/uL (ref 4.0–10.5)
nRBC: 0 % (ref 0.0–0.2)

## 2022-02-02 LAB — CBG MONITORING, ED
Glucose-Capillary: 85 mg/dL (ref 70–99)
Glucose-Capillary: 87 mg/dL (ref 70–99)

## 2022-02-02 LAB — APTT: aPTT: 31 seconds (ref 24–36)

## 2022-02-02 LAB — GLUCOSE, CAPILLARY
Glucose-Capillary: 126 mg/dL — ABNORMAL HIGH (ref 70–99)
Glucose-Capillary: 123 mg/dL — ABNORMAL HIGH (ref 70–99)

## 2022-02-02 LAB — HEPARIN LEVEL (UNFRACTIONATED): Heparin Unfractionated: <0.1 IU/mL — ABNORMAL LOW (ref 0.30–0.70)

## 2022-02-02 MED ORDER — OXYCODONE HCL 5 MG PO TABS
5.0000 mg | ORAL_TABLET | Freq: Four times a day (QID) | ORAL | Status: DC | PRN
  Administered 2022-02-02 – 2022-02-03 (×4): 5 mg via ORAL
  Filled 2022-02-02 (×4): qty 1

## 2022-02-02 MED ORDER — PIPERACILLIN-TAZOBACTAM 3.375 G IVPB
3.3750 g | Freq: Three times a day (TID) | INTRAVENOUS | Status: DC
  Administered 2022-02-02 – 2022-02-06 (×12): 3.375 g via INTRAVENOUS
  Filled 2022-02-02 (×12): qty 50

## 2022-02-02 MED ORDER — DOCUSATE SODIUM 100 MG PO CAPS
100.0000 mg | ORAL_CAPSULE | Freq: Two times a day (BID) | ORAL | Status: DC
  Administered 2022-02-02 – 2022-02-06 (×7): 100 mg via ORAL
  Filled 2022-02-02 (×8): qty 1

## 2022-02-02 MED ORDER — SODIUM CHLORIDE 0.9 % IV SOLN: INTRAVENOUS | Status: DC

## 2022-02-02 MED ORDER — LEVOTHYROXINE SODIUM 50 MCG PO TABS
175.0000 ug | ORAL_TABLET | Freq: Every day | ORAL | Status: DC
  Administered 2022-02-02 – 2022-02-06 (×4): 175 ug via ORAL
  Filled 2022-02-02 (×4): qty 1

## 2022-02-02 MED ORDER — ACETAMINOPHEN 325 MG PO TABS
650.0000 mg | ORAL_TABLET | Freq: Four times a day (QID) | ORAL | Status: DC | PRN
  Administered 2022-02-03 – 2022-02-04 (×2): 650 mg via ORAL
  Filled 2022-02-02 (×2): qty 2

## 2022-02-02 MED ORDER — INSULIN LISPRO (1 UNIT DIAL) 100 UNIT/ML (KWIKPEN): 4.0000 [IU] | PEN_INJECTOR | Freq: Three times a day (TID) | SUBCUTANEOUS | Status: DC

## 2022-02-02 MED ORDER — SENNA 8.6 MG PO TABS
1.0000 | ORAL_TABLET | Freq: Two times a day (BID) | ORAL | Status: DC
  Administered 2022-02-02 – 2022-02-06 (×7): 8.6 mg via ORAL
  Filled 2022-02-02 (×8): qty 1

## 2022-02-02 MED ORDER — ONDANSETRON HCL 4 MG PO TABS
4.0000 mg | ORAL_TABLET | Freq: Four times a day (QID) | ORAL | Status: DC | PRN
  Filled 2022-02-02: qty 1

## 2022-02-02 MED ORDER — APIXABAN 2.5 MG PO TABS: 5.0000 mg | ORAL_TABLET | Freq: Two times a day (BID) | ORAL | Status: DC

## 2022-02-02 MED ORDER — INSULIN DETEMIR 100 UNIT/ML ~~LOC~~ SOLN
8.0000 [IU] | Freq: Every day | SUBCUTANEOUS | Status: DC
  Administered 2022-02-02 – 2022-02-05 (×3): 8 [IU] via SUBCUTANEOUS
  Filled 2022-02-02 (×5): qty 0.08

## 2022-02-02 MED ORDER — CHLORHEXIDINE GLUCONATE CLOTH 2 % EX PADS
6.0000 | MEDICATED_PAD | Freq: Every day | CUTANEOUS | Status: DC
  Administered 2022-02-03 – 2022-02-06 (×4): 6 via TOPICAL

## 2022-02-02 MED ORDER — INSULIN DETEMIR 100 UNIT/ML FLEXPEN: 8.0000 [IU] | PEN_INJECTOR | Freq: Every day | SUBCUTANEOUS | Status: DC

## 2022-02-02 MED ORDER — ACETAMINOPHEN 650 MG RE SUPP: 650.0000 mg | Freq: Four times a day (QID) | RECTAL | Status: DC | PRN

## 2022-02-02 MED ORDER — INSULIN ASPART 100 UNIT/ML IJ SOLN
4.0000 [IU] | Freq: Three times a day (TID) | INTRAMUSCULAR | Status: DC
  Administered 2022-02-05 – 2022-02-06 (×4): 4 [IU] via SUBCUTANEOUS
  Filled 2022-02-02: qty 0.04

## 2022-02-02 MED ORDER — SODIUM CHLORIDE 0.9% FLUSH
10.0000 mL | INTRAVENOUS | Status: DC | PRN
  Administered 2022-02-06: 10 mL

## 2022-02-02 MED ORDER — HEPARIN (PORCINE) 25000 UT/250ML-% IV SOLN
1600.0000 [IU]/h | INTRAVENOUS | Status: DC
  Administered 2022-02-02: 1300 [IU]/h via INTRAVENOUS
  Administered 2022-02-03: 1600 [IU]/h via INTRAVENOUS
  Filled 2022-02-02 (×2): qty 250

## 2022-02-02 MED ORDER — HEPARIN BOLUS VIA INFUSION
2500.0000 [IU] | Freq: Once | INTRAVENOUS | Status: AC
  Administered 2022-02-02: 2500 [IU] via INTRAVENOUS
  Filled 2022-02-02: qty 2500

## 2022-02-02 MED ORDER — INSULIN ASPART 100 UNIT/ML IJ SOLN
0.0000 [IU] | Freq: Three times a day (TID) | INTRAMUSCULAR | Status: DC
  Administered 2022-02-05 (×2): 1 [IU] via SUBCUTANEOUS
  Filled 2022-02-02: qty 0.06

## 2022-02-02 MED ORDER — SODIUM CHLORIDE 0.9 % IV SOLN: INTRAVENOUS | Status: DC | PRN

## 2022-02-02 MED ORDER — ONDANSETRON HCL 4 MG/2ML IJ SOLN: 4.0000 mg | Freq: Four times a day (QID) | INTRAMUSCULAR | Status: DC | PRN

## 2022-02-02 NOTE — H&P (Signed)
History and Physical    Alexander Banks:[id number] DOB: September 09, 1955 DOA: 02/01/2022  PCP: [name], MD   Patient coming from: Home  I have personally briefly reviewed patient's old medical records in [place]  Chief Complaint: Right upper quadrant pain,  nausea,  vomiting and jaundice.  HPI: Alexander Banks is a 66 y.o. male with PMH significant for pancreatic cancer, following with [name], essential hypertension, hyperlipidemia, hypothyroidism, type 2 diabetes, CKD stage IIIa, obstructive sleep apnea on CPAP presented in the ED with complaints of right upper quadrant abdominal pain associated with subjective fever, nausea and vomiting.  Patient reports he had pancreatic biopsy for pancreatic cancer in [facility] in [place] last week and since then he has been having subjective fevers, right upper quadrant abdominal pain associated with intermittent nausea and vomiting.  Patient also reported yellow discoloration of body.  He describes pain as sharp radiating towards the back on both sides, denies any sick contacts, recent travel.  His oncologist asked suggested him to come to the ED for further evaluation.  ED Course: He was tachycardic and hypertensive other vitals were stable. Temp 99.4, HR 102, RR 20, BP 143/78, SPO2 99% on room air. Labs include sodium 131, potassium 3.7, chloride 102, bicarb 23, glucose 144, BUN 19, creatinine 0.59, calcium 7.7, anion gap 6, magnesium 1.9, alkaline phosphatase 372, albumin 2.0, lipase 16, AST 93, ALT 67, total protein 5.9, total bilirubin 13.1, lactic acid 1.1, WBC 7.8, hemoglobin 10.4, hematocrit 30.5, platelet 354. UA hemoglobin moderate, nitrates negative, leukocytes negative, bacteria rare. Blood cultures and urine cultures were sent.  CT abdomen and pelvis: Enlarging hypoenhancing masses at the pancreatico-jejunal anastomosis and portocaval space consistent with progressive local tumor recurrence. There is resulting  high-grade biliary obstruction at the choledocho jejunostomy. In addition, there is progressive pancreatic ductal dilatation with a submucosal fluid collection in the posterior wall of the stomach which may reflect a small pseudocyst.  Review of Systems: Review of Systems  Constitutional:  Positive for chills and malaise/fatigue.  HENT: Negative.    Eyes:        Yellow discoloration of the sclera  Respiratory: Negative.    Cardiovascular: Negative.   Gastrointestinal:  Positive for abdominal pain, nausea and vomiting.  Genitourinary: Negative.   Musculoskeletal: Negative.   Neurological: Negative.   Endo/Heme/Allergies: Negative.   Psychiatric/Behavioral: Negative.      Past Medical History:  Diagnosis Date   Anemia, iron deficiency    Diabetes mellitus without complication ([place])    on meds   Hearing loss    Bil hearing aids   Hyperlipidemia    Hypertension    Port-A-Cath in place 12/26/2020   Prediabetes    Sleep apnea     Past Surgical History:  Procedure Laterality Date   BILIARY STENT PLACEMENT N/A 11/09/2020   Procedure: BILIARY STENT PLACEMENT - 75 F X 5 CM STENT;  Surgeon: Rogene Houston, MD;  Location: [facility];  Service: Endoscopy;  Laterality: N/A;   BILIARY STENT PLACEMENT N/A 12/27/2020   Procedure: BILIARY STENT PLACEMENT;  Surgeon: Rogene Houston, MD;  Location: [facility];  Service: Endoscopy;  Laterality: N/A;   COLONOSCOPY     ENTEROSCOPY N/A 12/27/2021   Procedure: ENTEROSCOPY;  Surgeon: Mansouraty, Telford Nab., MD;  Location: [facility] ENDOSCOPY;  Service: Gastroenterology;  Laterality: N/A;   ERCP N/A 11/09/2020   Procedure: ENDOSCOPIC RETROGRADE CHOLANGIOPANCREATOGRAPHY (ERCP);  Surgeon: Rogene Houston, MD;  Location: [facility];  Service: Endoscopy;  Laterality: N/A;   ERCP N/A 12/27/2020   Procedure: ENDOSCOPIC RETROGRADE CHOLANGIOPANCREATOGRAPHY (ERCP);  Surgeon: Rogene Houston, MD;  Location: [facility];  Service: Endoscopy;  Laterality: N/A;    ESOPHAGOGASTRODUODENOSCOPY (EGD) WITH PROPOFOL N/A 11/30/2020   Procedure: ESOPHAGOGASTRODUODENOSCOPY (EGD) WITH PROPOFOL;  Surgeon: Milus Banister, MD;  Location: [facility] END[facility];  Service: Endoscopy;  Laterality: N/A;   EUS N/A 11/30/2020   Procedure: UPPER ENDOSCOPIC ULTRASOUND (EUS) RADIAL;  Surgeon: Milus Banister, MD;  Location: [facility];  Service: Endoscopy;  Laterality: N/A;   FINE NEEDLE ASPIRATION N/A 11/30/2020   Procedure: FINE NEEDLE ASPIRATION (FNA) LINEAR;  Surgeon: Milus Banister, MD;  Location: [facility]OS[facility];  Service: Endoscopy;  Laterality: N/A;   GIVENS CAPSULE STUDY     IR IMAGING GUIDED PORT INSERTION  12/18/2020   KNEE SURGERY     4 surgeries on left, 2 on right knee   SHOULDER SURGERY     right shoulder   SPHINCTEROTOMY N/A 11/09/2020   Procedure: SHORT BILIARY SPHINCTEROTOMY;  Surgeon: Rogene Houston, MD;  Location: [facility];  Service: Endoscopy;  Laterality: N/A;   WHIPPLE PROCEDURE       reports that he has been smoking cigars. He has never used smokeless tobacco. He reports current alcohol use. He reports that he does not use drugs.  Allergies  Allergen Reactions   Lisinopril Cough    Family History  Problem Relation Age of Onset   Heart attack Father    Heart attack Paternal Grandfather    Leukemia Sister    Heart attack Brother    Family history reviewed and not pertinent .  Prior to Admission medications   Medication Sig Start Date End Date Taking? Authorizing Provider  apixaban (ELIQUIS) 5 MG TABS tablet Take 1 tablet (5 mg total) by mouth 2 (two) times daily. 12/19/21  Yes Luking, Elayne Snare, MD  azelastine (ASTELIN) 0.1 % nasal spray Place 2 sprays into both nostrils 2 (two) times daily. Patient taking differently: Place 2 sprays into both nostrils 2 (two) times daily as needed for allergies. 11/23/21  Yes Luking, Scott A, MD  insulin detemir (LEVEMIR) 100 UNIT/ML FlexPen Inject 8 Units into the skin at bedtime. 07/12/21  Yes Nida, Marella Chimes, MD  insulin lispro (HUMALOG KWIKPEN) 100 UNIT/ML KwikPen Inject 4-7 Units into the skin 3 (three) times daily before meals. 07/12/21  Yes Cassandria Anger, MD  levothyroxine (SYNTHROID) 175 MCG tablet TAKE 1 TABLET DAILY Patient taking differently: Take 175 mcg by mouth daily before breakfast. 10/17/21  Yes Luking, Elayne Snare, MD  levothyroxine (SYNTHROID) 25 MCG tablet Take one tablet po daily but on Sunday take 1 tablet Patient taking differently: Take 25-50 mcg by mouth as directed. Take 2 tablets (50 mcg) Mon-Sat along with 175 mcg tablet but Take only 1 tablet (25 mcg) on Sunday along with 175 mcg tablet 10/23/21  Yes Luking, Elayne Snare, MD  lipase/protease/amylase (CREON) 36000 UNITS CPEP capsule Take 2 capsules (72,000 Units total) by mouth 3 (three) times daily with meals. May also take 1 capsule (36,000 Units total) as needed (with snacks). Patient taking differently: Take 1 capsule (36,000 Units total) by mouth 3 (three) times daily with meals. May also take 1 capsule (36,000 Units total) as needed (with snacks). 05/31/21  Yes Luking, Elayne Snare, MD  oxyCODONE (OXY IR/ROXICODONE) 5 MG immediate release tablet Take 5 mg by mouth every 6 (six) hours as needed for severe pain.   Yes [provider]  sildenafil (  VIAGRA) 100 MG tablet TAKE 1/2 TO 1 TABLET BY MOUTH AS NEEDED FOR ERECTILE DYSFUNCTION Patient taking differently: Take 50-100 mg by mouth as needed for erectile dysfunction. 12/03/21  Yes Luking, Elayne Snare, MD  clobetasol cream (TEMOVATE) 3.32 % Apply 1 application. topically 2 (two) times daily. Patient not taking: Reported on 02/01/2022 09/17/21   Derek Jack, MD  Continuous Blood Gluc Sensor (FREESTYLE LIBRE 3 SENSOR) MISC 1 Piece by Does not apply route every 14 (fourteen) days. Place 1 sensor on the skin every 14 days. Use to check glucose continuously Dx E11.65 10/22/21   Cassandria Anger, MD  oxyCODONE-acetaminophen (PERCOCET/ROXICET) 5-325 MG tablet Take 1 tablet by  mouth every 6 (six) hours as needed for severe pain. Patient not taking: Reported on 02/01/2022 05/02/21   [name], MD    Physical Exam: Vitals:   02/02/22 0645 02/02/22 0700 02/02/22 0702 02/02/22 0738  BP:  114/76  124/75  Pulse: (!) 106 (!) 104 (!) 103 98  Resp: 20 (!) '28 17 19  '$ Temp:    99.2 F (37.3 C)  TempSrc:    Oral  SpO2: 96% 94% 97% 95%  Weight:      Height:        Constitutional: Appears comfortable, not in any acute distress.  Deconditioned, appears yellow. Vitals:   02/02/22 0645 02/02/22 0700 02/02/22 0702 02/02/22 0738  BP:  114/76  124/75  Pulse: (!) 106 (!) 104 (!) 103 98  Resp: 20 (!) '28 17 19  '$ Temp:    99.2 F (37.3 C)  TempSrc:    Oral  SpO2: 96% 94% 97% 95%  Weight:      Height:       Eyes: PERRL, lids and conjunctivae normal ENMT: Mucous membranes are moist.  Posterior pharynx without exudate. Neck: normal, supple, no masses, no thyromegaly Respiratory: CTA bilaterally, normal respiratory effort, no accessory muscle use.  RR 15 Cardiovascular: S1-S2 heard, regular rate and rhythm, no murmur. Abdomen: Abdomen is soft, mildly tender RUQ, nondistended, BS+ Musculoskeletal: No edema, no cyanosis, no clubbing. Skin: no rashes, lesions, ulcers. No induration Neurologic: CN 2-12 grossly intact. Sensation intact, DTR normal. Strength 5/5 in all 4.  Psychiatric: Normal judgment and insight. Alert and oriented x 3. Normal mood.    Labs on Admission: I have personally reviewed following labs and imaging studies  CBC: Recent Labs  Lab 01/28/22 1114 02/01/22 2230  WBC 5.4 7.8  NEUTROABS 4.0 5.9  HGB 12.2* 10.4*  HCT 36.7* 30.5*  MCV 92.2 91.6  PLT 363 951   Basic Metabolic Panel: Recent Labs  Lab 01/28/22 1114 02/01/22 2230  NA 134* 131*  K 3.8 3.7  CL 103 102  CO2 24 23  GLUCOSE 110* 144*  BUN 9 19  CREATININE 0.72 0.59*  CALCIUM 8.0* 7.7*  MG 2.0 1.9   GFR: Estimated Creatinine Clearance: 107.1 mL/min (A) (by C-G formula  based on SCr of 0.59 mg/dL (L)). Liver Function Tests: Recent Labs  Lab 01/28/22 1114 02/01/22 2230  AST 76* 93*  ALT 49* 62*  ALKPHOS 343* 372*  BILITOT 7.5* 13.1*  PROT 6.1* 5.9*  ALBUMIN 2.2* 2.0*   Recent Labs  Lab 02/01/22 2230  LIPASE 16   No results for input(s): "AMMONIA" in the last 168 hours. Coagulation Profile: No results for input(s): "INR", "PROTIME" in the last 168 hours. Cardiac Enzymes: No results for input(s): "CKTOTAL", "CKMB", "CKMBINDEX", "TROPONINI" in the last 168 hours. BNP (last 3 results) No results for  input(s): "PROBNP" in the last 8760 hours. HbA1C: No results for input(s): "HGBA1C" in the last 72 hours. CBG: Recent Labs  Lab 02/01/22 2330  GLUCAP 151*   Lipid Profile: No results for input(s): "CHOL", "HDL", "LDLCALC", "TRIG", "CHOLHDL", "LDLDIRECT" in the last 72 hours. Thyroid Function Tests: No results for input(s): "TSH", "T4TOTAL", "FREET4", "T3FREE", "THYROIDAB" in the last 72 hours. Anemia Panel: No results for input(s): "VITAMINB12", "FOLATE", "FERRITIN", "TIBC", "IRON", "RETICCTPCT" in the last 72 hours. Urine analysis:    Component Value Date/Time   COLORURINE AMBER (A) 02/01/2022 2310   APPEARANCEUR CLEAR 02/01/2022 2310   LABSPEC 1.029 02/01/2022 2310   PHURINE 5.0 02/01/2022 2310   GLUCOSEU NEGATIVE 02/01/2022 2310   HGBUR MODERATE (A) 02/01/2022 2310   BILIRUBINUR MODERATE (A) 02/01/2022 2310   KETONESUR NEGATIVE 02/01/2022 2310   PROTEINUR 30 (A) 02/01/2022 2310   NITRITE NEGATIVE 02/01/2022 2310   LEUKOCYTESUR NEGATIVE 02/01/2022 2310    Radiological Exams on Admission: US Abdomen Limited RUQ (LIVER/GB)  Result Date: 02/01/2022 CLINICAL DATA:  Right upper quadrant pain EXAM: ULTRASOUND ABDOMEN LIMITED RIGHT UPPER QUADRANT COMPARISON:  CT 01/29/2022 FINDINGS: Gallbladder: Surgically absent Common bile duct: Diameter: Dilated common bile duct measuring up to 12 mm. There is hypoechoic material or tissue within the  bile duct. This is avascular. Liver: No focal hepatic abnormality. Marked intra hepatic biliary dilatation. The liver is echogenic. Portal vein is patent on color Doppler imaging with normal direction of blood flow towards the liver. Other: None. IMPRESSION: 1. Marked intra and extrahepatic biliary dilatation with hypoechoic debris/material versus hypovascular tissue in the common bile duct. 2. Echogenic liver parenchyma consistent with hepatic steatosis and or hepatocellular disease. Electronically Signed   By: Donavan Foil M.D.   On: 02/01/2022 20:47    EKG: EKG ordered please review.  Assessment/Plan Principal Problem:   Choledocholithiasis Active Problems:   Essential hypertension, benign   Obstructive sleep apnea   Hypothyroidism   Type 2 diabetes mellitus with stage 3a chronic kidney disease, with long-term current use of insulin (HCC)   Mixed hyperlipidemia   Pancreatic adenocarcinoma (HCC)   Obstructive jaundice due to recurrent pancreatic cancer: Patient presented with RUQ pain, nausea, vomiting, subjective fever and yellow discoloration.  Patient recently underwent pancreatic biopsy in [facility] in [place].  No leukocytosis, no lactic acidosis. CT abdomen shows high-grade biliary obstruction at the choledocho jejunostomy.  GI consulted recommended IR consultation for percutaneous drainage. Endoscopic decompression would be difficult given increased pancreatic cancer burden.   Continue IV Zosyn for intra-abdominal infection Continue IV hydration, adequate pain control with oxycodone. Continue to trend liver enzymes. IR is consulted , Patient is scheduled for biliary placement with cholangiogram  Essential hypertension: Blood pressure controlled off medication.  Type 2 diabetes: Hold p.o. diabetic medications Carb modified diet, hemoglobin A1c, Continue regular insulin sliding scale  History of DVT: Continue Eliquis  Obstructive sleep apnea: Continue CPAP at  night.  Hypothyroidism: Continue levothyroxine  Hyperlipidemia: Hold statins  Hyponatremia: Could be secondary to dehydration.   Continue IV hydration monitor serum sodium   DVT prophylaxis: Eliquis Code Status: Full code Family Communication: No family at bed side Disposition Plan:  Home Consults called: Consult Gastro Admission status: Inpatient   Shawna Clamp MD [facility]   If 7PM-7AM, please contact night-coverage [email or web address] Password [email or web address]  02/02/2022, 8:02 AM

## 2022-02-02 NOTE — Progress Notes (Signed)
ANTICOAGULATION CONSULT NOTE - Initial Consult  Pharmacy Consult for heparin Indication: history of VTE, apixaban on hold  Allergies  Allergen Reactions   Lisinopril Cough    Patient Measurements: Height: '6\' 3"'$  (190.5 cm) Weight: 81.2 kg (179 lb 0.2 oz) IBW/kg (Calculated) : 84.5 Heparin Dosing Weight: n/a. Use TBW  Vital Signs: Temp: 99.5 F (37.5 C) (09/16 1641) Temp Source: Oral (09/16 1641) BP: 161/88 (09/16 1641) Pulse Rate: 85 (09/16 1641)  Labs: Recent Labs    02/01/22 2230  HGB 10.4*  HCT 30.5*  PLT 354  CREATININE 0.59*    Estimated Creatinine Clearance: 107.1 mL/min (A) (by C-G formula based on SCr of 0.59 mg/dL (L)).   Medical History: Past Medical History:  Diagnosis Date   Anemia, iron deficiency    Diabetes mellitus without complication ([place])    on meds   Hearing loss    Bil hearing aids   Hyperlipidemia    Hypertension    Port-A-Cath in place 12/26/2020   Prediabetes    Sleep apnea     Medications: Pt on apixaban 5 mg PO BID prior to admission  -Last dose: 9/15 @ 0715  Assessment: Pt is a 64 yoM presenting with RUQ pain, N/V, and jaundice. PMH significant for pancreatic cancer, CKD, history of DVT (on apixaban). IR consulted  - apixaban to be held for possible invasive procedures. Pharmacy consulted to manage heparin drip while apixaban on hold.   Today, 02/02/22 CBC: Hgb low, Plt WNL SCr WNL STAT baseline aPTT and HL ordered and need to be obtained prior to initiation of heparin drip. Anticipate baseline HL to be falsely elevated due to recent DOAC  Goal of Therapy:  Heparin level 0.3-0.7 units/ml aPTT 66-102 seconds Monitor platelets by anticoagulation protocol: Yes   Plan:  Heparin bolus of 2500 units IV once Initiate heparin infusion at 1300 units/hr Check HL/aPTT 6 hours after infusion started.  CBC, HL/aPTT daily  Monitor for signs of bleeding  Will need guidance from IR on when to hold heparin prior to any procedures.    Lenis Noon, PharmD 02/02/2022,7:20 PM

## 2022-02-02 NOTE — ED Notes (Signed)
Pt ambulated to the BR without assistance.

## 2022-02-02 NOTE — Consult Note (Signed)
Reason for Consult: Obstructive jaundice Referring Physician: Hospital team  Alexander Banks is an 66 y.o. male.  HPI: Patient seen and examined in his hospital computer chart reviewed including the [facility] work-up on care everywhere and he has had dark urine and increasing pain for the last few weeks and Dr. Donneta Romberg attempts at reaching the anastomosis was reviewed and he did have an EUS which biopsied the recurrent mass in [place] which was positive and his CT was reviewed and he has had some periodic p.m. fevers and he did get a preop round of chemo which he said his cancer grew and a postop round of chemo and he said his tumor recurred during that and he has no other complaints  Past Medical History:  Diagnosis Date   Anemia, iron deficiency    Diabetes mellitus without complication ([place])    on meds   Hearing loss    Bil hearing aids   Hyperlipidemia    Hypertension    Port-A-Cath in place 12/26/2020   Prediabetes    Sleep apnea     Past Surgical History:  Procedure Laterality Date   BILIARY STENT PLACEMENT N/A 11/09/2020   Procedure: BILIARY STENT PLACEMENT - 10 F X 5 CM STENT;  Surgeon: Rogene Houston, MD;  Location: [facility];  Service: Endoscopy;  Laterality: N/A;   BILIARY STENT PLACEMENT N/A 12/27/2020   Procedure: BILIARY STENT PLACEMENT;  Surgeon: Rogene Houston, MD;  Location: [facility];  Service: Endoscopy;  Laterality: N/A;   COLONOSCOPY     ENTEROSCOPY N/A 12/27/2021   Procedure: ENTEROSCOPY;  Surgeon: Mansouraty, Telford Nab., MD;  Location: [facility] ENDOSCOPY;  Service: Gastroenterology;  Laterality: N/A;   ERCP N/A 11/09/2020   Procedure: ENDOSCOPIC RETROGRADE CHOLANGIOPANCREATOGRAPHY (ERCP);  Surgeon: Rogene Houston, MD;  Location: [facility];  Service: Endoscopy;  Laterality: N/A;   ERCP N/A 12/27/2020   Procedure: ENDOSCOPIC RETROGRADE CHOLANGIOPANCREATOGRAPHY (ERCP);  Surgeon: Rogene Houston, MD;  Location: [facility];  Service: Endoscopy;  Laterality: N/A;    ESOPHAGOGASTRODUODENOSCOPY (EGD) WITH PROPOFOL N/A 11/30/2020   Procedure: ESOPHAGOGASTRODUODENOSCOPY (EGD) WITH PROPOFOL;  Surgeon: Milus Banister, MD;  Location: [facility] ENDOSCOPY;  Service: Endoscopy;  Laterality: N/A;   EUS N/A 11/30/2020   Procedure: UPPER ENDOSCOPIC ULTRASOUND (EUS) RADIAL;  Surgeon: Milus Banister, MD;  Location: [facility] END[facility];  Service: Endoscopy;  Laterality: N/A;   FINE NEEDLE ASPIRATION N/A 11/30/2020   Procedure: FINE NEEDLE ASPIRATION (FNA) LINEAR;  Surgeon: Milus Banister, MD;  Location: [facility]OS[facility];  Service: Endoscopy;  Laterality: N/A;   GIVENS CAPSULE STUDY     IR IMAGING GUIDED PORT INSERTION  12/18/2020   KNEE SURGERY     4 surgeries on left, 2 on right knee   SHOULDER SURGERY     right shoulder   SPHINCTEROTOMY N/A 11/09/2020   Procedure: SHORT BILIARY SPHINCTEROTOMY;  Surgeon: Rogene Houston, MD;  Location: [facility];  Service: Endoscopy;  Laterality: N/A;   WHIPPLE PROCEDURE      Family History  Problem Relation Age of Onset   Heart attack Father    Heart attack Paternal Grandfather    Leukemia Sister    Heart attack Brother     Social History:  reports that he has been smoking cigars. He has never used smokeless tobacco. He reports current alcohol use. He reports that he does not use drugs.  Allergies:  Allergies  Allergen Reactions   Lisinopril Cough    Medications: I have reviewed the patient's current medications.  Results for orders placed or performed during the hospital encounter of 02/01/22 (from the past 48 hour(s))  CBC with Differential     Status: Abnormal   Collection Time: 02/01/22 10:30 PM  Result Value Ref Range   WBC 7.8 4.0 - 10.5 K/uL   RBC 3.33 (L) 4.22 - 5.81 MIL/uL   Hemoglobin 10.4 (L) 13.0 - 17.0 g/dL   HCT 30.5 (L) 39.0 - 52.0 %   MCV 91.6 80.0 - 100.0 fL   MCH 31.2 26.0 - 34.0 pg   MCHC 34.1 30.0 - 36.0 g/dL   RDW 19.9 (H) 11.5 - 15.5 %   Platelets 354 150 - 400 K/uL   nRBC 0.0 0.0 - 0.2 %   Neutrophils  Relative % 76 %   Neutro Abs 5.9 1.7 - 7.7 K/uL   Lymphocytes Relative 13 %   Lymphs Abs 1.0 0.7 - 4.0 K/uL   Monocytes Relative 10 %   Monocytes Absolute 0.8 0.1 - 1.0 K/uL   Eosinophils Relative 0 %   Eosinophils Absolute 0.0 0.0 - 0.5 K/uL   Basophils Relative 0 %   Basophils Absolute 0.0 0.0 - 0.1 K/uL   Immature Granulocytes 1 %   Abs Immature Granulocytes 0.05 0.00 - 0.07 K/uL    Comment: Performed at [facility], [place] [address], [place], [place] [address]  Comprehensive metabolic panel     Status: Abnormal   Collection Time: 02/01/22 10:30 PM  Result Value Ref Range   Sodium 131 (L) 135 - 145 mmol/L   Potassium 3.7 3.5 - 5.1 mmol/L   Chloride 102 98 - 111 mmol/L   CO2 23 22 - 32 mmol/L   Glucose, Bld 144 (H) 70 - 99 mg/dL    Comment: Glucose reference range applies only to samples taken after fasting for at least 8 hours.   BUN 19 8 - 23 mg/dL   Creatinine, Ser 0.59 (L) 0.61 - 1.24 mg/dL   Calcium 7.7 (L) 8.9 - 10.3 mg/dL   Total Protein 5.9 (L) 6.5 - 8.1 g/dL   Albumin 2.0 (L) 3.5 - 5.0 g/dL   AST 93 (H) 15 - 41 U/L   ALT 62 (H) 0 - 44 U/L   Alkaline Phosphatase 372 (H) 38 - 126 U/L   Total Bilirubin 13.1 (H) 0.3 - 1.2 mg/dL   GFR, Estimated >60 >60 mL/min    Comment: (NOTE) Calculated using the CKD-EPI Creatinine Equation (2021)    Anion gap 6 5 - 15    Comment: Performed at [facility], [place] [address] [place], [place] [address]  Lipase, blood     Status: None   Collection Time: 02/01/22 10:30 PM  Result Value Ref Range   Lipase 16 11 - 51 U/L    Comment: Performed at [facility], [place] [address] [place], [place] [address]  Magnesium     Status: None   Collection Time: 02/01/22 10:30 PM  Result Value Ref Range   Magnesium 1.9 1.7 - 2.4 mg/dL    Comment: Performed at [facility], [place] [address], [place], [place] [address]  Blood culture (routine x 2)     Status: None (Preliminary  result)   Collection Time: 02/01/22 10:30 PM   Specimen: Porta Cath; Blood  Result Value Ref Range   Specimen Description      PORTA CATH Performed at [facility], [place] [address] [place], [place] [address]    Special Requests  BOTTLES DRAWN AEROBIC AND ANAEROBIC Blood Culture adequate volume Performed at [place] [address] [place], [place] [address]    Culture      NO GROWTH < 12 HOURS Performed at [place] [address] [place], [place] [address]    Report Status PENDING   Lactic acid, plasma     Status: None   Collection Time: 02/01/22 10:30 PM  Result Value Ref Range   Lactic Acid, Venous 1.1 0.5 - 1.9 mmol/L    Comment: Performed at [facility], [place] [address], [place], [place] [address]  Blood culture (routine x 2)     Status: None (Preliminary result)   Collection Time: 02/01/22 10:46 PM   Specimen: BLOOD  Result Value Ref Range   Specimen Description      BLOOD RUA Performed at [place] [address], [place], [place] [address]    Special Requests      BOTTLES DRAWN AEROBIC AND ANAEROBIC Blood Culture results may not be optimal due to an excessive volume of blood received in culture bottles Performed at [place] [address], [place], [place] [address]    Culture      NO GROWTH < 12 HOURS Performed at [place] [address] [place], [place] [address]    Report Status PENDING   Urinalysis, Routine w reflex microscopic Urine, Clean Catch     Status: Abnormal   Collection Time: 02/01/22 11:10 PM  Result Value Ref Range   Color, Urine AMBER (A) YELLOW    Comment: BIOCHEMICALS MAY BE AFFECTED BY COLOR   APPearance CLEAR CLEAR   Specific Gravity, Urine 1.029 1.005 - 1.030   pH 5.0 5.0 - 8.0   Glucose, UA NEGATIVE NEGATIVE mg/dL   Hgb urine dipstick MODERATE (A) NEGATIVE   Bilirubin Urine MODERATE (A) NEGATIVE    Ketones, ur NEGATIVE NEGATIVE mg/dL   Protein, ur 30 (A) NEGATIVE mg/dL   Nitrite NEGATIVE NEGATIVE   Leukocytes,Ua NEGATIVE NEGATIVE   RBC / HPF 6-10 0 - 5 RBC/hpf   WBC, UA 0-5 0 - 5 WBC/hpf   Bacteria, UA RARE (A) NONE SEEN   Squamous Epithelial / LPF 0-5 0 - 5   Mucus PRESENT     Comment: Performed at [facility], [place] [address] [place],  [address]  CBG monitoring, ED     Status: Abnormal   Collection Time: 02/01/22 11:30 PM  Result Value Ref Range   Glucose-Capillary 151 (H) 70 - 99 mg/dL    Comment: Glucose reference range applies only to samples taken after fasting for at least 8 hours.    US Abdomen Limited RUQ (LIVER/GB)  Result Date: 02/01/2022 CLINICAL DATA:  Right upper quadrant pain EXAM: ULTRASOUND ABDOMEN LIMITED RIGHT UPPER QUADRANT COMPARISON:  CT 01/29/2022 FINDINGS: Gallbladder: Surgically absent Common bile duct: Diameter: Dilated common bile duct measuring up to 12 mm. There is hypoechoic material or tissue within the bile duct. This is avascular. Liver: No focal hepatic abnormality. Marked intra hepatic biliary dilatation. The liver is echogenic. Portal vein is patent on color Doppler imaging with normal direction of blood flow towards the liver. Other: None. IMPRESSION: 1. Marked intra and extrahepatic biliary dilatation with hypoechoic debris/material versus hypovascular tissue in the common bile duct. 2. Echogenic liver parenchyma consistent with hepatic steatosis and or hepatocellular disease. Electronically Signed   By: Donavan Foil M.D.   On: 02/01/2022 20:47  Review of Systems negative except above he does have abdominal pain which radiates to his back Blood pressure 124/75, pulse 98, temperature 99.2 F (37.3 C), temperature source [place], resp. rate 19, height '6\' 3"'$  (1.905 m), weight 81.2 kg, SpO2 95 %. Physical Exam vital signs stable currently afebrile no acute distress minimal midepigastric discomfort soft labs and CT  reviewed  Assessment/Plan: Obstructive jaundice due to recurrent pancreatic cancer Plan: We discussed difficulties in endoscopic decompression and we discussed interventional radiology and going the percutaneous route and usually requiring an external drain for a few weeks up to 6 weeks however he can discuss that further with them and see if they are willing to place it internally on the first attempt and I also gave him the option of trying to get in touch with his [facility] doctors in transfer or discharge from here and returned to [place] and we answered all of his questions and please call us if we could be of any further assistance with this hospital stay and I wish the patient well  Darleth Eustache E 02/02/2022, 8:34 AM

## 2022-02-02 NOTE — ED Notes (Signed)
Pt's O2 ranging 88-90% on RA. Pt placed on 2lpm [place], O2 increased 95%.

## 2022-02-02 NOTE — Progress Notes (Signed)
Pharmacy Antibiotic Note  Alexander Banks is a 66 y.o. male admitted on 02/01/2022 with  IAI .  Pharmacy has been consulted for Zosyn dosing.  Plan: Zosyn 3.375g IV q8h (4 hour infusion).  Height: '6\' 3"'$  (190.5 cm) Weight: 81.2 kg (179 lb 0.2 oz) IBW/kg (Calculated) : 84.5  Temp (24hrs), Avg:99.2 F (37.3 C), Min:98.9 F (37.2 C), Max:99.4 F (37.4 C)  Recent Labs  Lab 01/28/22 1114 02/01/22 2230  WBC 5.4 7.8  CREATININE 0.72 0.59*  LATICACIDVEN  --  1.1    Estimated Creatinine Clearance: 107.1 mL/min (A) (by C-G formula based on SCr of 0.59 mg/dL (L)).    Allergies  Allergen Reactions   Lisinopril Cough   Dose age will likely remain stable at above dosage.    Will sign off at this time.  Please reconsult if a change in clinical status warrants re-evaluation of dosage.    Thank you for allowing pharmacy to be a part of this patient's care.   Royetta Asal, PharmD, BCPS 02/02/2022 8:16 AM

## 2022-02-03 DIAGNOSIS — K8050 Calculus of bile duct without cholangitis or cholecystitis without obstruction: Secondary | ICD-10-CM | POA: Diagnosis not present

## 2022-02-03 LAB — COMPREHENSIVE METABOLIC PANEL
ALT: 57 U/L — ABNORMAL HIGH (ref 0–44)
AST: 94 U/L — ABNORMAL HIGH (ref 15–41)
Albumin: 1.8 g/dL — ABNORMAL LOW (ref 3.5–5.0)
Alkaline Phosphatase: 346 U/L — ABNORMAL HIGH (ref 38–126)
Anion gap: 6 (ref 5–15)
BUN: 12 mg/dL (ref 8–23)
CO2: 22 mmol/L (ref 22–32)
Calcium: 7.7 mg/dL — ABNORMAL LOW (ref 8.9–10.3)
Chloride: 102 mmol/L (ref 98–111)
Creatinine, Ser: 0.55 mg/dL — ABNORMAL LOW (ref 0.61–1.24)
GFR, Estimated: >60 mL/min (ref >60)
Glucose, Bld: 105 mg/dL — ABNORMAL HIGH (ref 70–99)
Potassium: 3.5 mmol/L (ref 3.5–5.1)
Sodium: 130 mmol/L — ABNORMAL LOW (ref 135–145)
Total Bilirubin: 15.9 mg/dL — ABNORMAL HIGH (ref 0.3–1.2)
Total Protein: 5.9 g/dL — ABNORMAL LOW (ref 6.5–8.1)

## 2022-02-03 LAB — URINE CULTURE: Culture: NO GROWTH

## 2022-02-03 LAB — CBC
HCT: 30.5 % — ABNORMAL LOW (ref 39.0–52.0)
Hemoglobin: 10.4 g/dL — ABNORMAL LOW (ref 13.0–17.0)
MCH: 31.3 pg (ref 26.0–34.0)
MCHC: 34.1 g/dL (ref 30.0–36.0)
MCV: 91.9 fL (ref 80.0–100.0)
Platelets: 387 10*3/uL (ref 150–400)
RBC: 3.32 MIL/uL — ABNORMAL LOW (ref 4.22–5.81)
RDW: 20.1 % — ABNORMAL HIGH (ref 11.5–15.5)
WBC: 9.9 10*3/uL (ref 4.0–10.5)
nRBC: 0 % (ref 0.0–0.2)

## 2022-02-03 LAB — PHOSPHORUS: Phosphorus: 3.1 mg/dL (ref 2.5–4.6)

## 2022-02-03 LAB — GLUCOSE, CAPILLARY
Glucose-Capillary: 56 mg/dL — ABNORMAL LOW (ref 70–99)
Glucose-Capillary: 82 mg/dL (ref 70–99)
Glucose-Capillary: 145 mg/dL — ABNORMAL HIGH (ref 70–99)
Glucose-Capillary: 108 mg/dL — ABNORMAL HIGH (ref 70–99)
Glucose-Capillary: 150 mg/dL — ABNORMAL HIGH (ref 70–99)

## 2022-02-03 LAB — HEPARIN LEVEL (UNFRACTIONATED)
Heparin Unfractionated: <0.1 IU/mL — ABNORMAL LOW (ref 0.30–0.70)
Heparin Unfractionated: <0.1 IU/mL — ABNORMAL LOW (ref 0.30–0.70)

## 2022-02-03 LAB — MAGNESIUM: Magnesium: 1.8 mg/dL (ref 1.7–2.4)

## 2022-02-03 MED ORDER — HEPARIN BOLUS VIA INFUSION
2000.0000 [IU] | Freq: Once | INTRAVENOUS | Status: AC
  Administered 2022-02-03: 2000 [IU] via INTRAVENOUS
  Filled 2022-02-03: qty 2000

## 2022-02-03 MED ORDER — HEPARIN (PORCINE) 25000 UT/250ML-% IV SOLN: 2200.0000 [IU]/h | INTRAVENOUS | Status: AC

## 2022-02-03 MED ORDER — OXYCODONE HCL 5 MG PO TABS
5.0000 mg | ORAL_TABLET | ORAL | Status: DC | PRN
  Administered 2022-02-03 – 2022-02-05 (×4): 5 mg via ORAL
  Filled 2022-02-03 (×4): qty 1

## 2022-02-03 MED ORDER — HEPARIN BOLUS VIA INFUSION
2500.0000 [IU] | Freq: Once | INTRAVENOUS | Status: AC
  Administered 2022-02-03: 2500 [IU] via INTRAVENOUS
  Filled 2022-02-03: qty 2500

## 2022-02-03 NOTE — Progress Notes (Signed)
ANTICOAGULATION CONSULT NOTE - Follow Up  Pharmacy Consult for heparin Indication: history of VTE, apixaban on hold  Allergies  Allergen Reactions   Lisinopril Cough    Patient Measurements: Height: '6\' 3"'$  (190.5 cm) Weight: 81.2 kg (179 lb 0.2 oz) IBW/kg (Calculated) : 84.5 Heparin Dosing Weight: n/a. Use TBW  Vital Signs: Temp: 98 F (36.7 C) (09/17 1318) Temp Source: [place] (09/17 1318) BP: 160/87 (09/17 1318) Pulse Rate: 93 (09/17 1318)  Labs: Recent Labs    02/01/22 2230 02/02/22 2202 02/03/22 0500 02/03/22 1956  HGB 10.4* 10.3* 10.4*  --   HCT 30.5* 29.3* 30.5*  --   PLT 354 369 387  --   APTT  --  31  --   --   HEPARINUNFRC  --  <0.10* <0.10* <0.10*  CREATININE 0.59*  --  0.55*  --      Estimated Creatinine Clearance: 107.1 mL/min (A) (by C-G formula based on SCr of 0.55 mg/dL (L)).   Medical History: Past Medical History:  Diagnosis Date   Anemia, iron deficiency    Diabetes mellitus without complication ([place])    on meds   Hearing loss    Bil hearing aids   Hyperlipidemia    Hypertension    Port-A-Cath in place 12/26/2020   Prediabetes    Sleep apnea     Medications: Pt on apixaban 5 mg PO BID prior to admission  -Last dose: 9/15 @ 0715  Assessment: Pt is a 45 yoM presenting with RUQ pain, N/V, and jaundice. PMH significant for pancreatic cancer, CKD, history of DVT (on apixaban). IR consulted  - apixaban to be held for possible invasive procedures. Pharmacy consulted to manage heparin drip while apixaban on hold.   Baseline HL undetectable so able to monitor using HL.  Today, 02/03/22 HL <0.10 remains subtherapeutic despite increasing rate of heparin infusion to 1600 units/hr CBC: Hgb low but stable, Plt WNL and stable Confirmed with RN that heparin infusing via peripheral line and infusing at correct rate. No line issues or interruptions. HL obtained by IV team from port-a-cath.   Goal of Therapy:  Heparin level 0.3-0.7 units/ml Monitor  platelets by anticoagulation protocol: Yes   Plan:  Heparin bolus of 2000 units IV once Increase heparin infusion to 1900 units/hr Check HL 6 hours after rate change CBC, HL daily  Monitor for signs of bleeding  Per IR, heparin infusion to be turned off at 0600 tomorrow morning (9/18). Follow up on anticoagulation plan post-procedure.   Lenis Noon, PharmD 02/03/2022,8:49 PM

## 2022-02-03 NOTE — Progress Notes (Signed)
[facility] for heparin Indication: history of VTE, apixaban on hold  Allergies  Allergen Reactions   Lisinopril Cough    Patient Measurements: Height: '6\' 3"'$  (190.5 cm) Weight: 81.2 kg (179 lb 0.2 oz) IBW/kg (Calculated) : 84.5 Heparin Dosing Weight: n/a. Use TBW  Vital Signs: Temp: 98.2 F (36.8 C) (09/17 0406) Temp Source: [place] (09/17 0406) BP: 135/86 (09/17 0406) Pulse Rate: 90 (09/17 0406)  Labs: Recent Labs    02/01/22 2230 02/02/22 2202 02/03/22 0500  HGB 10.4* 10.3* 10.4*  HCT 30.5* 29.3* 30.5*  PLT 354 369 387  APTT  --  31  --   HEPARINUNFRC  --  <0.10* <0.10*  CREATININE 0.59*  --   --      Estimated Creatinine Clearance: 107.1 mL/min (A) (by C-G formula based on SCr of 0.59 mg/dL (L)).   Medical History: Past Medical History:  Diagnosis Date   Anemia, iron deficiency    Diabetes mellitus without complication ([place])    on meds   Hearing loss    Bil hearing aids   Hyperlipidemia    Hypertension    Port-A-Cath in place 12/26/2020   Prediabetes    Sleep apnea     Medications: Pt on apixaban 5 mg PO BID prior to admission  -Last dose: 9/15 @ 0715  Assessment: Pt is a 23 yoM presenting with RUQ pain, N/V, and jaundice. PMH significant for pancreatic cancer, CKD, history of DVT (on apixaban). IR consulted  - apixaban to be held for possible invasive procedures. Pharmacy consulted to manage heparin drip while apixaban on hold.   Today, 02/03/22 HL <0.10, subtherapeutic CBC: Hgb low, Plt WNL SCr WNL No line or bleeding issues per RN   Goal of Therapy:  Heparin level 0.3-0.7 units/ml aPTT 66-102 seconds Monitor platelets by anticoagulation protocol: Yes   Plan:  Heparin bolus of 2500 units IV once Increase  heparin infusion at 1600 units/hr Check HL 8  hours after rate change  CBC, HL/aPTT daily  Monitor for signs of bleeding  Will need guidance from IR on when to hold heparin prior to any  procedures.   Royetta Asal, PharmD, BCPS 02/03/2022 9:23 AM

## 2022-02-03 NOTE — Consult Note (Signed)
Chief Complaint: Patient was seen in consultation today for obstructive jaundice  Referring Physician(s): Shawna Clamp, MD  Supervising Physician: Aletta Edouard  Patient Status: [facility] - In-pt  History of Present Illness: Alexander Banks is a 66 y.o. male with a past medical history significant for OSA, hearing loss, anemia, HTN, HLD, DM, CKD, recent BLE DVT on Eliquis and pancreatic cancer diagnosed 2022 s/p Whipple procedure and chemotherapy who presented to [facility] ED 02/01/22 with complaints of abdominal pain, fever, yellow coloring of skin, nausea and vomiting since having a pancreas biopsy 1 week prior in [place]. Per chart review results of this biopsy showed recurrent pancreatic adenocarcinoma. Mr. Schlink presented to his oncologist's office several days prior with similar symptoms and CT abd/pelvis w/ and w/o contrast was obtained on 01/30/22 which showed:  1. Enlarging hypoenhancing masses at the pancreatico-jejunal anastomosis and portocaval space consistent with progressive local tumor recurrence. There is resulting high-grade biliary obstruction at the choledocho jejunostomy. In addition, there is progressive pancreatic ductal dilatation with a submucosal fluid collection in the posterior wall of the stomach which may reflect a small pseudocyst. 2. The masses in the porta hepatis cause progressive encasement and narrowing of the superior mesenteric artery and vein. 3. No distant metastases or peritoneal nodularity identified. 4. No findings highly suspicious for thoracic metastatic disease. Patchy ground-glass and clustered pulmonary nodularity is likely inflammatory. 5. Patient may benefit from percutaneous biliary decompression.  His HP[facility]/GI team at [facility] was contacted by [facility] oncology PA and it was recommended that IR be contacted for possible PTC/biliary drain placement with plans to return to their office after to discuss possible repeat ERCP and stenting. When  he was called to discuss this he reported fever and was sent to the ED due to concern for cholangitis.  Work up in the ED significant for tachycardia but otherwise stable, noted to be afebrile at time of admission. RUQ US showed:  1. Marked intra and extrahepatic biliary dilatation with hypoechoic debris/material versus hypovascular tissue in the common bile duct. 2. Echogenic liver parenchyma consistent with hepatic steatosis and or hepatocellular disease.  IR has been consulted for PTC/biliary drain placement for biliary decompression.  Patient seen in his room, several family members present during discussion. He denies any complaints right now because does have intermittent abdominal pain still. He is hopeful he can go home soon after drain placement. He is agreeable to proceed.  Past Medical History:  Diagnosis Date   Anemia, iron deficiency    Diabetes mellitus without complication ([place])    on meds   Hearing loss    Bil hearing aids   Hyperlipidemia    Hypertension    Port-A-Cath in place 12/26/2020   Prediabetes    Sleep apnea     Past Surgical History:  Procedure Laterality Date   BILIARY STENT PLACEMENT N/A 11/09/2020   Procedure: BILIARY STENT PLACEMENT - 62 F X 5 CM STENT;  Surgeon: Rogene Houston, MD;  Location: [facility];  Service: Endoscopy;  Laterality: N/A;   BILIARY STENT PLACEMENT N/A 12/27/2020   Procedure: BILIARY STENT PLACEMENT;  Surgeon: Rogene Houston, MD;  Location: [facility];  Service: Endoscopy;  Laterality: N/A;   COLONOSCOPY     ENTEROSCOPY N/A 12/27/2021   Procedure: ENTEROSCOPY;  Surgeon: Mansouraty, Telford Nab., MD;  Location: [facility] ENDOSCOPY;  Service: Gastroenterology;  Laterality: N/A;   ERCP N/A 11/09/2020   Procedure: ENDOSCOPIC RETROGRADE CHOLANGIOPANCREATOGRAPHY (ERCP);  Surgeon: Rogene Houston, MD;  Location: [facility];  Service: Endoscopy;  Laterality: N/A;   ERCP N/A 12/27/2020   Procedure: ENDOSCOPIC RETROGRADE CHOLANGIOPANCREATOGRAPHY (ERCP);   Surgeon: Rogene Houston, MD;  Location: [facility];  Service: Endoscopy;  Laterality: N/A;   ESOPHAGOGASTRODUODENOSCOPY (EGD) WITH PROPOFOL N/A 11/30/2020   Procedure: ESOPHAGOGASTRODUODENOSCOPY (EGD) WITH PROPOFOL;  Surgeon: Milus Banister, MD;  Location: [facility] END[facility];  Service: Endoscopy;  Laterality: N/A;   EUS N/A 11/30/2020   Procedure: UPPER ENDOSCOPIC ULTRASOUND (EUS) RADIAL;  Surgeon: Milus Banister, MD;  Location: [facility] ENDOSCOPY;  Service: Endoscopy;  Laterality: N/A;   FINE NEEDLE ASPIRATION N/A 11/30/2020   Procedure: FINE NEEDLE ASPIRATION (FNA) LINEAR;  Surgeon: Milus Banister, MD;  Location: [facility] ENDOSCOPY;  Service: Endoscopy;  Laterality: N/A;   GIVENS CAPSULE STUDY     IR IMAGING GUIDED PORT INSERTION  12/18/2020   KNEE SURGERY     4 surgeries on left, 2 on right knee   SHOULDER SURGERY     right shoulder   SPHINCTEROTOMY N/A 11/09/2020   Procedure: SHORT BILIARY SPHINCTEROTOMY;  Surgeon: Rogene Houston, MD;  Location: [facility];  Service: Endoscopy;  Laterality: N/A;   WHIPPLE PROCEDURE      Allergies: Lisinopril  Medications: Prior to Admission medications   Medication Sig Start Date End Date Taking? Authorizing Provider  apixaban (ELIQUIS) 5 MG TABS tablet Take 1 tablet (5 mg total) by mouth 2 (two) times daily. 12/19/21  Yes Luking, Elayne Snare, MD  azelastine (ASTELIN) 0.1 % nasal spray Place 2 sprays into both nostrils 2 (two) times daily. Patient taking differently: Place 2 sprays into both nostrils 2 (two) times daily as needed for allergies. 11/23/21  Yes Luking, Scott A, MD  insulin detemir (LEVEMIR) 100 UNIT/ML FlexPen Inject 8 Units into the skin at bedtime. 07/12/21  Yes Nida, Marella Chimes, MD  insulin lispro (HUMALOG KWIKPEN) 100 UNIT/ML KwikPen Inject 4-7 Units into the skin 3 (three) times daily before meals. 07/12/21  Yes Cassandria Anger, MD  levothyroxine (SYNTHROID) 175 MCG tablet TAKE 1 TABLET DAILY Patient taking differently: Take 175 mcg by mouth  daily before breakfast. 10/17/21  Yes Luking, Elayne Snare, MD  levothyroxine (SYNTHROID) 25 MCG tablet Take one tablet po daily but on Sunday take 1 tablet Patient taking differently: Take 25-50 mcg by mouth as directed. Take 2 tablets (50 mcg) Mon-Sat along with 175 mcg tablet but Take only 1 tablet (25 mcg) on Sunday along with 175 mcg tablet 10/23/21  Yes Luking, Elayne Snare, MD  lipase/protease/amylase (CREON) 36000 UNITS CPEP capsule Take 2 capsules (72,000 Units total) by mouth 3 (three) times daily with meals. May also take 1 capsule (36,000 Units total) as needed (with snacks). Patient taking differently: Take 1 capsule (36,000 Units total) by mouth 3 (three) times daily with meals. May also take 1 capsule (36,000 Units total) as needed (with snacks). 05/31/21  Yes Luking, Elayne Snare, MD  oxyCODONE (OXY IR/ROXICODONE) 5 MG immediate release tablet Take 5 mg by mouth every 6 (six) hours as needed for severe pain.   Yes [provider]  sildenafil (VIAGRA) 100 MG tablet TAKE 1/2 TO 1 TABLET BY MOUTH AS NEEDED FOR ERECTILE DYSFUNCTION Patient taking differently: Take 50-100 mg by mouth as needed for erectile dysfunction. 12/03/21  Yes Luking, Elayne Snare, MD  clobetasol cream (TEMOVATE) 8.88 % Apply 1 application. topically 2 (two) times daily. Patient not taking: Reported on 02/01/2022 09/17/21   Derek Jack, MD  Continuous Blood Gluc Sensor (FREESTYLE LIBRE 3 SENSOR) MISC 1 Piece  by Does not apply route every 14 (fourteen) days. Place 1 sensor on the skin every 14 days. Use to check glucose continuously Dx E11.65 10/22/21   Cassandria Anger, MD  oxyCODONE-acetaminophen (PERCOCET/ROXICET) 5-325 MG tablet Take 1 tablet by mouth every 6 (six) hours as needed for severe pain. Patient not taking: Reported on 02/01/2022 05/02/21   [name], MD     Family History  Problem Relation Age of Onset   Heart attack Father    Heart attack Paternal Grandfather    Leukemia Sister    Heart attack  Brother     Social History   Socioeconomic History   Marital status: Married    Spouse name: Not on file   Number of children: Not on file   Years of education: Not on file   Highest education level: Not on file  Occupational History   Not on file  Tobacco Use   Smoking status: Every Day    Types: Cigars   Smokeless tobacco: Never   Tobacco comments:    Smoke 3-4 cigars a day  Vaping Use   Vaping Use: Never used  Substance and Sexual Activity   Alcohol use: Yes    Comment: occasional   Drug use: Never   Sexual activity: Not on file  Other Topics Concern   Not on file  Social History Narrative   Not on file   Social Determinants of Health   Financial Resource Strain: Low Risk  (12/20/2020)   Overall Financial Resource Strain (CARDIA)    Difficulty of Paying Living Expenses: Not hard at all  Food Insecurity: No Food Insecurity (02/02/2022)   Hunger Vital Sign    Worried About Running Out of Food in the Last Year: Never true    [place] in the Last Year: Never true  Transportation Needs: No Transportation Needs (02/02/2022)   PRAPARE - Hydrologist (Medical): No    Lack of Transportation (Non-Medical): No  Physical Activity: Sufficiently Active (12/20/2020)   Exercise Vital Sign    Days of Exercise per Week: 5 days    Minutes of Exercise per Session: 30 min  Recent Concern: Physical Activity - Inactive (11/17/2020)   Exercise Vital Sign    Days of Exercise per Week: 0 days    Minutes of Exercise per Session: 0 min  Stress: Not on file  Social Connections: Not on file     Review of Systems: A 12 point ROS discussed and pertinent positives are indicated in the HPI above.  All other systems are negative.  Review of Systems  Constitutional:  Negative for chills and fever.  Respiratory:  Negative for cough and shortness of breath.   Cardiovascular:  Negative for chest pain.  Gastrointestinal:  Positive for abdominal pain (none  currently but has intermittent RUQ pain still). Negative for diarrhea, nausea and vomiting.  Skin:  Positive for color change.  Neurological:  Negative for dizziness and headaches.    Vital Signs: BP 135/86 (BP Location: Left Arm)   Pulse 90   Temp 98.2 F (36.8 C) (Oral)   Resp 18   Ht 6' 3"  (1.905 m)   Wt 179 lb 0.2 oz (81.2 kg)   SpO2 98%   BMI 22.38 kg/m   Physical Exam Vitals and nursing note reviewed.  Constitutional:      General: He is not in acute distress.    Appearance: He is ill-appearing.  HENT:  Head: Normocephalic.     Mouth/Throat:     Mouth: Mucous membranes are moist.     Pharynx: Oropharynx is clear. No oropharyngeal exudate or posterior oropharyngeal erythema.  Eyes:     General: Scleral icterus present.  Cardiovascular:     Rate and Rhythm: Normal rate and regular rhythm.  Pulmonary:     Effort: Pulmonary effort is normal.     Breath sounds: Normal breath sounds.  Abdominal:     General: There is no distension.     Palpations: Abdomen is soft.  Skin:    General: Skin is warm and dry.     Coloration: Skin is jaundiced.  Neurological:     Mental Status: He is alert and oriented to person, place, and time.  Psychiatric:        Mood and Affect: Mood normal.        Behavior: Behavior normal.        Thought Content: Thought content normal.        Judgment: Judgment normal.      MD Evaluation Airway: WNL Heart: WNL Abdomen: WNL Chest/ Lungs: WNL ASA  Classification: 3 Mallampati/Airway Score: Two   Imaging: US Abdomen Limited RUQ (LIVER/GB)  Result Date: 02/01/2022 CLINICAL DATA:  Right upper quadrant pain EXAM: ULTRASOUND ABDOMEN LIMITED RIGHT UPPER QUADRANT COMPARISON:  CT 01/29/2022 FINDINGS: Gallbladder: Surgically absent Common bile duct: Diameter: Dilated common bile duct measuring up to 12 mm. There is hypoechoic material or tissue within the bile duct. This is avascular. Liver: No focal hepatic abnormality. Marked intra hepatic  biliary dilatation. The liver is echogenic. Portal vein is patent on color Doppler imaging with normal direction of blood flow towards the liver. Other: None. IMPRESSION: 1. Marked intra and extrahepatic biliary dilatation with hypoechoic debris/material versus hypovascular tissue in the common bile duct. 2. Echogenic liver parenchyma consistent with hepatic steatosis and or hepatocellular disease. Electronically Signed   By: Donavan Foil M.D.   On: 02/01/2022 20:47   CT CHEST W CONTRAST  Result Date: 01/30/2022 CLINICAL DATA:  Recurrent pancreatic cancer with acute increase in liver function studies. Epigastric pain and distension for 3 days. * Tracking Code: BO * EXAM: CT CHEST WITH CONTRAST CT ABDOMEN AND PELVIS WITH AND WITHOUT CONTRAST TECHNIQUE: Multidetector CT imaging of the chest was performed during intravenous contrast administration. Multidetector CT imaging of the abdomen and pelvis was performed following the standard protocol before and during bolus administration of intravenous contrast. RADIATION DOSE REDUCTION: This exam was performed according to the departmental dose-optimization program which includes automated exposure control, adjustment of the mA and/or kV according to patient size and/or use of iterative reconstruction technique. CONTRAST:  167m OMNIPAQUE IOHEXOL 350 MG/ML SOLN COMPARISON:  Prior CT 11/08/2021 and 07/03/2021. FINDINGS: CT CHEST FINDINGS Cardiovascular: No acute vascular findings. Right IJ Port-A-Cath extends to the superior cavoatrial junction. Atherosclerosis of the aorta, great vessels and coronary arteries with stable mild dilatation of the ascending aorta to approximately 4.1 cm. The heart size is normal. There is no pericardial effusion. Mediastinum/Nodes: There are no enlarged mediastinal, hilar or axillary lymph nodes. The thyroid gland, trachea and esophagus demonstrate no significant findings. Lungs/Pleura: No pleural effusion or pneumothorax. Mild  centrilobular emphysema. New patchy ground-glass opacities posteriorly in the left upper lobe (image 33/3), likely inflammatory. New minimal clustered nodularity in the right middle lobe (image 92/3), also likely inflammatory. Stable perifissural nodularity along the minor fissure on image 91/3. No suspicious pulmonary nodularity. Musculoskeletal/Chest wall: No chest wall mass or  suspicious osseous findings. Mild thoracic spondylosis. CT ABDOMEN AND PELVIS FINDINGS Hepatobiliary: New diffuse moderate intrahepatic biliary dilatation with resolution of previously demonstrated pneumobilia, highly suspicious for recurrent biliary obstruction. Status post cholecystectomy and choledocho jejunostomy. No peripheral liver lesions are identified. Pancreas: Status post pancreatic head resection and pancreaticojejunostomy. Enlarging hypodense mass in the pancreatic body, measuring approximately 1.7 x 1.6 cm on image 42/9 (previously 1.1 x 1.0 cm). Increasing main pancreatic ductal dilatation to 8 mm. Enlarging probable hypodense lymph node between the portal vein and superior mesenteric artery, measuring 2.3 x 1.5 cm on image 39/7, likely causing the biliary obstruction. Spleen: Normal in size without focal abnormality. Adrenals/Urinary Tract: Both adrenal glands appear normal. No evidence of urinary tract calculus, suspicious renal lesion or hydronephrosis. Stable tiny renal cysts which do not require any imaging follow-up. The bladder appears unremarkable for its degree of distention. Stomach/Bowel: Enteric contrast was administered and has passed into the distal small bowel. There is a lenticular shaped submucosal fluid collection involving the posterior wall of the proximal stomach, measuring up to 3.8 cm on coronal image 50/13. This appears separate from the pancreas. The stomach otherwise appears unremarkable for its degree of distention. The gastrojejunostomy is patent. No evidence of bowel distension, wall thickening or  surrounding inflammation. Vascular/Lymphatic: As above, enlarging ill-defined portacaval mass causing probable distal biliary obstruction and vascular encasement. There is progressive narrowing of the superior mesenteric vein just inferior to the portal confluence. There is progressive in case mint of the proximal superior mesenteric artery. No enlarged lymph nodes identified in the lower abdomen or pelvis. Diffuse aortic and branch vessel atherosclerosis. Reproductive: Stable mild enlargement of the prostate gland. Other: No ascites or peritoneal nodularity. Musculoskeletal: No acute or significant osseous findings. Mild spondylosis. IMPRESSION: 1. Enlarging hypoenhancing masses at the pancreatico-jejunal anastomosis and portocaval space consistent with progressive local tumor recurrence. There is resulting high-grade biliary obstruction at the choledocho jejunostomy. In addition, there is progressive pancreatic ductal dilatation with a submucosal fluid collection in the posterior wall of the stomach which may reflect a small pseudocyst. 2. The masses in the porta hepatis cause progressive encasement and narrowing of the superior mesenteric artery and vein. 3. No distant metastases or peritoneal nodularity identified. 4. No findings highly suspicious for thoracic metastatic disease. Patchy ground-glass and clustered pulmonary nodularity is likely inflammatory. 5. Patient may benefit from percutaneous biliary decompression. 6. These results will be called to the ordering clinician or representative by the Radiologist Assistant, and communication documented in the PACS or [employer]. Electronically Signed   By: Richardean Sale M.D.   On: 01/30/2022 17:09   CT ABDOMEN PELVIS W WO CONTRAST  Result Date: 01/30/2022 CLINICAL DATA:  Recurrent pancreatic cancer with acute increase in liver function studies. Epigastric pain and distension for 3 days. * Tracking Code: BO * EXAM: CT CHEST WITH CONTRAST CT ABDOMEN  AND PELVIS WITH AND WITHOUT CONTRAST TECHNIQUE: Multidetector CT imaging of the chest was performed during intravenous contrast administration. Multidetector CT imaging of the abdomen and pelvis was performed following the standard protocol before and during bolus administration of intravenous contrast. RADIATION DOSE REDUCTION: This exam was performed according to the departmental dose-optimization program which includes automated exposure control, adjustment of the mA and/or kV according to patient size and/or use of iterative reconstruction technique. CONTRAST:  162m OMNIPAQUE IOHEXOL 350 MG/ML SOLN COMPARISON:  Prior CT 11/08/2021 and 07/03/2021. FINDINGS: CT CHEST FINDINGS Cardiovascular: No acute vascular findings. Right IJ Port-A-Cath extends to the superior cavoatrial junction.  Atherosclerosis of the aorta, great vessels and coronary arteries with stable mild dilatation of the ascending aorta to approximately 4.1 cm. The heart size is normal. There is no pericardial effusion. Mediastinum/Nodes: There are no enlarged mediastinal, hilar or axillary lymph nodes. The thyroid gland, trachea and esophagus demonstrate no significant findings. Lungs/Pleura: No pleural effusion or pneumothorax. Mild centrilobular emphysema. New patchy ground-glass opacities posteriorly in the left upper lobe (image 33/3), likely inflammatory. New minimal clustered nodularity in the right middle lobe (image 92/3), also likely inflammatory. Stable perifissural nodularity along the minor fissure on image 91/3. No suspicious pulmonary nodularity. Musculoskeletal/Chest wall: No chest wall mass or suspicious osseous findings. Mild thoracic spondylosis. CT ABDOMEN AND PELVIS FINDINGS Hepatobiliary: New diffuse moderate intrahepatic biliary dilatation with resolution of previously demonstrated pneumobilia, highly suspicious for recurrent biliary obstruction. Status post cholecystectomy and choledocho jejunostomy. No peripheral liver lesions  are identified. Pancreas: Status post pancreatic head resection and pancreaticojejunostomy. Enlarging hypodense mass in the pancreatic body, measuring approximately 1.7 x 1.6 cm on image 42/9 (previously 1.1 x 1.0 cm). Increasing main pancreatic ductal dilatation to 8 mm. Enlarging probable hypodense lymph node between the portal vein and superior mesenteric artery, measuring 2.3 x 1.5 cm on image 39/7, likely causing the biliary obstruction. Spleen: Normal in size without focal abnormality. Adrenals/Urinary Tract: Both adrenal glands appear normal. No evidence of urinary tract calculus, suspicious renal lesion or hydronephrosis. Stable tiny renal cysts which do not require any imaging follow-up. The bladder appears unremarkable for its degree of distention. Stomach/Bowel: Enteric contrast was administered and has passed into the distal small bowel. There is a lenticular shaped submucosal fluid collection involving the posterior wall of the proximal stomach, measuring up to 3.8 cm on coronal image 50/13. This appears separate from the pancreas. The stomach otherwise appears unremarkable for its degree of distention. The gastrojejunostomy is patent. No evidence of bowel distension, wall thickening or surrounding inflammation. Vascular/Lymphatic: As above, enlarging ill-defined portacaval mass causing probable distal biliary obstruction and vascular encasement. There is progressive narrowing of the superior mesenteric vein just inferior to the portal confluence. There is progressive in case mint of the proximal superior mesenteric artery. No enlarged lymph nodes identified in the lower abdomen or pelvis. Diffuse aortic and branch vessel atherosclerosis. Reproductive: Stable mild enlargement of the prostate gland. Other: No ascites or peritoneal nodularity. Musculoskeletal: No acute or significant osseous findings. Mild spondylosis. IMPRESSION: 1. Enlarging hypoenhancing masses at the pancreatico-jejunal anastomosis  and portocaval space consistent with progressive local tumor recurrence. There is resulting high-grade biliary obstruction at the choledocho jejunostomy. In addition, there is progressive pancreatic ductal dilatation with a submucosal fluid collection in the posterior wall of the stomach which may reflect a small pseudocyst. 2. The masses in the porta hepatis cause progressive encasement and narrowing of the superior mesenteric artery and vein. 3. No distant metastases or peritoneal nodularity identified. 4. No findings highly suspicious for thoracic metastatic disease. Patchy ground-glass and clustered pulmonary nodularity is likely inflammatory. 5. Patient may benefit from percutaneous biliary decompression. 6. These results will be called to the ordering clinician or representative by the Radiologist Assistant, and communication documented in the PACS or [employer]. Electronically Signed   By: Richardean Sale M.D.   On: 01/30/2022 17:09    Labs:  CBC: Recent Labs    01/28/22 1114 02/01/22 2230 02/02/22 2202 02/03/22 0500  WBC 5.4 7.8 9.2 9.9  HGB 12.2* 10.4* 10.3* 10.4*  HCT 36.7* 30.5* 29.3* 30.5*  PLT 363 354 369  387    COAGS: Recent Labs    12/11/21 1005 02/02/22 2202  INR 1.1  --   APTT 33 31    BMP: Recent Labs    12/11/21 1005 12/12/21 0557 01/28/22 1114 02/01/22 2230  NA 136 140 134* 131*  K 3.9 3.9 3.8 3.7  CL 108 114* 103 102  CO2 22 24 24 23   GLUCOSE 224* 77 110* 144*  BUN 17 16 9 19   CALCIUM 7.5* 7.4* 8.0* 7.7*  CREATININE 1.17 0.79 0.72 0.59*  GFRNONAA >60 >60 >60 >60    LIVER FUNCTION TESTS: Recent Labs    11/27/21 1227 12/11/21 1005 01/28/22 1114 02/01/22 2230  BILITOT 0.6 1.0 7.5* 13.1*  AST 20 17 76* 93*  ALT 14 10 49* 62*  ALKPHOS 114 81 343* 372*  PROT 5.4* 5.9* 6.1* 5.9*  ALBUMIN 2.2* 2.2* 2.2* 2.0*    TUMOR MARKERS: No results for input(s): "AFPTM", "CEA", "CA199", "CHROMGRNA" in the last 8760 hours.  Assessment and  Plan:  66 y/o M with recent diagnosis of recurrent pancreatic adenocarcinoma who presented to [facility] ED on 9/15 with complaints of abdominal pain, nausea, vomiting and fevers post pancreatic biopsy about at week ago in [place]. He was found to have obstructive jaundice with biliary dilatation and IR has been consulted PTC and internal/external biliary drain placement for biliary decompression.  Patient history and imaging reviewed by Dr. Kathlene Cote who approves procedure for Monday 9/18.  Plan: - NPO at midnight on 9/18, may have sips with meds - CBC/INR AM of 9/18 - Hold heparin gtt starting at 0600 on 9/18 for procedure. Continue to hold Eliquis until post procedure (last dose 9/15) - Patient currently receiving Zosyn Q8H, no additional abx ordered for procedure prophylaxis at this time - IR will call for patient when ready  Risks and benefits of percutaneous transhepatic cholangiogram with possible internal/external biliary drain placement discussed with the patient including, but not limited to bleeding, infection which may lead to sepsis or even death and damage to adjacent structures.  This interventional procedure involves the use of X-rays and because of the nature of the planned procedure, it is possible that we will have prolonged use of X-ray fluoroscopy.  Potential radiation risks to you include (but are not limited to) the following: - A slightly elevated risk for cancer  several years later in life. This risk is typically less than 0.5% percent. This risk is low in comparison to the normal incidence of human cancer, which is 33% for women and 50% for men according to the [employer]. - Radiation induced injury can include skin redness, resembling a rash, tissue breakdown / ulcers and hair loss (which can be temporary or permanent).   The likelihood of either of these occurring depends on the difficulty of the procedure and whether you are sensitive to radiation due to  previous procedures, disease, or genetic conditions.   IF your procedure requires a prolonged use of radiation, you will be notified and given written instructions for further action.  It is your responsibility to monitor the irradiated area for the 2 weeks following the procedure and to notify your physician if you are concerned that you have suffered a radiation induced injury.    All of the patient's questions were answered, patient is agreeable to proceed.  Consent signed and in chart.  Thank you for this interesting consult.  I greatly enjoyed meeting Alexander Banks and look forward to participating in their care.  A  copy of this report was sent to the requesting provider on this date.  Electronically Signed: Joaquim Nam, PA-C 02/03/2022, 9:52 AM   I spent a total of 40 Minutes in face to face in clinical consultation, greater than 50% of which was counseling/coordinating care for obstructive jaundice.

## 2022-02-03 NOTE — Progress Notes (Signed)
PROGRESS NOTE    Alexander Banks  FAO:[id number] DOB: [birth year] DOA: 02/01/2022 PCP: [name], MD    Brief Narrative:  This 66 years old male with PMH significant for pancreatic cancer following with [name], hypertension, hyperlipidemia, hypothyroidism, type 2 diabetes, CKD stage IIIa, obstructive sleep apnea on CPAP presented to the ED with complaints of right upper quadrant abdominal pain associated with subjective fever, nausea and vomiting.  Patient reports he had pancreatic biopsy for pancreatic cancer in [facility] in [place] last week and since then he has been having subjective fevers, right quadrant pain associated with intermittent nausea and vomiting.  Work-up in the ED revealed obstructive jaundice.  CT A/P shows enlarging hypoenhancing masses at the pancreatic jejunal anastomosis and portocaval space consistent with progressive local tumor recurrence there is resulting high-grade biliary obstruction at the choledocho jejunostomy.  GI and IR is consulted.  Assessment & Plan:   Principal Problem:   Choledocholithiasis Active Problems:   Essential hypertension, benign   Obstructive sleep apnea   Hypothyroidism   Type 2 diabetes mellitus with stage 3a chronic kidney disease, with long-term current use of insulin (HCC)   Mixed hyperlipidemia   Pancreatic adenocarcinoma (HCC)  Obstructive jaundice due to recurrent pancreatic cancer: Patient presented with RUQ pain, nausea, vomiting, subjective fever and yellow discoloration.  Patient recently underwent pancreatic biopsy in [facility] in [place].  No leukocytosis, no lactic acidosis. CT abdomen shows high-grade biliary obstruction at the choledocho jejunostomy.  GI consulted recommended IR consultation for percutaneous drainage. Endoscopic decompression would be difficult given increased pancreatic cancer burden.   Continue IV Zosyn for intra-abdominal infection Continue IV hydration, adequate pain control  with oxycodone. Continue to trend liver enzymes. IR is consulted , Patient is scheduled for biliary placement with cholangiogram tomorrow   Essential hypertension: Blood pressure controlled off medication.   Type 2 diabetes: Hold p.o. diabetic medications Carb modified diet, hemoglobin A1c, Continue regular insulin sliding scale   History of DVT: Hold Eliquis.  Started on heparin gtt. given recent bilateral DVT.   Obstructive sleep apnea: Continue CPAP at night.   Hypothyroidism: Continue levothyroxine   Hyperlipidemia: Hold statins   Hyponatremia: Could be secondary to dehydration.   Continue IV hydration monitor serum sodium   Recent history of bilateral DVT: Continue heparin GTT, hold Eliquis for procedure tomorrow   DVT prophylaxis: Heparin gtt. Code Status: Full code Family Communication: No family at bedside Disposition Plan:    Status is: Inpatient Remains inpatient appropriate because: Admitted for obstructive jaundice found to have obstructing common bile duct.  GI is consulted recommended IR intervention for biliary drain.  Is scheduled for tomorrow    Consultants:  GI IR  Procedures: None Antimicrobials: IV Zosyn  Subjective: Patient was seen and examined at bedside.  Overnight events noted. Patient still appears yellow because of jaundice. He reports pain is reasonably controlled.  He is scheduled for percutaneous biliary drain tomorrow.  Objective: Vitals:   02/02/22 1641 02/02/22 2058 02/03/22 0131 02/03/22 0406  BP: (!) 161/88 127/64 139/88 135/86  Pulse: 85 (!) 109 94 90  Resp: '18 18 18 18  '$ Temp: 99.5 F (37.5 C) 98.7 F (37.1 C) 98.4 F (36.9 C) 98.2 F (36.8 C)  TempSrc: Oral Oral Oral Oral  SpO2: 94% 92% 97% 98%  Weight:      Height:        Intake/Output Summary (Last 24 hours) at 02/03/2022 1031 Last data filed at 02/03/2022 1000 Gross per 24 hour  Intake 1767.71 ml  Output 820 ml  Net 947.71 ml   Filed Weights    02/01/22 1943  Weight: 81.2 kg    Examination:  General exam: Appears comfortable, not in any acute distress, deconditioned.  Jaundiced+ Respiratory system: CTA bilaterally, respiratory effort normal, RR 15. Cardiovascular system: S1 & S2 heard, regular rate and rhythm, no murmur. Gastrointestinal system: Abdomen is soft, RUQ tenderness+ non distended, BS+ Central nervous system: Alert and oriented x 3. No focal neurological deficits. Extremities: No edema, no cyanosis, no clubbing. Skin: No rashes, lesions or ulcers Psychiatry: Judgement and insight appear normal. Mood & affect appropriate.     Data Reviewed: I have personally reviewed following labs and imaging studies  CBC: Recent Labs  Lab 01/28/22 1114 02/01/22 2230 02/02/22 2202 02/03/22 0500  WBC 5.4 7.8 9.2 9.9  NEUTROABS 4.0 5.9  --   --   HGB 12.2* 10.4* 10.3* 10.4*  HCT 36.7* 30.5* 29.3* 30.5*  MCV 92.2 91.6 90.7 91.9  PLT 363 354 369 195   Basic Metabolic Panel: Recent Labs  Lab 01/28/22 1114 02/01/22 2230 02/03/22 0500  NA 134* 131* 130*  K 3.8 3.7 3.5  CL 103 102 102  CO2 '24 23 22  '$ GLUCOSE 110* 144* 105*  BUN '9 19 12  '$ CREATININE 0.72 0.59* 0.55*  CALCIUM 8.0* 7.7* 7.7*  MG 2.0 1.9 1.8  PHOS  --   --  3.1   GFR: Estimated Creatinine Clearance: 107.1 mL/min (A) (by C-G formula based on SCr of 0.55 mg/dL (L)). Liver Function Tests: Recent Labs  Lab 01/28/22 1114 02/01/22 2230 02/03/22 0500  AST 76* 93* 94*  ALT 49* 62* 57*  ALKPHOS 343* 372* 346*  BILITOT 7.5* 13.1* 15.9*  PROT 6.1* 5.9* 5.9*  ALBUMIN 2.2* 2.0* 1.8*   Recent Labs  Lab 02/01/22 2230  LIPASE 16   No results for input(s): "AMMONIA" in the last 168 hours. Coagulation Profile: No results for input(s): "INR", "PROTIME" in the last 168 hours. Cardiac Enzymes: No results for input(s): "CKTOTAL", "CKMB", "CKMBINDEX", "TROPONINI" in the last 168 hours. BNP (last 3 results) No results for input(s): "PROBNP" in the last 8760  hours. HbA1C: No results for input(s): "HGBA1C" in the last 72 hours. CBG: Recent Labs  Lab 02/02/22 1232 02/02/22 1650 02/02/22 2044 02/03/22 0719 02/03/22 0746  GLUCAP 87 126* 123* 56* 82   Lipid Profile: No results for input(s): "CHOL", "HDL", "LDLCALC", "TRIG", "CHOLHDL", "LDLDIRECT" in the last 72 hours. Thyroid Function Tests: No results for input(s): "TSH", "T4TOTAL", "FREET4", "T3FREE", "THYROIDAB" in the last 72 hours. Anemia Panel: No results for input(s): "VITAMINB12", "FOLATE", "FERRITIN", "TIBC", "IRON", "RETICCTPCT" in the last 72 hours. Sepsis Labs: Recent Labs  Lab 02/01/22 2230  LATICACIDVEN 1.1    Recent Results (from the past 240 hour(s))  Urine Culture     Status: None   Collection Time: 02/01/22 12:07 AM   Specimen: Urine, Clean Catch  Result Value Ref Range Status   Specimen Description   Final    URINE, CLEAN CATCH Performed at [facility], [place] [address], [place], [place] [address]    Special Requests   Final    NONE Performed at [facility], [place] [address] [place], [place] [address]    Culture   Final    NO GROWTH Performed at [facility], [place] [address] [place], [place] [address]    Report Status 02/03/2022 FINAL  Final  Blood culture (routine x 2)  Status: None (Preliminary result)   Collection Time: 02/01/22 10:30 PM   Specimen: Porta Cath; Blood  Result Value Ref Range Status   Specimen Description   Final    PORTA CATH BLOOD Performed at [facility], [place] [address], [place], [place] [address]    Special Requests   Final    BOTTLES DRAWN AEROBIC AND ANAEROBIC Blood Culture adequate volume Performed at [place] [address], [place], [place] [address]    Culture   Final    NO GROWTH 1 DAY Performed at [facility], [place] [address], [place], [place] [address]    Report Status PENDING  Incomplete  Blood culture (routine x 2)     Status: None  (Preliminary result)   Collection Time: 02/01/22 10:46 PM   Specimen: BLOOD  Result Value Ref Range Status   Specimen Description   Final    BLOOD RUA Performed at [place] [address], [place], [place] [address]    Special Requests   Final    BOTTLES DRAWN AEROBIC AND ANAEROBIC Blood Culture results may not be optimal due to an excessive volume of blood received in culture bottles Performed at [place] [address] [place], [place] [address]    Culture   Final    NO GROWTH 1 DAY Performed at [facility], [place] [address] [place], [place] [address]    Report Status PENDING  Incomplete     Radiology Studies: US Abdomen Limited RUQ (LIVER/GB)  Result Date: 02/01/2022 CLINICAL DATA:  Right upper quadrant pain EXAM: ULTRASOUND ABDOMEN LIMITED RIGHT UPPER QUADRANT COMPARISON:  CT 01/29/2022 FINDINGS: Gallbladder: Surgically absent Common bile duct: Diameter: Dilated common bile duct measuring up to 12 mm. There is hypoechoic material or tissue within the bile duct. This is avascular. Liver: No focal hepatic abnormality. Marked intra hepatic biliary dilatation. The liver is echogenic. Portal vein is patent on color Doppler imaging with normal direction of blood flow towards the liver. Other: None. IMPRESSION: 1. Marked intra and extrahepatic biliary dilatation with hypoechoic debris/material versus hypovascular tissue in the common bile duct. 2. Echogenic liver parenchyma consistent with hepatic steatosis and or hepatocellular disease. Electronically Signed   By: Donavan Foil M.D.   On: 02/01/2022 20:47    Scheduled Meds:  Chlorhexidine Gluconate Cloth  6 each Topical Daily   docusate sodium  100 mg Oral BID   insulin aspart  0-6 Units Subcutaneous TID WC   insulin aspart  4 Units Subcutaneous TID WC   insulin detemir  8 Units Subcutaneous QHS   levothyroxine  175 mcg Oral QAC breakfast   senna  1 tablet Oral BID   Continuous  Infusions:  sodium chloride 100 mL/hr at 02/03/22 0947   sodium chloride 10 mL/hr at 02/02/22 1805   heparin 1,600 Units/hr (02/03/22 0944)   piperacillin-tazobactam (ZOSYN)  IV 3.375 g (02/03/22 0233)     LOS: 1 day    Time spent: 35 mins    Phoebie Shad, MD [facility]   If 7PM-7AM, please contact night-coverage

## 2022-02-04 ENCOUNTER — Inpatient Hospital Stay (HOSPITAL_COMMUNITY): Payer: BC Managed Care – PPO

## 2022-02-04 DIAGNOSIS — K8050 Calculus of bile duct without cholangitis or cholecystitis without obstruction: Secondary | ICD-10-CM | POA: Diagnosis not present

## 2022-02-04 HISTORY — PX: IR BILIARY DRAIN PLACEMENT WITH CHOLANGIOGRAM: IMG6043

## 2022-02-04 LAB — COMPREHENSIVE METABOLIC PANEL
ALT: 45 U/L — ABNORMAL HIGH (ref 0–44)
AST: 79 U/L — ABNORMAL HIGH (ref 15–41)
Albumin: 1.7 g/dL — ABNORMAL LOW (ref 3.5–5.0)
Alkaline Phosphatase: 316 U/L — ABNORMAL HIGH (ref 38–126)
Anion gap: 6 (ref 5–15)
BUN: 9 mg/dL (ref 8–23)
CO2: 23 mmol/L (ref 22–32)
Calcium: 7.6 mg/dL — ABNORMAL LOW (ref 8.9–10.3)
Chloride: 106 mmol/L (ref 98–111)
Creatinine, Ser: 0.58 mg/dL — ABNORMAL LOW (ref 0.61–1.24)
GFR, Estimated: >60 mL/min (ref >60)
Glucose, Bld: 75 mg/dL (ref 70–99)
Potassium: 3.5 mmol/L (ref 3.5–5.1)
Sodium: 135 mmol/L (ref 135–145)
Total Bilirubin: 16.3 mg/dL — ABNORMAL HIGH (ref 0.3–1.2)
Total Protein: 5.5 g/dL — ABNORMAL LOW (ref 6.5–8.1)

## 2022-02-04 LAB — PROTIME-INR
INR: 1.2 (ref 0.8–1.2)
Prothrombin Time: 15 seconds (ref 11.4–15.2)

## 2022-02-04 LAB — GLUCOSE, CAPILLARY
Glucose-Capillary: 71 mg/dL (ref 70–99)
Glucose-Capillary: 71 mg/dL (ref 70–99)
Glucose-Capillary: 79 mg/dL (ref 70–99)
Glucose-Capillary: 69 mg/dL — ABNORMAL LOW (ref 70–99)
Glucose-Capillary: 150 mg/dL — ABNORMAL HIGH (ref 70–99)

## 2022-02-04 LAB — CBC
HCT: 28.4 % — ABNORMAL LOW (ref 39.0–52.0)
Hemoglobin: 9.6 g/dL — ABNORMAL LOW (ref 13.0–17.0)
MCH: 31 pg (ref 26.0–34.0)
MCHC: 33.8 g/dL (ref 30.0–36.0)
MCV: 91.6 fL (ref 80.0–100.0)
Platelets: 386 10*3/uL (ref 150–400)
RBC: 3.1 MIL/uL — ABNORMAL LOW (ref 4.22–5.81)
RDW: 20.4 % — ABNORMAL HIGH (ref 11.5–15.5)
WBC: 9.3 10*3/uL (ref 4.0–10.5)
nRBC: 0 % (ref 0.0–0.2)

## 2022-02-04 LAB — MAGNESIUM: Magnesium: 1.9 mg/dL (ref 1.7–2.4)

## 2022-02-04 LAB — HEPARIN LEVEL (UNFRACTIONATED): Heparin Unfractionated: 0.12 IU/mL — ABNORMAL LOW (ref 0.30–0.70)

## 2022-02-04 LAB — PHOSPHORUS: Phosphorus: 3.3 mg/dL (ref 2.5–4.6)

## 2022-02-04 MED ORDER — MIDAZOLAM HCL 2 MG/2ML IJ SOLN
INTRAMUSCULAR | Status: AC
  Filled 2022-02-04: qty 4

## 2022-02-04 MED ORDER — HEPARIN (PORCINE) 25000 UT/250ML-% IV SOLN
2200.0000 [IU]/h | INTRAVENOUS | Status: DC
  Administered 2022-02-04: 2200 [IU]/h via INTRAVENOUS
  Filled 2022-02-04: qty 250

## 2022-02-04 MED ORDER — LIDOCAINE HCL 1 % IJ SOLN
INTRAMUSCULAR | Status: AC | PRN
  Administered 2022-02-04: 10 mL via INTRADERMAL

## 2022-02-04 MED ORDER — FENTANYL CITRATE (PF) 100 MCG/2ML IJ SOLN
INTRAMUSCULAR | Status: AC
  Filled 2022-02-04: qty 4

## 2022-02-04 MED ORDER — PIPERACILLIN-TAZOBACTAM 3.375 G IVPB
INTRAVENOUS | Status: AC
  Filled 2022-02-04: qty 50

## 2022-02-04 MED ORDER — HYDROMORPHONE HCL 1 MG/ML IJ SOLN
1.0000 mg | INTRAMUSCULAR | Status: DC | PRN
  Administered 2022-02-04 – 2022-02-05 (×2): 1 mg via INTRAVENOUS
  Filled 2022-02-04 (×4): qty 1

## 2022-02-04 MED ORDER — HYDROMORPHONE HCL 1 MG/ML IJ SOLN
1.0000 mg | Freq: Once | INTRAMUSCULAR | Status: AC
  Administered 2022-02-04: 1 mg via INTRAVENOUS

## 2022-02-04 MED ORDER — SODIUM CHLORIDE 0.9% FLUSH
5.0000 mL | Freq: Three times a day (TID) | INTRAVENOUS | Status: DC
  Administered 2022-02-04 – 2022-02-06 (×6): 5 mL

## 2022-02-04 MED ORDER — MIDAZOLAM HCL 2 MG/2ML IJ SOLN
INTRAMUSCULAR | Status: AC | PRN
  Administered 2022-02-04: 1 mg via INTRAVENOUS

## 2022-02-04 MED ORDER — FENTANYL CITRATE (PF) 100 MCG/2ML IJ SOLN
INTRAMUSCULAR | Status: AC | PRN
  Administered 2022-02-04: 50 ug via INTRAVENOUS

## 2022-02-04 MED ORDER — LIDOCAINE HCL 1 % IJ SOLN
INTRAMUSCULAR | Status: AC
  Filled 2022-02-04: qty 20

## 2022-02-04 MED ORDER — IOHEXOL 300 MG/ML  SOLN
50.0000 mL | Freq: Once | INTRAMUSCULAR | Status: AC | PRN
  Administered 2022-02-04: 40 mL

## 2022-02-04 NOTE — Progress Notes (Signed)
Paged on call provider Hollace Hayward, NP regarding patient's latest blood sugar of 69 and agreed to hold and not to give scheduled Levemir for the patient. We will continue to monitor.

## 2022-02-04 NOTE — Progress Notes (Signed)
Mobility Specialist - Progress Note   02/04/22 1101  Mobility  HOB Elevated/Bed Position Self regulated  Activity Ambulated independently in hallway  Range of Motion/Exercises Active  Level of Assistance Standby assist, set-up cues, supervision of patient - no hands on  Assistive Device None  Distance Ambulated (ft) 600 ft  Activity Response Tolerated well  $Mobility charge 1 Mobility   Pt was found in bed and agreeable to mobilize. Pt was fatigued at the EOS and returned to bed with all necessities in reach.  Ferd Hibbs Mobility Specialist

## 2022-02-04 NOTE — TOC Progression Note (Signed)
Transition of Care [facility]) - Progression Note    Patient Details  Name: Alexander Banks MRN: [id number] Date of Birth: 06-28-1955  Transition of [facility]) CM/SW Contact  Servando Snare, [place] Phone Number: 02/04/2022, 12:48 PM  Clinical Narrative:     Transition of Care [facility]) Screening Note   Patient Details  Name: Alexander Banks Date of Birth: [birth year]   Transition of Care [facility]) CM/SW Contact:    Servando Snare, LCSW Phone Number: 02/04/2022, 12:48 PM    Transition of Care [facility]) has reviewed patient and no TOC needs have been identified at this time. We will continue to monitor patient advancement through interdisciplinary progression rounds. If new patient transition needs arise, please place a TOC consult.          Expected Discharge Plan and Services                                                 Social Determinants of Health (SDOH) Interventions Food Insecurity Interventions: Intervention Not Indicated Housing Interventions: Intervention Not Indicated Transportation Interventions: Intervention Not Indicated Utilities Interventions: Intervention Not Indicated  Readmission Risk Interventions     No data to display

## 2022-02-04 NOTE — Progress Notes (Addendum)
ANTICOAGULATION CONSULT NOTE - Follow Up  Pharmacy Consult for heparin Indication: history of VTE, apixaban on hold  Allergies  Allergen Reactions   Lisinopril Cough    Patient Measurements: Height: '6\' 3"'$  (190.5 cm) Weight: 81.2 kg (179 lb 0.2 oz) IBW/kg (Calculated) : 84.5 Heparin Dosing Weight: total body weight   Vital Signs: Temp: 100.5 F (38.1 C) (09/17 2101) Temp Source: Oral (09/17 2101) BP: 134/80 (09/17 2101) Pulse Rate: 100 (09/17 2101)  Labs: Recent Labs    02/01/22 2230 02/01/22 2230 02/02/22 2202 02/03/22 0500 02/03/22 1956 02/04/22 0327  HGB 10.4*  --  10.3* 10.4*  --  9.6*  HCT 30.5*  --  29.3* 30.5*  --  28.4*  PLT 354  --  369 387  --  386  APTT  --   --  31  --   --   --   LABPROT  --   --   --   --   --  15.0  INR  --   --   --   --   --  1.2  HEPARINUNFRC  --    < > <0.10* <0.10* <0.10* 0.12*  CREATININE 0.59*  --   --  0.55*  --   --    < > = values in this interval not displayed.     Estimated Creatinine Clearance: 107.1 mL/min (A) (by C-G formula based on SCr of 0.55 mg/dL (L)).   Medical History: Past Medical History:  Diagnosis Date   Anemia, iron deficiency    Diabetes mellitus without complication ([place])    on meds   Hearing loss    Bil hearing aids   Hyperlipidemia    Hypertension    Port-A-Cath in place 12/26/2020   Prediabetes    Sleep apnea     Medications: Pt on apixaban 5 mg PO BID prior to admission  -Last dose: 9/15 @ 0715  Assessment: Pt is a 89 yoM presenting with RUQ pain, N/V, and jaundice. PMH significant for pancreatic cancer, CKD, history of DVT (on apixaban). IR consulted  - apixaban to be held for possible invasive procedures. Pharmacy consulted to manage heparin drip while apixaban on hold.   Baseline heparin level undetectable so able to monitor using heparin levels.  Today, 02/04/22 Heparin level = 0.12 units/mL, remains subtherapeutic despite increasing rate of heparin infusion to 1900 units/hr CBC:  Hgb 9.6, low/slightly decreased. Plt WNL and stable.  Confirmed with RN that heparin infusing via peripheral line and infusing at correct rate. No line issues or interruptions. Heparin level obtained by IV team from port-a-cath. Patient re-weighed this morning to confirm dosing on accurate weight. Weight obtained by RN = 78kg.   Goal of Therapy:  Heparin level 0.3-0.7 units/ml Monitor platelets by anticoagulation protocol: Yes   Plan:  Increase heparin infusion to 2200 units/hr Per IR, heparin infusion to be turned off at 0600 today (9/18). Follow up on anticoagulation plan post-procedure.    Lindell Spar, PharmD, BCPS Clinical Pharmacist  02/04/2022,4:16 AM

## 2022-02-04 NOTE — Progress Notes (Signed)
ANTICOAGULATION CONSULT NOTE - Follow Up  Pharmacy Consult for heparin Indication: history of VTE, apixaban on hold  Allergies  Allergen Reactions   Lisinopril Cough    Patient Measurements: Height: '6\' 3"'$  (190.5 cm) Weight: 81.2 kg (179 lb 0.2 oz) IBW/kg (Calculated) : 84.5 Heparin Dosing Weight: total body weight   Vital Signs: Temp: 100.3 F (37.9 C) (09/18 1819) Temp Source: Oral (09/18 1819) BP: 99/52 (09/18 1819) Pulse Rate: 113 (09/18 1819)  Labs: Recent Labs    02/01/22 2230 02/01/22 2230 02/02/22 2202 02/03/22 0500 02/03/22 1956 02/04/22 0327  HGB 10.4*  --  10.3* 10.4*  --  9.6*  HCT 30.5*  --  29.3* 30.5*  --  28.4*  PLT 354  --  369 387  --  386  APTT  --   --  31  --   --   --   LABPROT  --   --   --   --   --  15.0  INR  --   --   --   --   --  1.2  HEPARINUNFRC  --    < > <0.10* <0.10* <0.10* 0.12*  CREATININE 0.59*  --   --  0.55*  --  0.58*   < > = values in this interval not displayed.     Estimated Creatinine Clearance: 107.1 mL/min (A) (by C-G formula based on SCr of 0.58 mg/dL (L)).  Medications: Pt on apixaban 5 mg PO BID prior to admission  -Last dose: 9/15 @ 0715  Assessment: Pt is a 89 yoM presenting with RUQ pain, N/V, and jaundice. PMH significant for pancreatic cancer, CKD, history of DVT in July (on apixaban). IR consulted - apixaban to be held for possible invasive procedures. Pharmacy consulted to manage heparin drip while apixaban on hold.   Baseline heparin level undetectable so able to monitor using heparin levels.  Today, 02/04/22 Heparin held since 0600 today for biliary drain placement in IR Heparin level drawn prior to holding heparin was subtherapeutic, but slightly improved from prior levels CBC: Hgb 9.6, low/slightly decreased. Plt WNL and stable Per TRH, to resume heparin post-procedurally, rather than Eliquis Per IR, procedure bleed risk = STANDARD, corresponding with heparin restart time of 6 hrs post-procedure (per  Pharmacy post-IR protocol) Drain placement completed today at 17:00  Goal of Therapy:  Heparin level 0.3-0.7 units/ml Monitor platelets by anticoagulation protocol: Yes   Plan:  Resume heparin at 11p with rate of 2200 units/hr (no bolus) Check 6-8 hr heparin level Daily CBC & heparin level F/u ability to resume [place], PharmD, BCPS [phone] 02/04/2022, 6:55 PM

## 2022-02-04 NOTE — Progress Notes (Signed)
PROGRESS NOTE    Alexander Banks  LFY:[id number] DOB: 1956/05/03 DOA: 02/01/2022 PCP: [name], MD    Brief Narrative:  This 66 years old male with PMH significant for pancreatic cancer following with [name], hypertension, hyperlipidemia, hypothyroidism, type 2 diabetes, CKD stage IIIa, obstructive sleep apnea on CPAP presented to the ED with complaints of right upper quadrant abdominal pain associated with subjective fever, nausea and vomiting.  Patient reports he had pancreatic biopsy for pancreatic cancer in [facility] in [place] last week and since then he has been having subjective fevers, right quadrant pain associated with intermittent nausea and vomiting.  Work-up in the ED revealed obstructive jaundice.  CT A/P shows enlarging hypoenhancing masses at the pancreatic jejunal anastomosis and portocaval space consistent with progressive local tumor recurrence there is resulting high-grade biliary obstruction at the choledocho jejunostomy.  GI and IR is consulted.  Assessment & Plan:   Principal Problem:   Choledocholithiasis Active Problems:   Essential hypertension, benign   Obstructive sleep apnea   Hypothyroidism   Type 2 diabetes mellitus with stage 3a chronic kidney disease, with long-term current use of insulin (HCC)   Mixed hyperlipidemia   Pancreatic adenocarcinoma (HCC)  Obstructive jaundice due to recurrent pancreatic cancer: Patient presented with RUQ pain, nausea, vomiting, subjective fever and yellow discoloration.   Patient recently underwent pancreatic biopsy in [facility] in [place].  No leukocytosis, no lactic acidosis. CT abdomen shows high-grade biliary obstruction at the choledocho jejunostomy.  GI consulted recommended IR consultation for percutaneous drainage. Endoscopic decompression would be difficult given increased pancreatic cancer burden.   Continue IV Zosyn for intra-abdominal infection Continue IV hydration, adequate pain  control with oxycodone. Continue to trend liver enzymes. IR is consulted , Patient is scheduled for biliary placement with cholangiogram today. Bilirubin trending up 13.6> 16.3   Essential hypertension: Blood pressure controlled off medication.   Type 2 diabetes: Hold p.o. diabetic medications Carb modified diet, hemoglobin A1c, Continue regular insulin sliding scale   History of DVT: Hold Eliquis.  Started on heparin gtt. given recent bilateral DVT. Hold heparin 6 hours prior to procedure.   Obstructive sleep apnea: Continue CPAP at night.   Hypothyroidism: Continue levothyroxine   Hyperlipidemia: Hold statins   Hyponatremia: Could be secondary to dehydration.   Continue IV hydration monitor serum sodium   Recent history of bilateral DVT: Continue heparin GTT, hold Eliquis for procedure    DVT prophylaxis: Heparin gtt. Code Status: Full code Family Communication: No family at bedside Disposition Plan:    Status is: Inpatient Remains inpatient appropriate because: Admitted for obstructive jaundice found to have obstructing common bile duct.  GI is consulted recommended IR intervention for biliary drain.  Scheduled today.    Consultants:  GI IR  Procedures: None Antimicrobials: IV Zosyn  Subjective: Patient was seen and examined at bedside.  Overnight events noted. Patient reports he is feeling better, pain is reasonably controlled.  He spiked fever yesterday 100.5. He is scheduled to have percutaneous biliary drainage by IR today.  Objective: Vitals:   02/03/22 1318 02/03/22 2050 02/03/22 2101 02/04/22 0433  BP: (!) 160/87 130/77 134/80 135/84  Pulse: 93 (!) 103 100 92  Resp: '17 17 18 18  '$ Temp: 98 F (36.7 C) 100.1 F (37.8 C) (!) 100.5 F (38.1 C) 98.4 F (36.9 C)  TempSrc: Oral Oral Oral Oral  SpO2: 97% 97% 97% 97%  Weight:      Height:        Intake/Output  Summary (Last 24 hours) at 02/04/2022 1138 Last data filed at 02/04/2022 0811 Gross  per 24 hour  Intake 2524.11 ml  Output 1620 ml  Net 904.11 ml   Filed Weights   02/01/22 1943  Weight: 81.2 kg    Examination:  General exam: Appears comfortable, not in any acute distress, deconditioned.  Jaundiced+ Respiratory system: CTA bilaterally respiratory effort normal, RR 15 Cardiovascular system: S1 & S2 heard, regular rate and rhythm, no murmur. Gastrointestinal system: Abdomen is soft, RUQ tenderness+, non distended, BS+ Central nervous system: Alert and oriented x 3. No focal neurological deficits. Extremities: No edema, no cyanosis, no clubbing. Skin: No rashes, lesions or ulcers Psychiatry: Judgement and insight appear normal. Mood & affect appropriate.     Data Reviewed: I have personally reviewed following labs and imaging studies  CBC: Recent Labs  Lab 02/01/22 2230 02/02/22 2202 02/03/22 0500 02/04/22 0327  WBC 7.8 9.2 9.9 9.3  NEUTROABS 5.9  --   --   --   HGB 10.4* 10.3* 10.4* 9.6*  HCT 30.5* 29.3* 30.5* 28.4*  MCV 91.6 90.7 91.9 91.6  PLT 354 369 387 956   Basic Metabolic Panel: Recent Labs  Lab 02/01/22 2230 02/03/22 0500 02/04/22 0327  NA 131* 130* 135  K 3.7 3.5 3.5  CL 102 102 106  CO2 '23 22 23  '$ GLUCOSE 144* 105* 75  BUN '19 12 9  '$ CREATININE 0.59* 0.55* 0.58*  CALCIUM 7.7* 7.7* 7.6*  MG 1.9 1.8 1.9  PHOS  --  3.1 3.3   GFR: Estimated Creatinine Clearance: 107.1 mL/min (A) (by C-G formula based on SCr of 0.58 mg/dL (L)). Liver Function Tests: Recent Labs  Lab 02/01/22 2230 02/03/22 0500 02/04/22 0327  AST 93* 94* 79*  ALT 62* 57* 45*  ALKPHOS 372* 346* 316*  BILITOT 13.1* 15.9* 16.3*  PROT 5.9* 5.9* 5.5*  ALBUMIN 2.0* 1.8* 1.7*   Recent Labs  Lab 02/01/22 2230  LIPASE 16   No results for input(s): "AMMONIA" in the last 168 hours. Coagulation Profile: Recent Labs  Lab 02/04/22 0327  INR 1.2   Cardiac Enzymes: No results for input(s): "CKTOTAL", "CKMB", "CKMBINDEX", "TROPONINI" in the last 168 hours. BNP (last  3 results) No results for input(s): "PROBNP" in the last 8760 hours. HbA1C: No results for input(s): "HGBA1C" in the last 72 hours. CBG: Recent Labs  Lab 02/03/22 0746 02/03/22 1157 02/03/22 1615 02/03/22 2104 02/04/22 0804  GLUCAP 82 145* 108* 150* 71   Lipid Profile: No results for input(s): "CHOL", "HDL", "LDLCALC", "TRIG", "CHOLHDL", "LDLDIRECT" in the last 72 hours. Thyroid Function Tests: No results for input(s): "TSH", "T4TOTAL", "FREET4", "T3FREE", "THYROIDAB" in the last 72 hours. Anemia Panel: No results for input(s): "VITAMINB12", "FOLATE", "FERRITIN", "TIBC", "IRON", "RETICCTPCT" in the last 72 hours. Sepsis Labs: Recent Labs  Lab 02/01/22 2230  LATICACIDVEN 1.1    Recent Results (from the past 240 hour(s))  Urine Culture     Status: None   Collection Time: 02/01/22 12:07 AM   Specimen: Urine, Clean Catch  Result Value Ref Range Status   Specimen Description   Final    URINE, CLEAN CATCH Performed at [facility], [place] [address] [place], [place] [address]    Special Requests   Final    NONE Performed at [facility], [place] [employer] [address] [place], [place] [address]    Culture   Final    NO GROWTH Performed at [facility], [place],  [place] [address]    Report Status 02/03/2022 FINAL  Final  Blood culture (routine x 2)     Status: None (Preliminary result)   Collection Time: 02/01/22 10:30 PM   Specimen: Porta Cath; Blood  Result Value Ref Range Status   Specimen Description   Final    PORTA CATH BLOOD Performed at [facility], [place] [address] [place], [place] [address]    Special Requests   Final    BOTTLES DRAWN AEROBIC AND ANAEROBIC Blood Culture adequate volume Performed at [facility] [address], [place], [place] [address]    Culture   Final    NO GROWTH 2 DAYS Performed at [facility] [address] [place], [place] [address]    Report Status  PENDING  Incomplete  Blood culture (routine x 2)     Status: None (Preliminary result)   Collection Time: 02/01/22 10:46 PM   Specimen: BLOOD  Result Value Ref Range Status   Specimen Description   Final    BLOOD RUA Performed at [facility] [address] [place], [place] [address]    Special Requests   Final    BOTTLES DRAWN AEROBIC AND ANAEROBIC Blood Culture results may not be optimal due to an excessive volume of blood received in culture bottles Performed at [facility] [address], [place], [place] [address]    Culture   Final    NO GROWTH 2 DAYS Performed at [facility] [address], [place], [place] [address]    Report Status PENDING  Incomplete     Radiology Studies: No results found.  Scheduled Meds:  Chlorhexidine Gluconate Cloth  6 each Topical Daily   docusate sodium  100 mg Oral BID   insulin aspart  0-6 Units Subcutaneous TID WC   insulin aspart  4 Units Subcutaneous TID WC   insulin detemir  8 Units Subcutaneous QHS   levothyroxine  175 mcg Oral QAC breakfast   senna  1 tablet Oral BID   Continuous Infusions:  sodium chloride 100 mL/hr at 02/03/22 1957   sodium chloride 10 mL/hr at 02/02/22 1805   piperacillin-tazobactam (ZOSYN)  IV 3.375 g (02/04/22 0855)     LOS: 2 days    Time spent: 35 mins    Kyreese Chio, MD [facility]   If 7PM-7AM, please contact night-coverage

## 2022-02-04 NOTE — Progress Notes (Addendum)
Date and time results received: 02/04/22  (use smartphrase ".now" to insert current time)  Test: CBG Critical Value: CBG 69  Name of Provider Notified: Hollace Hayward, NP  Orders Received? Or Actions Taken?: hold Levemir . Patient was also given orange juice and crackers. We will continue to monitor.

## 2022-02-04 NOTE — Procedures (Signed)
Interventional Radiology Procedure Note  Procedure: Right PTC and placement of 57F internal/external biliary drain.   Complications: None  Estimated Blood Loss: None  Recommendations: - Record output - Flush Q 8 hrs - Trend serum Br - When Br is meaningfully trending down, cap tube.  - Follow electrolytes and replete as needed   Signed,  Criselda Peaches, MD

## 2022-02-05 DIAGNOSIS — K8050 Calculus of bile duct without cholangitis or cholecystitis without obstruction: Secondary | ICD-10-CM | POA: Diagnosis not present

## 2022-02-05 LAB — COMPREHENSIVE METABOLIC PANEL
ALT: 59 U/L — ABNORMAL HIGH (ref 0–44)
AST: 83 U/L — ABNORMAL HIGH (ref 15–41)
Albumin: 1.8 g/dL — ABNORMAL LOW (ref 3.5–5.0)
Alkaline Phosphatase: 277 U/L — ABNORMAL HIGH (ref 38–126)
Anion gap: 8 (ref 5–15)
BUN: 13 mg/dL (ref 8–23)
CO2: 22 mmol/L (ref 22–32)
Calcium: 8 mg/dL — ABNORMAL LOW (ref 8.9–10.3)
Chloride: 106 mmol/L (ref 98–111)
Creatinine, Ser: 0.92 mg/dL (ref 0.61–1.24)
GFR, Estimated: >60 mL/min (ref >60)
Glucose, Bld: 162 mg/dL — ABNORMAL HIGH (ref 70–99)
Potassium: 3.9 mmol/L (ref 3.5–5.1)
Sodium: 136 mmol/L (ref 135–145)
Total Bilirubin: 12.4 mg/dL — ABNORMAL HIGH (ref 0.3–1.2)
Total Protein: 5.8 g/dL — ABNORMAL LOW (ref 6.5–8.1)

## 2022-02-05 LAB — CBC
HCT: 29.3 % — ABNORMAL LOW (ref 39.0–52.0)
Hemoglobin: 10.1 g/dL — ABNORMAL LOW (ref 13.0–17.0)
MCH: 31.8 pg (ref 26.0–34.0)
MCHC: 34.5 g/dL (ref 30.0–36.0)
MCV: 92.1 fL (ref 80.0–100.0)
Platelets: 417 10*3/uL — ABNORMAL HIGH (ref 150–400)
RBC: 3.18 MIL/uL — ABNORMAL LOW (ref 4.22–5.81)
RDW: 21.2 % — ABNORMAL HIGH (ref 11.5–15.5)
WBC: 16.6 10*3/uL — ABNORMAL HIGH (ref 4.0–10.5)
nRBC: 0 % (ref 0.0–0.2)

## 2022-02-05 LAB — MAGNESIUM: Magnesium: 2 mg/dL (ref 1.7–2.4)

## 2022-02-05 LAB — GLUCOSE, CAPILLARY
Glucose-Capillary: 163 mg/dL — ABNORMAL HIGH (ref 70–99)
Glucose-Capillary: 185 mg/dL — ABNORMAL HIGH (ref 70–99)
Glucose-Capillary: 166 mg/dL — ABNORMAL HIGH (ref 70–99)
Glucose-Capillary: 125 mg/dL — ABNORMAL HIGH (ref 70–99)
Glucose-Capillary: 126 mg/dL — ABNORMAL HIGH (ref 70–99)

## 2022-02-05 LAB — PHOSPHORUS: Phosphorus: 4.4 mg/dL (ref 2.5–4.6)

## 2022-02-05 LAB — HEPARIN LEVEL (UNFRACTIONATED): Heparin Unfractionated: 0.2 IU/mL — ABNORMAL LOW (ref 0.30–0.70)

## 2022-02-05 MED ORDER — HEPARIN (PORCINE) 25000 UT/250ML-% IV SOLN
2500.0000 [IU]/h | INTRAVENOUS | Status: DC
  Administered 2022-02-05: 2500 [IU]/h via INTRAVENOUS
  Filled 2022-02-05: qty 250

## 2022-02-05 MED ORDER — APIXABAN 2.5 MG PO TABS
5.0000 mg | ORAL_TABLET | Freq: Two times a day (BID) | ORAL | Status: DC
  Administered 2022-02-05 – 2022-02-06 (×3): 5 mg via ORAL
  Filled 2022-02-05 (×3): qty 2

## 2022-02-05 NOTE — Progress Notes (Signed)
ANTICOAGULATION CONSULT NOTE - Follow Up  Pharmacy Consult for heparin Indication: history of VTE, apixaban on hold  Allergies  Allergen Reactions   Lisinopril Cough    Patient Measurements: Height: '6\' 3"'$  (190.5 cm) Weight: 81.2 kg (179 lb 0.2 oz) IBW/kg (Calculated) : 84.5 Heparin Dosing Weight: total body weight   Vital Signs: Temp: 98.2 F (36.8 C) (09/19 0434) Temp Source: Oral (09/19 0434) BP: 138/84 (09/19 0434) Pulse Rate: 85 (09/19 0434)  Labs: Recent Labs    02/02/22 2202 02/03/22 0500 02/03/22 1956 02/04/22 0327 02/05/22 0540  HGB 10.3* 10.4*  --  9.6* 10.1*  HCT 29.3* 30.5*  --  28.4* 29.3*  PLT 369 387  --  386 417*  APTT 31  --   --   --   --   LABPROT  --   --   --  15.0  --   INR  --   --   --  1.2  --   HEPARINUNFRC <0.10* <0.10* <0.10* 0.12* 0.20*  CREATININE  --  0.55*  --  0.58* 0.92     Estimated Creatinine Clearance: 93.2 mL/min (by C-G formula based on SCr of 0.92 mg/dL).  Medications: Pt on apixaban 5 mg PO BID prior to admission  -Last dose: 9/15 @ 0715  Assessment: Pt is a 50 yoM presenting with RUQ pain, N/V, and jaundice. PMH significant for pancreatic cancer, CKD, history of DVT in July (on apixaban). IR consulted - apixaban to be held for possible invasive procedures. Pharmacy consulted to manage heparin drip while apixaban on hold.   Baseline heparin level undetectable so able to monitor using heparin levels.  Today, 02/05/22 Drain placement completed 9/18 at 17:00 After IV heparin restarted at 2300 on 9/18, heparin level this AM is SUBtherapeutic on current IV heparin rate of 2200 units/hr CBC stable Per RN, possibly could have been some blood in stool overnight but 3rd shift RN was unable to see specimen  Goal of Therapy:  Heparin level 0.3-0.7 units/ml Monitor platelets by anticoagulation protocol: Yes   Plan:  Increase IV heparin rate from 2200 to 2500 units/hr Check heparin level 6 hours after rate increased Daily CBC  & heparin level F/u ability to resume Eliquis    Adrian Saran, PharmD, BCPS Secure Chat if ?s or find phone # per room assignments  02/05/2022 7:17 AM

## 2022-02-05 NOTE — Progress Notes (Signed)
Referring Physician(s): Kumar,P  Supervising Physician: Daryll Brod  Patient Status:  [facility] - In-pt  Chief Complaint:  Jaundice, pancreatic cancer, biliary obstruction  Subjective: Pt doing ok today; denies worsening abd pain,N/V,fever/chills   Allergies: Lisinopril  Medications: Prior to Admission medications   Medication Sig Start Date End Date Taking? Authorizing Provider  apixaban (ELIQUIS) 5 MG TABS tablet Take 1 tablet (5 mg total) by mouth 2 (two) times daily. 12/19/21  Yes Luking, Elayne Snare, MD  azelastine (ASTELIN) 0.1 % nasal spray Place 2 sprays into both nostrils 2 (two) times daily. Patient taking differently: Place 2 sprays into both nostrils 2 (two) times daily as needed for allergies. 11/23/21  Yes Luking, Scott A, MD  insulin detemir (LEVEMIR) 100 UNIT/ML FlexPen Inject 8 Units into the skin at bedtime. 07/12/21  Yes Nida, Marella Chimes, MD  insulin lispro (HUMALOG KWIKPEN) 100 UNIT/ML KwikPen Inject 4-7 Units into the skin 3 (three) times daily before meals. 07/12/21  Yes Cassandria Anger, MD  levothyroxine (SYNTHROID) 175 MCG tablet TAKE 1 TABLET DAILY Patient taking differently: Take 175 mcg by mouth daily before breakfast. 10/17/21  Yes Luking, Elayne Snare, MD  levothyroxine (SYNTHROID) 25 MCG tablet Take one tablet po daily but on Sunday take 1 tablet Patient taking differently: Take 25-50 mcg by mouth as directed. Take 2 tablets (50 mcg) Mon-Sat along with 175 mcg tablet but Take only 1 tablet (25 mcg) on Sunday along with 175 mcg tablet 10/23/21  Yes Luking, Elayne Snare, MD  lipase/protease/amylase (CREON) 36000 UNITS CPEP capsule Take 2 capsules (72,000 Units total) by mouth 3 (three) times daily with meals. May also take 1 capsule (36,000 Units total) as needed (with snacks). Patient taking differently: Take 1 capsule (36,000 Units total) by mouth 3 (three) times daily with meals. May also take 1 capsule (36,000 Units total) as needed (with snacks). 05/31/21  Yes  Luking, Elayne Snare, MD  oxyCODONE (OXY IR/ROXICODONE) 5 MG immediate release tablet Take 5 mg by mouth every 6 (six) hours as needed for severe pain.   Yes [provider]  sildenafil (VIAGRA) 100 MG tablet TAKE 1/2 TO 1 TABLET BY MOUTH AS NEEDED FOR ERECTILE DYSFUNCTION Patient taking differently: Take 50-100 mg by mouth as needed for erectile dysfunction. 12/03/21  Yes Luking, Elayne Snare, MD  clobetasol cream (TEMOVATE) 9.38 % Apply 1 application. topically 2 (two) times daily. Patient not taking: Reported on 02/01/2022 09/17/21   Derek Jack, MD  Continuous Blood Gluc Sensor (FREESTYLE LIBRE 3 SENSOR) MISC 1 Piece by Does not apply route every 14 (fourteen) days. Place 1 sensor on the skin every 14 days. Use to check glucose continuously Dx E11.65 10/22/21   Cassandria Anger, MD  oxyCODONE-acetaminophen (PERCOCET/ROXICET) 5-325 MG tablet Take 1 tablet by mouth every 6 (six) hours as needed for severe pain. Patient not taking: Reported on 02/01/2022 05/02/21   [name], MD     Vital Signs: BP 107/71 (BP Location: Left Arm)   Pulse 82   Temp 98.2 F (36.8 C) (Oral)   Resp 18   Ht '6\' 3"'$  (1.905 m)   Wt 179 lb 0.2 oz (81.2 kg)   SpO2 97%   BMI 22.38 kg/m   Physical Exam awake/alert; jaundiced; RUQ/biliary drain intact, insertion site ok, not sig tender, OP 685 cc yesterday, 350 cc today green bile  Imaging: IR BILIARY DRAIN PLACEMENT WITH CHOLANGIOGRAM  Result Date: 02/04/2022 INDICATION: 66 year old male with a history of pancreatic cancer status post  Whipple now with recurrent pancreatic cancer and malignant biliary obstruction of the common bile duct. He presents for placement of a percutaneous biliary drain. EXAM: Percutaneous transhepatic cholangiogram with placement of percutaneous biliary drain MEDICATIONS: Zosyn 3.375 g; The antibiotic was administered within an appropriate time frame prior to the initiation of the procedure. ANESTHESIA/SEDATION: Moderate  (conscious) sedation was employed during this procedure. A total of Versed 1 mg and Fentanyl 50 mcg was administered intravenously by the radiology nurse. Total intra-service moderate Sedation Time: 23 minutes. The patient's level of consciousness and vital signs were monitored continuously by radiology nursing throughout the procedure under my direct supervision. FLUOROSCOPY: Radiation Exposure Index (as provided by the fluoroscopic device): 62 mGy Kerma COMPLICATIONS: None immediate. PROCEDURE: Informed written consent was obtained from the patient after a thorough discussion of the procedural risks, benefits and alternatives. All questions were addressed. Maximal Sterile Barrier Technique was utilized including caps, mask, sterile gowns, sterile gloves, sterile drape, hand hygiene and skin antiseptic. A timeout was performed prior to the initiation of the procedure. The liver was interrogated with ultrasound. There is a massive biliary ductal dilatation. A suitable skin entry site in the right anterior axillary line was selected. Local anesthesia was attained by infiltration with 1% lidocaine. A small dermatotomy was made. Under real-time ultrasound guidance a 21 gauge trocar needle was advanced into a tertiary peripheral biliary radicle. A partial transhepatic cholangiogram was performed confirming placement of the needle within a suitable biliary radicle. A wire was then advanced through the wire into the central biliary system. The transitional dilator was advanced over the wire and into the main bile duct. Additional percutaneous transhepatic cholangiography was performed. Complete occlusion of the common bile duct. A Glidewire was successfully advanced beyond the obstruction and into the small bowel. The glidewire was then exchanged for a superstiff Amplatz wire. The percutaneous transhepatic tract was dilated to 10 [place]. A Cook 10.2 [place] biliary drain was then advanced over the wire and formed with the  locking loop in the duodenum. Images were obtained and stored for the medical record. The drain was connected to gravity bag drainage and secured to the skin with 0 silk suture. IMPRESSION: 1. Successful percutaneous transhepatic cholangiogram with placement of an internal/external biliary drainage catheter. PLAN: 1. Maintain biliary drain to gravity bag in till there has been a meaningful down trend in serum bilirubin. 2. When serum bilirubin is clearly improving, the drain may be capped. 3. Record drain output and follow serum electrolytes. Hydrate and replete electrolytes as needed. Electronically Signed   By: Jacqulynn Cadet M.D.   On: 02/04/2022 17:03   US Abdomen Limited RUQ (LIVER/GB)  Result Date: 02/01/2022 CLINICAL DATA:  Right upper quadrant pain EXAM: ULTRASOUND ABDOMEN LIMITED RIGHT UPPER QUADRANT COMPARISON:  CT 01/29/2022 FINDINGS: Gallbladder: Surgically absent Common bile duct: Diameter: Dilated common bile duct measuring up to 12 mm. There is hypoechoic material or tissue within the bile duct. This is avascular. Liver: No focal hepatic abnormality. Marked intra hepatic biliary dilatation. The liver is echogenic. Portal vein is patent on color Doppler imaging with normal direction of blood flow towards the liver. Other: None. IMPRESSION: 1. Marked intra and extrahepatic biliary dilatation with hypoechoic debris/material versus hypovascular tissue in the common bile duct. 2. Echogenic liver parenchyma consistent with hepatic steatosis and or hepatocellular disease. Electronically Signed   By: Donavan Foil M.D.   On: 02/01/2022 20:47    Labs:  CBC: Recent Labs    02/02/22 2202 02/03/22 0500 02/04/22  0327 02/05/22 0540  WBC 9.2 9.9 9.3 16.6*  HGB 10.3* 10.4* 9.6* 10.1*  HCT 29.3* 30.5* 28.4* 29.3*  PLT 369 387 386 417*    COAGS: Recent Labs    12/11/21 1005 02/02/22 2202 02/04/22 0327  INR 1.1  --  1.2  APTT 33 31  --     BMP: Recent Labs    02/01/22 2230  02/03/22 0500 02/04/22 0327 02/05/22 0540  NA 131* 130* 135 136  K 3.7 3.5 3.5 3.9  CL 102 102 106 106  CO2 '23 22 23 22  '$ GLUCOSE 144* 105* 75 162*  BUN '19 12 9 13  '$ CALCIUM 7.7* 7.7* 7.6* 8.0*  CREATININE 0.59* 0.55* 0.58* 0.92  GFRNONAA >60 >60 >60 >60    LIVER FUNCTION TESTS: Recent Labs    02/01/22 2230 02/03/22 0500 02/04/22 0327 02/05/22 0540  BILITOT 13.1* 15.9* 16.3* 12.4*  AST 93* 94* 79* 83*  ALT 62* 57* 45* 59*  ALKPHOS 372* 346* 316* 277*  PROT 5.9* 5.9* 5.5* 5.8*  ALBUMIN 2.0* 1.8* 1.7* 1.8*    Assessment and Plan: Pt with hx pancreatic cancer and prior Whipple; now with recurrence and biliary obstruction; s/p I/E biliary drain on 9/18 (10 fr to bag); afebrile; WBC 16.6(9.3), hgb 10.1(9.6), creat nl, t bili 12.4(16.3), blood cx neg to date, urine cx neg; as per Dr. Katrinka Blazing note:   1. Maintain biliary drain to gravity bag until there has been a meaningful down trend in serum bilirubin. 2. When serum bilirubin is clearly improving, the drain may be capped. 3. Record drain output and follow serum electrolytes. Hydrate and replete electrolytes as needed.  Cont with drain flushes tid as IP, once daily as OP; other plans as per TRH/GI/oncology  Electronically Signed: D. Rowe Robert, PA-C 02/05/2022, 2:46 PM   I spent a total of 15 Minutes at the the patient's bedside AND on the patient's hospital floor or unit, greater than 50% of which was counseling/coordinating care for biliary drain    Patient ID: Alexander Banks, male   DOB: 03-Mar-1956, 66 y.o.   MRN: [id number]

## 2022-02-05 NOTE — Progress Notes (Signed)
PROGRESS NOTE    Alexander Banks  KVQ:[id number] DOB: 1956-02-23 DOA: 02/01/2022 PCP: [name], MD   Brief Narrative:  This 66 years old male with PMH significant for pancreatic cancer following with [name], hypertension, hyperlipidemia, hypothyroidism, type 2 diabetes, CKD stage IIIa, obstructive sleep apnea on CPAP presented to the ED with complaints of right upper quadrant abdominal pain associated with subjective fever, nausea and vomiting.  Patient reports he had pancreatic biopsy for pancreatic cancer in [facility] in [place] last week and since then he has been having subjective fevers, right quadrant pain associated with intermittent nausea and vomiting.  Work-up in the ED revealed obstructive jaundice.  CT A/P shows enlarging hypoenhancing masses at the pancreatic jejunal anastomosis and portocaval space consistent with progressive local tumor recurrence there is resulting high-grade biliary obstruction at the choledocho jejunostomy.  GI and IR is consulted.  Patient underwent percutaneous biliary drain.  LFTs improving.  Assessment & Plan:   Principal Problem:   Choledocholithiasis Active Problems:   Essential hypertension, benign   Obstructive sleep apnea   Hypothyroidism   Type 2 diabetes mellitus with stage 3a chronic kidney disease, with long-term current use of insulin (HCC)   Mixed hyperlipidemia   Pancreatic adenocarcinoma (HCC)  Obstructive jaundice due to recurrent pancreatic cancer: Patient presented with RUQ pain, nausea, vomiting, subjective fever and yellow discoloration.   Patient recently underwent pancreatic biopsy in [facility] in [place].  No leukocytosis, no lactic acidosis. CT abdomen shows high-grade biliary obstruction at the choledocho jejunostomy.  GI consulted recommended IR consultation for percutaneous drainage. Endoscopic decompression would be difficult given increased pancreatic cancer burden.   Continue IV Zosyn for  intra-abdominal infection Continue IV hydration, adequate pain control with oxycodone. Continue to trend liver enzymes.  LFTs improving IR is consulted , Patient underwent biliary placement with cholangiogram 9/18. Follow bilirubin trend 16.3 > 12.4 Anticipated discharge home on 02/06/2022.   Essential hypertension: Blood pressure controlled off medication.   Type 2 diabetes: Hold p.o. diabetic medications Carb modified diet, check hemoglobin A1c, Continue regular insulin sliding scale   History of DVT: Held Eliquis.  Started on heparin gtt. given recent bilateral DVT. Resume Eliquis today.   Obstructive sleep apnea: Continue CPAP at night.   Hypothyroidism: Continue levothyroxine.   Hyperlipidemia: Hold statins   Hyponatremia: Could be secondary to dehydration.   Resolved with IV hydration.   Recent history of bilateral DVT: Resume Eliquis.   DVT prophylaxis: Eliquis Code Status: Full code Family Communication: No family at bedside. Disposition Plan:    Status is: Inpatient Remains inpatient appropriate because: Admitted for obstructive jaundice found to have obstructing common bile duct.  GI is consulted recommended IR intervention for biliary drain.  Underwent biliary drain.  Anticipated discharge home 9/20.    Consultants:  GI IR  Procedures: None Antimicrobials: IV Zosyn  Subjective: Patient was seen and examined at bedside.  Overnight events noted. Patient reports feeling better, yellow discoloration is improving.   Pain is reasonably controlled.  He wants to be discharged. He underwent percutaneous biliary drain.  Tolerated well   Objective: Vitals:   02/04/22 1819 02/04/22 2007 02/05/22 0010 02/05/22 0434  BP: (!) 99/52 118/74 126/81 138/84  Pulse: (!) 113 93 88 85  Resp: '20 18 12 14  '$ Temp: 100.3 F (37.9 C) 98.5 F (36.9 C) 98.4 F (36.9 C) 98.2 F (36.8 C)  TempSrc: Oral Oral Oral Oral  SpO2: 97% 98% 97% 98%  Weight:  Height:         Intake/Output Summary (Last 24 hours) at 02/05/2022 1148 Last data filed at 02/05/2022 0600 Gross per 24 hour  Intake 2055.34 ml  Output 860 ml  Net 1195.34 ml   Filed Weights   02/01/22 1943  Weight: 81.2 kg    Examination:  General exam: Appears comfortable, not in any acute distress, deconditioned. Respiratory system: CTA bilaterally, respiratory effort normal, RR 15. Cardiovascular system: S1 & S2 heard, regular rate and rhythm, no murmur. Gastrointestinal system: Abdomen is soft, mild RUQ tenderness, non distended, BS+ Central nervous system: Alert and oriented x 3. No focal neurological deficits. Extremities: No edema, no cyanosis, no clubbing. Skin: No rashes, lesions or ulcers Psychiatry: Judgement and insight appear normal. Mood & affect appropriate.     Data Reviewed: I have personally reviewed following labs and imaging studies  CBC: Recent Labs  Lab 02/01/22 2230 02/02/22 2202 02/03/22 0500 02/04/22 0327 02/05/22 0540  WBC 7.8 9.2 9.9 9.3 16.6*  NEUTROABS 5.9  --   --   --   --   HGB 10.4* 10.3* 10.4* 9.6* 10.1*  HCT 30.5* 29.3* 30.5* 28.4* 29.3*  MCV 91.6 90.7 91.9 91.6 92.1  PLT 354 369 387 386 220*   Basic Metabolic Panel: Recent Labs  Lab 02/01/22 2230 02/03/22 0500 02/04/22 0327 02/05/22 0540  NA 131* 130* 135 136  K 3.7 3.5 3.5 3.9  CL 102 102 106 106  CO2 '23 22 23 22  '$ GLUCOSE 144* 105* 75 162*  BUN '19 12 9 13  '$ CREATININE 0.59* 0.55* 0.58* 0.92  CALCIUM 7.7* 7.7* 7.6* 8.0*  MG 1.9 1.8 1.9 2.0  PHOS  --  3.1 3.3 4.4   GFR: Estimated Creatinine Clearance: 93.2 mL/min (by C-G formula based on SCr of 0.92 mg/dL). Liver Function Tests: Recent Labs  Lab 02/01/22 2230 02/03/22 0500 02/04/22 0327 02/05/22 0540  AST 93* 94* 79* 83*  ALT 62* 57* 45* 59*  ALKPHOS 372* 346* 316* 277*  BILITOT 13.1* 15.9* 16.3* 12.4*  PROT 5.9* 5.9* 5.5* 5.8*  ALBUMIN 2.0* 1.8* 1.7* 1.8*   Recent Labs  Lab 02/01/22 2230  LIPASE 16   No results  for input(s): "AMMONIA" in the last 168 hours. Coagulation Profile: Recent Labs  Lab 02/04/22 0327  INR 1.2   Cardiac Enzymes: No results for input(s): "CKTOTAL", "CKMB", "CKMBINDEX", "TROPONINI" in the last 168 hours. BNP (last 3 results) No results for input(s): "PROBNP" in the last 8760 hours. HbA1C: No results for input(s): "HGBA1C" in the last 72 hours. CBG: Recent Labs  Lab 02/04/22 1144 02/04/22 1648 02/04/22 2135 02/04/22 2249 02/05/22 0718  GLUCAP 71 79 69* 150* 163*   Lipid Profile: No results for input(s): "CHOL", "HDL", "LDLCALC", "TRIG", "CHOLHDL", "LDLDIRECT" in the last 72 hours. Thyroid Function Tests: No results for input(s): "TSH", "T4TOTAL", "FREET4", "T3FREE", "THYROIDAB" in the last 72 hours. Anemia Panel: No results for input(s): "VITAMINB12", "FOLATE", "FERRITIN", "TIBC", "IRON", "RETICCTPCT" in the last 72 hours. Sepsis Labs: Recent Labs  Lab 02/01/22 2230  LATICACIDVEN 1.1    Recent Results (from the past 240 hour(s))  Urine Culture     Status: None   Collection Time: 02/01/22 12:07 AM   Specimen: Urine, Clean Catch  Result Value Ref Range Status   Specimen Description   Final    URINE, CLEAN CATCH Performed at [facility], [place] [address] [place], [place] [address]    Special Requests   Final    NONE  Performed at [facility], [place] [address] [place], [place] [address]    Culture   Final    NO GROWTH Performed at [facility], [place] [address] [place], [place] [address]    Report Status 02/03/2022 FINAL  Final  Blood culture (routine x 2)     Status: None (Preliminary result)   Collection Time: 02/01/22 10:30 PM   Specimen: Porta Cath; Blood  Result Value Ref Range Status   Specimen Description   Final    PORTA CATH BLOOD Performed at [facility], [place] [address] [place], [place] [address]    Special Requests   Final    BOTTLES DRAWN AEROBIC AND ANAEROBIC Blood Culture adequate  volume Performed at [place] [address], [place], [place] [address]    Culture   Final    NO GROWTH 3 DAYS Performed at [facility], [place] [address], [place], [place] [address]    Report Status PENDING  Incomplete  Blood culture (routine x 2)     Status: None (Preliminary result)   Collection Time: 02/01/22 10:46 PM   Specimen: BLOOD  Result Value Ref Range Status   Specimen Description   Final    BLOOD RUA Performed at [place] [address] [place], [place] [address]    Special Requests   Final    BOTTLES DRAWN AEROBIC AND ANAEROBIC Blood Culture results may not be optimal due to an excessive volume of blood received in culture bottles Performed at [place] [address], [place], [place] [address]    Culture   Final    NO GROWTH 3 DAYS Performed at [facility], [place] [address], [place], [place] [address]    Report Status PENDING  Incomplete     Radiology Studies: IR BILIARY DRAIN PLACEMENT WITH CHOLANGIOGRAM  Result Date: 02/04/2022 INDICATION: 66 year old male with a history of pancreatic cancer status post Whipple now with recurrent pancreatic cancer and malignant biliary obstruction of the common bile duct. He presents for placement of a percutaneous biliary drain. EXAM: Percutaneous transhepatic cholangiogram with placement of percutaneous biliary drain MEDICATIONS: Zosyn 3.375 g; The antibiotic was administered within an appropriate time frame prior to the initiation of the procedure. ANESTHESIA/SEDATION: Moderate (conscious) sedation was employed during this procedure. A total of Versed 1 mg and Fentanyl 50 mcg was administered intravenously by the radiology nurse. Total intra-service moderate Sedation Time: 23 minutes. The patient's level of consciousness and vital signs were monitored continuously by radiology nursing throughout the procedure under my direct supervision. FLUOROSCOPY:  Radiation Exposure Index (as provided by the fluoroscopic device): 62 mGy Kerma COMPLICATIONS: None immediate. PROCEDURE: Informed written consent was obtained from the patient after a thorough discussion of the procedural risks, benefits and alternatives. All questions were addressed. Maximal Sterile Barrier Technique was utilized including caps, mask, sterile gowns, sterile gloves, sterile drape, hand hygiene and skin antiseptic. A timeout was performed prior to the initiation of the procedure. The liver was interrogated with ultrasound. There is a massive biliary ductal dilatation. A suitable skin entry site in the right anterior axillary line was selected. Local anesthesia was attained by infiltration with 1% lidocaine. A small dermatotomy was made. Under real-time ultrasound guidance a 21 gauge trocar needle was advanced into a tertiary peripheral biliary radicle. A partial transhepatic cholangiogram was performed confirming placement of the needle within a suitable biliary radicle. A wire was then advanced through the  wire into the central biliary system. The transitional dilator was advanced over the wire and into the main bile duct. Additional percutaneous transhepatic cholangiography was performed. Complete occlusion of the common bile duct. A Glidewire was successfully advanced beyond the obstruction and into the small bowel. The glidewire was then exchanged for a superstiff Amplatz wire. The percutaneous transhepatic tract was dilated to 10 [place]. A Cook 10.2 [place] biliary drain was then advanced over the wire and formed with the locking loop in the duodenum. Images were obtained and stored for the medical record. The drain was connected to gravity bag drainage and secured to the skin with 0 silk suture. IMPRESSION: 1. Successful percutaneous transhepatic cholangiogram with placement of an internal/external biliary drainage catheter. PLAN: 1. Maintain biliary drain to gravity bag in till there has been  a meaningful down trend in serum bilirubin. 2. When serum bilirubin is clearly improving, the drain may be capped. 3. Record drain output and follow serum electrolytes. Hydrate and replete electrolytes as needed. Electronically Signed   By: Jacqulynn Cadet M.D.   On: 02/04/2022 17:03    Scheduled Meds:  Chlorhexidine Gluconate Cloth  6 each Topical Daily   docusate sodium  100 mg Oral BID   insulin aspart  0-6 Units Subcutaneous TID WC   insulin aspart  4 Units Subcutaneous TID WC   insulin detemir  8 Units Subcutaneous QHS   levothyroxine  175 mcg Oral QAC breakfast   senna  1 tablet Oral BID   sodium chloride flush  5 mL Intracatheter Q8H   Continuous Infusions:  sodium chloride 100 mL/hr at 02/04/22 2322   sodium chloride 10 mL/hr at 02/02/22 1805   heparin 2,500 Units/hr (02/05/22 1032)   piperacillin-tazobactam (ZOSYN)  IV 3.375 g (02/05/22 0948)     LOS: 3 days    Time spent: 35 mins    Ulisses Vondrak, MD [facility]   If 7PM-7AM, please contact night-coverage

## 2022-02-05 NOTE — Progress Notes (Signed)
Mobility Specialist - Progress Note   02/05/22 1117  Mobility  HOB Elevated/Bed Position Self regulated  Activity Ambulated independently in hallway  Range of Motion/Exercises Active  Level of Assistance Independent  Assistive Device None  Distance Ambulated (ft) 600 ft  Activity Response Tolerated well  $Mobility charge 1 Mobility   Pt was found in bed and agreeable to mobilize. Stated having some abdominal pain when coughing. At EOS returned to bed with all necessities in reach.  Ferd Hibbs Mobility Specialist

## 2022-02-06 ENCOUNTER — Other Ambulatory Visit (HOSPITAL_COMMUNITY): Payer: Self-pay

## 2022-02-06 ENCOUNTER — Other Ambulatory Visit: Payer: Self-pay | Admitting: Student

## 2022-02-06 DIAGNOSIS — K8050 Calculus of bile duct without cholangitis or cholecystitis without obstruction: Secondary | ICD-10-CM | POA: Diagnosis not present

## 2022-02-06 LAB — GLUCOSE, CAPILLARY
Glucose-Capillary: 74 mg/dL (ref 70–99)
Glucose-Capillary: 148 mg/dL — ABNORMAL HIGH (ref 70–99)

## 2022-02-06 LAB — CBC
HCT: 26.2 % — ABNORMAL LOW (ref 39.0–52.0)
Hemoglobin: 9 g/dL — ABNORMAL LOW (ref 13.0–17.0)
MCH: 31.9 pg (ref 26.0–34.0)
MCHC: 34.4 g/dL (ref 30.0–36.0)
MCV: 92.9 fL (ref 80.0–100.0)
Platelets: 372 10*3/uL (ref 150–400)
RBC: 2.82 MIL/uL — ABNORMAL LOW (ref 4.22–5.81)
RDW: 20.6 % — ABNORMAL HIGH (ref 11.5–15.5)
WBC: 8 10*3/uL (ref 4.0–10.5)
nRBC: 0 % (ref 0.0–0.2)

## 2022-02-06 LAB — HEPATIC FUNCTION PANEL
ALT: 60 U/L — ABNORMAL HIGH (ref 0–44)
AST: 85 U/L — ABNORMAL HIGH (ref 15–41)
Albumin: 1.7 g/dL — ABNORMAL LOW (ref 3.5–5.0)
Alkaline Phosphatase: 260 U/L — ABNORMAL HIGH (ref 38–126)
Bilirubin, Direct: 6.9 mg/dL — ABNORMAL HIGH (ref 0.0–0.2)
Indirect Bilirubin: 4.8 mg/dL — ABNORMAL HIGH (ref 0.3–0.9)
Total Bilirubin: 11.7 mg/dL — ABNORMAL HIGH (ref 0.3–1.2)
Total Protein: 5.8 g/dL — ABNORMAL LOW (ref 6.5–8.1)

## 2022-02-06 LAB — COMPREHENSIVE METABOLIC PANEL
ALT: 40 U/L (ref 0–44)
AST: 43 U/L — ABNORMAL HIGH (ref 15–41)
Albumin: 1.6 g/dL — ABNORMAL LOW (ref 3.5–5.0)
Alkaline Phosphatase: 192 U/L — ABNORMAL HIGH (ref 38–126)
Anion gap: 5 (ref 5–15)
BUN: 12 mg/dL (ref 8–23)
CO2: 22 mmol/L (ref 22–32)
Calcium: 7.5 mg/dL — ABNORMAL LOW (ref 8.9–10.3)
Chloride: 108 mmol/L (ref 98–111)
Creatinine, Ser: 0.79 mg/dL (ref 0.61–1.24)
GFR, Estimated: >60 mL/min (ref >60)
Glucose, Bld: 155 mg/dL — ABNORMAL HIGH (ref 70–99)
Potassium: 3.2 mmol/L — ABNORMAL LOW (ref 3.5–5.1)
Sodium: 135 mmol/L (ref 135–145)
Total Bilirubin: 7.5 mg/dL — ABNORMAL HIGH (ref 0.3–1.2)
Total Protein: 5.3 g/dL — ABNORMAL LOW (ref 6.5–8.1)

## 2022-02-06 MED ORDER — AMOXICILLIN-POT CLAVULANATE 875-125 MG PO TABS
1.0000 | ORAL_TABLET | Freq: Two times a day (BID) | ORAL | Status: DC
  Administered 2022-02-06: 1 via ORAL
  Filled 2022-02-06: qty 1

## 2022-02-06 MED ORDER — SODIUM CHLORIDE 0.9% FLUSH
5.0000 mL | Freq: Every day | INTRAVENOUS | 0 refills | Status: AC
  Filled 2022-02-06: qty 300, 30d supply, fill #0

## 2022-02-06 MED ORDER — HEPARIN SOD (PORK) LOCK FLUSH 100 UNIT/ML IV SOLN
500.0000 [IU] | INTRAVENOUS | Status: AC | PRN
  Administered 2022-02-06: 500 [IU]
  Filled 2022-02-06: qty 5

## 2022-02-06 MED ORDER — AMOXICILLIN-POT CLAVULANATE 875-125 MG PO TABS
1.0000 | ORAL_TABLET | Freq: Two times a day (BID) | ORAL | 0 refills | Status: DC
  Filled 2022-02-06: qty 20, 10d supply, fill #0

## 2022-02-06 NOTE — Progress Notes (Signed)
Referring Physician(s): Kumar,P  Supervising Physician: Arne Cleveland  Patient Status:  [facility] - In-pt  Chief Complaint:  Jaundice, pancreatic cancer, biliary obstruction S/p int ext bili drain placement on 9/18 by Dr. Laurence Ferrari   Subjective:  Patient sitting in a chair, NAD. Spouse st bedside.  States that he is doing ok, no abd pain n/v. Wants to go home.  Spouse states that they are waiting on CMP result and may be discharged home today.    Allergies: Lisinopril  Medications: Prior to Admission medications   Medication Sig Start Date End Date Taking? Authorizing Provider  apixaban (ELIQUIS) 5 MG TABS tablet Take 1 tablet (5 mg total) by mouth 2 (two) times daily. 12/19/21  Yes Luking, Elayne Snare, MD  azelastine (ASTELIN) 0.1 % nasal spray Place 2 sprays into both nostrils 2 (two) times daily. Patient taking differently: Place 2 sprays into both nostrils 2 (two) times daily as needed for allergies. 11/23/21  Yes Luking, Scott A, MD  insulin detemir (LEVEMIR) 100 UNIT/ML FlexPen Inject 8 Units into the skin at bedtime. 07/12/21  Yes Nida, Marella Chimes, MD  insulin lispro (HUMALOG KWIKPEN) 100 UNIT/ML KwikPen Inject 4-7 Units into the skin 3 (three) times daily before meals. 07/12/21  Yes Cassandria Anger, MD  levothyroxine (SYNTHROID) 175 MCG tablet TAKE 1 TABLET DAILY Patient taking differently: Take 175 mcg by mouth daily before breakfast. 10/17/21  Yes Luking, Elayne Snare, MD  levothyroxine (SYNTHROID) 25 MCG tablet Take one tablet po daily but on Sunday take 1 tablet Patient taking differently: Take 25-50 mcg by mouth as directed. Take 2 tablets (50 mcg) Mon-Sat along with 175 mcg tablet but Take only 1 tablet (25 mcg) on Sunday along with 175 mcg tablet 10/23/21  Yes Luking, Elayne Snare, MD  lipase/protease/amylase (CREON) 36000 UNITS CPEP capsule Take 2 capsules (72,000 Units total) by mouth 3 (three) times daily with meals. May also take 1 capsule (36,000 Units total) as needed  (with snacks). Patient taking differently: Take 1 capsule (36,000 Units total) by mouth 3 (three) times daily with meals. May also take 1 capsule (36,000 Units total) as needed (with snacks). 05/31/21  Yes Luking, Elayne Snare, MD  oxyCODONE (OXY IR/ROXICODONE) 5 MG immediate release tablet Take 5 mg by mouth every 6 (six) hours as needed for severe pain.   Yes [provider]  sildenafil (VIAGRA) 100 MG tablet TAKE 1/2 TO 1 TABLET BY MOUTH AS NEEDED FOR ERECTILE DYSFUNCTION Patient taking differently: Take 50-100 mg by mouth as needed for erectile dysfunction. 12/03/21  Yes Luking, Elayne Snare, MD  clobetasol cream (TEMOVATE) 9.48 % Apply 1 application. topically 2 (two) times daily. Patient not taking: Reported on 02/01/2022 09/17/21   Derek Jack, MD  Continuous Blood Gluc Sensor (FREESTYLE LIBRE 3 SENSOR) MISC 1 Piece by Does not apply route every 14 (fourteen) days. Place 1 sensor on the skin every 14 days. Use to check glucose continuously Dx E11.65 10/22/21   Cassandria Anger, MD  oxyCODONE-acetaminophen (PERCOCET/ROXICET) 5-325 MG tablet Take 1 tablet by mouth every 6 (six) hours as needed for severe pain. Patient not taking: Reported on 02/01/2022 05/02/21   [name], MD     Vital Signs: BP 137/88 (BP Location: Left Arm)   Pulse 93   Temp 97.9 F (36.6 C) (Oral)   Resp 17   Ht '6\' 3"'$  (1.905 m)   Wt 179 lb 0.2 oz (81.2 kg)   SpO2 97%   BMI 22.38 kg/m  Physical Exam Vitals reviewed.  Constitutional:      General: He is not in acute distress.    Appearance: Normal appearance.  HENT:     Head: Normocephalic.  Pulmonary:     Effort: Pulmonary effort is normal.  Abdominal:     General: Abdomen is flat.     Palpations: Abdomen is soft.  Skin:    General: Skin is warm and dry.     Coloration: Skin is jaundiced.     Comments: Positive RUQ drain to a gravity bag. Site is unremarkable with no erythema, edema, tenderness, bleeding or drainage. Suture and stat lock in  place. Dressing is clean, dry, and intact. 100 ml of  bile colored fluid noted in the bag. Drain flushes well.    Neurological:     Mental Status: He is alert and oriented to person, place, and time.  Psychiatric:        Mood and Affect: Mood normal.        Behavior: Behavior normal.    awake/alert; jaundiced; RUQ/biliary drain intact, insertion site ok, not sig tender, OP 685 cc yesterday, 350 cc today green bile  Imaging: IR BILIARY DRAIN PLACEMENT WITH CHOLANGIOGRAM  Result Date: 02/04/2022 INDICATION: 66 year old male with a history of pancreatic cancer status post Whipple now with recurrent pancreatic cancer and malignant biliary obstruction of the common bile duct. He presents for placement of a percutaneous biliary drain. EXAM: Percutaneous transhepatic cholangiogram with placement of percutaneous biliary drain MEDICATIONS: Zosyn 3.375 g; The antibiotic was administered within an appropriate time frame prior to the initiation of the procedure. ANESTHESIA/SEDATION: Moderate (conscious) sedation was employed during this procedure. A total of Versed 1 mg and Fentanyl 50 mcg was administered intravenously by the radiology nurse. Total intra-service moderate Sedation Time: 23 minutes. The patient's level of consciousness and vital signs were monitored continuously by radiology nursing throughout the procedure under my direct supervision. FLUOROSCOPY: Radiation Exposure Index (as provided by the fluoroscopic device): 62 mGy Kerma COMPLICATIONS: None immediate. PROCEDURE: Informed written consent was obtained from the patient after a thorough discussion of the procedural risks, benefits and alternatives. All questions were addressed. Maximal Sterile Barrier Technique was utilized including caps, mask, sterile gowns, sterile gloves, sterile drape, hand hygiene and skin antiseptic. A timeout was performed prior to the initiation of the procedure. The liver was interrogated with ultrasound. There is a  massive biliary ductal dilatation. A suitable skin entry site in the right anterior axillary line was selected. Local anesthesia was attained by infiltration with 1% lidocaine. A small dermatotomy was made. Under real-time ultrasound guidance a 21 gauge trocar needle was advanced into a tertiary peripheral biliary radicle. A partial transhepatic cholangiogram was performed confirming placement of the needle within a suitable biliary radicle. A wire was then advanced through the wire into the central biliary system. The transitional dilator was advanced over the wire and into the main bile duct. Additional percutaneous transhepatic cholangiography was performed. Complete occlusion of the common bile duct. A Glidewire was successfully advanced beyond the obstruction and into the small bowel. The glidewire was then exchanged for a superstiff Amplatz wire. The percutaneous transhepatic tract was dilated to 10 [place]. A Cook 10.2 [place] biliary drain was then advanced over the wire and formed with the locking loop in the duodenum. Images were obtained and stored for the medical record. The drain was connected to gravity bag drainage and secured to the skin with 0 silk suture. IMPRESSION: 1. Successful percutaneous transhepatic cholangiogram with placement  of an internal/external biliary drainage catheter. PLAN: 1. Maintain biliary drain to gravity bag in till there has been a meaningful down trend in serum bilirubin. 2. When serum bilirubin is clearly improving, the drain may be capped. 3. Record drain output and follow serum electrolytes. Hydrate and replete electrolytes as needed. Electronically Signed   By: Jacqulynn Cadet M.D.   On: 02/04/2022 17:03    Labs:  CBC: Recent Labs    02/03/22 0500 02/04/22 0327 02/05/22 0540 02/06/22 0425  WBC 9.9 9.3 16.6* 8.0  HGB 10.4* 9.6* 10.1* 9.0*  HCT 30.5* 28.4* 29.3* 26.2*  PLT 387 386 417* 372     COAGS: Recent Labs    12/11/21 1005 02/02/22 2202  02/04/22 0327  INR 1.1  --  1.2  APTT 33 31  --      BMP: Recent Labs    02/01/22 2230 02/03/22 0500 02/04/22 0327 02/05/22 0540  NA 131* 130* 135 136  K 3.7 3.5 3.5 3.9  CL 102 102 106 106  CO2 '23 22 23 22  '$ GLUCOSE 144* 105* 75 162*  BUN '19 12 9 13  '$ CALCIUM 7.7* 7.7* 7.6* 8.0*  CREATININE 0.59* 0.55* 0.58* 0.92  GFRNONAA >60 >60 >60 >60     LIVER FUNCTION TESTS: Recent Labs    02/03/22 0500 02/04/22 0327 02/05/22 0540 02/05/22 1400  BILITOT 15.9* 16.3* 12.4* 11.7*  AST 94* 79* 83* 85*  ALT 57* 45* 59* 60*  ALKPHOS 346* 316* 277* 260*  PROT 5.9* 5.5* 5.8* 5.8*  ALBUMIN 1.8* 1.7* 1.8* 1.7*     Assessment and Plan: Pt with hx pancreatic cancer and prior Whipple; now with recurrence and biliary obstruction; s/p I/E biliary drain on 9/18 (10 fr to bag);   WBC normal, afebrile  CMP pending, T bili dropped 16.3 to 12.4 post procedure so far  OP 770 mL overnight  Cx gram stain (-), cx pending   Drain Location: RUQ Size: Fr size: 10 Fr Date of placement: 9/18  Currently to: Drain collection device: gravity 24 hour output:  Output by Drain (mL) 02/04/22 0701 - 02/04/22 1900 02/04/22 1901 - 02/05/22 0700 02/05/22 0701 - 02/05/22 1900 02/05/22 1901 - 02/06/22 0700 02/06/22 0701 - 02/06/22 1125  Biliary Tube Cook slip-coat 10 Fr. RUQ 150 535 420 350 50    Interval imaging/drain manipulation:  None   Current examination: Flushes easily.  Insertion site unremarkable. Suture and stat lock in place. Dressed appropriately.   Plan: Continue TID flushes with 5 cc NS. Record output Q shift. Dressing changes QD or PRN if soiled.  Call IR APP or on call IR MD if difficulty flushing or sudden change in drain output.  Repeat imaging/possible drain injection once output < 10 mL/QD (excluding flush material). Consideration for drain removal if output is < 10 mL/QD (excluding flush material), pending discussion with the providing surgical service.  Discharge  planning: Outpatient f/u ordered in, flush prescription sent to [facility].  Will schedule the patient for routine exchange 6-8 weeks.  Will attempt capping trial when T bili wnl.   IR will continue to follow - please call with questions or concerns.   Electronically Signed: Tera Mater, PA-C 02/06/2022, 11:23 AM   I spent a total of 15 Minutes at the the patient's bedside AND on the patient's hospital floor or unit, greater than 50% of which was counseling/coordinating care for biliary drain

## 2022-02-06 NOTE — Discharge Summary (Signed)
Physician Discharge Summary  BERNELL SIGAL PJA:[id number] DOB: May 06, 1956 DOA: 02/01/2022  PCP: [name], MD  Admit date: 02/01/2022 Discharge date: 02/06/2022  Admitted From: home Discharge disposition: home   Recommendations for Outpatient Follow-Up:   Outpatient drain clinic: Continue TID flushes with 5 cc NS. Record output Q shift. Dressing changes QD or PRN if soiled.  Call IR APP or on call IR MD if difficulty flushing or sudden change in drain output.  Repeat imaging/possible drain injection once output < 10 mL/QD (excluding flush material). Consideration for drain removal if output is < 10 mL/QD (excluding flush material), pending discussion with the providing surgical service. Follow bilirubin  Outpatient GI followup   Discharge Diagnosis:   Principal Problem:   Choledocholithiasis Active Problems:   Essential hypertension, benign   Obstructive sleep apnea   Hypothyroidism   Type 2 diabetes mellitus with stage 3a chronic kidney disease, with long-term current use of insulin (HCC)   Mixed hyperlipidemia   Pancreatic adenocarcinoma ([place])    Discharge Condition: Improved.  Diet recommendation:  Regular.  Wound care: None.  Code status: Full.   History of Present Illness:   Alexander Banks is a 66 y.o. male with PMH significant for pancreatic cancer, following with [name], essential hypertension, hyperlipidemia, hypothyroidism, type 2 diabetes, CKD stage IIIa, obstructive sleep apnea on CPAP presented in the ED with complaints of right upper quadrant abdominal pain associated with subjective fever, nausea and vomiting.  Patient reports he had pancreatic biopsy for pancreatic cancer in [facility] in [place] last week and since then he has been having subjective fevers, right upper quadrant abdominal pain associated with intermittent nausea and vomiting.  Patient also reported yellow discoloration of body.  He describes pain as sharp  radiating towards the back on both sides, denies any sick contacts, recent travel.  His oncologist asked suggested him to come to the ED for further evaluation.     Hospital Course by Problem:    Obstructive jaundice due to recurrent pancreatic cancer: Patient presented with RUQ pain, nausea, vomiting, subjective fever and yellow discoloration.   Patient recently underwent pancreatic biopsy in [facility] in [place].  No leukocytosis, no lactic acidosis. CT abdomen shows high-grade biliary obstruction at the choledocho jejunostomy.  GI consulted recommended IR consultation for percutaneous drainage. Endoscopic decompression would be difficult given increased pancreatic cancer burden.   IR is consulted , Patient underwent biliary placement with cholangiogram 9/18. Bilirubin trending down   Essential hypertension: Blood pressure controlled off medication.   Type 2 diabetes: -resume home meds   History of DVT: -eliquis   Obstructive sleep apnea: Continue CPAP at night.   Hypothyroidism: Continue levothyroxine.   Hyponatremia: Resolved with IV hydration.         Discharge Exam:   Vitals:   02/06/22 0456 02/06/22 1320  BP: 137/88 138/81  Pulse: 93 77  Resp: 17 18  Temp: 97.9 F (36.6 C)   SpO2: 97% 96%   Vitals:   02/05/22 1214 02/05/22 2017 02/06/22 0456 02/06/22 1320  BP: 107/71 131/75 137/88 138/81  Pulse: 82 87 93 77  Resp: '18 18 17 18  '$ Temp: 98.2 F (36.8 C) 99.6 F (37.6 C) 97.9 F (36.6 C)   TempSrc: Oral Oral Oral   SpO2: 97% 94% 97% 96%  Weight:      Height:        General exam: Appears calm and comfortable.   The results of significant diagnostics from  this hospitalization (including imaging, microbiology, ancillary and laboratory) are listed below for reference.     Procedures and Diagnostic Studies:   US Abdomen Limited RUQ (LIVER/GB)  Result Date: 02/01/2022 CLINICAL DATA:  Right upper quadrant pain EXAM: ULTRASOUND ABDOMEN LIMITED  RIGHT UPPER QUADRANT COMPARISON:  CT 01/29/2022 FINDINGS: Gallbladder: Surgically absent Common bile duct: Diameter: Dilated common bile duct measuring up to 12 mm. There is hypoechoic material or tissue within the bile duct. This is avascular. Liver: No focal hepatic abnormality. Marked intra hepatic biliary dilatation. The liver is echogenic. Portal vein is patent on color Doppler imaging with normal direction of blood flow towards the liver. Other: None. IMPRESSION: 1. Marked intra and extrahepatic biliary dilatation with hypoechoic debris/material versus hypovascular tissue in the common bile duct. 2. Echogenic liver parenchyma consistent with hepatic steatosis and or hepatocellular disease. Electronically Signed   By: Donavan Foil M.D.   On: 02/01/2022 20:47     Labs:   Basic Metabolic Panel: Recent Labs  Lab 02/01/22 2230 02/03/22 0500 02/04/22 0327 02/05/22 0540  NA 131* 130* 135 136  K 3.7 3.5 3.5 3.9  CL 102 102 106 106  CO2 '23 22 23 22  '$ GLUCOSE 144* 105* 75 162*  BUN '19 12 9 13  '$ CREATININE 0.59* 0.55* 0.58* 0.92  CALCIUM 7.7* 7.7* 7.6* 8.0*  MG 1.9 1.8 1.9 2.0  PHOS  --  3.1 3.3 4.4   GFR Estimated Creatinine Clearance: 93.2 mL/min (by C-G formula based on SCr of 0.92 mg/dL). Liver Function Tests: Recent Labs  Lab 02/01/22 2230 02/03/22 0500 02/04/22 0327 02/05/22 0540 02/05/22 1400  AST 93* 94* 79* 83* 85*  ALT 62* 57* 45* 59* 60*  ALKPHOS 372* 346* 316* 277* 260*  BILITOT 13.1* 15.9* 16.3* 12.4* 11.7*  PROT 5.9* 5.9* 5.5* 5.8* 5.8*  ALBUMIN 2.0* 1.8* 1.7* 1.8* 1.7*   Recent Labs  Lab 02/01/22 2230  LIPASE 16   No results for input(s): "AMMONIA" in the last 168 hours. Coagulation profile Recent Labs  Lab 02/04/22 0327  INR 1.2    CBC: Recent Labs  Lab 02/01/22 2230 02/02/22 2202 02/03/22 0500 02/04/22 0327 02/05/22 0540 02/06/22 0425  WBC 7.8 9.2 9.9 9.3 16.6* 8.0  NEUTROABS 5.9  --   --   --   --   --   HGB 10.4* 10.3* 10.4* 9.6* 10.1*  9.0*  HCT 30.5* 29.3* 30.5* 28.4* 29.3* 26.2*  MCV 91.6 90.7 91.9 91.6 92.1 92.9  PLT 354 369 387 386 417* 372   Cardiac Enzymes: No results for input(s): "CKTOTAL", "CKMB", "CKMBINDEX", "TROPONINI" in the last 168 hours. BNP: Invalid input(s): "POCBNP" CBG: Recent Labs  Lab 02/05/22 1525 02/05/22 1756 02/05/22 2015 02/06/22 0722 02/06/22 1143  GLUCAP 166* 125* 126* 74 148*   D-Dimer No results for input(s): "DDIMER" in the last 72 hours. Hgb A1c No results for input(s): "HGBA1C" in the last 72 hours. Lipid Profile No results for input(s): "CHOL", "HDL", "LDLCALC", "TRIG", "CHOLHDL", "LDLDIRECT" in the last 72 hours. Thyroid function studies No results for input(s): "TSH", "T4TOTAL", "T3FREE", "THYROIDAB" in the last 72 hours.  Invalid input(s): "FREET3" Anemia work up No results for input(s): "VITAMINB12", "FOLATE", "FERRITIN", "TIBC", "IRON", "RETICCTPCT" in the last 72 hours. Microbiology Recent Results (from the past 240 hour(s))  Urine Culture     Status: None   Collection Time: 02/01/22 12:07 AM   Specimen: Urine, Clean Catch  Result Value Ref Range Status   Specimen Description   Final  URINE, CLEAN CATCH Performed at [facility], [place] [address], [place], [place] [address]    Special Requests   Final    NONE Performed at [facility], [place] [address], [place], [place] [address]    Culture   Final    NO GROWTH Performed at [facility], [place] [address] [place], [place] [address]    Report Status 02/03/2022 FINAL  Final  Blood culture (routine x 2)     Status: None (Preliminary result)   Collection Time: 02/01/22 10:30 PM   Specimen: Porta Cath; Blood  Result Value Ref Range Status   Specimen Description   Final    PORTA CATH BLOOD Performed at [facility], [place] [address], [place], [place] [address]    Special Requests   Final    BOTTLES DRAWN AEROBIC AND ANAEROBIC Blood Culture adequate  volume Performed at [facility] [place] [address] [place], [place] [address]    Culture   Final    NO GROWTH 4 DAYS Performed at [facility], [place] [address] [place], [place] [address]    Report Status PENDING  Incomplete  Blood culture (routine x 2)     Status: None (Preliminary result)   Collection Time: 02/01/22 10:46 PM   Specimen: BLOOD  Result Value Ref Range Status   Specimen Description   Final    BLOOD RUA Performed at [facility] [address] [place], [place] [address]    Special Requests   Final    BOTTLES DRAWN AEROBIC AND ANAEROBIC Blood Culture results may not be optimal due to an excessive volume of blood received in culture bottles Performed at [facility] [address], [place], [place] [address]    Culture   Final    NO GROWTH 4 DAYS Performed at [facility], [place] [address], [place], [place] [address]    Report Status PENDING  Incomplete  Body fluid culture w Gram Stain     Status: None (Preliminary result)   Collection Time: 02/05/22  2:55 PM   Specimen: BILE; Body Fluid  Result Value Ref Range Status   Specimen Description   Final    BILE Performed at [facility] [address] [place], [place] [address]    Special Requests   Final    Normal Performed at [facility], [place] [address] [place], [place] [address]    Gram Stain NO WBC SEEN NO ORGANISMS SEEN   Final   Culture   Final    RARE KLEBSIELLA PNEUMONIAE SUSCEPTIBILITIES TO FOLLOW Performed at [facility], [place] [address] [place], [place] [address]    Report Status PENDING  Incomplete     Discharge Instructions:   Discharge Instructions     Diet general   Complete by: As directed    Discharge wound care:   Complete by: As directed    See drain care   Increase activity slowly   Complete by: As directed       Allergies as of 02/06/2022       Reactions    Lisinopril Cough        Medication List     STOP taking these medications    clobetasol cream 0.05 % Commonly known as: TEMOVATE   oxyCODONE-acetaminophen 5-325 MG tablet Commonly known as: PERCOCET/ROXICET       TAKE these medications    amoxicillin-clavulanate 875-125  MG tablet Commonly known as: AUGMENTIN Take 1 tablet by mouth every 12 (twelve) hours.   apixaban 5 MG Tabs tablet Commonly known as: ELIQUIS Take 1 tablet (5 mg total) by mouth 2 (two) times daily.   azelastine 0.1 % nasal spray Commonly known as: ASTELIN Place 2 sprays into both nostrils 2 (two) times daily. What changed:  when to take this reasons to take this   FreeStyle Libre 3 Sensor Misc 1 Piece by Does not apply route every 14 (fourteen) days. Place 1 sensor on the skin every 14 days. Use to check glucose continuously Dx E11.65   insulin detemir 100 UNIT/ML FlexPen Commonly known as: LEVEMIR Inject 8 Units into the skin at bedtime.   insulin lispro 100 UNIT/ML KwikPen Commonly known as: HumaLOG KwikPen Inject 4-7 Units into the skin 3 (three) times daily before meals.   levothyroxine 175 MCG tablet Commonly known as: SYNTHROID TAKE 1 TABLET DAILY What changed: when to take this   levothyroxine 25 MCG tablet Commonly known as: SYNTHROID Take one tablet po daily but on Sunday take 1 tablet What changed:  how much to take how to take this when to take this additional instructions   lipase/protease/amylase 36000 UNITS Cpep capsule Commonly known as: Creon Take 2 capsules (72,000 Units total) by mouth 3 (three) times daily with meals. May also take 1 capsule (36,000 Units total) as needed (with snacks). What changed: See the new instructions.   oxyCODONE 5 MG immediate release tablet Commonly known as: Oxy IR/ROXICODONE Take 5 mg by mouth every 6 (six) hours as needed for severe pain.   sildenafil 100 MG tablet Commonly known as: VIAGRA TAKE 1/2 TO 1 TABLET BY MOUTH AS NEEDED  FOR ERECTILE DYSFUNCTION What changed: See the new instructions.   sodium chloride flush 0.9 % Soln Commonly known as: NS 5-10 mLs by Intracatheter route daily. Flush biliary drain with 5 mL normal saline everyday.               Discharge Care Instructions  (From admission, onward)           Start     Ordered   02/06/22 0000  Discharge wound care:       Comments: See drain care   02/06/22 1337            Follow-up Information     Criselda Peaches, MD Follow up.   Specialties: Interventional Radiology, Radiology Why: Routine biliary drain exchnage in 6-8 weeks at [facility] will call you to set up the appointment. Contact information: [place] [address] [place] [address] [phone]                  Time coordinating discharge: 45 min  Signed:  Geradine Girt DO  [facility] 02/06/2022, 2:59 PM

## 2022-02-06 NOTE — Discharge Instructions (Signed)
Outpatient f/u ordered in, flush prescription sent to [name].  Will schedule the patient for routine exchange 6-8 weeks.  Will attempt capping trial when T bili wnl.

## 2022-02-06 NOTE — Plan of Care (Signed)
Patient is stable for discharge, patient was given discharge instructions. Patient understood instructions and all questions were answered. Patient was taken downstairs by care team member to go home with wife.

## 2022-02-06 NOTE — TOC Transition Note (Signed)
Transition of Care [facility]) - CM/SW Discharge Note   Patient Details  Name: Alexander Banks MRN: [id number] Date of Birth: December 19, 1955  [facility] CM[facility]SW Contact:  Leeroy Cha, RN Phone Number: 02/06/2022, 1:46 PM   Clinical Narrative:     [id number] discharged to return home.  Chart reviewed for TOC needs.  None found.  Patient self care.  Final next level of care: Home/Self Care Barriers to Discharge: Barriers Resolved   Patient Goals and CMS Choice        Discharge Placement                       Discharge Plan and Services                                     Social Determinants of Health (SDOH) Interventions Food Insecurity Interventions: Intervention Not Indicated Housing Interventions: Intervention Not Indicated Transportation Interventions: Intervention Not Indicated Utilities Interventions: Intervention Not Indicated   Readmission Risk Interventions   No data to display

## 2022-02-07 ENCOUNTER — Telehealth: Payer: Self-pay | Admitting: Family Medicine

## 2022-02-07 ENCOUNTER — Other Ambulatory Visit: Payer: Self-pay

## 2022-02-07 DIAGNOSIS — T859XXA Unspecified complication of internal prosthetic device, implant and graft, initial encounter: Secondary | ICD-10-CM

## 2022-02-07 LAB — BODY FLUID CULTURE W GRAM STAIN
Gram Stain: NONE SEEN
Special Requests: NORMAL

## 2022-02-07 LAB — CULTURE, BLOOD (ROUTINE X 2)
Culture: NO GROWTH
Culture: NO GROWTH
Special Requests: ADEQUATE

## 2022-02-07 NOTE — Telephone Encounter (Signed)
Pt contacted and verbalized understanding. Pt set up for Wednesday at 3:30 p with Dr.Scott. lab orders placed

## 2022-02-07 NOTE — Telephone Encounter (Signed)
Nurses Patient recently in the hospital for blockage of pancreatic duct due to growth.  Had interventional radiology put a stent externally. He will need follow-up late next week with myself in a open slot.(Obviously if family does not want a follow-up you will need to let me know) I would also recommend the patient do a CBC and CMP with estimated GFR 1 day before the office visit so we will have results to work with

## 2022-02-11 ENCOUNTER — Encounter: Payer: Self-pay | Admitting: *Deleted

## 2022-02-11 NOTE — Progress Notes (Signed)
Oncology Discharge Planning Note  [place] at [facility] Address: [address], [place] [address] Hours of Operation:  8am - 5pm, Monday - Friday  Clinic Contact Information:  [phone]  Oncology Care Team: Medical Oncologist:  Derek Jack  Patient Details: Name:  Alexander Banks, Alexander Banks MRN:   [id number] DOB:   10-21-1955 Reason for Current Admission: '@PPROB'$ @  Discharge Planning Narrative: Discharge follow-up appointments for oncology are current and available on the AVS and MyChart.   Upon discharge from the hospital, hematology/oncology's post discharge plan of care for the outpatient setting is: 02/19/22 as previously planned.    Outpatient Oncology Specific Care Only: Oncology appointment transportation needs addressed?:  not applicable Oncology medication management for symptom management addressed?:  not applicable Chemo Alert Card reviewed?:  not applicable Immunotherapy Alert Card reviewed?:  not applicable

## 2022-02-12 ENCOUNTER — Other Ambulatory Visit: Payer: Self-pay

## 2022-02-12 DIAGNOSIS — C259 Malignant neoplasm of pancreas, unspecified: Secondary | ICD-10-CM

## 2022-02-12 DIAGNOSIS — T859XXA Unspecified complication of internal prosthetic device, implant and graft, initial encounter: Secondary | ICD-10-CM | POA: Diagnosis not present

## 2022-02-12 NOTE — Progress Notes (Signed)
Order placed for PET scan per [name]. Schedule pending.

## 2022-02-13 ENCOUNTER — Ambulatory Visit (INDEPENDENT_AMBULATORY_CARE_PROVIDER_SITE_OTHER): Payer: BC Managed Care – PPO | Admitting: "Endocrinology

## 2022-02-13 ENCOUNTER — Encounter: Payer: Self-pay | Admitting: Hematology

## 2022-02-13 ENCOUNTER — Ambulatory Visit (INDEPENDENT_AMBULATORY_CARE_PROVIDER_SITE_OTHER): Payer: BC Managed Care – PPO | Admitting: Family Medicine

## 2022-02-13 ENCOUNTER — Encounter: Payer: Self-pay | Admitting: "Endocrinology

## 2022-02-13 ENCOUNTER — Encounter: Payer: Self-pay | Admitting: Family Medicine

## 2022-02-13 VITALS — BP 128/66 | Wt 162.2 lb

## 2022-02-13 VITALS — BP 82/56 | HR 80 | Ht 75.0 in | Wt 163.0 lb

## 2022-02-13 DIAGNOSIS — C259 Malignant neoplasm of pancreas, unspecified: Secondary | ICD-10-CM

## 2022-02-13 DIAGNOSIS — E782 Mixed hyperlipidemia: Secondary | ICD-10-CM

## 2022-02-13 DIAGNOSIS — C251 Malignant neoplasm of body of pancreas: Secondary | ICD-10-CM | POA: Diagnosis not present

## 2022-02-13 DIAGNOSIS — K8681 Exocrine pancreatic insufficiency: Secondary | ICD-10-CM | POA: Diagnosis not present

## 2022-02-13 DIAGNOSIS — K8689 Other specified diseases of pancreas: Secondary | ICD-10-CM | POA: Diagnosis not present

## 2022-02-13 DIAGNOSIS — E089 Diabetes mellitus due to underlying condition without complications: Secondary | ICD-10-CM

## 2022-02-13 DIAGNOSIS — E039 Hypothyroidism, unspecified: Secondary | ICD-10-CM | POA: Diagnosis not present

## 2022-02-13 DIAGNOSIS — R109 Unspecified abdominal pain: Secondary | ICD-10-CM

## 2022-02-13 LAB — CBC WITH DIFFERENTIAL/PLATELET
Basophils Absolute: 0 10*3/uL (ref 0.0–0.2)
Basos: 0 %
EOS (ABSOLUTE): 0.1 10*3/uL (ref 0.0–0.4)
Eos: 1 %
Hematocrit: 33 % — ABNORMAL LOW (ref 37.5–51.0)
Hemoglobin: 11.3 g/dL — ABNORMAL LOW (ref 13.0–17.7)
Immature Grans (Abs): 0.1 10*3/uL (ref 0.0–0.1)
Immature Granulocytes: 2 %
Lymphocytes Absolute: 1 10*3/uL (ref 0.7–3.1)
Lymphs: 15 %
MCH: 32.4 pg (ref 26.6–33.0)
MCHC: 34.2 g/dL (ref 31.5–35.7)
MCV: 95 fL (ref 79–97)
Monocytes Absolute: 0.5 10*3/uL (ref 0.1–0.9)
Monocytes: 7 %
Neutrophils Absolute: 5.1 10*3/uL (ref 1.4–7.0)
Neutrophils: 75 %
Platelets: 544 10*3/uL — ABNORMAL HIGH (ref 150–450)
RBC: 3.49 x10E6/uL — ABNORMAL LOW (ref 4.14–5.80)
RDW: 17.2 % — ABNORMAL HIGH (ref 11.6–15.4)
WBC: 6.7 10*3/uL (ref 3.4–10.8)

## 2022-02-13 LAB — CMP14+EGFR
ALT: 54 IU/L — ABNORMAL HIGH (ref 0–44)
AST: 62 IU/L — ABNORMAL HIGH (ref 0–40)
Albumin/Globulin Ratio: 0.8 — ABNORMAL LOW (ref 1.2–2.2)
Albumin: 2.9 g/dL — ABNORMAL LOW (ref 3.9–4.9)
Alkaline Phosphatase: 299 IU/L — ABNORMAL HIGH (ref 44–121)
BUN/Creatinine Ratio: 13 (ref 10–24)
BUN: 12 mg/dL (ref 8–27)
Bilirubin Total: 5.9 mg/dL — ABNORMAL HIGH (ref 0.0–1.2)
CO2: 22 mmol/L (ref 20–29)
Calcium: 8.1 mg/dL — ABNORMAL LOW (ref 8.6–10.2)
Chloride: 101 mmol/L (ref 96–106)
Creatinine, Ser: 0.92 mg/dL (ref 0.76–1.27)
Globulin, Total: 3.5 g/dL (ref 1.5–4.5)
Glucose: 126 mg/dL — ABNORMAL HIGH (ref 70–99)
Potassium: 4.6 mmol/L (ref 3.5–5.2)
Sodium: 135 mmol/L (ref 134–144)
Total Protein: 6.4 g/dL (ref 6.0–8.5)
eGFR: 93 mL/min/{1.73_m2} (ref >59)

## 2022-02-13 MED ORDER — INSULIN DETEMIR 100 UNIT/ML FLEXPEN: 6.0000 [IU] | PEN_INJECTOR | Freq: Every day | SUBCUTANEOUS | 0 refills | Status: DC

## 2022-02-13 MED ORDER — INSULIN LISPRO (1 UNIT DIAL) 100 UNIT/ML (KWIKPEN): 4.0000 [IU] | PEN_INJECTOR | Freq: Three times a day (TID) | SUBCUTANEOUS | 1 refills | Status: DC

## 2022-02-13 NOTE — Progress Notes (Signed)
02/13/2022, 4:48 PM  Endocrinology follow-up note   Subjective:    Patient ID: Alexander Banks, male    DOB: 1955-09-27.  [name] is being seen in follow-up after he was seen in consultation for management of currently uncontrolled symptomatic diabetes requested by  [name], MD. Patient is being considered for possible Whipple's procedure for locally advanced pancreatic cancer.   Past Medical History:  Diagnosis Date   Anemia, iron deficiency    Diabetes mellitus without complication ([place])    on meds   Hearing loss    Bil hearing aids   Hyperlipidemia    Hypertension    Port-A-Cath in place 12/26/2020   Prediabetes    Sleep apnea     Past Surgical History:  Procedure Laterality Date   BILIARY STENT PLACEMENT N/A 11/09/2020   Procedure: BILIARY STENT PLACEMENT - 11 F X 5 CM STENT;  Surgeon: Rogene Houston, MD;  Location: [facility];  Service: Endoscopy;  Laterality: N/A;   BILIARY STENT PLACEMENT N/A 12/27/2020   Procedure: BILIARY STENT PLACEMENT;  Surgeon: Rogene Houston, MD;  Location: [facility];  Service: Endoscopy;  Laterality: N/A;   COLONOSCOPY     ENTEROSCOPY N/A 12/27/2021   Procedure: ENTEROSCOPY;  Surgeon: Mansouraty, Telford Nab., MD;  Location: [facility] ENDOSCOPY;  Service: Gastroenterology;  Laterality: N/A;   ERCP N/A 11/09/2020   Procedure: ENDOSCOPIC RETROGRADE CHOLANGIOPANCREATOGRAPHY (ERCP);  Surgeon: Rogene Houston, MD;  Location: [facility];  Service: Endoscopy;  Laterality: N/A;   ERCP N/A 12/27/2020   Procedure: ENDOSCOPIC RETROGRADE CHOLANGIOPANCREATOGRAPHY (ERCP);  Surgeon: Rogene Houston, MD;  Location: [facility];  Service: Endoscopy;  Laterality: N/A;   ESOPHAGOGASTRODUODENOSCOPY (EGD) WITH PROPOFOL N/A 11/30/2020   Procedure: ESOPHAGOGASTRODUODENOSCOPY (EGD) WITH PROPOFOL;  Surgeon: Milus Banister, MD;  Location: [facility];  Service: Endoscopy;  Laterality:  N/A;   EUS N/A 11/30/2020   Procedure: UPPER ENDOSCOPIC ULTRASOUND (EUS) RADIAL;  Surgeon: Milus Banister, MD;  Location: [facility] END[facility];  Service: Endoscopy;  Laterality: N/A;   FINE NEEDLE ASPIRATION N/A 11/30/2020   Procedure: FINE NEEDLE ASPIRATION (FNA) LINEAR;  Surgeon: Milus Banister, MD;  Location: [facility];  Service: Endoscopy;  Laterality: N/A;   GIVENS CAPSULE STUDY     IR BILIARY DRAIN PLACEMENT WITH CHOLANGIOGRAM  02/04/2022   IR IMAGING GUIDED PORT INSERTION  12/18/2020   KNEE SURGERY     4 surgeries on left, 2 on right knee   SHOULDER SURGERY     right shoulder   SPHINCTEROTOMY N/A 11/09/2020   Procedure: SHORT BILIARY SPHINCTEROTOMY;  Surgeon: Rogene Houston, MD;  Location: [facility];  Service: Endoscopy;  Laterality: N/A;   WHIPPLE PROCEDURE      Social History   Socioeconomic History   Marital status: Married    Spouse name: Not on file   Number of children: Not on file   Years of education: Not on file   Highest education level: Not on file  Occupational History   Not on file  Tobacco Use   Smoking status: Every Day    Types: Cigars   Smokeless tobacco:  Never   Tobacco comments:    Smoke 3-4 cigars a day  Vaping Use   Vaping Use: Never used  Substance and Sexual Activity   Alcohol use: Yes    Comment: occasional   Drug use: Never   Sexual activity: Not on file  Other Topics Concern   Not on file  Social History Narrative   Not on file   Social Determinants of Health   Financial Resource Strain: Low Risk  (12/20/2020)   Overall Financial Resource Strain (CARDIA)    Difficulty of Paying Living Expenses: Not hard at all  Food Insecurity: No Food Insecurity (02/02/2022)   Hunger Vital Sign    Worried About Running Out of Food in the Last Year: Never true    Ran Out of Food in the Last Year: Never true  Transportation Needs: No Transportation Needs (02/02/2022)   PRAPARE - Hydrologist (Medical): No    Lack of  Transportation (Non-Medical): No  Physical Activity: Sufficiently Active (12/20/2020)   Exercise Vital Sign    Days of Exercise per Week: 5 days    Minutes of Exercise per Session: 30 min  Recent Concern: Physical Activity - Inactive (11/17/2020)   Exercise Vital Sign    Days of Exercise per Week: 0 days    Minutes of Exercise per Session: 0 min  Stress: Not on file  Social Connections: Not on file    Family History  Problem Relation Age of Onset   Heart attack Father    Heart attack Paternal Grandfather    Leukemia Sister    Heart attack Brother     Outpatient Encounter Medications as of 02/13/2022  Medication Sig   amoxicillin-clavulanate (AUGMENTIN) 875-125 MG tablet Take 1 tablet by mouth every 12 (twelve) hours.   apixaban (ELIQUIS) 5 MG TABS tablet Take 1 tablet (5 mg total) by mouth 2 (two) times daily.   azelastine (ASTELIN) 0.1 % nasal spray Place 2 sprays into both nostrils 2 (two) times daily. (Patient taking differently: Place 2 sprays into both nostrils 2 (two) times daily as needed for allergies.)   Continuous Blood Gluc Sensor (FREESTYLE LIBRE 3 SENSOR) MISC 1 Piece by Does not apply route every 14 (fourteen) days. Place 1 sensor on the skin every 14 days. Use to check glucose continuously Dx E11.65   insulin detemir (LEVEMIR) 100 UNIT/ML FlexPen Inject 6 Units into the skin at bedtime.   insulin lispro (HUMALOG KWIKPEN) 100 UNIT/ML KwikPen Inject 4-7 Units into the skin 3 (three) times daily before meals.   levothyroxine (SYNTHROID) 175 MCG tablet TAKE 1 TABLET DAILY (Patient taking differently: Take 175 mcg by mouth daily before breakfast.)   lipase/protease/amylase (CREON) 36000 UNITS CPEP capsule Take 2 capsules (72,000 Units total) by mouth 3 (three) times daily with meals. May also take 1 capsule (36,000 Units total) as needed (with snacks). (Patient taking differently: Take 1 capsule (36,000 Units total) by mouth 3 (three) times daily with meals. May also take 1 capsule  (36,000 Units total) as needed (with snacks).)   oxyCODONE (OXY IR/ROXICODONE) 5 MG immediate release tablet Take 5 mg by mouth every 6 (six) hours as needed for severe pain.   sildenafil (VIAGRA) 100 MG tablet TAKE 1/2 TO 1 TABLET BY MOUTH AS NEEDED FOR ERECTILE DYSFUNCTION (Patient taking differently: Take 50-100 mg by mouth as needed for erectile dysfunction.)   sodium chloride flush (NS) 0.9 % SOLN 5-10 mLs by Intracatheter route daily. Flush biliary drain with 5  mL normal saline everyday.   [DISCONTINUED] insulin detemir (LEVEMIR) 100 UNIT/ML FlexPen Inject 8 Units into the skin at bedtime.   [DISCONTINUED] insulin lispro (HUMALOG KWIKPEN) 100 UNIT/ML KwikPen Inject 4-7 Units into the skin 3 (three) times daily before meals.   [DISCONTINUED] levothyroxine (SYNTHROID) 25 MCG tablet Take one tablet po daily but on Sunday take 1 tablet (Patient taking differently: Take 25-50 mcg by mouth as directed. Take 2 tablets (50 mcg) Mon-Sat along with 175 mcg tablet but Take only 1 tablet (25 mcg) on Sunday along with 175 mcg tablet)   No facility-administered encounter medications on file as of 02/13/2022.    ALLERGIES: Allergies  Allergen Reactions   Lisinopril Cough    VACCINATION STATUS: Immunization History  Administered Date(s) Administered   Influenza,inj,Quad PF,6+ Mos 04/15/2018, 04/09/2019, 02/28/2021   Pneumococcal Polysaccharide-23 08/10/2013   Td 06/18/2002, 11/16/2009     Hyperlipidemia This is a chronic problem. The current episode started more than 1 year ago. He has tried statins for the symptoms.  Diabetes He presents for his follow-up diabetic visit. He has type 2 diabetes mellitus. His disease course has been improving. There are no hypoglycemic associated symptoms. Pertinent negatives for hypoglycemia include no confusion, pallor or seizures. Associated symptoms include polydipsia and polyuria. Pertinent negatives for diabetes include no polyphagia. There are no hypoglycemic  complications. Risk factors for coronary artery disease include diabetes mellitus, dyslipidemia and male sex. Current diabetic treatment includes insulin injections. His weight is decreasing steadily. He is following a generally unhealthy diet. He has had a previous visit with a dietitian. His home blood glucose trend is decreasing steadily. His breakfast blood glucose range is generally 90-110 mg/dl. His lunch blood glucose range is generally 110-130 mg/dl. His dinner blood glucose range is generally 110-130 mg/dl. His bedtime blood glucose range is generally 110-130 mg/dl. His overall blood glucose range is 110-130 mg/dl. (Mr. Cappiello presents with his CGM device.  AGP report shows 92% time range, 0% hypoglycemia, 8% level 1 hypoglycemia.  His recent A1c was 5.6%.     His diabetes is pancreatic diabetes status post Whipple procedure for locally invasive pancreatic cancer.  In the interval, he did have a relapse of his cancer with blockage of the Libre drainage.  This required placement of a drain.  He reports poor appetite.  He comes with unintended weight loss.  He remains on Creon 1-2 capsules 3-4 times a day.    )     Review of Systems  Constitutional:  Negative for unexpected weight change.  HENT:  Negative for dental problem, mouth sores and trouble swallowing.   Eyes:  Negative for visual disturbance.  Respiratory:  Negative for choking, chest tightness, shortness of breath and wheezing.   Cardiovascular:  Negative for palpitations and leg swelling.  Gastrointestinal:  Negative for abdominal distention, constipation and diarrhea.  Endocrine: Positive for polydipsia and polyuria. Negative for polyphagia.  Genitourinary:  Negative for dysuria, flank pain, hematuria and urgency.  Musculoskeletal:  Negative for back pain and gait problem.  Skin:  Negative for pallor and wound.  Neurological:  Negative for seizures and syncope.  Psychiatric/Behavioral:  Negative for confusion and dysphoric  mood.     Objective:       02/13/2022    3:48 PM 02/13/2022    9:39 AM 02/06/2022    1:20 PM  Vitals with BMI  Height  '6\' 3"'$    Weight 162 lbs 3 oz 163 lbs   BMI 36.14 43.15   Systolic 400  82 371  Diastolic 66 56 81  Pulse  80 77    BP (!) 82/56   Pulse 80   Ht '6\' 3"'$  (1.905 m)   Wt 163 lb (73.9 kg)   BMI 20.37 kg/m   Wt Readings from Last 3 Encounters:  02/13/22 162 lb 3.2 oz (73.6 kg)  02/13/22 163 lb (73.9 kg)  02/01/22 179 lb 0.2 oz (81.2 kg)       CMP ( most recent) CMP     Component Value Date/Time   NA 135 02/12/2022 1027   K 4.6 02/12/2022 1027   CL 101 02/12/2022 1027   CO2 22 02/12/2022 1027   GLUCOSE 126 (H) 02/12/2022 1027   GLUCOSE 155 (H) 02/06/2022 1332   BUN 12 02/12/2022 1027   CREATININE 0.92 02/12/2022 1027   CREATININE 1.10 05/31/2013 1020   CALCIUM 8.1 (L) 02/12/2022 1027   PROT 6.4 02/12/2022 1027   ALBUMIN 2.9 (L) 02/12/2022 1027   AST 62 (H) 02/12/2022 1027   ALT 54 (H) 02/12/2022 1027   ALKPHOS 299 (H) 02/12/2022 1027   BILITOT 5.9 (H) 02/12/2022 1027   GFRNONAA >60 02/06/2022 1332   GFRAA 87 06/02/2020 0812     Diabetic Labs (most recent): Lab Results  Component Value Date   HGBA1C 5.6 12/11/2021   HGBA1C 5.8 10/10/2021   HGBA1C 6.8 04/09/2021     Lipid Panel ( most recent) Lipid Panel     Component Value Date/Time   CHOL 78 (L) 07/19/2021 0937   TRIG 67 07/19/2021 0937   HDL 30 (L) 07/19/2021 0937   CHOLHDL 2.6 07/19/2021 0937   CHOLHDL 3.5 05/31/2013 1020   VLDL 23 05/31/2013 1020   LDLCALC 33 07/19/2021 0937   LABVLDL 15 07/19/2021 0937      Lab Results  Component Value Date   TSH 9.980 (H) 10/19/2021   TSH 9.410 (H) 07/19/2021   TSH 39.931 (H) 05/31/2021   TSH 3.350 12/22/2020   TSH 0.529 11/03/2020   TSH 0.438 (L) 06/02/2020   TSH 1.070 11/24/2019   TSH 5.940 (H) 04/05/2019   TSH 0.225 (L) 10/07/2018   TSH 1.210 04/08/2018   FREET4 1.23 10/19/2021   FREET4 0.88 05/31/2021   FREET4 1.22 12/22/2020    FREET4 1.65 12/09/2016   FREET4 0.92 11/28/2014      Assessment & Plan:   1. Type 2 diabetes mellitus with stage 3a chronic kidney disease, with long-term current use of insulin ([place])   - Caidyn OLUWADAMILOLA ROSAMOND has currently uncontrolled symptomatic type 2 DM since  66 years of age.  Mr. Every presents with his CGM device.  AGP report shows 92% time range, 0% hypoglycemia, 8% level 1 hypoglycemia.  His recent A1c was 5.6%.     His diabetes is pancreatic diabetes status post Whipple procedure for locally invasive pancreatic cancer.  In the interval, he did have a relapse of his cancer with blockage of the Libre drainage.  This required placement of a drain.  He reports poor appetite.  He comes with unintended weight loss.  He remains on Creon 1-2 capsules 3-4 times a day.    -His interval developments were reviewed.  He was diagnosed with pancreatic cancer, currently being prepared for rounds of chemotherapy to be followed by surgery.   - I had a long discussion with him about the progressive nature of diabetes and the pathology behind its complications. He will need further study to classify his diabetes properly.  He will have an Tylosin  antibodies and antiglutamic acid decarboxylase antibodies measurements. -his diabetes is complicated by recent severe hyperglycemia, DKA and he remains at a high risk for more acute and chronic complications which include CAD, CVA, CKD, retinopathy, and neuropathy. These are all discussed in detail with him.  Patient is experiencing color nutrition deficit due to poor oral intake.  He has poor appetite due to recurrent pancreatic cancer with blockage of biliary duct.  He should be considered for feeding mechanism.   - I have approached him with the following individualized plan to manage  his diabetes and patient agrees:   -He is status post Whipple procedure.    He will be requiring insulin, however at a much lower dose.  He is advised to lower his  Levemir to 6 units nightly, continue Humalog 4 units 3 times daily AC.   - Adjustment parameters are given to him for hypo and hyperglycemia in writing. - he is encouraged to call clinic for blood glucose levels less than 70 or above 300 mg /dl. -He is benefiting from CGM.  He is advised to continue his CGM  continuously.   - Specific targets for  A1c;  LDL, HDL,  and Triglycerides were discussed with the patient.  2) Blood Pressure /Hypertension:    His blood pressure is tightly controlled at 82/56.  Patient is reporting dizziness nor lightheadedness.  He is currently not on antihypertensive medications.  He may need IV fluid support to prevent his blood pressure from dropping further.  He is going to see his PMD today.  3) Lipids/Hyperlipidemia:   Review of his recent lipid panel showed uncontrolled triglyceride at 182, controlled LDL at 51.  He is currently on Crestor 40 mg p.o. nightly.    4)  Weight/Diet:  Body mass index is 20.37 kg/m.    Over the years he lost weight, initially intentionally, later unintentionally.  He has history of moderate alcohol consumption for decades.  He is not suspect for malabsorption at this time.  He is not a candidate for weight loss.  He is advised to continue Creon 72K units 3 times a day with meals.   5) hypothyroidism: Circumstance of diagnosis is not available.  He denies any surgery or thyroid ablation.  His recent labs confirm Hashimoto's thyroiditis as a cause of his hypothyroidism.  Thyroid see will be to avoid over replacement in the patient.  I advised him to keep his levothyroxine at 175 mcg p.o. daily before breakfast.    6) Chronic Care/Health Maintenance:  -he  is on Statin medications and  is encouraged to initiate and continue to follow up with Ophthalmology, Dentist,  Podiatrist at least yearly or according to recommendations, and advised to  quit smoking. I have recommended yearly flu vaccine and pneumonia vaccine at least every 5 years;  moderate intensity exercise for up to 150 minutes weekly; and  sleep for at least 7 hours a day.  He is encouraged to continue follow-up with his oncology team regarding a recent diagnosis of pancreatic cancer.    The patient was counseled on the dangers of tobacco use, and was advised to quit.  Reviewed strategies to maximize success, including removing cigarettes and smoking materials from environment.   - he is  advised to maintain close follow up with [name], MD for primary care needs, as well as his other providers for optimal and coordinated care.    I spent 35 minutes in the care of the patient today including review of  labs from [facility], Lipids, Thyroid Function, Hematology (current and previous including abstractions from other facilities); face-to-face time discussing  his blood glucose readings/logs, discussing hypoglycemia and hyperglycemia episodes and symptoms, medications doses, his options of short and long term treatment based on the latest standards of care / guidelines;  discussion about incorporating lifestyle medicine;  and documenting the encounter. Risk reduction counseling performed per USPSTF guidelines to reduce cardiovascular risk factors.     Please refer to Patient Instructions for Blood Glucose Monitoring and Insulin/Medications Dosing Guide"  in media tab for additional information. Please  also refer to " Patient Self Inventory" in the Media  tab for reviewed elements of pertinent patient history.  [name] participated in the discussions, expressed understanding, and voiced agreement with the above plans.  All questions were answered to his satisfaction. he is encouraged to contact clinic should he have any questions or concerns prior to his return visit.    Follow up plan: - Return in about 3 months (around 05/15/2022) for Bring Meter/CGM Device/Logs- A1c in Office.  Glade Lloyd, MD [facility] [address] [place], [place] [address] Phone: [phone]  Fax: [phone]    02/13/2022, 4:48 PM  This note was partially dictated with voice recognition software. Similar sounding words can be transcribed inadequately or may not  be corrected upon review.

## 2022-02-13 NOTE — Progress Notes (Unsigned)
   Subjective:    Patient ID: Alexander Banks, male    DOB: 1956/04/12, 66 y.o.   MRN: [id number]  HPI Pt arrives for follow up. Pt states his back pain keeps him up at night and he is unable to sleep. States "back hurts all the time". Incision is doing well where stent was placed. Pt seen Dr.Nida this morning.   Patient with pancreatic cancer Has a stent placed in that goes externally Draining well for the most part There has been no fever no mucus no chills.  Patient with significant epigastric pain left flank pain back pain Poor appetite.  Not drinking well.  Patient has been losing weight.  No diarrhea issues.  Having to use oxycodone some for pain Review of Systems     Objective:   Physical Exam Lungs clear respiratory rate normal heart regular Abdomen is soft no guarding rebound or tenderness Weight loss noted     Assessment & Plan:  Epic gastric pain back pain and left side pain ultrasound of upper abdomen to look at the pancreas as well as a look at liver and trying to make sure that there is no obstruction where the IR tube is.  Recent lab work shows elevated liver enzymes but bilirubin, now we will repeat lab work in the morning.  Patient was advised if he starts having high fever chills sweats to immediately go to the ER  Oxycodone for pain caution drowsiness Lab work ultrasound pending  We will coordinate with oncology patient may benefit from IV fluids in the morning

## 2022-02-14 ENCOUNTER — Inpatient Hospital Stay: Payer: BC Managed Care – PPO

## 2022-02-14 ENCOUNTER — Other Ambulatory Visit: Payer: Self-pay

## 2022-02-14 ENCOUNTER — Ambulatory Visit (HOSPITAL_COMMUNITY)
Admission: RE | Admit: 2022-02-14 | Discharge: 2022-02-14 | Disposition: A | Discharge status: Home / Self Care | Payer: BC Managed Care – PPO | Source: Ambulatory Visit | Attending: Family Medicine | Admitting: Family Medicine

## 2022-02-14 ENCOUNTER — Other Ambulatory Visit (HOSPITAL_COMMUNITY)
Admission: RE | Admit: 2022-02-14 | Discharge: 2022-02-14 | Disposition: A | Discharge status: Home / Self Care | Payer: BC Managed Care – PPO | Source: Ambulatory Visit | Attending: Family Medicine | Admitting: Family Medicine

## 2022-02-14 VITALS — BP 95/59 | HR 93 | Temp 96.2°F | Resp 18

## 2022-02-14 DIAGNOSIS — C259 Malignant neoplasm of pancreas, unspecified: Secondary | ICD-10-CM | POA: Diagnosis not present

## 2022-02-14 DIAGNOSIS — Z95828 Presence of other vascular implants and grafts: Secondary | ICD-10-CM

## 2022-02-14 DIAGNOSIS — R109 Unspecified abdominal pain: Secondary | ICD-10-CM | POA: Insufficient documentation

## 2022-02-14 DIAGNOSIS — R7401 Elevation of levels of liver transaminase levels: Secondary | ICD-10-CM | POA: Diagnosis not present

## 2022-02-14 DIAGNOSIS — R17 Unspecified jaundice: Secondary | ICD-10-CM | POA: Diagnosis not present

## 2022-02-14 DIAGNOSIS — Z79899 Other long term (current) drug therapy: Secondary | ICD-10-CM | POA: Diagnosis not present

## 2022-02-14 DIAGNOSIS — K5900 Constipation, unspecified: Secondary | ICD-10-CM | POA: Diagnosis not present

## 2022-02-14 DIAGNOSIS — R1013 Epigastric pain: Secondary | ICD-10-CM | POA: Diagnosis not present

## 2022-02-14 DIAGNOSIS — R140 Abdominal distension (gaseous): Secondary | ICD-10-CM | POA: Diagnosis not present

## 2022-02-14 LAB — BASIC METABOLIC PANEL
Anion gap: 7 (ref 5–15)
BUN: 16 mg/dL (ref 8–23)
CO2: 23 mmol/L (ref 22–32)
Calcium: 8.3 mg/dL — ABNORMAL LOW (ref 8.9–10.3)
Chloride: 104 mmol/L (ref 98–111)
Creatinine, Ser: 1.02 mg/dL (ref 0.61–1.24)
GFR, Estimated: >60 mL/min (ref >60)
Glucose, Bld: 145 mg/dL — ABNORMAL HIGH (ref 70–99)
Potassium: 4.6 mmol/L (ref 3.5–5.1)
Sodium: 134 mmol/L — ABNORMAL LOW (ref 135–145)

## 2022-02-14 LAB — HEPATIC FUNCTION PANEL
ALT: 58 U/L — ABNORMAL HIGH (ref 0–44)
AST: 63 U/L — ABNORMAL HIGH (ref 15–41)
Albumin: 2.5 g/dL — ABNORMAL LOW (ref 3.5–5.0)
Alkaline Phosphatase: 277 U/L — ABNORMAL HIGH (ref 38–126)
Bilirubin, Direct: 3 mg/dL — ABNORMAL HIGH (ref 0.0–0.2)
Indirect Bilirubin: 3.1 mg/dL — ABNORMAL HIGH (ref 0.3–0.9)
Total Bilirubin: 6.1 mg/dL — ABNORMAL HIGH (ref 0.3–1.2)
Total Protein: 7.2 g/dL (ref 6.5–8.1)

## 2022-02-14 LAB — SEDIMENTATION RATE: Sed Rate: 87 mm/hr — ABNORMAL HIGH (ref 0–16)

## 2022-02-14 LAB — LIPASE, BLOOD: Lipase: 19 U/L (ref 11–51)

## 2022-02-14 MED ORDER — SODIUM CHLORIDE 0.9% FLUSH
10.0000 mL | INTRAVENOUS | Status: DC | PRN
  Administered 2022-02-14: 10 mL via INTRAVENOUS

## 2022-02-14 MED ORDER — SODIUM CHLORIDE 0.9 % IV SOLN: INTRAVENOUS | Status: DC

## 2022-02-14 MED ORDER — OXYCODONE HCL 5 MG PO TABS: 5.0000 mg | ORAL_TABLET | ORAL | 0 refills | Status: DC | PRN

## 2022-02-14 MED ORDER — HEPARIN SOD (PORK) LOCK FLUSH 100 UNIT/ML IV SOLN
500.0000 [IU] | Freq: Once | INTRAVENOUS | Status: AC
  Administered 2022-02-14: 500 [IU] via INTRAVENOUS

## 2022-02-14 NOTE — Patient Instructions (Signed)
[facility]  Discharge Instructions: Thank you for choosing [facility] to provide your oncology and hematology care.  If you have a lab appointment with the [facility], please come in thru the Main Entrance and check in at the main information desk.  Wear comfortable clothing and clothing appropriate for easy access to any Portacath or PICC line.   We strive to give you quality time with your provider. You may need to reschedule your appointment if you arrive late (15 or more minutes).  Arriving late affects you and other patients whose appointments are after yours.  Also, if you miss three or more appointments without notifying the office, you may be dismissed from the clinic at the provider's discretion.      For prescription refill requests, have your pharmacy contact our office and allow 72 hours for refills to be completed.    Hydration fluids given per orders today   To help prevent nausea and vomiting after your treatment, we encourage you to take your nausea medication as directed.  BELOW ARE SYMPTOMS THAT SHOULD BE REPORTED IMMEDIATELY: *FEVER GREATER THAN 100.4 F (38 C) OR HIGHER *CHILLS OR SWEATING *NAUSEA AND VOMITING THAT IS NOT CONTROLLED WITH YOUR NAUSEA MEDICATION *UNUSUAL SHORTNESS OF BREATH *UNUSUAL BRUISING OR BLEEDING *URINARY PROBLEMS (pain or burning when urinating, or frequent urination) *BOWEL PROBLEMS (unusual diarrhea, constipation, pain near the anus) TENDERNESS IN MOUTH AND THROAT WITH OR WITHOUT PRESENCE OF ULCERS (sore throat, sores in mouth, or a toothache) UNUSUAL RASH, SWELLING OR PAIN  UNUSUAL VAGINAL DISCHARGE OR ITCHING   Items with * indicate a potential emergency and should be followed up as soon as possible or go to the Emergency Department if any problems should occur.  Please show the CHEMOTHERAPY ALERT CARD or IMMUNOTHERAPY ALERT CARD at check-in to the Emergency Department and triage nurse.  Should you  have questions after your visit or need to cancel or reschedule your appointment, please contact [facility] [phone]  and follow the prompts.  Office hours are 8:00 a.m. to 4:30 p.m. Monday - Friday. Please note that voicemails left after 4:00 p.m. may not be returned until the following business day.  We are closed weekends and major holidays. You have access to a nurse at all times for urgent questions. Please call the main number to the clinic [phone] and follow the prompts.  For any non-urgent questions, you may also contact your provider using MyChart. We now offer e-Visits for anyone 17 and older to request care online for non-urgent symptoms. For details visit [email or web address].   Also download the MyChart app! Go to the app store, search "MyChart", open the app, select [facility], and log in with your MyChart username and password.  Masks are optional in the cancer centers. If you would like for your care team to wear a mask while they are taking care of you, please let them know. You may have one support person who is at least 64 years old accompany you for your appointments.

## 2022-02-14 NOTE — Progress Notes (Signed)
Patient presents today for IVF.  Patient will receive 1 L of NS over 2 hours.  Patient is in stable condition with no new complaints voiced.  He states he is feeling better today than he has in a week.  Vital signs are stable.  We will proceed with IVF per provider orders.

## 2022-02-14 NOTE — Progress Notes (Signed)
Hydration fluids given per orders. Patient tolerated it well without problems. Vitals stable and discharged home from clinic ambulatory. Follow up as scheduled.  

## 2022-02-16 NOTE — Progress Notes (Signed)
Patient's PCP (Dr. Sallee Lange) reached out to me with concerns about patient's condition following recent hospital discharge.  Patient brought in for IV fluids at cancer center today.  Brief assessment performed at bedside: Abdominal pain: Mid abdominal pain feels similar to "cancer pain" the patient had prior to his Whipple procedure.  Oxycodone 5 mg / 325 mg is helping. Weight loss: Severe weight loss noted, has lost 15 pounds in the past 2 weeks.  Patient has little to no appetite and is "barely eating."  He previously tried Megace and Marinol without any improvement in symptoms.  Patient sent home with case of Ensure and will see dietitian on 02/18/2022. Dehydration: Drinking approximately 48 ounces of water daily.  Patient has borderline tachycardia and marginal blood pressure at today's visit.  We will schedule for IV fluids next week on Monday and Thursday, with repeat labs on Thursday as well.  Harriett Rush  02/16/22 12:12 AM

## 2022-02-18 ENCOUNTER — Inpatient Hospital Stay: Payer: BC Managed Care – PPO | Attending: Hematology

## 2022-02-18 ENCOUNTER — Inpatient Hospital Stay: Payer: BC Managed Care – PPO | Admitting: Dietician

## 2022-02-18 VITALS — BP 97/69 | HR 90 | Temp 98.6°F | Resp 20 | Wt 159.6 lb

## 2022-02-18 DIAGNOSIS — E785 Hyperlipidemia, unspecified: Secondary | ICD-10-CM | POA: Diagnosis not present

## 2022-02-18 DIAGNOSIS — Z9049 Acquired absence of other specified parts of digestive tract: Secondary | ICD-10-CM | POA: Diagnosis not present

## 2022-02-18 DIAGNOSIS — M47814 Spondylosis without myelopathy or radiculopathy, thoracic region: Secondary | ICD-10-CM | POA: Insufficient documentation

## 2022-02-18 DIAGNOSIS — I700 Atherosclerosis of aorta: Secondary | ICD-10-CM | POA: Insufficient documentation

## 2022-02-18 DIAGNOSIS — K8689 Other specified diseases of pancreas: Secondary | ICD-10-CM | POA: Insufficient documentation

## 2022-02-18 DIAGNOSIS — Z79890 Hormone replacement therapy (postmenopausal): Secondary | ICD-10-CM | POA: Diagnosis not present

## 2022-02-18 DIAGNOSIS — E119 Type 2 diabetes mellitus without complications: Secondary | ICD-10-CM | POA: Diagnosis not present

## 2022-02-18 DIAGNOSIS — N281 Cyst of kidney, acquired: Secondary | ICD-10-CM | POA: Diagnosis not present

## 2022-02-18 DIAGNOSIS — Z79899 Other long term (current) drug therapy: Secondary | ICD-10-CM | POA: Diagnosis not present

## 2022-02-18 DIAGNOSIS — Z86718 Personal history of other venous thrombosis and embolism: Secondary | ICD-10-CM | POA: Diagnosis not present

## 2022-02-18 DIAGNOSIS — Z7901 Long term (current) use of anticoagulants: Secondary | ICD-10-CM | POA: Insufficient documentation

## 2022-02-18 DIAGNOSIS — C259 Malignant neoplasm of pancreas, unspecified: Secondary | ICD-10-CM | POA: Diagnosis not present

## 2022-02-18 DIAGNOSIS — N400 Enlarged prostate without lower urinary tract symptoms: Secondary | ICD-10-CM | POA: Diagnosis not present

## 2022-02-18 DIAGNOSIS — Z90411 Acquired partial absence of pancreas: Secondary | ICD-10-CM | POA: Insufficient documentation

## 2022-02-18 DIAGNOSIS — F17290 Nicotine dependence, other tobacco product, uncomplicated: Secondary | ICD-10-CM | POA: Insufficient documentation

## 2022-02-18 DIAGNOSIS — I10 Essential (primary) hypertension: Secondary | ICD-10-CM | POA: Insufficient documentation

## 2022-02-18 DIAGNOSIS — Z95828 Presence of other vascular implants and grafts: Secondary | ICD-10-CM

## 2022-02-18 DIAGNOSIS — K831 Obstruction of bile duct: Secondary | ICD-10-CM | POA: Insufficient documentation

## 2022-02-18 MED ORDER — HEPARIN SOD (PORK) LOCK FLUSH 100 UNIT/ML IV SOLN
500.0000 [IU] | Freq: Once | INTRAVENOUS | Status: AC
  Administered 2022-02-18: 500 [IU] via INTRAVENOUS

## 2022-02-18 MED ORDER — SODIUM CHLORIDE 0.9% FLUSH
10.0000 mL | Freq: Once | INTRAVENOUS | Status: AC
  Administered 2022-02-18: 10 mL via INTRAVENOUS

## 2022-02-18 MED ORDER — SODIUM CHLORIDE 0.9 % IV SOLN: INTRAVENOUS | Status: DC

## 2022-02-18 NOTE — Progress Notes (Signed)
Nutrition Follow-up:  Patient with pancreatic cancer. S/p neoadjuvant Folfirinox (8/24-10/26) followed by [facility] under care of Dr. Renato Shin 05/15/21 and adjuvant gemcitabine + xeloda  9/6 - fine-needle biopsy of new lesion showed recurrence  Noted 9/15-9/20 hospital admission with obstructive juandice. Biliary drain placed 9/18.  Met with patient and wife in infusion. He is receiving IV fluids. Patient with eyes closed and resting this afternoon. He is awake and provides intermittent comment during visit. Patient reports drain in place. Output has been increasing ~1000 ml/day per wife. She reports pt has poor appetite and not taking in enough fluid with drain output. She is encouraging po intake often through out the day. Wife reports today has been a good day so far (bowl of cereal for breakfast, 1/2 chicken salad sandwich and ice cream cone for lunch, glass of water). He is not drinking Ensure regularly. Wife reports he has consumed 2 from case provided 9/28. Most days, pt will drink glass of chocolate milk in the morning after cup of coffee. He drinks this black.   Medications: oxycodone (9/28)  Labs: 9/28 labs - Na 134, glucose 145  Anthropometrics: Last wt 162 lb 3.2 oz on 9/27 decreased 9.5% (17 lbs) in 16 days; severe (hospitalization noted)   NUTRITION DIAGNOSIS: Unintentional weight loss continues    INTERVENTION:  Discussed strategies for increasing calories and protein (adding pat of butter to coffee, preparing choc milk with CIB powder, setting snack time alarm reminders q2h, having food out and easily accessible) Encouraged high protein bedtime snack Encouraged 3-4 Ensure Plus HP as tolerated (suggested adding to ice cream/smoothies) Provided samples of Boost VHC (530 kcal, 23 g protein) for pt to try Provided samples of CIB powder as well as Unjury protein powder) Provided samples of Pedialyte hydration powder to add to water   MONITORING, EVALUATION, GOAL: weight trends,  intake   NEXT VISIT: To be scheduled (PET 10/5 - MD to review imaging + caris results with pt 10/9)

## 2022-02-18 NOTE — Progress Notes (Signed)
Patient presents for 1L of normal saline. Patient tolerated therapy with no complaints voiced. Side effects with management reviewed with understanding verbalized. Port site clean and dry with no bruising or swelling noted at site. Good blood return noted before and after administration of therapy. Band aid applied. Patient left in satisfactory condition with VSS and no s/s of distress noted.

## 2022-02-18 NOTE — Patient Instructions (Signed)
[facility]  Discharge Instructions: Thank you for choosing [facility] to provide your oncology and hematology care.  If you have a lab appointment with the Cancer Center, please come in thru the Main Entrance and check in at the main information desk.  Wear comfortable clothing and clothing appropriate for easy access to any Portacath or PICC line.   We strive to give you quality time with your provider. You may need to reschedule your appointment if you arrive late (15 or more minutes).  Arriving late affects you and other patients whose appointments are after yours.  Also, if you miss three or more appointments without notifying the office, you may be dismissed from the clinic at the provider's discretion.      For prescription refill requests, have your pharmacy contact our office and allow 72 hours for refills to be completed.    Today you received the following 1L of normal saline, return as scheduled.   To help prevent nausea and vomiting after your treatment, we encourage you to take your nausea medication as directed.  BELOW ARE SYMPTOMS THAT SHOULD BE REPORTED IMMEDIATELY: *FEVER GREATER THAN 100.4 F (38 C) OR HIGHER *CHILLS OR SWEATING *NAUSEA AND VOMITING THAT IS NOT CONTROLLED WITH YOUR NAUSEA MEDICATION *UNUSUAL SHORTNESS OF BREATH *UNUSUAL BRUISING OR BLEEDING *URINARY PROBLEMS (pain or burning when urinating, or frequent urination) *BOWEL PROBLEMS (unusual diarrhea, constipation, pain near the anus) TENDERNESS IN MOUTH AND THROAT WITH OR WITHOUT PRESENCE OF ULCERS (sore throat, sores in mouth, or a toothache) UNUSUAL RASH, SWELLING OR PAIN  UNUSUAL VAGINAL DISCHARGE OR ITCHING   Items with * indicate a potential emergency and should be followed up as soon as possible or go to the Emergency Department if any problems should occur.  Please show the CHEMOTHERAPY ALERT CARD or IMMUNOTHERAPY ALERT CARD at check-in to the Emergency  Department and triage nurse.  Should you have questions after your visit or need to cancel or reschedule your appointment, please contact [facility] [phone]  and follow the prompts.  Office hours are 8:00 a.m. to 4:30 p.m. Monday - Friday. Please note that voicemails left after 4:00 p.m. may not be returned until the following business day.  We are closed weekends and major holidays. You have access to a nurse at all times for urgent questions. Please call the main number to the clinic [phone] and follow the prompts.  For any non-urgent questions, you may also contact your provider using MyChart. We now offer e-Visits for anyone 18 and older to request care online for non-urgent symptoms. For details visit [email or web address].   Also download the MyChart app! Go to the app store, search "MyChart", open the app, select [facility], and log in with your MyChart username and password.  Masks are optional in the cancer centers. If you would like for your care team to wear a mask while they are taking care of you, please let them know. You may have one support person who is at least 64 years old accompany you for your appointments.  

## 2022-02-19 ENCOUNTER — Inpatient Hospital Stay: Payer: BC Managed Care – PPO

## 2022-02-19 ENCOUNTER — Inpatient Hospital Stay: Payer: BC Managed Care – PPO | Admitting: Hematology

## 2022-02-19 DIAGNOSIS — C259 Malignant neoplasm of pancreas, unspecified: Secondary | ICD-10-CM

## 2022-02-21 ENCOUNTER — Inpatient Hospital Stay: Payer: BC Managed Care – PPO

## 2022-02-21 ENCOUNTER — Ambulatory Visit (HOSPITAL_COMMUNITY)
Admission: RE | Admit: 2022-02-21 | Discharge: 2022-02-21 | Disposition: A | Discharge status: Home / Self Care | Payer: BC Managed Care – PPO | Source: Ambulatory Visit | Attending: Hematology | Admitting: Hematology

## 2022-02-21 ENCOUNTER — Other Ambulatory Visit: Payer: Self-pay

## 2022-02-21 VITALS — BP 108/63 | HR 108 | Temp 96.8°F | Resp 20

## 2022-02-21 DIAGNOSIS — E119 Type 2 diabetes mellitus without complications: Secondary | ICD-10-CM | POA: Diagnosis not present

## 2022-02-21 DIAGNOSIS — Z79899 Other long term (current) drug therapy: Secondary | ICD-10-CM | POA: Diagnosis not present

## 2022-02-21 DIAGNOSIS — C259 Malignant neoplasm of pancreas, unspecified: Secondary | ICD-10-CM

## 2022-02-21 DIAGNOSIS — E860 Dehydration: Secondary | ICD-10-CM

## 2022-02-21 DIAGNOSIS — Z7901 Long term (current) use of anticoagulants: Secondary | ICD-10-CM | POA: Diagnosis not present

## 2022-02-21 DIAGNOSIS — E785 Hyperlipidemia, unspecified: Secondary | ICD-10-CM | POA: Diagnosis not present

## 2022-02-21 DIAGNOSIS — N400 Enlarged prostate without lower urinary tract symptoms: Secondary | ICD-10-CM | POA: Diagnosis not present

## 2022-02-21 DIAGNOSIS — I700 Atherosclerosis of aorta: Secondary | ICD-10-CM | POA: Diagnosis not present

## 2022-02-21 DIAGNOSIS — E875 Hyperkalemia: Secondary | ICD-10-CM

## 2022-02-21 DIAGNOSIS — M47814 Spondylosis without myelopathy or radiculopathy, thoracic region: Secondary | ICD-10-CM | POA: Diagnosis not present

## 2022-02-21 DIAGNOSIS — Z86718 Personal history of other venous thrombosis and embolism: Secondary | ICD-10-CM | POA: Diagnosis not present

## 2022-02-21 DIAGNOSIS — I10 Essential (primary) hypertension: Secondary | ICD-10-CM | POA: Diagnosis not present

## 2022-02-21 DIAGNOSIS — F17290 Nicotine dependence, other tobacco product, uncomplicated: Secondary | ICD-10-CM | POA: Diagnosis not present

## 2022-02-21 DIAGNOSIS — N281 Cyst of kidney, acquired: Secondary | ICD-10-CM | POA: Diagnosis not present

## 2022-02-21 DIAGNOSIS — K831 Obstruction of bile duct: Secondary | ICD-10-CM | POA: Diagnosis not present

## 2022-02-21 DIAGNOSIS — K8689 Other specified diseases of pancreas: Secondary | ICD-10-CM | POA: Diagnosis not present

## 2022-02-21 DIAGNOSIS — I999 Unspecified disorder of circulatory system: Secondary | ICD-10-CM | POA: Diagnosis not present

## 2022-02-21 DIAGNOSIS — Z79890 Hormone replacement therapy (postmenopausal): Secondary | ICD-10-CM | POA: Diagnosis not present

## 2022-02-21 LAB — CBC WITH DIFFERENTIAL/PLATELET
Abs Immature Granulocytes: 0.05 10*3/uL (ref 0.00–0.07)
Basophils Absolute: 0.1 10*3/uL (ref 0.0–0.1)
Basophils Relative: 1 %
Eosinophils Absolute: 0 10*3/uL (ref 0.0–0.5)
Eosinophils Relative: 0 %
HCT: 43.7 % (ref 39.0–52.0)
Hemoglobin: 14.2 g/dL (ref 13.0–17.0)
Immature Granulocytes: 1 %
Lymphocytes Relative: 13 %
Lymphs Abs: 1.3 10*3/uL (ref 0.7–4.0)
MCH: 32.3 pg (ref 26.0–34.0)
MCHC: 32.5 g/dL (ref 30.0–36.0)
MCV: 99.3 fL (ref 80.0–100.0)
Monocytes Absolute: 0.6 10*3/uL (ref 0.1–1.0)
Monocytes Relative: 6 %
Neutro Abs: 8.3 10*3/uL — ABNORMAL HIGH (ref 1.7–7.7)
Neutrophils Relative %: 79 %
Platelets: 402 10*3/uL — ABNORMAL HIGH (ref 150–400)
RBC: 4.4 MIL/uL (ref 4.22–5.81)
RDW: 16.9 % — ABNORMAL HIGH (ref 11.5–15.5)
WBC: 10.3 10*3/uL (ref 4.0–10.5)
nRBC: 0 % (ref 0.0–0.2)

## 2022-02-21 LAB — COMPREHENSIVE METABOLIC PANEL
ALT: 42 U/L (ref 0–44)
AST: 36 U/L (ref 15–41)
Albumin: 3.3 g/dL — ABNORMAL LOW (ref 3.5–5.0)
Alkaline Phosphatase: 224 U/L — ABNORMAL HIGH (ref 38–126)
Anion gap: 11 (ref 5–15)
BUN: 35 mg/dL — ABNORMAL HIGH (ref 8–23)
CO2: 22 mmol/L (ref 22–32)
Calcium: 9.4 mg/dL (ref 8.9–10.3)
Chloride: 99 mmol/L (ref 98–111)
Creatinine, Ser: 1.45 mg/dL — ABNORMAL HIGH (ref 0.61–1.24)
GFR, Estimated: 54 mL/min — ABNORMAL LOW (ref >60)
Glucose, Bld: 195 mg/dL — ABNORMAL HIGH (ref 70–99)
Potassium: 5.7 mmol/L — ABNORMAL HIGH (ref 3.5–5.1)
Sodium: 132 mmol/L — ABNORMAL LOW (ref 135–145)
Total Bilirubin: 4.7 mg/dL — ABNORMAL HIGH (ref 0.3–1.2)
Total Protein: 8.3 g/dL — ABNORMAL HIGH (ref 6.5–8.1)

## 2022-02-21 LAB — MAGNESIUM: Magnesium: 2.6 mg/dL — ABNORMAL HIGH (ref 1.7–2.4)

## 2022-02-21 MED ORDER — SODIUM CHLORIDE 0.9% FLUSH
10.0000 mL | INTRAVENOUS | Status: DC | PRN
  Administered 2022-02-21: 10 mL via INTRAVENOUS

## 2022-02-21 MED ORDER — FLUDEOXYGLUCOSE F - 18 (FDG) INJECTION
8.4900 | Freq: Once | INTRAVENOUS | Status: AC | PRN
  Administered 2022-02-21: 8.49 via INTRAVENOUS

## 2022-02-21 MED ORDER — INSULIN ASPART 100 UNIT/ML IJ SOLN
10.0000 [IU] | Freq: Once | INTRAMUSCULAR | Status: AC
  Administered 2022-02-21: 10 [IU] via INTRAVENOUS
  Filled 2022-02-21: qty 0.1

## 2022-02-21 MED ORDER — DEXTROSE 50 % IV SOLN
50.0000 mL | Freq: Once | INTRAVENOUS | Status: AC
  Administered 2022-02-21: 50 mL via INTRAVENOUS
  Filled 2022-02-21: qty 50

## 2022-02-21 MED ORDER — SODIUM CHLORIDE 0.9 % IV SOLN: Freq: Once | INTRAVENOUS | Status: AC

## 2022-02-21 MED ORDER — HEPARIN SOD (PORK) LOCK FLUSH 100 UNIT/ML IV SOLN
500.0000 [IU] | Freq: Once | INTRAVENOUS | Status: AC
  Administered 2022-02-21: 500 [IU] via INTRAVENOUS

## 2022-02-21 NOTE — Progress Notes (Signed)
Patient presents today for IVF.  Patient is in satisfactory condition with no new complaints voiced.  Vital signs are stable.  Labs reviewed.  Potassium today is 5.7, creatinine is 1.45, bilirubin is 4.7, sodium is 132.  Tarri Abernethy, PA-C made aware. We will proceed with 1 L of IVF over 2 hours per provider orders.   Per [name] we will give 10 units of insulin and one ampule of D50 today for hyperkalemia.    Patient tolerated IVF, insulin, and D50 well with no complaints voiced.  Patient left via wheelchair with wife in stable condition.  Vital signs stable at discharge.  Follow up as scheduled.

## 2022-02-21 NOTE — Patient Instructions (Signed)
[facility]  Discharge Instructions: Thank you for choosing [facility] to provide your oncology and hematology care.  If you have a lab appointment with the Cancer Center, please come in thru the Main Entrance and check in at the main information desk.  Wear comfortable clothing and clothing appropriate for easy access to any Portacath or PICC line.   We strive to give you quality time with your provider. You may need to reschedule your appointment if you arrive late (15 or more minutes).  Arriving late affects you and other patients whose appointments are after yours.  Also, if you miss three or more appointments without notifying the office, you may be dismissed from the clinic at the provider's discretion.      For prescription refill requests, have your pharmacy contact our office and allow 72 hours for refills to be completed.     To help prevent nausea and vomiting after your treatment, we encourage you to take your nausea medication as directed.  BELOW ARE SYMPTOMS THAT SHOULD BE REPORTED IMMEDIATELY: *FEVER GREATER THAN 100.4 F (38 C) OR HIGHER *CHILLS OR SWEATING *NAUSEA AND VOMITING THAT IS NOT CONTROLLED WITH YOUR NAUSEA MEDICATION *UNUSUAL SHORTNESS OF BREATH *UNUSUAL BRUISING OR BLEEDING *URINARY PROBLEMS (pain or burning when urinating, or frequent urination) *BOWEL PROBLEMS (unusual diarrhea, constipation, pain near the anus) TENDERNESS IN MOUTH AND THROAT WITH OR WITHOUT PRESENCE OF ULCERS (sore throat, sores in mouth, or a toothache) UNUSUAL RASH, SWELLING OR PAIN  UNUSUAL VAGINAL DISCHARGE OR ITCHING   Items with * indicate a potential emergency and should be followed up as soon as possible or go to the Emergency Department if any problems should occur.  Please show the CHEMOTHERAPY ALERT CARD or IMMUNOTHERAPY ALERT CARD at check-in to the Emergency Department and triage nurse.  Should you have questions after your visit or need to  cancel or reschedule your appointment, please contact [facility] [phone]  and follow the prompts.  Office hours are 8:00 a.m. to 4:30 p.m. Monday - Friday. Please note that voicemails left after 4:00 p.m. may not be returned until the following business day.  We are closed weekends and major holidays. You have access to a nurse at all times for urgent questions. Please call the main number to the clinic [phone] and follow the prompts.  For any non-urgent questions, you may also contact your provider using MyChart. We now offer e-Visits for anyone 18 and older to request care online for non-urgent symptoms. For details visit [email or web address].   Also download the MyChart app! Go to the app store, search "MyChart", open the app, select [facility], and log in with your MyChart username and password.  Masks are optional in the cancer centers. If you would like for your care team to wear a mask while they are taking care of you, please let them know. You may have one support person who is at least 64 years old accompany you for your appointments.  

## 2022-02-22 ENCOUNTER — Inpatient Hospital Stay: Payer: BC Managed Care – PPO

## 2022-02-22 ENCOUNTER — Other Ambulatory Visit: Payer: Self-pay

## 2022-02-22 VITALS — BP 108/74 | HR 105 | Temp 97.5°F | Resp 20

## 2022-02-22 DIAGNOSIS — Z95828 Presence of other vascular implants and grafts: Secondary | ICD-10-CM

## 2022-02-22 DIAGNOSIS — C259 Malignant neoplasm of pancreas, unspecified: Secondary | ICD-10-CM | POA: Diagnosis not present

## 2022-02-22 DIAGNOSIS — K831 Obstruction of bile duct: Secondary | ICD-10-CM | POA: Diagnosis not present

## 2022-02-22 DIAGNOSIS — I10 Essential (primary) hypertension: Secondary | ICD-10-CM | POA: Diagnosis not present

## 2022-02-22 DIAGNOSIS — F17290 Nicotine dependence, other tobacco product, uncomplicated: Secondary | ICD-10-CM | POA: Diagnosis not present

## 2022-02-22 DIAGNOSIS — N281 Cyst of kidney, acquired: Secondary | ICD-10-CM | POA: Diagnosis not present

## 2022-02-22 DIAGNOSIS — Z79899 Other long term (current) drug therapy: Secondary | ICD-10-CM | POA: Diagnosis not present

## 2022-02-22 DIAGNOSIS — M47814 Spondylosis without myelopathy or radiculopathy, thoracic region: Secondary | ICD-10-CM | POA: Diagnosis not present

## 2022-02-22 DIAGNOSIS — Z79890 Hormone replacement therapy (postmenopausal): Secondary | ICD-10-CM | POA: Diagnosis not present

## 2022-02-22 DIAGNOSIS — Z7901 Long term (current) use of anticoagulants: Secondary | ICD-10-CM | POA: Diagnosis not present

## 2022-02-22 DIAGNOSIS — E875 Hyperkalemia: Secondary | ICD-10-CM

## 2022-02-22 DIAGNOSIS — N400 Enlarged prostate without lower urinary tract symptoms: Secondary | ICD-10-CM | POA: Diagnosis not present

## 2022-02-22 DIAGNOSIS — E785 Hyperlipidemia, unspecified: Secondary | ICD-10-CM | POA: Diagnosis not present

## 2022-02-22 DIAGNOSIS — I700 Atherosclerosis of aorta: Secondary | ICD-10-CM | POA: Diagnosis not present

## 2022-02-22 DIAGNOSIS — E119 Type 2 diabetes mellitus without complications: Secondary | ICD-10-CM | POA: Diagnosis not present

## 2022-02-22 DIAGNOSIS — Z86718 Personal history of other venous thrombosis and embolism: Secondary | ICD-10-CM | POA: Diagnosis not present

## 2022-02-22 DIAGNOSIS — K8689 Other specified diseases of pancreas: Secondary | ICD-10-CM | POA: Diagnosis not present

## 2022-02-22 LAB — COMPREHENSIVE METABOLIC PANEL
ALT: 39 U/L (ref 0–44)
AST: 36 U/L (ref 15–41)
Albumin: 3.2 g/dL — ABNORMAL LOW (ref 3.5–5.0)
Alkaline Phosphatase: 209 U/L — ABNORMAL HIGH (ref 38–126)
Anion gap: 11 (ref 5–15)
BUN: 36 mg/dL — ABNORMAL HIGH (ref 8–23)
CO2: 21 mmol/L — ABNORMAL LOW (ref 22–32)
Calcium: 9.3 mg/dL (ref 8.9–10.3)
Chloride: 101 mmol/L (ref 98–111)
Creatinine, Ser: 1.42 mg/dL — ABNORMAL HIGH (ref 0.61–1.24)
GFR, Estimated: 55 mL/min — ABNORMAL LOW (ref >60)
Glucose, Bld: 198 mg/dL — ABNORMAL HIGH (ref 70–99)
Potassium: 5.6 mmol/L — ABNORMAL HIGH (ref 3.5–5.1)
Sodium: 133 mmol/L — ABNORMAL LOW (ref 135–145)
Total Bilirubin: 4.5 mg/dL — ABNORMAL HIGH (ref 0.3–1.2)
Total Protein: 8.3 g/dL — ABNORMAL HIGH (ref 6.5–8.1)

## 2022-02-22 LAB — CBC WITH DIFFERENTIAL/PLATELET
Abs Immature Granulocytes: 0.03 10*3/uL (ref 0.00–0.07)
Basophils Absolute: 0.1 10*3/uL (ref 0.0–0.1)
Basophils Relative: 1 %
Eosinophils Absolute: 0.1 10*3/uL (ref 0.0–0.5)
Eosinophils Relative: 1 %
HCT: 43.3 % (ref 39.0–52.0)
Hemoglobin: 14.3 g/dL (ref 13.0–17.0)
Immature Granulocytes: 0 %
Lymphocytes Relative: 14 %
Lymphs Abs: 1.3 10*3/uL (ref 0.7–4.0)
MCH: 32.6 pg (ref 26.0–34.0)
MCHC: 33 g/dL (ref 30.0–36.0)
MCV: 98.9 fL (ref 80.0–100.0)
Monocytes Absolute: 0.6 10*3/uL (ref 0.1–1.0)
Monocytes Relative: 7 %
Neutro Abs: 7.1 10*3/uL (ref 1.7–7.7)
Neutrophils Relative %: 77 %
Platelets: 384 10*3/uL (ref 150–400)
RBC: 4.38 MIL/uL (ref 4.22–5.81)
RDW: 16.7 % — ABNORMAL HIGH (ref 11.5–15.5)
WBC: 9.1 10*3/uL (ref 4.0–10.5)
nRBC: 0 % (ref 0.0–0.2)

## 2022-02-22 LAB — MAGNESIUM: Magnesium: 2.3 mg/dL (ref 1.7–2.4)

## 2022-02-22 MED ORDER — SODIUM POLYSTYRENE SULFONATE 15 GM/60ML PO SUSP
15.0000 g | Freq: Once | ORAL | Status: DC
  Filled 2022-02-22 (×2): qty 60

## 2022-02-22 MED ORDER — SODIUM POLYSTYRENE SULFONATE 15 GM/60ML PO SUSP
15.0000 g | Freq: Once | ORAL | Status: AC
  Administered 2022-02-22: 15 g via ORAL
  Filled 2022-02-22 (×2): qty 60

## 2022-02-22 MED ORDER — SODIUM CHLORIDE 0.9% FLUSH
10.0000 mL | INTRAVENOUS | Status: DC | PRN
  Administered 2022-02-22: 10 mL via INTRAVENOUS

## 2022-02-22 MED ORDER — SODIUM POLYSTYRENE SULFONATE 15 GM/60ML PO SUSP
15.0000 g | Freq: Once | ORAL | Status: AC
  Administered 2022-02-22: 15 g via ORAL
  Filled 2022-02-22: qty 60

## 2022-02-22 MED ORDER — HEPARIN SOD (PORK) LOCK FLUSH 100 UNIT/ML IV SOLN
500.0000 [IU] | Freq: Once | INTRAVENOUS | Status: AC
  Administered 2022-02-22: 500 [IU] via INTRAVENOUS

## 2022-02-22 MED ORDER — SODIUM CHLORIDE 0.9 % IV SOLN: INTRAVENOUS | Status: DC

## 2022-02-22 NOTE — Progress Notes (Signed)
Patient presents today for IVF.  Patient is in stable condition with no new complaints voiced.  Vital signs are stable.  Labs reviewed.  Potassium today is 5.6.  Tarri Abernethy, PA-C notified.  We will proceed with 1 L of NS over 2 hours today.  Patient will receive Kayexalate 15g/60 mL x 2 doses to take home per [employer], PA-C.  Per Eugene Garnet, patient should hold second dose of Kayexalate if diarrhea is severe.  Patient and wife informed.    Patient tolerated IVF well with no complaints voiced.  Patient left via wheelchair with wife in stable condition.  Vital signs stable at discharge.  Follow up as scheduled.

## 2022-02-22 NOTE — Patient Instructions (Signed)
[facility]  Discharge Instructions: Thank you for choosing [facility] to provide your oncology and hematology care.  If you have a lab appointment with the Cancer Center, please come in thru the Main Entrance and check in at the main information desk.  Wear comfortable clothing and clothing appropriate for easy access to any Portacath or PICC line.   We strive to give you quality time with your provider. You may need to reschedule your appointment if you arrive late (15 or more minutes).  Arriving late affects you and other patients whose appointments are after yours.  Also, if you miss three or more appointments without notifying the office, you may be dismissed from the clinic at the provider's discretion.      For prescription refill requests, have your pharmacy contact our office and allow 72 hours for refills to be completed.     To help prevent nausea and vomiting after your treatment, we encourage you to take your nausea medication as directed.  BELOW ARE SYMPTOMS THAT SHOULD BE REPORTED IMMEDIATELY: *FEVER GREATER THAN 100.4 F (38 C) OR HIGHER *CHILLS OR SWEATING *NAUSEA AND VOMITING THAT IS NOT CONTROLLED WITH YOUR NAUSEA MEDICATION *UNUSUAL SHORTNESS OF BREATH *UNUSUAL BRUISING OR BLEEDING *URINARY PROBLEMS (pain or burning when urinating, or frequent urination) *BOWEL PROBLEMS (unusual diarrhea, constipation, pain near the anus) TENDERNESS IN MOUTH AND THROAT WITH OR WITHOUT PRESENCE OF ULCERS (sore throat, sores in mouth, or a toothache) UNUSUAL RASH, SWELLING OR PAIN  UNUSUAL VAGINAL DISCHARGE OR ITCHING   Items with * indicate a potential emergency and should be followed up as soon as possible or go to the Emergency Department if any problems should occur.  Please show the CHEMOTHERAPY ALERT CARD or IMMUNOTHERAPY ALERT CARD at check-in to the Emergency Department and triage nurse.  Should you have questions after your visit or need to  cancel or reschedule your appointment, please contact [facility] [phone]  and follow the prompts.  Office hours are 8:00 a.m. to 4:30 p.m. Monday - Friday. Please note that voicemails left after 4:00 p.m. may not be returned until the following business day.  We are closed weekends and major holidays. You have access to a nurse at all times for urgent questions. Please call the main number to the clinic [phone] and follow the prompts.  For any non-urgent questions, you may also contact your provider using MyChart. We now offer e-Visits for anyone 18 and older to request care online for non-urgent symptoms. For details visit [email or web address].   Also download the MyChart app! Go to the app store, search "MyChart", open the app, select [facility], and log in with your MyChart username and password.  Masks are optional in the cancer centers. If you would like for your care team to wear a mask while they are taking care of you, please let them know. You may have one support person who is at least 64 years old accompany you for your appointments.  

## 2022-02-23 LAB — CANCER ANTIGEN 19-9: CA 19-9: 2143 U/mL — ABNORMAL HIGH (ref 0–35)

## 2022-02-25 ENCOUNTER — Inpatient Hospital Stay (HOSPITAL_BASED_OUTPATIENT_CLINIC_OR_DEPARTMENT_OTHER): Payer: BC Managed Care – PPO | Admitting: Hematology

## 2022-02-25 ENCOUNTER — Inpatient Hospital Stay: Payer: BC Managed Care – PPO

## 2022-02-25 ENCOUNTER — Other Ambulatory Visit: Payer: BC Managed Care – PPO

## 2022-02-25 VITALS — BP 91/70 | HR 103 | Temp 97.6°F | Resp 20 | Wt 149.7 lb

## 2022-02-25 DIAGNOSIS — C259 Malignant neoplasm of pancreas, unspecified: Secondary | ICD-10-CM

## 2022-02-25 DIAGNOSIS — M47814 Spondylosis without myelopathy or radiculopathy, thoracic region: Secondary | ICD-10-CM | POA: Diagnosis not present

## 2022-02-25 DIAGNOSIS — N400 Enlarged prostate without lower urinary tract symptoms: Secondary | ICD-10-CM | POA: Diagnosis not present

## 2022-02-25 DIAGNOSIS — I10 Essential (primary) hypertension: Secondary | ICD-10-CM | POA: Diagnosis not present

## 2022-02-25 DIAGNOSIS — N281 Cyst of kidney, acquired: Secondary | ICD-10-CM | POA: Diagnosis not present

## 2022-02-25 DIAGNOSIS — Z86718 Personal history of other venous thrombosis and embolism: Secondary | ICD-10-CM | POA: Diagnosis not present

## 2022-02-25 DIAGNOSIS — K8689 Other specified diseases of pancreas: Secondary | ICD-10-CM | POA: Diagnosis not present

## 2022-02-25 DIAGNOSIS — K831 Obstruction of bile duct: Secondary | ICD-10-CM | POA: Diagnosis not present

## 2022-02-25 DIAGNOSIS — I700 Atherosclerosis of aorta: Secondary | ICD-10-CM | POA: Diagnosis not present

## 2022-02-25 DIAGNOSIS — E119 Type 2 diabetes mellitus without complications: Secondary | ICD-10-CM | POA: Diagnosis not present

## 2022-02-25 DIAGNOSIS — F17290 Nicotine dependence, other tobacco product, uncomplicated: Secondary | ICD-10-CM | POA: Diagnosis not present

## 2022-02-25 DIAGNOSIS — E785 Hyperlipidemia, unspecified: Secondary | ICD-10-CM | POA: Diagnosis not present

## 2022-02-25 DIAGNOSIS — Z79899 Other long term (current) drug therapy: Secondary | ICD-10-CM | POA: Diagnosis not present

## 2022-02-25 DIAGNOSIS — Z79890 Hormone replacement therapy (postmenopausal): Secondary | ICD-10-CM | POA: Diagnosis not present

## 2022-02-25 DIAGNOSIS — Z7901 Long term (current) use of anticoagulants: Secondary | ICD-10-CM | POA: Diagnosis not present

## 2022-02-25 LAB — COMPREHENSIVE METABOLIC PANEL
ALT: 40 U/L (ref 0–44)
AST: 36 U/L (ref 15–41)
Albumin: 3.2 g/dL — ABNORMAL LOW (ref 3.5–5.0)
Alkaline Phosphatase: 164 U/L — ABNORMAL HIGH (ref 38–126)
Anion gap: 9 (ref 5–15)
BUN: 32 mg/dL — ABNORMAL HIGH (ref 8–23)
CO2: 21 mmol/L — ABNORMAL LOW (ref 22–32)
Calcium: 9.2 mg/dL (ref 8.9–10.3)
Chloride: 99 mmol/L (ref 98–111)
Creatinine, Ser: 1.29 mg/dL — ABNORMAL HIGH (ref 0.61–1.24)
GFR, Estimated: >60 mL/min (ref >60)
Glucose, Bld: 178 mg/dL — ABNORMAL HIGH (ref 70–99)
Potassium: 5.1 mmol/L (ref 3.5–5.1)
Sodium: 129 mmol/L — ABNORMAL LOW (ref 135–145)
Total Bilirubin: 3.7 mg/dL — ABNORMAL HIGH (ref 0.3–1.2)
Total Protein: 7.9 g/dL (ref 6.5–8.1)

## 2022-02-25 MED ORDER — SODIUM CHLORIDE 0.9% FLUSH
10.0000 mL | INTRAVENOUS | Status: DC | PRN
  Administered 2022-02-25: 10 mL via INTRAVENOUS

## 2022-02-25 MED ORDER — HEPARIN SOD (PORK) LOCK FLUSH 100 UNIT/ML IV SOLN
500.0000 [IU] | Freq: Once | INTRAVENOUS | Status: AC
  Administered 2022-02-25: 500 [IU] via INTRAVENOUS

## 2022-02-25 MED ORDER — SODIUM CHLORIDE 0.9 % IV SOLN: Freq: Once | INTRAVENOUS | Status: AC

## 2022-02-25 NOTE — Progress Notes (Signed)
Pt presents today for possible IV fluids per provider's order. Vital signs stable. Message received from Dr.K to give 1 liter of normal saline over two hours.  1 Liter of normal saline over 2 hours given today per MD orders. Tolerated infusion without adverse affects. Vital signs stable. No complaints at this time. Discharged from clinic via wheelchair in stable condition. Alert and oriented x 3. F/U with [facility] as scheduled.

## 2022-02-25 NOTE — Patient Instructions (Signed)
[facility]  Discharge Instructions: Thank you for choosing [facility] to provide your oncology and hematology care.  If you have a lab appointment with the [facility], please come in thru the Main Entrance and check in at the main information desk.  Wear comfortable clothing and clothing appropriate for easy access to any Portacath or PICC line.   We strive to give you quality time with your provider. You may need to reschedule your appointment if you arrive late (15 or more minutes).  Arriving late affects you and other patients whose appointments are after yours.  Also, if you miss three or more appointments without notifying the office, you may be dismissed from the clinic at the provider's discretion.      For prescription refill requests, have your pharmacy contact our office and allow 72 hours for refills to be completed.    Today you received the following chemotherapy and/or immunotherapy agents liter of NS.   To help prevent nausea and vomiting after your treatment, we encourage you to take your nausea medication as directed.  BELOW ARE SYMPTOMS THAT SHOULD BE REPORTED IMMEDIATELY: *FEVER GREATER THAN 100.4 F (38 C) OR HIGHER *CHILLS OR SWEATING *NAUSEA AND VOMITING THAT IS NOT CONTROLLED WITH YOUR NAUSEA MEDICATION *UNUSUAL SHORTNESS OF BREATH *UNUSUAL BRUISING OR BLEEDING *URINARY PROBLEMS (pain or burning when urinating, or frequent urination) *BOWEL PROBLEMS (unusual diarrhea, constipation, pain near the anus) TENDERNESS IN MOUTH AND THROAT WITH OR WITHOUT PRESENCE OF ULCERS (sore throat, sores in mouth, or a toothache) UNUSUAL RASH, SWELLING OR PAIN  UNUSUAL VAGINAL DISCHARGE OR ITCHING   Items with * indicate a potential emergency and should be followed up as soon as possible or go to the Emergency Department if any problems should occur.  Please show the CHEMOTHERAPY ALERT CARD or IMMUNOTHERAPY ALERT CARD at check-in to the  Emergency Department and triage nurse.  Should you have questions after your visit or need to cancel or reschedule your appointment, please contact [facility] [phone]  and follow the prompts.  Office hours are 8:00 a.m. to 4:30 p.m. Monday - Friday. Please note that voicemails left after 4:00 p.m. may not be returned until the following business day.  We are closed weekends and major holidays. You have access to a nurse at all times for urgent questions. Please call the main number to the clinic [phone] and follow the prompts.  For any non-urgent questions, you may also contact your provider using MyChart. We now offer e-Visits for anyone 58 and older to request care online for non-urgent symptoms. For details visit [email or web address].   Also download the MyChart app! Go to the app store, search "MyChart", open the app, select [facility], and log in with your MyChart username and password.  Masks are optional in the cancer centers. If you would like for your care team to wear a mask while they are taking care of you, please let them know. You may have one support person who is at least 64 years old accompany you for your appointments.

## 2022-02-25 NOTE — Progress Notes (Signed)
[facility] [address], [place] [address]   CLINIC:  Medical Oncology/Hematology  PCP:  [name], MD [place] / [place] [address] [phone]   REASON FOR VISIT:  Follow-up for pancreatic adenocarcinoma  PRIOR THERAPY: FOLFIRINOX every 2 weeks x 5 cycles Gemcitabine plus capecitabine every 28 days for 4 cycles  NGS Results: K-ras G12 C  CURRENT THERAPY: Sotorasib  BRIEF ONCOLOGIC HISTORY:  Oncology History  Pancreatic adenocarcinoma ([place])  12/04/2020 Initial Diagnosis   Pancreatic adenocarcinoma ([place])   01/10/2021 - 03/16/2021 Chemotherapy   Patient is on Treatment Plan : PANCREAS Modified FOLFIRINOX q14d x 4 cycles      Genetic Testing   [facility] CancerNext-Expanded Panel+RNA was Negative. Of note, a variant of uncertain significance was detected in the BRCA1 gene. Report date is 04/23/2021.  The CancerNext-Expanded gene panel offered by [facility] and includes sequencing, rearrangement, and RNA analysis for the following 77 genes: AIP, ALK, APC, ATM, AXIN2, BAP1, BARD1, BLM, BMPR1A, BRCA1, BRCA2, BRIP1, CDC73, CDH1, CDK4, CDKN1B, CDKN2A, CHEK2, CTNNA1, DICER1, FANCC, FH, FLCN, GALNT12, KIF1B, LZTR1, MAX, MEN1, MET, MLH1, MSH2, MSH3, MSH6, MUTYH, NBN, NF1, NF2, NTHL1, PALB2, PHOX2B, PMS2, POT1, PRKAR1A, PTCH1, PTEN, RAD51C, RAD51D, RB1, RECQL, RET, SDHA, SDHAF2, SDHB, SDHC, SDHD, SMAD4, SMARCA4, SMARCB1, SMARCE1, STK11, SUFU, TMEM127, TP53, TSC1, TSC2, VHL and XRCC2 (sequencing and deletion/duplication); EGFR, EGLN1, HOXB13, KIT, MITF, PDGFRA, POLD1, and POLE (sequencing only); EPCAM and GREM1 (deletion/duplication only).    07/23/2021 -  Chemotherapy   Patient is on Treatment Plan : PANCREAS Gemcitabine D1,8,15 + Capecitabine D1-21 q28d x 6 cycles       CANCER STAGING:  Cancer Staging  Pancreatic adenocarcinoma [facility]) Staging form: Exocrine Pancreas, AJCC 8th Edition - Clinical stage from 12/04/2020: Stage IB (cT2, cN0, cM0)  - Unsigned   INTERVAL HISTORY:  Mr. KHALLID PASILLAS, a 66 y.o. male, seen for follow-up of metastatic pancreatic cancer.  He had a right PTC and internal/external biliary drain placed on 02/04/2022 due to biliary obstruction.  He lost about 20 pounds in the last couple of months.  He is drinking about 1 can of Ensure per day and eating 2 meals per day, small portions.  Denies any nausea or vomiting.  He reports that back pain has gotten worse when his biliary duct was blocked.  He is taking oxycodone 1 tablet later in the afternoon and 1 and half tablet at bedtime.  He reports that everything tastes salty.  REVIEW OF SYSTEMS:  Review of Systems  Constitutional:  Positive for unexpected weight change (-25lb since July).  Musculoskeletal:  Positive for back pain (6/10).  All other systems reviewed and are negative.   PAST MEDICAL/SURGICAL HISTORY:  Past Medical History:  Diagnosis Date   Anemia, iron deficiency    Diabetes mellitus without complication ([place])    on meds   Hearing loss    Bil hearing aids   Hyperlipidemia    Hypertension    Port-A-Cath in place 12/26/2020   Prediabetes    Sleep apnea    Past Surgical History:  Procedure Laterality Date   BILIARY STENT PLACEMENT N/A 11/09/2020   Procedure: BILIARY STENT PLACEMENT - 85 F X 5 CM STENT;  Surgeon: Rogene Houston, MD;  Location: [facility];  Service: Endoscopy;  Laterality: N/A;   BILIARY STENT PLACEMENT N/A 12/27/2020   Procedure: BILIARY STENT PLACEMENT;  Surgeon: Rogene Houston, MD;  Location: [facility];  Service: Endoscopy;  Laterality: N/A;  COLONOSCOPY     ENTEROSCOPY N/A 12/27/2021   Procedure: ENTEROSCOPY;  Surgeon: Mansouraty, Telford Nab., MD;  Location: [facility] END[facility]COPY;  Service: Gastroenterology;  Laterality: N/A;   ERCP N/A 11/09/2020   Procedure: ENDOSCOPIC RETROGRADE CHOLANGIOPANCREATOGRAPHY (ERCP);  Surgeon: Rogene Houston, MD;  Location: [facility];  Service: Endoscopy;  Laterality: N/A;   ERCP N/A 12/27/2020    Procedure: ENDOSCOPIC RETROGRADE CHOLANGIOPANCREATOGRAPHY (ERCP);  Surgeon: Rogene Houston, MD;  Location: [facility];  Service: Endoscopy;  Laterality: N/A;   ESOPHAGOGASTRODUODENOSCOPY (EGD) WITH PROPOFOL N/A 11/30/2020   Procedure: ESOPHAGOGASTRODUODENOSCOPY (EGD) WITH PROPOFOL;  Surgeon: Milus Banister, MD;  Location: [facility] ENDOSCOPY;  Service: Endoscopy;  Laterality: N/A;   EUS N/A 11/30/2020   Procedure: UPPER ENDOSCOPIC ULTRASOUND (EUS) RADIAL;  Surgeon: Milus Banister, MD;  Location: [facility] END[facility];  Service: Endoscopy;  Laterality: N/A;   FINE NEEDLE ASPIRATION N/A 11/30/2020   Procedure: FINE NEEDLE ASPIRATION (FNA) LINEAR;  Surgeon: Milus Banister, MD;  Location: [facility] ENDOS[facility];  Service: Endoscopy;  Laterality: N/A;   GIVENS CAPSULE STUDY     IR BILIARY DRAIN PLACEMENT WITH CHOLANGIOGRAM  02/04/2022   IR IMAGING GUIDED PORT INSERTION  12/18/2020   KNEE SURGERY     4 surgeries on left, 2 on right knee   SHOULDER SURGERY     right shoulder   SPHINCTEROTOMY N/A 11/09/2020   Procedure: SHORT BILIARY SPHINCTEROTOMY;  Surgeon: Rogene Houston, MD;  Location: [facility];  Service: Endoscopy;  Laterality: N/A;   WHIPPLE PROCEDURE      SOCIAL HISTORY:  Social History   Socioeconomic History   Marital status: Married    Spouse name: Not on file   Number of children: Not on file   Years of education: Not on file   Highest education level: Not on file  Occupational History   Not on file  Tobacco Use   Smoking status: Every Day    Types: Cigars   Smokeless tobacco: Never   Tobacco comments:    Smoke 3-4 cigars a day  Vaping Use   Vaping Use: Never used  Substance and Sexual Activity   Alcohol use: Yes    Comment: occasional   Drug use: Never   Sexual activity: Not on file  Other Topics Concern   Not on file  Social History Narrative   Not on file   Social Determinants of Health   Financial Resource Strain: Low Risk  (12/20/2020)   Overall Financial Resource Strain (CARDIA)     Difficulty of Paying Living Expenses: Not hard at all  Food Insecurity: No Food Insecurity (02/02/2022)   Hunger Vital Sign    Worried About Running Out of Food in the Last Year: Never true    [place] in the Last Year: Never true  Transportation Needs: No Transportation Needs (02/02/2022)   PRAPARE - Hydrologist (Medical): No    Lack of Transportation (Non-Medical): No  Physical Activity: Sufficiently Active (12/20/2020)   Exercise Vital Sign    Days of Exercise per Week: 5 days    Minutes of Exercise per Session: 30 min  Recent Concern: Physical Activity - Inactive (11/17/2020)   Exercise Vital Sign    Days of Exercise per Week: 0 days    Minutes of Exercise per Session: 0 min  Stress: Not on file  Social Connections: Not on file  Intimate Partner Violence: Not At Risk (02/02/2022)   Humiliation, Afraid, Rape, and Kick questionnaire  Fear of Current or Ex-Partner: No    Emotionally Abused: No    Physically Abused: No    Sexually Abused: No    FAMILY HISTORY:  Family History  Problem Relation Age of Onset   Heart attack Father    Heart attack Paternal Grandfather    Leukemia Sister    Heart attack Brother     CURRENT MEDICATIONS:  Current Outpatient Medications  Medication Sig Dispense Refill   apixaban (ELIQUIS) 5 MG TABS tablet Take 1 tablet (5 mg total) by mouth 2 (two) times daily. 60 tablet 5   azelastine (ASTELIN) 0.1 % nasal spray Place 2 sprays into both nostrils 2 (two) times daily. (Patient taking differently: Place 2 sprays into both nostrils 2 (two) times daily as needed for allergies.) 30 mL 12   Continuous Blood Gluc Sensor (FREESTYLE LIBRE 3 SENSOR) MISC 1 Piece by Does not apply route every 14 (fourteen) days. Place 1 sensor on the skin every 14 days. Use to check glucose continuously Dx E11.65 6 each 1   insulin detemir (LEVEMIR) 100 UNIT/ML FlexPen Inject 6 Units into the skin at bedtime. 15 mL 0   insulin lispro (HUMALOG  KWIKPEN) 100 UNIT/ML KwikPen Inject 4-7 Units into the skin 3 (three) times daily before meals. 15 mL 1   levothyroxine (SYNTHROID) 175 MCG tablet TAKE 1 TABLET DAILY (Patient taking differently: Take 175 mcg by mouth daily before breakfast.) 90 tablet 1   lipase/protease/amylase (CREON) 36000 UNITS CPEP capsule Take 2 capsules (72,000 Units total) by mouth 3 (three) times daily with meals. May also take 1 capsule (36,000 Units total) as needed (with snacks). (Patient taking differently: Take 1 capsule (36,000 Units total) by mouth 3 (three) times daily with meals. May also take 1 capsule (36,000 Units total) as needed (with snacks).) 240 capsule 11   oxyCODONE (OXY IR/ROXICODONE) 5 MG immediate release tablet Take 1 tablet (5 mg total) by mouth every 4 (four) hours as needed for severe pain. 30 tablet 0   sildenafil (VIAGRA) 100 MG tablet TAKE 1/2 TO 1 TABLET BY MOUTH AS NEEDED FOR ERECTILE DYSFUNCTION (Patient taking differently: Take 50-100 mg by mouth as needed for erectile dysfunction.) 5 tablet 6   sodium chloride flush (NS) 0.9 % SOLN 5-10 mLs by Intracatheter route daily. Flush biliary drain with 5 mL normal saline everyday. 300 mL 0   No current facility-administered medications for this visit.    ALLERGIES:  Allergies  Allergen Reactions   Lisinopril Cough    PHYSICAL EXAM:  Performance status (ECOG): 1 - Symptomatic but completely ambulatory  Vitals:   02/25/22 0856  BP: 91/70  Pulse: (!) 103  Resp: 20  Temp: 97.6 F (36.4 C)  SpO2: 100%   Wt Readings from Last 3 Encounters:  02/25/22 149 lb 11.1 oz (67.9 kg)  02/18/22 159 lb 9.6 oz (72.4 kg)  02/13/22 162 lb 3.2 oz (73.6 kg)   Physical Exam Vitals reviewed.  Constitutional:      Appearance: Normal appearance.  Cardiovascular:     Rate and Rhythm: Normal rate and regular rhythm.     Pulses: Normal pulses.     Heart sounds: Normal heart sounds.  Pulmonary:     Effort: Pulmonary effort is normal.     Breath sounds:  Normal breath sounds.  Neurological:     General: No focal deficit present.     Mental Status: He is alert and oriented to person, place, and time.  Psychiatric:  Mood and Affect: Mood normal.        Behavior: Behavior normal.    LABORATORY DATA:  I have reviewed the labs as listed.     Latest Ref Rng & Units 02/22/2022    8:57 AM 02/21/2022    9:46 AM 02/12/2022   10:27 AM  CBC  WBC 4.0 - 10.5 K/uL 9.1  10.3  6.7   Hemoglobin 13.0 - 17.0 g/dL 14.3  14.2  11.3   Hematocrit 39.0 - 52.0 % 43.3  43.7  33.0   Platelets 150 - 400 K/uL 384  402  544       Latest Ref Rng & Units 02/25/2022    8:05 AM 02/22/2022    8:57 AM 02/21/2022    9:46 AM  CMP  Glucose 70 - 99 mg/dL 178  198  195   BUN 8 - 23 mg/dL 32  36  35   Creatinine 0.61 - 1.24 mg/dL 1.29  1.42  1.45   Sodium 135 - 145 mmol/L 129  133  132   Potassium 3.5 - 5.1 mmol/L 5.1  5.6  5.7   Chloride 98 - 111 mmol/L 99  101  99   CO2 22 - 32 mmol/L _0 Calcium 8.9 - 10.3 mg/dL 9.2  9.3  9.4   Total Protein 6.5 - 8.1 g/dL 7.9  8.3  8.3   Total Bilirubin 0.3 - 1.2 mg/dL 3.7  4.5  4.7   Alkaline Phos 38 - 126 U/L 164  209  224   AST 15 - 41 U/L 36  36  36   ALT 0 - 44 U/L 40  39  42     DIAGNOSTIC IMAGING:  I have independently reviewed the scans and discussed with the patient. NM PET Image Restag (PS) Skull Base To Thigh  Result Date: 02/24/2022 CLINICAL DATA:  Subsequent treatment strategy for pancreatic cancer. EXAM: NUCLEAR MEDICINE PET SKULL BASE TO THIGH TECHNIQUE: 8.49 mCi F-18 FDG was injected intravenously. Full-ring PET imaging was performed from the skull base to thigh after the radiotracer. CT data was obtained and used for attenuation correction and anatomic localization. Fasting blood glucose: 187 mg/dl COMPARISON:  Multiple prior CT scans. The most recent is 01/29/2022 FINDINGS: Mediastinal blood pool activity: SUV max 2.44 Liver activity: SUV max NA NECK: No hypermetabolic lymph nodes in the neck.  Incidental CT findings: Mild uniform hypermetabolism in the thyroid gland, likely reflecting thyroiditis. No thyroid nodules. CHEST: No hypermetabolic mediastinal or hilar nodes. No suspicious pulmonary nodules on the CT scan. Incidental CT findings: Stable emphysematous changes and three-vessel coronary artery calcifications. ABDOMEN/PELVIS: Since the prior CT scan the patient has had a external biliary drainage catheter placed. No persistent intrahepatic biliary dilatation. Small amount of pneumobilia, not unexpected. The catheter is in good position without complicating features. Focal area hypermetabolism is noted in the peripheral aspect of segment 5 of the liver difficult to identify definite hepatic lesion on the CT scan but this is certainly worrisome for a new hepatic metastatic focus. SUV max is 7.53. As demonstrated on the prior CT scan there are peripancreatic nodes demonstrating hypermetabolism. Periportal node has an SUV max of 5.17. The node near the superior mesenteric artery has an SUV max of 5.04. No retroperitoneal adenopathy. The adrenal glands are unremarkable. Incidental CT findings: Stable aortic and iliac artery calcifications. SKELETON: No findings for osseous metastatic disease. Incidental CT findings: None. IMPRESSION: 1. Hypermetabolic focus in segment 5 of  the liver is worrisome for a metastatic lesion. This is difficult to see on the CT scan. 2. Decompression of the biliary system within externalized drainage catheter. No complicating features. 3. Hypermetabolic upper abdominal lymph nodes as above. 4. No findings for metastatic disease involving the chest or bony structures. 5. Stable age advanced vascular disease. Electronically Signed   By: Marijo Sanes M.D.   On: 02/24/2022 10:15   US Abdomen Complete  Result Date: 02/14/2022 CLINICAL DATA:  Pancreatic cancer.  Abdominal pain. EXAM: ABDOMEN ULTRASOUND COMPLETE COMPARISON:  CT scan 01/29/2022 FINDINGS: Gallbladder: Surgically  absent. Common bile duct: Diameter: Biliary drainage catheter in place. Liver: Diffuse increased echogenicity. Mild intrahepatic biliary dilatation. No focal hepatic lesions. Portal vein is patent on color Doppler imaging with normal direction of blood flow towards the liver. IVC: Normal caliber. Pancreas: Not visualized. Spleen: Normal size.  No focal lesions. Right Kidney: Length: 11.0 cm. Normal renal cortical thickness and echogenicity. No hydronephrosis. Small midpole renal cyst. Left Kidney: Length: 11.4 cm. Normal renal cortical thickness and echogenicity without focal lesions or hydronephrosis. Abdominal aorta: Normal caliber Other findings: None. IMPRESSION: 1. Status post cholecystectomy with biliary drainage catheter in place. 2. Diffuse increased echogenicity of the liver most likely due to steatosis. 3. Mild intrahepatic biliary dilatation. 4. Poor visualization of the pancreas. 5. Normal spleen and both kidneys. Electronically Signed   By: Marijo Sanes M.D.   On: 02/14/2022 10:02   IR BILIARY DRAIN PLACEMENT WITH CHOLANGIOGRAM  Result Date: 02/04/2022 INDICATION: 66 year old male with a history of pancreatic cancer status post [place] now with recurrent pancreatic cancer and malignant biliary obstruction of the common bile duct. He presents for placement of a percutaneous biliary drain. EXAM: Percutaneous transhepatic cholangiogram with placement of percutaneous biliary drain MEDICATIONS: Zosyn 3.375 g; The antibiotic was administered within an appropriate time frame prior to the initiation of the procedure. ANESTHESIA/SEDATION: Moderate (conscious) sedation was employed during this procedure. A total of Versed 1 mg and Fentanyl 50 mcg was administered intravenously by the radiology nurse. Total intra-service moderate Sedation Time: 23 minutes. The patient's level of consciousness and vital signs were monitored continuously by radiology nursing throughout the procedure under my direct supervision.  FLUOROSCOPY: Radiation Exposure Index (as provided by the fluoroscopic device): 62 mGy Kerma COMPLICATIONS: None immediate. PROCEDURE: Informed written consent was obtained from the patient after a thorough discussion of the procedural risks, benefits and alternatives. All questions were addressed. Maximal Sterile Barrier Technique was utilized including caps, mask, sterile gowns, sterile gloves, sterile drape, hand hygiene and skin antiseptic. A timeout was performed prior to the initiation of the procedure. The liver was interrogated with ultrasound. There is a massive biliary ductal dilatation. A suitable skin entry site in the right anterior axillary line was selected. Local anesthesia was attained by infiltration with 1% lidocaine. A small dermatotomy was made. Under real-time ultrasound guidance a 21 gauge trocar needle was advanced into a tertiary peripheral biliary radicle. A partial transhepatic cholangiogram was performed confirming placement of the needle within a suitable biliary radicle. A wire was then advanced through the wire into the central biliary system. The transitional dilator was advanced over the wire and into the main bile duct. Additional percutaneous transhepatic cholangiography was performed. Complete occlusion of the common bile duct. A Glidewire was successfully advanced beyond the obstruction and into the small bowel. The glidewire was then exchanged for a superstiff Amplatz wire. The percutaneous transhepatic tract was dilated to 10 [place]. A Cook 10.2 [place] biliary drain  was then advanced over the wire and formed with the locking loop in the duodenum. Images were obtained and stored for the medical record. The drain was connected to gravity bag drainage and secured to the skin with 0 silk suture. IMPRESSION: 1. Successful percutaneous transhepatic cholangiogram with placement of an internal/external biliary drainage catheter. PLAN: 1. Maintain biliary drain to gravity bag in till  there has been a meaningful down trend in serum bilirubin. 2. When serum bilirubin is clearly improving, the drain may be capped. 3. Record drain output and follow serum electrolytes. Hydrate and replete electrolytes as needed. Electronically Signed   By: Jacqulynn Cadet M.D.   On: 02/04/2022 17:03   US Abdomen Limited RUQ (LIVER/GB)  Result Date: 02/01/2022 CLINICAL DATA:  Right upper quadrant pain EXAM: ULTRASOUND ABDOMEN LIMITED RIGHT UPPER QUADRANT COMPARISON:  CT 01/29/2022 FINDINGS: Gallbladder: Surgically absent Common bile duct: Diameter: Dilated common bile duct measuring up to 12 mm. There is hypoechoic material or tissue within the bile duct. This is avascular. Liver: No focal hepatic abnormality. Marked intra hepatic biliary dilatation. The liver is echogenic. Portal vein is patent on color Doppler imaging with normal direction of blood flow towards the liver. Other: None. IMPRESSION: 1. Marked intra and extrahepatic biliary dilatation with hypoechoic debris/material versus hypovascular tissue in the common bile duct. 2. Echogenic liver parenchyma consistent with hepatic steatosis and or hepatocellular disease. Electronically Signed   By: Donavan Foil M.D.   On: 02/01/2022 20:47   CT CHEST W CONTRAST  Result Date: 01/30/2022 CLINICAL DATA:  Recurrent pancreatic cancer with acute increase in liver function studies. Epigastric pain and distension for 3 days. * Tracking Code: BO * EXAM: CT CHEST WITH CONTRAST CT ABDOMEN AND PELVIS WITH AND WITHOUT CONTRAST TECHNIQUE: Multidetector CT imaging of the chest was performed during intravenous contrast administration. Multidetector CT imaging of the abdomen and pelvis was performed following the standard protocol before and during bolus administration of intravenous contrast. RADIATION DOSE REDUCTION: This exam was performed according to the departmental dose-optimization program which includes automated exposure control, adjustment of the mA and/or kV  according to patient size and/or use of iterative reconstruction technique. CONTRAST:  199m OMNIPAQUE IOHEXOL 350 MG/ML SOLN COMPARISON:  Prior CT 11/08/2021 and 07/03/2021. FINDINGS: CT CHEST FINDINGS Cardiovascular: No acute vascular findings. Right IJ Port-A-Cath extends to the superior cavoatrial junction. Atherosclerosis of the aorta, great vessels and coronary arteries with stable mild dilatation of the ascending aorta to approximately 4.1 cm. The heart size is normal. There is no pericardial effusion. Mediastinum/Nodes: There are no enlarged mediastinal, hilar or axillary lymph nodes. The thyroid gland, trachea and esophagus demonstrate no significant findings. Lungs/Pleura: No pleural effusion or pneumothorax. Mild centrilobular emphysema. New patchy ground-glass opacities posteriorly in the left upper lobe (image 33/3), likely inflammatory. New minimal clustered nodularity in the right middle lobe (image 92/3), also likely inflammatory. Stable perifissural nodularity along the minor fissure on image 91/3. No suspicious pulmonary nodularity. Musculoskeletal/Chest wall: No chest wall mass or suspicious osseous findings. Mild thoracic spondylosis. CT ABDOMEN AND PELVIS FINDINGS Hepatobiliary: New diffuse moderate intrahepatic biliary dilatation with resolution of previously demonstrated pneumobilia, highly suspicious for recurrent biliary obstruction. Status post cholecystectomy and choledocho jejunostomy. No peripheral liver lesions are identified. Pancreas: Status post pancreatic head resection and pancreaticojejunostomy. Enlarging hypodense mass in the pancreatic body, measuring approximately 1.7 x 1.6 cm on image 42/9 (previously 1.1 x 1.0 cm). Increasing main pancreatic ductal dilatation to 8 mm. Enlarging probable hypodense lymph node between the  portal vein and superior mesenteric artery, measuring 2.3 x 1.5 cm on image 39/7, likely causing the biliary obstruction. Spleen: Normal in size without focal  abnormality. Adrenals/Urinary Tract: Both adrenal glands appear normal. No evidence of urinary tract calculus, suspicious renal lesion or hydronephrosis. Stable tiny renal cysts which do not require any imaging follow-up. The bladder appears unremarkable for its degree of distention. Stomach/Bowel: Enteric contrast was administered and has passed into the distal small bowel. There is a lenticular shaped submucosal fluid collection involving the posterior wall of the proximal stomach, measuring up to 3.8 cm on coronal image 50/13. This appears separate from the pancreas. The stomach otherwise appears unremarkable for its degree of distention. The gastrojejunostomy is patent. No evidence of bowel distension, wall thickening or surrounding inflammation. Vascular/Lymphatic: As above, enlarging ill-defined portacaval mass causing probable distal biliary obstruction and vascular encasement. There is progressive narrowing of the superior mesenteric vein just inferior to the portal confluence. There is progressive in case mint of the proximal superior mesenteric artery. No enlarged lymph nodes identified in the lower abdomen or pelvis. Diffuse aortic and branch vessel atherosclerosis. Reproductive: Stable mild enlargement of the prostate gland. Other: No ascites or peritoneal nodularity. Musculoskeletal: No acute or significant osseous findings. Mild spondylosis. IMPRESSION: 1. Enlarging hypoenhancing masses at the pancreatico-jejunal anastomosis and portocaval space consistent with progressive local tumor recurrence. There is resulting high-grade biliary obstruction at the choledocho jejunostomy. In addition, there is progressive pancreatic ductal dilatation with a submucosal fluid collection in the posterior wall of the stomach which may reflect a small pseudocyst. 2. The masses in the porta hepatis cause progressive encasement and narrowing of the superior mesenteric artery and vein. 3. No distant metastases or  peritoneal nodularity identified. 4. No findings highly suspicious for thoracic metastatic disease. Patchy ground-glass and clustered pulmonary nodularity is likely inflammatory. 5. Patient may benefit from percutaneous biliary decompression. 6. These results will be called to the ordering clinician or representative by the Radiologist Assistant, and communication documented in the PACS or [employer]. Electronically Signed   By: Richardean Sale M.D.   On: 01/30/2022 17:09   CT ABDOMEN PELVIS W WO CONTRAST  Result Date: 01/30/2022 CLINICAL DATA:  Recurrent pancreatic cancer with acute increase in liver function studies. Epigastric pain and distension for 3 days. * Tracking Code: BO * EXAM: CT CHEST WITH CONTRAST CT ABDOMEN AND PELVIS WITH AND WITHOUT CONTRAST TECHNIQUE: Multidetector CT imaging of the chest was performed during intravenous contrast administration. Multidetector CT imaging of the abdomen and pelvis was performed following the standard protocol before and during bolus administration of intravenous contrast. RADIATION DOSE REDUCTION: This exam was performed according to the departmental dose-optimization program which includes automated exposure control, adjustment of the mA and/or kV according to patient size and/or use of iterative reconstruction technique. CONTRAST:  139m OMNIPAQUE IOHEXOL 350 MG/ML SOLN COMPARISON:  Prior CT 11/08/2021 and 07/03/2021. FINDINGS: CT CHEST FINDINGS Cardiovascular: No acute vascular findings. Right IJ Port-A-Cath extends to the superior cavoatrial junction. Atherosclerosis of the aorta, great vessels and coronary arteries with stable mild dilatation of the ascending aorta to approximately 4.1 cm. The heart size is normal. There is no pericardial effusion. Mediastinum/Nodes: There are no enlarged mediastinal, hilar or axillary lymph nodes. The thyroid gland, trachea and esophagus demonstrate no significant findings. Lungs/Pleura: No pleural effusion or  pneumothorax. Mild centrilobular emphysema. New patchy ground-glass opacities posteriorly in the left upper lobe (image 33/3), likely inflammatory. New minimal clustered nodularity in the right middle lobe (image  92/3), also likely inflammatory. Stable perifissural nodularity along the minor fissure on image 91/3. No suspicious pulmonary nodularity. Musculoskeletal/Chest wall: No chest wall mass or suspicious osseous findings. Mild thoracic spondylosis. CT ABDOMEN AND PELVIS FINDINGS Hepatobiliary: New diffuse moderate intrahepatic biliary dilatation with resolution of previously demonstrated pneumobilia, highly suspicious for recurrent biliary obstruction. Status post cholecystectomy and choledocho jejunostomy. No peripheral liver lesions are identified. Pancreas: Status post pancreatic head resection and pancreaticojejunostomy. Enlarging hypodense mass in the pancreatic body, measuring approximately 1.7 x 1.6 cm on image 42/9 (previously 1.1 x 1.0 cm). Increasing main pancreatic ductal dilatation to 8 mm. Enlarging probable hypodense lymph node between the portal vein and superior mesenteric artery, measuring 2.3 x 1.5 cm on image 39/7, likely causing the biliary obstruction. Spleen: Normal in size without focal abnormality. Adrenals/Urinary Tract: Both adrenal glands appear normal. No evidence of urinary tract calculus, suspicious renal lesion or hydronephrosis. Stable tiny renal cysts which do not require any imaging follow-up. The bladder appears unremarkable for its degree of distention. Stomach/Bowel: Enteric contrast was administered and has passed into the distal small bowel. There is a lenticular shaped submucosal fluid collection involving the posterior wall of the proximal stomach, measuring up to 3.8 cm on coronal image 50/13. This appears separate from the pancreas. The stomach otherwise appears unremarkable for its degree of distention. The gastrojejunostomy is patent. No evidence of bowel distension,  wall thickening or surrounding inflammation. Vascular/Lymphatic: As above, enlarging ill-defined portacaval mass causing probable distal biliary obstruction and vascular encasement. There is progressive narrowing of the superior mesenteric vein just inferior to the portal confluence. There is progressive in case mint of the proximal superior mesenteric artery. No enlarged lymph nodes identified in the lower abdomen or pelvis. Diffuse aortic and branch vessel atherosclerosis. Reproductive: Stable mild enlargement of the prostate gland. Other: No ascites or peritoneal nodularity. Musculoskeletal: No acute or significant osseous findings. Mild spondylosis. IMPRESSION: 1. Enlarging hypoenhancing masses at the pancreatico-jejunal anastomosis and portocaval space consistent with progressive local tumor recurrence. There is resulting high-grade biliary obstruction at the choledocho jejunostomy. In addition, there is progressive pancreatic ductal dilatation with a submucosal fluid collection in the posterior wall of the stomach which may reflect a small pseudocyst. 2. The masses in the porta hepatis cause progressive encasement and narrowing of the superior mesenteric artery and vein. 3. No distant metastases or peritoneal nodularity identified. 4. No findings highly suspicious for thoracic metastatic disease. Patchy ground-glass and clustered pulmonary nodularity is likely inflammatory. 5. Patient may benefit from percutaneous biliary decompression. 6. These results will be called to the ordering clinician or representative by the Radiologist Assistant, and communication documented in the PACS or [employer]. Electronically Signed   By: Richardean Sale M.D.   On: 01/30/2022 17:09     ASSESSMENT:  1.  Stage I (T2N0) pancreatic adenocarcinoma: - Recent admission to the hospital with DKA, found to have elevated liver enzymes. - MRCP without contrast on 11/08/2020 showed 2.3 x 2 cm central pancreatic head and uncinate  mass with intra and extrahepatic biliary ductal dilation. - 11/09/2020-ERCP sphincterotomy and stenting with findings showing short high-grade distal CBD stricture with dilated CBD up to 14 mm.  Bile duct brushing was negative for malignant cells. - 11/10/2020-CA 19.9-367. - ERCP/EUS by [name] on 11/30/2020 with 2.9 cm mass in the head of the pancreas with unclear outer borders.  Given significant dilatation, indistinct borders, unable to confidently assess major vascular involvement.  Abutment and probable invasion of SMV and portal vein  near the confluence with splenic vein.  No obvious SMA or celiac artery involvement.  No other adenopathy. - FNA of the pancreatic mass consistent with adenocarcinoma. - Weight loss intentional 25 pounds between January and April 1.  Additional 25 pound weight loss since 1 April, unintentional. - CT AP pancreatic protocol on 12/05/2020 with ill-defined mass in the pancreatic head and uncinate process measuring up to 1.9 x 2.1 cm with associated pancreatic ductal dilatation up to 8 mm.  No enlarged adenopathy.  Teardrop morphology of SMV indicative of SMV involvement.  Replaced right hepatic arterial supply arises from the SMA passing immediately posterior to the tumor. - PET scan on 12/14/2020 with mass in the pancreatic head with hypermetabolic features, SUV 5.3 with no sign of metastatic disease. - 5 cycles of neoadjuvant FOLFIRINOX from 01/10/2021 through 03/14/2021. - Whipple's procedure with IRE by Dr.Ianniti on 05/15/2021. - Pathology consistent with grade 2 moderate adenocarcinoma, 3.7 cm, absent treatment effect, no evidence of tumor regression, margins positive, 6/36 nodes positive, YPT2YPN2. - Adjuvant chemotherapy for 4 months with gemcitabine +capecitabine started on 07/23/2021.  This will be followed by chemoradiation therapy with Xeloda for positive margins (R1 resection). - 4 cycles of gemcitabine and capecitabine from 07/23/2021 through 10/16/2021 with  progression - 02/04/2022: Right PTC and placement of internal/external biliary drain - PET scan (02/21/2022): Hypermetabolic focus in segment 5 of the liver.  Hypermetabolic upper abdominal lymph node/pancreatic recurrence.  No evidence of metastatic disease in the chest or bony structures. - NGS test: K-ras G12 C mutation present.  MSI-stable and TMB-low. - Germline mutation testing: BRCA1 VUS   2.  Social/family history: - He lives at home with his wife and is independent of all ADLs and IADLs. - He currently works in [employer].  No history of chemical exposure. - He previously smoked cigarettes.  Currently smokes cigars 4/day. - Maternal grandmother died at age 74 with malignancy, unknown type. - Sister died of acute leukemia.   PLAN:  1.  YpT2pN2 pancreatic adenocarcinoma: - I have reviewed PET scan images with the patient and his wife. - I have reviewed NGS test results which showed K-ras G12 C mutation.  No other targetable mutations identified.  MSI-stable and TMB-low. - I have reached out to Dr. Jimmy Footman at [facility]  No clinical trials open at this time.  However there will be a trial that will be open if his tumor expresses claudin18.2. - We will order claudin 18.2 testing on his NGS. - We will also look into other clinical trial options.  If none found, we will proceed with adagrasib/sotorasib. - Creatinine today is 1.29 and sodium 129.  He will receive 1 L of normal saline.  2.  Weight loss: - He is eating 2 meals per day, decrease portions.  He is drinking 1 shield per day. - He has lost 25 pounds in the last 4 to 6 weeks.  3.  Hypomagnesemia: - Continue magnesium 1 tablet daily.  4.  Pancreatic insufficiency: - Continue Creon 2 capsules with meals and 1 capsule with snack.  5.  Bilateral popliteal DVT (Doppler on 11/23/2021): - Continue Eliquis indefinitely.  No bleeding issues.  6.  Back pain: - This has gotten worse in September. - Continue oxycodone 5  mg 1 tablet in the late afternoon and 1 and half tablet at bedtime which is controlling the pain.   Orders placed this encounter:  No orders of the defined types were placed in this encounter.  Derek Jack, MD [place] [phone]

## 2022-02-25 NOTE — Patient Instructions (Addendum)
[place] at [facility] Discharge Instructions   You were seen and examined today by [name].  He reviewed the results of your PET scan. It shows a lesion on your liver that is suspicious for metastatic disease. He discussed treatment options of an upcoming clinical trial. He does not think you are strong enough for chemotherapy at this time. The special testing we sent on your biopsy shows you have a mutation in the cancer that would benefit from treatment with a pill, but it is not approved in treatment pancreatic cancer. He is in contact with a doctor at [facility]. We will keep you updated on this information.   Return as scheduled.      Thank you for choosing [place] at [facility] to provide your oncology and hematology care.  To afford each patient quality time with our provider, please arrive at least 15 minutes before your scheduled appointment time.   If you have a lab appointment with the [facility] please come in thru the Main Entrance and check in at the main information desk.  You need to re-schedule your appointment should you arrive 10 or more minutes late.  We strive to give you quality time with our providers, and arriving late affects you and other patients whose appointments are after yours.  Also, if you no show three or more times for appointments you may be dismissed from the clinic at the providers discretion.     Again, thank you for choosing [facility].  Our hope is that these requests will decrease the amount of time that you wait before being seen by our physicians.       _____________________________________________________________  Should you have questions after your visit to [facility], please contact our office at [phone] and follow the prompts.  Our office hours are 8:00 a.m. and 4:30 p.m. Monday - Friday.  Please note that voicemails left after 4:00 p.m. may not be  returned until the following business day.  We are closed weekends and major holidays.  You do have access to a nurse 24-7, just call the main number to the clinic [phone] and do not press any options, hold on the line and a nurse will answer the phone.    For prescription refill requests, have your pharmacy contact our office and allow 72 hours.    Due to Covid, you will need to wear a mask upon entering the hospital. If you do not have a mask, a mask will be given to you at the Main Entrance upon arrival. For doctor visits, patients may have 1 support person age 6 or older with them. For treatment visits, patients can not have anyone with them due to social distancing guidelines and our immunocompromised population.

## 2022-02-28 ENCOUNTER — Telehealth (HOSPITAL_COMMUNITY): Payer: Self-pay | Admitting: Student

## 2022-02-28 DIAGNOSIS — C258 Malignant neoplasm of overlapping sites of pancreas: Secondary | ICD-10-CM | POA: Diagnosis not present

## 2022-02-28 NOTE — Telephone Encounter (Signed)
Patient's daughter Vaughan Basta called IR today to discuss the patient's upcoming visit 11/13 in IR for a biliary drain exchange. Vaughan Basta states that the cancer surgeon in [place] is requesting IR attempt placement of a bare metal stent in the hopes of getting the drain removed. The drain is uncomfortable for the patient. I advised Vaughan Basta to call the Cancer doctor in [place] and request that they submit an order to IR for our team to evaluate the patient for this request.   Soyla Dryer, AGACNP-BC [phone] 02/28/2022, 12:15 PM

## 2022-03-04 ENCOUNTER — Encounter (HOSPITAL_COMMUNITY): Payer: Self-pay | Admitting: *Deleted

## 2022-03-04 ENCOUNTER — Other Ambulatory Visit: Payer: Self-pay

## 2022-03-04 ENCOUNTER — Inpatient Hospital Stay: Payer: BC Managed Care – PPO

## 2022-03-04 ENCOUNTER — Telehealth: Payer: Self-pay | Admitting: Family Medicine

## 2022-03-04 ENCOUNTER — Inpatient Hospital Stay (HOSPITAL_COMMUNITY)
Admission: EM | Admit: 2022-03-04 | Discharge: 2022-03-07 | DRG: 682 | Disposition: A | Discharge status: Home / Self Care | Payer: BC Managed Care – PPO | Attending: Family Medicine | Admitting: Family Medicine

## 2022-03-04 ENCOUNTER — Inpatient Hospital Stay (HOSPITAL_COMMUNITY): Payer: BC Managed Care – PPO

## 2022-03-04 ENCOUNTER — Other Ambulatory Visit: Payer: Self-pay | Admitting: Family Medicine

## 2022-03-04 ENCOUNTER — Telehealth: Payer: Self-pay | Admitting: *Deleted

## 2022-03-04 DIAGNOSIS — E785 Hyperlipidemia, unspecified: Secondary | ICD-10-CM | POA: Diagnosis present

## 2022-03-04 DIAGNOSIS — H9190 Unspecified hearing loss, unspecified ear: Secondary | ICD-10-CM | POA: Diagnosis present

## 2022-03-04 DIAGNOSIS — G4730 Sleep apnea, unspecified: Secondary | ICD-10-CM | POA: Diagnosis present

## 2022-03-04 DIAGNOSIS — G8929 Other chronic pain: Secondary | ICD-10-CM | POA: Diagnosis present

## 2022-03-04 DIAGNOSIS — E875 Hyperkalemia: Secondary | ICD-10-CM

## 2022-03-04 DIAGNOSIS — Z681 Body mass index (BMI) 19 or less, adult: Secondary | ICD-10-CM | POA: Diagnosis not present

## 2022-03-04 DIAGNOSIS — I824Y3 Acute embolism and thrombosis of unspecified deep veins of proximal lower extremity, bilateral: Secondary | ICD-10-CM

## 2022-03-04 DIAGNOSIS — E1122 Type 2 diabetes mellitus with diabetic chronic kidney disease: Secondary | ICD-10-CM | POA: Diagnosis not present

## 2022-03-04 DIAGNOSIS — Z90411 Acquired partial absence of pancreas: Secondary | ICD-10-CM

## 2022-03-04 DIAGNOSIS — I129 Hypertensive chronic kidney disease with stage 1 through stage 4 chronic kidney disease, or unspecified chronic kidney disease: Secondary | ICD-10-CM | POA: Diagnosis present

## 2022-03-04 DIAGNOSIS — N179 Acute kidney failure, unspecified: Principal | ICD-10-CM | POA: Diagnosis present

## 2022-03-04 DIAGNOSIS — E871 Hypo-osmolality and hyponatremia: Secondary | ICD-10-CM | POA: Diagnosis not present

## 2022-03-04 DIAGNOSIS — K8681 Exocrine pancreatic insufficiency: Secondary | ICD-10-CM | POA: Diagnosis present

## 2022-03-04 DIAGNOSIS — K8689 Other specified diseases of pancreas: Secondary | ICD-10-CM | POA: Diagnosis not present

## 2022-03-04 DIAGNOSIS — K831 Obstruction of bile duct: Secondary | ICD-10-CM | POA: Diagnosis not present

## 2022-03-04 DIAGNOSIS — N1831 Chronic kidney disease, stage 3a: Secondary | ICD-10-CM | POA: Diagnosis not present

## 2022-03-04 DIAGNOSIS — E860 Dehydration: Secondary | ICD-10-CM | POA: Diagnosis not present

## 2022-03-04 DIAGNOSIS — Z8249 Family history of ischemic heart disease and other diseases of the circulatory system: Secondary | ICD-10-CM

## 2022-03-04 DIAGNOSIS — Z1152 Encounter for screening for COVID-19: Secondary | ICD-10-CM

## 2022-03-04 DIAGNOSIS — Z9221 Personal history of antineoplastic chemotherapy: Secondary | ICD-10-CM

## 2022-03-04 DIAGNOSIS — C259 Malignant neoplasm of pancreas, unspecified: Secondary | ICD-10-CM | POA: Diagnosis not present

## 2022-03-04 DIAGNOSIS — M549 Dorsalgia, unspecified: Secondary | ICD-10-CM | POA: Diagnosis present

## 2022-03-04 DIAGNOSIS — Z79899 Other long term (current) drug therapy: Secondary | ICD-10-CM

## 2022-03-04 DIAGNOSIS — E8721 Acute metabolic acidosis: Secondary | ICD-10-CM | POA: Diagnosis not present

## 2022-03-04 DIAGNOSIS — Z515 Encounter for palliative care: Secondary | ICD-10-CM | POA: Diagnosis not present

## 2022-03-04 DIAGNOSIS — I959 Hypotension, unspecified: Secondary | ICD-10-CM | POA: Diagnosis present

## 2022-03-04 DIAGNOSIS — R627 Adult failure to thrive: Secondary | ICD-10-CM | POA: Diagnosis not present

## 2022-03-04 DIAGNOSIS — E861 Hypovolemia: Secondary | ICD-10-CM | POA: Diagnosis not present

## 2022-03-04 DIAGNOSIS — E089 Diabetes mellitus due to underlying condition without complications: Secondary | ICD-10-CM | POA: Diagnosis present

## 2022-03-04 DIAGNOSIS — Z7189 Other specified counseling: Secondary | ICD-10-CM | POA: Diagnosis not present

## 2022-03-04 DIAGNOSIS — F17290 Nicotine dependence, other tobacco product, uncomplicated: Secondary | ICD-10-CM | POA: Diagnosis present

## 2022-03-04 DIAGNOSIS — E43 Unspecified severe protein-calorie malnutrition: Secondary | ICD-10-CM | POA: Diagnosis present

## 2022-03-04 DIAGNOSIS — G4733 Obstructive sleep apnea (adult) (pediatric): Secondary | ICD-10-CM | POA: Diagnosis not present

## 2022-03-04 DIAGNOSIS — Z66 Do not resuscitate: Secondary | ICD-10-CM | POA: Diagnosis present

## 2022-03-04 DIAGNOSIS — Z794 Long term (current) use of insulin: Secondary | ICD-10-CM

## 2022-03-04 DIAGNOSIS — I9589 Other hypotension: Secondary | ICD-10-CM

## 2022-03-04 DIAGNOSIS — Z79890 Hormone replacement therapy (postmenopausal): Secondary | ICD-10-CM

## 2022-03-04 DIAGNOSIS — Z7901 Long term (current) use of anticoagulants: Secondary | ICD-10-CM

## 2022-03-04 DIAGNOSIS — Z888 Allergy status to other drugs, medicaments and biological substances status: Secondary | ICD-10-CM

## 2022-03-04 DIAGNOSIS — D490 Neoplasm of unspecified behavior of digestive system: Secondary | ICD-10-CM

## 2022-03-04 DIAGNOSIS — I82409 Acute embolism and thrombosis of unspecified deep veins of unspecified lower extremity: Secondary | ICD-10-CM

## 2022-03-04 DIAGNOSIS — Z86718 Personal history of other venous thrombosis and embolism: Secondary | ICD-10-CM

## 2022-03-04 DIAGNOSIS — Z95828 Presence of other vascular implants and grafts: Secondary | ICD-10-CM

## 2022-03-04 DIAGNOSIS — K5900 Constipation, unspecified: Secondary | ICD-10-CM | POA: Diagnosis present

## 2022-03-04 LAB — URINALYSIS, ROUTINE W REFLEX MICROSCOPIC
Bilirubin Urine: NEGATIVE
Glucose, UA: NEGATIVE mg/dL
Ketones, ur: NEGATIVE mg/dL
Nitrite: NEGATIVE
Protein, ur: 30 mg/dL — AB
Specific Gravity, Urine: 1.012 (ref 1.005–1.030)
pH: 5 (ref 5.0–8.0)

## 2022-03-04 LAB — COMPREHENSIVE METABOLIC PANEL
ALT: 44 U/L (ref 0–44)
AST: 27 U/L (ref 15–41)
Albumin: 3.4 g/dL — ABNORMAL LOW (ref 3.5–5.0)
Alkaline Phosphatase: 122 U/L (ref 38–126)
Anion gap: 12 (ref 5–15)
BUN: 78 mg/dL — ABNORMAL HIGH (ref 8–23)
CO2: 21 mmol/L — ABNORMAL LOW (ref 22–32)
Calcium: 9.4 mg/dL (ref 8.9–10.3)
Chloride: 87 mmol/L — ABNORMAL LOW (ref 98–111)
Creatinine, Ser: 4.5 mg/dL — ABNORMAL HIGH (ref 0.61–1.24)
GFR, Estimated: 14 mL/min — ABNORMAL LOW (ref >60)
Glucose, Bld: 115 mg/dL — ABNORMAL HIGH (ref 70–99)
Potassium: 6.8 mmol/L (ref 3.5–5.1)
Sodium: 120 mmol/L — ABNORMAL LOW (ref 135–145)
Total Bilirubin: 3.1 mg/dL — ABNORMAL HIGH (ref 0.3–1.2)
Total Protein: 8.1 g/dL (ref 6.5–8.1)

## 2022-03-04 LAB — CBC WITH DIFFERENTIAL/PLATELET
Abs Immature Granulocytes: 0.04 10*3/uL (ref 0.00–0.07)
Basophils Absolute: 0 10*3/uL (ref 0.0–0.1)
Basophils Relative: 0 %
Eosinophils Absolute: 0.1 10*3/uL (ref 0.0–0.5)
Eosinophils Relative: 0 %
HCT: 37.7 % — ABNORMAL LOW (ref 39.0–52.0)
Hemoglobin: 13 g/dL (ref 13.0–17.0)
Immature Granulocytes: 0 %
Lymphocytes Relative: 10 %
Lymphs Abs: 1.2 10*3/uL (ref 0.7–4.0)
MCH: 32.2 pg (ref 26.0–34.0)
MCHC: 34.5 g/dL (ref 30.0–36.0)
MCV: 93.3 fL (ref 80.0–100.0)
Monocytes Absolute: 1.5 10*3/uL — ABNORMAL HIGH (ref 0.1–1.0)
Monocytes Relative: 13 %
Neutro Abs: 9.1 10*3/uL — ABNORMAL HIGH (ref 1.7–7.7)
Neutrophils Relative %: 77 %
Platelets: 323 10*3/uL (ref 150–400)
RBC: 4.04 MIL/uL — ABNORMAL LOW (ref 4.22–5.81)
RDW: 15.8 % — ABNORMAL HIGH (ref 11.5–15.5)
WBC: 11.9 10*3/uL — ABNORMAL HIGH (ref 4.0–10.5)
nRBC: 0 % (ref 0.0–0.2)

## 2022-03-04 LAB — SARS CORONAVIRUS 2 BY RT PCR: SARS Coronavirus 2 by RT PCR: NEGATIVE

## 2022-03-04 LAB — TROPONIN I (HIGH SENSITIVITY)
Troponin I (High Sensitivity): 6 ng/L (ref <18)
Troponin I (High Sensitivity): 5 ng/L (ref <18)

## 2022-03-04 LAB — CBG MONITORING, ED: Glucose-Capillary: 156 mg/dL — ABNORMAL HIGH (ref 70–99)

## 2022-03-04 LAB — POTASSIUM: Potassium: 5.6 mmol/L — ABNORMAL HIGH (ref 3.5–5.1)

## 2022-03-04 LAB — SODIUM, URINE, RANDOM: Sodium, Ur: <10 mmol/L

## 2022-03-04 MED ORDER — ACETAMINOPHEN 325 MG PO TABS: 650.0000 mg | ORAL_TABLET | Freq: Four times a day (QID) | ORAL | Status: DC | PRN

## 2022-03-04 MED ORDER — SODIUM CHLORIDE 0.9 % IV BOLUS
500.0000 mL | Freq: Once | INTRAVENOUS | Status: AC
  Administered 2022-03-04: 500 mL via INTRAVENOUS

## 2022-03-04 MED ORDER — ALBUTEROL SULFATE (2.5 MG/3ML) 0.083% IN NEBU
10.0000 mg | INHALATION_SOLUTION | Freq: Once | RESPIRATORY_TRACT | Status: AC
  Administered 2022-03-04: 10 mg via RESPIRATORY_TRACT
  Filled 2022-03-04: qty 12

## 2022-03-04 MED ORDER — ONDANSETRON HCL 4 MG PO TABS: 4.0000 mg | ORAL_TABLET | Freq: Four times a day (QID) | ORAL | Status: DC | PRN

## 2022-03-04 MED ORDER — DEXTROSE 50 % IV SOLN
1.0000 | Freq: Once | INTRAVENOUS | Status: AC
  Administered 2022-03-04: 50 mL via INTRAVENOUS
  Filled 2022-03-04: qty 50

## 2022-03-04 MED ORDER — INSULIN ASPART 100 UNIT/ML IJ SOLN
0.0000 [IU] | Freq: Every day | INTRAMUSCULAR | Status: DC
  Filled 2022-03-04: qty 1

## 2022-03-04 MED ORDER — CALCIUM GLUCONATE-NACL 1-0.675 GM/50ML-% IV SOLN
1.0000 g | Freq: Once | INTRAVENOUS | Status: AC
  Administered 2022-03-04: 1000 mg via INTRAVENOUS
  Filled 2022-03-04: qty 50

## 2022-03-04 MED ORDER — HEPARIN SODIUM (PORCINE) 5000 UNIT/ML IJ SOLN: 5000.0000 [IU] | Freq: Three times a day (TID) | INTRAMUSCULAR | Status: DC

## 2022-03-04 MED ORDER — SODIUM CHLORIDE 0.9 % IV BOLUS
2000.0000 mL | Freq: Once | INTRAVENOUS | Status: AC
  Administered 2022-03-04: 2000 mL via INTRAVENOUS

## 2022-03-04 MED ORDER — ONDANSETRON HCL 4 MG/2ML IJ SOLN: 4.0000 mg | Freq: Four times a day (QID) | INTRAMUSCULAR | Status: DC | PRN

## 2022-03-04 MED ORDER — POLYETHYLENE GLYCOL 3350 17 G PO PACK: 17.0000 g | PACK | Freq: Every day | ORAL | Status: DC | PRN

## 2022-03-04 MED ORDER — LEVOTHYROXINE SODIUM 75 MCG PO TABS
175.0000 ug | ORAL_TABLET | Freq: Every day | ORAL | Status: DC
  Administered 2022-03-05 – 2022-03-07 (×3): 175 ug via ORAL
  Filled 2022-03-04 (×3): qty 1

## 2022-03-04 MED ORDER — SENNOSIDES-DOCUSATE SODIUM 8.6-50 MG PO TABS
1.0000 | ORAL_TABLET | Freq: Every day | ORAL | Status: DC
  Administered 2022-03-05: 1 via ORAL
  Filled 2022-03-04: qty 1

## 2022-03-04 MED ORDER — SODIUM CHLORIDE 0.9 % IV SOLN: INTRAVENOUS | Status: DC

## 2022-03-04 MED ORDER — PANCRELIPASE (LIP-PROT-AMYL) 12000-38000 UNITS PO CPEP
36000.0000 [IU] | ORAL_CAPSULE | Freq: Three times a day (TID) | ORAL | Status: DC
  Administered 2022-03-05 – 2022-03-07 (×8): 36000 [IU] via ORAL
  Filled 2022-03-04 (×2): qty 1
  Filled 2022-03-04 (×3): qty 3
  Filled 2022-03-04: qty 1
  Filled 2022-03-04 (×2): qty 3

## 2022-03-04 MED ORDER — POLYETHYLENE GLYCOL 3350 17 G PO PACK
17.0000 g | PACK | Freq: Two times a day (BID) | ORAL | Status: DC
  Administered 2022-03-05 – 2022-03-07 (×4): 17 g via ORAL
  Filled 2022-03-04 (×5): qty 1

## 2022-03-04 MED ORDER — APIXABAN 5 MG PO TABS
5.0000 mg | ORAL_TABLET | Freq: Two times a day (BID) | ORAL | Status: DC
  Administered 2022-03-05 (×2): 5 mg via ORAL
  Filled 2022-03-04 (×2): qty 1

## 2022-03-04 MED ORDER — OXYCODONE HCL 5 MG PO TABS: 5.0000 mg | ORAL_TABLET | ORAL | 0 refills | Status: DC | PRN

## 2022-03-04 MED ORDER — SODIUM ZIRCONIUM CYCLOSILICATE 5 G PO PACK
10.0000 g | PACK | Freq: Once | ORAL | Status: AC
  Administered 2022-03-04: 10 g via ORAL
  Filled 2022-03-04: qty 2

## 2022-03-04 MED ORDER — INSULIN ASPART 100 UNIT/ML IJ SOLN
0.0000 [IU] | Freq: Three times a day (TID) | INTRAMUSCULAR | Status: DC
  Administered 2022-03-05: 1 [IU] via SUBCUTANEOUS
  Administered 2022-03-06 – 2022-03-07 (×5): 2 [IU] via SUBCUTANEOUS

## 2022-03-04 MED ORDER — OXYCODONE HCL 5 MG PO TABS: 5.0000 mg | ORAL_TABLET | Freq: Four times a day (QID) | ORAL | Status: DC | PRN

## 2022-03-04 MED ORDER — INSULIN ASPART 100 UNIT/ML IV SOLN
5.0000 [IU] | Freq: Once | INTRAVENOUS | Status: AC
  Administered 2022-03-04: 5 [IU] via INTRAVENOUS

## 2022-03-04 MED ORDER — ACETAMINOPHEN 650 MG RE SUPP: 650.0000 mg | Freq: Four times a day (QID) | RECTAL | Status: DC | PRN

## 2022-03-04 NOTE — ED Notes (Signed)
Pt A&O x4. This Rn just hung second 500 bolus. Pt denies any needs. Lab drawing blood work at this time.

## 2022-03-04 NOTE — Telephone Encounter (Signed)
Med sent.

## 2022-03-04 NOTE — Assessment & Plan Note (Addendum)
Hypotensive, systolic down to 17/49.  History of hypertension- Not on medication.  Likely from poor oral intake.  WBC 11.9.  He is afebrile.  He denies signs or symptoms referable to infectious etiology at this time. -Chest x-ray, obtain UA -Will obtain blood cultures as he is relatively immunocompromised. - Hydrate

## 2022-03-04 NOTE — ED Notes (Signed)
Patient states that he does not feel like eating. Continues with neb treatment.

## 2022-03-04 NOTE — H&P (Addendum)
History and Physical    Alexander Banks [id number] DOB: Jan 14, 1956 DOA: 03/04/2022  PCP: [name], MD   Patient coming from: Home  I have personally briefly reviewed patient's old medical records in [place]  Chief Complaint: Hypotension  HPI: Alexander Banks is a 66 y.o. male with medical history significant for  DM, CKD, HTN, Pancreatic Cancer and insufficiency. Patient was sent to the ED from oncologist office with reports of low blood pressure,-53/34.  Patient reports lightheadedness, weakness, no falls. He has had poor oral intake for several days to weeks, no nausea, no vomiting, no diarrhea.  He has chronic back pain related to his pancreatic cancer that is unchanged. No fevers no chills.  No difficulty breathing no cough.  No urinary symptoms.  ED Course: Blood pressure 67/51 in the ED improved to low 100s after 2 L bolus.  Tmax 98.2.  Heart rate 81-93.  Respirate rate 18-24.  O2 sats greater than 92% on room air. Sodium 120.  Potassium elevated at 6.8.  Creatinine elevated 4.5.  WBC 11.9. Lokelma 10 g, NovoLog 5 units, and calcium gluconate 1 g given, DuoNebs also given. Hospitalist to admit for AKI, hypotension and hyperkalemia.  Review of Systems: As per HPI all other systems reviewed and negative.  Past Medical History:  Diagnosis Date   Anemia, iron deficiency    Diabetes mellitus without complication ([place])    on meds   Hearing loss    Bil hearing aids   Hyperlipidemia    Hypertension    Port-A-Cath in place 12/26/2020   Prediabetes    Sleep apnea     Past Surgical History:  Procedure Laterality Date   BILIARY STENT PLACEMENT N/A 11/09/2020   Procedure: BILIARY STENT PLACEMENT - 57 F X 5 CM STENT;  Surgeon: Rogene Houston, MD;  Location: [facility];  Service: Endoscopy;  Laterality: N/A;   BILIARY STENT PLACEMENT N/A 12/27/2020   Procedure: BILIARY STENT PLACEMENT;  Surgeon: Rogene Houston, MD;  Location: [facility];  Service: Endoscopy;   Laterality: N/A;   COLONOSCOPY     ENTEROSCOPY N/A 12/27/2021   Procedure: ENTEROSCOPY;  Surgeon: Mansouraty, Telford Nab., MD;  Location: [facility] ENDOSCOPY;  Service: Gastroenterology;  Laterality: N/A;   ERCP N/A 11/09/2020   Procedure: ENDOSCOPIC RETROGRADE CHOLANGIOPANCREATOGRAPHY (ERCP);  Surgeon: Rogene Houston, MD;  Location: [facility];  Service: Endoscopy;  Laterality: N/A;   ERCP N/A 12/27/2020   Procedure: ENDOSCOPIC RETROGRADE CHOLANGIOPANCREATOGRAPHY (ERCP);  Surgeon: Rogene Houston, MD;  Location: [facility];  Service: Endoscopy;  Laterality: N/A;   ESOPHAGOGASTRODUODENOSCOPY (EGD) WITH PROPOFOL N/A 11/30/2020   Procedure: ESOPHAGOGASTRODUODENOSCOPY (EGD) WITH PROPOFOL;  Surgeon: Milus Banister, MD;  Location: [facility] END[facility];  Service: Endoscopy;  Laterality: N/A;   EUS N/A 11/30/2020   Procedure: UPPER ENDOSCOPIC ULTRASOUND (EUS) RADIAL;  Surgeon: Milus Banister, MD;  Location: [facility] ENDOSCOPY;  Service: Endoscopy;  Laterality: N/A;   FINE NEEDLE ASPIRATION N/A 11/30/2020   Procedure: FINE NEEDLE ASPIRATION (FNA) LINEAR;  Surgeon: Milus Banister, MD;  Location: [facility] ENDOS[facility];  Service: Endoscopy;  Laterality: N/A;   GIVENS CAPSULE STUDY     IR BILIARY DRAIN PLACEMENT WITH CHOLANGIOGRAM  02/04/2022   IR IMAGING GUIDED PORT INSERTION  12/18/2020   KNEE SURGERY     4 surgeries on left, 2 on right knee   SHOULDER SURGERY     right shoulder   SPHINCTEROTOMY N/A 11/09/2020   Procedure: SHORT BILIARY SPHINCTEROTOMY;  Surgeon: Rogene Houston,  MD;  Location: [facility];  Service: Endoscopy;  Laterality: N/A;   WHIPPLE PROCEDURE       reports that he has been smoking cigars. He has never used smokeless tobacco. He reports current alcohol use. He reports that he does not use drugs.  Allergies  Allergen Reactions   Lisinopril Cough    Family History  Problem Relation Age of Onset   Heart attack Father    Heart attack Paternal Grandfather    Leukemia Sister    Heart attack Brother      Prior to Admission medications   Medication Sig Start Date End Date Taking? Authorizing Provider  apixaban (ELIQUIS) 5 MG TABS tablet Take 1 tablet (5 mg total) by mouth 2 (two) times daily. 12/19/21   [name], MD  azelastine (ASTELIN) 0.1 % nasal spray Place 2 sprays into both nostrils 2 (two) times daily. Patient taking differently: Place 2 sprays into both nostrils 2 (two) times daily as needed for allergies. 11/23/21   [name], MD  Continuous Blood Gluc Sensor (FREESTYLE LIBRE 3 SENSOR) MISC 1 Piece by Does not apply route every 14 (fourteen) days. Place 1 sensor on the skin every 14 days. Use to check glucose continuously Dx E11.65 10/22/21   Cassandria Anger, MD  insulin detemir (LEVEMIR) 100 UNIT/ML FlexPen Inject 6 Units into the skin at bedtime. 02/13/22   Cassandria Anger, MD  insulin lispro (HUMALOG KWIKPEN) 100 UNIT/ML KwikPen Inject 4-7 Units into the skin 3 (three) times daily before meals. 02/13/22   Cassandria Anger, MD  levothyroxine (SYNTHROID) 175 MCG tablet TAKE 1 TABLET DAILY Patient taking differently: Take 175 mcg by mouth daily before breakfast. 10/17/21   [name], MD  lipase/protease/amylase (CREON) 36000 UNITS CPEP capsule Take 2 capsules (72,000 Units total) by mouth 3 (three) times daily with meals. May also take 1 capsule (36,000 Units total) as needed (with snacks). Patient taking differently: Take 1 capsule (36,000 Units total) by mouth 3 (three) times daily with meals. May also take 1 capsule (36,000 Units total) as needed (with snacks). 05/31/21   [name], MD  oxyCODONE (OXY IR/ROXICODONE) 5 MG immediate release tablet Take 1 tablet (5 mg total) by mouth every 4 (four) hours as needed for severe pain. 03/04/22   [name], MD  sildenafil (VIAGRA) 100 MG tablet TAKE 1/2 TO 1 TABLET BY MOUTH AS NEEDED FOR ERECTILE DYSFUNCTION Patient taking differently: Take 50-100 mg by mouth as needed for erectile dysfunction. 12/03/21    [name], MD  sodium chloride flush (NS) 0.9 % SOLN 5-10 mLs by Intracatheter route daily. Flush biliary drain with 5 mL normal saline everyday. 02/06/22 03/08/22  Tera Mater, PA-C    Physical Exam: Vitals:   03/04/22 1440 03/04/22 1445 03/04/22 1500 03/04/22 1530  BP: 104/66  121/71 116/66  Pulse: 81 84 87 89  Resp: 14 (!) 24 15 14   Temp:      TempSrc:      SpO2: 92% 96% 98% 95%  Weight:      Height:        Constitutional: Appears weak, calm, comfortable Vitals:   03/04/22 1440 03/04/22 1445 03/04/22 1500 03/04/22 1530  BP: 104/66  121/71 116/66  Pulse: 81 84 87 89  Resp: 14 (!) 24 15 14   Temp:      TempSrc:      SpO2: 92% 96% 98% 95%  Weight:      Height:  Eyes: PERRL, lids and conjunctivae normal ENMT: Mucous membranes are moist.   Neck: normal, supple, no masses, no thyromegaly Respiratory: clear to auscultation bilaterally, no wheezing, no crackles. Normal respiratory effort. No accessory muscle use.  Cardiovascular: Regular rate and rhythm, no murmurs / rubs / gallops. No extremity edema.  Lower extremities warm. Abdomen: no tenderness, no masses palpated. No hepatosplenomegaly. Bowel sounds positive.  Biliary drain to right lateral side upper abdomen, clean dressing, no surrounding erythema or suggestion of infection. Musculoskeletal: no clubbing / cyanosis. No joint deformity upper and lower extremities.  Skin: Port-o cart- Right upper chest, no erythema or drainage no suggestion of infection.  No rashes, lesions, ulcers. No induration Neurologic: 4/5 strength in all extremities.  No facial asymmetry.  Speech clear without evidence of aphasia. Psychiatric: Normal judgment and insight.  Generally weak, but alert and oriented x 3. Normal mood.   Labs on Admission: I have personally reviewed following labs and imaging studies  CBC: Recent Labs  Lab 03/04/22 1401  WBC 11.9*  NEUTROABS 9.1*  HGB 13.0  HCT 37.7*  MCV 93.3  PLT 703   Basic Metabolic  Panel: Recent Labs  Lab 03/04/22 1401  NA 120*  K 6.8*  CL 87*  CO2 21*  GLUCOSE 115*  BUN 78*  CREATININE 4.50*  CALCIUM 9.4   Liver Function Tests: Recent Labs  Lab 03/04/22 1401  AST 27  ALT 44  ALKPHOS 122  BILITOT 3.1*  PROT 8.1  ALBUMIN 3.4*    Radiological Exams on Admission: No results found.  EKG: Independently reviewed.  Sinus rhythm rate 89, QTc 415.  QRS 103.  T waves appear slightly larger in some leads compared to prior EKG.  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  Over the past 2 weeks, his creatinine as increased to 1.4 likely from poor oral intake.  With hyperkalemia of 6.8. -3rd Liter bolus ordered with recurring hypotension -Continue normal saline 100cc/hr x I day  Hyponatremia Hypovolemic hyponatremia.  Sodium of 120.  Likely from poor oral intake. -Check serum osmolality, urine osmolality -Urine sodium   Hyperkalemia Potassium of 6.8.  Likely due to acute kidney injury. -Calcium gluconate 1 g given, Lokelma 10 g, DuoNebs with insulin 5u and D50 given. -Repeat K -IV fluids  DVT (deep venous thrombosis) (HCC) Resume Eliquis  Hypotension Hypotensive, systolic down to 50/09.  History of hypertension- Not on medication.  Likely from poor oral intake.  WBC 11.9.  He is afebrile.  He denies signs or symptoms referable to infectious etiology at this time. -Chest x-ray, obtain UA -Will obtain blood cultures as he is relatively immunocompromised. 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  A1c 5.6.-Reflecting tight control, blood glucose 78. - Hold home Levemir 6u,  - SSI- S   Exocrine pancreatic insufficiency Resume Creon.  Pancreatic adenocarcinoma [facility]) Follows with [name].  Status post Whipple procedure, chemotherapy.  Recent hospitalization 9/15 to 9/20 for obstructive jaundice due to recurrent pancreatic cancer, choledocholithiasis, underwent right PTC and  placement of biliary drain by IR.  Per recent oncology notes -looking into clinical trials, currently not on therapy.   DVT prophylaxis:  heparin Code Status: FULL Family Communication: Spouse and sister-in-law at  bedside. Disposition Plan: ~ 2 days Consults called: None  Admission status: Inpt stepdown I certify that at the point of admission it is my clinical judgment that the patient will require inpatient hospital care spanning beyond 2 midnights from the point of admission due to high intensity of service, high risk for further deterioration and high frequency of surveillance required.    Author: Bethena Roys, MD 03/04/2022 6:32 PM  For on call review [email or web address].

## 2022-03-04 NOTE — Assessment & Plan Note (Signed)
Controlled.  A1c 5.6.-Reflecting tight control, blood glucose 78. - Hold home Levemir 6u,  - SSI- S

## 2022-03-04 NOTE — Assessment & Plan Note (Addendum)
Creatinine elevated at 4.5, baseline ~ 1, likely from hypotension from dehydration/ poor oral intake of several weeks.  Over the past 2 weeks, his creatinine as increased to 1.4 likely from poor oral intake.  With hyperkalemia of 6.8. -3rd Liter bolus ordered with recurring hypotension -Continue normal saline 100cc/hr x I day

## 2022-03-04 NOTE — Telephone Encounter (Signed)
[facility] called - they stated they have received a referral for Palliative care from the cancer center - this is FYI for PCP

## 2022-03-04 NOTE — Assessment & Plan Note (Signed)
Potassium of 6.8.  Likely due to acute kidney injury. -Calcium gluconate 1 g given, Lokelma 10 g, DuoNebs with insulin 5u and D50 given. -Repeat K -IV fluids

## 2022-03-04 NOTE — ED Triage Notes (Signed)
Pt brought down from cancer center for BP of 57/34, pt is alert and oriented and has pressures of around 90 systolic; pt only complaint is middle back pain

## 2022-03-04 NOTE — Assessment & Plan Note (Deleted)
Presenting with hypertension.  Not on medication.

## 2022-03-04 NOTE — Assessment & Plan Note (Deleted)
Controlled.  A1c 5.6.-Reflecting tight control, blood glucose 78. - Hold home Levemir 6u, may need to discontinue on discharge with poor oral intake and A1c of 5.6. - SSI- S

## 2022-03-04 NOTE — Progress Notes (Signed)
Patient presents today for 1 liter of Normal Saline over 2 hours per Dr. Su Hilt. Edwards RN verbal orders.   Upon arrival patient's blood pressure 53/34. Verbal orders received to send patient to the emergency room.   12:58 pm called report to Precious Gilding. Patient taken down to the ED by wheel chair and accompanied by R. Eulas Post RN and T. Social worker.

## 2022-03-04 NOTE — Assessment & Plan Note (Signed)
Resume Eliquis 

## 2022-03-04 NOTE — Assessment & Plan Note (Signed)
Follows with [name].  Status post Whipple procedure, chemotherapy.  Recent hospitalization 9/15 to 9/20 for obstructive jaundice due to recurrent pancreatic cancer, choledocholithiasis, underwent right PTC and placement of biliary drain by IR.  Per recent oncology notes -looking into clinical trials, currently not on therapy.

## 2022-03-04 NOTE — Progress Notes (Signed)
NS orders placed in sign and held.

## 2022-03-04 NOTE — Assessment & Plan Note (Signed)
Resume Creon.

## 2022-03-04 NOTE — Assessment & Plan Note (Addendum)
Hypovolemic hyponatremia.  Sodium of 120.  Likely from poor oral intake. -Check serum osmolality, urine osmolality -Urine sodium

## 2022-03-04 NOTE — Telephone Encounter (Signed)
Pt wife Vaughan Basta calling in requesting refill on patient oxycodone 5 mg. [facility]. Please advise. Thank you

## 2022-03-04 NOTE — ED Provider Notes (Signed)
[facility] EMERGENCY DEPARTMENT Provider Note   CSN: [id number] Arrival date & time: 03/04/22  1303     History  Chief Complaint  Patient presents with   Hypotension    Alexander Banks is a 66 y.o. male with history of pancreatic adenocarcinoma status post chemotherapy infusions, presenting to the ED with generalized weakness and hypotension.  The patient was referred downstairs after presenting to the hospital for IV fluids at the request of his oncologist, with concerns for dehydration.  On presentation patient had blood pressure 53/34.  Patient brought down in a wheelchair.  Patient ports he has been feeling lightheaded, dizzy, weak, poor fluid intakes for the past several days.  Denies diarrhea or fevers.  HPI     Home Medications Prior to Admission medications   Medication Sig Start Date End Date Taking? Authorizing Provider  apixaban (ELIQUIS) 5 MG TABS tablet Take 1 tablet (5 mg total) by mouth 2 (two) times daily. 12/19/21   [name], MD  azelastine (ASTELIN) 0.1 % nasal spray Place 2 sprays into both nostrils 2 (two) times daily. Patient taking differently: Place 2 sprays into both nostrils 2 (two) times daily as needed for allergies. 11/23/21   [name], MD  Continuous Blood Gluc Sensor (FREESTYLE LIBRE 3 SENSOR) MISC 1 Piece by Does not apply route every 14 (fourteen) days. Place 1 sensor on the skin every 14 days. Use to check glucose continuously Dx E11.65 10/22/21   Cassandria Anger, MD  insulin detemir (LEVEMIR) 100 UNIT/ML FlexPen Inject 6 Units into the skin at bedtime. 02/13/22   Cassandria Anger, MD  insulin lispro (HUMALOG KWIKPEN) 100 UNIT/ML KwikPen Inject 4-7 Units into the skin 3 (three) times daily before meals. 02/13/22   Cassandria Anger, MD  levothyroxine (SYNTHROID) 175 MCG tablet TAKE 1 TABLET DAILY Patient taking differently: Take 175 mcg by mouth daily before breakfast. 10/17/21   [name], MD  lipase/protease/amylase  (CREON) 36000 UNITS CPEP capsule Take 2 capsules (72,000 Units total) by mouth 3 (three) times daily with meals. May also take 1 capsule (36,000 Units total) as needed (with snacks). Patient taking differently: Take 1 capsule (36,000 Units total) by mouth 3 (three) times daily with meals. May also take 1 capsule (36,000 Units total) as needed (with snacks). 05/31/21   [name], MD  oxyCODONE (OXY IR/ROXICODONE) 5 MG immediate release tablet Take 1 tablet (5 mg total) by mouth every 4 (four) hours as needed for severe pain. 03/04/22   [name], MD  sildenafil (VIAGRA) 100 MG tablet TAKE 1/2 TO 1 TABLET BY MOUTH AS NEEDED FOR ERECTILE DYSFUNCTION Patient taking differently: Take 50-100 mg by mouth as needed for erectile dysfunction. 12/03/21   [name], MD  sodium chloride flush (NS) 0.9 % SOLN 5-10 mLs by Intracatheter route daily. Flush biliary drain with 5 mL normal saline everyday. 02/06/22 03/08/22  Han, Aimee H, PA-C      Allergies    Lisinopril    Review of Systems   Review of Systems  Physical Exam Updated Vital Signs BP 116/66   Pulse 89   Temp 97.9 F (36.6 C) (Oral)   Resp 14   Ht '6\' 3"'$  (1.905 m)   Wt 67.9 kg   SpO2 95%   BMI 18.71 kg/m  Physical Exam Constitutional:      General: He is not in acute distress.    Comments: Thin, frail  HENT:     Head:  Normocephalic and atraumatic.  Eyes:     Conjunctiva/sclera: Conjunctivae normal.     Pupils: Pupils are equal, round, and reactive to light.  Cardiovascular:     Rate and Rhythm: Normal rate and regular rhythm.     Comments: Right upper chest wall port Pulmonary:     Effort: Pulmonary effort is normal. No respiratory distress.  Abdominal:     General: There is no distension.     Tenderness: There is no abdominal tenderness.  Skin:    General: Skin is warm and dry.  Neurological:     General: No focal deficit present.     Mental Status: He is alert. Mental status is at baseline.     ED Results  / Procedures / Treatments   Labs (all labs ordered are listed, but only abnormal results are displayed) Labs Reviewed  CBC WITH DIFFERENTIAL/PLATELET - Abnormal; Notable for the following components:      Result Value   WBC 11.9 (*)    RBC 4.04 (*)    HCT 37.7 (*)    RDW 15.8 (*)    Neutro Abs 9.1 (*)    Monocytes Absolute 1.5 (*)    All other components within normal limits  COMPREHENSIVE METABOLIC PANEL - Abnormal; Notable for the following components:   Sodium 120 (*)    Potassium 6.8 (*)    Chloride 87 (*)    CO2 21 (*)    Glucose, Bld 115 (*)    BUN 78 (*)    Creatinine, Ser 4.50 (*)    Albumin 3.4 (*)    Total Bilirubin 3.1 (*)    GFR, Estimated 14 (*)    All other components within normal limits  SARS CORONAVIRUS 2 BY RT PCR  TROPONIN I (HIGH SENSITIVITY)  TROPONIN I (HIGH SENSITIVITY)    EKG EKG Interpretation  Date/Time:  Monday March 04 2022 13:29:30 EDT Ventricular Rate:  89 PR Interval:  206 QRS Duration: 103 QT Interval:  341 QTC Calculation: 415 R Axis:   102 Text Interpretation: Sinus rhythm Probable right ventricular hypertrophy ST Confirmed by Octaviano Glow [id number]) on 03/04/2022 3:26:35 PM  Radiology No results found.  Procedures .Critical Care  Performed by: Wyvonnia Dusky, MD Authorized by: Wyvonnia Dusky, MD   Critical care provider statement:    Critical care time (minutes):  45   Critical care time was exclusive of:  Separately billable procedures and treating other patients   Critical care was necessary to treat or prevent imminent or life-threatening deterioration of the following conditions:  Metabolic crisis   Critical care was time spent personally by me on the following activities:  Ordering and performing treatments and interventions, ordering and review of laboratory studies, ordering and review of radiographic studies, pulse oximetry, review of old charts, examination of patient and evaluation of patient's response to  treatment   Care discussed with: admitting provider   Comments:     Management of hyperkalemia     Medications Ordered in ED Medications  sodium zirconium cyclosilicate (LOKELMA) packet 10 g (has no administration in time range)  calcium gluconate 1 g/ 50 mL sodium chloride IVPB (has no administration in time range)  albuterol (PROVENTIL) (2.5 MG/3ML) 0.083% nebulizer solution 10 mg (has no administration in time range)  insulin aspart (novoLOG) injection 5 Units (has no administration in time range)    And  dextrose 50 % solution 50 mL (has no administration in time range)  sodium chloride 0.9 % bolus 2,000 mL (  2,000 mLs Intravenous New Bag/Given 03/04/22 1412)    ED Course/ Medical Decision Making/ A&P Clinical Course as of 03/04/22 [place] Mar 04, 2022  1536 Patient reassessed and blood pressure is improved and he is feeling better after the fluids.  His family is now present at bedside.  We discussed admission for an AKI.  Discussed treatment for hyperkalemia.  They are in agreement.  Hospitalist paged for admission [MT]    Clinical Course User Index [MT] Velecia Ovitt, Carola Rhine, MD                           Medical Decision Making Amount and/or Complexity of Data Reviewed Labs: ordered. ECG/medicine tests: ordered.  Risk OTC drugs. Prescription drug management. Decision regarding hospitalization.   This patient presents to the ED with concern for hypotension, weakness, lightheadedness. This involves an extensive number of treatment options, and is a complaint that carries with it a high risk of complications and morbidity.  The differential diagnosis includes dehydration versus anemia versus infection versus other  Co-morbidities that complicate the patient evaluation: History of pancreatic adenocarcinoma with chemo infusions at high risk for dehydration and poor appetite  External records from outside source obtained and reviewed including oncology staff notes  I ordered  and personally interpreted labs.  The pertinent results include: AKI with elevation of creatinine potassium of 6.8.  The patient was maintained on a cardiac monitor.  I personally viewed and interpreted the cardiac monitored which showed an underlying rhythm of: Normal sinus rhythm  Per my interpretation the patient's ECG shows sinus rhythm without acute ischemic findings, no evident hyperkalemic changes  I ordered medication including IV fluids for dehydration and hypovolemia, medications for hyperkalemia  I have reviewed the patients home medicines and have made adjustments as needed  Test Considered: Doubt acute PE, sepsis  After the interventions noted above, I reevaluated the patient and found that they have: improved   Dispostion:  After consideration of the diagnostic results and the patients response to treatment, I feel that the patent would benefit from medical admission.         Final Clinical Impression(s) / ED Diagnoses Final diagnoses:  Dehydration  AKI (acute kidney injury) ([place])  Hyperkalemia    Rx / DC Orders ED Discharge Orders     None         Muhamad Serano, Carola Rhine, MD 03/04/22 1537

## 2022-03-04 NOTE — Progress Notes (Signed)
Patient's wife called stating that the patient seems to be dehydrated as he is not eating/drinking as he normally does. She states that he is also weaker than he was at his last visit with [name]. She requests fluids. Fluids scheduled for today at 1230 per [name]. Orders received for NS 1L over 2 hours.

## 2022-03-05 DIAGNOSIS — K8681 Exocrine pancreatic insufficiency: Secondary | ICD-10-CM

## 2022-03-05 DIAGNOSIS — E871 Hypo-osmolality and hyponatremia: Secondary | ICD-10-CM | POA: Diagnosis not present

## 2022-03-05 DIAGNOSIS — E875 Hyperkalemia: Secondary | ICD-10-CM | POA: Diagnosis not present

## 2022-03-05 DIAGNOSIS — K8689 Other specified diseases of pancreas: Secondary | ICD-10-CM | POA: Diagnosis not present

## 2022-03-05 DIAGNOSIS — G4733 Obstructive sleep apnea (adult) (pediatric): Secondary | ICD-10-CM

## 2022-03-05 DIAGNOSIS — N179 Acute kidney failure, unspecified: Secondary | ICD-10-CM | POA: Diagnosis not present

## 2022-03-05 LAB — BASIC METABOLIC PANEL
Anion gap: 12 (ref 5–15)
Anion gap: 7 (ref 5–15)
BUN: 67 mg/dL — ABNORMAL HIGH (ref 8–23)
BUN: 58 mg/dL — ABNORMAL HIGH (ref 8–23)
CO2: 16 mmol/L — ABNORMAL LOW (ref 22–32)
CO2: 20 mmol/L — ABNORMAL LOW (ref 22–32)
Calcium: 8.8 mg/dL — ABNORMAL LOW (ref 8.9–10.3)
Calcium: 8.6 mg/dL — ABNORMAL LOW (ref 8.9–10.3)
Chloride: 99 mmol/L (ref 98–111)
Chloride: 102 mmol/L (ref 98–111)
Creatinine, Ser: 2.92 mg/dL — ABNORMAL HIGH (ref 0.61–1.24)
Creatinine, Ser: 1.86 mg/dL — ABNORMAL HIGH (ref 0.61–1.24)
GFR, Estimated: 23 mL/min — ABNORMAL LOW (ref >60)
GFR, Estimated: 40 mL/min — ABNORMAL LOW (ref >60)
Glucose, Bld: 146 mg/dL — ABNORMAL HIGH (ref 70–99)
Glucose, Bld: 132 mg/dL — ABNORMAL HIGH (ref 70–99)
Potassium: 5.9 mmol/L — ABNORMAL HIGH (ref 3.5–5.1)
Potassium: 5.2 mmol/L — ABNORMAL HIGH (ref 3.5–5.1)
Sodium: 127 mmol/L — ABNORMAL LOW (ref 135–145)
Sodium: 129 mmol/L — ABNORMAL LOW (ref 135–145)

## 2022-03-05 LAB — CBC
HCT: 39.7 % (ref 39.0–52.0)
Hemoglobin: 13.2 g/dL (ref 13.0–17.0)
MCH: 32.5 pg (ref 26.0–34.0)
MCHC: 33.2 g/dL (ref 30.0–36.0)
MCV: 97.8 fL (ref 80.0–100.0)
Platelets: 235 10*3/uL (ref 150–400)
RBC: 4.06 MIL/uL — ABNORMAL LOW (ref 4.22–5.81)
RDW: 15.5 % (ref 11.5–15.5)
WBC: 10.3 10*3/uL (ref 4.0–10.5)
nRBC: 0 % (ref 0.0–0.2)

## 2022-03-05 LAB — CBG MONITORING, ED: Glucose-Capillary: 159 mg/dL — ABNORMAL HIGH (ref 70–99)

## 2022-03-05 LAB — TROPONIN I (HIGH SENSITIVITY): Troponin I (High Sensitivity): 6 ng/L (ref <18)

## 2022-03-05 LAB — GLUCOSE, CAPILLARY
Glucose-Capillary: 145 mg/dL — ABNORMAL HIGH (ref 70–99)
Glucose-Capillary: 115 mg/dL — ABNORMAL HIGH (ref 70–99)
Glucose-Capillary: 111 mg/dL — ABNORMAL HIGH (ref 70–99)
Glucose-Capillary: 129 mg/dL — ABNORMAL HIGH (ref 70–99)

## 2022-03-05 LAB — MRSA NEXT GEN BY PCR, NASAL: MRSA by PCR Next Gen: NOT DETECTED

## 2022-03-05 LAB — OSMOLALITY: Osmolality: 295 mOsm/kg (ref 275–295)

## 2022-03-05 LAB — CORTISOL: Cortisol, Plasma: 33.9 ug/dL

## 2022-03-05 MED ORDER — CALCIUM GLUCONATE-NACL 1-0.675 GM/50ML-% IV SOLN
1.0000 g | Freq: Once | INTRAVENOUS | Status: AC
  Administered 2022-03-05: 1000 mg via INTRAVENOUS
  Filled 2022-03-05: qty 50

## 2022-03-05 MED ORDER — SODIUM ZIRCONIUM CYCLOSILICATE 10 G PO PACK
10.0000 g | PACK | Freq: Once | ORAL | Status: AC
  Administered 2022-03-05: 10 g via ORAL
  Filled 2022-03-05: qty 1

## 2022-03-05 MED ORDER — ORAL CARE MOUTH RINSE: 15.0000 mL | OROMUCOSAL | Status: DC | PRN

## 2022-03-05 MED ORDER — CHLORHEXIDINE GLUCONATE CLOTH 2 % EX PADS
6.0000 | MEDICATED_PAD | Freq: Every day | CUTANEOUS | Status: DC
  Administered 2022-03-05 – 2022-03-07 (×3): 6 via TOPICAL

## 2022-03-05 MED ORDER — SODIUM CHLORIDE 0.9 % IV SOLN: INTRAVENOUS | Status: AC

## 2022-03-05 MED ORDER — APIXABAN 5 MG PO TABS
5.0000 mg | ORAL_TABLET | Freq: Two times a day (BID) | ORAL | Status: DC
  Administered 2022-03-05 – 2022-03-07 (×4): 5 mg via ORAL
  Filled 2022-03-05 (×4): qty 1

## 2022-03-05 MED ORDER — ENSURE ENLIVE PO LIQD
237.0000 mL | Freq: Two times a day (BID) | ORAL | Status: DC
  Administered 2022-03-06 – 2022-03-07 (×4): 237 mL via ORAL

## 2022-03-05 MED ORDER — APIXABAN 2.5 MG PO TABS: 2.5000 mg | ORAL_TABLET | Freq: Two times a day (BID) | ORAL | Status: DC

## 2022-03-05 MED ORDER — SODIUM BICARBONATE 650 MG PO TABS
650.0000 mg | ORAL_TABLET | Freq: Three times a day (TID) | ORAL | Status: DC
  Administered 2022-03-05 – 2022-03-07 (×7): 650 mg via ORAL
  Filled 2022-03-05 (×7): qty 1

## 2022-03-05 MED ORDER — SENNOSIDES-DOCUSATE SODIUM 8.6-50 MG PO TABS
1.0000 | ORAL_TABLET | Freq: Two times a day (BID) | ORAL | Status: DC
  Administered 2022-03-05 – 2022-03-07 (×3): 1 via ORAL
  Filled 2022-03-05 (×4): qty 1

## 2022-03-05 NOTE — Assessment & Plan Note (Signed)
-  Continue to provide care -No signs of superimposed infection currently.

## 2022-03-05 NOTE — Progress Notes (Signed)
ST elevation on monitor when pt got to floor. EHG obtained that said STEMI. Zierle-Ghosh, DO made aware. She read the EKG and it is not a STEMI. Will continue to monitor.

## 2022-03-05 NOTE — TOC Initial Note (Signed)
Transition of Care [facility]) - Initial/Assessment Note    Patient Details  Name: Alexander Banks MRN: [id number] Date of Birth: 03/01/1956  [facility]) CM/SW Contact:    Salome Arnt, [place] Phone Number: 03/05/2022, 9:08 AM  Clinical Narrative: Pt admitted with acute kidney injury. Assessment completed due to high risk readmission score. Pt asleep at time of assessment, so completed with pt's wife. Pt's wife indicates pt works full-time in [employer] from home. He is independent with ADLs and ambulates with a cane. Pt has transportation to appointments. He was diagnosed with pancreatic cancer in June 2022 and is followed by [name]. Pt's wife reports [facility] palliative care was scheduled to have first visit this week with pt. Pt's wife plans to take pt home when medically stable. TOC will continue to follow.                   Expected Discharge Plan: Home/Self Care Barriers to Discharge: Continued Medical Work up   Patient Goals and CMS Choice Patient states their goals for this hospitalization and ongoing recovery are:: return home   Choice offered to / list presented to : Spouse  Expected Discharge Plan and Services Expected Discharge Plan: Home/Self Care In-house Referral: Clinical Social Work     Living arrangements for the past 2 months: Single Family Home                                      Prior Living Arrangements/Services Living arrangements for the past 2 months: Single Family Home Lives with:: Spouse Patient language and need for interpreter reviewed:: Yes          Care giver support system in place?: Yes (comment) Current home services: DME (cane) Criminal Activity/Legal Involvement Pertinent to Current Situation/Hospitalization: No - Comment as needed  Activities of Daily Living      Permission Sought/Granted                  Emotional Assessment       Orientation: : Oriented to Self, Oriented to Place,  Oriented to  Time, Oriented to Situation Alcohol / Substance Use: Not Applicable Psych Involvement: No (comment)  Admission diagnosis:  Dehydration [E86.0] Hyperkalemia [E87.5] AKI (acute kidney injury) ([place]) [N17.9] Patient Active Problem List   Diagnosis Date Noted   DVT (deep venous thrombosis) ([place]) 03/04/2022   Hyperkalemia 03/04/2022   Choledocholithiasis 02/02/2022   Fever of unknown origin 12/11/2021   Fever and chills 12/11/2021   Hypotension 12/11/2021   SIRS (systemic inflammatory response syndrome) ([place]) 12/11/2021   Lactic acidosis 12/11/2021   Hyperglycemia 12/11/2021   Leukocytosis 12/11/2021   Abnormal weight loss 12/11/2021   Hypoalbuminemia 12/11/2021   Severe protein-calorie malnutrition 12/11/2021   Acquired thrombophilia ([place]) 12/11/2021   Immunocompromised ([place]) 12/11/2021   Exocrine pancreatic insufficiency 10/10/2021   Diabetes mellitus secondary to pancreatic insufficiency ([place]) 10/10/2021   Genetic testing 04/30/2021   History of cholangitis 01/16/2021   Current smoker 01/02/2021   Cholangitis    Port-A-Cath in place 12/26/2020   Bile duct obstruction 12/21/2020   Pancreatic adenocarcinoma ([place]) 12/04/2020   Erectile dysfunction 11/17/2020   Mixed hyperlipidemia 11/17/2020   Type 2 diabetes mellitus with stage 3a chronic kidney disease, with long-term current use of insulin ([place]) 11/13/2020   Pancreatic tumor    Chronic cholecystitis    Elevated LFTs    DKA (diabetic ketoacidosis) (  [place]) 11/07/2020   Hyperglycemia due to diabetes mellitus ([place]) 11/07/2020   Rectal abscess 11/07/2020   Hyponatremia 11/07/2020   Macrocytic anemia 11/07/2020   AKI (acute kidney injury) ([place]) 11/07/2020   Dehydration 11/07/2020   Cellulitis 11/07/2020   Hypothyroidism 09/27/2014   Obstructive sleep apnea 12/07/2012   Osteoarthritis 12/07/2012   Hyperlipidemia 10/19/2012   Essential hypertension, benign 10/19/2012   PCP:  [name], MD Pharmacy:    [facility], [place] [address] Phone: [phone] Fax: [phone]  [place], [place] [address] Phone: [phone] Fax: [phone]  [facility] [phone] - [place], [place] [address] [place] [address] [place] [address] Phone: [phone] Fax: [phone]     Social Determinants of Health (SDOH) Interventions    Readmission Risk Interventions    03/05/2022    9:06 AM  Readmission Risk Prevention Plan  Transportation Screening Complete  HRI or [place] Complete  Social Work Consult for [place] Planning/Counseling Complete  Palliative Care Screening Not Applicable  Medication Review Press [employer]) Complete

## 2022-03-05 NOTE — Assessment & Plan Note (Signed)
-  continue oxygen saturation as needed  -not currently using CPAP.

## 2022-03-05 NOTE — Progress Notes (Signed)
RN paged re concern for ST elevation on previous EKG. New EKG obtained. Changes are consistent with hyperkalemia. Patient is chest pain free. Troponins at arrival were 6,5. Will add on a troponin to AM labs for completeness. Continue to monitor.

## 2022-03-05 NOTE — Progress Notes (Signed)
Progress Note   Patient: Alexander Banks DOB: 1956-02-22 DOA: 03/04/2022     1 DOS: the patient was seen and examined on 03/05/2022   Brief hospital course: As per H&P written by Dr. Denton Brick on 03/04/2022  Alexander Banks is a 66 y.o. male with medical history significant for  DM, CKD, HTN, Pancreatic Cancer and insufficiency. Patient was sent to the ED from oncologist office with reports of low blood pressure,-53/34.  Patient reports lightheadedness, weakness, no falls. He has had poor oral intake for several days to weeks, no nausea, no vomiting, no diarrhea.  He has chronic back pain related to his pancreatic cancer that is unchanged. No fevers no chills.  No difficulty breathing no cough.  No urinary symptoms.   ED Course: Blood pressure 67/51 in the ED improved to low 100s after 2 L bolus.  Tmax 98.2.  Heart rate 81-93.  Respirate rate 18-24.  O2 sats greater than 92% on room air. Sodium 120.  Potassium elevated at 6.8.  Creatinine elevated 4.5.  WBC 11.9. Lokelma 10 g, NovoLog 5 units, and calcium gluconate 1 g given, DuoNebs also given. Hospitalist to admit for AKI, hypotension and hyperkalemia.   Assessment and Plan: * AKI (acute kidney injury) ([place]) -Creatinine elevated at 4.5 at time of admission. -Patient with component of chronic kidney disease stage IIIa at baseline. -Appears to be in the setting of hypertension, hyponatremia and prerenal azotemia. -Continue fluid resuscitation -Follow blood pressure response -Checking cortisol; if low will start treatment with midodrine -Follow electrolytes and renal function trend.    Hyponatremia -In the setting of decreased oral intake and hypovolemia/dehydration -Sodium level at time of admission 120 -Continue fluid resuscitation -Follow electrolytes trend. -Cortisol level has been ordered and pending.   -Urine/serum osmolality suggesting hypovolemia. -Patient advised to maintain adequate oral intake and  hydration.   Hyperkalemia -Potassium of 6.8.  Likely due to acute kidney injury. -Continue telemetry monitoring -Repeat calcium gluconate, Lokelma and start bicarbonate. -Patient will also receive insulin. -Provide fluid resuscitation and follow electrolytes trend. -Potassium 5.9 currently.   DVT (deep venous thrombosis) (H[facility]) -Continue Eliquis -Dose adjusted in the setting of renal failure.  Hypotension -Hypotensive, systolic down to 63/14.  History of hypertension- Not on medication.  Likely from poor oral intake.   -no signs or source of of infection -Chest x-ray without acute cardiopulmonary process. -UA not suggesting UTI. -Continue aggressive hydration -Follow vital signs and cortisol level.   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  A1c 5.6. -Reflecting tight control, blood glucose 78. -Sinew home dose of Levemir and use SSI -Follow CBGs fluctuation.    Exocrine pancreatic insufficiency -Continue Creon. -Patient was started on Ensure and diet liberalized -Instructed to increase oral nutrition.  Port-A-Cath in place -Continue to provide care -No signs of superimposed infection currently.  Pancreatic adenocarcinoma ([place]) -Follows with [name].  Status post Whipple procedure, chemotherapy.  -Recent hospitalization 9/15 to 9/20 for obstructive jaundice due to recurrent pancreatic cancer, choledocholithiasis, underwent right PTC and placement of biliary drain by IR.  Per recent oncology notes -looking into clinical trials, currently not on therapy. -continue outpatient follow up collagen service -Guarded prognosis.  Obstructive sleep apnea -continue oxygen saturation as needed  -not currently using CPAP.    Subjective:  Chronically ill in appearance, reporting no appetite; no chest pain, no nausea, no vomiting.  Patient expressed no bowel movement since Friday (03/01/2022).  Physical Exam: Vitals:   03/05/22 0553  03/05/22 0600 03/05/22  0744 03/05/22 1118  BP:  (!) 96/53    Pulse:  93    Resp:  18    Temp:  98.2 F (36.8 C) (!) 97.5 F (36.4 C) 97.7 F (36.5 C)  TempSrc:  Oral Oral Oral  SpO2: 100%     Weight: 69.6 kg     Height: 6' 2.5" (1.892 m)      General exam: Alert, awake, oriented x 3; chronically ill in appearance and expressing to feel generally weak.  Patient reports no appetite. Respiratory system: Clear to auscultation. Respiratory effort normal.  Good saturation on room air. Cardiovascular system:RRR.  No rubs, no gallops, no JVD. Gastrointestinal system: Abdomen is nondistended, soft and nontender.  Positive bowel sounds appreciated.  Biliary drain to right lateral side upper abdomen, clean dressings in place.  There was no surrounding erythema or suggestion for superimposed infection on exam. Central nervous system: Alert and oriented. No focal neurological deficits. Extremities: No cyanosis or clubbing. Skin: No petechiae; Port-A-Cath in the right upper chest appreciated. Psychiatry: Judgement and insight appear normal. Mood & affect appropriate.   Data Reviewed: Basic metabolic panel: Sodium 086, potassium 5.9, bicarb 16, glucose 146, BUN 67, creatinine 2.92 and GFR 23. CBC: WBCs 10.3, hemoglobin 13.2 and platelet count 235 K High sensitive troponin: 5>> 6 MRSA PCR not detected.    Family Communication: Wife at bedside.  Disposition: Status is: Inpatient Remains inpatient appropriate because: Still receiving treatment for acute on chronic renal failure and hyperkalemia.   Planned Discharge Destination: Home   Author: Barton Dubois, MD 03/05/2022 11:39 AM  For on call review [email or web address].

## 2022-03-06 DIAGNOSIS — N179 Acute kidney failure, unspecified: Secondary | ICD-10-CM | POA: Diagnosis not present

## 2022-03-06 LAB — BASIC METABOLIC PANEL
Anion gap: 7 (ref 5–15)
BUN: 50 mg/dL — ABNORMAL HIGH (ref 8–23)
CO2: 18 mmol/L — ABNORMAL LOW (ref 22–32)
Calcium: 8.3 mg/dL — ABNORMAL LOW (ref 8.9–10.3)
Chloride: 104 mmol/L (ref 98–111)
Creatinine, Ser: 1.42 mg/dL — ABNORMAL HIGH (ref 0.61–1.24)
GFR, Estimated: 55 mL/min — ABNORMAL LOW (ref >60)
Glucose, Bld: 181 mg/dL — ABNORMAL HIGH (ref 70–99)
Potassium: 4.6 mmol/L (ref 3.5–5.1)
Sodium: 129 mmol/L — ABNORMAL LOW (ref 135–145)

## 2022-03-06 LAB — GLUCOSE, CAPILLARY
Glucose-Capillary: 191 mg/dL — ABNORMAL HIGH (ref 70–99)
Glucose-Capillary: 195 mg/dL — ABNORMAL HIGH (ref 70–99)
Glucose-Capillary: 175 mg/dL — ABNORMAL HIGH (ref 70–99)
Glucose-Capillary: 161 mg/dL — ABNORMAL HIGH (ref 70–99)

## 2022-03-06 MED ORDER — TAMSULOSIN HCL 0.4 MG PO CAPS
0.4000 mg | ORAL_CAPSULE | Freq: Every day | ORAL | Status: DC
  Administered 2022-03-06: 0.4 mg via ORAL
  Filled 2022-03-06: qty 1

## 2022-03-06 MED ORDER — LACTULOSE 10 GM/15ML PO SOLN
60.0000 g | Freq: Once | ORAL | Status: AC
  Administered 2022-03-06: 60 g via ORAL
  Filled 2022-03-06: qty 90

## 2022-03-06 MED ORDER — LACTULOSE 10 GM/15ML PO SOLN
30.0000 g | Freq: Every day | ORAL | Status: DC
  Administered 2022-03-07: 30 g via ORAL
  Filled 2022-03-06: qty 60

## 2022-03-06 NOTE — Progress Notes (Signed)
Progress Note   Patient: Alexander Banks SAY:[id number] DOB: 1955-10-21 DOA: 03/04/2022     2 DOS: the patient was seen and examined on 03/06/2022   Brief hospital course: As per H&P written by Dr. Denton Brick on 03/04/2022  Alexander Banks is a 66 y.o. male with medical history significant for  DM, CKD, HTN, Pancreatic Cancer and insufficiency. Patient was sent to the ED from oncologist office with reports of low blood pressure,-53/34.  Patient reports lightheadedness, weakness, no falls. He has had poor oral intake for several days to weeks, no nausea, no vomiting, no diarrhea.  He has chronic back pain related to his pancreatic cancer that is unchanged. No fevers no chills.  No difficulty breathing no cough.  No urinary symptoms.   ED Course: Blood pressure 67/51 in the ED improved to low 100s after 2 L bolus.  Tmax 98.2.  Heart rate 81-93.  Respirate rate 18-24.  O2 sats greater than 92% on room air. Sodium 120.  Potassium elevated at 6.8.  Creatinine elevated 4.5.  WBC 11.9. Lokelma 10 g, NovoLog 5 units, and calcium gluconate 1 g given, DuoNebs also given. Hospitalist to admit for AKI, hypotension and hyperkalemia.   Assessment and Plan: * AKI (acute kidney injury) ([place]) -Creatinine elevated at 4.5 at time of admission. -Patient with component of chronic kidney disease stage IIIa at baseline. -Appears to be in the setting of hypertension, hyponatremia and prerenal azotemia. -Continue fluid resuscitation -Creatinine is improved significantly down to 1.4 from 4.5 -Random cortisol level 33.9 which is normal -Follow electrolytes and renal function trend.    Hyponatremia -In the setting of decreased oral intake and hypovolemia/dehydration -Sodium level at time of admission 120 -Continue fluid resuscitation -Cortisol level as above #1 -Sodium improved from 1 20-1 29 which is close to prior baseline -Urine/serum osmolality suggesting hypovolemia. -Patient advised to maintain adequate  oral intake and hydration.   Hyperkalemia -Potassium of 6.8.  Likely due to acute kidney injury. -Continue telemetry monitoring -Repeat calcium gluconate, Lokelma and start bicarbonate. -Patient will also receive insulin. -Provide fluid resuscitation and follow electrolytes trend. -Potassium 4.6 currently.   DVT (deep venous thrombosis) (HCC) -Continue Eliquis -Dose adjusted in the setting of renal failure.  Hypotension -Hypotensive, systolic down to 23/55.  History of hypertension- Not on medication.  Likely from poor oral intake.   -no signs or source of of infection -Chest x-ray without acute cardiopulmonary process. -UA not suggesting UTI. -Continue aggressive hydration -Follow vital signs and cortisol level.   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  A1c 5.6. -Reflecting tight control, blood glucose 78. -Sinew home dose of Levemir and use SSI -Follow CBGs fluctuation.    Exocrine pancreatic insufficiency -Continue Creon. -Patient was started on Ensure and diet liberalized -Instructed to increase oral nutrition.  Port-A-Cath in place -Continue to provide care -No signs of superimposed infection currently.  Pancreatic adenocarcinoma ([place]) -Follows with [name].  Status post Whipple procedure, chemotherapy.  -Recent hospitalization 9/15 to 9/20 for obstructive jaundice due to recurrent pancreatic cancer, choledocholithiasis, underwent right PTC and placement of biliary drain by IR.  Per recent oncology notes -looking into clinical trials, currently not on therapy. -continue outpatient follow up collagen service -Guarded prognosis.  Obstructive sleep apnea -continue oxygen saturation as needed  -not currently using CPAP.  Constipation--- lactulose as ordered   Subjective:  -Complains of constipation -Lactulose ordered -Oral intake is not great -Wife at bedside  Physical Exam: Vitals:   03/05/22 2000 03/06/22 0046  03/06/22 0540 03/06/22 1359  BP:  93/63 105/71 101/66  Pulse: 93 91 (!) 102 100  Resp: (!) _0 Temp:  98 F (36.7 C) (!) 97 F (36.1 C) 98.3 F (36.8 C)  TempSrc:    Oral  SpO2: 99% 99% 100% 99%  Weight:      Height:       General exam: Alert, awake, oriented x 3; chronically ill in appearance and expressing to feel generally weak.  Patient reports no appetite. Respiratory system: Clear to auscultation. Respiratory effort normal.  Good saturation on room air. Cardiovascular system:RRR.  No rubs, no gallops, no JVD. Gastrointestinal system: Abdomen is nondistended, soft and nontender.  Positive bowel sounds appreciated.  Biliary drain to right lateral side upper abdomen, clean dressings in place.  There was no surrounding erythema or suggestion for superimposed infection on exam. Central nervous system: Alert and oriented. No focal neurological deficits. Extremities: No cyanosis or clubbing. Skin: No petechiae; Port-A-Cath in the right upper chest appreciated. Psychiatry: Judgement and insight appear normal. Mood & affect appropriate.     Family Communication: Wife at bedside.  Disposition: Status is: Inpatient Remains inpatient appropriate because: Still receiving treatment for acute on chronic renal failure and hyperkalemia.   Planned Discharge Destination: Home   Author: Roxan Hockey, MD 03/06/2022 6:19 PM  For on call review [email or web address].

## 2022-03-06 NOTE — Progress Notes (Addendum)
Patient states that he has not urinated much all day. Bladder scan was completed and showed >508. MD Emokpae notified. In and out completed with an output of 600 ml.

## 2022-03-06 NOTE — Progress Notes (Signed)
Initial Nutrition Assessment  DOCUMENTATION CODES:   Severe malnutrition in context of acute illness/injury  INTERVENTION:  Ensure Enlive po BID   Regular diet with chocolate milk  Handout- Constipation Nutrition Therapy to spouse. She was not present when patient was educated earlier this month.  NUTRITION DIAGNOSIS:   Severe Malnutrition related to acute illness as evidenced by per patient/family report, energy intake < 75% for > or equal to 1 month (dehydrated, constipated on admission, and high OP through drain.) Unplanned weight loss 9.4% < 3 months.   GOAL:  Patient will meet greater than or equal to 90% of their needs   MONITOR:  PO intake, Supplement acceptance, Labs, Weight trends  REASON FOR ASSESSMENT:   Consult (rounds)    ASSESSMENT: Patient is a 66 yo male with DM, Anemia, CKD, Pancreatic cancer and insufficiency. AKI, hyponatremia, decreased oral intake.   Per RD-AP [place] on 10/2 -S/p neoadjuvant Folfirinox (8/24-10/26) followed by Whipple under care of Dr. Renato Shin 05/15/21 and adjuvant gemcitabine + xeloda intake 20-30% x 3 meals.   Patient spouse is bedside today. During September and October patient has declined in his intake and has been feeling poorly overall. Complains of being uncomfortable since biliary drain placement and not wanted to eat. He has acute severe weight loss 9.4% < 3 months.  Meal intake yesterday 20-30% x 3. Feeding himself. He ate 2 slices of bacon, fruit, milk and 50% of an Ensure this morning. He is struggling with constipation. Nursing reports small BM earlier today.  Fluids encouraged but patient having difficulty keeping up with requirements to prevent dehydration. He is receiving Ensure through the [facility] but acceptance has been limited.   Medications: insulin, Creon 36,000 TID before meals, Miralax BID, Senokot-s, sodium bicarbonate TID.     Latest Ref Rng & Units 03/06/2022    6:25 AM 03/05/2022    8:57 PM  03/05/2022    4:29 AM  BMP  Glucose 70 - 99 mg/dL 181  132  146   BUN 8 - 23 mg/dL 50  58  67   Creatinine 0.61 - 1.24 mg/dL 1.42  1.86  2.92   Sodium 135 - 145 mmol/L 129  129  127   Potassium 3.5 - 5.1 mmol/L 4.6  5.2  5.9   Chloride 98 - 111 mmol/L 104  102  99   CO2 22 - 32 mmol/L '18  20  16   '$ Calcium 8.9 - 10.3 mg/dL 8.3  8.6  8.8       NUTRITION - FOCUSED PHYSICAL EXAM: Nutrition-Focused physical exam completed. Findings are moderate and severe fat depletion, moderate and severe muscle depletion, and mild edema.     Diet Order:   Diet Order             Diet regular Room service appropriate? Yes; Fluid consistency: Thin  Diet effective now                   EDUCATION NEEDS:  Education needs have been addressed  Skin:  Skin Assessment: Reviewed RN Assessment- biliary drain ~1000-1500 OP daily per spouse  Last BM:  10/13- constipation per nursing  Height:   Ht Readings from Last 1 Encounters:  03/05/22 6' 2.5" (1.892 m)    Weight:   Wt Readings from Last 1 Encounters:  03/05/22 69.6 kg    Ideal Body Weight:   89 kg  BMI:  Body mass index is 19.44 kg/m.  Estimated Nutritional Needs:   Kcal:  2100-2300  Protein:  95-105 gr  Fluid:  >2 liters daily  Colman Cater MS,RD,CSG,LDN Contact: Shea Evans

## 2022-03-06 NOTE — Progress Notes (Signed)
Patient alert and orient x4. Patient transferred from ICU. Patient resting comfortably in bed. Telemetry box on. Will continue to monitor.

## 2022-03-06 NOTE — Progress Notes (Signed)
Patient's wife states that she would like the doctor to look at patient's bottom because she thinks that he could be constipated. MD Emokpae notified.

## 2022-03-07 DIAGNOSIS — Z7189 Other specified counseling: Secondary | ICD-10-CM | POA: Diagnosis not present

## 2022-03-07 DIAGNOSIS — E860 Dehydration: Secondary | ICD-10-CM | POA: Diagnosis not present

## 2022-03-07 DIAGNOSIS — N179 Acute kidney failure, unspecified: Secondary | ICD-10-CM | POA: Diagnosis not present

## 2022-03-07 DIAGNOSIS — Z515 Encounter for palliative care: Secondary | ICD-10-CM | POA: Diagnosis not present

## 2022-03-07 DIAGNOSIS — K831 Obstruction of bile duct: Secondary | ICD-10-CM

## 2022-03-07 DIAGNOSIS — D490 Neoplasm of unspecified behavior of digestive system: Secondary | ICD-10-CM

## 2022-03-07 LAB — GLUCOSE, CAPILLARY
Glucose-Capillary: 156 mg/dL — ABNORMAL HIGH (ref 70–99)
Glucose-Capillary: 164 mg/dL — ABNORMAL HIGH (ref 70–99)

## 2022-03-07 MED ORDER — HEPARIN SOD (PORK) LOCK FLUSH 100 UNIT/ML IV SOLN: 500.0000 [IU] | Freq: Once | INTRAVENOUS | Status: DC

## 2022-03-07 MED ORDER — TAMSULOSIN HCL 0.4 MG PO CAPS: 0.4000 mg | ORAL_CAPSULE | Freq: Every day | ORAL | 1 refills | Status: DC

## 2022-03-07 MED ORDER — HEPARIN SOD (PORK) LOCK FLUSH 100 UNIT/ML IV SOLN
INTRAVENOUS | Status: AC
  Filled 2022-03-07: qty 5

## 2022-03-07 MED ORDER — SODIUM BICARBONATE 650 MG PO TABS: 650.0000 mg | ORAL_TABLET | Freq: Two times a day (BID) | ORAL | 3 refills | Status: DC

## 2022-03-07 MED ORDER — ENSURE ENLIVE PO LIQD: 237.0000 mL | Freq: Two times a day (BID) | ORAL | 12 refills | Status: DC

## 2022-03-07 MED ORDER — LACTULOSE 10 GM/15ML PO SOLN: 20.0000 g | Freq: Every day | ORAL | 2 refills | Status: DC

## 2022-03-07 MED ORDER — PANCRELIPASE (LIP-PROT-AMYL) 36000-114000 UNITS PO CPEP: ORAL_CAPSULE | ORAL | 5 refills | Status: DC

## 2022-03-07 MED ORDER — SODIUM CHLORIDE 0.9 % IV SOLN: INTRAVENOUS | Status: DC

## 2022-03-07 NOTE — Consult Note (Addendum)
   [facility] CM Inpatient Consult   03/07/2022  [name] 05-Sep-1955 [id number]   [facility] [ACO] Patient: Alexander Banks [employer]  [facility] Liaison remote coverage for [facility] for review and community follow up needs.   Primary Care Provider: [name], MD [place] is a provider that is listed for the [facility] follow up needs     Patient screened for less than 30 days readmission hospitalization with noted high risk score for unplanned readmission risk.  Reviewed to assess for potential [place] [facility] service needs for post hospital transition for readmission prevention needs.  Review of patient's medical record reveals patient for home. Unable to reach by phone at this time.   Plan:  Continue to follow progress and disposition to assess for post hospital care management needs.  Referral request made for post hospital follow up community support.   For questions contact:    Natividad Brood, RN BSN [place]  [phone] business mobile phone Toll free office [phone]  *[place]  [phone] Fax number: [phone] [email or web address]

## 2022-03-07 NOTE — TOC Transition Note (Signed)
Transition of Care [facility]) - CM/SW Discharge Note   Patient Details  Name: Alexander Banks MRN: [id number] Date of Birth: April 10, 1956  [facility]) CM/SW Contact:  Boneta Lucks, RN Phone Number: 03/07/2022, 4:25 PM   Clinical Narrative:   Patient discharging home. [place] Palliative updated, so they can reschedule first home visit.     Final next level of care: Home/Self Care Barriers to Discharge: Barriers Resolved   Patient Goals and CMS Choice Patient states their goals for this hospitalization and ongoing recovery are:: return home   Choice offered to / list presented to : Spouse  Discharge Placement      Patient and family notified of of transfer: 03/07/22  Discharge Plan and Services In-house Referral: Clinical Social Work               Readmission Risk Interventions    03/05/2022    9:06 AM  Readmission Risk Prevention Plan  Transportation Screening Complete  HRI or Home Care Consult Complete  Social Work Consult for [place] Planning/Counseling Complete  Palliative Care Screening Not Applicable  Medication Review [employer]) Complete

## 2022-03-07 NOTE — Progress Notes (Signed)
Pt ambulated with assistance to bathroom this shift and had large bowel movement x1 and has been using urinal at bedside with no issues. No c/o pain or discomfort. All needs met, call bell in reach. Will continue to monitor.

## 2022-03-07 NOTE — Discharge Summary (Signed)
Alexander Banks, is a 66 y.o. male  DOB 21-Aug-1955  MRN [id number].  Admission date:  03/04/2022  Admitting Physician  Bethena Roys, MD  Discharge Date:  03/07/2022   Primary MD  [name], MD  Recommendations for primary care physician for things to follow:  1)Right-sided upper abdomen (biliary) JP drain has been capped as  per Dr Earleen Newport from interventional radiology  2)Please call or return if nausea, vomiting, abdominal pain or fevers or chills 3)You will get a call from interventional radiology scheduler for time and date of drain exchange/eval within the next 1-2 weeks.  Dr Earleen Newport from interventional radiology is speaking to Dr Delton Coombes (from oncology )about plan 4)Repeat CMP and CBC blood test in 5 to 7 days advised 5)Avoid ibuprofen/Advil/Aleve/Motrin/Goody Powders/Naproxen/BC powders/Meloxicam/Diclofenac/Indomethacin and other Nonsteroidal anti-inflammatory medications as these will make you more likely to bleed and can cause stomach ulcers, can also cause Kidney problems.  6)Take Lactulose as prescribed with goal of having at least 1 bowel movement each day---  Admission Diagnosis  Dehydration [E86.0] Hyperkalemia [E87.5] AKI (acute kidney injury) ([place]) [N17.9]   Discharge Diagnosis  Dehydration [E86.0] Hyperkalemia [E87.5] AKI (acute kidney injury) ([place]) [N17.9]    Principal Problem:   AKI (acute kidney injury) ([place]) Active Problems:   Hyponatremia   Hyperkalemia   Obstructive sleep apnea   Pancreatic adenocarcinoma ([place])   Port-A-Cath in place   Exocrine pancreatic insufficiency   Diabetes mellitus secondary to pancreatic insufficiency (HCC)   Hypotension   DVT (deep venous thrombosis) ([place])      Past Medical History:  Diagnosis Date   Anemia, iron deficiency    Diabetes mellitus without complication ([place])    on meds   Hearing loss    Bil hearing aids    Hyperlipidemia    Hypertension    Port-A-Cath in place 12/26/2020   Prediabetes    Sleep apnea     Past Surgical History:  Procedure Laterality Date   BILIARY STENT PLACEMENT N/A 11/09/2020   Procedure: BILIARY STENT PLACEMENT - 50 F X 5 CM STENT;  Surgeon: Rogene Houston, MD;  Location: [facility];  Service: Endoscopy;  Laterality: N/A;   BILIARY STENT PLACEMENT N/A 12/27/2020   Procedure: BILIARY STENT PLACEMENT;  Surgeon: Rogene Houston, MD;  Location: [facility];  Service: Endoscopy;  Laterality: N/A;   COLONOSCOPY     ENTEROSCOPY N/A 12/27/2021   Procedure: ENTEROSCOPY;  Surgeon: Mansouraty, Telford Nab., MD;  Location: [facility] ENDOSCOPY;  Service: Gastroenterology;  Laterality: N/A;   ERCP N/A 11/09/2020   Procedure: ENDOSCOPIC RETROGRADE CHOLANGIOPANCREATOGRAPHY (ERCP);  Surgeon: Rogene Houston, MD;  Location: [facility];  Service: Endoscopy;  Laterality: N/A;   ERCP N/A 12/27/2020   Procedure: ENDOSCOPIC RETROGRADE CHOLANGIOPANCREATOGRAPHY (ERCP);  Surgeon: Rogene Houston, MD;  Location: [facility];  Service: Endoscopy;  Laterality: N/A;   ESOPHAGOGASTRODUODENOSCOPY (EGD) WITH PROPOFOL N/A 11/30/2020   Procedure: ESOPHAGOGASTRODUODENOSCOPY (EGD) WITH PROPOFOL;  Surgeon: Milus Banister, MD;  Location: [facility] ENDOSCOPY;  Service: Endoscopy;  Laterality: N/A;  EUS N/A 11/30/2020   Procedure: UPPER ENDOSCOPIC ULTRASOUND (EUS) RADIAL;  Surgeon: Milus Banister, MD;  Location: [facility] ENDOSCOPY;  Service: Endoscopy;  Laterality: N/A;   FINE NEEDLE ASPIRATION N/A 11/30/2020   Procedure: FINE NEEDLE ASPIRATION (FNA) LINEAR;  Surgeon: Milus Banister, MD;  Location: [facility] ENDOSCOPY;  Service: Endoscopy;  Laterality: N/A;   GIVENS CAPSULE STUDY     IR BILIARY DRAIN PLACEMENT WITH CHOLANGIOGRAM  02/04/2022   IR IMAGING GUIDED PORT INSERTION  12/18/2020   KNEE SURGERY     4 surgeries on left, 2 on right knee   SHOULDER SURGERY     right shoulder   SPHINCTEROTOMY N/A 11/09/2020   Procedure: SHORT BILIARY  SPHINCTEROTOMY;  Surgeon: Rogene Houston, MD;  Location: [facility];  Service: Endoscopy;  Laterality: N/A;   WHIPPLE PROCEDURE       HPI  from the history and physical done on the day of admission:    Chief Complaint: Hypotension   HPI: ABEL HAGEMAN is a 66 y.o. male with medical history significant for  DM, CKD, HTN, Pancreatic Cancer and insufficiency. Patient was sent to the ED from oncologist office with reports of low blood pressure,-53/34.  Patient reports lightheadedness, weakness, no falls. He has had poor oral intake for several days to weeks, no nausea, no vomiting, no diarrhea.  He has chronic back pain related to his pancreatic cancer that is unchanged. No fevers no chills.  No difficulty breathing no cough.  No urinary symptoms.   ED Course: Blood pressure 67/51 in the ED improved to low 100s after 2 L bolus.  Tmax 98.2.  Heart rate 81-93.  Respirate rate 18-24.  O2 sats greater than 92% on room air. Sodium 120.  Potassium elevated at 6.8.  Creatinine elevated 4.5.  WBC 11.9. Lokelma 10 g, NovoLog 5 units, and calcium gluconate 1 g given, DuoNebs also given. Hospitalist to admit for AKI, hypotension and hyperkalemia.   Review of Systems: As per HPI all other systems reviewed and negative.   Hospital Course:    Assessment and Plan: AKI (acute kidney injury) ([place]) -Creatinine elevated at 4.5 at time of admission. -Patient with component of chronic kidney disease stage IIIa at baseline. -Appears to be in the setting of hypertension, hyponatremia and prerenal azotemia. -Continue fluid resuscitation -Creatinine is improved significantly down to 1.4 from 4.5 -Random cortisol level 33.9 which is normal   Hyponatremia -In the setting of decreased oral intake and hypovolemia/dehydration -Sodium level at time of admission 120 -Continue fluid resuscitation -Cortisol level as above #1 -Sodium improved from 120 to 129 which is close to prior baseline -Urine/serum osmolality  suggesting hypovolemia. -Patient advised to maintain adequate oral intake and hydration.     Hyperkalemia -Potassium of 6.8.  Likely due to acute kidney injury. -Continue telemetry monitoring -Repeat calcium gluconate, Lokelma and start bicarbonate. -Patient will also receive insulin. -Provide fluid resuscitation and follow electrolytes trend. -Potassium 4.6 currently.     DVT (deep venous thrombosis) (HCC) -Continue Eliquis -Dose adjusted in the setting of renal failure.   Hypotension  Likely from poor oral intake.   -no signs or source of of infection -Chest x-ray without acute cardiopulmonary process. -UA not suggesting UTI. -Random cortisol is 33.9 which is not low -BP improved with hydration    Diabetes mellitus secondary to pancreatic insufficiency (HCC) -Controlled.  A1c 5.6. -Reflecting excellent diabetic control PTA -Continue home insulin regimen   Exocrine pancreatic insufficiency -Continue Creon. -Patient was started on Ensure and  diet liberalized -Instructed to increase oral nutrition.   Port-A-Cath in place -Continue to provide care -No signs of superimposed infection currently.   Pancreatic adenocarcinoma ([place]) -Follows with [name].  Status post Whipple procedure, chemotherapy.  -Recent hospitalization 9/15 to 9/20 for obstructive jaundice due to recurrent pancreatic cancer, choledocholithiasis, underwent right PTC and placement of biliary drain by IR.  Per recent oncology notes -looking into clinical trials, currently not on therapy. -continue outpatient follow up collagen service -Guarded prognosis. --IR provider was able to cap His biliary drain prior to discharge today --- follow-up with interventional radiology and [name] for management of biliary drain and pancreatic adenocarcinoma   Obstructive sleep apnea -continue oxygen saturation as needed  -not currently using CPAP.   Constipation---improved with lactulose   Discharge  Condition: stable  Follow UP   Follow-up Information     Corrie Mckusick, DO. Call in 2 week(s).   Specialties: Interventional Radiology, Radiology Why: Interventional radiology Contact information: [address] [place] [address] [phone]                 Consults obtained -interventional radiology  Diet and Activity recommendation:  As advised  Discharge Instructions    Discharge Instructions     Call MD for:  difficulty breathing, headache or visual disturbances   Complete by: As directed    Call MD for:  difficulty breathing, headache or visual disturbances   Complete by: As directed    Call MD for:  persistant dizziness or light-headedness   Complete by: As directed    Call MD for:  persistant dizziness or light-headedness   Complete by: As directed    Call MD for:  persistant nausea and vomiting   Complete by: As directed    Call MD for:  persistant nausea and vomiting   Complete by: As directed    Call MD for:  severe uncontrolled pain   Complete by: As directed    Call MD for:  severe uncontrolled pain   Complete by: As directed    Call MD for:  temperature >100.4   Complete by: As directed    Call MD for:  temperature >100.4   Complete by: As directed    Diet - low sodium heart healthy   Complete by: As directed    Diet - low sodium heart healthy   Complete by: As directed    Discharge instructions   Complete by: As directed    1)Right-sided upper abdomen (biliary) JP drain has been capped as  per Dr Earleen Newport from interventional radiology  2)Please call or return if nausea, vomiting, abdominal pain or fevers or chills 3)You will get a call from interventional radiology scheduler for time and date of drain exchange/eval within the next 1-2 weeks.  Dr Earleen Newport from interventional radiology is speaking to Dr Delton Coombes (from oncology )about plan 4)Repeat CMP and CBC blood test in 5 to 7 days advised 5)Avoid  ibuprofen/Advil/Aleve/Motrin/Goody Powders/Naproxen/BC powders/Meloxicam/Diclofenac/Indomethacin and other Nonsteroidal anti-inflammatory medications as these will make you more likely to bleed and can cause stomach ulcers, can also cause Kidney problems.  6)Take Lactulose as prescribed with goal of having at least 1 bowel movement each day---   Discharge instructions   Complete by: As directed    1)Right-sided upper abdomen (biliary) JP drain has been capped as  per Dr Earleen Newport from interventional radiology  2)Please call or return if nausea, vomiting, abdominal pain or fevers or chills 3)You will get a call from  interventional radiology scheduler for time and date of drain exchange/eval within the next 1-2 weeks.  Dr Earleen Newport from interventional radiology is speaking to Dr Delton Coombes (from oncology )about plan 4)Repeat CMP and CBC blood test in 5 to 7 days advised 5)Avoid ibuprofen/Advil/Aleve/Motrin/Goody Powders/Naproxen/BC powders/Meloxicam/Diclofenac/Indomethacin and other Nonsteroidal anti-inflammatory medications as these will make you more likely to bleed and can cause stomach ulcers, can also cause Kidney problems.  6)Take Lactulose as prescribed with goal of having at least 1 bowel movement each day---   Increase activity slowly   Complete by: As directed    Increase activity slowly   Complete by: As directed          Discharge Medications     Allergies as of 03/07/2022       Reactions   Lisinopril Cough        Medication List     STOP taking these medications    docusate sodium 100 MG capsule Commonly known as: COLACE       TAKE these medications    apixaban 5 MG Tabs tablet Commonly known as: ELIQUIS Take 1 tablet (5 mg total) by mouth 2 (two) times daily.   azelastine 0.1 % nasal spray Commonly known as: ASTELIN Place 2 sprays into both nostrils 2 (two) times daily. What changed:  when to take this reasons to take this   feeding supplement Liqd Take  237 mLs by mouth 2 (two) times daily between meals. Start taking on: March 08, 2022   FreeStyle Libre 3 Sensor Misc 1 Piece by Does not apply route every 14 (fourteen) days. Place 1 sensor on the skin every 14 days. Use to check glucose continuously Dx E11.65   insulin detemir 100 UNIT/ML FlexPen Commonly known as: LEVEMIR Inject 6 Units into the skin at bedtime.   insulin lispro 100 UNIT/ML KwikPen Commonly known as: HumaLOG KwikPen Inject 4-7 Units into the skin 3 (three) times daily before meals.   lactulose 10 GM/15ML solution Commonly known as: CHRONULAC Take 30 mLs (20 g total) by mouth daily.   levothyroxine 175 MCG tablet Commonly known as: SYNTHROID TAKE 1 TABLET DAILY What changed: when to take this   lipase/protease/amylase 36000 UNITS Cpep capsule Commonly known as: Creon Take 2 capsules (72,000 Units total) by mouth 3 (three) times daily with meals. May also take 1 capsule (36,000 Units total) as needed (with snacks). What changed: See the new instructions.   Normal Saline Flush 0.9 % Soln 5-10 mLs by Intracatheter route daily. Flush biliary drain with 5 mL normal saline everyday.   oxyCODONE 5 MG immediate release tablet Commonly known as: Oxy IR/ROXICODONE Take 1 tablet (5 mg total) by mouth every 4 (four) hours as needed for severe pain.   sennosides-docusate sodium 8.6-50 MG tablet Commonly known as: SENOKOT-S Take 1 tablet by mouth daily.   sildenafil 100 MG tablet Commonly known as: VIAGRA TAKE 1/2 TO 1 TABLET BY MOUTH AS NEEDED FOR ERECTILE DYSFUNCTION What changed: See the new instructions.   sodium bicarbonate 650 MG tablet Take 1 tablet (650 mg total) by mouth 2 (two) times daily.   tamsulosin 0.4 MG Caps capsule Commonly known as: FLOMAX Take 1 capsule (0.4 mg total) by mouth daily after supper.        Major procedures and Radiology Reports - PLEASE review detailed and final reports for all details, in brief -   DG CHEST PORT 1  VIEW  Result Date: 03/04/2022 CLINICAL DATA:  Hypotension EXAM: PORTABLE CHEST 1 VIEW  COMPARISON:  Radiographs 12/11/2021 FINDINGS: No focal consolidation, pleural effusion, or pneumothorax. Normal cardiomediastinal silhouette. No acute osseous abnormality. Remote left rib fracture. Accessed right chest wall Port-A-Cath with tip in the mid SVC. IMPRESSION: No active disease. Electronically Signed   By: Placido Sou M.D.   On: 03/04/2022 19:30   NM PET Image Restag (PS) Skull Base To Thigh  Result Date: 02/24/2022 CLINICAL DATA:  Subsequent treatment strategy for pancreatic cancer. EXAM: NUCLEAR MEDICINE PET SKULL BASE TO THIGH TECHNIQUE: 8.49 mCi F-18 FDG was injected intravenously. Full-ring PET imaging was performed from the skull base to thigh after the radiotracer. CT data was obtained and used for attenuation correction and anatomic localization. Fasting blood glucose: 187 mg/dl COMPARISON:  Multiple prior CT scans. The most recent is 01/29/2022 FINDINGS: Mediastinal blood pool activity: SUV max 2.44 Liver activity: SUV max NA NECK: No hypermetabolic lymph nodes in the neck. Incidental CT findings: Mild uniform hypermetabolism in the thyroid gland, likely reflecting thyroiditis. No thyroid nodules. CHEST: No hypermetabolic mediastinal or hilar nodes. No suspicious pulmonary nodules on the CT scan. Incidental CT findings: Stable emphysematous changes and three-vessel coronary artery calcifications. ABDOMEN/PELVIS: Since the prior CT scan the patient has had a external biliary drainage catheter placed. No persistent intrahepatic biliary dilatation. Small amount of pneumobilia, not unexpected. The catheter is in good position without complicating features. Focal area hypermetabolism is noted in the peripheral aspect of segment 5 of the liver difficult to identify definite hepatic lesion on the CT scan but this is certainly worrisome for a new hepatic metastatic focus. SUV max is 7.53. As demonstrated on  the prior CT scan there are peripancreatic nodes demonstrating hypermetabolism. Periportal node has an SUV max of 5.17. The node near the superior mesenteric artery has an SUV max of 5.04. No retroperitoneal adenopathy. The adrenal glands are unremarkable. Incidental CT findings: Stable aortic and iliac artery calcifications. SKELETON: No findings for osseous metastatic disease. Incidental CT findings: None. IMPRESSION: 1. Hypermetabolic focus in segment 5 of the liver is worrisome for a metastatic lesion. This is difficult to see on the CT scan. 2. Decompression of the biliary system within externalized drainage catheter. No complicating features. 3. Hypermetabolic upper abdominal lymph nodes as above. 4. No findings for metastatic disease involving the chest or bony structures. 5. Stable age advanced vascular disease. Electronically Signed   By: Marijo Sanes M.D.   On: 02/24/2022 10:15   US Abdomen Complete  Result Date: 02/14/2022 CLINICAL DATA:  Pancreatic cancer.  Abdominal pain. EXAM: ABDOMEN ULTRASOUND COMPLETE COMPARISON:  CT scan 01/29/2022 FINDINGS: Gallbladder: Surgically absent. Common bile duct: Diameter: Biliary drainage catheter in place. Liver: Diffuse increased echogenicity. Mild intrahepatic biliary dilatation. No focal hepatic lesions. Portal vein is patent on color Doppler imaging with normal direction of blood flow towards the liver. IVC: Normal caliber. Pancreas: Not visualized. Spleen: Normal size.  No focal lesions. Right Kidney: Length: 11.0 cm. Normal renal cortical thickness and echogenicity. No hydronephrosis. Small midpole renal cyst. Left Kidney: Length: 11.4 cm. Normal renal cortical thickness and echogenicity without focal lesions or hydronephrosis. Abdominal aorta: Normal caliber Other findings: None. IMPRESSION: 1. Status post cholecystectomy with biliary drainage catheter in place. 2. Diffuse increased echogenicity of the liver most likely due to steatosis. 3. Mild  intrahepatic biliary dilatation. 4. Poor visualization of the pancreas. 5. Normal spleen and both kidneys. Electronically Signed   By: Marijo Sanes M.D.   On: 02/14/2022 10:02    Micro Results  Recent Results (from the  past 240 hour(s))  SARS Coronavirus 2 by RT PCR (hospital order, performed in [facility] hospital lab) *cepheid single result test* Anterior Nasal Swab     Status: None   Collection Time: 03/04/22  2:01 PM   Specimen: Anterior Nasal Swab  Result Value Ref Range Status   SARS Coronavirus 2 by RT PCR NEGATIVE NEGATIVE Final    Comment: (NOTE) SARS-CoV-2 target nucleic acids are NOT DETECTED.  The SARS-CoV-2 RNA is generally detectable in upper and lower respiratory specimens during the acute phase of infection. The lowest concentration of SARS-CoV-2 viral copies this assay can detect is 250 copies / mL. A negative result does not preclude SARS-CoV-2 infection and should not be used as the sole basis for treatment or other patient management decisions.  A negative result may occur with improper specimen collection / handling, submission of specimen other than nasopharyngeal swab, presence of viral mutation(s) within the areas targeted by this assay, and inadequate number of viral copies (<250 copies / mL). A negative result must be combined with clinical observations, patient history, and epidemiological information.  Fact Sheet for Patients:   https://www.patel.info/  Fact Sheet for Healthcare Providers: https://hall.com/  This test is not yet approved or  cleared by the [place] FDA and has been authorized for detection and/or diagnosis of SARS-CoV-2 by FDA under an Emergency Use Authorization (EUA).  This EUA will remain in effect (meaning this test can be used) for the duration of the COVID-19 declaration under Section 564(b)(1) of the Act, 21 U.S.C. section 360bbb-3(b)(1), unless the authorization is terminated  or revoked sooner.  Performed at [facility], [address] [place], [place] [address]   Culture, blood (single) w Reflex to ID Panel     Status: None (Preliminary result)   Collection Time: 03/04/22  7:18 PM   Specimen: Left Antecubital; Blood  Result Value Ref Range Status   Specimen Description LEFT ANTECUBITAL  Final   Special Requests   Final    BOTTLES DRAWN AEROBIC AND ANAEROBIC Blood Culture results may not be optimal due to an excessive volume of blood received in culture bottles   Culture   Final    NO GROWTH 3 DAYS Performed at [facility], [address] [place], [place] [address]    Report Status PENDING  Incomplete  MRSA Next Gen by PCR, Nasal     Status: None   Collection Time: 03/05/22  5:58 AM   Specimen: Nasal Mucosa; Nasal Swab  Result Value Ref Range Status   MRSA by PCR Next Gen NOT DETECTED NOT DETECTED Final    Comment: (NOTE) The GeneXpert MRSA Assay (FDA approved for NASAL specimens only), is one component of a comprehensive MRSA colonization surveillance program. It is not intended to diagnose MRSA infection nor to guide or monitor treatment for MRSA infections. Test performance is not FDA approved in patients less than 75 years old. Performed at [facility], [address] [place], [place] [address]    Today   Subjective    Iley Breeden today has no new complaints -Wife at bedside -Eating and drinking better -IR provider was able to cap His biliary drain No fever  Or chills  -No nausea or vomiting   Patient has been seen and examined prior to discharge   Objective   Blood pressure 106/69, pulse 81, temperature 98.5 F (36.9 C), temperature source Oral, resp. rate 17, height 6' 2.5" (1.892 m), weight 69.6 kg, SpO2 100 %.   Intake/Output Summary (Last  24 hours) at 03/07/2022 1531 Last data filed at 03/07/2022 1500 Gross per 24 hour  Intake 1410.83 ml  Output 1765 ml  Net -354.17 ml   Exam Gen:- Awake Alert, no acute distress ,  frail and cachectic appearing HEENT:- [place].AT, No sclera icterus Neck-Supple Neck,No JVD,.  Lungs-  CTAB , good air movement bilaterally CV- S1, S2 normal, regular,Port-A-Cath in the right upper chest appreciated. Abd-  +ve B.Sounds, Abd Soft, No tenderness, Biliary drain to right lateral side upper abdomen, clean dressings in place.  There was no surrounding erythema or suggestion for superimposed infection on exam. Extremity/Skin:- No  edema,   good pulses Psych-affect is appropriate, oriented x3 Neuro-no new focal deficits, no tremors    Data Review   CBC w Diff:  Lab Results  Component Value Date   WBC 10.3 03/05/2022   HGB 13.2 03/05/2022   HGB 11.3 (L) 02/12/2022   HCT 39.7 03/05/2022   HCT 33.0 (L) 02/12/2022   PLT 235 03/05/2022   PLT 544 (H) 02/12/2022   LYMPHOPCT 10 03/04/2022   MONOPCT 13 03/04/2022   EOSPCT 0 03/04/2022   BASOPCT 0 03/04/2022   CMP:  Lab Results  Component Value Date   NA 129 (L) 03/06/2022   NA 135 02/12/2022   K 4.6 03/06/2022   CL 104 03/06/2022   CO2 18 (L) 03/06/2022   BUN 50 (H) 03/06/2022   BUN 12 02/12/2022   CREATININE 1.42 (H) 03/06/2022   CREATININE 1.10 05/31/2013   PROT 8.1 03/04/2022   PROT 6.4 02/12/2022   ALBUMIN 3.4 (L) 03/04/2022   ALBUMIN 2.9 (L) 02/12/2022   BILITOT 3.1 (H) 03/04/2022   BILITOT 5.9 (H) 02/12/2022   ALKPHOS 122 03/04/2022   AST 27 03/04/2022   ALT 44 03/04/2022   Total Discharge time is about 33 minutes  Roxan Hockey M.D on 03/07/2022 at 3:31 PM  Go to [email or web address] -  for contact info  [facility] - Office  [phone]

## 2022-03-07 NOTE — Consult Note (Signed)
Chief Complaint: Patient was seen in consultation today for biliary drain evaluation Chief Complaint  Patient presents with   Hypotension   at the request of Dr Maurene Capes  Referring Physician(s): Dr Delton Coombes  Supervising Physician: Corrie Mckusick  Patient Status: AP IP  History of Present Illness: Alexander Banks is a 66 y.o. male    Known to IR Pancreatic cancer; post Whipple surgery Cancer recurrence and biliary obstruction Biliary drain placed I IR 02/04/22  Has been at home with wife as caretaker She states OP of drain is at least 1 liter /day  He came to ED 10/16 with weakness; hypotension Poor po intake Denies falls at home Denies N/V/D Found to have Cr 4.5--- now 1.4 in house Hyponatremia; hyperkalemia  IR procedure 9/18: IMPRESSION: 1. Successful percutaneous transhepatic cholangiogram with placement of an internal/external biliary drainage catheter.   PLAN: 1. Maintain biliary drain to gravity bag in till there has been a meaningful down trend in serum bilirubin. 2. When serum bilirubin is clearly improving, the drain may be capped. 3. Record drain output and follow serum electrolytes. Hydrate and replete electrolytes as needed.   TRH requesting IR evaluation of Biliary drain and needs     Past Medical History:  Diagnosis Date   Anemia, iron deficiency    Diabetes mellitus without complication ([place])    on meds   Hearing loss    Bil hearing aids   Hyperlipidemia    Hypertension    Port-A-Cath in place 12/26/2020   Prediabetes    Sleep apnea     Past Surgical History:  Procedure Laterality Date   BILIARY STENT PLACEMENT N/A 11/09/2020   Procedure: BILIARY STENT PLACEMENT - 74 F X 5 CM STENT;  Surgeon: Rogene Houston, MD;  Location: [facility];  Service: Endoscopy;  Laterality: N/A;   BILIARY STENT PLACEMENT N/A 12/27/2020   Procedure: BILIARY STENT PLACEMENT;  Surgeon: Rogene Houston, MD;  Location: [facility];  Service: Endoscopy;   Laterality: N/A;   COLONOSCOPY     ENTEROSCOPY N/A 12/27/2021   Procedure: ENTEROSCOPY;  Surgeon: Mansouraty, Telford Nab., MD;  Location: [facility] ENDOSCOPY;  Service: Gastroenterology;  Laterality: N/A;   ERCP N/A 11/09/2020   Procedure: ENDOSCOPIC RETROGRADE CHOLANGIOPANCREATOGRAPHY (ERCP);  Surgeon: Rogene Houston, MD;  Location: [facility];  Service: Endoscopy;  Laterality: N/A;   ERCP N/A 12/27/2020   Procedure: ENDOSCOPIC RETROGRADE CHOLANGIOPANCREATOGRAPHY (ERCP);  Surgeon: Rogene Houston, MD;  Location: [facility];  Service: Endoscopy;  Laterality: N/A;   ESOPHAGOGASTRODUODENOSCOPY (EGD) WITH PROPOFOL N/A 11/30/2020   Procedure: ESOPHAGOGASTRODUODENOSCOPY (EGD) WITH PROPOFOL;  Surgeon: Milus Banister, MD;  Location: [facility] ENDOSCOPY;  Service: Endoscopy;  Laterality: N/A;   EUS N/A 11/30/2020   Procedure: UPPER ENDOSCOPIC ULTRASOUND (EUS) RADIAL;  Surgeon: Milus Banister, MD;  Location: [facility] ENDOSCOPY;  Service: Endoscopy;  Laterality: N/A;   FINE NEEDLE ASPIRATION N/A 11/30/2020   Procedure: FINE NEEDLE ASPIRATION (FNA) LINEAR;  Surgeon: Milus Banister, MD;  Location: [facility] ENDOSCOPY;  Service: Endoscopy;  Laterality: N/A;   GIVENS CAPSULE STUDY     IR BILIARY DRAIN PLACEMENT WITH CHOLANGIOGRAM  02/04/2022   IR IMAGING GUIDED PORT INSERTION  12/18/2020   KNEE SURGERY     4 surgeries on left, 2 on right knee   SHOULDER SURGERY     right shoulder   SPHINCTEROTOMY N/A 11/09/2020   Procedure: SHORT BILIARY SPHINCTEROTOMY;  Surgeon: Rogene Houston, MD;  Location: [facility];  Service: Endoscopy;  Laterality: N/A;  WHIPPLE PROCEDURE      Allergies: Lisinopril  Medications: Prior to Admission medications   Medication Sig Start Date End Date Taking? Authorizing Provider  apixaban (ELIQUIS) 5 MG TABS tablet Take 1 tablet (5 mg total) by mouth 2 (two) times daily. 12/19/21  Yes Luking, Elayne Snare, MD  azelastine (ASTELIN) 0.1 % nasal spray Place 2 sprays into both nostrils 2 (two) times daily. Patient  taking differently: Place 2 sprays into both nostrils 2 (two) times daily as needed for allergies. 11/23/21  Yes Luking, Elayne Snare, MD  docusate sodium (COLACE) 100 MG capsule Take 100 mg by mouth daily.   Yes [provider]  insulin detemir (LEVEMIR) 100 UNIT/ML FlexPen Inject 6 Units into the skin at bedtime. 02/13/22  Yes Nida, Marella Chimes, MD  insulin lispro (HUMALOG KWIKPEN) 100 UNIT/ML KwikPen Inject 4-7 Units into the skin 3 (three) times daily before meals. 02/13/22  Yes Cassandria Anger, MD  levothyroxine (SYNTHROID) 175 MCG tablet TAKE 1 TABLET DAILY Patient taking differently: Take 175 mcg by mouth daily before breakfast. 10/17/21  Yes Luking, Elayne Snare, MD  lipase/protease/amylase (CREON) 36000 UNITS CPEP capsule Take 2 capsules (72,000 Units total) by mouth 3 (three) times daily with meals. May also take 1 capsule (36,000 Units total) as needed (with snacks). Patient taking differently: Take 1 capsule (36,000 Units total) by mouth 3 (three) times daily with meals. May also take 1 capsule (36,000 Units total) as needed (with snacks). 05/31/21  Yes Luking, Elayne Snare, MD  oxyCODONE (OXY IR/ROXICODONE) 5 MG immediate release tablet Take 1 tablet (5 mg total) by mouth every 4 (four) hours as needed for severe pain. 03/04/22  Yes Luking, Elayne Snare, MD  sennosides-docusate sodium (SENOKOT-S) 8.6-50 MG tablet Take 1 tablet by mouth daily.   Yes [provider]  sildenafil (VIAGRA) 100 MG tablet TAKE 1/2 TO 1 TABLET BY MOUTH AS NEEDED FOR ERECTILE DYSFUNCTION Patient taking differently: Take 50-100 mg by mouth as needed for erectile dysfunction. 12/03/21  Yes Luking, Scott A, MD  sodium chloride flush (NS) 0.9 % SOLN 5-10 mLs by Intracatheter route daily. Flush biliary drain with 5 mL normal saline everyday. 02/06/22 03/08/22 Yes Han, Aimee H, PA-C  Continuous Blood Gluc Sensor (FREESTYLE LIBRE 3 SENSOR) MISC 1 Piece by Does not apply route every 14 (fourteen) days. Place 1 sensor on the  skin every 14 days. Use to check glucose continuously Dx E11.65 10/22/21   Cassandria Anger, MD     Family History  Problem Relation Age of Onset   Heart attack Father    Heart attack Paternal Grandfather    Leukemia Sister    Heart attack Brother     Social History   Socioeconomic History   Marital status: Married    Spouse name: Not on file   Number of children: Not on file   Years of education: Not on file   Highest education level: Not on file  Occupational History   Not on file  Tobacco Use   Smoking status: Every Day    Types: Cigars   Smokeless tobacco: Never   Tobacco comments:    Smoke 3-4 cigars a day  Vaping Use   Vaping Use: Never used  Substance and Sexual Activity   Alcohol use: Yes    Comment: occasional   Drug use: Never   Sexual activity: Not on file  Other Topics Concern   Not on file  Social History Narrative   Not on file  Social Determinants of Health   Financial Resource Strain: Low Risk  (12/20/2020)   Overall Financial Resource Strain (CARDIA)    Difficulty of Paying Living Expenses: Not hard at all  Food Insecurity: No Food Insecurity (02/02/2022)   Hunger Vital Sign    Worried About Running Out of Food in the Last Year: Never true    Ran Out of Food in the Last Year: Never true  Transportation Needs: No Transportation Needs (02/02/2022)   PRAPARE - Hydrologist (Medical): No    Lack of Transportation (Non-Medical): No  Physical Activity: Sufficiently Active (12/20/2020)   Exercise Vital Sign    Days of Exercise per Week: 5 days    Minutes of Exercise per Session: 30 min  Recent Concern: Physical Activity - Inactive (11/17/2020)   Exercise Vital Sign    Days of Exercise per Week: 0 days    Minutes of Exercise per Session: 0 min  Stress: Not on file  Social Connections: Not on file     Review of Systems: A 12 point ROS discussed and pertinent positives are indicated in the HPI above.  All other systems  are negative.  Review of Systems  Constitutional:  Positive for activity change, appetite change, fatigue and unexpected weight change. Negative for fever.  Respiratory:  Negative for cough and shortness of breath.   Cardiovascular:  Negative for chest pain.  Gastrointestinal:  Negative for abdominal pain, diarrhea, nausea and vomiting.  Musculoskeletal:  Negative for back pain and gait problem.  Neurological:  Positive for weakness and light-headedness.  Psychiatric/Behavioral:  Negative for behavioral problems and confusion.     Vital Signs: BP 106/69 (BP Location: Left Arm)   Pulse 81   Temp 98.5 F (36.9 C) (Oral)   Resp 17   Ht 6' 2.5" (1.892 m)   Wt 153 lb 7 oz (69.6 kg)   SpO2 100%   BMI 19.44 kg/m     Physical Exam Vitals reviewed.  Constitutional:      Comments: Thin male  HENT:     Mouth/Throat:     Mouth: Mucous membranes are moist.  Cardiovascular:     Rate and Rhythm: Normal rate and regular rhythm.     Heart sounds: Normal heart sounds.  Pulmonary:     Effort: Pulmonary effort is normal.     Breath sounds: Normal breath sounds.  Abdominal:     Palpations: Abdomen is soft.     Tenderness: There is no abdominal tenderness.  Musculoskeletal:        General: Normal range of motion.  Skin:    General: Skin is warm.     Coloration: Skin is not jaundiced.     Comments: Site of drain is c/d/I NT no bleeding No sign of infection  OP bile fluid 300 cc today 950 cc totaled yesterday Flushes easily  Neurological:     Mental Status: He is alert and oriented to person, place, and time.  Psychiatric:        Behavior: Behavior normal.     Imaging: DG CHEST PORT 1 VIEW  Result Date: 03/04/2022 CLINICAL DATA:  Hypotension EXAM: PORTABLE CHEST 1 VIEW COMPARISON:  Radiographs 12/11/2021 FINDINGS: No focal consolidation, pleural effusion, or pneumothorax. Normal cardiomediastinal silhouette. No acute osseous abnormality. Remote left rib fracture. Accessed  right chest wall Port-A-Cath with tip in the mid SVC. IMPRESSION: No active disease. Electronically Signed   By: Placido Sou M.D.   On: 03/04/2022 19:30  NM PET Image Restag (PS) Skull Base To Thigh  Result Date: 02/24/2022 CLINICAL DATA:  Subsequent treatment strategy for pancreatic cancer. EXAM: NUCLEAR MEDICINE PET SKULL BASE TO THIGH TECHNIQUE: 8.49 mCi F-18 FDG was injected intravenously. Full-ring PET imaging was performed from the skull base to thigh after the radiotracer. CT data was obtained and used for attenuation correction and anatomic localization. Fasting blood glucose: 187 mg/dl COMPARISON:  Multiple prior CT scans. The most recent is 01/29/2022 FINDINGS: Mediastinal blood pool activity: SUV max 2.44 Liver activity: SUV max NA NECK: No hypermetabolic lymph nodes in the neck. Incidental CT findings: Mild uniform hypermetabolism in the thyroid gland, likely reflecting thyroiditis. No thyroid nodules. CHEST: No hypermetabolic mediastinal or hilar nodes. No suspicious pulmonary nodules on the CT scan. Incidental CT findings: Stable emphysematous changes and three-vessel coronary artery calcifications. ABDOMEN/PELVIS: Since the prior CT scan the patient has had a external biliary drainage catheter placed. No persistent intrahepatic biliary dilatation. Small amount of pneumobilia, not unexpected. The catheter is in good position without complicating features. Focal area hypermetabolism is noted in the peripheral aspect of segment 5 of the liver difficult to identify definite hepatic lesion on the CT scan but this is certainly worrisome for a new hepatic metastatic focus. SUV max is 7.53. As demonstrated on the prior CT scan there are peripancreatic nodes demonstrating hypermetabolism. Periportal node has an SUV max of 5.17. The node near the superior mesenteric artery has an SUV max of 5.04. No retroperitoneal adenopathy. The adrenal glands are unremarkable. Incidental CT findings: Stable aortic  and iliac artery calcifications. SKELETON: No findings for osseous metastatic disease. Incidental CT findings: None. IMPRESSION: 1. Hypermetabolic focus in segment 5 of the liver is worrisome for a metastatic lesion. This is difficult to see on the CT scan. 2. Decompression of the biliary system within externalized drainage catheter. No complicating features. 3. Hypermetabolic upper abdominal lymph nodes as above. 4. No findings for metastatic disease involving the chest or bony structures. 5. Stable age advanced vascular disease. Electronically Signed   By: Marijo Sanes M.D.   On: 02/24/2022 10:15   US Abdomen Complete  Result Date: 02/14/2022 CLINICAL DATA:  Pancreatic cancer.  Abdominal pain. EXAM: ABDOMEN ULTRASOUND COMPLETE COMPARISON:  CT scan 01/29/2022 FINDINGS: Gallbladder: Surgically absent. Common bile duct: Diameter: Biliary drainage catheter in place. Liver: Diffuse increased echogenicity. Mild intrahepatic biliary dilatation. No focal hepatic lesions. Portal vein is patent on color Doppler imaging with normal direction of blood flow towards the liver. IVC: Normal caliber. Pancreas: Not visualized. Spleen: Normal size.  No focal lesions. Right Kidney: Length: 11.0 cm. Normal renal cortical thickness and echogenicity. No hydronephrosis. Small midpole renal cyst. Left Kidney: Length: 11.4 cm. Normal renal cortical thickness and echogenicity without focal lesions or hydronephrosis. Abdominal aorta: Normal caliber Other findings: None. IMPRESSION: 1. Status post cholecystectomy with biliary drainage catheter in place. 2. Diffuse increased echogenicity of the liver most likely due to steatosis. 3. Mild intrahepatic biliary dilatation. 4. Poor visualization of the pancreas. 5. Normal spleen and both kidneys. Electronically Signed   By: Marijo Sanes M.D.   On: 02/14/2022 10:02    Labs:  CBC: Recent Labs    02/21/22 0946 02/22/22 0857 03/04/22 1401 03/05/22 0429  WBC 10.3 9.1 11.9* 10.3  HGB  14.2 14.3 13.0 13.2  HCT 43.7 43.3 37.7* 39.7  PLT 402* 384 323 235    COAGS: Recent Labs    12/11/21 1005 02/02/22 2202 02/04/22 0327  INR 1.1  --  1.2  APTT 33 31  --     BMP: Recent Labs    03/04/22 1401 03/04/22 1918 03/05/22 0429 03/05/22 2057 03/06/22 0625  NA 120*  --  127* 129* 129*  K 6.8* 5.6* 5.9* 5.2* 4.6  CL 87*  --  99 102 104  CO2 21*  --  16* 20* 18*  GLUCOSE 115*  --  146* 132* 181*  BUN 78*  --  67* 58* 50*  CALCIUM 9.4  --  8.8* 8.6* 8.3*  CREATININE 4.50*  --  2.92* 1.86* 1.42*  GFRNONAA 14*  --  23* 40* 55*    LIVER FUNCTION TESTS: Recent Labs    02/21/22 0946 02/22/22 0857 02/25/22 0805 03/04/22 1401  BILITOT 4.7* 4.5* 3.7* 3.1*  AST 36 36 36 27  ALT 42 39 40 44  ALKPHOS 224* 209* 164* 122  PROT 8.3* 8.3* 7.9 8.1  ALBUMIN 3.3* 3.2* 3.2* 3.4*    TUMOR MARKERS: No results for input(s): "AFPTM", "CEA", "CA199", "CHROMGRNA" in the last 8760 hours.  Assessment and Plan:  Pancreatic cancer--post Whipple Cancer recurrence and with biliary obstruction Bili drain placed 9/18 Total Bilirubin 3.0 on 10/16 Discussed with Dr Earleen Newport--- Plan: cap trial for now (capped at 1230 pm today) New bag was given to wife for home-- if needs. Replace drain to bag if develops N/V/pain/ fever Pt will hear from IR scheduler for drain evaluation/exchange in next 1-2 weeks Will discuss possible need for stent at that time Dr Earleen Newport is calling Dr Delton Coombes to discuss plan and needs.  Pt and wife have good understanding of plan Dr Denton Brick aware of plan  Thank you for this interesting consult.  I greatly enjoyed meeting DEMARIUS ARCHILA and look forward to participating in their care.  A copy of this report was sent to the requesting provider on this date.  Electronically Signed: Lavonia Drafts, PA-C 03/07/2022, 2:36 PM   I spent a total of 20 Minutes    in face to face in clinical consultation, greater than 50% of which was counseling/coordinating  care for Biliary drain evaluation

## 2022-03-07 NOTE — Discharge Instructions (Signed)
1)Right-sided upper abdomen (biliary) JP drain has been capped as  per Dr Earleen Newport from interventional radiology  2)Please call or return if nausea, vomiting, abdominal pain or fevers or chills 3)You will get a call from interventional radiology scheduler for time and date of drain exchange/eval within the next 1-2 weeks.  Dr Earleen Newport from interventional radiology is speaking to Dr Delton Coombes (from oncology )about plan 4)Repeat CMP and CBC blood test in 5 to 7 days advised 5)Avoid ibuprofen/Advil/Aleve/Motrin/Goody Powders/Naproxen/BC powders/Meloxicam/Diclofenac/Indomethacin and other Nonsteroidal anti-inflammatory medications as these will make you more likely to bleed and can cause stomach ulcers, can also cause Kidney problems.  6)Take Lactulose as prescribed with goal of having at least 1 bowel movement each day---

## 2022-03-08 ENCOUNTER — Ambulatory Visit (HOSPITAL_COMMUNITY)
Admission: RE | Admit: 2022-03-08 | Discharge: 2022-03-08 | Disposition: A | Discharge status: Home / Self Care | Payer: BC Managed Care – PPO | Source: Ambulatory Visit | Attending: Interventional Radiology | Admitting: Interventional Radiology

## 2022-03-08 ENCOUNTER — Other Ambulatory Visit (HOSPITAL_COMMUNITY): Payer: Self-pay | Admitting: Interventional Radiology

## 2022-03-08 ENCOUNTER — Telehealth: Payer: Self-pay | Admitting: *Deleted

## 2022-03-08 ENCOUNTER — Ambulatory Visit (INDEPENDENT_AMBULATORY_CARE_PROVIDER_SITE_OTHER): Payer: BC Managed Care – PPO | Admitting: Family Medicine

## 2022-03-08 ENCOUNTER — Telehealth: Payer: Self-pay | Admitting: Family Medicine

## 2022-03-08 ENCOUNTER — Encounter: Payer: Self-pay | Admitting: Family Medicine

## 2022-03-08 DIAGNOSIS — T85638A Leakage of other specified internal prosthetic devices, implants and grafts, initial encounter: Secondary | ICD-10-CM | POA: Diagnosis not present

## 2022-03-08 DIAGNOSIS — K831 Obstruction of bile duct: Secondary | ICD-10-CM | POA: Diagnosis not present

## 2022-03-08 DIAGNOSIS — R748 Abnormal levels of other serum enzymes: Secondary | ICD-10-CM

## 2022-03-08 DIAGNOSIS — C259 Malignant neoplasm of pancreas, unspecified: Secondary | ICD-10-CM

## 2022-03-08 HISTORY — PX: IR PATIENT EVAL TECH 0-60 MINS: IMG5564

## 2022-03-08 NOTE — Progress Notes (Signed)
Subjective:  Patient ID: Alexander Banks, male    DOB: Dec 16, 1955  Age: 66 y.o. MRN: [id number]  CC: Chief Complaint  Patient presents with   Drainage from Incision    Pt had drain on left side but was stopped at the hospital. Pt rolled over on left side last night. Pt having draining and tenderness on left side.     HPI:  66 year old male presents for evaluation of the above.  Patient recently discharged from the hospital.  See hospital records.  Has underlying pancreatic cancer.  Has a biliary drain in place (was placed by IR).  Wife reports that the drain has been capped and he is now having drainage from the site.  Soaking dressings.  No fever.  He is quite weak.  No other complaints or concerns at this time.  Patient Active Problem List   Diagnosis Date Noted   DVT (deep venous thrombosis) ([place]) 03/04/2022   Hyperkalemia 03/04/2022   Hypotension 12/11/2021   Abnormal weight loss 12/11/2021   Severe protein-calorie malnutrition 12/11/2021   Acquired thrombophilia ([place]) 12/11/2021   Immunocompromised ([place]) 12/11/2021   Exocrine pancreatic insufficiency 10/10/2021   Diabetes mellitus secondary to pancreatic insufficiency ([place]) 10/10/2021   Genetic testing 04/30/2021   History of cholangitis 01/16/2021   Current smoker 01/02/2021   Port-A-Cath in place 12/26/2020   Bile duct obstruction 12/21/2020   Pancreatic adenocarcinoma ([place]) 12/04/2020   Erectile dysfunction 11/17/2020   Mixed hyperlipidemia 11/17/2020   Type 2 diabetes mellitus with stage 3a chronic kidney disease, with long-term current use of insulin ([place]) 11/13/2020   Hyponatremia 11/07/2020   Macrocytic anemia 11/07/2020   AKI (acute kidney injury) ([place]) 11/07/2020   Hypothyroidism 09/27/2014   Obstructive sleep apnea 12/07/2012   Osteoarthritis 12/07/2012   Essential hypertension, benign 10/19/2012    Social Hx   Social History   Socioeconomic History   Marital status: Married    Spouse name: Not on  file   Number of children: Not on file   Years of education: Not on file   Highest education level: Not on file  Occupational History   Not on file  Tobacco Use   Smoking status: Every Day    Types: Cigars   Smokeless tobacco: Never   Tobacco comments:    Smoke 3-4 cigars a day  Vaping Use   Vaping Use: Never used  Substance and Sexual Activity   Alcohol use: Yes    Comment: occasional   Drug use: Never   Sexual activity: Not on file  Other Topics Concern   Not on file  Social History Narrative   Not on file   Social Determinants of Health   Financial Resource Strain: Low Risk  (12/20/2020)   Overall Financial Resource Strain (CARDIA)    Difficulty of Paying Living Expenses: Not hard at all  Food Insecurity: No Food Insecurity (02/02/2022)   Hunger Vital Sign    Worried About Running Out of Food in the Last Year: Never true    Ran Out of Food in the Last Year: Never true  Transportation Needs: No Transportation Needs (02/02/2022)   PRAPARE - Hydrologist (Medical): No    Lack of Transportation (Non-Medical): No  Physical Activity: Sufficiently Active (12/20/2020)   Exercise Vital Sign    Days of Exercise per Week: 5 days    Minutes of Exercise per Session: 30 min  Recent Concern: Physical Activity - Inactive (11/17/2020)   Exercise Vital Sign  Days of Exercise per Week: 0 days    Minutes of Exercise per Session: 0 min  Stress: Not on file  Social Connections: Not on file    Review of Systems Per HPI  Objective:  BP (!) 89/62   Pulse 97   Temp 99 F (37.2 C)   Wt 154 lb 6.4 oz (70 kg)   SpO2 99%   BMI 19.56 kg/m      03/08/2022   11:51 AM 03/07/2022    2:23 PM 03/07/2022    5:43 AM  BP/Weight  Systolic BP 89 277 92  Diastolic BP 62 69 63  Wt. (Lbs) 154.4    BMI 19.56 kg/m2      Physical Exam Constitutional:      Comments: Thin, chronically ill-appearing.  Cardiovascular:     Rate and Rhythm: Normal rate and regular  rhythm.  Pulmonary:     Effort: Pulmonary effort is normal.     Breath sounds: Normal breath sounds. No wheezing, rhonchi or rales.  Abdominal:     Comments: Right upper quadrant -minimal erythema around the site of his drain.  There is drainage from the wound that has been saturating applied dressing.  Neurological:     Mental Status: He is alert.     Lab Results  Component Value Date   WBC 10.3 03/05/2022   HGB 13.2 03/05/2022   HCT 39.7 03/05/2022   PLT 235 03/05/2022   GLUCOSE 181 (H) 03/06/2022   CHOL 78 (L) 07/19/2021   TRIG 67 07/19/2021   HDL 30 (L) 07/19/2021   LDLCALC 33 07/19/2021   ALT 44 03/04/2022   AST 27 03/04/2022   NA 129 (L) 03/06/2022   K 4.6 03/06/2022   CL 104 03/06/2022   CREATININE 1.42 (H) 03/06/2022   BUN 50 (H) 03/06/2022   CO2 18 (L) 03/06/2022   TSH 9.980 (H) 10/19/2021   PSA 0.4 08/19/2014   INR 1.2 02/04/2022   HGBA1C 5.6 12/11/2021     Assessment & Plan:   Problem List Items Addressed This Visit       Digestive   Bile duct obstruction    Discussed case with Dr. Dwaine Gale (IR).  He recommended that the bag be placed again so that it can drain appropriately.   Wife called back stating that the bag that they have does not fit/work for his drain.  IR is going to reach out directly to the patient and see if this can be handled as an outpatient or if he is going to have to go to the hospital.      Follow-up:  Return if symptoms worsen or fail to improve.  ~20 mins spent on this encounter with majority involving speaking to interventional radiology and coordinating care.  [place]

## 2022-03-08 NOTE — Progress Notes (Signed)
Interventional Radiology Brief Note:  Patient complains of leakage from his internal/external biliary drain. His drain was capped prior to discharge from the hospital 03/05/22. States the bag he was given in the event of return of symptoms is not compatible with his drain.    Patient brought to [facility] Radiology for assessment.  There is evidence of biliary leakage around the drain site.  Insertion site is intact.  No erythema or pus.  Suture remains intact, although the suture knot does retract under the entry site causing irritation and tenderness.   Drain flushed and noted to be working well.  No pain or tenderness with flushing. No leakage after connection to gravity bag. Tech observed for several minutes to ensure no additional leakage was noted. Stat lock was placed.  Discussed with Dr. Dwaine Gale, no early exchange indicated.  Patient may continue with his regularly scheduled appointment.  Continue to gravity drainage for now.     Brynda Greathouse, MS RD PA-C

## 2022-03-08 NOTE — Procedures (Addendum)
Patient came in with a complaint of fluid draining around his biliary drain site. Patient also stated that the drain was capped off and that he did not have the correct drainage bag.   There was evidence of leakage around the drain site. Tech connected the correct drainage bag to the drain. Bile flowed well. Kept patient for 15 minutes to ensure that there was no more leaking around the drain site. No leaking was evident after 15 minutes. The site was dressed with gauze and medipore tape.

## 2022-03-08 NOTE — Consult Note (Signed)
Palliative Care Consult Note                                  Date: 03/08/2022   Patient Name: Alexander Banks  DOB: 11/20/1955  MRN: [id number]  Age / Sex: 66 y.o., male  PCP: [name], MD Referring Physician: No att. providers found  Reason for Consultation: Establishing goals of care  HPI/Patient Profile: 66 y.o. male  with past medical history of DM, CKD, HTN, Pancreatic Cancer and insufficiency. Patient was sent to the ED from oncologist office with reports of low blood pressure,-53/34.  Patient reports lightheadedness, weakness, no falls. He has had poor oral intake for several days to weeks, no nausea, no vomiting, no diarrhea.  He has chronic back pain related to his pancreatic cancer that is unchanged. No fevers no chills.  No difficulty breathing no cough.  No urinary symptoms.  After ED evaluation he was admitted on 03/04/2022 with AKI, hyponatremia retention, EPI creatinine adenocarcinoma, and others.  PMT was consulted for goals of care conversations.  Past Medical History:  Diagnosis Date   Anemia, iron deficiency    Diabetes mellitus without complication ([place])    on meds   Hearing loss    Bil hearing aids   Hyperlipidemia    Hypertension    Port-A-Cath in place 12/26/2020   Prediabetes    Sleep apnea     Subjective:   This NP Walden Field reviewed medical records, received report from team, assessed the patient and then meet at the patient's bedside to discuss diagnosis, prognosis, GOC, EOL wishes disposition and options.  I met with the patient and his wife Vaughan Basta at the bedside.   Concept of Palliative Care was introduced as specialized medical care for people and their families living with serious illness.  If focuses on providing relief from the symptoms and stress of a serious illness.  The goal is to improve quality of life for both the patient and the family. Values and goals of care important to patient and  family were attempted to be elicited.  Created space and opportunity for patient  and family to explore thoughts and feelings regarding current medical situation   Natural trajectory and current clinical status were discussed. Questions and concerns addressed. Patient  encouraged to call with questions or concerns.    Patient/Family Understanding of Illness: They understand he has CBD obstruction because of enlarged lymph node due to metastatic pancreatic cancer.  Since drain placement he keeps getting dehydrated due to losses from the drain.  I spoke with Dr. Raliegh Ip and are awaiting scans and further evaluation.  No further surgical options at this point.  They feel he needs to get the bag removed in order to get stronger and to evaluate for other possible treatments.  Thus far chemotherapy has no success.  Life Review: The patient and his wife been married for 35 years but together for 40 years.  They have 2 sons age 34 and 53.  The eldest son lives in [place] and the other son is in the [employer] and is at my not, [place].  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  Things that bring him happiness include family, hunting, and his 2 [place] shepherd's.  Patient Values: Quality of life, family  Goals: To get a CBD stent  placed in order to facilitate drain removal, see Dr. Gwenlyn Saran follow-up, improve strength and weight.  We discussed his advanced directives indicating no heroic measures if a terminal condition.  However, they feel there is still options and they want to remain full scope until "nothing left to do."  Today's Discussion: In addition to discussion described above we had various discussions on multiple topics.  They do note that he has signed up for outpatient palliative care with hospice of [facility] and are pending his first visit.  It was supposed to occur recently but he was  admitted to the hospital.  Ever since drain was placed he has never regained strength.  He has a lot of constipation as well likely due to dehydration.  Noted poor appetite.  They were in [place] to have a biopsy attempted but could not get it done and since then has declined.  They feel inflammation is causing the CBD block.  I reemphasized that there is an enlarged lymph node likely due to cancer spread that is compressing the CBD.  They were at [facility] but they did not do an ERCP.  They recently spoke to the surgeon about options who stated that chemotherapy, radiation, and novel immunotherapy has been effective.  [name] is currently evaluating for new immunotherapy options that are more experimental in nature and feels that there is something in [place] that could be offered.  They are in agreement with temporarily moving to [place] if there is treatment to be had.  Overall we discussed follow-up with Dr. Raliegh Ip as an outpatient after discharge, evaluation at [facility] for possible ERCP/stent placement to facilitate drain removal.  They feel that if this could happen he could have improved appetite and gain strength in order to be a candidate for further treatment.  I provided emotional and general support through therapeutic listening, empathy, sharing of stories, and other techniques. I answered all questions and addressed all concerns to the best of my ability.  Review of Systems  Constitutional:  Positive for fatigue.  Respiratory:  Negative for cough and shortness of breath.   Cardiovascular:  Negative for chest pain.  Gastrointestinal:  Negative for abdominal pain, diarrhea, nausea and vomiting.    Objective:   Primary Diagnoses: Present on Admission:  AKI (acute kidney injury) ([place])  Pancreatic adenocarcinoma ([place])  Obstructive sleep apnea  Exocrine pancreatic insufficiency  Diabetes mellitus secondary to pancreatic insufficiency ([place])  Hypotension   Hyponatremia   Physical Exam Vitals and nursing note reviewed.  Constitutional:      General: He is not in acute distress.    Appearance: He is ill-appearing.  HENT:     Head: Normocephalic and atraumatic.  Pulmonary:     Effort: Pulmonary effort is normal. No respiratory distress.  Skin:    General: Skin is warm and dry.  Neurological:     General: No focal deficit present.     Mental Status: He is alert.     Vital Signs:  BP 106/69 (BP Location: Left Arm)   Pulse 81   Temp 98.5 F (36.9 C) (Oral)   Resp 17   Ht 6' 2.5" (1.892 m)   Wt 69.6 kg   SpO2 100%   BMI 19.44 kg/m   Palliative Assessment/Data: 40%    Advanced Care Planning:   Primary Decision Maker: PATIENT  Code Status/Advance Care Planning: Full code  A discussion was had today regarding advanced directives. Concepts specific to code status, artifical feeding and hydration,  continued IV antibiotics and rehospitalization was had.  The difference between a aggressive medical intervention path and a palliative comfort care path for this patient at this time was had.   Decisions/Changes to ACP: None today  Assessment & Plan:   Impression: 66 year old male with complicated picture of pancreatic cancer and pancreatic insufficiency.  Admitted for AKI on CKD, hyponatremia, hyperkalemia likely due to losses from the biliary drain.  Overall failure to thrive.  They feel if he can have the drain removed then he would improve to the point of being a candidate for further treatment including possible experimental immunotherapy in [place].  They are hopeful for evaluation by GI for stent placement in [facility] which would allow removal of the drain.  They would like to remain full code, full scope for now.  They are okay with eventually switching to comfort care, hospice, DNR when "there are no options left."  However, they feel that they are currently options.  Overall long-term prognosis poor  SUMMARY OF  RECOMMENDATIONS   Continue full code, full scope Work toward facilitation of discharge for outpatient follow-up Follow-up with oncology as an outpatient Discussed GI referral for ERCP/stent placement in [facility] PMT will continue to follow  Symptom Management:  Per primary team PMT is available to assist as needed  Prognosis:  Unable to determine  Discharge Planning:  To Be Determined   Discussed with: Patient, patient's wife, medical team, nursing team    Thank you for allowing [place] to participate in the care of CEDARIUS KERSH PMT will continue to support holistically.  Time Total: 90 min  Greater than 50%  of this time was spent counseling and coordinating care related to the above assessment and plan.  Signed by: Walden Field, NP Palliative Medicine Team  Team Phone # [phone] (Nights/Weekends)  03/08/2022, 2:22 PM

## 2022-03-08 NOTE — Assessment & Plan Note (Addendum)
Discussed case with Dr. Dwaine Gale (IR).  He recommended that the bag be placed again so that it can drain appropriately.   Wife called back stating that the bag that they have does not fit/work for his drain.  IR is going to reach out directly to the patient and see if this can be handled as an outpatient or if he is going to have to go to the hospital.

## 2022-03-08 NOTE — Telephone Encounter (Signed)
Pt has appt scheduled at 11:40 today with Dr.Cook

## 2022-03-08 NOTE — Telephone Encounter (Signed)
[place] took message on patient-Wife Alexander Banks told her patient has tenderness around biliary drain and drainage is soaked through the dressing. She didn't know where to direct  this issues to. Please advise

## 2022-03-08 NOTE — Telephone Encounter (Signed)
Pt has follow up scheduled for next Friday and is needing to know if they need blood work. Please advise. Thank you.

## 2022-03-08 NOTE — Telephone Encounter (Signed)
Metabolic 7, liver, CBC Elevated liver enzymes, elevated creatinine, pancreatic cancer

## 2022-03-08 NOTE — Chronic Care Management (AMB) (Signed)
  Care Coordination  Note  03/08/2022 Name: MEREDITH MELLS MRN: [id number] DOB: 1956/05/14  [name] is a 66 y.o. year old primary care patient of Luking, Elayne Snare, MD. I reached out to [name] by phone today to assist with scheduling a follow up appointment. [name] verbally consented to my assistance.       Follow up plan: Hospital Follow Up appointment scheduled with [name], MD on 03/14/22 at 11:40 AM with check in time at 11:30 AM.  [place]: [phone]

## 2022-03-08 NOTE — Chronic Care Management (AMB) (Signed)
  Care Coordination   Note   03/08/2022 Name: Alexander Banks MRN: [id number] DOB: [birth year]  [name] is a 66 y.o. year old male who sees Luking, Elayne Snare, MD for primary care. I reached out to [name] by phone today to offer care coordination services.  [name] was given information about Care Coordination services today including:   The Care Coordination services include support from the care team which includes your Nurse Coordinator, Clinical Social Worker, or Pharmacist.  The Care Coordination team is here to help remove barriers to the health concerns and goals most important to you. Care Coordination services are voluntary, and the patient may decline or stop services at any time by request to their care team member.   Care Coordination Consent Status: Patient agreed to services and verbal consent obtained.  Patient would like for RNCM to call spouse [name].   Follow up plan:  Telephone appointment with care coordination team member scheduled for:  03/12/22  Encounter Outcome:  Pt. Scheduled

## 2022-03-09 LAB — CULTURE, BLOOD (SINGLE): Culture: NO GROWTH

## 2022-03-11 NOTE — Telephone Encounter (Signed)
Blood work ordered in Epic. Left message to return call 

## 2022-03-11 NOTE — Addendum Note (Signed)
Addended by: Dairl Ponder on: 03/11/2022 09:49 AM   Modules accepted: Orders

## 2022-03-11 NOTE — Telephone Encounter (Signed)
Patient notified

## 2022-03-12 ENCOUNTER — Ambulatory Visit: Payer: Self-pay | Admitting: *Deleted

## 2022-03-12 NOTE — Patient Outreach (Signed)
  Care Coordination   Initial Visit Note   04/04/2022 Name: KAZI REPPOND MRN: [id number] DOB: 1956/01/10  [name] is a 66 y.o. year old male who sees Luking, Elayne Snare, MD for primary care. I spoke with  [name] by phone today.  What matters to the patients health and wellness today?  Goal to get strength up to endure cancer treatment    BP fine  poor appetite  Need earlier IR appointment  want to replace bad and put in an internal stent  Pt not taking in enough fluids to match the output has biliary JP drain  They spoke with morgan on 03/11/22  Weight loss was 220=240 now 150  Take supplements in small amounts need more calorie than protein CBG manageable 100's Suggest no milk supplement   Goals Addressed               This Visit's Progress     Patient Stated     obtain  drain exchange/evaluation [facility]) (pt-stated)   Not on track     Care Coordination Interventions: Reviewed upcoming provider appointments and treatment appointments Nutrition assessment performed Secure chat to oncologist about  drain exchange/evaluation status, received a return message that he is agreeable to the plan.         SDOH assessments and interventions completed:  Yes     Care Coordination Interventions Activated:  Yes  Care Coordination Interventions:  Yes, provided   Follow up plan: Follow up call scheduled for 04/17/22    Encounter Outcome:  Pt. Visit Completed    [place] L. Lavina Hamman, RN, BSN, Cataract Coordinator Office number [phone]

## 2022-03-13 ENCOUNTER — Encounter: Payer: Self-pay | Admitting: Family Medicine

## 2022-03-13 ENCOUNTER — Ambulatory Visit (INDEPENDENT_AMBULATORY_CARE_PROVIDER_SITE_OTHER): Payer: BC Managed Care – PPO | Admitting: Family Medicine

## 2022-03-13 VITALS — BP 128/80 | Wt 149.8 lb

## 2022-03-13 DIAGNOSIS — C259 Malignant neoplasm of pancreas, unspecified: Secondary | ICD-10-CM

## 2022-03-13 DIAGNOSIS — R748 Abnormal levels of other serum enzymes: Secondary | ICD-10-CM | POA: Diagnosis not present

## 2022-03-13 DIAGNOSIS — H6120 Impacted cerumen, unspecified ear: Secondary | ICD-10-CM

## 2022-03-13 MED ORDER — OXYCODONE HCL 5 MG PO TABS: ORAL_TABLET | ORAL | 0 refills | Status: DC

## 2022-03-13 NOTE — Progress Notes (Signed)
   Subjective:    Patient ID: Alexander Banks, male    DOB: [birth year], 66 y.o.   MRN: [id number]  HPI Very nice patient He is doing the best he can to fight against pancreatic cancer Still has difficult time eating with nausea Recommended Zofran to help with nausea  Pt arrives for hospital follow up. Pt released from hospital on 03/07/22. Pt having chronic back pain. Having IR procedure to possibly replace drain or put stint on on 03/15/22.  Patient currently undergoing treatments unfortunately chemotherapy has not worked for them there are no experimental treatments locally Possibly will have further surgery in [place] down the road hard to know Review of Systems     Objective:   Physical Exam Lungs clear heart regular pulse normal       Assessment & Plan:  Pancreatic cancer Possible tumor related pain It is also possible external stent could be causing some issues Recommend oxycodone for severe pain Zofran may have at home use for nausea Await lab work

## 2022-03-14 ENCOUNTER — Encounter: Payer: Self-pay | Admitting: Hematology

## 2022-03-14 ENCOUNTER — Inpatient Hospital Stay: Payer: BC Managed Care – PPO | Admitting: Family Medicine

## 2022-03-14 ENCOUNTER — Other Ambulatory Visit: Payer: Self-pay | Admitting: Student

## 2022-03-14 ENCOUNTER — Other Ambulatory Visit: Payer: Self-pay

## 2022-03-14 DIAGNOSIS — C259 Malignant neoplasm of pancreas, unspecified: Secondary | ICD-10-CM

## 2022-03-14 LAB — BASIC METABOLIC PANEL
BUN/Creatinine Ratio: 27 — ABNORMAL HIGH (ref 10–24)
BUN: 34 mg/dL — ABNORMAL HIGH (ref 8–27)
CO2: 23 mmol/L (ref 20–29)
Calcium: 9 mg/dL (ref 8.6–10.2)
Chloride: 97 mmol/L (ref 96–106)
Creatinine, Ser: 1.24 mg/dL (ref 0.76–1.27)
Glucose: 143 mg/dL — ABNORMAL HIGH (ref 70–99)
Potassium: 5.4 mmol/L — ABNORMAL HIGH (ref 3.5–5.2)
Sodium: 133 mmol/L — ABNORMAL LOW (ref 134–144)
eGFR: 65 mL/min/{1.73_m2} (ref >59)

## 2022-03-14 LAB — CBC WITH DIFFERENTIAL/PLATELET
Basophils Absolute: 0.1 10*3/uL (ref 0.0–0.2)
Basos: 1 %
EOS (ABSOLUTE): 0.2 10*3/uL (ref 0.0–0.4)
Eos: 3 %
Hematocrit: 36.8 % — ABNORMAL LOW (ref 37.5–51.0)
Hemoglobin: 12.2 g/dL — ABNORMAL LOW (ref 13.0–17.7)
Immature Grans (Abs): 0 10*3/uL (ref 0.0–0.1)
Immature Granulocytes: 0 %
Lymphocytes Absolute: 1.9 10*3/uL (ref 0.7–3.1)
Lymphs: 26 %
MCH: 31.4 pg (ref 26.6–33.0)
MCHC: 33.2 g/dL (ref 31.5–35.7)
MCV: 95 fL (ref 79–97)
Monocytes Absolute: 0.7 10*3/uL (ref 0.1–0.9)
Monocytes: 10 %
Neutrophils Absolute: 4.5 10*3/uL (ref 1.4–7.0)
Neutrophils: 60 %
Platelets: 423 10*3/uL (ref 150–450)
RBC: 3.89 x10E6/uL — ABNORMAL LOW (ref 4.14–5.80)
RDW: 14.8 % (ref 11.6–15.4)
WBC: 7.3 10*3/uL (ref 3.4–10.8)

## 2022-03-14 LAB — HEPATIC FUNCTION PANEL
ALT: 11 IU/L (ref 0–44)
AST: 13 IU/L (ref 0–40)
Albumin: 3.7 g/dL — ABNORMAL LOW (ref 3.9–4.9)
Alkaline Phosphatase: 138 IU/L — ABNORMAL HIGH (ref 44–121)
Bilirubin Total: 1.6 mg/dL — ABNORMAL HIGH (ref 0.0–1.2)
Bilirubin, Direct: 1.17 mg/dL — ABNORMAL HIGH (ref 0.00–0.40)
Total Protein: 6.9 g/dL (ref 6.0–8.5)

## 2022-03-14 NOTE — Progress Notes (Signed)
Lab orders placed for the week of November 6 through March 29, 2022 per [name].

## 2022-03-15 ENCOUNTER — Other Ambulatory Visit (HOSPITAL_COMMUNITY): Payer: Self-pay | Admitting: Radiology

## 2022-03-15 ENCOUNTER — Ambulatory Visit (HOSPITAL_COMMUNITY)
Admission: RE | Admit: 2022-03-15 | Discharge: 2022-03-15 | Disposition: A | Discharge status: Home / Self Care | Payer: BC Managed Care – PPO | Source: Ambulatory Visit | Attending: Radiology | Admitting: Radiology

## 2022-03-15 ENCOUNTER — Inpatient Hospital Stay: Payer: BC Managed Care – PPO | Admitting: Family Medicine

## 2022-03-15 ENCOUNTER — Encounter (HOSPITAL_COMMUNITY): Payer: Self-pay

## 2022-03-15 DIAGNOSIS — K831 Obstruction of bile duct: Secondary | ICD-10-CM | POA: Insufficient documentation

## 2022-03-15 DIAGNOSIS — Z8507 Personal history of malignant neoplasm of pancreas: Secondary | ICD-10-CM | POA: Diagnosis not present

## 2022-03-15 DIAGNOSIS — F17290 Nicotine dependence, other tobacco product, uncomplicated: Secondary | ICD-10-CM | POA: Insufficient documentation

## 2022-03-15 DIAGNOSIS — C259 Malignant neoplasm of pancreas, unspecified: Secondary | ICD-10-CM

## 2022-03-15 HISTORY — PX: IR BILIARY STENT(S) EXISTING ACCESS INC DILATION CATH EXCHANGE: IMG6048

## 2022-03-15 LAB — GLUCOSE, CAPILLARY: Glucose-Capillary: 159 mg/dL — ABNORMAL HIGH (ref 70–99)

## 2022-03-15 MED ORDER — MIDAZOLAM HCL 2 MG/2ML IJ SOLN
INTRAMUSCULAR | Status: AC | PRN
  Administered 2022-03-15: .5 mg via INTRAVENOUS
  Administered 2022-03-15: 1 mg via INTRAVENOUS
  Administered 2022-03-15: .5 mg via INTRAVENOUS

## 2022-03-15 MED ORDER — SODIUM CHLORIDE 0.9 % IV SOLN: Freq: Once | INTRAVENOUS | Status: AC

## 2022-03-15 MED ORDER — SODIUM CHLORIDE 0.9 % IV SOLN
INTRAVENOUS | Status: AC
  Filled 2022-03-15: qty 20

## 2022-03-15 MED ORDER — SODIUM CHLORIDE 0.9 % IV SOLN: INTRAVENOUS | Status: DC

## 2022-03-15 MED ORDER — FENTANYL CITRATE (PF) 100 MCG/2ML IJ SOLN
INTRAMUSCULAR | Status: AC | PRN
  Administered 2022-03-15 (×2): 25 ug via INTRAVENOUS
  Administered 2022-03-15: 50 ug via INTRAVENOUS

## 2022-03-15 MED ORDER — LIDOCAINE HCL 1 % IJ SOLN
INTRAMUSCULAR | Status: AC
  Administered 2022-03-15: 10 mL
  Filled 2022-03-15: qty 20

## 2022-03-15 MED ORDER — SODIUM CHLORIDE 0.9 % IV SOLN: 2.0000 g | INTRAVENOUS | Status: DC

## 2022-03-15 MED ORDER — HEPARIN SOD (PORK) LOCK FLUSH 100 UNIT/ML IV SOLN
500.0000 [IU] | INTRAVENOUS | Status: AC | PRN
  Administered 2022-03-15: 500 [IU]

## 2022-03-15 MED ORDER — IOHEXOL 300 MG/ML  SOLN
50.0000 mL | Freq: Once | INTRAMUSCULAR | Status: AC | PRN
  Administered 2022-03-15: 25 mL

## 2022-03-15 MED ORDER — MIDAZOLAM HCL 2 MG/2ML IJ SOLN
INTRAMUSCULAR | Status: AC
  Filled 2022-03-15: qty 2

## 2022-03-15 MED ORDER — SODIUM CHLORIDE 0.9 % IV SOLN
INTRAVENOUS | Status: AC | PRN
  Administered 2022-03-15: 2 g via INTRAVENOUS

## 2022-03-15 MED ORDER — FENTANYL CITRATE (PF) 100 MCG/2ML IJ SOLN
INTRAMUSCULAR | Status: AC
  Filled 2022-03-15: qty 2

## 2022-03-15 NOTE — H&P (Signed)
Chief Complaint: Patient was seen in consultation today for at the request of [name]  Referring Physician(s): [name]  Supervising Physician: Corrie Mckusick  Patient Status: [facility] - Out-pt  History of Present Illness: Alexander Banks is a 66 y.o. male with PMH significant for  pancreatic cancer s/p Whipple surgery with malignant biliary obstruction of the CBD. Pt is well known to IR service having percutaneous transhepatic cholangiogram with placement of internal/external biliary drainage catheter 02/04/22.  Pt presents today for biliary drain injection, possible exchange, possible angioplasty/stent placement with moderate sedation.   Pt denies chills, fevers, CP or SOB.  He endorses abdominal pain, back pain, loss of appetite and weight loss.   Past Medical History:  Diagnosis Date   Anemia, iron deficiency    Diabetes mellitus without complication ([place])    on meds   Hearing loss    Bil hearing aids   Hyperlipidemia    Hypertension    Port-A-Cath in place 12/26/2020   Prediabetes    Sleep apnea     Past Surgical History:  Procedure Laterality Date   BILIARY STENT PLACEMENT N/A 11/09/2020   Procedure: BILIARY STENT PLACEMENT - 9 F X 5 CM STENT;  Surgeon: Rogene Houston, MD;  Location: [facility] ORS;  Service: Endoscopy;  Laterality: N/A;   BILIARY STENT PLACEMENT N/A 12/27/2020   Procedure: BILIARY STENT PLACEMENT;  Surgeon: Rogene Houston, MD;  Location: [facility];  Service: Endoscopy;  Laterality: N/A;   COLONOSCOPY     ENTEROSCOPY N/A 12/27/2021   Procedure: ENTEROSCOPY;  Surgeon: Mansouraty, Telford Nab., MD;  Location: [facility] ENDOSCOPY;  Service: Gastroenterology;  Laterality: N/A;   ERCP N/A 11/09/2020   Procedure: ENDOSCOPIC RETROGRADE CHOLANGIOPANCREATOGRAPHY (ERCP);  Surgeon: Rogene Houston, MD;  Location: [facility];  Service: Endoscopy;  Laterality: N/A;   ERCP N/A 12/27/2020   Procedure: ENDOSCOPIC RETROGRADE CHOLANGIOPANCREATOGRAPHY (ERCP);  Surgeon: Rogene Houston, MD;  Location: [facility];  Service: Endoscopy;  Laterality: N/A;   ESOPHAGOGASTRODUODENOSCOPY (EGD) WITH PROPOFOL N/A 11/30/2020   Procedure: ESOPHAGOGASTRODUODENOSCOPY (EGD) WITH PROPOFOL;  Surgeon: Milus Banister, MD;  Location: [facility] ENDOSCOPY;  Service: Endoscopy;  Laterality: N/A;   EUS N/A 11/30/2020   Procedure: UPPER ENDOSCOPIC ULTRASOUND (EUS) RADIAL;  Surgeon: Milus Banister, MD;  Location: [facility] ENDOSCOPY;  Service: Endoscopy;  Laterality: N/A;   FINE NEEDLE ASPIRATION N/A 11/30/2020   Procedure: FINE NEEDLE ASPIRATION (FNA) LINEAR;  Surgeon: Milus Banister, MD;  Location: [facility] ENDOSCOPY;  Service: Endoscopy;  Laterality: N/A;   GIVENS CAPSULE STUDY     IR BILIARY DRAIN PLACEMENT WITH CHOLANGIOGRAM  02/04/2022   IR IMAGING GUIDED PORT INSERTION  12/18/2020   IR PATIENT EVAL TECH 0-60 MINS  03/08/2022   KNEE SURGERY     4 surgeries on left, 2 on right knee   SHOULDER SURGERY     right shoulder   SPHINCTEROTOMY N/A 11/09/2020   Procedure: SHORT BILIARY SPHINCTEROTOMY;  Surgeon: Rogene Houston, MD;  Location: [facility];  Service: Endoscopy;  Laterality: N/A;   WHIPPLE PROCEDURE      Allergies: Lisinopril  Medications: Prior to Admission medications   Medication Sig Start Date End Date Taking? Authorizing Provider  apixaban (ELIQUIS) 5 MG TABS tablet Take 1 tablet (5 mg total) by mouth 2 (two) times daily. 12/19/21  Yes Luking, Elayne Snare, MD  azelastine (ASTELIN) 0.1 % nasal spray Place 2 sprays into both nostrils 2 (two) times daily. Patient taking differently: Place 2 sprays into both nostrils  2 (two) times daily as needed for allergies. 11/23/21  Yes Luking, Elayne Snare, MD  Continuous Blood Gluc Sensor (FREESTYLE LIBRE 3 SENSOR) MISC 1 Piece by Does not apply route every 14 (fourteen) days. Place 1 sensor on the skin every 14 days. Use to check glucose continuously Dx E11.65 10/22/21  Yes Nida, Marella Chimes, MD  feeding supplement (ENSURE ENLIVE / ENSURE PLUS) LIQD Take 237 mLs by  mouth 2 (two) times daily between meals. 03/08/22  Yes Emokpae, Courage, MD  insulin detemir (LEVEMIR) 100 UNIT/ML FlexPen Inject 6 Units into the skin at bedtime. 02/13/22  Yes Nida, Marella Chimes, MD  insulin lispro (HUMALOG KWIKPEN) 100 UNIT/ML KwikPen Inject 4-7 Units into the skin 3 (three) times daily before meals. 02/13/22  Yes Nida, Marella Chimes, MD  lactulose (CHRONULAC) 10 GM/15ML solution Take 30 mLs (20 g total) by mouth daily. 03/07/22  Yes Roxan Hockey, MD  levothyroxine (SYNTHROID) 175 MCG tablet TAKE 1 TABLET DAILY Patient taking differently: Take 175 mcg by mouth daily before breakfast. 10/17/21  Yes Luking, Elayne Snare, MD  lipase/protease/amylase (CREON) 36000 UNITS CPEP capsule Take 2 capsules (72,000 Units total) by mouth 3 (three) times daily with meals. May also take 1 capsule (36,000 Units total) as needed (with snacks). 03/07/22  Yes Emokpae, Courage, MD  oxyCODONE (OXY IR/ROXICODONE) 5 MG immediate release tablet 1 to 1 and 1/2 q4 prn severe pain 03/13/22  Yes Luking, Scott A, MD  sennosides-docusate sodium (SENOKOT-S) 8.6-50 MG tablet Take 1 tablet by mouth daily.   Yes [provider]  sildenafil (VIAGRA) 100 MG tablet TAKE 1/2 TO 1 TABLET BY MOUTH AS NEEDED FOR ERECTILE DYSFUNCTION Patient taking differently: Take 50-100 mg by mouth as needed for erectile dysfunction. 12/03/21  Yes [name], MD  sodium bicarbonate 650 MG tablet Take 1 tablet (650 mg total) by mouth 2 (two) times daily. 03/07/22  Yes Emokpae, Courage, MD  tamsulosin (FLOMAX) 0.4 MG CAPS capsule Take 1 capsule (0.4 mg total) by mouth daily after supper. 03/07/22  Yes Roxan Hockey, MD     Family History  Problem Relation Age of Onset   Heart attack Father    Heart attack Paternal Grandfather    Leukemia Sister    Heart attack Brother     Social History   Socioeconomic History   Marital status: Married    Spouse name: Not on file   Number of children: Not on file   Years of  education: Not on file   Highest education level: Not on file  Occupational History   Not on file  Tobacco Use   Smoking status: Every Day    Types: Cigars   Smokeless tobacco: Never   Tobacco comments:    Smoke 3-4 cigars a day  Vaping Use   Vaping Use: Never used  Substance and Sexual Activity   Alcohol use: Yes    Comment: occasional   Drug use: Never   Sexual activity: Not on file  Other Topics Concern   Not on file  Social History Narrative   Not on file   Social Determinants of Health   Financial Resource Strain: Low Risk  (12/20/2020)   Overall Financial Resource Strain (CARDIA)    Difficulty of Paying Living Expenses: Not hard at all  Food Insecurity: No Food Insecurity (02/02/2022)   Hunger Vital Sign    Worried About Running Out of Food in the Last Year: Never true    [place] in the Last  Year: Never true  Transportation Needs: No Transportation Needs (02/02/2022)   PRAPARE - Hydrologist (Medical): No    Lack of Transportation (Non-Medical): No  Physical Activity: Sufficiently Active (12/20/2020)   Exercise Vital Sign    Days of Exercise per Week: 5 days    Minutes of Exercise per Session: 30 min  Recent Concern: Physical Activity - Inactive (11/17/2020)   Exercise Vital Sign    Days of Exercise per Week: 0 days    Minutes of Exercise per Session: 0 min  Stress: Not on file  Social Connections: Not on file    Review of Systems: A 12 point ROS discussed and pertinent positives are indicated in the HPI above.  All other systems are negative.  Review of Systems  Constitutional:  Positive for appetite change, fatigue and unexpected weight change. Negative for chills and fever.  Respiratory:  Negative for shortness of breath.   Cardiovascular:  Negative for chest pain.  Gastrointestinal:  Positive for abdominal pain. Negative for nausea and vomiting.  Musculoskeletal:  Positive for back pain and joint swelling.  Neurological:   Positive for weakness and headaches. Negative for dizziness.    Vital Signs: BP 102/71   Pulse (!) 102   Temp 97.7 F (36.5 C) (Oral)   Resp 16   Ht '6\' 2"'$  (1.88 m)   Wt 147 lb (66.7 kg)   SpO2 98%   BMI 18.87 kg/m     Physical Exam Vitals reviewed.  Constitutional:      General: He is not in acute distress.    Appearance: Normal appearance. He is ill-appearing.  HENT:     Head: Normocephalic and atraumatic.     Mouth/Throat:     Mouth: Mucous membranes are dry.     Pharynx: Oropharynx is clear.  Eyes:     Extraocular Movements: Extraocular movements intact.     Pupils: Pupils are equal, round, and reactive to light.  Cardiovascular:     Rate and Rhythm: Regular rhythm. Tachycardia present.     Pulses: Normal pulses.     Heart sounds: Normal heart sounds. No murmur heard. Pulmonary:     Effort: Pulmonary effort is normal. No respiratory distress.     Breath sounds: Normal breath sounds.  Abdominal:     Comments: Biliary drain in place with yellow OP to gravity bag. Dressing C/D/I  Musculoskeletal:     Right lower leg: No edema.     Left lower leg: No edema.  Skin:    General: Skin is warm and dry.  Neurological:     Mental Status: He is alert and oriented to person, place, and time.  Psychiatric:        Mood and Affect: Mood normal.        Behavior: Behavior normal.        Thought Content: Thought content normal.        Judgment: Judgment normal.     Imaging: IR PATIENT EVAL TECH 0-60 MINS  Result Date: 03/12/2022 INDICATION: 66 year old gentleman with history of biliary obstruction due to pancreatic malignancy was treated with percutaneous biliary drain placement on 02/04/2022. He returns with leakage around the catheter entry site. EXAM: Evaluation of internal-external biliary drain to physical examination. MEDICATIONS: None ANESTHESIA/SEDATION: None COMPLICATIONS: None immediate. PROCEDURE: Patient was evaluated at bedside. There was significant leakage  around the catheter entry site. The drain did not appear significantly retracted. The drain was attached to bag and allowed to evacuate the  bile for several minutes. The patient was then observed for 15 additional minutes and no leakage was seen around the catheter entry site. IMPRESSION: 66 year old gentleman with history of pancreatic malignancy resulting in biliary obstruction, treated by percutaneous internal-external biliary drain placement returned to IR with increased leakage. Once the drain was reattached to bag, the leaking stopped. PLAN: Patient scheduled for routine exchange on 04/01/2022. Electronically Signed   By: Miachel Roux M.D.   On: 03/12/2022 09:46   DG CHEST PORT 1 VIEW  Result Date: 03/04/2022 CLINICAL DATA:  Hypotension EXAM: PORTABLE CHEST 1 VIEW COMPARISON:  Radiographs 12/11/2021 FINDINGS: No focal consolidation, pleural effusion, or pneumothorax. Normal cardiomediastinal silhouette. No acute osseous abnormality. Remote left rib fracture. Accessed right chest wall Port-A-Cath with tip in the mid SVC. IMPRESSION: No active disease. Electronically Signed   By: Placido Sou M.D.   On: 03/04/2022 19:30   NM PET Image Restag (PS) Skull Base To Thigh  Result Date: 02/24/2022 CLINICAL DATA:  Subsequent treatment strategy for pancreatic cancer. EXAM: NUCLEAR MEDICINE PET SKULL BASE TO THIGH TECHNIQUE: 8.49 mCi F-18 FDG was injected intravenously. Full-ring PET imaging was performed from the skull base to thigh after the radiotracer. CT data was obtained and used for attenuation correction and anatomic localization. Fasting blood glucose: 187 mg/dl COMPARISON:  Multiple prior CT scans. The most recent is 01/29/2022 FINDINGS: Mediastinal blood pool activity: SUV max 2.44 Liver activity: SUV max NA NECK: No hypermetabolic lymph nodes in the neck. Incidental CT findings: Mild uniform hypermetabolism in the thyroid gland, likely reflecting thyroiditis. No thyroid nodules. CHEST: No  hypermetabolic mediastinal or hilar nodes. No suspicious pulmonary nodules on the CT scan. Incidental CT findings: Stable emphysematous changes and three-vessel coronary artery calcifications. ABDOMEN/PELVIS: Since the prior CT scan the patient has had a external biliary drainage catheter placed. No persistent intrahepatic biliary dilatation. Small amount of pneumobilia, not unexpected. The catheter is in good position without complicating features. Focal area hypermetabolism is noted in the peripheral aspect of segment 5 of the liver difficult to identify definite hepatic lesion on the CT scan but this is certainly worrisome for a new hepatic metastatic focus. SUV max is 7.53. As demonstrated on the prior CT scan there are peripancreatic nodes demonstrating hypermetabolism. Periportal node has an SUV max of 5.17. The node near the superior mesenteric artery has an SUV max of 5.04. No retroperitoneal adenopathy. The adrenal glands are unremarkable. Incidental CT findings: Stable aortic and iliac artery calcifications. SKELETON: No findings for osseous metastatic disease. Incidental CT findings: None. IMPRESSION: 1. Hypermetabolic focus in segment 5 of the liver is worrisome for a metastatic lesion. This is difficult to see on the CT scan. 2. Decompression of the biliary system within externalized drainage catheter. No complicating features. 3. Hypermetabolic upper abdominal lymph nodes as above. 4. No findings for metastatic disease involving the chest or bony structures. 5. Stable age advanced vascular disease. Electronically Signed   By: Marijo Sanes M.D.   On: 02/24/2022 10:15   US Abdomen Complete  Result Date: 02/14/2022 CLINICAL DATA:  Pancreatic cancer.  Abdominal pain. EXAM: ABDOMEN ULTRASOUND COMPLETE COMPARISON:  CT scan 01/29/2022 FINDINGS: Gallbladder: Surgically absent. Common bile duct: Diameter: Biliary drainage catheter in place. Liver: Diffuse increased echogenicity. Mild intrahepatic biliary  dilatation. No focal hepatic lesions. Portal vein is patent on color Doppler imaging with normal direction of blood flow towards the liver. IVC: Normal caliber. Pancreas: Not visualized. Spleen: Normal size.  No focal lesions. Right Kidney: Length: 11.0  cm. Normal renal cortical thickness and echogenicity. No hydronephrosis. Small midpole renal cyst. Left Kidney: Length: 11.4 cm. Normal renal cortical thickness and echogenicity without focal lesions or hydronephrosis. Abdominal aorta: Normal caliber Other findings: None. IMPRESSION: 1. Status post cholecystectomy with biliary drainage catheter in place. 2. Diffuse increased echogenicity of the liver most likely due to steatosis. 3. Mild intrahepatic biliary dilatation. 4. Poor visualization of the pancreas. 5. Normal spleen and both kidneys. Electronically Signed   By: Marijo Sanes M.D.   On: 02/14/2022 10:02    Labs:  CBC: Recent Labs    02/22/22 0857 03/04/22 1401 03/05/22 0429 03/13/22 1541  WBC 9.1 11.9* 10.3 7.3  HGB 14.3 13.0 13.2 12.2*  HCT 43.3 37.7* 39.7 36.8*  PLT 384 323 235 423    COAGS: Recent Labs    12/11/21 1005 02/02/22 2202 02/04/22 0327  INR 1.1  --  1.2  APTT 33 31  --     BMP: Recent Labs    03/04/22 1401 03/04/22 1918 03/05/22 0429 03/05/22 2057 03/06/22 0625 03/13/22 1541  NA 120*  --  127* 129* 129* 133*  K 6.8*   < > 5.9* 5.2* 4.6 5.4*  CL 87*  --  99 102 104 97  CO2 21*  --  16* 20* 18* 23  GLUCOSE 115*  --  146* 132* 181* 143*  BUN 78*  --  67* 58* 50* 34*  CALCIUM 9.4  --  8.8* 8.6* 8.3* 9.0  CREATININE 4.50*  --  2.92* 1.86* 1.42* 1.24  GFRNONAA 14*  --  23* 40* 55*  --    < > = values in this interval not displayed.    LIVER FUNCTION TESTS: Recent Labs    02/22/22 0857 02/25/22 0805 03/04/22 1401 03/13/22 1541  BILITOT 4.5* 3.7* 3.1* 1.6*  AST 36 36 27 13  ALT 39 40 44 11  ALKPHOS 209* 164* 122 138*  PROT 8.3* 7.9 8.1 6.9  ALBUMIN 3.2* 3.2* 3.4* 3.7*    TUMOR MARKERS: No  results for input(s): "AFPTM", "CEA", "CA199", "CHROMGRNA" in the last 8760 hours.  Assessment and Plan:  66 yo male with hx of pancreatic cancer s/p whipple presents to IR for biliary drain injection with possible exchange and possible stent placement.   Pt resting on stretcher with wife at bedside.  He is A&O and calm.  He is in no distress.  Pt is NPO per order.  Dr. Earleen Newport at bedside discussing procedure.   Risks and benefits of biliary drain injection, possible exchange, possible angioplasty/stent with moderate sedation discussed with the patient including, but not limited to bleeding, infection which may lead to sepsis or even death and damage to adjacent structures.  This interventional procedure involves the use of X-rays and because of the nature of the planned procedure, it is possible that we will have prolonged use of X-ray fluoroscopy.  Potential radiation risks to you include (but are not limited to) the following: - A slightly elevated risk for cancer  several years later in life. This risk is typically less than 0.5% percent. This risk is low in comparison to the normal incidence of human cancer, which is 33% for women and 50% for men according to the [employer]. - Radiation induced injury can include skin redness, resembling a rash, tissue breakdown / ulcers and hair loss (which can be temporary or permanent).   The likelihood of either of these occurring depends on the difficulty of the procedure and whether  you are sensitive to radiation due to previous procedures, disease, or genetic conditions.   IF your procedure requires a prolonged use of radiation, you will be notified and given written instructions for further action.  It is your responsibility to monitor the irradiated area for the 2 weeks following the procedure and to notify your physician if you are concerned that you have suffered a radiation induced injury.    All of the patient's questions were  answered, patient is agreeable to proceed.  Consent signed and in chart.   Thank you for this interesting consult.  I greatly enjoyed meeting Alexander Banks and look forward to participating in their care.  A copy of this report was sent to the requesting provider on this date.  Electronically Signed: Tyson Alias, NP 03/15/2022, 1:20 PM   I spent a total of 20 minutes in face to face in clinical consultation, greater than 50% of which was counseling/coordinating care for biliary drain.

## 2022-03-15 NOTE — Progress Notes (Signed)
200 cc of fluid emptied from drain.

## 2022-03-15 NOTE — Procedures (Signed)
Interventional Radiology Procedure Note  Procedure:   Image guided stenting of anastamotic stricture at choledochojejunostomy.   17m x 420m  Image guided exchange of externalized drain (above the stent), for capping trial.  .  Complications: None  Recommendations:  - 1 hour DC home - drain is capped.  Patient to DC with drain bag, in case of new fever, rigors, chills.  - Pt to return in 1-2 weeks with new CMP for possible drain removal. - Do not submerge - Routine wound care   Signed,  JaDulcy FannyWaEarleen NewportDO

## 2022-03-15 NOTE — Progress Notes (Signed)
Per Manuela Schwartz, NP: port can be accessed for procedure.

## 2022-03-16 DIAGNOSIS — M659 Synovitis and tenosynovitis, unspecified: Secondary | ICD-10-CM | POA: Diagnosis not present

## 2022-03-26 ENCOUNTER — Emergency Department (HOSPITAL_COMMUNITY): Payer: BC Managed Care – PPO

## 2022-03-26 ENCOUNTER — Other Ambulatory Visit: Payer: Self-pay

## 2022-03-26 ENCOUNTER — Inpatient Hospital Stay (HOSPITAL_COMMUNITY): Payer: BC Managed Care – PPO

## 2022-03-26 ENCOUNTER — Ambulatory Visit (INDEPENDENT_AMBULATORY_CARE_PROVIDER_SITE_OTHER): Payer: BC Managed Care – PPO | Admitting: Family Medicine

## 2022-03-26 ENCOUNTER — Other Ambulatory Visit (HOSPITAL_COMMUNITY): Payer: BC Managed Care – PPO

## 2022-03-26 ENCOUNTER — Encounter: Payer: Self-pay | Admitting: Family Medicine

## 2022-03-26 ENCOUNTER — Inpatient Hospital Stay (HOSPITAL_COMMUNITY)
Admission: EM | Admit: 2022-03-26 | Discharge: 2022-03-30 | DRG: 380 | Disposition: A | Discharge status: Home / Self Care | Payer: BC Managed Care – PPO | Attending: Family Medicine | Admitting: Family Medicine

## 2022-03-26 ENCOUNTER — Encounter (HOSPITAL_COMMUNITY): Payer: Self-pay

## 2022-03-26 VITALS — BP 82/58 | Wt 151.2 lb

## 2022-03-26 DIAGNOSIS — Z4682 Encounter for fitting and adjustment of non-vascular catheter: Secondary | ICD-10-CM | POA: Diagnosis not present

## 2022-03-26 DIAGNOSIS — Z90411 Acquired partial absence of pancreas: Secondary | ICD-10-CM

## 2022-03-26 DIAGNOSIS — R109 Unspecified abdominal pain: Secondary | ICD-10-CM | POA: Diagnosis not present

## 2022-03-26 DIAGNOSIS — C259 Malignant neoplasm of pancreas, unspecified: Secondary | ICD-10-CM | POA: Diagnosis not present

## 2022-03-26 DIAGNOSIS — E860 Dehydration: Secondary | ICD-10-CM | POA: Diagnosis present

## 2022-03-26 DIAGNOSIS — F17200 Nicotine dependence, unspecified, uncomplicated: Secondary | ICD-10-CM | POA: Diagnosis present

## 2022-03-26 DIAGNOSIS — Z28310 Unvaccinated for covid-19: Secondary | ICD-10-CM

## 2022-03-26 DIAGNOSIS — Z794 Long term (current) use of insulin: Secondary | ICD-10-CM

## 2022-03-26 DIAGNOSIS — Z681 Body mass index (BMI) 19 or less, adult: Secondary | ICD-10-CM | POA: Diagnosis not present

## 2022-03-26 DIAGNOSIS — Z8249 Family history of ischemic heart disease and other diseases of the circulatory system: Secondary | ICD-10-CM | POA: Diagnosis not present

## 2022-03-26 DIAGNOSIS — I10 Essential (primary) hypertension: Secondary | ICD-10-CM | POA: Diagnosis not present

## 2022-03-26 DIAGNOSIS — E43 Unspecified severe protein-calorie malnutrition: Secondary | ICD-10-CM | POA: Diagnosis not present

## 2022-03-26 DIAGNOSIS — K8681 Exocrine pancreatic insufficiency: Secondary | ICD-10-CM | POA: Diagnosis present

## 2022-03-26 DIAGNOSIS — R1110 Vomiting, unspecified: Secondary | ICD-10-CM | POA: Diagnosis not present

## 2022-03-26 DIAGNOSIS — Z79899 Other long term (current) drug therapy: Secondary | ICD-10-CM

## 2022-03-26 DIAGNOSIS — K8689 Other specified diseases of pancreas: Secondary | ICD-10-CM | POA: Diagnosis present

## 2022-03-26 DIAGNOSIS — D6869 Other thrombophilia: Secondary | ICD-10-CM | POA: Diagnosis not present

## 2022-03-26 DIAGNOSIS — Z974 Presence of external hearing-aid: Secondary | ICD-10-CM | POA: Diagnosis not present

## 2022-03-26 DIAGNOSIS — E878 Other disorders of electrolyte and fluid balance, not elsewhere classified: Secondary | ICD-10-CM | POA: Diagnosis present

## 2022-03-26 DIAGNOSIS — E785 Hyperlipidemia, unspecified: Secondary | ICD-10-CM | POA: Diagnosis present

## 2022-03-26 DIAGNOSIS — H9190 Unspecified hearing loss, unspecified ear: Secondary | ICD-10-CM | POA: Diagnosis present

## 2022-03-26 DIAGNOSIS — D6859 Other primary thrombophilia: Secondary | ICD-10-CM | POA: Diagnosis present

## 2022-03-26 DIAGNOSIS — E039 Hypothyroidism, unspecified: Secondary | ICD-10-CM | POA: Diagnosis present

## 2022-03-26 DIAGNOSIS — Z7901 Long term (current) use of anticoagulants: Secondary | ICD-10-CM | POA: Diagnosis not present

## 2022-03-26 DIAGNOSIS — K6389 Other specified diseases of intestine: Secondary | ICD-10-CM | POA: Diagnosis not present

## 2022-03-26 DIAGNOSIS — E876 Hypokalemia: Secondary | ICD-10-CM | POA: Diagnosis present

## 2022-03-26 DIAGNOSIS — I700 Atherosclerosis of aorta: Secondary | ICD-10-CM | POA: Diagnosis not present

## 2022-03-26 DIAGNOSIS — F17290 Nicotine dependence, other tobacco product, uncomplicated: Secondary | ICD-10-CM | POA: Diagnosis present

## 2022-03-26 DIAGNOSIS — E440 Moderate protein-calorie malnutrition: Secondary | ICD-10-CM | POA: Diagnosis present

## 2022-03-26 DIAGNOSIS — L89151 Pressure ulcer of sacral region, stage 1: Secondary | ICD-10-CM | POA: Diagnosis present

## 2022-03-26 DIAGNOSIS — R634 Abnormal weight loss: Secondary | ICD-10-CM | POA: Diagnosis present

## 2022-03-26 DIAGNOSIS — Z79890 Hormone replacement therapy (postmenopausal): Secondary | ICD-10-CM

## 2022-03-26 DIAGNOSIS — E8809 Other disorders of plasma-protein metabolism, not elsewhere classified: Secondary | ICD-10-CM | POA: Diagnosis present

## 2022-03-26 DIAGNOSIS — E119 Type 2 diabetes mellitus without complications: Secondary | ICD-10-CM | POA: Diagnosis present

## 2022-03-26 DIAGNOSIS — R1084 Generalized abdominal pain: Secondary | ICD-10-CM | POA: Diagnosis not present

## 2022-03-26 DIAGNOSIS — G4733 Obstructive sleep apnea (adult) (pediatric): Secondary | ICD-10-CM | POA: Diagnosis present

## 2022-03-26 DIAGNOSIS — K311 Adult hypertrophic pyloric stenosis: Secondary | ICD-10-CM | POA: Diagnosis not present

## 2022-03-26 DIAGNOSIS — Z8507 Personal history of malignant neoplasm of pancreas: Secondary | ICD-10-CM

## 2022-03-26 DIAGNOSIS — K5989 Other specified functional intestinal disorders: Secondary | ICD-10-CM | POA: Diagnosis not present

## 2022-03-26 DIAGNOSIS — Z888 Allergy status to other drugs, medicaments and biological substances status: Secondary | ICD-10-CM

## 2022-03-26 DIAGNOSIS — E089 Diabetes mellitus due to underlying condition without complications: Secondary | ICD-10-CM | POA: Diagnosis present

## 2022-03-26 DIAGNOSIS — R1030 Lower abdominal pain, unspecified: Secondary | ICD-10-CM | POA: Diagnosis not present

## 2022-03-26 DIAGNOSIS — L8990 Pressure ulcer of unspecified site, unspecified stage: Secondary | ICD-10-CM | POA: Diagnosis present

## 2022-03-26 LAB — URINALYSIS, ROUTINE W REFLEX MICROSCOPIC
Bilirubin Urine: NEGATIVE
Glucose, UA: NEGATIVE mg/dL
Ketones, ur: 20 mg/dL — AB
Leukocytes,Ua: NEGATIVE
Nitrite: NEGATIVE
Protein, ur: 30 mg/dL — AB
RBC / HPF: >50 RBC/hpf — ABNORMAL HIGH (ref 0–5)
Specific Gravity, Urine: 1.025 (ref 1.005–1.030)
pH: 6 (ref 5.0–8.0)

## 2022-03-26 LAB — CBC WITH DIFFERENTIAL/PLATELET
Abs Immature Granulocytes: 0.02 10*3/uL (ref 0.00–0.07)
Basophils Absolute: 0 10*3/uL (ref 0.0–0.1)
Basophils Relative: 0 %
Eosinophils Absolute: 0.1 10*3/uL (ref 0.0–0.5)
Eosinophils Relative: 1 %
HCT: 37.9 % — ABNORMAL LOW (ref 39.0–52.0)
Hemoglobin: 12.7 g/dL — ABNORMAL LOW (ref 13.0–17.0)
Immature Granulocytes: 0 %
Lymphocytes Relative: 26 %
Lymphs Abs: 1.7 10*3/uL (ref 0.7–4.0)
MCH: 31.7 pg (ref 26.0–34.0)
MCHC: 33.5 g/dL (ref 30.0–36.0)
MCV: 94.5 fL (ref 80.0–100.0)
Monocytes Absolute: 0.8 10*3/uL (ref 0.1–1.0)
Monocytes Relative: 13 %
Neutro Abs: 4 10*3/uL (ref 1.7–7.7)
Neutrophils Relative %: 60 %
Platelets: 367 10*3/uL (ref 150–400)
RBC: 4.01 MIL/uL — ABNORMAL LOW (ref 4.22–5.81)
RDW: 14 % (ref 11.5–15.5)
WBC: 6.7 10*3/uL (ref 4.0–10.5)
nRBC: 0 % (ref 0.0–0.2)

## 2022-03-26 LAB — COMPREHENSIVE METABOLIC PANEL
ALT: 34 U/L (ref 0–44)
AST: 37 U/L (ref 15–41)
Albumin: 3.1 g/dL — ABNORMAL LOW (ref 3.5–5.0)
Alkaline Phosphatase: 121 U/L (ref 38–126)
Anion gap: 11 (ref 5–15)
BUN: 27 mg/dL — ABNORMAL HIGH (ref 8–23)
CO2: 31 mmol/L (ref 22–32)
Calcium: 8.1 mg/dL — ABNORMAL LOW (ref 8.9–10.3)
Chloride: 91 mmol/L — ABNORMAL LOW (ref 98–111)
Creatinine, Ser: 1.14 mg/dL (ref 0.61–1.24)
GFR, Estimated: >60 mL/min (ref >60)
Glucose, Bld: 115 mg/dL — ABNORMAL HIGH (ref 70–99)
Potassium: 3.1 mmol/L — ABNORMAL LOW (ref 3.5–5.1)
Sodium: 133 mmol/L — ABNORMAL LOW (ref 135–145)
Total Bilirubin: 1.7 mg/dL — ABNORMAL HIGH (ref 0.3–1.2)
Total Protein: 6.4 g/dL — ABNORMAL LOW (ref 6.5–8.1)

## 2022-03-26 LAB — LACTIC ACID, PLASMA: Lactic Acid, Venous: 1 mmol/L (ref 0.5–1.9)

## 2022-03-26 LAB — LIPASE, BLOOD: Lipase: 20 U/L (ref 11–51)

## 2022-03-26 LAB — GLUCOSE, CAPILLARY: Glucose-Capillary: 116 mg/dL — ABNORMAL HIGH (ref 70–99)

## 2022-03-26 MED ORDER — ACETAMINOPHEN 650 MG RE SUPP: 650.0000 mg | Freq: Four times a day (QID) | RECTAL | Status: DC | PRN

## 2022-03-26 MED ORDER — ONDANSETRON HCL 4 MG PO TABS: 4.0000 mg | ORAL_TABLET | Freq: Four times a day (QID) | ORAL | Status: DC | PRN

## 2022-03-26 MED ORDER — MORPHINE SULFATE (PF) 2 MG/ML IV SOLN
2.0000 mg | INTRAVENOUS | Status: DC | PRN
  Administered 2022-03-26 – 2022-03-27 (×2): 2 mg via INTRAVENOUS
  Filled 2022-03-26 (×2): qty 1

## 2022-03-26 MED ORDER — POTASSIUM CHLORIDE IN NACL 40-0.9 MEQ/L-% IV SOLN
INTRAVENOUS | Status: DC
  Filled 2022-03-26 (×2): qty 1000

## 2022-03-26 MED ORDER — SODIUM CHLORIDE 0.9 % IV BOLUS
1000.0000 mL | Freq: Once | INTRAVENOUS | Status: AC
  Administered 2022-03-26: 1000 mL via INTRAVENOUS

## 2022-03-26 MED ORDER — SODIUM BICARBONATE 650 MG PO TABS
650.0000 mg | ORAL_TABLET | Freq: Two times a day (BID) | ORAL | Status: DC
  Administered 2022-03-26 – 2022-03-27 (×3): 650 mg via ORAL
  Filled 2022-03-26 (×3): qty 1

## 2022-03-26 MED ORDER — ONDANSETRON HCL 4 MG/2ML IJ SOLN
4.0000 mg | Freq: Four times a day (QID) | INTRAMUSCULAR | Status: DC | PRN
  Administered 2022-03-26: 4 mg via INTRAVENOUS
  Filled 2022-03-26: qty 2

## 2022-03-26 MED ORDER — LEVOTHYROXINE SODIUM 75 MCG PO TABS
175.0000 ug | ORAL_TABLET | Freq: Every day | ORAL | Status: DC
  Administered 2022-03-27 – 2022-03-30 (×4): 175 ug via ORAL
  Filled 2022-03-26 (×4): qty 1

## 2022-03-26 MED ORDER — PANCRELIPASE (LIP-PROT-AMYL) 12000-38000 UNITS PO CPEP
36000.0000 [IU] | ORAL_CAPSULE | Freq: Three times a day (TID) | ORAL | Status: DC
  Administered 2022-03-30: 36000 [IU] via ORAL
  Filled 2022-03-26 (×5): qty 3

## 2022-03-26 MED ORDER — OXYCODONE HCL 5 MG PO TABS
5.0000 mg | ORAL_TABLET | ORAL | Status: DC | PRN
  Administered 2022-03-29 – 2022-03-30 (×3): 5 mg via ORAL
  Filled 2022-03-26 (×3): qty 1

## 2022-03-26 MED ORDER — INSULIN ASPART 100 UNIT/ML IJ SOLN: 0.0000 [IU] | INTRAMUSCULAR | Status: DC

## 2022-03-26 MED ORDER — ACETAMINOPHEN 325 MG PO TABS: 650.0000 mg | ORAL_TABLET | Freq: Four times a day (QID) | ORAL | Status: DC | PRN

## 2022-03-26 MED ORDER — APIXABAN 5 MG PO TABS
5.0000 mg | ORAL_TABLET | Freq: Two times a day (BID) | ORAL | Status: DC
  Administered 2022-03-26 – 2022-03-27 (×2): 5 mg via ORAL
  Filled 2022-03-26 (×2): qty 1

## 2022-03-26 MED ORDER — CHLORHEXIDINE GLUCONATE CLOTH 2 % EX PADS
6.0000 | MEDICATED_PAD | Freq: Every day | CUTANEOUS | Status: DC
  Administered 2022-03-27 – 2022-03-29 (×3): 6 via TOPICAL

## 2022-03-26 MED ORDER — IOHEXOL 300 MG/ML  SOLN
100.0000 mL | Freq: Once | INTRAMUSCULAR | Status: AC | PRN
  Administered 2022-03-26: 100 mL via INTRAVENOUS

## 2022-03-26 MED ORDER — ONDANSETRON HCL 4 MG/2ML IJ SOLN
4.0000 mg | Freq: Once | INTRAMUSCULAR | Status: AC
  Administered 2022-03-26: 4 mg via INTRAVENOUS
  Filled 2022-03-26: qty 2

## 2022-03-26 NOTE — ED Provider Notes (Signed)
[facility] EMERGENCY DEPARTMENT Provider Note   CSN: [id number] Arrival date & time: 03/26/22  1254     History  Chief Complaint  Patient presents with   Abdominal Pain    Alexander Banks is a 66 y.o. male.   Abdominal Pain Associated symptoms: nausea and vomiting   Associated symptoms: no chest pain, no diarrhea, no fever and no shortness of breath         Alexander Banks is a 66 y.o. male with past medical history of pancreatic cancer with prior Whipple procedure, hypertension, iron deficient anemia, and type 2 diabetes, anticoagulated on Eliquis due to prior DVT who presents to the Emergency Department, sent from PCPs office for evaluation of persistent mid abdominal pain gradually worsening x2 weeks and concerns for possible sepsis/hydration.  Noted to be hypotensive and orthostatic at PCPs office earlier.  Patient notes some mid to lower back pain and 1 episode of vomiting last evening.  He continues to have significant weight loss of 10 to 15 pounds in 1 month.  He is tolerating liquids, but unable to tolerate any solid foods.  Denies any dysuria, chest pain, shortness of breath, syncope fever or chills.  PCP Dr. Wolfgang Phoenix Oncologist, [name]    Home Medications Prior to Admission medications   Medication Sig Start Date End Date Taking? Authorizing Provider  apixaban (ELIQUIS) 5 MG TABS tablet Take 1 tablet (5 mg total) by mouth 2 (two) times daily. 12/19/21  Yes Luking, Elayne Snare, MD  azelastine (ASTELIN) 0.1 % nasal spray Place 2 sprays into both nostrils 2 (two) times daily. Patient taking differently: Place 2 sprays into both nostrils 2 (two) times daily as needed for allergies. 11/23/21  Yes Luking, Elayne Snare, MD  feeding supplement (ENSURE ENLIVE / ENSURE PLUS) LIQD Take 237 mLs by mouth 2 (two) times daily between meals. 03/08/22  Yes Emokpae, Courage, MD  insulin detemir (LEVEMIR) 100 UNIT/ML FlexPen Inject 6 Units into the skin at bedtime. 02/13/22  Yes Nida,  Marella Chimes, MD  insulin lispro (HUMALOG KWIKPEN) 100 UNIT/ML KwikPen Inject 4-7 Units into the skin 3 (three) times daily before meals. 02/13/22  Yes Cassandria Anger, MD  levothyroxine (SYNTHROID) 175 MCG tablet TAKE 1 TABLET DAILY Patient taking differently: Take 175 mcg by mouth daily before breakfast. 10/17/21  Yes Luking, Elayne Snare, MD  lipase/protease/amylase (CREON) 36000 UNITS CPEP capsule Take 2 capsules (72,000 Units total) by mouth 3 (three) times daily with meals. May also take 1 capsule (36,000 Units total) as needed (with snacks). 03/07/22  Yes Emokpae, Courage, MD  OVER THE COUNTER MEDICATION Magnesium   Yes [provider]  oxyCODONE (OXY IR/ROXICODONE) 5 MG immediate release tablet 1 to 1 and 1/2 q4 prn severe pain 03/13/22  Yes Luking, Scott A, MD  sennosides-docusate sodium (SENOKOT-S) 8.6-50 MG tablet Take 1 tablet by mouth daily.   Yes [provider]  sildenafil (VIAGRA) 100 MG tablet TAKE 1/2 TO 1 TABLET BY MOUTH AS NEEDED FOR ERECTILE DYSFUNCTION Patient taking differently: Take 50-100 mg by mouth as needed for erectile dysfunction. 12/03/21  Yes [name], MD  sodium bicarbonate 650 MG tablet Take 1 tablet (650 mg total) by mouth 2 (two) times daily. 03/07/22  Yes Emokpae, Courage, MD  Continuous Blood Gluc Sensor (FREESTYLE LIBRE 3 SENSOR) MISC 1 Piece by Does not apply route every 14 (fourteen) days. Place 1 sensor on the skin every 14 days. Use to check glucose continuously Dx E11.65 10/22/21  Cassandria Anger, MD  lactulose (CHRONULAC) 10 GM/15ML solution Take 30 mLs (20 g total) by mouth daily. Patient not taking: Reported on 03/26/2022 03/07/22   Roxan Hockey, MD  tamsulosin (FLOMAX) 0.4 MG CAPS capsule Take 1 capsule (0.4 mg total) by mouth daily after supper. Patient not taking: Reported on 03/26/2022 03/07/22   Roxan Hockey, MD      Allergies    Lisinopril    Review of Systems   Review of Systems  Constitutional:   Positive for appetite change. Negative for diaphoresis and fever.  Respiratory:  Negative for shortness of breath.   Cardiovascular:  Negative for chest pain.  Gastrointestinal:  Positive for abdominal pain, nausea and vomiting. Negative for abdominal distention, blood in stool and diarrhea.  Genitourinary:  Negative for decreased urine volume, difficulty urinating and flank pain.  Musculoskeletal:  Positive for back pain.  Skin:  Negative for rash and wound.  Neurological:  Positive for weakness. Negative for dizziness, syncope, light-headedness and headaches.    Physical Exam Updated Vital Signs BP 129/75   Pulse 89   Temp 98.3 F (36.8 C) (Oral)   Resp 15   Ht '6\' 2"'$  (1.88 m)   Wt 68 kg   SpO2 91%   BMI 19.26 kg/m  Physical Exam Vitals and nursing note reviewed.  Constitutional:      General: He is not in acute distress.    Appearance: He is well-developed. He is ill-appearing.  HENT:     Mouth/Throat:     Mouth: Mucous membranes are moist.  Eyes:     General: No scleral icterus. Cardiovascular:     Rate and Rhythm: Normal rate and regular rhythm.     Pulses: Normal pulses.  Pulmonary:     Effort: Pulmonary effort is normal. No respiratory distress.  Abdominal:     General: There is no distension.     Palpations: Abdomen is soft. There is no mass.     Tenderness: There is abdominal tenderness. There is no right CVA tenderness, left CVA tenderness or guarding.     Comments: Diffuse tenderness of the mid abdomen.  No abdominal distention, palpable mass guarding or rebound tenderness.  Musculoskeletal:        General: No swelling. Normal range of motion.     Right lower leg: No edema.     Left lower leg: No edema.  Skin:    General: Skin is warm.     Capillary Refill: Capillary refill takes less than 2 seconds.     Findings: No erythema or rash.  Neurological:     General: No focal deficit present.     Mental Status: He is alert.     Sensory: No sensory deficit.      Motor: No weakness.     ED Results / Procedures / Treatments   Labs (all labs ordered are listed, but only abnormal results are displayed) Labs Reviewed  COMPREHENSIVE METABOLIC PANEL - Abnormal; Notable for the following components:      Result Value   Sodium 133 (*)    Potassium 3.1 (*)    Chloride 91 (*)    Glucose, Bld 115 (*)    BUN 27 (*)    Calcium 8.1 (*)    Total Protein 6.4 (*)    Albumin 3.1 (*)    Total Bilirubin 1.7 (*)    All other components within normal limits  CBC WITH DIFFERENTIAL/PLATELET - Abnormal; Notable for the following components:   RBC 4.01 (*)  Hemoglobin 12.7 (*)    HCT 37.9 (*)    All other components within normal limits  LIPASE, BLOOD  LACTIC ACID, PLASMA  URINALYSIS, ROUTINE W REFLEX MICROSCOPIC    EKG None  Radiology CT ABDOMEN PELVIS W CONTRAST  Result Date: 03/26/2022 CLINICAL DATA:  Pancreatic cancer, biliary stent. Nausea, vomiting, and abdominal pain. Weight loss. * Tracking Code: BO * EXAM: CT ABDOMEN AND PELVIS WITH CONTRAST TECHNIQUE: Multidetector CT imaging of the abdomen and pelvis was performed using the standard protocol following bolus administration of intravenous contrast. RADIATION DOSE REDUCTION: This exam was performed according to the departmental dose-optimization program which includes automated exposure control, adjustment of the mA and/or kV according to patient size and/or use of iterative reconstruction technique. CONTRAST:  148m OMNIPAQUE IOHEXOL 300 MG/ML  SOLN COMPARISON:  PET-CT 02/21/2022 and CT abdomen 01/29/2022 FINDINGS: Lower chest: Unremarkable Hepatobiliary: Percutaneous biliary drain in place with loop formed in the right bile duct but near the confluence of the biliary tree. This is also close to the proximal portion of a biliary stent. There is still moderate intrahepatic biliary dilatation, although improved from 01/29/2022. I do not see a well-defined liver lesion corresponding with the focus of  accentuated metabolic activity shown on prior PET-CT. Highly indistinct porta hepatis regions with ill definition of tissue planes. Mild perihepatic ascites. Pancreas: Dilated dorsal pancreatic duct extending into an indistinctly marginated pancreatic head lesion presumably reflecting the underlying tumor, with ill-defined hypodensity in this region measuring about 3.5 by 1.9 cm on image 24 of series 2, and abutting the margin of the SMA although not completely wrapping around the SMA. The prior lesser sac fluid collection shown on 01/29/2022 is no longer present but there are bandlike cords of soft tissue density below the pancreatic body as on image 39 of series 5 which could be due to local tumor extension, inflammation, or less likely infection. Spleen: Unremarkable Adrenals/Urinary Tract: Stable small renal cyst do not require individual follow up. Urinary bladder unremarkable. No hydronephrosis. Adrenal glands unremarkable. Stomach/Bowel: Notably distended stomach. Narrowed gastrojejunostomy on images 32 through 37 of series 2, significance uncertain but relative outlet obstruction along the gastrojejunostomy cannot be excluded. Ill definition of the small bowel receiving pancreatic and biliary drainage. Borderline prominence of stool in the colon. A loop of bowel containing an air-fluid level extends anterior to the left hepatic lobe on image 21 series 2, this is more cephalad than prior extension of bowel. Vascular/Lymphatic: Atherosclerosis is present, including aortoiliac atherosclerotic disease. Narrowing of the proximal celiac trunk possibly due to median arcuate ligament and atheromatous vascular disease. There is also mild atherosclerotic narrowing of the proximal SMA. As noted above, indistinct hypodense soft tissue abuts the right margin of the SMA on image 24 series 2 may represent tumor. Collateral vessels extend around the stomach. Reproductive: Mild prostatomegaly. Other: There is some mild hazy  stranding in central mesentery more notable indistinct stranding along the porta hepatis. Trace perihepatic ascites. Musculoskeletal: Bridging spurring of both sacroiliac joints. Lower lumbar degenerative facet arthropathy. Lumbar impingement at L4-5 due to spondylosis and degenerative disc disease. IMPRESSION: 1. Notably distended stomach with narrowed/angulated appearance of the gastrojejunostomy, significance uncertain but relative outlet obstruction along the gastrojejunostomy cannot be excluded. 2. Ill definition of the porta hepatis regions with ill definition of tissue planes. Indistinct hypodense soft tissue in the region of the pancreatic head and abutting the right margin of the SMA, compatible tumor. 3. Prior lesser sac fluid collection is no longer present but there  are bandlike cords of soft tissue density below the pancreatic body which could be due to local tumor extension, inflammation, or less likely infection. 4. Percutaneous biliary drain in place with loop formed in the right bile duct but near the confluence of the biliary tree. Adjacent stent in the common hepatic duct. There is still moderate intrahepatic biliary dilatation, although improved from 01/29/2022. 5. Mild perihepatic ascites. 6. Narrowing of the proximal celiac trunk and proximal SMA due to median arcuate ligament and atheromatous vascular disease. 7. Lumbar impingement at L4-5 due to spondylosis and degenerative disc disease. 8. Mild prostatomegaly. 9. Borderline prominence of stool in the colon. 10. Aortic atherosclerosis. Aortic Atherosclerosis (ICD10-I70.0). Electronically Signed   By: Van Clines M.D.   On: 03/26/2022 17:22    Procedures Procedures    Medications Ordered in ED Medications  sodium chloride 0.9 % bolus 1,000 mL (0 mLs Intravenous Stopped 03/26/22 1742)  ondansetron (ZOFRAN) injection 4 mg (4 mg Intravenous Given 03/26/22 1746)  iohexol (OMNIPAQUE) 300 MG/ML solution 100 mL (100 mLs Intravenous  Contrast Given 03/26/22 1653)  sodium chloride 0.9 % bolus 1,000 mL (1,000 mLs Intravenous New Bag/Given 03/26/22 1746)    ED Course/ Medical Decision Making/ A&P                           Medical Decision Making Patient here with complex history including pancreatic cancer and prior Whipple procedure.  Sent from PCPs office for evaluation of possible dehydration, hypotension and abdominal pain.  Symptoms gradually worsening x2 weeks.  Vomited last evening.  Unable to tolerate solid foods.  On my exam, patient ill-appearing,  initial blood pressure is soft 379 systolic.  Initially evaluated in triage.  He has some diffuse mid abdominal tenderness and discomfort of his mid to lower back.  No active vomiting during my exam.  Patient currently seen by PCP and by oncology, [name].  There is a concern for sepsis secondary to abdominal process.  He is afebrile here without tachycardia.  We will check labs, administer IV fluids, and CT abdomen and pelvis.  Amount and/or Complexity of Data Reviewed Labs: ordered.    Details: Labs interpreted by me, no evidence of leukocytosis.  Hemoglobin 12.7, lactic acid reassuring at 1.  Lipase unremarkable.  Chemistries show mild hypokalemia with potassium of 3.1.  His potassium was elevated at 5.42 weeks ago.  BUN elevated at 27, serum creatinine 1.14.  Total bilirubin elevated at 1.7 but near baseline.  Urinalysis with large amount of blood, proteinuria, no evidence of infection. Radiology: ordered.    Details: CT imaging of the abdomen and pelvis shows a notably distended stomach with a narrowed or angulated appearance of the gastrojejunostomy with possible outlet obstruction ECG/medicine tests: ordered.    Details: EKG shows sinus rhythm Discussion of management or test interpretation with external provider(s): Work-up today shows possible outlet obstruction with narrowing at gastrojejunostomy site.  He is followed by PCP and by oncology as well.  Dr.  Delton Coombes.  His work-up today also shows likely dehydration.  His blood pressure was soft on arrival but has improved nicely after IV fluids.  No active vomiting, nausea has somewhat improved after IV ondansetron.  I have consulted local general surgery, Dr. Arnoldo Morale and discussed findings.  He recommends hospitalist admission, NG tube, he will consult tomorrow .  I have discussed this with patient and spouse who is at bedside, they are agreeable to plan for admission and NG  tube, but prefer if any surgical intervention is necessary that he be transferred to [facility], Dr. Basilia Jumbo  Risk Prescription drug management.           Final Clinical Impression(s) / ED Diagnoses Final diagnoses:  Abdominal pain, unspecified abdominal location    Rx / DC Orders ED Discharge Orders     None         Kem Parkinson, PA-C 03/26/22 Braselton, Ankit, MD 03/26/22 2328

## 2022-03-26 NOTE — Progress Notes (Signed)
   Subjective:    Patient ID: Alexander Banks, male    DOB: [birth year], 66 y.o.   MRN: [id number]  HPI Pt arrives with abdominal pain. Pt recently had stint put in. External drain is still in placed; will be taking out next Wednesday. Pt wife reports stomach cramping and nausea for about one week. Yesterday wife fixed mashed potatoes and pt immediately vomiting. Pt still having back pain. Pt reports lightheadedness. No fever. No s/s infection.   Patient urinating some not drinking as much is normal not eating as much over the past few days intense abdominal pain radiates to his back relates a lot of stomach cramping nausea getting weaker Review of Systems     Objective:   Physical Exam General-in no acute distress Eyes-no discharge Lungs-respiratory rate normal, CTA CV-no murmurs,RRR Extremities skin warm dry no edema Neuro grossly normal Behavior normal, alert  Abd tender Blood pressure 86/64 sitting and standing 74 over palpable     Assessment & Plan:  1. Pancreatic adenocarcinoma [facility]) Patient has a very difficult situation.  Is gone through chemotherapy which did not totally take care of his problem he has had partial Whipple.  He is also had obstruction of the bile duct which required a external drain but recently interventional radiology replaced this with a internal stent and later this week they will be removing the other 1  Since the placement of this internal stent patient has had increased abdominal pain radiates to the back denies fever not eating well over the past couple days not drinking as much is normal patient is orthostatic in the office  2. Abdominal pain, unspecified abdominal location Patient utilizing pain medicine  Patient because of hypotensive orthostasis as well as abdominal pain weight loss and progressive discomfort Phone consultation with [name]  It is recommended for patient to receive IV fluids also to do CBC CMP lipase and blood  culture Patient may also need to have CT scan to further delineate the abdominal pain. 3. Weight loss Hopefully some of the testing will give results that we will give him something that we can move forward with  Patient does have an appointment with [name] on Thursday  Patient and wife are still hopeful that there are potential treatment measures that could be done to treat this difficult case of pancreatic cancer  ER was spoken with, triage nurse will initiate lab work as well as initiate work-up-if any questions concerns issues or if outpatient coordination of care is needed feel free to call [phone]

## 2022-03-26 NOTE — ED Triage Notes (Signed)
Pt sent by Dr Wolfgang Phoenix, pt seen mid abdominal pain x couple of weeks, Pt vomited x 1 last night. Pt was hypotensive at PCP. Dr Wolfgang Phoenix note in [place]

## 2022-03-27 DIAGNOSIS — K311 Adult hypertrophic pyloric stenosis: Secondary | ICD-10-CM

## 2022-03-27 DIAGNOSIS — E876 Hypokalemia: Secondary | ICD-10-CM | POA: Diagnosis present

## 2022-03-27 DIAGNOSIS — L8990 Pressure ulcer of unspecified site, unspecified stage: Secondary | ICD-10-CM | POA: Insufficient documentation

## 2022-03-27 DIAGNOSIS — R1084 Generalized abdominal pain: Secondary | ICD-10-CM | POA: Diagnosis not present

## 2022-03-27 DIAGNOSIS — F17200 Nicotine dependence, unspecified, uncomplicated: Secondary | ICD-10-CM | POA: Diagnosis present

## 2022-03-27 LAB — CBC WITH DIFFERENTIAL/PLATELET
Abs Immature Granulocytes: 0.03 10*3/uL (ref 0.00–0.07)
Basophils Absolute: 0 10*3/uL (ref 0.0–0.1)
Basophils Relative: 1 %
Eosinophils Absolute: 0.1 10*3/uL (ref 0.0–0.5)
Eosinophils Relative: 1 %
HCT: 37.3 % — ABNORMAL LOW (ref 39.0–52.0)
Hemoglobin: 12.3 g/dL — ABNORMAL LOW (ref 13.0–17.0)
Immature Granulocytes: 1 %
Lymphocytes Relative: 25 %
Lymphs Abs: 1.4 10*3/uL (ref 0.7–4.0)
MCH: 31.5 pg (ref 26.0–34.0)
MCHC: 33 g/dL (ref 30.0–36.0)
MCV: 95.6 fL (ref 80.0–100.0)
Monocytes Absolute: 0.7 10*3/uL (ref 0.1–1.0)
Monocytes Relative: 12 %
Neutro Abs: 3.4 10*3/uL (ref 1.7–7.7)
Neutrophils Relative %: 60 %
Platelets: 317 10*3/uL (ref 150–400)
RBC: 3.9 MIL/uL — ABNORMAL LOW (ref 4.22–5.81)
RDW: 14 % (ref 11.5–15.5)
WBC: 5.6 10*3/uL (ref 4.0–10.5)
nRBC: 0 % (ref 0.0–0.2)

## 2022-03-27 LAB — COMPREHENSIVE METABOLIC PANEL
ALT: 31 U/L (ref 0–44)
AST: 32 U/L (ref 15–41)
Albumin: 2.5 g/dL — ABNORMAL LOW (ref 3.5–5.0)
Alkaline Phosphatase: 100 U/L (ref 38–126)
Anion gap: 10 (ref 5–15)
BUN: 22 mg/dL (ref 8–23)
CO2: 28 mmol/L (ref 22–32)
Calcium: 7.7 mg/dL — ABNORMAL LOW (ref 8.9–10.3)
Chloride: 98 mmol/L (ref 98–111)
Creatinine, Ser: 1 mg/dL (ref 0.61–1.24)
GFR, Estimated: >60 mL/min (ref >60)
Glucose, Bld: 105 mg/dL — ABNORMAL HIGH (ref 70–99)
Potassium: 3.6 mmol/L (ref 3.5–5.1)
Sodium: 136 mmol/L (ref 135–145)
Total Bilirubin: 1.6 mg/dL — ABNORMAL HIGH (ref 0.3–1.2)
Total Protein: 5.4 g/dL — ABNORMAL LOW (ref 6.5–8.1)

## 2022-03-27 LAB — GLUCOSE, CAPILLARY
Glucose-Capillary: 101 mg/dL — ABNORMAL HIGH (ref 70–99)
Glucose-Capillary: 102 mg/dL — ABNORMAL HIGH (ref 70–99)
Glucose-Capillary: 105 mg/dL — ABNORMAL HIGH (ref 70–99)
Glucose-Capillary: 106 mg/dL — ABNORMAL HIGH (ref 70–99)
Glucose-Capillary: 110 mg/dL — ABNORMAL HIGH (ref 70–99)
Glucose-Capillary: 114 mg/dL — ABNORMAL HIGH (ref 70–99)

## 2022-03-27 LAB — MAGNESIUM: Magnesium: 1.8 mg/dL (ref 1.7–2.4)

## 2022-03-27 MED ORDER — MENTHOL 3 MG MT LOZG
1.0000 | LOZENGE | OROMUCOSAL | Status: DC | PRN
  Filled 2022-03-27: qty 9

## 2022-03-27 MED ORDER — NICOTINE 21 MG/24HR TD PT24
21.0000 mg | MEDICATED_PATCH | Freq: Every day | TRANSDERMAL | Status: DC
  Filled 2022-03-27 (×2): qty 1

## 2022-03-27 NOTE — H&P (Signed)
History and Physical    Patient: Alexander Banks JYN:[id number] DOB: April 24, 1956 DOA: 03/26/2022 DOS: the patient was seen and examined on 03/27/2022 PCP: [name], MD  Patient coming from: Home  Chief Complaint:  Chief Complaint  Patient presents with   Abdominal Pain   HPI: Alexander Banks is a 66 y.o. male with medical history significant of iron deficiency anemia, diabetes mellitus type 2, hyperlipidemia, hypertension, prediabetes, and more presents to the ED with a chief complaint of abdominal and back pain.  Patient reports that started several weeks ago.  Its been worse since it started.  He just cannot take the pain anymore.  He reports the pain in his abdomen feels like a pressure.  It is constant.  Oxycodone helped it minimally.  He has had nausea and vomiting x1 that is nonbloody.  He has not had diarrhea.  Patient reports his bowel movements have been irregular for about a week but prior to that he was feeling pretty constipated.  He has had no hematochezia or melena.  Patient reports he continues remember when his last normal meal was.  He has no appetite.  The last time he had any p.o. solid food intake was 3 days ago.  In the interim he has been drinking water and Gatorade.  Patient reports the pain in his abdomen and pain is back for 2 separate pains.  The one in his abdomen feels like pressure.  The one in his back feels like a muscle spasm that is constant.  Oxycodone helps the 1 in his back slightly as well.  He has not had a fever.  He has no new pains.  He has no dysuria.  Patient reports at this time his pain is resolved and he is not sure what made it go away.  Patient denies any numbness.  He is in full control of his urine and bowels.  Patient has no other complaints at this time.  Patient does smoke, does not drink alcohol, does not use illicit drugs, is not vaccinated for COVID, is full code. Review of Systems: As mentioned in the history of present illness. All  other systems reviewed and are negative. Past Medical History:  Diagnosis Date   Anemia, iron deficiency    Diabetes mellitus without complication ([place])    on meds   Hearing loss    Bil hearing aids   Hyperlipidemia    Hypertension    Port-A-Cath in place 12/26/2020   Prediabetes    Sleep apnea    Past Surgical History:  Procedure Laterality Date   BILIARY STENT PLACEMENT N/A 11/09/2020   Procedure: BILIARY STENT PLACEMENT - 74 F X 5 CM STENT;  Surgeon: Rogene Houston, MD;  Location: [facility];  Service: Endoscopy;  Laterality: N/A;   BILIARY STENT PLACEMENT N/A 12/27/2020   Procedure: BILIARY STENT PLACEMENT;  Surgeon: Rogene Houston, MD;  Location: [facility];  Service: Endoscopy;  Laterality: N/A;   COLONOSCOPY     ENTEROSCOPY N/A 12/27/2021   Procedure: ENTEROSCOPY;  Surgeon: Mansouraty, Telford Nab., MD;  Location: [facility] ENDOSCOPY;  Service: Gastroenterology;  Laterality: N/A;   ERCP N/A 11/09/2020   Procedure: ENDOSCOPIC RETROGRADE CHOLANGIOPANCREATOGRAPHY (ERCP);  Surgeon: Rogene Houston, MD;  Location: [facility];  Service: Endoscopy;  Laterality: N/A;   ERCP N/A 12/27/2020   Procedure: ENDOSCOPIC RETROGRADE CHOLANGIOPANCREATOGRAPHY (ERCP);  Surgeon: Rogene Houston, MD;  Location: [facility];  Service: Endoscopy;  Laterality: N/A;   ESOPHAGOGASTRODUODENOSCOPY (EGD) WITH PROPOFOL N/A  11/30/2020   Procedure: ESOPHAGOGASTRODUODENOSCOPY (EGD) WITH PROPOFOL;  Surgeon: Milus Banister, MD;  Location: [facility];  Service: Endoscopy;  Laterality: N/A;   EUS N/A 11/30/2020   Procedure: UPPER ENDOSCOPIC ULTRASOUND (EUS) RADIAL;  Surgeon: Milus Banister, MD;  Location: [facility];  Service: Endoscopy;  Laterality: N/A;   FINE NEEDLE ASPIRATION N/A 11/30/2020   Procedure: FINE NEEDLE ASPIRATION (FNA) LINEAR;  Surgeon: Milus Banister, MD;  Location: [facility];  Service: Endoscopy;  Laterality: N/A;   GIVENS CAPSULE STUDY     IR BILIARY DRAIN PLACEMENT WITH CHOLANGIOGRAM  02/04/2022   IR  BILIARY STENT(S) EXISTING ACCESS INC DILATION CATH EXCHANGE  03/15/2022   IR IMAGING GUIDED PORT INSERTION  12/18/2020   IR PATIENT EVAL TECH 0-60 MINS  03/08/2022   KNEE SURGERY     4 surgeries on left, 2 on right knee   SHOULDER SURGERY     right shoulder   SPHINCTEROTOMY N/A 11/09/2020   Procedure: SHORT BILIARY SPHINCTEROTOMY;  Surgeon: Rogene Houston, MD;  Location: [facility];  Service: Endoscopy;  Laterality: N/A;   WHIPPLE PROCEDURE     Social History:  reports that he has been smoking cigars. He has never used smokeless tobacco. He reports current alcohol use. He reports that he does not use drugs.  Allergies  Allergen Reactions   Lisinopril Cough    Family History  Problem Relation Age of Onset   Heart attack Father    Heart attack Paternal Grandfather    Leukemia Sister    Heart attack Brother     Prior to Admission medications   Medication Sig Start Date End Date Taking? Authorizing Provider  apixaban (ELIQUIS) 5 MG TABS tablet Take 1 tablet (5 mg total) by mouth 2 (two) times daily. 12/19/21  Yes Luking, Elayne Snare, MD  azelastine (ASTELIN) 0.1 % nasal spray Place 2 sprays into both nostrils 2 (two) times daily. Patient taking differently: Place 2 sprays into both nostrils 2 (two) times daily as needed for allergies. 11/23/21  Yes Luking, Elayne Snare, MD  feeding supplement (ENSURE ENLIVE / ENSURE PLUS) LIQD Take 237 mLs by mouth 2 (two) times daily between meals. 03/08/22  Yes Emokpae, Courage, MD  insulin detemir (LEVEMIR) 100 UNIT/ML FlexPen Inject 6 Units into the skin at bedtime. 02/13/22  Yes Nida, Marella Chimes, MD  insulin lispro (HUMALOG KWIKPEN) 100 UNIT/ML KwikPen Inject 4-7 Units into the skin 3 (three) times daily before meals. 02/13/22  Yes Cassandria Anger, MD  levothyroxine (SYNTHROID) 175 MCG tablet TAKE 1 TABLET DAILY Patient taking differently: Take 175 mcg by mouth daily before breakfast. 10/17/21  Yes Luking, Elayne Snare, MD  lipase/protease/amylase (CREON)  36000 UNITS CPEP capsule Take 2 capsules (72,000 Units total) by mouth 3 (three) times daily with meals. May also take 1 capsule (36,000 Units total) as needed (with snacks). 03/07/22  Yes Emokpae, Courage, MD  OVER THE COUNTER MEDICATION Magnesium   Yes [provider]  oxyCODONE (OXY IR/ROXICODONE) 5 MG immediate release tablet 1 to 1 and 1/2 q4 prn severe pain 03/13/22  Yes Luking, Scott A, MD  sennosides-docusate sodium (SENOKOT-S) 8.6-50 MG tablet Take 1 tablet by mouth daily.   Yes [provider]  sildenafil (VIAGRA) 100 MG tablet TAKE 1/2 TO 1 TABLET BY MOUTH AS NEEDED FOR ERECTILE DYSFUNCTION Patient taking differently: Take 50-100 mg by mouth as needed for erectile dysfunction. 12/03/21  Yes Luking, Elayne Snare, MD  sodium bicarbonate 650 MG tablet Take 1 tablet (650  mg total) by mouth 2 (two) times daily. 03/07/22  Yes Emokpae, Courage, MD  Continuous Blood Gluc Sensor (FREESTYLE LIBRE 3 SENSOR) MISC 1 Piece by Does not apply route every 14 (fourteen) days. Place 1 sensor on the skin every 14 days. Use to check glucose continuously Dx E11.65 10/22/21   Cassandria Anger, MD  lactulose (CHRONULAC) 10 GM/15ML solution Take 30 mLs (20 g total) by mouth daily. Patient not taking: Reported on 03/26/2022 03/07/22   Roxan Hockey, MD  tamsulosin (FLOMAX) 0.4 MG CAPS capsule Take 1 capsule (0.4 mg total) by mouth daily after supper. Patient not taking: Reported on 03/26/2022 03/07/22   Roxan Hockey, MD    Physical Exam: Vitals:   03/26/22 2045 03/26/22 2110 03/26/22 2118 03/27/22 0438  BP:  139/84  127/83  Pulse: 93 94  90  Resp: '12 19  18  '$ Temp:  98 F (36.7 C)  98.2 F (36.8 C)  TempSrc:    Oral  SpO2: 96% 97%  97%  Weight:   69.2 kg   Height:   '6\' 2"'$  (1.88 m)    1.  General: Patient lying supine in bed, appears underweight   2. Psychiatric: Alert and oriented x 3, mood and behavior normal for situation, pleasant and cooperative with exam   3.  Neurologic: Speech and language are normal, face is symmetric, moves all 4 extremities voluntarily, at baseline without acute deficits on limited exam   4. HEENMT:  Hard of hearing, head is atraumatic, normocephalic, pupils reactive to light, neck is supple, trachea is midline, mucous membranes are moist   5. Respiratory : Lungs are clear to auscultation bilaterally without wheezing, rhonchi, rales, no cyanosis, no increase in work of breathing or accessory muscle use   6. Cardiovascular : Heart rate normal, rhythm is regular, no murmurs, rubs or gallops, no peripheral edema, peripheral pulses palpated   7. Gastrointestinal:  Abdomen is soft, nondistended, nontender to palpation bowel sounds active, no masses or organomegaly palpated   8. Skin:  Skin is warm, dry and intact without rashes, acute lesions, or ulcers on limited exam   9.Musculoskeletal:  No acute deformities or trauma, no asymmetry in tone, no peripheral edema, peripheral pulses palpated, no tenderness to palpation in the extremities  Data Reviewed: In the ED Temp 98.1, heart rate 82-95, respiratory rate 11-21, blood pressure 82/58-132/81, satting at 98% No leukocytosis with a white blood cell count of 6.7, hemoglobin 12.7, platelets 367 Chemistry reveals a hypokalemia 3.1 and hypoalbuminemia with an albumin of 3.1 UA shows hematuria 2 L bolus given in the ED Patient was barely able to stand for orthostatics Chart review reveals that patient was on Eliquis for blood clot, will be held until general surgery evaluation Continue to monitor  Assessment and Plan: * Abdominal pain - CT abdomen shows a distended stomach with narrowed appearance of gastrojejunostomy.  Extension of known tumor.  Percutaneous biliary drain performed in the right bile duct, adjacent to the stent in the common bile duct with some biliary dilatation improved from January 29, 2022 - General surgery was consulted and recommended NG tube - NG tube  in place on low intermittent suction - Surgery plans to see patient in the a.m. - In the meantime control pain with pain scale - If patient has to undergo for surgical procedure he would rather go back to [place] where he had had his [place] done, if it is medical management he would rather stay here at [place]  to monitor  Tobacco use disorder - Patient is a current smoker. - Nicotine patch ordered - Encouraged regarding cessation  Hypokalemia - Continue IV fluids throughout the night with 40 mEq potassium   Diabetes mellitus secondary to pancreatic insufficiency (HCC) - Patient takes 6 units of basal insulin at baseline - Hold basal insulin, sliding scale coverage - Of note, patient has not had a good appetite and has not been eating  Hypothyroidism - Continue Synthroid      Advance Care Planning:   Code Status: Full Code   Consults: General surgery  Family Communication: No family at bedside  Severity of Illness: The appropriate patient status for this patient is INPATIENT. Inpatient status is judged to be reasonable and necessary in order to provide the required intensity of service to ensure the patient's safety. The patient's presenting symptoms, physical exam findings, and initial radiographic and laboratory data in the context of their chronic comorbidities is felt to place them at high risk for further clinical deterioration. Furthermore, it is not anticipated that the patient will be medically stable for discharge from the hospital within 2 midnights of admission.   * I certify that at the point of admission it is my clinical judgment that the patient will require inpatient hospital care spanning beyond 2 midnights from the point of admission due to high intensity of service, high risk for further deterioration and high frequency of surveillance required.*  Author: Rolla Plate, DO 03/27/2022 4:49 AM  For on call review [email or web address].

## 2022-03-27 NOTE — Progress Notes (Signed)
ASSUMPTION OF CARE NOTE   03/27/2022 2:48 PM  Alexander Banks was seen and examined.  The H&P by the admitting provider, orders, imaging was reviewed.  Please see new orders.  Will continue to follow.   Vitals:   03/27/22 0438 03/27/22 1359  BP: 127/83 127/83  Pulse: 90 94  Resp: 18   Temp: 98.2 F (36.8 C) 98.4 F (36.9 C)  SpO2: 97% 99%    Results for orders placed or performed during the hospital encounter of 03/26/22  Lipase, blood  Result Value Ref Range   Lipase 20 11 - 51 U/L  Comprehensive metabolic panel  Result Value Ref Range   Sodium 133 (L) 135 - 145 mmol/L   Potassium 3.1 (L) 3.5 - 5.1 mmol/L   Chloride 91 (L) 98 - 111 mmol/L   CO2 31 22 - 32 mmol/L   Glucose, Bld 115 (H) 70 - 99 mg/dL   BUN 27 (H) 8 - 23 mg/dL   Creatinine, Ser 1.14 0.61 - 1.24 mg/dL   Calcium 8.1 (L) 8.9 - 10.3 mg/dL   Total Protein 6.4 (L) 6.5 - 8.1 g/dL   Albumin 3.1 (L) 3.5 - 5.0 g/dL   AST 37 15 - 41 U/L   ALT 34 0 - 44 U/L   Alkaline Phosphatase 121 38 - 126 U/L   Total Bilirubin 1.7 (H) 0.3 - 1.2 mg/dL   GFR, Estimated >60 >60 mL/min   Anion gap 11 5 - 15  Urinalysis, Routine w reflex microscopic Urine, Clean Catch  Result Value Ref Range   Color, Urine AMBER (A) YELLOW   APPearance HAZY (A) CLEAR   Specific Gravity, Urine 1.025 1.005 - 1.030   pH 6.0 5.0 - 8.0   Glucose, UA NEGATIVE NEGATIVE mg/dL   Hgb urine dipstick LARGE (A) NEGATIVE   Bilirubin Urine NEGATIVE NEGATIVE   Ketones, ur 20 (A) NEGATIVE mg/dL   Protein, ur 30 (A) NEGATIVE mg/dL   Nitrite NEGATIVE NEGATIVE   Leukocytes,Ua NEGATIVE NEGATIVE   RBC / HPF >50 (H) 0 - 5 RBC/hpf   WBC, UA 6-10 0 - 5 WBC/hpf   Bacteria, UA RARE (A) NONE SEEN   Mucus PRESENT   CBC with Differential  Result Value Ref Range   WBC 6.7 4.0 - 10.5 K/uL   RBC 4.01 (L) 4.22 - 5.81 MIL/uL   Hemoglobin 12.7 (L) 13.0 - 17.0 g/dL   HCT 37.9 (L) 39.0 - 52.0 %   MCV 94.5 80.0 - 100.0 fL   MCH 31.7 26.0 - 34.0 pg   MCHC 33.5 30.0 - 36.0  g/dL   RDW 14.0 11.5 - 15.5 %   Platelets 367 150 - 400 K/uL   nRBC 0.0 0.0 - 0.2 %   Neutrophils Relative % 60 %   Neutro Abs 4.0 1.7 - 7.7 K/uL   Lymphocytes Relative 26 %   Lymphs Abs 1.7 0.7 - 4.0 K/uL   Monocytes Relative 13 %   Monocytes Absolute 0.8 0.1 - 1.0 K/uL   Eosinophils Relative 1 %   Eosinophils Absolute 0.1 0.0 - 0.5 K/uL   Basophils Relative 0 %   Basophils Absolute 0.0 0.0 - 0.1 K/uL   Immature Granulocytes 0 %   Abs Immature Granulocytes 0.02 0.00 - 0.07 K/uL  Lactic acid, plasma  Result Value Ref Range   Lactic Acid, Venous 1.0 0.5 - 1.9 mmol/L  Comprehensive metabolic panel  Result Value Ref Range   Sodium 136 135 - 145 mmol/L  Potassium 3.6 3.5 - 5.1 mmol/L   Chloride 98 98 - 111 mmol/L   CO2 28 22 - 32 mmol/L   Glucose, Bld 105 (H) 70 - 99 mg/dL   BUN 22 8 - 23 mg/dL   Creatinine, Ser 1.00 0.61 - 1.24 mg/dL   Calcium 7.7 (L) 8.9 - 10.3 mg/dL   Total Protein 5.4 (L) 6.5 - 8.1 g/dL   Albumin 2.5 (L) 3.5 - 5.0 g/dL   AST 32 15 - 41 U/L   ALT 31 0 - 44 U/L   Alkaline Phosphatase 100 38 - 126 U/L   Total Bilirubin 1.6 (H) 0.3 - 1.2 mg/dL   GFR, Estimated >60 >60 mL/min   Anion gap 10 5 - 15  Magnesium  Result Value Ref Range   Magnesium 1.8 1.7 - 2.4 mg/dL  CBC with Differential/Platelet  Result Value Ref Range   WBC 5.6 4.0 - 10.5 K/uL   RBC 3.90 (L) 4.22 - 5.81 MIL/uL   Hemoglobin 12.3 (L) 13.0 - 17.0 g/dL   HCT 37.3 (L) 39.0 - 52.0 %   MCV 95.6 80.0 - 100.0 fL   MCH 31.5 26.0 - 34.0 pg   MCHC 33.0 30.0 - 36.0 g/dL   RDW 14.0 11.5 - 15.5 %   Platelets 317 150 - 400 K/uL   nRBC 0.0 0.0 - 0.2 %   Neutrophils Relative % 60 %   Neutro Abs 3.4 1.7 - 7.7 K/uL   Lymphocytes Relative 25 %   Lymphs Abs 1.4 0.7 - 4.0 K/uL   Monocytes Relative 12 %   Monocytes Absolute 0.7 0.1 - 1.0 K/uL   Eosinophils Relative 1 %   Eosinophils Absolute 0.1 0.0 - 0.5 K/uL   Basophils Relative 1 %   Basophils Absolute 0.0 0.0 - 0.1 K/uL   Immature Granulocytes 1  %   Abs Immature Granulocytes 0.03 0.00 - 0.07 K/uL  Glucose, capillary  Result Value Ref Range   Glucose-Capillary 116 (H) 70 - 99 mg/dL  Glucose, capillary  Result Value Ref Range   Glucose-Capillary 114 (H) 70 - 99 mg/dL  Glucose, capillary  Result Value Ref Range   Glucose-Capillary 105 (H) 70 - 99 mg/dL  Glucose, capillary  Result Value Ref Range   Glucose-Capillary 102 (H) 70 - 99 mg/dL  Glucose, capillary  Result Value Ref Range   Glucose-Capillary 101 (H) 70 - 99 mg/dL     Murvin Natal, MD [facility]   03/26/2022  1:42 PM How to contact the [facility] Attending or Consulting provider [place] or covering provider during after hours 7P -7A, for this patient?  Check the care team in [facility] and look for a) attending/consulting TRH provider listed and b) the [facility] team listed Log into [email or web address] and use [facility]'s universal password to access. If you do not have the password, please contact the hospital operator. Locate the [facility] provider you are looking for under [facility] and page to a number that you can be directly reached. If you still have difficulty reaching the provider, please page the [facility] (Director on Call) for the Hospitalists listed on amion for assistance.

## 2022-03-27 NOTE — TOC Initial Note (Signed)
Transition of Care [facility]) - Initial/Assessment Note    Patient Details  Name: Alexander Banks MRN: [id number] Date of Birth: 28-Feb-1956  Transition of Care [facility]) CM/SW Contact:    Salome Arnt, [place] Phone Number: 03/27/2022, 7:53 AM  Clinical Narrative: Pt admitted with abdominal pain. Assessment completed due to high risk readmission score. Pt just assessed 2 weeks ago by [facility] for same. Pt's wife indicates pt works full-time in [employer] from home. He is independent with ADLs and ambulates with a cane. Pt has transportation to appointments. He was diagnosed with pancreatic cancer in June 2022 and is followed by [name]. [facility] palliative care is following patient. TOC will continue to follow.                      Expected Discharge Plan: Home/Self Care Barriers to Discharge: Continued Medical Work up   Patient Goals and CMS Choice Patient states their goals for this hospitalization and ongoing recovery are:: return home      Expected Discharge Plan and Services Expected Discharge Plan: Home/Self Care In-house Referral: Clinical Social Work     Living arrangements for the past 2 months: Single Family Home                                      Prior Living Arrangements/Services Living arrangements for the past 2 months: Single Family Home Lives with:: Spouse Patient language and need for interpreter reviewed:: Yes Do you feel safe going back to the place where you live?: Yes      Need for Family Participation in Patient Care: No (Comment) Care giver support system in place?: Yes (comment) Current home services: DME (cane) Criminal Activity/Legal Involvement Pertinent to Current Situation/Hospitalization: No - Comment as needed  Activities of Daily Living Home Assistive Devices/Equipment: Eyeglasses, Hearing aid, Cane (specify quad or straight), Shower chair with back ADL Screening (condition at time of admission) Patient's cognitive ability  adequate to safely complete daily activities?: Yes Is the patient deaf or have difficulty hearing?: Yes Does the patient have difficulty seeing, even when wearing glasses/contacts?: No Does the patient have difficulty concentrating, remembering, or making decisions?: No Patient able to express need for assistance with ADLs?: Yes Does the patient have difficulty dressing or bathing?: Yes Independently performs ADLs?: No Communication: Independent Dressing (OT): Needs assistance Is this a change from baseline?: Change from baseline, expected to last <3days Grooming: Independent Feeding: Independent Bathing: Needs assistance Is this a change from baseline?: Change from baseline, expected to last <3 days Toileting: Needs assistance Is this a change from baseline?: Change from baseline, expected to last <3 days In/Out Bed: Needs assistance Is this a change from baseline?: Change from baseline, expected to last <3 days Walks in Home: Independent Does the patient have difficulty walking or climbing stairs?: Yes Weakness of Legs: Both Weakness of Arms/Hands: Both  Permission Sought/Granted                  Emotional Assessment       Orientation: : Oriented to Self, Oriented to Place, Oriented to  Time, Oriented to Situation Alcohol / Substance Use: Not Applicable Psych Involvement: No (comment)  Admission diagnosis:  Abdominal pain [R10.9] Abdominal pain, unspecified abdominal location [R10.9] Patient Active Problem List   Diagnosis Date Noted   Hypokalemia 03/27/2022   Tobacco use disorder 03/27/2022   Abdominal pain 03/26/2022  DVT (deep venous thrombosis) ([place]) 03/04/2022   Hypotension 12/11/2021   Abnormal weight loss 12/11/2021   Severe protein-calorie malnutrition 12/11/2021   Acquired thrombophilia ([place]) 12/11/2021   Immunocompromised ([place]) 12/11/2021   Exocrine pancreatic insufficiency 10/10/2021   Diabetes mellitus secondary to pancreatic insufficiency ([place])  10/10/2021   Genetic testing 04/30/2021   History of cholangitis 01/16/2021   Current smoker 01/02/2021   Port-A-Cath in place 12/26/2020   Bile duct obstruction 12/21/2020   Pancreatic adenocarcinoma ([place]) 12/04/2020   Erectile dysfunction 11/17/2020   Mixed hyperlipidemia 11/17/2020   Type 2 diabetes mellitus with stage 3a chronic kidney disease, with long-term current use of insulin ([place]) 11/13/2020   Hyponatremia 11/07/2020   Macrocytic anemia 11/07/2020   AKI (acute kidney injury) ([place]) 11/07/2020   Hypothyroidism 09/27/2014   Obstructive sleep apnea 12/07/2012   Osteoarthritis 12/07/2012   Essential hypertension, benign 10/19/2012   PCP:  [name], MD Pharmacy:   [facility], [place] [address] [place] [address] Phone: [phone] Fax: [phone]  [place], [place] [address] Phone: [phone] Fax: [phone]  [facility] [phone] - [place], [place] AT [place] [address] [place] [address] Phone: [phone] Fax: [phone]     Social Determinants of Health ([place]) Interventions    Readmission Risk Interventions    03/27/2022    7:51 AM 03/05/2022    9:06 AM  Readmission Risk Prevention Plan  Transportation Screening Complete Complete  HRI or Home Care Consult Complete Complete  Social Work Consult for [place] Planning/Counseling Complete Complete  Palliative Care Screening  Not Applicable  Medication Review [employer]) Complete Complete

## 2022-03-27 NOTE — H&P (View-Only) (Signed)
Referring Provider: No ref. provider found Primary Care Physician:  [name], MD Primary Gastroenterologist:  Previously Dr. [name]   Date of Admission: 03/26/22 Date of Consultation: 03/27/22  Reason for Consultation:  concern for gastric outlet obstruction.  HPI:  Alexander Banks is a 66 y.o. year old male with medical history of IDA, DM type 2, HLD, HTN, pancreatic adenocarcinoma s/p partial whipple and chemotherapy who has undergone external biliary drainage placement as well as biliary stenting who presented to the ED yesterday with c/o abdominal and back pain x several weeks. Pain becoming worse, PO Intake of food difficult. Had one episode of vomiting. Last solid food was 3 days ago.   ED course:  Sodium 133, potassium 3.1, BUN 27, calcium 8.1, T bili 1.7, hgb 12.7   CT A/P with contrast with distended stomach with narrowed/angulated appearance of the gastrojejunostomy, significance uncertain but relative outlet obstruction along the gastrojejunostomy cannot be excluded. Indistinct hypodense soft tissue in the region of the pancreatic head and abutting the right margin of the SMA, compatible tumor. bandlike cords of soft tissue density below the pancreatic body which could be due to local tumor extension, inflammation, or less likely infection. Percutaneous biliary drain in place with loop formed in the right bile duct but near the confluence of the biliary tree. Adjacent stent in the common hepatic duct. Moderate intrahepatic biliary dilatation, although improved from 01/29/2022. Mild perihepatic ascites.Narrowing of the proximal celiac trunk and proximal SMA due to median arcuate ligament and atheromatous vascular disease.  General surgery consulted who requested GI evaluation for possible GOO  Consult: Notably, Complex case of pancreatic adenocarcinoma s/p partial whipple procedure and chemotherapy who required biliary stenting due to secondary stricture, most recently with  biliary drain placed, most recently attempted enteroscopy to try and evaluate pancreaticojejunostomy, however, procedure was unsuccessful and patient DBE at [facility] was suggested by Dr. Rush Landmark, however deferred to patient oncologist and surgical oncologist for further recommendations on how to proceed and was decided that patient is not a surgical candidate at this time, surgical oncology recommended further chemo/clinical trials and potential radiation therapy.   Patient reports that he has been having abdominal and back pain for the past few weeks. Reports appetite has been decreased for some time. About 1 week ago pain was so severe and he really did not want to eat then became where he was unable to a few days ago. Had some mashed potatoes and vomited them back up. Denies constipation or diarrhea.  He was able to tolerate liquids though not food.  He reports that abdominal pain has resolved with placement of NG tube though he is uncomfortable due to presence of this.   Past Medical History:  Diagnosis Date   Anemia, iron deficiency    Diabetes mellitus without complication ([name])    on meds   Hearing loss    Bil hearing aids   Hyperlipidemia    Hypertension    Port-A-Cath in place 12/26/2020   Prediabetes    Sleep apnea     Past Surgical History:  Procedure Laterality Date   BILIARY STENT PLACEMENT N/A 11/09/2020   Procedure: BILIARY STENT PLACEMENT - 31 F X 5 CM STENT;  Surgeon: Rogene Houston, MD;  Location: [facility];  Service: Endoscopy;  Laterality: N/A;   BILIARY STENT PLACEMENT N/A 12/27/2020   Procedure: BILIARY STENT PLACEMENT;  Surgeon: Rogene Houston, MD;  Location: [facility];  Service: Endoscopy;  Laterality: N/A;   COLONOSCOPY  ENTEROSCOPY N/A 12/27/2021   Procedure: ENTEROSCOPY;  Surgeon: Mansouraty, Telford Nab., MD;  Location: [place];  Service: Gastroenterology;  Laterality: N/A;   ERCP N/A 11/09/2020   Procedure: ENDOSCOPIC RETROGRADE CHOLANGIOPANCREATOGRAPHY  (ERCP);  Surgeon: Rogene Houston, MD;  Location: [facility];  Service: Endoscopy;  Laterality: N/A;   ERCP N/A 12/27/2020   Procedure: ENDOSCOPIC RETROGRADE CHOLANGIOPANCREATOGRAPHY (ERCP);  Surgeon: Rogene Houston, MD;  Location: [facility];  Service: Endoscopy;  Laterality: N/A;   ESOPHAGOGASTRODUODENOSCOPY (EGD) WITH PROPOFOL N/A 11/30/2020   Procedure: ESOPHAGOGASTRODUODENOSCOPY (EGD) WITH PROPOFOL;  Surgeon: Milus Banister, MD;  Location: [facility];  Service: Endoscopy;  Laterality: N/A;   EUS N/A 11/30/2020   Procedure: UPPER ENDOSCOPIC ULTRASOUND (EUS) RADIAL;  Surgeon: Milus Banister, MD;  Location: [facility];  Service: Endoscopy;  Laterality: N/A;   FINE NEEDLE ASPIRATION N/A 11/30/2020   Procedure: FINE NEEDLE ASPIRATION (FNA) LINEAR;  Surgeon: Milus Banister, MD;  Location: [facility];  Service: Endoscopy;  Laterality: N/A;   GIVENS CAPSULE STUDY     IR BILIARY DRAIN PLACEMENT WITH CHOLANGIOGRAM  02/04/2022   IR BILIARY STENT(S) EXISTING ACCESS INC DILATION CATH EXCHANGE  03/15/2022   IR IMAGING GUIDED PORT INSERTION  12/18/2020   IR PATIENT EVAL TECH 0-60 MINS  03/08/2022   KNEE SURGERY     4 surgeries on left, 2 on right knee   SHOULDER SURGERY     right shoulder   SPHINCTEROTOMY N/A 11/09/2020   Procedure: SHORT BILIARY SPHINCTEROTOMY;  Surgeon: Rogene Houston, MD;  Location: [facility];  Service: Endoscopy;  Laterality: N/A;   WHIPPLE PROCEDURE      Prior to Admission medications   Medication Sig Start Date End Date Taking? Authorizing Provider  apixaban (ELIQUIS) 5 MG TABS tablet Take 1 tablet (5 mg total) by mouth 2 (two) times daily. 12/19/21  Yes Luking, Elayne Snare, MD  azelastine (ASTELIN) 0.1 % nasal spray Place 2 sprays into both nostrils 2 (two) times daily. Patient taking differently: Place 2 sprays into both nostrils 2 (two) times daily as needed for allergies. 11/23/21  Yes Luking, Elayne Snare, MD  feeding supplement (ENSURE ENLIVE / ENSURE PLUS) LIQD Take 237 mLs by  mouth 2 (two) times daily between meals. 03/08/22  Yes Emokpae, Courage, MD  insulin detemir (LEVEMIR) 100 UNIT/ML FlexPen Inject 6 Units into the skin at bedtime. 02/13/22  Yes Nida, Marella Chimes, MD  insulin lispro (HUMALOG KWIKPEN) 100 UNIT/ML KwikPen Inject 4-7 Units into the skin 3 (three) times daily before meals. 02/13/22  Yes Cassandria Anger, MD  levothyroxine (SYNTHROID) 175 MCG tablet TAKE 1 TABLET DAILY Patient taking differently: Take 175 mcg by mouth daily before breakfast. 10/17/21  Yes Luking, Elayne Snare, MD  lipase/protease/amylase (CREON) 36000 UNITS CPEP capsule Take 2 capsules (72,000 Units total) by mouth 3 (three) times daily with meals. May also take 1 capsule (36,000 Units total) as needed (with snacks). 03/07/22  Yes Emokpae, Courage, MD  OVER THE COUNTER MEDICATION Magnesium   Yes [provider]  oxyCODONE (OXY IR/ROXICODONE) 5 MG immediate release tablet 1 to 1 and 1/2 q4 prn severe pain 03/13/22  Yes Luking, Scott A, MD  sennosides-docusate sodium (SENOKOT-S) 8.6-50 MG tablet Take 1 tablet by mouth daily.   Yes [provider]  sildenafil (VIAGRA) 100 MG tablet TAKE 1/2 TO 1 TABLET BY MOUTH AS NEEDED FOR ERECTILE DYSFUNCTION Patient taking differently: Take 50-100 mg by mouth as needed for erectile dysfunction. 12/03/21  Yes Luking, Elayne Snare,  MD  sodium bicarbonate 650 MG tablet Take 1 tablet (650 mg total) by mouth 2 (two) times daily. 03/07/22  Yes Emokpae, Courage, MD  Continuous Blood Gluc Sensor (FREESTYLE LIBRE 3 SENSOR) MISC 1 Piece by Does not apply route every 14 (fourteen) days. Place 1 sensor on the skin every 14 days. Use to check glucose continuously Dx E11.65 10/22/21   Cassandria Anger, MD  lactulose (CHRONULAC) 10 GM/15ML solution Take 30 mLs (20 g total) by mouth daily. Patient not taking: Reported on 03/26/2022 03/07/22   Roxan Hockey, MD  tamsulosin (FLOMAX) 0.4 MG CAPS capsule Take 1 capsule (0.4 mg total) by mouth daily after  supper. Patient not taking: Reported on 03/26/2022 03/07/22   Roxan Hockey, MD    Current Facility-Administered Medications  Medication Dose Route Frequency Provider Last Rate Last Admin   0.9 % NaCl with KCl 40 mEq / L  infusion   Intravenous Continuous Zierle-Ghosh, Asia B, DO 100 mL/hr at 03/27/22 1007 New Bag at 03/27/22 1007   acetaminophen (TYLENOL) tablet 650 mg  650 mg Oral Q6H PRN Zierle-Ghosh, Asia B, DO       Or   acetaminophen (TYLENOL) suppository 650 mg  650 mg Rectal Q6H PRN Zierle-Ghosh, Asia B, DO       apixaban (ELIQUIS) tablet 5 mg  5 mg Oral BID Zierle-Ghosh, Asia B, DO   5 mg at 03/27/22 0931   Chlorhexidine Gluconate Cloth 2 % PADS 6 each  6 each Topical Daily Zierle-Ghosh, Asia B, DO   6 each at 03/27/22 0936   insulin aspart (novoLOG) injection 0-15 Units  0-15 Units Subcutaneous Q4H Zierle-Ghosh, Asia B, DO       levothyroxine (SYNTHROID) tablet 175 mcg  175 mcg Oral QAC breakfast Zierle-Ghosh, Asia B, DO   175 mcg at 03/27/22 0931   lipase/protease/amylase (CREON) capsule 36,000 Units  36,000 Units Oral TID WC Zierle-Ghosh, Asia B, DO       menthol-cetylpyridinium (CEPACOL) lozenge 3 mg  1 lozenge Oral PRN Aviva Signs, MD       morphine (PF) 2 MG/ML injection 2 mg  2 mg Intravenous Q2H PRN Zierle-Ghosh, Asia B, DO   2 mg at 03/26/22 2045   nicotine (NICODERM CQ - dosed in mg/24 hours) patch 21 mg  21 mg Transdermal Daily Zierle-Ghosh, Asia B, DO       ondansetron (ZOFRAN) tablet 4 mg  4 mg Oral Q6H PRN Zierle-Ghosh, Asia B, DO       Or   ondansetron (ZOFRAN) injection 4 mg  4 mg Intravenous Q6H PRN Zierle-Ghosh, Asia B, DO   4 mg at 03/26/22 2045   oxyCODONE (Oxy IR/ROXICODONE) immediate release tablet 5 mg  5 mg Oral Q4H PRN Zierle-Ghosh, Asia B, DO       sodium bicarbonate tablet 650 mg  650 mg Oral BID Zierle-Ghosh, Asia B, DO   650 mg at 03/27/22 0931    Allergies as of 03/26/2022 - Review Complete 03/26/2022  Allergen Reaction Noted   Lisinopril Cough  10/17/2012    Family History  Problem Relation Age of Onset   Heart attack Father    Heart attack Paternal Grandfather    Leukemia Sister    Heart attack Brother     Social History   Socioeconomic History   Marital status: Married    Spouse name: Not on file   Number of children: Not on file   Years of education: Not on file   Highest education level: Not  on file  Occupational History   Not on file  Tobacco Use   Smoking status: Every Day    Types: Cigars   Smokeless tobacco: Never   Tobacco comments:    Smoke 3-4 cigars a day  Vaping Use   Vaping Use: Never used  Substance and Sexual Activity   Alcohol use: Yes    Comment: occasional   Drug use: Never   Sexual activity: Not on file  Other Topics Concern   Not on file  Social History Narrative   Not on file   Social Determinants of Health   Financial Resource Strain: Low Risk  (12/20/2020)   Overall Financial Resource Strain (CARDIA)    Difficulty of Paying Living Expenses: Not hard at all  Food Insecurity: No Food Insecurity (03/27/2022)   Hunger Vital Sign    Worried About Running Out of Food in the Last Year: Never true    Ran Out of Food in the Last Year: Never true  Transportation Needs: No Transportation Needs (03/27/2022)   PRAPARE - Hydrologist (Medical): No    Lack of Transportation (Non-Medical): No  Physical Activity: Sufficiently Active (12/20/2020)   Exercise Vital Sign    Days of Exercise per Week: 5 days    Minutes of Exercise per Session: 30 min  Recent Concern: Physical Activity - Inactive (11/17/2020)   Exercise Vital Sign    Days of Exercise per Week: 0 days    Minutes of Exercise per Session: 0 min  Stress: Not on file  Social Connections: Not on file  Intimate Partner Violence: Not At Risk (03/27/2022)   Humiliation, Afraid, Rape, and Kick questionnaire    Fear of Current or Ex-Partner: No    Emotionally Abused: No    Physically Abused: No    Sexually Abused:  No    Review of Systems: Gen: Denies fever, chills, loss of appetite, change in weight or weight loss CV: Denies chest pain, heart palpitations, syncope, edema  Resp: Denies shortness of breath with rest, cough, wheezing GI: +abdominal pain radiating to back, +decreased PO intake, +n/v GU : Denies urinary burning, urinary frequency, urinary incontinence.  MS: Denies joint pain,swelling, cramping Derm: Denies rash, itching, dry skin Psych: Denies depression, anxiety,confusion, or memory loss Heme: Denies bruising, bleeding, and enlarged lymph nodes.  Physical Exam: Vital signs in last 24 hours: Temp:  [98 F (36.7 C)-98.3 F (36.8 C)] 98.2 F (36.8 C) (11/08 0438) Pulse Rate:  [82-95] 90 (11/08 0438) Resp:  [10-21] 18 (11/08 0438) BP: (82-139)/(58-84) 127/83 (11/08 0438) SpO2:  [91 %-98 %] 97 % (11/08 0438) FiO2 (%):  [21 %] 21 % (11/07 2039) Weight:  [68 kg-69.2 kg] 69.2 kg (11/07 2118) Last BM Date : 03/26/22 General:   Alert,  Well-developed, well-nourished, pleasant and cooperative in NAD Head:  Normocephalic and atraumatic. Eyes:  Sclera clear, no icterus.   Conjunctiva pink. Ears:  Normal auditory acuity. Nose:  No deformity, discharge,  or lesions. Mouth:  No deformity or lesions, dentition normal. Lungs:  Clear throughout to auscultation.   No wheezes, crackles, or rhonchi. No acute distress. Heart:  Regular rate and rhythm; no murmurs, clicks, rubs,  or gallops. Abdomen:  Soft, nontender and nondistended. No masses, hepatosplenomegaly or hernias noted. Normal bowel sounds, without guarding, and without rebound.   Rectal:  Deferred until time of colonoscopy.   Msk:  Symmetrical without gross deformities. Normal posture. Pulses:  Normal pulses noted. Extremities:  Without clubbing or edema.  Neurologic:  Alert and  oriented x4;  grossly normal neurologically. Skin:  Intact without significant lesions or rashes. Psych:  Alert and cooperative. Normal mood and  affect.  Intake/Output from previous day: 11/07 0701 - 11/08 0700 In: 2000 [IV Piggyback:2000] Out: 2 [Urine:2] Intake/Output this shift: No intake/output data recorded.  Lab Results: Recent Labs    03/26/22 1453 03/27/22 0548  WBC 6.7 5.6  HGB 12.7* 12.3*  HCT 37.9* 37.3*  PLT 367 317   BMET Recent Labs    03/26/22 1453 03/27/22 0548  NA 133* 136  K 3.1* 3.6  CL 91* 98  CO2 31 28  GLUCOSE 115* 105*  BUN 27* 22  CREATININE 1.14 1.00  CALCIUM 8.1* 7.7*   LFT Recent Labs    03/26/22 1453 03/27/22 0548  PROT 6.4* 5.4*  ALBUMIN 3.1* 2.5*  AST 37 32  ALT 34 31  ALKPHOS 121 100  BILITOT 1.7* 1.6*    Studies/Results: DG Chest Portable 1 View  Result Date: 03/26/2022 CLINICAL DATA:  NG tube placement. EXAM: PORTABLE CHEST 1 VIEW COMPARISON:  03/04/2022. FINDINGS: The heart size and mediastinal contours are stable. There is atherosclerotic calcification of the aorta. The visualized lungs are clear. No effusion is seen. A stable right chest port terminates over the superior vena cava. An enteric tube courses over the stomach and out of the field of view. IMPRESSION: 1. No active disease. 2. An enteric tube courses over the stomach and out of the field of view. Electronically Signed   By: Brett Fairy M.D.   On: 03/26/2022 20:06   CT ABDOMEN PELVIS W CONTRAST  Result Date: 03/26/2022 CLINICAL DATA:  Pancreatic cancer, biliary stent. Nausea, vomiting, and abdominal pain. Weight loss. * Tracking Code: BO * EXAM: CT ABDOMEN AND PELVIS WITH CONTRAST TECHNIQUE: Multidetector CT imaging of the abdomen and pelvis was performed using the standard protocol following bolus administration of intravenous contrast. RADIATION DOSE REDUCTION: This exam was performed according to the departmental dose-optimization program which includes automated exposure control, adjustment of the mA and/or kV according to patient size and/or use of iterative reconstruction technique. CONTRAST:  184m  OMNIPAQUE IOHEXOL 300 MG/ML  SOLN COMPARISON:  PET-CT 02/21/2022 and CT abdomen 01/29/2022 FINDINGS: Lower chest: Unremarkable Hepatobiliary: Percutaneous biliary drain in place with loop formed in the right bile duct but near the confluence of the biliary tree. This is also close to the proximal portion of a biliary stent. There is still moderate intrahepatic biliary dilatation, although improved from 01/29/2022. I do not see a well-defined liver lesion corresponding with the focus of accentuated metabolic activity shown on prior PET-CT. Highly indistinct porta hepatis regions with ill definition of tissue planes. Mild perihepatic ascites. Pancreas: Dilated dorsal pancreatic duct extending into an indistinctly marginated pancreatic head lesion presumably reflecting the underlying tumor, with ill-defined hypodensity in this region measuring about 3.5 by 1.9 cm on image 24 of series 2, and abutting the margin of the SMA although not completely wrapping around the SMA. The prior lesser sac fluid collection shown on 01/29/2022 is no longer present but there are bandlike cords of soft tissue density below the pancreatic body as on image 39 of series 5 which could be due to local tumor extension, inflammation, or less likely infection. Spleen: Unremarkable Adrenals/Urinary Tract: Stable small renal cyst do not require individual follow up. Urinary bladder unremarkable. No hydronephrosis. Adrenal glands unremarkable. Stomach/Bowel: Notably distended stomach. Narrowed gastrojejunostomy on images 32 through 37 of series 2, significance uncertain but  relative outlet obstruction along the gastrojejunostomy cannot be excluded. Ill definition of the small bowel receiving pancreatic and biliary drainage. Borderline prominence of stool in the colon. A loop of bowel containing an air-fluid level extends anterior to the left hepatic lobe on image 21 series 2, this is more cephalad than prior extension of bowel. Vascular/Lymphatic:  Atherosclerosis is present, including aortoiliac atherosclerotic disease. Narrowing of the proximal celiac trunk possibly due to median arcuate ligament and atheromatous vascular disease. There is also mild atherosclerotic narrowing of the proximal SMA. As noted above, indistinct hypodense soft tissue abuts the right margin of the SMA on image 24 series 2 may represent tumor. Collateral vessels extend around the stomach. Reproductive: Mild prostatomegaly. Other: There is some mild hazy stranding in central mesentery more notable indistinct stranding along the porta hepatis. Trace perihepatic ascites. Musculoskeletal: Bridging spurring of both sacroiliac joints. Lower lumbar degenerative facet arthropathy. Lumbar impingement at L4-5 due to spondylosis and degenerative disc disease. IMPRESSION: 1. Notably distended stomach with narrowed/angulated appearance of the gastrojejunostomy, significance uncertain but relative outlet obstruction along the gastrojejunostomy cannot be excluded. 2. Ill definition of the porta hepatis regions with ill definition of tissue planes. Indistinct hypodense soft tissue in the region of the pancreatic head and abutting the right margin of the SMA, compatible tumor. 3. Prior lesser sac fluid collection is no longer present but there are bandlike cords of soft tissue density below the pancreatic body which could be due to local tumor extension, inflammation, or less likely infection. 4. Percutaneous biliary drain in place with loop formed in the right bile duct but near the confluence of the biliary tree. Adjacent stent in the common hepatic duct. There is still moderate intrahepatic biliary dilatation, although improved from 01/29/2022. 5. Mild perihepatic ascites. 6. Narrowing of the proximal celiac trunk and proximal SMA due to median arcuate ligament and atheromatous vascular disease. 7. Lumbar impingement at L4-5 due to spondylosis and degenerative disc disease. 8. Mild prostatomegaly.  9. Borderline prominence of stool in the colon. 10. Aortic atherosclerosis. Aortic Atherosclerosis (ICD10-I70.0). Electronically Signed   By: Van Clines M.D.   On: 03/26/2022 17:22    Impression: Alexander Banks is a 66 y.o. year old male with medical history of IDA, DM type 2, HLD, HTN, pancreatic adenocarcinoma s/p partial whipple and chemotherapy who has undergone external biliary drainage placement as well as biliary stenting who presented to the ED yesterday with c/o abdominal and back pain x several weeks. PO Intake of food became intolerable. one episode of vomiting. General surgery consulted initially due to abnormal CT findings, however, they requested GI evaluation for possible GOO.   concern for gastric outlet obstruction secondary to known pancreatic adenocarcinoma: Notably history of Complex case of pancreatic adenocarcinoma s/p partial whipple procedure and chemotherapy who has required biliary stenting due to secondary stricture, most recently with stenting and angioplasty of stricture at the choledocho jejunostomy, with biliary drain exchange for externalized pigtail drain via IR at the end of October.recommendations via surg onc to pursue second line chemo/possible clinical trials/radiation therapy given continued rise in CA 19-9, worrisome for recurrence, deemed not a surgical candidate.  Patient c/o abdominal and back pain for the past few weeks though worse over the past few days with PO intake of solids becoming intolerable. He had one episode of vomiting. CT A/P on arrival to the ED concerning for gastric outlet obstruction, generally surgery consulted but recommended GI see the patient for further evaluation. NG tube was placed and  patient has had resolution of his abdominal pain though is uncomfortable due to tube placement. Unfortunately he did received a dose of eliquis this morning which will need to be on hold moving forward. Recommend proceeding with EGD for potential  treatment of GOO, now potentially on Friday given presence of recent ACs. Indications, risks and benefits of procedure discussed in detail with patient. Patient verbalized understanding and is in agreement to proceed with EGD at this time.   Notably patients LFTs remain WNL other than consistently elevated T bili of 1.6. no presence of fevers or chills. He has upcoming biliary drain exchange with IR on 11/14    Plan: NG tube to remain in place  [facility] for EGD Friday Continue supportive measures  Continue creon    LOS: 1 day    03/27/2022, 10:51 AM  Kaidan Spengler L. Alver Sorrow, MSN, APRN, AGNP-C Adult-Gerontology Nurse Practitioner [facility]

## 2022-03-27 NOTE — Assessment & Plan Note (Addendum)
-   Patient takes 6 units of basal insulin at baseline, reduced to 3 units - prandial coverage reduced to 2 units  - continue frequent CBG monitoring recommended CBG (last 3)  Recent Labs    03/29/22 0544 03/29/22 0719 03/29/22 1618  GLUCAP 139* 148* 100*

## 2022-03-27 NOTE — Assessment & Plan Note (Addendum)
Continue Synthroid °

## 2022-03-27 NOTE — Progress Notes (Signed)
Initial Nutrition Assessment  DOCUMENTATION CODES:      INTERVENTION:  When diet advances add ONS  NUTRITION DIAGNOSIS:   Severe Malnutrition related to cancer and cancer related treatments as evidenced by energy intake < or equal to 75% for > or equal to 1 month, severe fat depletion, severe muscle depletion. - ongoing  GOAL:  Patient will meet greater than or equal to 90% of their needs  MONITOR:  Diet advancement, Labs, I & O's, Weight trends, Skin (if feasible given his cancer diagnosis)  REASON FOR ASSESSMENT:   Malnutrition Screening Tool    ASSESSMENT: Patient is a 66 yo male with hx of DM2, HTN, pancreatic adenocarcinoma s/p partial whipple and chemotherapy. He is s/p both biliary drain placement as well as biliary stenting. Patient presents with back and abdominal pain. Concern for gastric outlet obstruction per NP. EGD planned for Friday.   Patient able to tolerate liquids but no solid food per chart. Nursing present helping patient transfer to chair. He is chewing gum. NGT (16 fr) with bridle in place. OP noted ~ 1000 ml. NPO at this time. Patient identified as malnourished during admission 03/06/22. Poor oral intake reported at that time as well.   Weights show loss of 6% (4.4 kg) since late September.   Medications: insulin, creon, nicoderm.  IVF-NS@ 100 ml/hr     Latest Ref Rng & Units 03/27/2022    5:48 AM 03/26/2022    2:53 PM 03/13/2022    3:41 PM  BMP  Glucose 70 - 99 mg/dL 105  115  143   BUN 8 - 23 mg/dL 22  27  34   Creatinine 0.61 - 1.24 mg/dL 1.00  1.14  1.24   BUN/Creat Ratio 10 - 24   27   Sodium 135 - 145 mmol/L 136  133  133   Potassium 3.5 - 5.1 mmol/L 3.6  3.1  5.4   Chloride 98 - 111 mmol/L 98  91  97   CO2 22 - 32 mmol/L '28  31  23   '$ Calcium 8.9 - 10.3 mg/dL 7.7  8.1  9.0       NUTRITION - FOCUSED PHYSICAL EXAM: Nutrition-Focused physical exam completed. Findings are severe orbital, buccal fat depletion, severe clavicle, scapular muscle  depletion, and no edema.     Diet Order:   Diet Order             Diet NPO time specified Except for: Ice Chips, Sips with Meds  Diet effective now                   EDUCATION NEEDS:  No education needs have been identified at this time  Skin:  Skin Assessment: Skin Integrity Issues: Skin Integrity Issues:: Stage I Stage I: sacrum  Last BM:  11/7  Height:   Ht Readings from Last 1 Encounters:  03/26/22 '6\' 2"'$  (1.88 m)    Weight:   Wt Readings from Last 1 Encounters:  03/26/22 69.2 kg    Ideal Body Weight:   86 kg   BMI:  Body mass index is 19.59 kg/m.  Estimated Nutritional Needs:   Kcal:  7672-0947  Protein:  97-104 gr  Fluid:  2 liters daily  Colman Cater MS,RD,CSG,LDN Contact: Shea Evans

## 2022-03-27 NOTE — Assessment & Plan Note (Signed)
-   this has been repleted

## 2022-03-27 NOTE — Assessment & Plan Note (Signed)
-   Patient is a current smoker. - Nicotine patch ordered - Encouraged regarding cessation

## 2022-03-27 NOTE — Progress Notes (Signed)
Pt has been alert and oriented x4 this shift. Pt has been ambulated in the hall and room multiple times today with spouse. Pt has put out a total of 1525m out of NG tube and has had one small BM. Pt has remained NPO with icechips.Pt has denied any pain  this shift.

## 2022-03-27 NOTE — Consult Note (Signed)
Referring Provider: No ref. provider found Primary Care Physician:  [name], MD Primary Gastroenterologist:  Previously Dr. [name]   Date of Admission: 03/26/22 Date of Consultation: 03/27/22  Reason for Consultation:  concern for gastric outlet obstruction.  HPI:  Alexander Banks is a 66 y.o. year old male with medical history of IDA, DM type 2, HLD, HTN, pancreatic adenocarcinoma s/p partial whipple and chemotherapy who has undergone external biliary drainage placement as well as biliary stenting who presented to the ED yesterday with c/o abdominal and back pain x several weeks. Pain becoming worse, PO Intake of food difficult. Had one episode of vomiting. Last solid food was 3 days ago.   ED course:  Sodium 133, potassium 3.1, BUN 27, calcium 8.1, T bili 1.7, hgb 12.7   CT A/P with contrast with distended stomach with narrowed/angulated appearance of the gastrojejunostomy, significance uncertain but relative outlet obstruction along the gastrojejunostomy cannot be excluded. Indistinct hypodense soft tissue in the region of the pancreatic head and abutting the right margin of the SMA, compatible tumor. bandlike cords of soft tissue density below the pancreatic body which could be due to local tumor extension, inflammation, or less likely infection. Percutaneous biliary drain in place with loop formed in the right bile duct but near the confluence of the biliary tree. Adjacent stent in the common hepatic duct. Moderate intrahepatic biliary dilatation, although improved from 01/29/2022. Mild perihepatic ascites.Narrowing of the proximal celiac trunk and proximal SMA due to median arcuate ligament and atheromatous vascular disease.  General surgery consulted who requested GI evaluation for possible GOO  Consult: Notably, Complex case of pancreatic adenocarcinoma s/p partial whipple procedure and chemotherapy who required biliary stenting due to secondary stricture, most recently with  biliary drain placed, most recently attempted enteroscopy to try and evaluate pancreaticojejunostomy, however, procedure was unsuccessful and patient DBE at [facility] was suggested by Dr. Rush Landmark, however deferred to patient oncologist and surgical oncologist for further recommendations on how to proceed and was decided that patient is not a surgical candidate at this time, surgical oncology recommended further chemo/clinical trials and potential radiation therapy.   Patient reports that he has been having abdominal and back pain for the past few weeks. Reports appetite has been decreased for some time. About 1 week ago pain was so severe and he really did not want to eat then became where he was unable to a few days ago. Had some mashed potatoes and vomited them back up. Denies constipation or diarrhea.  He was able to tolerate liquids though not food.  He reports that abdominal pain has resolved with placement of NG tube though he is uncomfortable due to presence of this.   Past Medical History:  Diagnosis Date   Anemia, iron deficiency    Diabetes mellitus without complication ([place])    on meds   Hearing loss    Bil hearing aids   Hyperlipidemia    Hypertension    Port-A-Cath in place 12/26/2020   Prediabetes    Sleep apnea     Past Surgical History:  Procedure Laterality Date   BILIARY STENT PLACEMENT N/A 11/09/2020   Procedure: BILIARY STENT PLACEMENT - 46 F X 5 CM STENT;  Surgeon: Rogene Houston, MD;  Location: [facility];  Service: Endoscopy;  Laterality: N/A;   BILIARY STENT PLACEMENT N/A 12/27/2020   Procedure: BILIARY STENT PLACEMENT;  Surgeon: Rogene Houston, MD;  Location: [facility];  Service: Endoscopy;  Laterality: N/A;   COLONOSCOPY  ENTEROSCOPY N/A 12/27/2021   Procedure: ENTEROSCOPY;  Surgeon: Mansouraty, Telford Nab., MD;  Location: [facility];  Service: Gastroenterology;  Laterality: N/A;   ERCP N/A 11/09/2020   Procedure: ENDOSCOPIC RETROGRADE CHOLANGIOPANCREATOGRAPHY  (ERCP);  Surgeon: Rogene Houston, MD;  Location: [facility];  Service: Endoscopy;  Laterality: N/A;   ERCP N/A 12/27/2020   Procedure: ENDOSCOPIC RETROGRADE CHOLANGIOPANCREATOGRAPHY (ERCP);  Surgeon: Rogene Houston, MD;  Location: [facility];  Service: Endoscopy;  Laterality: N/A;   ESOPHAGOGASTRODUODENOSCOPY (EGD) WITH PROPOFOL N/A 11/30/2020   Procedure: ESOPHAGOGASTRODUODENOSCOPY (EGD) WITH PROPOFOL;  Surgeon: Milus Banister, MD;  Location: [facility];  Service: Endoscopy;  Laterality: N/A;   EUS N/A 11/30/2020   Procedure: UPPER ENDOSCOPIC ULTRASOUND (EUS) RADIAL;  Surgeon: Milus Banister, MD;  Location: [facility];  Service: Endoscopy;  Laterality: N/A;   FINE NEEDLE ASPIRATION N/A 11/30/2020   Procedure: FINE NEEDLE ASPIRATION (FNA) LINEAR;  Surgeon: Milus Banister, MD;  Location: [facility];  Service: Endoscopy;  Laterality: N/A;   GIVENS CAPSULE STUDY     IR BILIARY DRAIN PLACEMENT WITH CHOLANGIOGRAM  02/04/2022   IR BILIARY STENT(S) EXISTING ACCESS INC DILATION CATH EXCHANGE  03/15/2022   IR IMAGING GUIDED PORT INSERTION  12/18/2020   IR PATIENT EVAL TECH 0-60 MINS  03/08/2022   KNEE SURGERY     4 surgeries on left, 2 on right knee   SHOULDER SURGERY     right shoulder   SPHINCTEROTOMY N/A 11/09/2020   Procedure: SHORT BILIARY SPHINCTEROTOMY;  Surgeon: Rogene Houston, MD;  Location: [facility];  Service: Endoscopy;  Laterality: N/A;   WHIPPLE PROCEDURE      Prior to Admission medications   Medication Sig Start Date End Date Taking? Authorizing Provider  apixaban (ELIQUIS) 5 MG TABS tablet Take 1 tablet (5 mg total) by mouth 2 (two) times daily. 12/19/21  Yes Luking, Elayne Snare, MD  azelastine (ASTELIN) 0.1 % nasal spray Place 2 sprays into both nostrils 2 (two) times daily. Patient taking differently: Place 2 sprays into both nostrils 2 (two) times daily as needed for allergies. 11/23/21  Yes Luking, Elayne Snare, MD  feeding supplement (ENSURE ENLIVE / ENSURE PLUS) LIQD Take 237 mLs by  mouth 2 (two) times daily between meals. 03/08/22  Yes Emokpae, Courage, MD  insulin detemir (LEVEMIR) 100 UNIT/ML FlexPen Inject 6 Units into the skin at bedtime. 02/13/22  Yes Nida, Marella Chimes, MD  insulin lispro (HUMALOG KWIKPEN) 100 UNIT/ML KwikPen Inject 4-7 Units into the skin 3 (three) times daily before meals. 02/13/22  Yes Cassandria Anger, MD  levothyroxine (SYNTHROID) 175 MCG tablet TAKE 1 TABLET DAILY Patient taking differently: Take 175 mcg by mouth daily before breakfast. 10/17/21  Yes Luking, Elayne Snare, MD  lipase/protease/amylase (CREON) 36000 UNITS CPEP capsule Take 2 capsules (72,000 Units total) by mouth 3 (three) times daily with meals. May also take 1 capsule (36,000 Units total) as needed (with snacks). 03/07/22  Yes Emokpae, Courage, MD  OVER THE COUNTER MEDICATION Magnesium   Yes [provider]  oxyCODONE (OXY IR/ROXICODONE) 5 MG immediate release tablet 1 to 1 and 1/2 q4 prn severe pain 03/13/22  Yes Luking, Scott A, MD  sennosides-docusate sodium (SENOKOT-S) 8.6-50 MG tablet Take 1 tablet by mouth daily.   Yes [provider]  sildenafil (VIAGRA) 100 MG tablet TAKE 1/2 TO 1 TABLET BY MOUTH AS NEEDED FOR ERECTILE DYSFUNCTION Patient taking differently: Take 50-100 mg by mouth as needed for erectile dysfunction. 12/03/21  Yes Luking, Elayne Snare,  MD  sodium bicarbonate 650 MG tablet Take 1 tablet (650 mg total) by mouth 2 (two) times daily. 03/07/22  Yes Emokpae, Courage, MD  Continuous Blood Gluc Sensor (FREESTYLE LIBRE 3 SENSOR) MISC 1 Piece by Does not apply route every 14 (fourteen) days. Place 1 sensor on the skin every 14 days. Use to check glucose continuously Dx E11.65 10/22/21   Cassandria Anger, MD  lactulose (CHRONULAC) 10 GM/15ML solution Take 30 mLs (20 g total) by mouth daily. Patient not taking: Reported on 03/26/2022 03/07/22   Roxan Hockey, MD  tamsulosin (FLOMAX) 0.4 MG CAPS capsule Take 1 capsule (0.4 mg total) by mouth daily after  supper. Patient not taking: Reported on 03/26/2022 03/07/22   Roxan Hockey, MD    Current Facility-Administered Medications  Medication Dose Route Frequency Provider Last Rate Last Admin   0.9 % NaCl with KCl 40 mEq / L  infusion   Intravenous Continuous Zierle-Ghosh, Asia B, DO 100 mL/hr at 03/27/22 1007 New Bag at 03/27/22 1007   acetaminophen (TYLENOL) tablet 650 mg  650 mg Oral Q6H PRN Zierle-Ghosh, Asia B, DO       Or   acetaminophen (TYLENOL) suppository 650 mg  650 mg Rectal Q6H PRN Zierle-Ghosh, Asia B, DO       apixaban (ELIQUIS) tablet 5 mg  5 mg Oral BID Zierle-Ghosh, Asia B, DO   5 mg at 03/27/22 0931   Chlorhexidine Gluconate Cloth 2 % PADS 6 each  6 each Topical Daily Zierle-Ghosh, Asia B, DO   6 each at 03/27/22 0936   insulin aspart (novoLOG) injection 0-15 Units  0-15 Units Subcutaneous Q4H Zierle-Ghosh, Asia B, DO       levothyroxine (SYNTHROID) tablet 175 mcg  175 mcg Oral QAC breakfast Zierle-Ghosh, Asia B, DO   175 mcg at 03/27/22 0931   lipase/protease/amylase (CREON) capsule 36,000 Units  36,000 Units Oral TID WC Zierle-Ghosh, Asia B, DO       menthol-cetylpyridinium (CEPACOL) lozenge 3 mg  1 lozenge Oral PRN Aviva Signs, MD       morphine (PF) 2 MG/ML injection 2 mg  2 mg Intravenous Q2H PRN Zierle-Ghosh, Asia B, DO   2 mg at 03/26/22 2045   nicotine (NICODERM CQ - dosed in mg/24 hours) patch 21 mg  21 mg Transdermal Daily Zierle-Ghosh, Asia B, DO       ondansetron (ZOFRAN) tablet 4 mg  4 mg Oral Q6H PRN Zierle-Ghosh, Asia B, DO       Or   ondansetron (ZOFRAN) injection 4 mg  4 mg Intravenous Q6H PRN Zierle-Ghosh, Asia B, DO   4 mg at 03/26/22 2045   oxyCODONE (Oxy IR/ROXICODONE) immediate release tablet 5 mg  5 mg Oral Q4H PRN Zierle-Ghosh, Asia B, DO       sodium bicarbonate tablet 650 mg  650 mg Oral BID Zierle-Ghosh, Asia B, DO   650 mg at 03/27/22 0931    Allergies as of 03/26/2022 - Review Complete 03/26/2022  Allergen Reaction Noted   Lisinopril Cough  10/17/2012    Family History  Problem Relation Age of Onset   Heart attack Father    Heart attack Paternal Grandfather    Leukemia Sister    Heart attack Brother     Social History   Socioeconomic History   Marital status: Married    Spouse name: Not on file   Number of children: Not on file   Years of education: Not on file   Highest education level: Not  on file  Occupational History   Not on file  Tobacco Use   Smoking status: Every Day    Types: Cigars   Smokeless tobacco: Never   Tobacco comments:    Smoke 3-4 cigars a day  Vaping Use   Vaping Use: Never used  Substance and Sexual Activity   Alcohol use: Yes    Comment: occasional   Drug use: Never   Sexual activity: Not on file  Other Topics Concern   Not on file  Social History Narrative   Not on file   Social Determinants of Health   Financial Resource Strain: Low Risk  (12/20/2020)   Overall Financial Resource Strain (CARDIA)    Difficulty of Paying Living Expenses: Not hard at all  Food Insecurity: No Food Insecurity (03/27/2022)   Hunger Vital Sign    Worried About Running Out of Food in the Last Year: Never true    Ran Out of Food in the Last Year: Never true  Transportation Needs: No Transportation Needs (03/27/2022)   PRAPARE - Hydrologist (Medical): No    Lack of Transportation (Non-Medical): No  Physical Activity: Sufficiently Active (12/20/2020)   Exercise Vital Sign    Days of Exercise per Week: 5 days    Minutes of Exercise per Session: 30 min  Recent Concern: Physical Activity - Inactive (11/17/2020)   Exercise Vital Sign    Days of Exercise per Week: 0 days    Minutes of Exercise per Session: 0 min  Stress: Not on file  Social Connections: Not on file  Intimate Partner Violence: Not At Risk (03/27/2022)   Humiliation, Afraid, Rape, and Kick questionnaire    Fear of Current or Ex-Partner: No    Emotionally Abused: No    Physically Abused: No    Sexually Abused:  No    Review of Systems: Gen: Denies fever, chills, loss of appetite, change in weight or weight loss CV: Denies chest pain, heart palpitations, syncope, edema  Resp: Denies shortness of breath with rest, cough, wheezing GI: +abdominal pain radiating to back, +decreased PO intake, +n/v GU : Denies urinary burning, urinary frequency, urinary incontinence.  MS: Denies joint pain,swelling, cramping Derm: Denies rash, itching, dry skin Psych: Denies depression, anxiety,confusion, or memory loss Heme: Denies bruising, bleeding, and enlarged lymph nodes.  Physical Exam: Vital signs in last 24 hours: Temp:  [98 F (36.7 C)-98.3 F (36.8 C)] 98.2 F (36.8 C) (11/08 0438) Pulse Rate:  [82-95] 90 (11/08 0438) Resp:  [10-21] 18 (11/08 0438) BP: (82-139)/(58-84) 127/83 (11/08 0438) SpO2:  [91 %-98 %] 97 % (11/08 0438) FiO2 (%):  [21 %] 21 % (11/07 2039) Weight:  [68 kg-69.2 kg] 69.2 kg (11/07 2118) Last BM Date : 03/26/22 General:   Alert,  Well-developed, well-nourished, pleasant and cooperative in NAD Head:  Normocephalic and atraumatic. Eyes:  Sclera clear, no icterus.   Conjunctiva pink. Ears:  Normal auditory acuity. Nose:  No deformity, discharge,  or lesions. Mouth:  No deformity or lesions, dentition normal. Lungs:  Clear throughout to auscultation.   No wheezes, crackles, or rhonchi. No acute distress. Heart:  Regular rate and rhythm; no murmurs, clicks, rubs,  or gallops. Abdomen:  Soft, nontender and nondistended. No masses, hepatosplenomegaly or hernias noted. Normal bowel sounds, without guarding, and without rebound.   Rectal:  Deferred until time of colonoscopy.   Msk:  Symmetrical without gross deformities. Normal posture. Pulses:  Normal pulses noted. Extremities:  Without clubbing or edema.  Neurologic:  Alert and  oriented x4;  grossly normal neurologically. Skin:  Intact without significant lesions or rashes. Psych:  Alert and cooperative. Normal mood and  affect.  Intake/Output from previous day: 11/07 0701 - 11/08 0700 In: 2000 [IV Piggyback:2000] Out: 2 [Urine:2] Intake/Output this shift: No intake/output data recorded.  Lab Results: Recent Labs    03/26/22 1453 03/27/22 0548  WBC 6.7 5.6  HGB 12.7* 12.3*  HCT 37.9* 37.3*  PLT 367 317   BMET Recent Labs    03/26/22 1453 03/27/22 0548  NA 133* 136  K 3.1* 3.6  CL 91* 98  CO2 31 28  GLUCOSE 115* 105*  BUN 27* 22  CREATININE 1.14 1.00  CALCIUM 8.1* 7.7*   LFT Recent Labs    03/26/22 1453 03/27/22 0548  PROT 6.4* 5.4*  ALBUMIN 3.1* 2.5*  AST 37 32  ALT 34 31  ALKPHOS 121 100  BILITOT 1.7* 1.6*    Studies/Results: DG Chest Portable 1 View  Result Date: 03/26/2022 CLINICAL DATA:  NG tube placement. EXAM: PORTABLE CHEST 1 VIEW COMPARISON:  03/04/2022. FINDINGS: The heart size and mediastinal contours are stable. There is atherosclerotic calcification of the aorta. The visualized lungs are clear. No effusion is seen. A stable right chest port terminates over the superior vena cava. An enteric tube courses over the stomach and out of the field of view. IMPRESSION: 1. No active disease. 2. An enteric tube courses over the stomach and out of the field of view. Electronically Signed   By: Brett Fairy M.D.   On: 03/26/2022 20:06   CT ABDOMEN PELVIS W CONTRAST  Result Date: 03/26/2022 CLINICAL DATA:  Pancreatic cancer, biliary stent. Nausea, vomiting, and abdominal pain. Weight loss. * Tracking Code: BO * EXAM: CT ABDOMEN AND PELVIS WITH CONTRAST TECHNIQUE: Multidetector CT imaging of the abdomen and pelvis was performed using the standard protocol following bolus administration of intravenous contrast. RADIATION DOSE REDUCTION: This exam was performed according to the departmental dose-optimization program which includes automated exposure control, adjustment of the mA and/or kV according to patient size and/or use of iterative reconstruction technique. CONTRAST:  141m  OMNIPAQUE IOHEXOL 300 MG/ML  SOLN COMPARISON:  PET-CT 02/21/2022 and CT abdomen 01/29/2022 FINDINGS: Lower chest: Unremarkable Hepatobiliary: Percutaneous biliary drain in place with loop formed in the right bile duct but near the confluence of the biliary tree. This is also close to the proximal portion of a biliary stent. There is still moderate intrahepatic biliary dilatation, although improved from 01/29/2022. I do not see a well-defined liver lesion corresponding with the focus of accentuated metabolic activity shown on prior PET-CT. Highly indistinct porta hepatis regions with ill definition of tissue planes. Mild perihepatic ascites. Pancreas: Dilated dorsal pancreatic duct extending into an indistinctly marginated pancreatic head lesion presumably reflecting the underlying tumor, with ill-defined hypodensity in this region measuring about 3.5 by 1.9 cm on image 24 of series 2, and abutting the margin of the SMA although not completely wrapping around the SMA. The prior lesser sac fluid collection shown on 01/29/2022 is no longer present but there are bandlike cords of soft tissue density below the pancreatic body as on image 39 of series 5 which could be due to local tumor extension, inflammation, or less likely infection. Spleen: Unremarkable Adrenals/Urinary Tract: Stable small renal cyst do not require individual follow up. Urinary bladder unremarkable. No hydronephrosis. Adrenal glands unremarkable. Stomach/Bowel: Notably distended stomach. Narrowed gastrojejunostomy on images 32 through 37 of series 2, significance uncertain but  relative outlet obstruction along the gastrojejunostomy cannot be excluded. Ill definition of the small bowel receiving pancreatic and biliary drainage. Borderline prominence of stool in the colon. A loop of bowel containing an air-fluid level extends anterior to the left hepatic lobe on image 21 series 2, this is more cephalad than prior extension of bowel. Vascular/Lymphatic:  Atherosclerosis is present, including aortoiliac atherosclerotic disease. Narrowing of the proximal celiac trunk possibly due to median arcuate ligament and atheromatous vascular disease. There is also mild atherosclerotic narrowing of the proximal SMA. As noted above, indistinct hypodense soft tissue abuts the right margin of the SMA on image 24 series 2 may represent tumor. Collateral vessels extend around the stomach. Reproductive: Mild prostatomegaly. Other: There is some mild hazy stranding in central mesentery more notable indistinct stranding along the porta hepatis. Trace perihepatic ascites. Musculoskeletal: Bridging spurring of both sacroiliac joints. Lower lumbar degenerative facet arthropathy. Lumbar impingement at L4-5 due to spondylosis and degenerative disc disease. IMPRESSION: 1. Notably distended stomach with narrowed/angulated appearance of the gastrojejunostomy, significance uncertain but relative outlet obstruction along the gastrojejunostomy cannot be excluded. 2. Ill definition of the porta hepatis regions with ill definition of tissue planes. Indistinct hypodense soft tissue in the region of the pancreatic head and abutting the right margin of the SMA, compatible tumor. 3. Prior lesser sac fluid collection is no longer present but there are bandlike cords of soft tissue density below the pancreatic body which could be due to local tumor extension, inflammation, or less likely infection. 4. Percutaneous biliary drain in place with loop formed in the right bile duct but near the confluence of the biliary tree. Adjacent stent in the common hepatic duct. There is still moderate intrahepatic biliary dilatation, although improved from 01/29/2022. 5. Mild perihepatic ascites. 6. Narrowing of the proximal celiac trunk and proximal SMA due to median arcuate ligament and atheromatous vascular disease. 7. Lumbar impingement at L4-5 due to spondylosis and degenerative disc disease. 8. Mild prostatomegaly.  9. Borderline prominence of stool in the colon. 10. Aortic atherosclerosis. Aortic Atherosclerosis (ICD10-I70.0). Electronically Signed   By: Van Clines M.D.   On: 03/26/2022 17:22    Impression: Alexander Banks is a 66 y.o. year old male with medical history of IDA, DM type 2, HLD, HTN, pancreatic adenocarcinoma s/p partial whipple and chemotherapy who has undergone external biliary drainage placement as well as biliary stenting who presented to the ED yesterday with c/o abdominal and back pain x several weeks. PO Intake of food became intolerable. one episode of vomiting. General surgery consulted initially due to abnormal CT findings, however, they requested GI evaluation for possible GOO.   concern for gastric outlet obstruction secondary to known pancreatic adenocarcinoma: Notably history of Complex case of pancreatic adenocarcinoma s/p partial whipple procedure and chemotherapy who has required biliary stenting due to secondary stricture, most recently with stenting and angioplasty of stricture at the choledocho jejunostomy, with biliary drain exchange for externalized pigtail drain via IR at the end of October.recommendations via surg onc to pursue second line chemo/possible clinical trials/radiation therapy given continued rise in CA 19-9, worrisome for recurrence, deemed not a surgical candidate.  Patient c/o abdominal and back pain for the past few weeks though worse over the past few days with PO intake of solids becoming intolerable. He had one episode of vomiting. CT A/P on arrival to the ED concerning for gastric outlet obstruction, generally surgery consulted but recommended GI see the patient for further evaluation. NG tube was placed and  patient has had resolution of his abdominal pain though is uncomfortable due to tube placement. Unfortunately he did received a dose of eliquis this morning which will need to be on hold moving forward. Recommend proceeding with EGD for potential  treatment of GOO, now potentially on Friday given presence of recent ACs. Indications, risks and benefits of procedure discussed in detail with patient. Patient verbalized understanding and is in agreement to proceed with EGD at this time.   Notably patients LFTs remain WNL other than consistently elevated T bili of 1.6. no presence of fevers or chills. He has upcoming biliary drain exchange with IR on 11/14    Plan: NG tube to remain in place  [facility] for EGD Friday Continue supportive measures  Continue creon    LOS: 1 day    03/27/2022, 10:51 AM  Cypher Paule L. Alver Sorrow, MSN, APRN, AGNP-C Adult-Gerontology Nurse Practitioner [facility]

## 2022-03-27 NOTE — Assessment & Plan Note (Addendum)
-   CT abdomen shows a distended stomach with narrowed appearance of gastrojejunostomy.  Extension of known tumor.  Percutaneous biliary drain performed in the right bile duct, adjacent to the stent in the common bile duct with some biliary dilatation improved from January 29, 2022 - General surgery was consulted and recommended NG tube - NG tube in place on low intermittent suction - pain improved after NG placement and suctioning EGD COMPLETED ON 11/10 WITH DR. Abbey Chatters:   The examined esophagus was normal.      A medium amount of food (residue) was found in the gastric body.      Localized mucosal changes characterized by congestion were found in the       jejunum, with associated tight lumen, no stricture. Possible external       comrpession. Biopsies were taken with a cold forceps for histology.

## 2022-03-27 NOTE — Consult Note (Addendum)
Reason for Consult: Gastric outlet obstruction Referring Physician: Marcello Fennel is an 66 y.o. male with a past medical history of T2DM, HTN, DVT on Eliquis (last dose 11/7 at 51), and Pancreatic Adenocarcinoma s/p Whipple procedure in 2022.  HPI: Patient presented to the ED from his PCP's office. He had 2-3 weeks of constant mid epigastric abdominal pain that was constant. The pain was a pressure that did not resolve with oxycodone. He also had mid and lower back pain that felt like spasms. He endorsed severe postprandial nausea and nonbloody vomiting. He lost about 10 lbs this month. He was not tolerating solid foods but was drinking liquids. His bowel movements have been more frequent with Miralax and Dulcolax. His last BM was yesterday. At his PCP office, he was hypotensive, dehydrated, and weak. He denied fevers, melena, hematochezia, and jaundice.  In the ED, patient was hypotensive to the 80s/50s. He was found to have a normal WBC, hypochloremia 91, hypokalemia 3.1, hypoalbuminemia 3.1, and hyperbilirubinemia 1.6. CT scan was notable for massively distended stomach with concern for gastrojejunostomy outlet obstruction and fluid sac collection concerning for tumor extension, inflammation, or infection. NG tube was placed.  Today, patient reports his abdominal pain, back pain, nausea, and vomiting have resolved. He has an appetite which he has not had in over a year. His wife reports that he looks less weak. He has about 330m of dark fluid for NG output. Output was emptied overnight x1 per patient.  Past Medical History:  Diagnosis Date   Anemia, iron deficiency    Diabetes mellitus [place]    [place]   Hearing loss    Bil hearing aids   Hyperlipidemia    Hypertension    Port-A-Cath in place 12/26/2020   Prediabetes    Sleep apnea     Past Surgical History:  Procedure Laterality Date   BILIARY STENT PLACEMENT N/A 11/09/2020   Procedure: BILIARY  STENT PLACEMENT - 155F X 5 CM STENT;  Surgeon: RRogene Houston MD;  Location: [facility];  Service: Endoscopy;  Laterality: N/A;   BILIARY STENT PLACEMENT N/A 12/27/2020   Procedure: BILIARY STENT PLACEMENT;  Surgeon: RRogene Houston MD;  Location: [facility];  Service: Endoscopy;  Laterality: N/A;   COLONOSCOPY     ENTEROSCOPY N/A 12/27/2021   Procedure: ENTEROSCOPY;  Surgeon: Mansouraty, GTelford Nab, MD;  [facility];  Service: Gastroenterology;  Laterality: N/A;   ERCP N/A 11/09/2020   Procedure: ENDOSCOPIC RETROGRADE CHOLANGIOPANCREATOGRAPHY (ERCP);  Surgeon: RRogene Houston MD;  Location: [facility];  Service: Endoscopy;  Laterality: N/A;   ERCP N/A 12/27/2020   Procedure: ENDOSCOPIC RETROGRADE CHOLANGIOPANCREATOGRAPHY (ERCP);  Surgeon: RRogene Houston MD;  Location: [facility];  Service: Endoscopy;  Laterality: N/A;   ESOPHAGOGASTRODUODENOSCOPY (EGD) WITH PROPOFOL N/A 11/30/2020   Procedure: ESOPHAGOGASTRODUODENOSCOPY (EGD) WITH PROPOFOL;  Surgeon: JMilus Banister MD;  Location: [facility];  Service: Endoscopy;  Laterality: N/A;   EUS N/A 11/30/2020   Procedure: UPPER ENDOSCOPIC ULTRASOUND (EUS) RADIAL;  Surgeon: JMilus Banister MD;  Location: [facility];  Service: Endoscopy;  Laterality: N/A;   FINE NEEDLE ASPIRATION N/A 11/30/2020   Procedure: FINE NEEDLE ASPIRATION (FNA) LINEAR;  Surgeon: JMilus Banister MD;  Location: [facility];  Service: Endoscopy;  Laterality: N/A;   GIVENS CAPSULE STUDY     IR BILIARY DRAIN PLACEMENT WITH CHOLANGIOGRAM  02/04/2022   IR BILIARY STENT(S) EXISTING ACCESS INC DILATION CATH EXCHANGE  03/15/2022   IR  IMAGING GUIDED PORT INSERTION  12/18/2020   IR PATIENT EVAL TECH 0-60 MINS  03/08/2022   KNEE SURGERY     4 surgeries on left, 2 on right knee   SHOULDER SURGERY     right shoulder   SPHINCTEROTOMY N/A 11/09/2020   Procedure: SHORT BILIARY SPHINCTEROTOMY;  Surgeon: Rogene Houston, MD;  Location: [facility];  Service: Endoscopy;  Laterality:  N/A;   WHIPPLE PROCEDURE      Family History  Problem Relation Age of Onset   Heart attack Father    Heart attack Paternal Grandfather    Leukemia Sister    Heart attack Brother     Social History:  reports that he has been smoking cigars. He has never used smokeless tobacco. He reports current alcohol use. He reports that he does not use drugs.  Allergies:  Allergies  Allergen Reactions   Lisinopril Cough    Medications: I have reviewed the patient's current medications.  Results for orders placed or performed during the hospital encounter of 03/26/22 (from the past 48 hour(s))  Lipase, blood     Status: None   Collection Time: 03/26/22  2:53 PM  Result Value Ref Range   Lipase 20 11 - 51 U/L    Comment: Performed at [facility], [address], [place], [place] [address]  Comprehensive metabolic panel     Status: Abnormal   Collection Time: 03/26/22  2:53 PM  Result Value Ref Range   Sodium 133 (L) 135 - 145 mmol/L   Potassium 3.1 (L) 3.5 - 5.1 mmol/L   Chloride 91 (L) 98 - 111 mmol/L   CO2 31 22 - 32 mmol/L   Glucose, Bld 115 (H) 70 - 99 mg/dL    Comment: Glucose reference range applies only to samples taken after fasting for at least 8 hours.   BUN 27 (H) 8 - 23 mg/dL   Creatinine, Ser 1.14 0.61 - 1.24 mg/dL   Calcium 8.1 (L) 8.9 - 10.3 mg/dL   Total Protein 6.4 (L) 6.5 - 8.1 g/dL   Albumin 3.1 (L) 3.5 - 5.0 g/dL   AST 37 15 - 41 U/L   ALT 34 0 - 44 U/L   Alkaline Phosphatase 121 38 - 126 U/L   Total Bilirubin 1.7 (H) 0.3 - 1.2 mg/dL   GFR, Estimated >60 >60 mL/min    Comment: (NOTE) Calculated using the CKD-EPI Creatinine Equation (2021)    Anion gap 11 5 - 15    Comment: Performed at [facility], [address], [place], [place] [address]  CBC with Differential     Status: Abnormal   Collection Time: 03/26/22  2:53 PM  Result Value Ref Range   WBC 6.7 4.0 - 10.5 K/uL   RBC 4.01 (L) 4.22 - 5.81 MIL/uL   Hemoglobin 12.7 (L) 13.0 - 17.0 g/dL   HCT 37.9 (L)  39.0 - 52.0 %   MCV 94.5 80.0 - 100.0 fL   MCH 31.7 26.0 - 34.0 pg   MCHC 33.5 30.0 - 36.0 g/dL   RDW 14.0 11.5 - 15.5 %   Platelets 367 150 - 400 K/uL   nRBC 0.0 0.0 - 0.2 %   Neutrophils Relative % 60 %   Neutro Abs 4.0 1.7 - 7.7 K/uL   Lymphocytes Relative 26 %   Lymphs Abs 1.7 0.7 - 4.0 K/uL   Monocytes Relative 13 %   Monocytes Absolute 0.8 0.1 - 1.0 K/uL   Eosinophils Relative 1 %  Eosinophils Absolute 0.1 0.0 - 0.5 K/uL   Basophils Relative 0 %   Basophils Absolute 0.0 0.0 - 0.1 K/uL   Immature Granulocytes 0 %   Abs Immature Granulocytes 0.02 0.00 - 0.07 K/uL    Comment: Performed at [facility], [address], [place], [place] [address]  Lactic acid, plasma     Status: None   Collection Time: 03/26/22  2:53 PM  Result Value Ref Range   Lactic Acid, Venous 1.0 0.5 - 1.9 mmol/L    Comment: Performed at [facility], [address], [place], [place] [address]  Urinalysis, Routine w reflex microscopic Urine, Clean Catch     Status: Abnormal   Collection Time: 03/26/22  5:48 PM  Result Value Ref Range   Color, Urine AMBER (A) YELLOW    Comment: BIOCHEMICALS MAY BE AFFECTED BY COLOR   APPearance HAZY (A) CLEAR   Specific Gravity, Urine 1.025 1.005 - 1.030   pH 6.0 5.0 - 8.0   Glucose, UA NEGATIVE NEGATIVE mg/dL   Hgb urine dipstick LARGE (A) NEGATIVE   Bilirubin Urine NEGATIVE NEGATIVE   Ketones, ur 20 (A) NEGATIVE mg/dL   Protein, ur 30 (A) NEGATIVE mg/dL   Nitrite NEGATIVE NEGATIVE   Leukocytes,Ua NEGATIVE NEGATIVE   RBC / HPF >50 (H) 0 - 5 RBC/hpf   WBC, UA 6-10 0 - 5 WBC/hpf   Bacteria, UA RARE (A) NONE SEEN   Mucus PRESENT     Comment: Performed at [facility], [address], [place], [place] [address]  Glucose, capillary     Status: Abnormal   Collection Time: 03/26/22  9:52 PM  Result Value Ref Range   Glucose-Capillary 116 (H) 70 - 99 mg/dL    Comment: Glucose reference range applies only to samples taken after fasting for at least 8 hours.  Glucose,  capillary     Status: Abnormal   Collection Time: 03/27/22 12:09 AM  Result Value Ref Range   Glucose-Capillary 114 (H) 70 - 99 mg/dL    Comment: Glucose reference range applies only to samples taken after fasting for at least 8 hours.  Glucose, capillary     Status: Abnormal   Collection Time: 03/27/22  4:41 AM  Result Value Ref Range   Glucose-Capillary 105 (H) 70 - 99 mg/dL    Comment: Glucose reference range applies only to samples taken after fasting for at least 8 hours.  Comprehensive metabolic panel     Status: Abnormal   Collection Time: 03/27/22  5:48 AM  Result Value Ref Range   Sodium 136 135 - 145 mmol/L   Potassium 3.6 3.5 - 5.1 mmol/L   Chloride 98 98 - 111 mmol/L   CO2 28 22 - 32 mmol/L   Glucose, Bld 105 (H) 70 - 99 mg/dL    Comment: Glucose reference range applies only to samples taken after fasting for at least 8 hours.   BUN 22 8 - 23 mg/dL   Creatinine, Ser 1.00 0.61 - 1.24 mg/dL   Calcium 7.7 (L) 8.9 - 10.3 mg/dL   Total Protein 5.4 (L) 6.5 - 8.1 g/dL   Albumin 2.5 (L) 3.5 - 5.0 g/dL   AST 32 15 - 41 U/L   ALT 31 0 - 44 U/L   Alkaline Phosphatase 100 38 - 126 U/L   Total Bilirubin 1.6 (H) 0.3 - 1.2 mg/dL   GFR, Estimated >60 >60 mL/min    Comment: (NOTE) Calculated using the CKD-EPI Creatinine Equation (2021)    Anion  gap 10 5 - 15    Comment: Performed at [facility], [address] [place], [place] [address]  Magnesium     Status: None   Collection Time: 03/27/22  5:48 AM  Result Value Ref Range   Magnesium 1.8 1.7 - 2.4 mg/dL    Comment: Performed at [facility], [address], [place], [place] [address]  CBC with Differential/Platelet     Status: Abnormal   Collection Time: 03/27/22  5:48 AM  Result Value Ref Range   WBC 5.6 4.0 - 10.5 K/uL   RBC 3.90 (L) 4.22 - 5.81 MIL/uL   Hemoglobin 12.3 (L) 13.0 - 17.0 g/dL   HCT 37.3 (L) 39.0 - 52.0 %   MCV 95.6 80.0 - 100.0 fL   MCH 31.5 26.0 - 34.0 pg   MCHC 33.0 30.0 - 36.0 g/dL   RDW 14.0 11.5 -  15.5 %   Platelets 317 150 - 400 K/uL   nRBC 0.0 0.0 - 0.2 %   Neutrophils Relative % 60 %   Neutro Abs 3.4 1.7 - 7.7 K/uL   Lymphocytes Relative 25 %   Lymphs Abs 1.4 0.7 - 4.0 K/uL   Monocytes Relative 12 %   Monocytes Absolute 0.7 0.1 - 1.0 K/uL   Eosinophils Relative 1 %   Eosinophils Absolute 0.1 0.0 - 0.5 K/uL   Basophils Relative 1 %   Basophils Absolute 0.0 0.0 - 0.1 K/uL   Immature Granulocytes 1 %   Abs Immature Granulocytes 0.03 0.00 - 0.07 K/uL    Comment: Performed at [facility], [address], [place], [place] [address]  Glucose, capillary     Status: Abnormal   Collection Time: 03/27/22  7:27 AM  Result Value Ref Range   Glucose-Capillary 102 (H) 70 - 99 mg/dL    Comment: Glucose reference range applies only to samples taken after fasting for at least 8 hours.    DG Chest Portable 1 View  Result Date: 03/26/2022 CLINICAL DATA:  NG tube placement. EXAM: PORTABLE CHEST 1 VIEW COMPARISON:  03/04/2022. FINDINGS: The heart size and mediastinal contours are stable. There is atherosclerotic calcification of the aorta. The visualized lungs are clear. No effusion is seen. A stable right chest port terminates over the superior vena cava. An enteric tube courses over the stomach and out of the field of view. IMPRESSION: 1. No active disease. 2. An enteric tube courses over the stomach and out of the field of view. Electronically Signed   By: Brett Fairy M.D.   On: 03/26/2022 20:06   CT ABDOMEN PELVIS W CONTRAST  Result Date: 03/26/2022 CLINICAL DATA:  Pancreatic cancer, biliary stent. Nausea, vomiting, and abdominal pain. Weight loss. * Tracking Code: BO * EXAM: CT ABDOMEN AND PELVIS WITH CONTRAST TECHNIQUE: Multidetector CT imaging of the abdomen and pelvis was performed using the standard protocol following bolus administration of intravenous contrast. RADIATION DOSE REDUCTION: This exam was performed according to the departmental dose-optimization program which includes  automated exposure control, adjustment of the mA and/or kV according to patient size and/or use of iterative reconstruction technique. CONTRAST:  164m OMNIPAQUE IOHEXOL 300 MG/ML  SOLN COMPARISON:  PET-CT 02/21/2022 and CT abdomen 01/29/2022 FINDINGS: Lower chest: Unremarkable Hepatobiliary: Percutaneous biliary drain in place with loop formed in the right bile duct but near the confluence of the biliary tree. This is also close to the proximal portion of a biliary stent. There is still moderate intrahepatic biliary dilatation, although improved from 01/29/2022. I do not  see a well-defined liver lesion corresponding with the focus of accentuated metabolic activity shown on prior PET-CT. Highly indistinct porta hepatis regions with ill definition of tissue planes. Mild perihepatic ascites. Pancreas: Dilated dorsal pancreatic duct extending into an indistinctly marginated pancreatic head lesion presumably reflecting the underlying tumor, with ill-defined hypodensity in this region measuring about 3.5 by 1.9 cm on image 24 of series 2, and abutting the margin of the SMA although not completely wrapping around the SMA. The prior lesser sac fluid collection shown on 01/29/2022 is no longer present but there are bandlike cords of soft tissue density below the pancreatic body as on image 39 of series 5 which could be due to local tumor extension, inflammation, or less likely infection. Spleen: Unremarkable Adrenals/Urinary Tract: Stable small renal cyst do not require individual follow up. Urinary bladder unremarkable. No hydronephrosis. Adrenal glands unremarkable. Stomach/Bowel: Notably distended stomach. Narrowed gastrojejunostomy on images 32 through 37 of series 2, significance uncertain but relative outlet obstruction along the gastrojejunostomy cannot be excluded. Ill definition of the small bowel receiving pancreatic and biliary drainage. Borderline prominence of stool in the colon. A loop of bowel containing an  air-fluid level extends anterior to the left hepatic lobe on image 21 series 2, this is more cephalad than prior extension of bowel. Vascular/Lymphatic: Atherosclerosis is present, including aortoiliac atherosclerotic disease. Narrowing of the proximal celiac trunk possibly due to median arcuate ligament and atheromatous vascular disease. There is also mild atherosclerotic narrowing of the proximal SMA. As noted above, indistinct hypodense soft tissue abuts the right margin of the SMA on image 24 series 2 may represent tumor. Collateral vessels extend around the stomach. Reproductive: Mild prostatomegaly. Other: There is some mild hazy stranding in central mesentery more notable indistinct stranding along the porta hepatis. Trace perihepatic ascites. Musculoskeletal: Bridging spurring of both sacroiliac joints. Lower lumbar degenerative facet arthropathy. Lumbar impingement at L4-5 due to spondylosis and degenerative disc disease. IMPRESSION: 1. Notably distended stomach with narrowed/angulated appearance of the gastrojejunostomy, significance uncertain but relative outlet obstruction along the gastrojejunostomy cannot be excluded. 2. Ill definition of the porta hepatis regions with ill definition of tissue planes. Indistinct hypodense soft tissue in the region of the pancreatic head and abutting the right margin of the SMA, compatible tumor. 3. Prior lesser sac fluid collection is no longer present but there are bandlike cords of soft tissue density below the pancreatic body which could be due to local tumor extension, inflammation, or less likely infection. 4. Percutaneous biliary drain in place with loop formed in the right bile duct but near the confluence of the biliary tree. Adjacent stent in the common hepatic duct. There is still moderate intrahepatic biliary dilatation, although improved from 01/29/2022. 5. Mild perihepatic ascites. 6. Narrowing of the proximal celiac trunk and proximal SMA due to median  arcuate ligament and atheromatous vascular disease. 7. Lumbar impingement at L4-5 due to spondylosis and degenerative disc disease. 8. Mild prostatomegaly. 9. Borderline prominence of stool in the colon. 10. Aortic atherosclerosis. Aortic Atherosclerosis (ICD10-I70.0). Electronically Signed   By: Van Clines M.D.   On: 03/26/2022 17:22    ROS:  Pertinent items are noted in HPI.  Blood pressure 127/83, pulse 90, temperature 98.2 F (36.8 C), temperature source Oral, resp. rate 18, height '6\' 2"'$  (1.88 m), weight 69.2 kg, SpO2 97 %. Physical Exam:  Gen: Well appearing in no distress, sitting up in bed with nasogastric tube. Pulm: Normal work of breathing. No wheezing, crackles, or rhales. CV: Normal  rate. No murmurs. Normal S1 and S2. Abd: Non-distended abdomen. No tenderness to palpation. No guarding, rigidity, or rebound tenderness.  Assessment/Plan:  Gastric Outlet Obstruction Given patient's history, CT findings, and history of Whipple procedure, he likely has a gastric outlet obstruction. It is unclear whether the source of outlet obstruction is from extrinsic causes such as an extension of his pancreatic tumor, or intrinsic causes such as stricture formation from the anastomosis at the gastrojejunostomy site, duodenitis, or other inflammation. -Plan for GI consult for EGD to identify source of outlet obstruction. Defer GI contrast study given patient's known outlet obstruction. -Continue NG tube decompression  FEN/GI -Remain NPO   Leonette Nutting 03/27/2022, 9:36 AM

## 2022-03-28 ENCOUNTER — Inpatient Hospital Stay: Payer: BC Managed Care – PPO

## 2022-03-28 ENCOUNTER — Inpatient Hospital Stay: Payer: BC Managed Care – PPO | Admitting: Hematology

## 2022-03-28 DIAGNOSIS — E43 Unspecified severe protein-calorie malnutrition: Secondary | ICD-10-CM

## 2022-03-28 DIAGNOSIS — G4733 Obstructive sleep apnea (adult) (pediatric): Secondary | ICD-10-CM

## 2022-03-28 DIAGNOSIS — K311 Adult hypertrophic pyloric stenosis: Secondary | ICD-10-CM | POA: Diagnosis not present

## 2022-03-28 DIAGNOSIS — D6869 Other thrombophilia: Secondary | ICD-10-CM | POA: Diagnosis not present

## 2022-03-28 LAB — GLUCOSE, CAPILLARY
Glucose-Capillary: 112 mg/dL — ABNORMAL HIGH (ref 70–99)
Glucose-Capillary: 116 mg/dL — ABNORMAL HIGH (ref 70–99)
Glucose-Capillary: 117 mg/dL — ABNORMAL HIGH (ref 70–99)
Glucose-Capillary: 130 mg/dL — ABNORMAL HIGH (ref 70–99)
Glucose-Capillary: 94 mg/dL (ref 70–99)
Glucose-Capillary: 99 mg/dL (ref 70–99)

## 2022-03-28 LAB — URINE CULTURE: Culture: NO GROWTH

## 2022-03-28 LAB — CBC
HCT: 36.4 % — ABNORMAL LOW (ref 39.0–52.0)
Hemoglobin: 11.8 g/dL — ABNORMAL LOW (ref 13.0–17.0)
MCH: 31.5 pg (ref 26.0–34.0)
MCHC: 32.4 g/dL (ref 30.0–36.0)
MCV: 97.1 fL (ref 80.0–100.0)
Platelets: 312 10*3/uL (ref 150–400)
RBC: 3.75 MIL/uL — ABNORMAL LOW (ref 4.22–5.81)
RDW: 14 % (ref 11.5–15.5)
WBC: 6.7 10*3/uL (ref 4.0–10.5)
nRBC: 0 % (ref 0.0–0.2)

## 2022-03-28 LAB — BASIC METABOLIC PANEL
Anion gap: 9 (ref 5–15)
BUN: 21 mg/dL (ref 8–23)
CO2: 26 mmol/L (ref 22–32)
Calcium: 8 mg/dL — ABNORMAL LOW (ref 8.9–10.3)
Chloride: 107 mmol/L (ref 98–111)
Creatinine, Ser: 1.04 mg/dL (ref 0.61–1.24)
GFR, Estimated: >60 mL/min (ref >60)
Glucose, Bld: 108 mg/dL — ABNORMAL HIGH (ref 70–99)
Potassium: 4.2 mmol/L (ref 3.5–5.1)
Sodium: 142 mmol/L (ref 135–145)

## 2022-03-28 LAB — MAGNESIUM: Magnesium: 1.8 mg/dL (ref 1.7–2.4)

## 2022-03-28 MED ORDER — MORPHINE SULFATE (PF) 2 MG/ML IV SOLN
1.0000 mg | INTRAVENOUS | Status: DC | PRN
  Administered 2022-03-28 – 2022-03-29 (×2): 1 mg via INTRAVENOUS
  Filled 2022-03-28 (×2): qty 1

## 2022-03-28 MED ORDER — MAGNESIUM SULFATE 2 GM/50ML IV SOLN
2.0000 g | Freq: Once | INTRAVENOUS | Status: AC
  Administered 2022-03-28: 2 g via INTRAVENOUS
  Filled 2022-03-28: qty 50

## 2022-03-28 MED ORDER — POTASSIUM CHLORIDE IN NACL 20-0.9 MEQ/L-% IV SOLN: INTRAVENOUS | Status: DC

## 2022-03-28 NOTE — Progress Notes (Signed)
PROGRESS NOTE   Alexander Banks  OIZ:[id number] DOB: 11/25/1955 DOA: 03/26/2022 PCP: [name], MD   Chief Complaint  Patient presents with   Abdominal Pain   Level of care: Med-Surg  Brief Admission History:  66 y.o. male with medical history significant of iron deficiency anemia, diabetes mellitus type 2, hyperlipidemia, hypertension, prediabetes, and more presents to the ED with a chief complaint of abdominal and back pain.  Patient reports that started several weeks ago.  Its been worse since it started.  He just cannot take the pain anymore.  He reports the pain in his abdomen feels like a pressure.  It is constant.  Oxycodone helped it minimally.  He has had nausea and vomiting x1 that is nonbloody.  He has not had diarrhea.  Patient reports his bowel movements have been irregular for about a week but prior to that he was feeling pretty constipated.  He has had no hematochezia or melena.  Patient reports he continues remember when his last normal meal was.  He has no appetite.  The last time he had any p.o. solid food intake was 3 days ago.  In the interim he has been drinking water and Gatorade.  Patient reports the pain in his abdomen and pain is back for 2 separate pains.  The one in his abdomen feels like pressure.  The one in his back feels like a muscle spasm that is constant.  Oxycodone helps the 1 in his back slightly as well.  He has not had a fever.  He has no new pains.  He has no dysuria.  Patient reports at this time his pain is resolved and he is not sure what made it go away.  Patient denies any numbness.  He is in full control of his urine and bowels.  Patient has no other complaints at this time.     Assessment and Plan: * Gastric outlet obstruction -- s/p NG placement with good output reported -- continue NPO status  -- GI service planning EGD on 11/10 giving time for apixaban washout  Pressure injury of skin -- continue skin care protocol   Tobacco use  disorder - Patient is a current smoker. - Nicotine patch ordered - Encouraged regarding cessation  Hypokalemia - this has been repleted    Abdominal pain - CT abdomen shows a distended stomach with narrowed appearance of gastrojejunostomy.  Extension of known tumor.  Percutaneous biliary drain performed in the right bile duct, adjacent to the stent in the common bile duct with some biliary dilatation improved from January 29, 2022 - General surgery was consulted and recommended NG tube - NG tube in place on low intermittent suction - pain improved after NG placement and suctioning  Diabetes mellitus secondary to pancreatic insufficiency ([place]) - Patient takes 6 units of basal insulin at baseline - Hold basal insulin, sliding scale coverage - continue CBG monitoring  CBG (last 3)  Recent Labs    03/28/22 0515 03/28/22 0728 03/28/22 1118  GLUCAP 116* 117* 99     Hypothyroidism - Continue Synthroid when able to take p.o. again  Obstructive sleep apnea -- will offer nightly CPAP   DVT prophylaxis: SCD Code Status: Full  Family Communication: wife 11/8 Disposition: Status is: Inpatient Remains inpatient appropriate because: IV treatments    Consultants:  Surgery GI  Procedures:   Antimicrobials:    Subjective: Pt irritated by NG tube.  Abd pain is improved since NG was placed.  He was visiting  with his PCP this morning.    Objective: Vitals:   03/27/22 1359 03/27/22 1846 03/28/22 0506 03/28/22 0900  BP: 127/83 124/80 134/84 (!) 136/95  Pulse: 94 90 78 88  Resp:  '18 16 13  '$ Temp: 98.4 F (36.9 C) 98.2 F (36.8 C) 98.3 F (36.8 C) 98.4 F (36.9 C)  TempSrc: Oral Oral Oral Oral  SpO2: 99% 98% 99% 100%  Weight:      Height:        Intake/Output Summary (Last 24 hours) at 03/28/2022 1130 Last data filed at 03/28/2022 0300 Gross per 24 hour  Intake 1736.84 ml  Output 1700 ml  Net 36.84 ml   Filed Weights   03/26/22 1339 03/26/22 2118  Weight: 68 kg  69.2 kg   Examination:  General exam: NG tube in place.  Appears calm and comfortable  Respiratory system: Clear to auscultation. Respiratory effort normal. Cardiovascular system: normal S1 & S2 heard. No JVD, murmurs, rubs, gallops or clicks. No pedal edema. Gastrointestinal system: Abdomen is nondistended, soft and nontender. No organomegaly or masses felt. Normal bowel sounds heard. Central nervous system: Alert and oriented. No focal neurological deficits. Extremities: Symmetric 5 x 5 power. Skin: No rashes, lesions or ulcers. Psychiatry: Judgement and insight appear normal. Mood & affect appropriate.   Data Reviewed: I have personally reviewed following labs and imaging studies  CBC: Recent Labs  Lab 03/26/22 1453 03/27/22 0548 03/28/22 0418  WBC 6.7 5.6 6.7  NEUTROABS 4.0 3.4  --   HGB 12.7* 12.3* 11.8*  HCT 37.9* 37.3* 36.4*  MCV 94.5 95.6 97.1  PLT 367 317 161    Basic Metabolic Panel: Recent Labs  Lab 03/26/22 1453 03/27/22 0548 03/28/22 0418  NA 133* 136 142  K 3.1* 3.6 4.2  CL 91* 98 107  CO2 '31 28 26  '$ GLUCOSE 115* 105* 108*  BUN 27* 22 21  CREATININE 1.14 1.00 1.04  CALCIUM 8.1* 7.7* 8.0*  MG  --  1.8 1.8    CBG: Recent Labs  Lab 03/27/22 1959 03/28/22 0003 03/28/22 0515 03/28/22 0728 03/28/22 1118  GLUCAP 106* 112* 116* 117* 99    Recent Results (from the past 240 hour(s))  Urine Culture     Status: None   Collection Time: 03/26/22  5:48 PM   Specimen: Urine, Clean Catch  Result Value Ref Range Status   Specimen Description   Final    URINE, CLEAN CATCH Performed at [facility], [address], [place], [place] [address]    Special Requests   Final    NONE Performed at [facility], [address], [place], [place] [address]    Culture   Final    NO GROWTH Performed at [facility], [place] [address] [place], [place] [address]    Report Status 03/28/2022 FINAL  Final     Radiology Studies: DG Chest Portable 1 View  Result  Date: 03/26/2022 CLINICAL DATA:  NG tube placement. EXAM: PORTABLE CHEST 1 VIEW COMPARISON:  03/04/2022. FINDINGS: The heart size and mediastinal contours are stable. There is atherosclerotic calcification of the aorta. The visualized lungs are clear. No effusion is seen. A stable right chest port terminates over the superior vena cava. An enteric tube courses over the stomach and out of the field of view. IMPRESSION: 1. No active disease. 2. An enteric tube courses over the stomach and out of the field of view. Electronically Signed   By: Brett Fairy M.D.   On: 03/26/2022 20:06  CT ABDOMEN PELVIS W CONTRAST  Result Date: 03/26/2022 CLINICAL DATA:  Pancreatic cancer, biliary stent. Nausea, vomiting, and abdominal pain. Weight loss. * Tracking Code: BO * EXAM: CT ABDOMEN AND PELVIS WITH CONTRAST TECHNIQUE: Multidetector CT imaging of the abdomen and pelvis was performed using the standard protocol following bolus administration of intravenous contrast. RADIATION DOSE REDUCTION: This exam was performed according to the departmental dose-optimization program which includes automated exposure control, adjustment of the mA and/or kV according to patient size and/or use of iterative reconstruction technique. CONTRAST:  132m OMNIPAQUE IOHEXOL 300 MG/ML  SOLN COMPARISON:  PET-CT 02/21/2022 and CT abdomen 01/29/2022 FINDINGS: Lower chest: Unremarkable Hepatobiliary: Percutaneous biliary drain in place with loop formed in the right bile duct but near the confluence of the biliary tree. This is also close to the proximal portion of a biliary stent. There is still moderate intrahepatic biliary dilatation, although improved from 01/29/2022. I do not see a well-defined liver lesion corresponding with the focus of accentuated metabolic activity shown on prior PET-CT. Highly indistinct porta hepatis regions with ill definition of tissue planes. Mild perihepatic ascites. Pancreas: Dilated dorsal pancreatic duct extending  into an indistinctly marginated pancreatic head lesion presumably reflecting the underlying tumor, with ill-defined hypodensity in this region measuring about 3.5 by 1.9 cm on image 24 of series 2, and abutting the margin of the SMA although not completely wrapping around the SMA. The prior lesser sac fluid collection shown on 01/29/2022 is no longer present but there are bandlike cords of soft tissue density below the pancreatic body as on image 39 of series 5 which could be due to local tumor extension, inflammation, or less likely infection. Spleen: Unremarkable Adrenals/Urinary Tract: Stable small renal cyst do not require individual follow up. Urinary bladder unremarkable. No hydronephrosis. Adrenal glands unremarkable. Stomach/Bowel: Notably distended stomach. Narrowed gastrojejunostomy on images 32 through 37 of series 2, significance uncertain but relative outlet obstruction along the gastrojejunostomy cannot be excluded. Ill definition of the small bowel receiving pancreatic and biliary drainage. Borderline prominence of stool in the colon. A loop of bowel containing an air-fluid level extends anterior to the left hepatic lobe on image 21 series 2, this is more cephalad than prior extension of bowel. Vascular/Lymphatic: Atherosclerosis is present, including aortoiliac atherosclerotic disease. Narrowing of the proximal celiac trunk possibly due to median arcuate ligament and atheromatous vascular disease. There is also mild atherosclerotic narrowing of the proximal SMA. As noted above, indistinct hypodense soft tissue abuts the right margin of the SMA on image 24 series 2 may represent tumor. Collateral vessels extend around the stomach. Reproductive: Mild prostatomegaly. Other: There is some mild hazy stranding in central mesentery more notable indistinct stranding along the porta hepatis. Trace perihepatic ascites. Musculoskeletal: Bridging spurring of both sacroiliac joints. Lower lumbar degenerative  facet arthropathy. Lumbar impingement at L4-5 due to spondylosis and degenerative disc disease. IMPRESSION: 1. Notably distended stomach with narrowed/angulated appearance of the gastrojejunostomy, significance uncertain but relative outlet obstruction along the gastrojejunostomy cannot be excluded. 2. Ill definition of the porta hepatis regions with ill definition of tissue planes. Indistinct hypodense soft tissue in the region of the pancreatic head and abutting the right margin of the SMA, compatible tumor. 3. Prior lesser sac fluid collection is no longer present but there are bandlike cords of soft tissue density below the pancreatic body which could be due to local tumor extension, inflammation, or less likely infection. 4. Percutaneous biliary drain in place with loop formed in the right bile duct  but near the confluence of the biliary tree. Adjacent stent in the common hepatic duct. There is still moderate intrahepatic biliary dilatation, although improved from 01/29/2022. 5. Mild perihepatic ascites. 6. Narrowing of the proximal celiac trunk and proximal SMA due to median arcuate ligament and atheromatous vascular disease. 7. Lumbar impingement at L4-5 due to spondylosis and degenerative disc disease. 8. Mild prostatomegaly. 9. Borderline prominence of stool in the colon. 10. Aortic atherosclerosis. Aortic Atherosclerosis (ICD10-I70.0). Electronically Signed   By: Van Clines M.D.   On: 03/26/2022 17:22    Scheduled Meds:  Chlorhexidine Gluconate Cloth  6 each Topical Daily   levothyroxine  175 mcg Oral QAC breakfast   lipase/protease/amylase  36,000 Units Oral TID WC   nicotine  21 mg Transdermal Daily   Continuous Infusions:  0.9 % NaCl with KCl 40 mEq / L 100 mL/hr at 03/27/22 1950     LOS: 2 days   Time spent: 34 mins  Eliz Nigg Wynetta Emery, MD How to contact the [facility] Attending or Consulting provider [place] or covering provider during after hours [place], for this patient?  Check the  care team in [facility] and look for a) attending/consulting [facility] provider listed and b) the [facility] team listed Log into [email or web address] and use [place]'s universal password to access. If you do not have the password, please contact the hospital operator. Locate the [facility] provider you are looking for under [facility] and page to a number that you can be directly reached. If you still have difficulty reaching the provider, please page the [facility] (Director on Call) for the Hospitalists listed on amion for assistance.  03/28/2022, 11:30 AM

## 2022-03-28 NOTE — Assessment & Plan Note (Signed)
--   will offer nightly CPAP

## 2022-03-28 NOTE — Progress Notes (Signed)
Gastroenterology Progress Note   Referring Provider: No ref. provider found Primary Care Physician:  [name], MD Primary Gastroenterologist:  previously Dr. [name]  Patient ID: Alexander Banks; [id number]; 1955/06/06   Subjective:    Tolerating NGT. No complaints this morning. No BMs.   Objective:   Vital signs in last 24 hours: Temp:  [98.2 F (36.8 C)-98.4 F (36.9 C)] 98.4 F (36.9 C) (11/09 0900) Pulse Rate:  [78-94] 88 (11/09 0900) Resp:  [13-18] 13 (11/09 0900) BP: (124-136)/(80-95) 136/95 (11/09 0900) SpO2:  [98 %-100 %] 100 % (11/09 0900) Last BM Date : 03/26/22 General:   Alert,  thin, pleasant and cooperative in NAD. Ngt with bilious drainage. Head:  Normocephalic and atraumatic. Eyes:  Sclera clear, no icterus.  Abdomen:    nondistended.     Extremities:  Without clubbing, deformity or edema. Neurologic:  Alert and  oriented x4;  grossly normal neurologically. Psych:  Alert and cooperative. Normal mood and affect.  Intake/Output from previous day: 11/08 0701 - 11/09 0700 In: 1736.8 [I.V.:1736.8] Out: 1700 [Urine:200; Emesis/NG output:1500] Intake/Output this shift: No intake/output data recorded.  Lab Results: CBC Recent Labs    03/26/22 1453 03/27/22 0548 03/28/22 0418  WBC 6.7 5.6 6.7  HGB 12.7* 12.3* 11.8*  HCT 37.9* 37.3* 36.4*  MCV 94.5 95.6 97.1  PLT 367 317 312   BMET Recent Labs    03/26/22 1453 03/27/22 0548 03/28/22 0418  NA 133* 136 142  K 3.1* 3.6 4.2  CL 91* 98 107  CO2 '31 28 26  '$ GLUCOSE 115* 105* 108*  BUN 27* 22 21  CREATININE 1.14 1.00 1.04  CALCIUM 8.1* 7.7* 8.0*   LFTs Recent Labs    03/26/22 1453 03/27/22 0548  BILITOT 1.7* 1.6*  ALKPHOS 121 100  AST 37 32  ALT 34 31  PROT 6.4* 5.4*  ALBUMIN 3.1* 2.5*   Recent Labs    03/26/22 1453  LIPASE 20   PT/INR No results for input(s): "LABPROT", "INR" in the last 72 hours.       Imaging Studies: DG Chest Portable 1 View  Result Date:  03/26/2022 CLINICAL DATA:  NG tube placement. EXAM: PORTABLE CHEST 1 VIEW COMPARISON:  03/04/2022. FINDINGS: The heart size and mediastinal contours are stable. There is atherosclerotic calcification of the aorta. The visualized lungs are clear. No effusion is seen. A stable right chest port terminates over the superior vena cava. An enteric tube courses over the stomach and out of the field of view. IMPRESSION: 1. No active disease. 2. An enteric tube courses over the stomach and out of the field of view. Electronically Signed   By: Brett Fairy M.D.   On: 03/26/2022 20:06   CT ABDOMEN PELVIS W CONTRAST  Result Date: 03/26/2022 CLINICAL DATA:  Pancreatic cancer, biliary stent. Nausea, vomiting, and abdominal pain. Weight loss. * Tracking Code: BO * EXAM: CT ABDOMEN AND PELVIS WITH CONTRAST TECHNIQUE: Multidetector CT imaging of the abdomen and pelvis was performed using the standard protocol following bolus administration of intravenous contrast. RADIATION DOSE REDUCTION: This exam was performed according to the departmental dose-optimization program which includes automated exposure control, adjustment of the mA and/or kV according to patient size and/or use of iterative reconstruction technique. CONTRAST:  162m OMNIPAQUE IOHEXOL 300 MG/ML  SOLN COMPARISON:  PET-CT 02/21/2022 and CT abdomen 01/29/2022 FINDINGS: Lower chest: Unremarkable Hepatobiliary: Percutaneous biliary drain in place with loop formed in the right bile duct but near the confluence of the biliary  tree. This is also close to the proximal portion of a biliary stent. There is still moderate intrahepatic biliary dilatation, although improved from 01/29/2022. I do not see a well-defined liver lesion corresponding with the focus of accentuated metabolic activity shown on prior PET-CT. Highly indistinct porta hepatis regions with ill definition of tissue planes. Mild perihepatic ascites. Pancreas: Dilated dorsal pancreatic duct extending into an  indistinctly marginated pancreatic head lesion presumably reflecting the underlying tumor, with ill-defined hypodensity in this region measuring about 3.5 by 1.9 cm on image 24 of series 2, and abutting the margin of the SMA although not completely wrapping around the SMA. The prior lesser sac fluid collection shown on 01/29/2022 is no longer present but there are bandlike cords of soft tissue density below the pancreatic body as on image 39 of series 5 which could be due to local tumor extension, inflammation, or less likely infection. Spleen: Unremarkable Adrenals/Urinary Tract: Stable small renal cyst do not require individual follow up. Urinary bladder unremarkable. No hydronephrosis. Adrenal glands unremarkable. Stomach/Bowel: Notably distended stomach. Narrowed gastrojejunostomy on images 32 through 37 of series 2, significance uncertain but relative outlet obstruction along the gastrojejunostomy cannot be excluded. Ill definition of the small bowel receiving pancreatic and biliary drainage. Borderline prominence of stool in the colon. A loop of bowel containing an air-fluid level extends anterior to the left hepatic lobe on image 21 series 2, this is more cephalad than prior extension of bowel. Vascular/Lymphatic: Atherosclerosis is present, including aortoiliac atherosclerotic disease. Narrowing of the proximal celiac trunk possibly due to median arcuate ligament and atheromatous vascular disease. There is also mild atherosclerotic narrowing of the proximal SMA. As noted above, indistinct hypodense soft tissue abuts the right margin of the SMA on image 24 series 2 may represent tumor. Collateral vessels extend around the stomach. Reproductive: Mild prostatomegaly. Other: There is some mild hazy stranding in central mesentery more notable indistinct stranding along the porta hepatis. Trace perihepatic ascites. Musculoskeletal: Bridging spurring of both sacroiliac joints. Lower lumbar degenerative facet  arthropathy. Lumbar impingement at L4-5 due to spondylosis and degenerative disc disease. IMPRESSION: 1. Notably distended stomach with narrowed/angulated appearance of the gastrojejunostomy, significance uncertain but relative outlet obstruction along the gastrojejunostomy cannot be excluded. 2. Ill definition of the porta hepatis regions with ill definition of tissue planes. Indistinct hypodense soft tissue in the region of the pancreatic head and abutting the right margin of the SMA, compatible tumor. 3. Prior lesser sac fluid collection is no longer present but there are bandlike cords of soft tissue density below the pancreatic body which could be due to local tumor extension, inflammation, or less likely infection. 4. Percutaneous biliary drain in place with loop formed in the right bile duct but near the confluence of the biliary tree. Adjacent stent in the common hepatic duct. There is still moderate intrahepatic biliary dilatation, although improved from 01/29/2022. 5. Mild perihepatic ascites. 6. Narrowing of the proximal celiac trunk and proximal SMA due to median arcuate ligament and atheromatous vascular disease. 7. Lumbar impingement at L4-5 due to spondylosis and degenerative disc disease. 8. Mild prostatomegaly. 9. Borderline prominence of stool in the colon. 10. Aortic atherosclerosis. Aortic Atherosclerosis (ICD10-I70.0). Electronically Signed   By: Van Clines M.D.   On: 03/26/2022 17:22   IR BILIARY STENT(S) EXISTING ACCESS INC DILATION CATH EXCHANGE  Result Date: 03/15/2022 INDICATION: 66 year old male presents for possible treatment stricture at choledocho jejunostomy, with a prior internal external biliary drain. EXAM: CHOLANGIOGRAM STENTING AND ANGIOPLASTY OF STRICTURE  AT CHOLEDOCHO JEJUNOSTOMY DRAIN EXCHANGE MEDICATIONS: 2 g Rocephin; The antibiotic was administered within an appropriate time frame prior to the initiation of the procedure. ANESTHESIA/SEDATION: Moderate  (conscious) sedation was employed during this procedure. A total of Versed 2.0 mg and Fentanyl 100 mcg was administered intravenously by the radiology nurse. Total intra-service moderate Sedation Time: 22 minutes. The patient's level of consciousness and vital signs were monitored continuously by radiology nursing throughout the procedure under my direct supervision. FLUOROSCOPY: Radiation Exposure Index (as provided by the fluoroscopic device): 41 mGy Kerma COMPLICATIONS: None PROCEDURE: Informed written consent was obtained from the patient after a thorough discussion of the procedural risks, benefits and alternatives. All questions were addressed. Maximal Sterile Barrier Technique was utilized including caps, mask, sterile gowns, sterile gloves, sterile drape, hand hygiene and skin antiseptic. A timeout was performed prior to the initiation of the procedure. Patient was positioned supine position on the image intensifier table. The right upper quadrant cleaning the indwelling drain were prepped and draped in the usual sterile fashion. 1% lidocaine was used for local anesthesia. Contrast was injected through the indwelling 10 French drain confirming location within the small bowel. Bentson wire was advanced through the drain and the drain was retracted. A 10 French 35 cm sheath was advanced on the Bentson wire into the small bowel. The introducer was withdrawn and then a pull-back cholangiogram was performed. Measurement was made and we elected to place an 8 mm x 40 mm uncovered biliary stent. The introducer was advanced through the sheath which was then placed into the small bowel. Introducer was removed and then we deployed the 8 mm x 40 mm biliary stent. Balloon angioplasty was performed 8 mm x 40 mm until the waist resolved and nominal diameter was achieved. Final image was recorded. The sheath was then removed and a 10 [place] externalized pigtail drainage catheter was placed. The drain was capped and sutured  at the skin. Final image was acquired. Patient tolerated the procedure well and remained hemodynamically stable throughout. No complications were encountered and no significant blood loss. IMPRESSION: Status post image guided stenting and angioplasty of stricture at the choledocho jejunostomy, with biliary drain exchange for externalized pigtail drain. Signed, Dulcy Fanny. Dellia Nims, ABVM, RPVI [facility] Radiology PLAN: The patient will return in 1-2 weeks for reassessment, lab values, and possible removal of the externalized drain. Electronically Signed   By: Corrie Mckusick D.O.   On: 03/15/2022 17:42   IR PATIENT EVAL TECH 0-60 MINS  Result Date: 03/12/2022 INDICATION: 66 year old gentleman with history of biliary obstruction due to pancreatic malignancy was treated with percutaneous biliary drain placement on 02/04/2022. He returns with leakage around the catheter entry site. EXAM: Evaluation of internal-external biliary drain to physical examination. MEDICATIONS: None ANESTHESIA/SEDATION: None COMPLICATIONS: None immediate. PROCEDURE: Patient was evaluated at bedside. There was significant leakage around the catheter entry site. The drain did not appear significantly retracted. The drain was attached to bag and allowed to evacuate the bile for several minutes. The patient was then observed for 15 additional minutes and no leakage was seen around the catheter entry site. IMPRESSION: 66 year old gentleman with history of pancreatic malignancy resulting in biliary obstruction, treated by percutaneous internal-external biliary drain placement returned to IR with increased leakage. Once the drain was reattached to bag, the leaking stopped. PLAN: Patient scheduled for routine exchange on 04/01/2022. Electronically Signed   By: Miachel Roux M.D.   On: 03/12/2022 09:46   DG CHEST PORT  1 VIEW  Result Date: 03/04/2022 CLINICAL DATA:  Hypotension EXAM: PORTABLE CHEST  1 VIEW COMPARISON:  Radiographs 12/11/2021 FINDINGS: No focal consolidation, pleural effusion, or pneumothorax. Normal cardiomediastinal silhouette. No acute osseous abnormality. Remote left rib fracture. Accessed right chest wall Port-A-Cath with tip in the mid SVC. IMPRESSION: No active disease. Electronically Signed   By: Placido Sou M.D.   On: 03/04/2022 19:30  [2 weeks]  Assessment:   Alexander Banks is a 66 y.o. year old male with medical history of IDA, DM type 2, HLD, HTN, pancreatic adenocarcinoma s/p partial whipple (04/2021) and chemotherapy with local recurrence who has undergone internal/external biliary drainage catheter placement (02/04/22) as well as biliary stenting and angioplasty of stricture at the choledochojejunostomy(03/15/22) who presented to the ED yesterday with c/o abdominal and back pain x several weeks. PO Intake of food became intolerable, one episode of vomiting. General surgery consulted initially due to abnormal CT findings, however, they requested GI evaluation for possible GOO.   Admission CT demonstrated a markedly dilated gastric cavity.  There is concern for gastric outlet obstruction.  Not clear from imaging whether this is due to inflammation, neoplasm or postoperative stricturing. Await EGD, plans for tomorrow, waiting for Eliquis washout.   Plan:   EGD tomorrow.  I have discussed the risks, alternatives, benefits with regards to but not limited to the risk of reaction to medication, bleeding, infection, perforation and the patient is agreeable to proceed. Written consent to be obtained. Hold eliquis. Continue creon when eating.    LOS: 2 days   Laureen Ochs. Bernarda Caffey [facility] [phone] 11/9/20239:50 AM

## 2022-03-28 NOTE — Assessment & Plan Note (Addendum)
--   s/p NG placement with good output reported -- NG discontinued on 11/10;  Tolerating full liquid diet.   Continue full liquid diet until follow up with surgeon in [employer]  -- GI service planned EGD on 11/10 giving time for apixaban washout -- EGD: The examined esophagus was normal.      A medium amount of food (residue) was found in the gastric body.      Localized mucosal changes characterized by congestion were found in the       jejunum, with associated tight lumen, no stricture. Possible external       comrpession. Biopsies were taken with a cold forceps for histology.

## 2022-03-28 NOTE — Hospital Course (Signed)
66 y.o. male with medical history significant of iron deficiency anemia, diabetes mellitus type 2, hyperlipidemia, hypertension, prediabetes, and more presents to the ED with a chief complaint of abdominal and back pain.  Patient reports that started several weeks ago.  Its been worse since it started.  He just cannot take the pain anymore.  He reports the pain in his abdomen feels like a pressure.  It is constant.  Oxycodone helped it minimally.  He has had nausea and vomiting x1 that is nonbloody.  He has not had diarrhea.  Patient reports his bowel movements have been irregular for about a week but prior to that he was feeling pretty constipated.  He has had no hematochezia or melena.  Patient reports he continues remember when his last normal meal was.  He has no appetite.  The last time he had any p.o. solid food intake was 3 days ago.  In the interim he has been drinking water and Gatorade.  Patient reports the pain in his abdomen and pain is back for 2 separate pains.  The one in his abdomen feels like pressure.  The one in his back feels like a muscle spasm that is constant.  Oxycodone helps the 1 in his back slightly as well.  He has not had a fever.  He has no new pains.  He has no dysuria.  Patient reports at this time his pain is resolved and he is not sure what made it go away.  Patient denies any numbness.  He is in full control of his urine and bowels.  Patient has no other complaints at this time.

## 2022-03-28 NOTE — Assessment & Plan Note (Signed)
--   continue skin care protocol  ?

## 2022-03-28 NOTE — Progress Notes (Addendum)
Subjective: Alexander Banks had some mild mid-epigastric pain overnight and this morning. He had low back pain that required morphine yesterday. He did not sleep very well last night due to new surroundings. He reports his NG output was full and changed x1 yesterday. NG output this morning ~322m of dark liquid. He denies fevers, chills, nausea, and vomiting.  Objective: Vital signs in last 24 hours: Temp:  [98.2 F (36.8 C)-98.4 F (36.9 C)] 98.3 F (36.8 C) (11/09 0506) Pulse Rate:  [78-94] 78 (11/09 0506) Resp:  [16-18] 16 (11/09 0506) BP: (124-134)/(80-84) 134/84 (11/09 0506) SpO2:  [98 %-99 %] 99 % (11/09 0506) Last BM Date : 03/26/22  Intake/Output from previous day: 11/08 0701 - 11/09 0700 In: 1736.8 [I.V.:1736.8] Out: 1700 [Urine:200; Emesis/NG output:1500] Intake/Output this shift: No intake/output data recorded.  General appearance: alert and no distress Resp: clear to auscultation bilaterally Cardio: regular rate and rhythm, S1, S2 normal, no murmur, click, rub or gallop GI: normal findings: soft, non-tender  Lab Results:  Recent Labs    03/27/22 0548 03/28/22 0418  WBC 5.6 6.7  HGB 12.3* 11.8*  HCT 37.3* 36.4*  PLT 317 312   BMET Recent Labs    03/27/22 0548 03/28/22 0418  NA 136 142  K 3.6 4.2  CL 98 107  CO2 28 26  GLUCOSE 105* 108*  BUN 22 21  CREATININE 1.00 1.04  CALCIUM 7.7* 8.0*   PT/INR No results for input(s): "LABPROT", "INR" in the last 72 hours.  Studies/Results: DG Chest Portable 1 View  Result Date: 03/26/2022 CLINICAL DATA:  NG tube placement. EXAM: PORTABLE CHEST 1 VIEW COMPARISON:  03/04/2022. FINDINGS: The heart size and mediastinal contours are stable. There is atherosclerotic calcification of the aorta. The visualized lungs are clear. No effusion is seen. A stable right chest port terminates over the superior vena cava. An enteric tube courses over the stomach and out of the field of view. IMPRESSION: 1. No active disease.  2. An enteric tube courses over the stomach and out of the field of view. Electronically Signed   By: LBrett FairyM.D.   On: 03/26/2022 20:06   CT ABDOMEN PELVIS W CONTRAST  Result Date: 03/26/2022 CLINICAL DATA:  Pancreatic cancer, biliary stent. Nausea, vomiting, and abdominal pain. Weight loss. * Tracking Code: BO * EXAM: CT ABDOMEN AND PELVIS WITH CONTRAST TECHNIQUE: Multidetector CT imaging of the abdomen and pelvis was performed using the standard protocol following bolus administration of intravenous contrast. RADIATION DOSE REDUCTION: This exam was performed according to the departmental dose-optimization program which includes automated exposure control, adjustment of the mA and/or kV according to patient size and/or use of iterative reconstruction technique. CONTRAST:  1018mOMNIPAQUE IOHEXOL 300 MG/ML  SOLN COMPARISON:  PET-CT 02/21/2022 and CT abdomen 01/29/2022 FINDINGS: Lower chest: Unremarkable Hepatobiliary: Percutaneous biliary drain in place with loop formed in the right bile duct but near the confluence of the biliary tree. This is also close to the proximal portion of a biliary stent. There is still moderate intrahepatic biliary dilatation, although improved from 01/29/2022. I do not see a well-defined liver lesion corresponding with the focus of accentuated metabolic activity shown on prior PET-CT. Highly indistinct porta hepatis regions with ill definition of tissue planes. Mild perihepatic ascites. Pancreas: Dilated dorsal pancreatic duct extending into an indistinctly marginated pancreatic head lesion presumably reflecting the underlying tumor, with ill-defined hypodensity in this region measuring about 3.5 by 1.9 cm on image 24 of series 2, and abutting the margin  of the SMA although not completely wrapping around the SMA. The prior lesser sac fluid collection shown on 01/29/2022 is no longer present but there are bandlike cords of soft tissue density below the pancreatic body as on  image 39 of series 5 which could be due to local tumor extension, inflammation, or less likely infection. Spleen: Unremarkable Adrenals/Urinary Tract: Stable small renal cyst do not require individual follow up. Urinary bladder unremarkable. No hydronephrosis. Adrenal glands unremarkable. Stomach/Bowel: Notably distended stomach. Narrowed gastrojejunostomy on images 32 through 37 of series 2, significance uncertain but relative outlet obstruction along the gastrojejunostomy cannot be excluded. Ill definition of the small bowel receiving pancreatic and biliary drainage. Borderline prominence of stool in the colon. A loop of bowel containing an air-fluid level extends anterior to the left hepatic lobe on image 21 series 2, this is more cephalad than prior extension of bowel. Vascular/Lymphatic: Atherosclerosis is present, including aortoiliac atherosclerotic disease. Narrowing of the proximal celiac trunk possibly due to median arcuate ligament and atheromatous vascular disease. There is also mild atherosclerotic narrowing of the proximal SMA. As noted above, indistinct hypodense soft tissue abuts the right margin of the SMA on image 24 series 2 may represent tumor. Collateral vessels extend around the stomach. Reproductive: Mild prostatomegaly. Other: There is some mild hazy stranding in central mesentery more notable indistinct stranding along the porta hepatis. Trace perihepatic ascites. Musculoskeletal: Bridging spurring of both sacroiliac joints. Lower lumbar degenerative facet arthropathy. Lumbar impingement at L4-5 due to spondylosis and degenerative disc disease. IMPRESSION: 1. Notably distended stomach with narrowed/angulated appearance of the gastrojejunostomy, significance uncertain but relative outlet obstruction along the gastrojejunostomy cannot be excluded. 2. Ill definition of the porta hepatis regions with ill definition of tissue planes. Indistinct hypodense soft tissue in the region of the  pancreatic head and abutting the right margin of the SMA, compatible tumor. 3. Prior lesser sac fluid collection is no longer present but there are bandlike cords of soft tissue density below the pancreatic body which could be due to local tumor extension, inflammation, or less likely infection. 4. Percutaneous biliary drain in place with loop formed in the right bile duct but near the confluence of the biliary tree. Adjacent stent in the common hepatic duct. There is still moderate intrahepatic biliary dilatation, although improved from 01/29/2022. 5. Mild perihepatic ascites. 6. Narrowing of the proximal celiac trunk and proximal SMA due to median arcuate ligament and atheromatous vascular disease. 7. Lumbar impingement at L4-5 due to spondylosis and degenerative disc disease. 8. Mild prostatomegaly. 9. Borderline prominence of stool in the colon. 10. Aortic atherosclerosis. Aortic Atherosclerosis (ICD10-I70.0). Electronically Signed   By: Van Clines M.D.   On: 03/26/2022 17:22    Anti-infectives: Anti-infectives (From admission, onward)    None       Assessment/Plan: Gastric outlet obstruction -GI to perform EGD tomorrow -Continue to hold Eliquis -Continue NG tube -Continue PRN pain and nausea medicines -Keep NPO   LOS: 2 days    Leonette Nutting 03/28/2022

## 2022-03-28 NOTE — Progress Notes (Signed)
Patient has NG tube in place at this time.  Patient was asleep when I went in to talk to him about what he uses at home; saw his NG tube, and spoke with RN instead.  RN will let him know that CPAP can resume when his NG has been removed.

## 2022-03-29 ENCOUNTER — Encounter (HOSPITAL_COMMUNITY): Payer: Self-pay | Admitting: Family Medicine

## 2022-03-29 ENCOUNTER — Other Ambulatory Visit: Payer: Self-pay

## 2022-03-29 ENCOUNTER — Inpatient Hospital Stay (HOSPITAL_COMMUNITY): Payer: BC Managed Care – PPO | Admitting: Anesthesiology

## 2022-03-29 ENCOUNTER — Encounter (HOSPITAL_COMMUNITY)
Admission: EM | Disposition: A | Discharge status: Home / Self Care | Payer: Self-pay | Source: Home / Self Care | Attending: Family Medicine

## 2022-03-29 DIAGNOSIS — E43 Unspecified severe protein-calorie malnutrition: Secondary | ICD-10-CM | POA: Diagnosis not present

## 2022-03-29 DIAGNOSIS — K311 Adult hypertrophic pyloric stenosis: Secondary | ICD-10-CM | POA: Diagnosis not present

## 2022-03-29 DIAGNOSIS — G4733 Obstructive sleep apnea (adult) (pediatric): Secondary | ICD-10-CM | POA: Diagnosis not present

## 2022-03-29 DIAGNOSIS — K6389 Other specified diseases of intestine: Secondary | ICD-10-CM

## 2022-03-29 DIAGNOSIS — D6869 Other thrombophilia: Secondary | ICD-10-CM | POA: Diagnosis not present

## 2022-03-29 HISTORY — PX: BIOPSY: SHX5522

## 2022-03-29 HISTORY — PX: ESOPHAGOGASTRODUODENOSCOPY (EGD) WITH PROPOFOL: SHX5813

## 2022-03-29 LAB — BASIC METABOLIC PANEL
Anion gap: 6 (ref 5–15)
BUN: 16 mg/dL (ref 8–23)
CO2: 24 mmol/L (ref 22–32)
Calcium: 7.9 mg/dL — ABNORMAL LOW (ref 8.9–10.3)
Chloride: 109 mmol/L (ref 98–111)
Creatinine, Ser: 0.84 mg/dL (ref 0.61–1.24)
GFR, Estimated: >60 mL/min (ref >60)
Glucose, Bld: 139 mg/dL — ABNORMAL HIGH (ref 70–99)
Potassium: 4.1 mmol/L (ref 3.5–5.1)
Sodium: 139 mmol/L (ref 135–145)

## 2022-03-29 LAB — GLUCOSE, CAPILLARY
Glucose-Capillary: 139 mg/dL — ABNORMAL HIGH (ref 70–99)
Glucose-Capillary: 148 mg/dL — ABNORMAL HIGH (ref 70–99)
Glucose-Capillary: 100 mg/dL — ABNORMAL HIGH (ref 70–99)

## 2022-03-29 LAB — MAGNESIUM: Magnesium: 1.8 mg/dL (ref 1.7–2.4)

## 2022-03-29 SURGERY — ESOPHAGOGASTRODUODENOSCOPY (EGD) WITH PROPOFOL
Anesthesia: General

## 2022-03-29 MED ORDER — SODIUM CHLORIDE 0.9 % IV SOLN: INTRAVENOUS | Status: DC

## 2022-03-29 MED ORDER — METOCLOPRAMIDE HCL 5 MG/ML IJ SOLN
5.0000 mg | Freq: Four times a day (QID) | INTRAMUSCULAR | Status: DC
  Administered 2022-03-29 – 2022-03-30 (×4): 5 mg via INTRAVENOUS
  Filled 2022-03-29 (×3): qty 2

## 2022-03-29 MED ORDER — SUCCINYLCHOLINE CHLORIDE 200 MG/10ML IV SOSY
PREFILLED_SYRINGE | INTRAVENOUS | Status: DC | PRN
  Administered 2022-03-29: 140 mg via INTRAVENOUS

## 2022-03-29 MED ORDER — LIDOCAINE HCL (CARDIAC) PF 100 MG/5ML IV SOSY
PREFILLED_SYRINGE | INTRAVENOUS | Status: DC | PRN
  Administered 2022-03-29: 50 mg via INTRATRACHEAL

## 2022-03-29 MED ORDER — METOCLOPRAMIDE HCL 5 MG/ML IJ SOLN
INTRAMUSCULAR | Status: AC
  Filled 2022-03-29: qty 2

## 2022-03-29 MED ORDER — PROPOFOL 10 MG/ML IV BOLUS
INTRAVENOUS | Status: DC | PRN
  Administered 2022-03-29: 130 mg via INTRAVENOUS

## 2022-03-29 MED ORDER — LACTATED RINGERS IV SOLN: INTRAVENOUS | Status: DC

## 2022-03-29 MED ORDER — ONDANSETRON HCL 4 MG/2ML IJ SOLN
INTRAMUSCULAR | Status: DC | PRN
  Administered 2022-03-29: 4 mg via INTRAVENOUS

## 2022-03-29 NOTE — Anesthesia Procedure Notes (Signed)
Procedure Name: Intubation Date/Time: 03/29/2022 1:52 PM  Performed by: Karna Dupes, CRNAPre-anesthesia Checklist: Patient identified, Emergency Drugs available, Suction available and Patient being monitored Patient Re-evaluated:Patient Re-evaluated prior to induction Oxygen Delivery Method: Circle system utilized Preoxygenation: Pre-oxygenation with 100% oxygen Induction Type: IV induction, Cricoid Pressure applied and Rapid sequence Ventilation: Mask ventilation without difficulty Laryngoscope Size: Mac and 4 Grade View: Grade II Tube type: Oral Tube size: 7.5 mm Number of attempts: 1 Airway Equipment and Method: Stylet Placement Confirmation: ETT inserted through vocal cords under direct vision, positive ETCO2 and breath sounds checked- equal and bilateral Secured at: 21 cm Tube secured with: Tape Dental Injury: Teeth and Oropharynx as per pre-operative assessment

## 2022-03-29 NOTE — Transfer of Care (Signed)
Immediate Anesthesia Transfer of Care Note  Patient: Alexander Banks  Procedure(s) Performed: ESOPHAGOGASTRODUODENOSCOPY (EGD) WITH PROPOFOL BIOPSY  Patient Location: PACU  Anesthesia Type:General  Level of Consciousness: awake  Airway & Oxygen Therapy: Patient Spontanous Breathing and Patient connected to face mask oxygen  Post-op Assessment: Report given to RN and Post -op Vital signs reviewed and stable  Post vital signs: Reviewed and stable  Last Vitals:  Vitals Value Taken Time  BP 123/87 03/29/22 1422  Temp 97.1   Pulse 81 03/29/22 1423  Resp 14   SpO2 100 % 03/29/22 1423  Vitals shown include unvalidated device data.  Last Pain:  Vitals:   03/29/22 1349  TempSrc:   PainSc: 5       Patients Stated Pain Goal: 4 (99/14/44 5848)  Complications: No notable events documented.

## 2022-03-29 NOTE — Progress Notes (Signed)
PROGRESS NOTE   Alexander Banks  UXN:[id number] DOB: 1956-01-24 DOA: 03/26/2022 PCP: [name], MD   Chief Complaint  Patient presents with   Abdominal Pain   Level of care: Med-Surg  Brief Admission History:  66 y.o. male with medical history significant of iron deficiency anemia, diabetes mellitus type 2, hyperlipidemia, hypertension, prediabetes, and more presents to the ED with a chief complaint of abdominal and back pain.  Patient reports that started several weeks ago.  Its been worse since it started.  He just cannot take the pain anymore.  He reports the pain in his abdomen feels like a pressure.  It is constant.  Oxycodone helped it minimally.  He has had nausea and vomiting x1 that is nonbloody.  He has not had diarrhea.  Patient reports his bowel movements have been irregular for about a week but prior to that he was feeling pretty constipated.  He has had no hematochezia or melena.  Patient reports he continues remember when his last normal meal was.  He has no appetite.  The last time he had any p.o. solid food intake was 3 days ago.  In the interim he has been drinking water and Gatorade.  Patient reports the pain in his abdomen and pain is back for 2 separate pains.  The one in his abdomen feels like pressure.  The one in his back feels like a muscle spasm that is constant.  Oxycodone helps the 1 in his back slightly as well.  He has not had a fever.  He has no new pains.  He has no dysuria.  Patient reports at this time his pain is resolved and he is not sure what made it go away.  Patient denies any numbness.  He is in full control of his urine and bowels.  Patient has no other complaints at this time.     Assessment and Plan: * Gastric outlet obstruction -- s/p NG placement with good output reported -- continue NPO status  -- GI service planning EGD on 11/10 giving time for apixaban washout  Pressure injury of skin -- continue skin care protocol   Tobacco use  disorder - Patient is a current smoker. - Nicotine patch ordered - Encouraged regarding cessation  Hypokalemia - this has been repleted    Abdominal pain - CT abdomen shows a distended stomach with narrowed appearance of gastrojejunostomy.  Extension of known tumor.  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 again  Obstructive sleep apnea -- will offer nightly CPAP   DVT prophylaxis: SCD Code Status: Full  Family Communication: wife 11/8 Disposition: Status is: Inpatient Remains inpatient appropriate because: IV treatments    Consultants:  Surgery GI  Procedures:   Antimicrobials:    Subjective: Pt irritated by NG tube.  He is agreeable to EGD today.   He has been off  apixaban for 48 hours.  Abd pain is improved since NG was placed.     Objective: Vitals:   03/28/22 1100 03/28/22 2044 03/29/22 0551 03/29/22 1100  BP: (!) 140/88 124/86 136/89 122/83  Pulse: 87 81 81 88  Resp: '14 18 18 18  '$ Temp: 98.1 F (36.7 C) 98.4 F (36.9 C) (!) 97.4 F (36.3 C) 97.8 F (36.6 C)  TempSrc: Oral Oral Oral Oral  SpO2: 100% 100% 100% 100%  Weight:      Height:        Intake/Output Summary (Last 24 hours) at 03/29/2022 1211 Last data filed at 03/29/2022 1000 Gross per 24 hour  Intake 956.01 ml  Output 3200 ml  Net -2243.99 ml   Filed Weights    03/26/22 1339 03/26/22 2118  Weight: 68 kg 69.2 kg   Examination:  General exam: NG tube in place.  Appears calm and comfortable  Respiratory system: Clear to auscultation. Respiratory effort normal. Cardiovascular system: normal S1 & S2 heard. No JVD, murmurs, rubs, gallops or clicks. No pedal edema. Gastrointestinal system: Abdomen is nondistended, soft and nontender. No organomegaly or masses felt. Normal bowel sounds heard. Central nervous system: Alert and oriented. No focal neurological deficits. Extremities: Symmetric 5 x 5 power. Skin: No rashes, lesions or ulcers. Psychiatry: Judgement and insight appear normal. Mood & affect appropriate.   Data Reviewed: I have personally reviewed following labs and imaging studies  CBC: Recent Labs  Lab 03/26/22 1453 03/27/22 0548 03/28/22 0418  WBC 6.7 5.6 6.7  NEUTROABS 4.0 3.4  --   HGB 12.7* 12.3* 11.8*  HCT 37.9* 37.3* 36.4*  MCV 94.5 95.6 97.1  PLT 367 317 161    Basic Metabolic Panel: Recent Labs  Lab 03/26/22 1453 03/27/22 0548 03/28/22 0418 03/29/22 0336  NA 133* 136 142 139  K 3.1* 3.6 4.2 4.1  CL 91* 98 107 109  CO2 '31 28 26 24  '$ GLUCOSE 115* 105* 108* 139*  BUN 27* '22 21 16  '$ CREATININE 1.14 1.00 1.04 0.84  CALCIUM 8.1* 7.7* 8.0* 7.9*  MG  --  1.8 1.8 1.8    CBG: Recent Labs  Lab 03/28/22 1118 03/28/22 1646 03/28/22 2343 03/29/22 0544 03/29/22 0719  GLUCAP 99 94 130* 139* 148*    Recent Results (from the past 240 hour(s))  Urine Culture     Status: None   Collection Time: 03/26/22  5:48 PM   Specimen: Urine, Clean Catch  Result Value Ref Range Status   Specimen Description   Final    URINE, CLEAN CATCH Performed at [facility], [address] [place], [place] [address]    Special Requests   Final    NONE Performed at [facility], [address] [place], [place] [address]    Culture   Final    NO GROWTH Performed at [facility], [place] [address] [place], [place] [address]     Report Status 03/28/2022 FINAL  Final     Radiology Studies: No results found.  Scheduled Meds:  [MAR Hold] Chlorhexidine Gluconate Cloth  6 each Topical Daily   [MAR Hold] levothyroxine  175 mcg Oral QAC breakfast   [MAR Hold] lipase/protease/amylase  36,000 Units Oral TID WC   [MAR Hold] nicotine  21 mg Transdermal Daily   Continuous Infusions:  sodium chloride     0.9 % NaCl with KCl 20 mEq / L 75 mL/hr at 03/29/22 0939   lactated ringers       LOS: 3  days   Time spent: 35 mins  Lakota Schweppe Wynetta Emery, MD How to contact the [facility] Attending or Consulting provider [place] or covering provider during after hours [place], for this patient?  Check the care team in [facility] and look for a) attending/consulting [facility] provider listed and b) the [facility] team listed Log into [email or web address] and use [place]'s universal password to access. If you do not have the password, please contact the hospital operator. Locate the [facility] provider you are looking for under [facility] and page to a number that you can be directly reached. If you still have difficulty reaching the provider, please page the [facility] (Director on Call) for the Hospitalists listed on amion for assistance.  03/29/2022, 12:11 PM

## 2022-03-29 NOTE — Interval H&P Note (Signed)
History and Physical Interval Note:  03/29/2022 1:12 PM  Alexander Banks  has presented today for surgery, with the diagnosis of gastric outlet obstruction.  The various methods of treatment have been discussed with the patient and family. After consideration of risks, benefits and other options for treatment, the patient has consented to  Procedure(s): ESOPHAGOGASTRODUODENOSCOPY (EGD) WITH PROPOFOL (N/A) BALLOON DILATION (N/A) as a surgical intervention.  The patient's history has been reviewed, patient examined, no change in status, stable for surgery.  I have reviewed the patient's chart and labs.  Questions were answered to the patient's satisfaction.     Eloise Harman

## 2022-03-29 NOTE — Op Note (Signed)
[facility] Patient Name: Alexander Banks Procedure Date: 03/29/2022 1:46 PM MRN: [id number] Date of Birth: 02/15/1956 Attending MD: Elon Alas. Abbey Chatters , [place], [id number] CSN: [id number] Age: 66 Admit Type: Outpatient Procedure:                Upper GI endoscopy Indications:              Abnormal CT of the GI tract, Gastric outlet                            obstruction Providers:                Elon Alas. Abbey Chatters, DO, Caprice Kluver, Casimer Bilis, Technician Referring MD:              Medicines:                General Anesthesia Complications:            No immediate complications. Estimated Blood Loss:     Estimated blood loss was minimal. Procedure:                Pre-Anesthesia Assessment:                           - The anesthesia plan was to use monitored                            anesthesia care (MAC).                           After obtaining informed consent, the endoscope was                            passed under direct vision. Throughout the                            procedure, the patient's blood pressure, pulse, and                            oxygen saturations were monitored continuously. The                            [id number]-H190 ([id number]) scope was introduced through the                            mouth, and advanced to the jejunum. The upper GI                            endoscopy was accomplished without difficulty. The                            patient tolerated the procedure well. Scope In: 1:55:14 PM Scope Out: 2:13:44 PM Total Procedure Duration: 0 hours 18 minutes 30 seconds  Findings:      The examined esophagus was normal.      A  medium amount of food (residue) was found in the gastric body.      Localized mucosal changes characterized by congestion were found in the       jejunum, with associated tight lumen, no stricture. Possible external       comrpession. Biopsies were taken with a cold forceps for  histology. Impression:               - Normal esophagus.                           - A medium amount of food (residue) in the stomach.                           - Mucosal changes in the jejunum. Biopsied. Moderate Sedation:      Per Anesthesia Care Recommendation:           - Return patient to hospital ward for ongoing care.                           - Discussed with surgery. Clear liquid diet. Reglan                            QID. Procedure Code(s):        --- Professional ---                           [phone], Esophagogastroduodenoscopy, flexible,                            transoral; with biopsy, single or multiple Diagnosis Code(s):        --- Professional ---                           K63.89, Other specified diseases of intestine                           R93.3, Abnormal findings on diagnostic imaging of                            other parts of digestive tract CPT copyright 2022 American Medical Association. All rights reserved. The codes documented in this report are preliminary and upon coder review may  be revised to meet current compliance requirements. Elon Alas. Abbey Chatters, DO [name], DO 03/29/2022 2:23:03 PM This report has been signed electronically. Number of Addenda: 0

## 2022-03-29 NOTE — Anesthesia Preprocedure Evaluation (Addendum)
Anesthesia Evaluation  Patient identified by MRN, date of birth, ID band Patient awake    Reviewed: Allergy & Precautions, H&P , NPO status , Patient's Chart, lab work & pertinent test results  Airway Mallampati: II  TM Distance: >3 FB Neck ROM: Full    Dental  (+) Dental Advisory Given, Missing   Pulmonary sleep apnea , Current Smoker   Pulmonary exam normal breath sounds clear to auscultation       Cardiovascular Exercise Tolerance: Good hypertension, Pt. on medications Normal cardiovascular exam Rhythm:Regular Rate:Normal     Neuro/Psych negative neurological ROS  negative psych ROS   GI/Hepatic negative GI ROS, Neg liver ROS,,,  Endo/Other  diabetes, Well Controlled, Type 2, Oral Hypoglycemic Agents, Insulin DependentHypothyroidism    Renal/GU Renal disease  negative genitourinary   Musculoskeletal  (+) Arthritis , Osteoarthritis,    Abdominal   Peds negative pediatric ROS (+)  Hematology  (+) Blood dyscrasia, anemia   Anesthesia Other Findings IMPRESSION: 1. Notably distended stomach with narrowed/angulated appearance of the gastrojejunostomy, significance uncertain but relative outlet obstruction along the gastrojejunostomy cannot be excluded. 2. Ill definition of the porta hepatis regions with ill definition of tissue planes. Indistinct hypodense soft tissue in the region of the pancreatic head and abutting the right margin of the SMA, compatible tumor. 3. Prior lesser sac fluid collection is no longer present but there are bandlike cords of soft tissue density below the pancreatic body which could be due to local tumor extension, inflammation, or less likely infection. 4. Percutaneous biliary drain in place with loop formed in the right bile duct but near the confluence of the biliary tree. Adjacent stent in the common hepatic duct. There is still moderate intrahepatic biliary dilatation, although  improved from 01/29/2022. 5. Mild perihepatic ascites. 6. Narrowing of the proximal celiac trunk and proximal SMA due to median arcuate ligament and atheromatous vascular disease. 7. Lumbar impingement at L4-5 due to spondylosis and degenerative disc disease. 8. Mild prostatomegaly. 9. Borderline prominence of stool in the colon. 10. Aortic atherosclerosis.   Aortic Atherosclerosis (ICD10-I70.0).     Electronically Signed   By: Van Clines M.D.   On: 03/26/2022 17:22   Reproductive/Obstetrics negative OB ROS                             Anesthesia Physical Anesthesia Plan  ASA: 4 and emergent  Anesthesia Plan: General   Post-op Pain Management: Minimal or no pain anticipated   Induction: Intravenous, Rapid sequence and Cricoid pressure planned  PONV Risk Score and Plan: 2 and Ondansetron  Airway Management Planned: Oral ETT and Video Laryngoscope Planned  Additional Equipment:   Intra-op Plan:   Post-operative Plan: Extubation in OR  Informed Consent: I have reviewed the patients History and Physical, chart, labs and discussed the procedure including the risks, benefits and alternatives for the proposed anesthesia with the patient or authorized representative who has indicated his/her understanding and acceptance.     Dental advisory given  Plan Discussed with: CRNA and Surgeon  Anesthesia Plan Comments:         Anesthesia Quick Evaluation

## 2022-03-29 NOTE — Progress Notes (Signed)
Patient has refused CPAP for tonight.  Patient no longer has NG tube.

## 2022-03-29 NOTE — Anesthesia Postprocedure Evaluation (Signed)
Anesthesia Post Note  Patient: Alexander Banks  Procedure(s) Performed: ESOPHAGOGASTRODUODENOSCOPY (EGD) WITH PROPOFOL BIOPSY  Patient location during evaluation: Phase II Anesthesia Type: General Level of consciousness: awake and alert and oriented Pain management: pain level controlled Vital Signs Assessment: post-procedure vital signs reviewed and stable Respiratory status: spontaneous breathing, nonlabored ventilation and respiratory function stable Cardiovascular status: blood pressure returned to baseline and stable Postop Assessment: no apparent nausea or vomiting Anesthetic complications: no  No notable events documented.   Last Vitals:  Vitals:   03/29/22 1217 03/29/22 1422  BP: (!) 145/94 123/87  Pulse: 76 82  Resp: (!) 8 14  Temp: 36.6 C (!) 36.2 C  SpO2: 99% 100%    Last Pain:  Vitals:   03/29/22 1430  TempSrc:   PainSc: 0-No pain                 [name]

## 2022-03-30 DIAGNOSIS — F17200 Nicotine dependence, unspecified, uncomplicated: Secondary | ICD-10-CM

## 2022-03-30 DIAGNOSIS — E43 Unspecified severe protein-calorie malnutrition: Secondary | ICD-10-CM | POA: Diagnosis not present

## 2022-03-30 DIAGNOSIS — E039 Hypothyroidism, unspecified: Secondary | ICD-10-CM

## 2022-03-30 DIAGNOSIS — D6869 Other thrombophilia: Secondary | ICD-10-CM | POA: Diagnosis not present

## 2022-03-30 DIAGNOSIS — K311 Adult hypertrophic pyloric stenosis: Secondary | ICD-10-CM | POA: Diagnosis not present

## 2022-03-30 DIAGNOSIS — R634 Abnormal weight loss: Secondary | ICD-10-CM

## 2022-03-30 MED ORDER — INSULIN LISPRO (1 UNIT DIAL) 100 UNIT/ML (KWIKPEN): 2.0000 [IU] | PEN_INJECTOR | Freq: Three times a day (TID) | SUBCUTANEOUS | 1 refills | Status: DC

## 2022-03-30 MED ORDER — LEVOTHYROXINE SODIUM 175 MCG PO TABS: 175.0000 ug | ORAL_TABLET | Freq: Every day | ORAL | Status: DC

## 2022-03-30 MED ORDER — ENSURE ENLIVE PO LIQD: 237.0000 mL | Freq: Two times a day (BID) | ORAL | Status: DC

## 2022-03-30 MED ORDER — HEPARIN SOD (PORK) LOCK FLUSH 100 UNIT/ML IV SOLN
INTRAVENOUS | Status: AC
  Administered 2022-03-30: 500 [IU]
  Filled 2022-03-30: qty 5

## 2022-03-30 MED ORDER — AZELASTINE HCL 0.1 % NA SOLN: 2.0000 | Freq: Two times a day (BID) | NASAL | Status: DC | PRN

## 2022-03-30 MED ORDER — INSULIN DETEMIR 100 UNIT/ML FLEXPEN: 3.0000 [IU] | PEN_INJECTOR | Freq: Every day | SUBCUTANEOUS | 0 refills | Status: DC

## 2022-03-30 MED ORDER — HEPARIN SOD (PORK) LOCK FLUSH 100 UNIT/ML IV SOLN: 500.0000 [IU] | Freq: Once | INTRAVENOUS | Status: AC

## 2022-03-30 NOTE — Discharge Instructions (Addendum)
PLEASE CALL YOUR SURGEON IN [place] ON MONDAY TO ARRANGE A FOLLOW UP.  PLEASE STAY ON A FULL LIQUID DIET UNTIL YOU FOLLOW UP WITH YOUR SURGEON IN [place]   IMPORTANT INFORMATION: PAY CLOSE ATTENTION   PHYSICIAN DISCHARGE INSTRUCTIONS  Follow with Primary care provider  [name], MD  and other consultants as instructed by your Hospitalist Physician  [place] IF SYMPTOMS COME BACK, WORSEN OR NEW PROBLEM DEVELOPS   Please note: You were cared for by a hospitalist during your hospital stay. Every effort will be made to forward records to your primary care provider.  You can request that your primary care provider send for your hospital records if they have not received them.  Once you are discharged, your primary care physician will handle any further medical issues. Please note that NO REFILLS for any discharge medications will be authorized once you are discharged, as it is imperative that you return to your primary care physician (or establish a relationship with a primary care physician if you do not have one) for your post hospital discharge needs so that they can reassess your need for medications and monitor your lab values.  Please get a complete blood count and chemistry panel checked by your Primary MD at your next visit, and again as instructed by your Primary MD.  Get Medicines reviewed and adjusted: Please take all your medications with you for your next visit with your Primary MD  Laboratory/radiological data: Please request your Primary MD to go over all hospital tests and procedure/radiological results at the follow up, please ask your primary care provider to get all Hospital records sent to his/her office.  In some cases, they will be blood work, cultures and biopsy results pending at the time of your discharge. Please request that your primary care provider follow up on these results.  If you are diabetic, please bring your  blood sugar readings with you to your follow up appointment with primary care.    Please call and make your follow up appointments as soon as possible.    Also Note the following: If you experience worsening of your admission symptoms, develop shortness of breath, life threatening emergency, suicidal or homicidal thoughts you must seek medical attention immediately by calling 911 or calling your MD immediately  if symptoms less severe.  You must read complete instructions/literature along with all the possible adverse reactions/side effects for all the Medicines you take and that have been prescribed to you. Take any new Medicines after you have completely understood and accpet all the possible adverse reactions/side effects.   Do not drive when taking Pain medications or sleeping medications (Benzodiazepines)  Do not take more than prescribed Pain, Sleep and Anxiety Medications. It is not advisable to combine anxiety,sleep and pain medications without talking with your primary care practitioner  Special Instructions: If you have smoked or chewed Tobacco  in the last 2 yrs please stop smoking, stop any regular Alcohol  and or any Recreational drug use.  Wear Seat belts while driving.  Do not drive if taking any narcotic, mind altering or controlled substances or recreational drugs or alcohol.

## 2022-03-30 NOTE — Progress Notes (Signed)
Discussed EGD results with patient and wife.  It appears that the narrowing may be due to extrinsic pressure or scarring resulting in somewhat of a tight turn.  The mucosa was edematous but did not appear cancerous.  Biopsy results are pending.  In light of these findings, it is recommended the patient be on a full liquid diet with easily digestible food.  He may supplement with boost or Ensure.  They will be calling their primary surgeon in [place] on Monday to get a follow-up appointment to further manage and treat his partial gastric outlet obstruction.

## 2022-03-30 NOTE — Assessment & Plan Note (Signed)
--   recommending ensure supplements between meals

## 2022-03-30 NOTE — Progress Notes (Signed)
Discharge instructions given patient verbalized understanding. Confirmed with Dr Arnoldo Morale for patient to resume eliquis pt and pts wife informed. Discharged via wheelchair by private vehicle.

## 2022-03-30 NOTE — Assessment & Plan Note (Signed)
--  full liquid diet recommended with ensure supplements between meals

## 2022-03-30 NOTE — Discharge Summary (Addendum)
Physician Discharge Summary  Alexander Banks TKZ:[id number] DOB: 1956-05-14 DOA: 03/26/2022  PCP: [name], MD Oncologist: [name] Surgeon: Dr. Raeanne Gathers - [facility] [place] [facility]  Admit date: 03/26/2022 Discharge date: 03/30/2022  Admitted From:  HOME  Disposition:  HOME   Recommendations for Outpatient Follow-up:  Follow up with Dr. Basilia Jumbo your surgeon in [place] by calling on Monday 04/01/22 Follow up with your oncologist in 1-2 weeks Follow up with your PCP in 2-3 weeks Continue Full Liquid Diet until you see your surgeon.   Discharge Condition: STABLE   CODE STATUS: FULL DIET: FULL LIQUID DIET WITH ENSURE SUPPLEMENTS BETWEEN MEALS   Brief Hospitalization Summary: Please see all hospital notes, images, labs for full details of the hospitalization. ADMISSION HPI:   66 y.o. male with medical history significant of iron deficiency anemia, diabetes mellitus type 2, hyperlipidemia, hypertension, prediabetes, and more presents to the ED with a chief complaint of abdominal and back pain.  Patient reports that started several weeks ago.  Its been worse since it started.  He just cannot take the pain anymore.  He reports the pain in his abdomen feels like a pressure.  It is constant.  Oxycodone helped it minimally.  He has had nausea and vomiting x1 that is nonbloody.  He has not had diarrhea.  Patient reports his bowel movements have been irregular for about a week but prior to that he was feeling pretty constipated.  He has had no hematochezia or melena.  Patient reports he continues remember when his last normal meal was.  He has no appetite.  The last time he had any p.o. solid food intake was 3 days ago.  In the interim he has been drinking water and Gatorade.  Patient reports the pain in his abdomen and pain is back for 2 separate pains.  The one in his abdomen feels like pressure.  The one in his back feels like a muscle spasm that is constant.  Oxycodone helps the 1  in his back slightly as well.  He has not had a fever.  He has no new pains.  He has no dysuria.  Patient reports at this time his pain is resolved and he is not sure what made it go away.  Patient denies any numbness.  He is in full control of his urine and bowels.  Patient has no other complaints at this time.   HOSPITAL COURSE BY PROBLEM   Assessment and Plan: * Gastric outlet obstruction -- s/p NG placement with good output reported -- NG discontinued on 11/10;  Tolerating full liquid diet.   Continue full liquid diet until follow up with surgeon in [place]  -- GI service planned EGD on 11/10 giving time for apixaban washout -- EGD: The examined esophagus was normal.      A medium amount of food (residue) was found in the gastric body.      Localized mucosal changes characterized by congestion were found in the       jejunum, with associated tight lumen, no stricture. Possible external       comrpession. Biopsies were taken with a cold forceps for histology.  Acquired thrombophilia ([place]) -- Dr. Arnoldo Morale informs he can restart apixaban on 11/11   Pressure injury of skin -- continue skin care protocol   Tobacco use disorder - Patient is a current smoker. - Nicotine patch ordered - Encouraged regarding cessation  Hypokalemia - this has been repleted    Abdominal pain -  CT abdomen shows a distended stomach with narrowed appearance of gastrojejunostomy.  Extension of known tumor.  Percutaneous biliary drain performed in the right bile duct, adjacent to the stent in the common bile duct with some biliary dilatation improved from January 29, 2022 - General surgery was consulted and recommended NG tube - NG tube in place on low intermittent suction - pain improved after NG placement and suctioning EGD COMPLETED ON 11/10 WITH DR. Abbey Chatters:   The examined esophagus was normal.      A medium amount of food (residue) was found in the gastric body.      Localized mucosal changes  characterized by congestion were found in the       jejunum, with associated tight lumen, no stricture. Possible external       comrpession. Biopsies were taken with a cold forceps for histology.  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 ([place]) - Patient takes 6 units of basal insulin at baseline, reduced to 3 units - prandial coverage reduced to 2 units  - continue frequent CBG monitoring recommended CBG (last 3)  Recent Labs    03/29/22 0544 03/29/22 0719 03/29/22 1618  GLUCAP 139* 148* 100*     Hypothyroidism - Continue Synthroid   Obstructive sleep apnea -- will offer nightly CPAP   Discharge Diagnoses:  Principal Problem:   Gastric outlet obstruction Active Problems:   Pancreatic adenocarcinoma ([place])   Acquired thrombophilia ([place])   Obstructive sleep apnea   Hypothyroidism   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   azelastine 0.1 % nasal spray Commonly known as: ASTELIN Place 2 sprays into both nostrils 2 (two) times daily as needed for allergies.   feeding supplement Liqd Take 237 mLs by mouth 2 (two) times daily between meals.   FreeStyle Libre 3 Sensor Misc 1 Piece by Does  not apply route every 14 (fourteen) days. Place 1 sensor on the skin every 14 days. Use to check glucose continuously Dx E11.65   insulin detemir 100 UNIT/ML FlexPen Commonly known as: LEVEMIR Inject 3 Units into the skin at bedtime. What changed: how much to take   insulin lispro 100 UNIT/ML KwikPen Commonly known as: HumaLOG KwikPen Inject 2 Units into the skin 3 (three) times daily before meals. What changed: how much to take   levothyroxine 175 MCG tablet Commonly known as: SYNTHROID Take 1 tablet (175 mcg total) by mouth daily before breakfast.   lipase/protease/amylase 36000 UNITS Cpep capsule Commonly known as: Creon Take 2 capsules (72,000 Units total) by mouth 3 (three) times daily with meals. May also take 1 capsule (36,000 Units total) as needed (with snacks).   OVER THE  COUNTER MEDICATION Magnesium   oxyCODONE 5 MG immediate release tablet Commonly known as: Oxy IR/ROXICODONE 1 to 1 and 1/2 q4 prn severe pain   sennosides-docusate sodium 8.6-50 MG tablet Commonly known as: SENOKOT-S Take 1 tablet by mouth daily.   sildenafil 100 MG tablet Commonly known as: VIAGRA TAKE 1/2 TO 1 TABLET BY MOUTH AS NEEDED FOR ERECTILE DYSFUNCTION What changed: See the new instructions.   sodium bicarbonate 650 MG tablet Take 1 tablet (650 mg total) by mouth 2 (two) times daily.        Follow-up Information     Raeanne Gathers, MD. Call on 04/01/2022.   Specialty: General Surgery Why: Hospital Follow Up Contact information: [place] [address] [phone]         Derek Jack, MD. Schedule an appointment as soon as possible for a visit in 1 week(s).   Specialty: Hematology Why: Hospital Follow Up Contact information: [place] [address] [phone]         [name], MD. Schedule an appointment as soon as possible for a visit in 2 day(s).   Specialty: Family Medicine Why: Hospital Follow Up Contact  information: [place] [address] [phone]                Allergies  Allergen Reactions   Lisinopril Cough   Allergies as of 03/30/2022       Reactions   Lisinopril Cough        Medication List     STOP taking these medications    lactulose 10 GM/15ML solution Commonly known as: CHRONULAC   tamsulosin 0.4 MG Caps capsule Commonly known as: FLOMAX       TAKE these medications    apixaban 5 MG Tabs tablet Commonly known as: ELIQUIS Take 1 tablet (5 mg total) by mouth 2 (two) times daily.   azelastine 0.1 % nasal spray Commonly known as: ASTELIN Place 2 sprays into both nostrils 2 (two) times daily as needed for allergies.   feeding supplement Liqd Take 237 mLs by mouth 2 (two) times daily between meals.   FreeStyle Libre 3 Sensor Misc 1 Piece by Does not apply route every 14 (fourteen) days. Place 1 sensor on the skin every 14 days. Use to check glucose continuously Dx E11.65   insulin detemir 100 UNIT/ML FlexPen Commonly known as: LEVEMIR Inject 3 Units into the skin at bedtime. What changed: how much to take   insulin lispro 100 UNIT/ML KwikPen Commonly known as: HumaLOG KwikPen Inject 2 Units into the skin 3 (three) times daily before meals. What changed: how much to take   levothyroxine 175 MCG tablet Commonly known as: SYNTHROID Take 1 tablet (175 mcg total) by mouth daily before breakfast.   lipase/protease/amylase 36000 UNITS Cpep capsule Commonly known as: Creon Take 2 capsules (72,000 Units total) by mouth 3 (three) times daily with meals. May also take 1 capsule (36,000 Units total) as needed (with snacks).   OVER THE COUNTER MEDICATION Magnesium   oxyCODONE 5 MG immediate release tablet Commonly known as: Oxy IR/ROXICODONE 1 to 1 and 1/2 q4 prn severe pain   sennosides-docusate sodium 8.6-50 MG tablet Commonly known as: SENOKOT-S Take 1 tablet by mouth daily.   sildenafil 100 MG tablet Commonly  known as: VIAGRA TAKE 1/2 TO 1 TABLET BY MOUTH AS NEEDED FOR ERECTILE DYSFUNCTION What changed: See the new instructions.   sodium bicarbonate 650 MG tablet Take 1 tablet (  650 mg total) by mouth 2 (two) times daily.        Procedures/Studies: DG Chest Portable 1 View  Result Date: 03/26/2022 CLINICAL DATA:  NG tube placement. EXAM: PORTABLE CHEST 1 VIEW COMPARISON:  03/04/2022. FINDINGS: The heart size and mediastinal contours are stable. There is atherosclerotic calcification of the aorta. The visualized lungs are clear. No effusion is seen. A stable right chest port terminates over the superior vena cava. An enteric tube courses over the stomach and out of the field of view. IMPRESSION: 1. No active disease. 2. An enteric tube courses over the stomach and out of the field of view. Electronically Signed   By: Brett Fairy M.D.   On: 03/26/2022 20:06   CT ABDOMEN PELVIS W CONTRAST  Result Date: 03/26/2022 CLINICAL DATA:  Pancreatic cancer, biliary stent. Nausea, vomiting, and abdominal pain. Weight loss. * Tracking Code: BO * EXAM: CT ABDOMEN AND PELVIS WITH CONTRAST TECHNIQUE: Multidetector CT imaging of the abdomen and pelvis was performed using the standard protocol following bolus administration of intravenous contrast. RADIATION DOSE REDUCTION: This exam was performed according to the departmental dose-optimization program which includes automated exposure control, adjustment of the mA and/or kV according to patient size and/or use of iterative reconstruction technique. CONTRAST:  140m OMNIPAQUE IOHEXOL 300 MG/ML  SOLN COMPARISON:  PET-CT 02/21/2022 and CT abdomen 01/29/2022 FINDINGS: Lower chest: Unremarkable Hepatobiliary: Percutaneous biliary drain in place with loop formed in the right bile duct but near the confluence of the biliary tree. This is also close to the proximal portion of a biliary stent. There is still moderate intrahepatic biliary dilatation, although improved from  01/29/2022. I do not see a well-defined liver lesion corresponding with the focus of accentuated metabolic activity shown on prior PET-CT. Highly indistinct porta hepatis regions with ill definition of tissue planes. Mild perihepatic ascites. Pancreas: Dilated dorsal pancreatic duct extending into an indistinctly marginated pancreatic head lesion presumably reflecting the underlying tumor, with ill-defined hypodensity in this region measuring about 3.5 by 1.9 cm on image 24 of series 2, and abutting the margin of the SMA although not completely wrapping around the SMA. The prior lesser sac fluid collection shown on 01/29/2022 is no longer present but there are bandlike cords of soft tissue density below the pancreatic body as on image 39 of series 5 which could be due to local tumor extension, inflammation, or less likely infection. Spleen: Unremarkable Adrenals/Urinary Tract: Stable small renal cyst do not require individual follow up. Urinary bladder unremarkable. No hydronephrosis. Adrenal glands unremarkable. Stomach/Bowel: Notably distended stomach. Narrowed gastrojejunostomy on images 32 through 37 of series 2, significance uncertain but relative outlet obstruction along the gastrojejunostomy cannot be excluded. Ill definition of the small bowel receiving pancreatic and biliary drainage. Borderline prominence of stool in the colon. A loop of bowel containing an air-fluid level extends anterior to the left hepatic lobe on image 21 series 2, this is more cephalad than prior extension of bowel. Vascular/Lymphatic: Atherosclerosis is present, including aortoiliac atherosclerotic disease. Narrowing of the proximal celiac trunk possibly due to median arcuate ligament and atheromatous vascular disease. There is also mild atherosclerotic narrowing of the proximal SMA. As noted above, indistinct hypodense soft tissue abuts the right margin of the SMA on image 24 series 2 may represent tumor. Collateral vessels extend  around the stomach. Reproductive: Mild prostatomegaly. Other: There is some mild hazy stranding in central mesentery more notable indistinct stranding along the porta hepatis. Trace perihepatic ascites. Musculoskeletal: Bridging spurring of both  sacroiliac joints. Lower lumbar degenerative facet arthropathy. Lumbar impingement at L4-5 due to spondylosis and degenerative disc disease. IMPRESSION: 1. Notably distended stomach with narrowed/angulated appearance of the gastrojejunostomy, significance uncertain but relative outlet obstruction along the gastrojejunostomy cannot be excluded. 2. Ill definition of the porta hepatis regions with ill definition of tissue planes. Indistinct hypodense soft tissue in the region of the pancreatic head and abutting the right margin of the SMA, compatible tumor. 3. Prior lesser sac fluid collection is no longer present but there are bandlike cords of soft tissue density below the pancreatic body which could be due to local tumor extension, inflammation, or less likely infection. 4. Percutaneous biliary drain in place with loop formed in the right bile duct but near the confluence of the biliary tree. Adjacent stent in the common hepatic duct. There is still moderate intrahepatic biliary dilatation, although improved from 01/29/2022. 5. Mild perihepatic ascites. 6. Narrowing of the proximal celiac trunk and proximal SMA due to median arcuate ligament and atheromatous vascular disease. 7. Lumbar impingement at L4-5 due to spondylosis and degenerative disc disease. 8. Mild prostatomegaly. 9. Borderline prominence of stool in the colon. 10. Aortic atherosclerosis. Aortic Atherosclerosis (ICD10-I70.0). Electronically Signed   By: Van Clines M.D.   On: 03/26/2022 17:22   IR BILIARY STENT(S) EXISTING ACCESS INC DILATION CATH EXCHANGE  Result Date: 03/15/2022 INDICATION: 66 year old male presents for possible treatment stricture at choledocho jejunostomy, with a prior internal  external biliary drain. EXAM: CHOLANGIOGRAM STENTING AND ANGIOPLASTY OF STRICTURE AT CHOLEDOCHO JEJUNOSTOMY DRAIN EXCHANGE MEDICATIONS: 2 g Rocephin; The antibiotic was administered within an appropriate time frame prior to the initiation of the procedure. ANESTHESIA/SEDATION: Moderate (conscious) sedation was employed during this procedure. A total of Versed 2.0 mg and Fentanyl 100 mcg was administered intravenously by the radiology nurse. Total intra-service moderate Sedation Time: 22 minutes. The patient's level of consciousness and vital signs were monitored continuously by radiology nursing throughout the procedure under my direct supervision. FLUOROSCOPY: Radiation Exposure Index (as provided by the fluoroscopic device): 41 mGy Kerma COMPLICATIONS: None PROCEDURE: Informed written consent was obtained from the patient after a thorough discussion of the procedural risks, benefits and alternatives. All questions were addressed. Maximal Sterile Barrier Technique was utilized including caps, mask, sterile gowns, sterile gloves, sterile drape, hand hygiene and skin antiseptic. A timeout was performed prior to the initiation of the procedure. Patient was positioned supine position on the image intensifier table. The right upper quadrant cleaning the indwelling drain were prepped and draped in the usual sterile fashion. 1% lidocaine was used for local anesthesia. Contrast was injected through the indwelling 10 French drain confirming location within the small bowel. Bentson wire was advanced through the drain and the drain was retracted. A 10 French 35 cm sheath was advanced on the Bentson wire into the small bowel. The introducer was withdrawn and then a pull-back cholangiogram was performed. Measurement was made and we elected to place an 8 mm x 40 mm uncovered biliary stent. The introducer was advanced through the sheath which was then placed into the small bowel. Introducer was removed and then we deployed the 8  mm x 40 mm biliary stent. Balloon angioplasty was performed 8 mm x 40 mm until the waist resolved and nominal diameter was achieved. Final image was recorded. The sheath was then removed and a 10 [place] externalized pigtail drainage catheter was placed. The drain was capped and sutured at the skin. Final image was acquired. Patient tolerated the procedure well and  remained hemodynamically stable throughout. No complications were encountered and no significant blood loss. IMPRESSION: Status post image guided stenting and angioplasty of stricture at the choledocho jejunostomy, with biliary drain exchange for externalized pigtail drain. Signed, Dulcy Fanny. Dellia Nims, ABVM, RPVI [facility] PLAN: The patient will return in 1-2 weeks for reassessment, lab values, and possible removal of the externalized drain. Electronically Signed   By: Corrie Mckusick D.O.   On: 03/15/2022 17:42   IR PATIENT EVAL TECH 0-60 MINS  Result Date: 03/12/2022 INDICATION: 66 year old gentleman with history of biliary obstruction due to pancreatic malignancy was treated with percutaneous biliary drain placement on 02/04/2022. He returns with leakage around the catheter entry site. EXAM: Evaluation of internal-external biliary drain to physical examination. MEDICATIONS: None ANESTHESIA/SEDATION: None COMPLICATIONS: None immediate. PROCEDURE: Patient was evaluated at bedside. There was significant leakage around the catheter entry site. The drain did not appear significantly retracted. The drain was attached to bag and allowed to evacuate the bile for several minutes. The patient was then observed for 15 additional minutes and no leakage was seen around the catheter entry site. IMPRESSION: 66 year old gentleman with history of pancreatic malignancy resulting in biliary obstruction, treated by percutaneous internal-external biliary drain placement returned to IR with increased leakage.  Once the drain was reattached to bag, the leaking stopped. PLAN: Patient scheduled for routine exchange on 04/01/2022. Electronically Signed   By: Miachel Roux M.D.   On: 03/12/2022 09:46   DG CHEST PORT 1 VIEW  Result Date: 03/04/2022 CLINICAL DATA:  Hypotension EXAM: PORTABLE CHEST 1 VIEW COMPARISON:  Radiographs 12/11/2021 FINDINGS: No focal consolidation, pleural effusion, or pneumothorax. Normal cardiomediastinal silhouette. No acute osseous abnormality. Remote left rib fracture. Accessed right chest wall Port-A-Cath with tip in the mid SVC. IMPRESSION: No active disease. Electronically Signed   By: Placido Sou M.D.   On: 03/04/2022 19:30     Subjective: Pt expresses strong desire to discharge now, tolerating liquid diet with no problems and having bowel movements  Discharge Exam: Vitals:   03/29/22 1458 03/29/22 2002  BP: (!) 146/88 (!) 140/91  Pulse:  87  Resp: 16 18  Temp: 97.7 F (36.5 C) 98.4 F (36.9 C)  SpO2: 100% 100%   Vitals:   03/29/22 1217 03/29/22 1422 03/29/22 1458 03/29/22 2002  BP: (!) 145/94 123/87 (!) 146/88 (!) 140/91  Pulse: 76 82  87  Resp: (!) '8 14 16 18  '$ Temp: 97.8 F (36.6 C) (!) 97.1 F (36.2 C) 97.7 F (36.5 C) 98.4 F (36.9 C)  TempSrc: Oral  Oral Oral  SpO2: 99% 100% 100% 100%  Weight:      Height:       General: Pt is alert, awake, not in acute distress Cardiovascular: RRR, S1/S2 +, no rubs, no gallops Respiratory: CTA bilaterally, no wheezing, no rhonchi Abdominal: Soft, NT, ND, bowel sounds + Extremities: no edema, no cyanosis   The results of significant diagnostics from this hospitalization (including imaging, microbiology, ancillary and laboratory) are listed below for reference.     Microbiology: Recent Results (from the past 240 hour(s))  Urine Culture     Status: None   Collection Time: 03/26/22  5:48 PM   Specimen: Urine, Clean Catch  Result Value Ref Range Status   Specimen Description   Final    URINE, CLEAN  CATCH Performed at [facility], [address] [place], [place] [address]    Special Requests   Final  NONE Performed at [facility], [address] [place], [place] [address]    Culture   Final    NO GROWTH Performed at [facility], [place] [address] [place], [place] [address]    Report Status 03/28/2022 FINAL  Final     Labs: BNP (last 3 results) No results for input(s): "BNP" in the last 8760 hours. Basic Metabolic Panel: Recent Labs  Lab 03/26/22 1453 03/27/22 0548 03/28/22 0418 03/29/22 0336  NA 133* 136 142 139  K 3.1* 3.6 4.2 4.1  CL 91* 98 107 109  CO2 '31 28 26 24  '$ GLUCOSE 115* 105* 108* 139*  BUN 27* '22 21 16  '$ CREATININE 1.14 1.00 1.04 0.84  CALCIUM 8.1* 7.7* 8.0* 7.9*  MG  --  1.8 1.8 1.8   Liver Function Tests: Recent Labs  Lab 03/26/22 1453 03/27/22 0548  AST 37 32  ALT 34 31  ALKPHOS 121 100  BILITOT 1.7* 1.6*  PROT 6.4* 5.4*  ALBUMIN 3.1* 2.5*   Recent Labs  Lab 03/26/22 1453  LIPASE 20   No results for input(s): "AMMONIA" in the last 168 hours. CBC: Recent Labs  Lab 03/26/22 1453 03/27/22 0548 03/28/22 0418  WBC 6.7 5.6 6.7  NEUTROABS 4.0 3.4  --   HGB 12.7* 12.3* 11.8*  HCT 37.9* 37.3* 36.4*  MCV 94.5 95.6 97.1  PLT 367 317 312   Cardiac Enzymes: No results for input(s): "CKTOTAL", "CKMB", "CKMBINDEX", "TROPONINI" in the last 168 hours. BNP: Invalid input(s): "POCBNP" CBG: Recent Labs  Lab 03/28/22 1646 03/28/22 2343 03/29/22 0544 03/29/22 0719 03/29/22 1618  GLUCAP 94 130* 139* 148* 100*   D-Dimer No results for input(s): "DDIMER" in the last 72 hours. Hgb A1c No results for input(s): "HGBA1C" in the last 72 hours. Lipid Profile No results for input(s): "CHOL", "HDL", "LDLCALC", "TRIG", "CHOLHDL", "LDLDIRECT" in the last 72 hours. Thyroid function studies No results for input(s): "TSH", "T4TOTAL", "T3FREE", "THYROIDAB" in the last 72 hours.  Invalid input(s): "FREET3" Anemia work up No results for  input(s): "VITAMINB12", "FOLATE", "FERRITIN", "TIBC", "IRON", "RETICCTPCT" in the last 72 hours. Urinalysis    Component Value Date/Time   COLORURINE AMBER (A) 03/26/2022 1748   APPEARANCEUR HAZY (A) 03/26/2022 1748   LABSPEC 1.025 03/26/2022 1748   PHURINE 6.0 03/26/2022 1748   GLUCOSEU NEGATIVE 03/26/2022 1748   HGBUR LARGE (A) 03/26/2022 1748   BILIRUBINUR NEGATIVE 03/26/2022 1748   KETONESUR 20 (A) 03/26/2022 1748   PROTEINUR 30 (A) 03/26/2022 1748   NITRITE NEGATIVE 03/26/2022 1748   LEUKOCYTESUR NEGATIVE 03/26/2022 1748   Sepsis Labs Recent Labs  Lab 03/26/22 1453 03/27/22 0548 03/28/22 0418  WBC 6.7 5.6 6.7   Microbiology Recent Results (from the past 240 hour(s))  Urine Culture     Status: None   Collection Time: 03/26/22  5:48 PM   Specimen: Urine, Clean Catch  Result Value Ref Range Status   Specimen Description   Final    URINE, CLEAN CATCH Performed at [facility], [address] [place], [place] [address]    Special Requests   Final    NONE Performed at [facility], [address], [place], [place] [address]    Culture   Final    NO GROWTH Performed at [facility], [place] [address] [place], [place] [address]    Report Status 03/28/2022 FINAL  Final   Time coordinating discharge: 36 mins   SIGNED:  Irwin Brakeman, MD  [facility] 03/30/2022, 10:09 AM  How to contact the [facility] Attending or Consulting provider [place] or covering provider during after hours [place], for this patient?  Check the care team in [facility] and look for a) attending/consulting [facility] provider listed and b) the [facility] team listed Log into [email or web address] and use [place]'s universal password to access. If you do not have the password, please contact the hospital operator. Locate the [facility] provider you are looking for under [facility] and page to a number that you can be directly reached. If you still have difficulty reaching the provider, please page the [facility] (Director  on Call) for the Hospitalists listed on amion for assistance.

## 2022-03-30 NOTE — Assessment & Plan Note (Addendum)
--   Dr. Arnoldo Morale informs he can restart apixaban on 11/11

## 2022-04-01 ENCOUNTER — Ambulatory Visit (INDEPENDENT_AMBULATORY_CARE_PROVIDER_SITE_OTHER): Payer: BC Managed Care – PPO | Admitting: Family Medicine

## 2022-04-01 ENCOUNTER — Encounter: Payer: Self-pay | Admitting: *Deleted

## 2022-04-01 ENCOUNTER — Inpatient Hospital Stay (HOSPITAL_COMMUNITY): Admit: 2022-04-01 | Payer: BC Managed Care – PPO

## 2022-04-01 ENCOUNTER — Other Ambulatory Visit: Payer: Self-pay | Admitting: Radiology

## 2022-04-01 VITALS — BP 102/64 | HR 110 | Temp 97.9°F | Ht 74.0 in | Wt 151.0 lb

## 2022-04-01 DIAGNOSIS — C259 Malignant neoplasm of pancreas, unspecified: Secondary | ICD-10-CM

## 2022-04-01 DIAGNOSIS — K311 Adult hypertrophic pyloric stenosis: Secondary | ICD-10-CM | POA: Diagnosis not present

## 2022-04-01 NOTE — Progress Notes (Signed)
Alexander Banks was contacted by telephone to verify understanding of discharge instructions status post their most recent discharge from the hospital on the date:  03/30/22.  Patient does not want to reschedule missed appointments at this time and will call back to reschedule.   Banner Zeiner's questions were addressed to their satisfaction upon completion of this post discharge follow-up call for outpatient oncology.

## 2022-04-01 NOTE — Telephone Encounter (Signed)
Transition Care Management Follow-up Telephone Call Date of discharge and from where: 03/30/2022 [place]  How have you been since you were released from the hospital? Still having ABD Pain  Any questions or concerns? No  Items Reviewed: Did the pt receive and understand the discharge instructions provided? Yes  Medications obtained and verified? Yes  Other? No  Any new allergies since your discharge? No  Dietary orders reviewed? Yes Do you have support at home? Yes   Home Care and Equipment/Supplies: Were home health services ordered? no If so, what is the name of the agency? N/a  Has the agency set up a time to come to the patient's home? not applicable Were any new equipment or medical supplies ordered?  No What is the name of the medical supply agency? N/a Were you able to get the supplies/equipment? not applicable Do you have any questions related to the use of the equipment or supplies? No  Functional Questionnaire: (I = Independent and D = Dependent) ADLs: I  Bathing/Dressing- I  Meal Prep- I  Eating- I  Maintaining continence- I  Transferring/Ambulation- I  Managing Meds- I  Follow up appointments reviewed:  PCP Hospital f/u appt confirmed? Yes  Scheduled to see Sallee Lange  on 04/01/2022 @ 310pm . [facility] f/u appt confirmed? No  Scheduled to see Dr. Helmut Muster  in [place]    Are transportation arrangements needed? No  If their condition worsens, is the pt aware to call PCP or go to the Emergency Dept.? Yes Was the patient provided with contact information for the PCP's office or ED? Yes Was to pt encouraged to call back with questions or concerns? Yes

## 2022-04-01 NOTE — Progress Notes (Unsigned)
   Subjective:    Patient ID: Alexander Banks, male    DOB: 1955-12-04, 66 y.o.   MRN: [id number]  HPI Patient recently in the hospital Diagnosed with gastric outlet obstruction Had several days with NG tube EGD did not find any internal stricture but does show some thing going on that is compressing gastric outlet area Since then he has had a couple episodes of involuntary vomiting Also at times belching Drinking mainly liquids and protein supplements Urinating okay Patient is hopeful that surgeon will admit him to [facility] tomorrow evening-because of this he did not take his Eliquis today hopeful that they would be able to do some sort of surgical procedure by Wednesday Has interventional radiology seeing him tomorrow to remove his biliary catheter Has held his Eliquis for now but patient was told there is a risk of blood clots if for some reason he is not admitted into the hospital on Tuesday evening it would be necessary for him to restart his Eliquis    Review of Systems     Objective:   Physical Exam Lungs clear heart regular blood pressure low extremities no edema skin warm dry       Assessment & Plan:  Patient to have biliary stent external removed tomorrow Family states they are going to [facility] for further evaluation with their surgeon We will communicate to the surgeon on Tuesday in our opinion this patient will need something done to help his gastric outlet obstruction.  I do not feel that he would be able to wait till the end of the month for treatment Hopeful there is something they can offer him surgically to assist with this If he is unable to any hospital admission tomorrow evening then he should restart his Eliquis We will send a copy of this along with discharge summary CAT scan and brief letter to his surgical specialists in [facility]

## 2022-04-02 ENCOUNTER — Other Ambulatory Visit: Payer: Self-pay

## 2022-04-02 ENCOUNTER — Other Ambulatory Visit (HOSPITAL_COMMUNITY): Payer: Self-pay | Admitting: Student

## 2022-04-02 ENCOUNTER — Encounter (HOSPITAL_COMMUNITY): Payer: Self-pay

## 2022-04-02 ENCOUNTER — Ambulatory Visit (HOSPITAL_COMMUNITY)
Admission: RE | Admit: 2022-04-02 | Discharge: 2022-04-02 | Disposition: A | Discharge status: Home / Self Care | Payer: BC Managed Care – PPO | Source: Ambulatory Visit | Attending: Student | Admitting: Student

## 2022-04-02 DIAGNOSIS — R17 Unspecified jaundice: Secondary | ICD-10-CM

## 2022-04-02 DIAGNOSIS — E119 Type 2 diabetes mellitus without complications: Secondary | ICD-10-CM | POA: Diagnosis not present

## 2022-04-02 DIAGNOSIS — K838 Other specified diseases of biliary tract: Secondary | ICD-10-CM

## 2022-04-02 DIAGNOSIS — Z90411 Acquired partial absence of pancreas: Secondary | ICD-10-CM | POA: Diagnosis not present

## 2022-04-02 DIAGNOSIS — E785 Hyperlipidemia, unspecified: Secondary | ICD-10-CM | POA: Insufficient documentation

## 2022-04-02 DIAGNOSIS — G4730 Sleep apnea, unspecified: Secondary | ICD-10-CM | POA: Insufficient documentation

## 2022-04-02 DIAGNOSIS — K55069 Acute infarction of intestine, part and extent unspecified: Secondary | ICD-10-CM | POA: Diagnosis not present

## 2022-04-02 DIAGNOSIS — Z86718 Personal history of other venous thrombosis and embolism: Secondary | ICD-10-CM | POA: Diagnosis not present

## 2022-04-02 DIAGNOSIS — E039 Hypothyroidism, unspecified: Secondary | ICD-10-CM | POA: Diagnosis not present

## 2022-04-02 DIAGNOSIS — R109 Unspecified abdominal pain: Secondary | ICD-10-CM

## 2022-04-02 DIAGNOSIS — K55019 Acute (reversible) ischemia of small intestine, extent unspecified: Secondary | ICD-10-CM | POA: Diagnosis not present

## 2022-04-02 DIAGNOSIS — I10 Essential (primary) hypertension: Secondary | ICD-10-CM | POA: Diagnosis not present

## 2022-04-02 DIAGNOSIS — E43 Unspecified severe protein-calorie malnutrition: Secondary | ICD-10-CM | POA: Diagnosis not present

## 2022-04-02 DIAGNOSIS — F17200 Nicotine dependence, unspecified, uncomplicated: Secondary | ICD-10-CM | POA: Diagnosis not present

## 2022-04-02 DIAGNOSIS — Z7189 Other specified counseling: Secondary | ICD-10-CM | POA: Diagnosis not present

## 2022-04-02 DIAGNOSIS — Z9049 Acquired absence of other specified parts of digestive tract: Secondary | ICD-10-CM | POA: Diagnosis not present

## 2022-04-02 DIAGNOSIS — Z794 Long term (current) use of insulin: Secondary | ICD-10-CM | POA: Diagnosis not present

## 2022-04-02 DIAGNOSIS — K3184 Gastroparesis: Secondary | ICD-10-CM | POA: Diagnosis not present

## 2022-04-02 DIAGNOSIS — C259 Malignant neoplasm of pancreas, unspecified: Secondary | ICD-10-CM

## 2022-04-02 DIAGNOSIS — Z8507 Personal history of malignant neoplasm of pancreas: Secondary | ICD-10-CM | POA: Diagnosis not present

## 2022-04-02 DIAGNOSIS — K831 Obstruction of bile duct: Secondary | ICD-10-CM | POA: Insufficient documentation

## 2022-04-02 DIAGNOSIS — T85590A Other mechanical complication of bile duct prosthesis, initial encounter: Secondary | ICD-10-CM | POA: Diagnosis not present

## 2022-04-02 DIAGNOSIS — K9189 Other postprocedural complications and disorders of digestive system: Secondary | ICD-10-CM | POA: Diagnosis not present

## 2022-04-02 DIAGNOSIS — Z515 Encounter for palliative care: Secondary | ICD-10-CM | POA: Diagnosis not present

## 2022-04-02 DIAGNOSIS — K3189 Other diseases of stomach and duodenum: Secondary | ICD-10-CM | POA: Diagnosis not present

## 2022-04-02 DIAGNOSIS — G4733 Obstructive sleep apnea (adult) (pediatric): Secondary | ICD-10-CM | POA: Diagnosis not present

## 2022-04-02 DIAGNOSIS — Z9221 Personal history of antineoplastic chemotherapy: Secondary | ICD-10-CM | POA: Diagnosis not present

## 2022-04-02 DIAGNOSIS — Z681 Body mass index (BMI) 19 or less, adult: Secondary | ICD-10-CM | POA: Diagnosis not present

## 2022-04-02 DIAGNOSIS — R1011 Right upper quadrant pain: Secondary | ICD-10-CM

## 2022-04-02 DIAGNOSIS — K311 Adult hypertrophic pyloric stenosis: Secondary | ICD-10-CM | POA: Diagnosis not present

## 2022-04-02 DIAGNOSIS — Z4682 Encounter for fitting and adjustment of non-vascular catheter: Secondary | ICD-10-CM | POA: Diagnosis not present

## 2022-04-02 DIAGNOSIS — Z79890 Hormone replacement therapy (postmenopausal): Secondary | ICD-10-CM | POA: Diagnosis not present

## 2022-04-02 HISTORY — PX: IR CONVERT BILIARY DRAIN TO INT EXT BILIARY DRAIN: IMG6045

## 2022-04-02 LAB — CBC WITH DIFFERENTIAL/PLATELET
Abs Immature Granulocytes: 0.02 10*3/uL (ref 0.00–0.07)
Basophils Absolute: 0 10*3/uL (ref 0.0–0.1)
Basophils Relative: 0 %
Eosinophils Absolute: 0.1 10*3/uL (ref 0.0–0.5)
Eosinophils Relative: 1 %
HCT: 35.1 % — ABNORMAL LOW (ref 39.0–52.0)
Hemoglobin: 11.5 g/dL — ABNORMAL LOW (ref 13.0–17.0)
Immature Granulocytes: 0 %
Lymphocytes Relative: 18 %
Lymphs Abs: 1.4 10*3/uL (ref 0.7–4.0)
MCH: 31.2 pg (ref 26.0–34.0)
MCHC: 32.8 g/dL (ref 30.0–36.0)
MCV: 95.1 fL (ref 80.0–100.0)
Monocytes Absolute: 0.7 10*3/uL (ref 0.1–1.0)
Monocytes Relative: 9 %
Neutro Abs: 5.2 10*3/uL (ref 1.7–7.7)
Neutrophils Relative %: 72 %
Platelets: 242 10*3/uL (ref 150–400)
RBC: 3.69 MIL/uL — ABNORMAL LOW (ref 4.22–5.81)
RDW: 14.4 % (ref 11.5–15.5)
WBC: 7.4 10*3/uL (ref 4.0–10.5)
nRBC: 0 % (ref 0.0–0.2)

## 2022-04-02 LAB — SURGICAL PATHOLOGY

## 2022-04-02 LAB — COMPREHENSIVE METABOLIC PANEL
ALT: 21 U/L (ref 0–44)
AST: 16 U/L (ref 15–41)
Albumin: 2.6 g/dL — ABNORMAL LOW (ref 3.5–5.0)
Alkaline Phosphatase: 91 U/L (ref 38–126)
Anion gap: 13 (ref 5–15)
BUN: 19 mg/dL (ref 8–23)
CO2: 22 mmol/L (ref 22–32)
Calcium: 8.1 mg/dL — ABNORMAL LOW (ref 8.9–10.3)
Chloride: 103 mmol/L (ref 98–111)
Creatinine, Ser: 1.01 mg/dL (ref 0.61–1.24)
GFR, Estimated: >60 mL/min (ref >60)
Glucose, Bld: 126 mg/dL — ABNORMAL HIGH (ref 70–99)
Potassium: 3.7 mmol/L (ref 3.5–5.1)
Sodium: 138 mmol/L (ref 135–145)
Total Bilirubin: 1.6 mg/dL — ABNORMAL HIGH (ref 0.3–1.2)
Total Protein: 6.1 g/dL — ABNORMAL LOW (ref 6.5–8.1)

## 2022-04-02 LAB — PROTIME-INR
INR: 1 (ref 0.8–1.2)
Prothrombin Time: 13.3 seconds (ref 11.4–15.2)

## 2022-04-02 MED ORDER — MIDAZOLAM HCL 2 MG/2ML IJ SOLN
INTRAMUSCULAR | Status: AC
  Filled 2022-04-02: qty 2

## 2022-04-02 MED ORDER — IOHEXOL 300 MG/ML  SOLN
50.0000 mL | Freq: Once | INTRAMUSCULAR | Status: AC | PRN
  Administered 2022-04-02: 14 mL

## 2022-04-02 MED ORDER — LIDOCAINE HCL 1 % IJ SOLN
INTRAMUSCULAR | Status: AC
  Administered 2022-04-02: 3 mL via INTRADERMAL
  Filled 2022-04-02: qty 20

## 2022-04-02 MED ORDER — FENTANYL CITRATE (PF) 100 MCG/2ML IJ SOLN
INTRAMUSCULAR | Status: AC | PRN
  Administered 2022-04-02: 50 ug via INTRAVENOUS

## 2022-04-02 MED ORDER — SODIUM CHLORIDE 0.9 % IV SOLN: INTRAVENOUS | Status: DC

## 2022-04-02 MED ORDER — HEPARIN SOD (PORK) LOCK FLUSH 10 UNIT/ML IV SOLN
10.0000 [IU] | Freq: Once | INTRAVENOUS | Status: DC
  Filled 2022-04-02: qty 1

## 2022-04-02 MED ORDER — SODIUM CHLORIDE 0.9 % IV SOLN
2.0000 g | Freq: Once | INTRAVENOUS | Status: DC
  Filled 2022-04-02: qty 2

## 2022-04-02 MED ORDER — MIDAZOLAM HCL 2 MG/2ML IJ SOLN
INTRAMUSCULAR | Status: AC | PRN
  Administered 2022-04-02: 1 mg via INTRAVENOUS

## 2022-04-02 MED ORDER — HEPARIN SOD (PORK) LOCK FLUSH 100 UNIT/ML IV SOLN
INTRAVENOUS | Status: AC
  Administered 2022-04-02: 500 [IU]
  Filled 2022-04-02: qty 5

## 2022-04-02 MED ORDER — FENTANYL CITRATE (PF) 100 MCG/2ML IJ SOLN
INTRAMUSCULAR | Status: AC
  Filled 2022-04-02: qty 2

## 2022-04-02 NOTE — Discharge Instructions (Signed)
Biliary Drainage Catheter Placement, Care After This sheet gives you information about how to care for yourself after your procedure. Your health care provider may also give you more specific instructions. If you have problems or questions, contact your health careprovider. What can I expect after the procedure? After the procedure, it is common to have: Pain or soreness at the catheter insertion site. Tiredness and sleepiness for several hours. Some bruising at the catheter insertion site. Drainage into the collection bag on the outside of your body, if you have an external drainage catheter or an internal-external drainage catheter. You might see bloody discharge in the bag for the first 1 or 2 days. Then, the discharge should turn a yellow-green color. Follow these instructions at home: Medicines Take over-the-counter and prescription medicines only as told by your health care provider. Do not take aspirin or blood thinners unless your health care provider says that you can. These can make bleeding worse. Ask your health care provider if the medicine prescribed to you requires you to avoid driving or using machinery. Eating and drinking Drink enough fluid to keep your urine pale yellow. Resume your usual diet as told by your health care provider.   Catheter insertion site care: Clean your catheter insertion site as told by your health care provider. Do not take baths, swim, or use a hot tub until your health care provider approves. Take showers only. Before showering, cover your catheter insertion site with a watertight covering to keep the area dry. Keep the skin around your catheter insertion site dry. If the area gets wet, dry the skin completely. Check your catheter insertion site every day for signs of infection. Check for: More redness, swelling, or pain. Fluid or blood. Warmth. Pus or a bad smell.   General instructions For 24 hours after your procedure: Do not drink  alcohol. Do not make legal decisions. If you were given a sedative during the procedure, it can affect you for several hours. Do not drive or operate machinery until your health care provider says that it is safe. Rest for the remainder of the day. Return to your normal activities as told by your health care provider. Ask your health care provider what activities are safe for you. Keep all follow-up visits as told by your health care provider. This is important. Contact a health care provider if: Your pain gets worse after it had improved, and it is not relieved with pain medicines. You have any questions about caring for your drainage catheter or collection bag. The skin around your catheter insertion site breaks down. You have any of these signs of infection around your catheter insertion site: More redness, swelling, or pain. Fluid or blood. Warmth. Pus or a bad smell. Get help right away if: You have a fever or chills. You have bile leaking around your drainage catheter. Your drainage catheter becomes blocked or clogged. Your drainage catheter comes out. Summary Clean the insertion site of your biliary drainage catheter as told by your health care provider. Keep the skin around your catheter insertion site dry. If the area gets wet, dry the skin completely. Check your catheter insertion site every day for signs of infection. Contact a health care provider if your pain gets worse after it had improved, and it is not relieved with pain medicines. Get help right away if your drainage catheter becomes blocked or clogged. This information is not intended to replace advice given to you by your health care provider. Make sure you  discuss any questions you have with your healthcare provider. Document Revised: 02/24/2019 Document Reviewed: 02/24/2019 Elsevier Patient Education  [place].      Moderate Conscious Sedation, Adult, Care After This sheet gives you information about  how to care for yourself after your procedure. Your health care provider may also give you more specific instructions. If you have problems or questions, contact your health care provider. What can I expect after the procedure? After the procedure, it is common to have: Sleepiness for several hours. Impaired judgment for several hours. Difficulty with balance. Vomiting if you eat too soon. Follow these instructions at home: For the time period you were told by your health care provider:     Rest. Do not participate in activities where you could fall or become injured. Do not drive or use machinery. Do not drink alcohol. Do not take sleeping pills or medicines that cause drowsiness. Do not make important decisions or sign legal documents. Do not take care of children on your own. Eating and drinking  Follow the diet recommended by your health care provider. Drink enough fluid to keep your urine pale yellow. If you vomit: Drink water, juice, or soup when you can drink without vomiting. Make sure you have little or no nausea before eating solid foods. General instructions Take over-the-counter and prescription medicines only as told by your health care provider. Have a responsible adult stay with you for the time you are told. It is important to have someone help care for you until you are awake and alert. Do not smoke. Keep all follow-up visits as told by your health care provider. This is important. Contact a health care provider if: You are still sleepy or having trouble with balance after 24 hours. You feel light-headed. You keep feeling nauseous or you keep vomiting. You develop a rash. You have a fever. You have redness or swelling around the IV site. Get help right away if: You have trouble breathing. You have new-onset confusion at home. Summary After the procedure, it is common to feel sleepy, have impaired judgment, or feel nauseous if you eat too soon. Rest after  you get home. Know the things you should not do after the procedure. Follow the diet recommended by your health care provider and drink enough fluid to keep your urine pale yellow. Get help right away if you have trouble breathing or new-onset confusion at home. This information is not intended to replace advice given to you by your health care provider. Make sure you discuss any questions you have with your health care provider. Document Revised: 09/03/2019 Document Reviewed: 04/01/2019 Elsevier Patient Education  [place].      Moderate Conscious Sedation, Adult, Care After This sheet gives you information about how to care for yourself after your procedure. Your health care provider may also give you more specific instructions. If you have problems or questions, contact your health care provider. What can I expect after the procedure? After the procedure, it is common to have: Sleepiness for several hours. Impaired judgment for several hours. Difficulty with balance. Vomiting if you eat too soon. Follow these instructions at home: For the time period you were told by your health care provider:     Rest. Do not participate in activities where you could fall or become injured. Do not drive or use machinery. Do not drink alcohol. Do not take sleeping pills or medicines that cause drowsiness. Do not make important decisions or sign legal documents.  Do not take care of children on your own. Eating and drinking  Follow the diet recommended by your health care provider. Drink enough fluid to keep your urine pale yellow. If you vomit: Drink water, juice, or soup when you can drink without vomiting. Make sure you have little or no nausea before eating solid foods. General instructions Take over-the-counter and prescription medicines only as told by your health care provider. Have a responsible adult stay with you for the time you are told. It is important to have someone help  care for you until you are awake and alert. Do not smoke. Keep all follow-up visits as told by your health care provider. This is important. Contact a health care provider if: You are still sleepy or having trouble with balance after 24 hours. You feel light-headed. You keep feeling nauseous or you keep vomiting. You develop a rash. You have a fever. You have redness or swelling around the IV site. Get help right away if: You have trouble breathing. You have new-onset confusion at home. Summary After the procedure, it is common to feel sleepy, have impaired judgment, or feel nauseous if you eat too soon. Rest after you get home. Know the things you should not do after the procedure. Follow the diet recommended by your health care provider and drink enough fluid to keep your urine pale yellow. Get help right away if you have trouble breathing or new-onset confusion at home. This information is not intended to replace advice given to you by your health care provider. Make sure you discuss any questions you have with your health care provider. Document Revised: 09/03/2019 Document Reviewed: 04/01/2019 Elsevier Patient Education  [place].

## 2022-04-02 NOTE — H&P (Addendum)
Referring Physician(s): Katragadda,S/Mansouraty,G  Supervising Physician: Corrie Mckusick  Patient Status:  [facility] OP  Chief Complaint:  Pancreatic cancer, biliary  obstruction  Subjective: Patient known to IR service from Port-A-Cath placement in 2022 and internal/external biliary drain placement on 02/04/2022.  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  He is status post stenting/angioplasty of stricture at the choledochojejunostomy and biliary drain exchange for externalized pigtail drain on 03/15/22.  He is scheduled today for cholangiogram with possible removal of the externalized biliary drain.  He denies fever, headache, chest pain, dyspnea, cough, abdominal pain, or bleeding.  He has had back pain, intermittent nausea and vomiting and weight loss. He has bilat hearing aids.  Additional med hx as listed below.   Past Medical History:  Diagnosis Date   Anemia, iron deficiency    Diabetes mellitus without complication ([place])    on meds   Hearing loss    Bil hearing aids   Hyperlipidemia    Hypertension    Port-A-Cath in place 12/26/2020   Prediabetes    Sleep apnea    Past Surgical History:  Procedure Laterality Date   BILIARY STENT PLACEMENT N/A 11/09/2020   Procedure: BILIARY STENT PLACEMENT - 13 F X 5 CM STENT;  Surgeon: Rogene Houston, MD;  Location: [facility]S;  Service: Endoscopy;  Laterality: N/A;   BILIARY STENT PLACEMENT N/A 12/27/2020   Procedure: BILIARY STENT PLACEMENT;  Surgeon: Rogene Houston, MD;  Location: [facility]S;  Service: Endoscopy;  Laterality: N/A;   COLONOSCOPY     ENTEROSCOPY N/A 12/27/2021   Procedure: ENTEROSCOPY;  Surgeon: Mansouraty, Telford Nab., MD;  Location: [facility] ENDOSCOPY;  Service: Gastroenterology;  Laterality: N/A;   ERCP N/A 11/09/2020   Procedure: ENDOSCOPIC RETROGRADE CHOLANGIOPANCREATOGRAPHY (ERCP);  Surgeon: Rogene Houston, MD;  Location: [facility];  Service: Endoscopy;   Laterality: N/A;   ERCP N/A 12/27/2020   Procedure: ENDOSCOPIC RETROGRADE CHOLANGIOPANCREATOGRAPHY (ERCP);  Surgeon: Rogene Houston, MD;  Location: [facility];  Service: Endoscopy;  Laterality: N/A;   ESOPHAGOGASTRODUODENOSCOPY (EGD) WITH PROPOFOL N/A 11/30/2020   Procedure: ESOPHAGOGASTRODUODENOSCOPY (EGD) WITH PROPOFOL;  Surgeon: Milus Banister, MD;  Location: [facility] ENDOSCOPY;  Service: Endoscopy;  Laterality: N/A;   EUS N/A 11/30/2020   Procedure: UPPER ENDOSCOPIC ULTRASOUND (EUS) RADIAL;  Surgeon: Milus Banister, MD;  Location: [facility] ENDOSCOPY;  Service: Endoscopy;  Laterality: N/A;   FINE NEEDLE ASPIRATION N/A 11/30/2020   Procedure: FINE NEEDLE ASPIRATION (FNA) LINEAR;  Surgeon: Milus Banister, MD;  Location: [facility] ENDOSCOPY;  Service: Endoscopy;  Laterality: N/A;   GIVENS CAPSULE STUDY     IR BILIARY DRAIN PLACEMENT WITH CHOLANGIOGRAM  02/04/2022   IR BILIARY STENT(S) EXISTING ACCESS INC DILATION CATH EXCHANGE  03/15/2022   IR IMAGING GUIDED PORT INSERTION  12/18/2020   IR PATIENT EVAL TECH 0-60 MINS  03/08/2022   KNEE SURGERY     4 surgeries on left, 2 on right knee   SHOULDER SURGERY     right shoulder   SPHINCTEROTOMY N/A 11/09/2020   Procedure: SHORT BILIARY SPHINCTEROTOMY;  Surgeon: Rogene Houston, MD;  Location: [facility];  Service: Endoscopy;  Laterality: N/A;   WHIPPLE PROCEDURE        Allergies: Lisinopril  Medications: Prior to Admission medications   Medication Sig Start Date End Date Taking? Authorizing Provider  insulin detemir (LEVEMIR) 100 UNIT/ML FlexPen Inject 3 Units into the skin at bedtime. 03/30/22  Yes [name], Clanford  L, MD  insulin lispro (HUMALOG KWIKPEN) 100 UNIT/ML KwikPen Inject 2 Units into the skin 3 (three) times daily before meals. 03/30/22  Yes [name], [name], MD  levothyroxine (SYNTHROID) 175 MCG tablet Take 1 tablet (175 mcg total) by mouth daily before breakfast. 03/30/22  Yes [name], [name], MD  lipase/protease/amylase (CREON) 36000  UNITS CPEP capsule Take 2 capsules (72,000 Units total) by mouth 3 (three) times daily with meals. May also take 1 capsule (36,000 Units total) as needed (with snacks). 03/07/22  Yes Emokpae, Courage, MD  OVER THE COUNTER MEDICATION Magnesium   Yes [provider]  oxyCODONE (OXY IR/ROXICODONE) 5 MG immediate release tablet 1 to 1 and 1/2 q4 prn severe pain 03/13/22  Yes Luking, Scott A, MD  sennosides-docusate sodium (SENOKOT-S) 8.6-50 MG tablet Take 1 tablet by mouth daily.   Yes [provider]  sodium bicarbonate 650 MG tablet Take 1 tablet (650 mg total) by mouth 2 (two) times daily. 03/07/22  Yes Emokpae, Courage, MD  apixaban (ELIQUIS) 5 MG TABS tablet Take 1 tablet (5 mg total) by mouth 2 (two) times daily. Patient not taking: Reported on 04/01/2022 12/19/21   [name], MD  azelastine (ASTELIN) 0.1 % nasal spray Place 2 sprays into both nostrils 2 (two) times daily as needed for allergies. Patient not taking: Reported on 04/01/2022 03/30/22   Murlean Iba, MD  Continuous Blood Gluc Sensor (FREESTYLE LIBRE 3 SENSOR) MISC 1 Piece by Does not apply route every 14 (fourteen) days. Place 1 sensor on the skin every 14 days. Use to check glucose continuously Dx E11.65 10/22/21   Cassandria Anger, MD  feeding supplement (ENSURE ENLIVE / ENSURE PLUS) LIQD Take 237 mLs by mouth 2 (two) times daily between meals. 03/08/22   Roxan Hockey, MD  sildenafil (VIAGRA) 100 MG tablet TAKE 1/2 TO 1 TABLET BY MOUTH AS NEEDED FOR ERECTILE DYSFUNCTION Patient taking differently: Take 50-100 mg by mouth as needed for erectile dysfunction. 12/03/21   [name], MD     Vital Signs: BP 114/77   Pulse (!) 105   Temp 97.9 F (36.6 C) (Oral)   Resp 18   SpO2 98%   Physical Exam awake, alert.  Chest with distant breath sounds bilaterally.  Intact right chest wall Port-A-Cath.  Heart with tachycardic but regular rhythm.  Abdomen soft, positive bowel sounds, nontender.  Intact  and capped right biliary drain.  Trace bilat pretibial edema.  Imaging: No results found.  Labs:  CBC: Recent Labs    03/13/22 1541 03/26/22 1453 03/27/22 0548 03/28/22 0418  WBC 7.3 6.7 5.6 6.7  HGB 12.2* 12.7* 12.3* 11.8*  HCT 36.8* 37.9* 37.3* 36.4*  PLT 423 367 317 312    COAGS: Recent Labs    12/11/21 1005 02/02/22 2202 02/04/22 0327  INR 1.1  --  1.2  APTT 33 31  --     BMP: Recent Labs    03/26/22 1453 03/27/22 0548 03/28/22 0418 03/29/22 0336  NA 133* 136 142 139  K 3.1* 3.6 4.2 4.1  CL 91* 98 107 109  CO2 '31 28 26 24  '$ GLUCOSE 115* 105* 108* 139*  BUN 27* '22 21 16  '$ CALCIUM 8.1* 7.7* 8.0* 7.9*  CREATININE 1.14 1.00 1.04 0.84  GFRNONAA >60 >60 >60 >60    LIVER FUNCTION TESTS: Recent Labs    03/04/22 1401 03/13/22 1541 03/26/22 1453 03/27/22 0548  BILITOT 3.1* 1.6* 1.7* 1.6*  AST 27 13 37 32  ALT 44 11 34 31  ALKPHOS 122 138* 121 100  PROT 8.1 6.9 6.4* 5.4*  ALBUMIN 3.4* 3.7* 3.1* 2.5*    Assessment and Plan: Patient known to IR service from Port-A-Cath placement in 2022 and internal/external biliary drain placement on 02/04/2022.  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  He is status post stenting/angioplasty of stricture at the choledochojejunostomy and biliary drain exchange for externalized pigtail drain on 03/15/22.  He is scheduled today for cholangiogram with possible removal of the externalized biliary drain.  Details/risks of procedure, including but not limited to, internal bleeding, infection, injury to adjacent structures, inability to remove biliary drain discussed with patient with his understanding and consent.  T BILI 1.6 today   Electronically Signed: D. Rowe Robert, PA-C 04/02/2022, 1:13 PM   I spent a total of 20 minutes at the the patient's bedside AND on the patient's hospital floor or  unit, greater than 50% of which was counseling/coordinating care for cholangiogram with possible external biliary drain removal

## 2022-04-02 NOTE — Procedures (Signed)
Interventional Radiology Procedure Note  Procedure:  Injection of biliary drain. Conversion of externalized MPD, to a formal int/ext biliary drain, 80F, capped  Findings: The stent is occluded, so the externalized drain was converted to an int/ext biliary drain .  Complications: None  Recommendations:  - Capped.  Patient understands that if he has new pain, fevers, rigors, chills, he can attach the gravity bag. - Do not submerge - Routine care - We will set up for a scheduled routine drain exchange in 8-10 weeks, and will discuss with team on next steps.    Signed,  Dulcy Fanny. Earleen Newport, DO

## 2022-04-03 DIAGNOSIS — Z86718 Personal history of other venous thrombosis and embolism: Secondary | ICD-10-CM | POA: Insufficient documentation

## 2022-04-03 DIAGNOSIS — K55069 Acute infarction of intestine, part and extent unspecified: Secondary | ICD-10-CM | POA: Insufficient documentation

## 2022-04-03 DIAGNOSIS — T85590A Other mechanical complication of bile duct prosthesis, initial encounter: Secondary | ICD-10-CM | POA: Insufficient documentation

## 2022-04-04 NOTE — Patient Instructions (Signed)
Visit Information  Thank you for taking time to visit with me today. Please don't hesitate to contact me if I can be of assistance to you.   Following are the goals we discussed today:   Goals Addressed               This Visit's Progress     Patient Stated     obtain  drain exchange/evaluation [facility]) (pt-stated)   Not on track     Care Coordination Interventions: Reviewed upcoming provider appointments and treatment appointments Nutrition assessment performed Secure chat to oncologist about  drain exchange/evaluation status, received a return message that he is agreeable to the plan.         Our next appointment is by telephone on 04/17/22 at 1:30 pm  Please call the care guide team at [phone] if you need to cancel or reschedule your appointment.   If you are experiencing a Mental Health or [place] or need someone to talk to, please call the Suicide and Crisis Lifeline: 988 call the [place] National Suicide Prevention Lifeline: 725-643-2420 or TTY: (254)290-1800 TTY 860 732 3677) to talk to a trained counselor call 1-800-273-TALK (toll free, 24 hour hotline) call the [facility]: [phone] call 911   Patient verbalizes understanding of instructions and care plan provided today and agrees to view in [place]. Active MyChart status and patient understanding of how to access instructions and care plan via MyChart confirmed with patient.     The patient has been provided with contact information for the care management team and has been advised to call with any health related questions or concerns.   Zeddie Njie L. Lavina Hamman, RN, BSN, [place] Coordinator Office number [phone]

## 2022-04-05 ENCOUNTER — Encounter (HOSPITAL_COMMUNITY): Payer: Self-pay | Admitting: Internal Medicine

## 2022-04-05 NOTE — Addendum Note (Signed)
Addendum  created 04/05/22 1037 by Karna Dupes, CRNA   Intraprocedure Staff edited

## 2022-04-07 NOTE — Addendum Note (Signed)
Addended by: Sallee Lange A on: 04/07/2022 08:58 AM   Modules accepted: Level of Service

## 2022-04-09 ENCOUNTER — Telehealth: Payer: Self-pay | Admitting: Family Medicine

## 2022-04-09 NOTE — Telephone Encounter (Signed)
This patient will need to have emergent consultation with home health Will need to have feeding machine and tubing and Osmolite feeding solution  Patient had J-tube placed in [place] was sent home and instructed to have home health Unfortunately the wife is called around to multiple places not able to find any home health places She is tried [facility], [facility], [facility], [facility], [facility] All of these have rejected her so far  The husband has advanced pancreatic cancer needs his tube feeding ASAP  Please check to see if [facility] can help Please talk with Desiree Hane  Also touch base with me before calling Caryl Pina I will be talking with cancer center in the morning to try to find out their input on this issue thank you

## 2022-04-10 ENCOUNTER — Ambulatory Visit: Payer: BC Managed Care – PPO

## 2022-04-10 ENCOUNTER — Telehealth: Payer: Self-pay | Admitting: Family Medicine

## 2022-04-10 ENCOUNTER — Other Ambulatory Visit: Payer: Self-pay

## 2022-04-10 DIAGNOSIS — C259 Malignant neoplasm of pancreas, unspecified: Secondary | ICD-10-CM

## 2022-04-10 NOTE — Telephone Encounter (Signed)
This issue has been handled.  Lavella Lemons and other nursing staff here work hard on this as well as Vernona Rieger at the cancer center.

## 2022-04-10 NOTE — Progress Notes (Signed)
Call received from patient's PCP, Dr. Wolfgang Phoenix that they received a call from the patient's wife who states that the patient has a new PEG tube and is in need of nursing support in house, tube feeding supplies and tube feeding with instructions. Call placed to [facility], patients wife, who tells me that they have only (3) 8oz cans of tube feeding but that she received no instructions on how often to feed and how much. She tells me that she was instructed on how to use the kangaroo pumps while he was inpatient and feels comfortable with those. We contacted 4 home health agencies who each declined to accept that patient at this time. Vaughan Basta tells me that Palliative Care is currently at the house and she feels comfortable with them being the in home support, no need for formal home health referral per wife. I have reached out to the nutritionists with the [facility] and asked them to reach out to [facility] for feeding and frequency instructions, we will provide 2 cases of Osmolite 1.5 today and a nutritionist will call later today. Tube feeding pump and supplies secured with [employer]. I have given Vaughan Basta their contact information so that she can arrange a time to pick it up. Vaughan Basta is aware of the above and verbalized understanding.

## 2022-04-10 NOTE — Telephone Encounter (Signed)
Note from hospital 04/07/22: MSW updated PAC order and spoke w/ infusion team. Per discussions w/ trx team and bedside, pt likely planning to leave inp hospital stay [place] to go home. Tube feed referral already sent and Telisha from infusion team reports she will get in contact w/ on call rep to get feedings and supplies delivered this evening. Theresia Bough also explained that OOP costs wont be able to be discussed but infusion team will reach out to pt tomorrow to discuss.  MSW spoke w/ pt spouse to discuss process of referral and getting accepted for nursing, therapies and feedings. MSW explained that this was not secured yet. Pt spouse was adamant about leaving hospital and returning home. MSW advised against this.   Trx team spoke w/ pt against leaving hospital at this time in order to stabilize and get home health secured. Pt and spouse adamant about returning home.   Feeding order placed in [place] as advised by Desiree Hane (representative for [facility]).

## 2022-04-10 NOTE — Progress Notes (Signed)
Home Health orders placed per [name]

## 2022-04-10 NOTE — Progress Notes (Signed)
Nutrition  RD contacted by Lilia Pro, RN from [facility] at [facility] regarding recommendations for home tube feeding.  Patient left from [facility] [place] on Monday, 11/20 afternoon.  Home tube feeding not set up for patient.   RD called and spoke with wife Vaughan Basta.  Vaughan Basta reports that patient has been on tube feeding since Friday, 11/17 in the hospital. He was started at a low rate and increased to 73m/hr on Saturday, 11/18 per wife.  He was at this rate until discharge on Monday, 11/20 around 3pm.  Wife reports that patient was tolerating tube feeding at this rate well, without nausea, diarrhea or other gastrointestinal issues.  Noted patient had gastrojejunostomy placed for venting (G) and draining and feeding (jejunum).  Noted that patient was admitted with GOO due to recurrence of pancreatic cancer. Per hospital notes was on TPN prior to tube being placed. Wife reports that home tube feeding formula never came after coming home.  She called PCP office and had a few cartons of osmolite 1.5. PCP office advised her to give a carton of osmolite via bolus method.  LVaughan Bastasaid that last evening very slowly gave 1 carton of nutrition via tube with 2106mwater flush after.  Patient tolerated without difficulty and has taken another carton today via bolus method. Patient is only taking sips of water via mouth but nothing else.  Has not had to vent tube at this time per wife.    Weight 151 lb on 11/13 Ht: 74 inches BMI: 19  [facility]  Labs: 11/14 Na 138, K 3.7 Glucose 126  Medications: creon, lispro, levemir, sodium bicarbonate, senokot  Kcals: 2040-2380 Protein: 102-119 g Fluids: 2000-2400 ml  Intervention: Tube feeding pump has been secured by [employer] wife will be picking this up later this afternoon. Instructed wife to wait and give tube feeding via pump vs bolus with J-tube (safer, less likely to cause stomach upset).  [facility]   Recommend starting feeding of osmolite 1.5 at 4014mr continuous and run until tomorrow am (at least 8-10 hours).  If tolerated at 41m53m can increase to 60ml64mand continue this 24 hour a day [facility].  Recommend water flush of 120 ml (1/2 cup) 6 times per day (lower volume bolus).  RD initially concerned about refeeding risk but less likely after patient previously tolerating feeding at this rate per wife. May need to increase rate to 70 ml/hr to better meet protein needs.  Wife interested in reducing time on pump for patient.  Would not recommend this over the weekend but can readdress once we know patient can tolerate at lower rate and refeeding labs are checked.   Tube feeding at 60 ml/hr provides 2160 calories, 89 g protein, 1800 ml free water (with flush) At 70 ml/hr provides 2520 calories, 104 g protein, 1980 ml free water Recommend checking CMP, Magnesium, Phosphorus on 11/27 and this was communicated with MorgaLilia Proand appointment set up.   Discussed with wife if patient showing signs of intolerance to tube feeding (diarrhea, severe cramping, stomach upset, vomiting) that she should not wait and take him to the nearest hospital over the holiday weekend.    Tube feeding orders will need to be written and submitted to DME after seen by RD on 11/27.   Wife appreciative of assistance and questions answered.  Defer creon administration to MD  Delsy Etzkorn B. Zenia Resides, [place], [place] Registered Dietitian [phone]

## 2022-04-13 ENCOUNTER — Other Ambulatory Visit: Payer: Self-pay

## 2022-04-13 ENCOUNTER — Emergency Department (HOSPITAL_COMMUNITY)
Admission: EM | Admit: 2022-04-13 | Discharge: 2022-04-13 | Disposition: A | Discharge status: Home / Self Care | Payer: BC Managed Care – PPO | Attending: Emergency Medicine | Admitting: Emergency Medicine

## 2022-04-13 DIAGNOSIS — Z794 Long term (current) use of insulin: Secondary | ICD-10-CM | POA: Insufficient documentation

## 2022-04-13 DIAGNOSIS — E1122 Type 2 diabetes mellitus with diabetic chronic kidney disease: Secondary | ICD-10-CM | POA: Insufficient documentation

## 2022-04-13 DIAGNOSIS — Z8507 Personal history of malignant neoplasm of pancreas: Secondary | ICD-10-CM | POA: Diagnosis not present

## 2022-04-13 DIAGNOSIS — N183 Chronic kidney disease, stage 3 unspecified: Secondary | ICD-10-CM | POA: Diagnosis not present

## 2022-04-13 DIAGNOSIS — K9423 Gastrostomy malfunction: Secondary | ICD-10-CM | POA: Diagnosis not present

## 2022-04-13 MED ORDER — SODIUM BICARBONATE 650 MG PO TABS
650.0000 mg | ORAL_TABLET | Freq: Once | ORAL | Status: AC
  Administered 2022-04-13: 650 mg
  Filled 2022-04-13: qty 1

## 2022-04-13 MED ORDER — PANCRELIPASE (LIP-PROT-AMYL) 10440-39150 UNITS PO TABS
20880.0000 [IU] | ORAL_TABLET | Freq: Once | ORAL | Status: AC
  Administered 2022-04-13: 20880 [IU]
  Filled 2022-04-13: qty 2

## 2022-04-13 NOTE — ED Provider Notes (Signed)
[facility] EMERGENCY DEPARTMENT Provider Note   CSN: [id number] Arrival date & time: 04/13/22  4235     History  Chief Complaint  Patient presents with   GI Problem    Alexander Banks is a 66 y.o. male. With past medical history HLD, OSA, T2DM, stage III CKD, pancreatic cancer s/p biliary drain placement and severe protein-calorie malnutrition who presents to the emergency department with possible clogged feeding tube.  Presents with wife who helps to provide some of the history.  She states that a PEG tube was placed about 1 week ago at [facility] in [place] due to gastric outlet obstruction secondary to his pancreatic cancer.  She states that he has been using tube feedings throughout the week but yesterday noticed that his blood sugar had leveled off, not requiring insulin, which is unusual while he is getting feedings.  She states that she then attempted to flush the tube and was unable to.  Patient denies any abdominal pain, bloating or distention, nausea, vomiting or diarrhea.  Is having normal bowel movements.  He is not currently on treatment.  He had chemotherapy and then a Whipple procedure in July followed by chemotherapy.  Treatment was stopped due to patient's weight loss and malnutrition.   HPI     Home Medications Prior to Admission medications   Medication Sig Start Date End Date Taking? Authorizing Provider  amoxicillin-clavulanate (AUGMENTIN) 875-125 MG tablet Take 1 tablet by mouth 2 (two) times daily. 04/08/22 04/13/22 Yes [provider]  azelastine (ASTELIN) 0.1 % nasal spray Place 2 sprays into both nostrils 2 (two) times daily as needed for allergies. 03/30/22  Yes [name], [name], MD  enoxaparin (LOVENOX) 80 MG/0.8ML injection Inject 80 mg into the skin every 12 (twelve) hours.   Yes [provider]  insulin detemir (LEVEMIR) 100 UNIT/ML FlexPen Inject 3 Units into the skin at bedtime. Patient taking differently: Inject  6 Units into the skin at bedtime. 03/30/22  Yes [name], [name], MD  insulin lispro (HUMALOG KWIKPEN) 100 UNIT/ML KwikPen Inject 2 Units into the skin 3 (three) times daily before meals. Patient taking differently: Inject 4-6 Units into the skin daily as needed (if Glucose > 100). 03/30/22  Yes [name], [name], MD  levothyroxine (SYNTHROID) 175 MCG tablet Take 1 tablet (175 mcg total) by mouth daily before breakfast. 03/30/22  Yes [name], [name], MD  lipase/protease/amylase (CREON) 36000 UNITS CPEP capsule Take 2 capsules (72,000 Units total) by mouth 3 (three) times daily with meals. May also take 1 capsule (36,000 Units total) as needed (with snacks). 03/07/22  Yes Emokpae, Courage, MD  oxyCODONE (OXY IR/ROXICODONE) 5 MG immediate release tablet 1 to 1 and 1/2 q4 prn severe pain Patient taking differently: Take 5-7.5 mg by mouth every 4 (four) hours as needed for severe pain. 03/13/22  Yes Luking, Elayne Snare, MD  polyethylene glycol (MIRALAX / GLYCOLAX) 17 g packet Take 17 g by mouth daily.   Yes [provider]  sennosides-docusate sodium (SENOKOT-S) 8.6-50 MG tablet Take 1 tablet by mouth daily as needed for constipation.   Yes [provider]  sildenafil (VIAGRA) 100 MG tablet TAKE 1/2 TO 1 TABLET BY MOUTH AS NEEDED FOR ERECTILE DYSFUNCTION Patient taking differently: Take 50-100 mg by mouth as needed for erectile dysfunction. 12/03/21  Yes [name], MD  sodium bicarbonate 650 MG tablet Take 1 tablet (650 mg total) by mouth 2 (two) times daily. 03/07/22  Yes Roxan Hockey, MD  ursodiol (ACTIGALL) 300 MG capsule Take 300 mg by mouth 3 (three) times daily.   Yes [provider]  apixaban (ELIQUIS) 5 MG TABS tablet Take 1 tablet (5 mg total) by mouth 2 (two) times daily. Patient not taking: Reported on 04/01/2022 12/19/21   [name], MD  Continuous Blood Gluc Sensor (FREESTYLE LIBRE 3 SENSOR) MISC 1 Piece by Does not apply route every 14 (fourteen)  days. Place 1 sensor on the skin every 14 days. Use to check glucose continuously Dx E11.65 10/22/21   Cassandria Anger, MD  feeding supplement (ENSURE ENLIVE / ENSURE PLUS) LIQD Take 237 mLs by mouth 2 (two) times daily between meals. Patient not taking: Reported on 04/13/2022 03/08/22   Roxan Hockey, MD      Allergies    Lisinopril    Review of Systems   Review of Systems  Gastrointestinal:  Negative for abdominal distention, abdominal pain, nausea and vomiting.  All other systems reviewed and are negative.   Physical Exam Updated Vital Signs BP 123/72   Pulse 81   Temp 97.9 F (36.6 C) (Oral)   Resp 15   Ht '6\' 2"'$  (1.88 m)   Wt 68.5 kg   SpO2 99%   BMI 19.39 kg/m  Physical Exam Vitals and nursing note reviewed.  Constitutional:      Appearance: He is ill-appearing.     Comments: Thin  HENT:     Head: Normocephalic.     Mouth/Throat:     Mouth: Mucous membranes are dry.     Pharynx: Oropharynx is clear.  Eyes:     General: No scleral icterus. Pulmonary:     Effort: Pulmonary effort is normal. No respiratory distress.  Abdominal:     General: Abdomen is flat. Bowel sounds are normal. There is no distension.     Palpations: Abdomen is soft.     Tenderness: There is no abdominal tenderness.     Comments: PEG tube does not appear to be dislodged.  There is a G and a J port.  The G port flushes and pulls back easily.  The J port will not flush or pull back.  No abdominal distention or tenderness.  Skin:    General: Skin is warm and dry.     Capillary Refill: Capillary refill takes less than 2 seconds.  Neurological:     General: No focal deficit present.     Mental Status: He is alert.  Psychiatric:        Mood and Affect: Mood normal.        Behavior: Behavior normal.     ED Results / Procedures / Treatments   Labs (all labs ordered are listed, but only abnormal results are displayed) Labs Reviewed - No data to display   EKG None  Radiology No  results found.  Procedures Procedures    Medications Ordered in ED Medications  lipase/protease/amylase) (VIOKACE) tablets 20,880 Units (20,880 Units Per Tube Given 04/13/22 1205)    And  sodium bicarbonate tablet 650 mg (650 mg Per Tube Given 04/13/22 1205)    ED Course/ Medical Decision Making/ A&P Clinical Course as of 04/13/22 1319  Sat Apr 13, 2022  1242 Consulted and spoke with Markus Daft, MD with IR. Will come evaluate PEG tube.  [LA]    Clinical Course User Index [LA] Mickie Hillier, PA-C  Medical Decision Making Amount and/or Complexity of Data Reviewed Labs: ordered.  Risk OTC drugs. Prescription drug management.  Initial Impression and Ddx 66 year old frail male with PEG tube dysfunction. The abdomen is flat, soft.  There is no evidence of distention or rigidity.  He is not having pain.  No nausea and vomiting.  Attempted to flush the J port which is clogged.  G port is functioning.  Will order pancreatic enzymes and bicarb and still and reassess Patient PMH that increases complexity of ED encounter: pancreatic cancer  Interpretation of Diagnostics I independent reviewed and interpreted the labs as followed: Not indicated  No imaging at this time Patient Reassessment and Ultimate Disposition/Management  Nursing staff attempted to instill pancreatic enzymes and bicarbonate without being able to unclog the J port.  I consulted and spoke with Dr. Anselm Pancoast, with interventional radiology.  Interventional radiology PA came down and was able to unclog the J port of the PEG tube.  The JMG ports of the PEG tube are both now flushing and pulling back without difficulty.  Wife is being sent home with smaller syringe to help with his clocks again.  Instructed return at any time for further dysfunction.  Following up with heme-onc and general surgery as already scheduled for ongoing care.  Patient management required discussion with the following  services or consulting groups:  Radiology/Interventional Radiology  Complexity of Problems Addressed Acute uncomplicated illness or injury with no diagnostics  Additional Data Reviewed and Analyzed Further history obtained from: Further history from spouse/family member, Prior ED visit notes, Recent Consult notes, and Care Everywhere  Patient Encounter Risk Assessment Minor Procedures and Consideration of hospitalization  Final Clinical Impression(s) / ED Diagnoses Final diagnoses:  Gastrostomy tube dysfunction [facility])    Rx / DC Orders ED Discharge Orders     None         Mickie Hillier, PA-C 04/13/22 1319    Malvin Johns, MD 04/13/22 1350

## 2022-04-13 NOTE — ED Triage Notes (Signed)
The pt has a new feeding tube  placed one week ago in [place]  last pm the family member attempted to irrigate it and it would not irrigate.  She thinks that it is clogged

## 2022-04-13 NOTE — Discharge Instructions (Signed)
You were seen in the emergency department today for a clogged PEG tube.  We were able to unclog this.  We sent you home with a small syringe to use if this recurs.  If you are unable to unclog the tube at home please return at any time.

## 2022-04-13 NOTE — Progress Notes (Signed)
Patient with G-J tube placed at outside facility. Patient presented to the ED today with a clogged j-tube. IR contacted for assistance. Patient assessed at the bedside in the ED. J-tube unclogged with a 10 cc syringe, a christmas-tree tube adapter and moderate amount of pressure. Tube unclogged after two attempts.   Both ports flushed with larger syringe and 20 ml water. Both ports easily flushed. I gave the 10 cc syringe with adapter to the patient's wife for future use. ED PA made aware that tube was functioning again.   Soyla Dryer, [place] [phone] 04/13/2022, 1:24 PM

## 2022-04-14 ENCOUNTER — Other Ambulatory Visit: Payer: Self-pay | Admitting: "Endocrinology

## 2022-04-15 ENCOUNTER — Inpatient Hospital Stay: Payer: BC Managed Care – PPO | Attending: Hematology

## 2022-04-15 ENCOUNTER — Inpatient Hospital Stay: Payer: BC Managed Care – PPO | Admitting: Dietician

## 2022-04-15 ENCOUNTER — Other Ambulatory Visit: Payer: Self-pay | Admitting: Family Medicine

## 2022-04-15 DIAGNOSIS — Z79899 Other long term (current) drug therapy: Secondary | ICD-10-CM | POA: Diagnosis not present

## 2022-04-15 DIAGNOSIS — K55069 Acute infarction of intestine, part and extent unspecified: Secondary | ICD-10-CM | POA: Diagnosis not present

## 2022-04-15 DIAGNOSIS — K311 Adult hypertrophic pyloric stenosis: Secondary | ICD-10-CM | POA: Diagnosis not present

## 2022-04-15 DIAGNOSIS — C259 Malignant neoplasm of pancreas, unspecified: Secondary | ICD-10-CM | POA: Insufficient documentation

## 2022-04-15 DIAGNOSIS — K9189 Other postprocedural complications and disorders of digestive system: Secondary | ICD-10-CM | POA: Diagnosis not present

## 2022-04-15 DIAGNOSIS — E43 Unspecified severe protein-calorie malnutrition: Secondary | ICD-10-CM | POA: Diagnosis not present

## 2022-04-15 DIAGNOSIS — E039 Hypothyroidism, unspecified: Secondary | ICD-10-CM

## 2022-04-15 LAB — CBC WITH DIFFERENTIAL/PLATELET
Abs Immature Granulocytes: 0.02 10*3/uL (ref 0.00–0.07)
Basophils Absolute: 0 10*3/uL (ref 0.0–0.1)
Basophils Relative: 1 %
Eosinophils Absolute: 0 10*3/uL (ref 0.0–0.5)
Eosinophils Relative: 1 %
HCT: 38.6 % — ABNORMAL LOW (ref 39.0–52.0)
Hemoglobin: 12.9 g/dL — ABNORMAL LOW (ref 13.0–17.0)
Immature Granulocytes: 0 %
Lymphocytes Relative: 28 %
Lymphs Abs: 1.7 10*3/uL (ref 0.7–4.0)
MCH: 31.5 pg (ref 26.0–34.0)
MCHC: 33.4 g/dL (ref 30.0–36.0)
MCV: 94.4 fL (ref 80.0–100.0)
Monocytes Absolute: 0.5 10*3/uL (ref 0.1–1.0)
Monocytes Relative: 9 %
Neutro Abs: 3.6 10*3/uL (ref 1.7–7.7)
Neutrophils Relative %: 61 %
Platelets: 371 10*3/uL (ref 150–400)
RBC: 4.09 MIL/uL — ABNORMAL LOW (ref 4.22–5.81)
RDW: 14.6 % (ref 11.5–15.5)
WBC: 5.8 10*3/uL (ref 4.0–10.5)
nRBC: 0 % (ref 0.0–0.2)

## 2022-04-15 LAB — COMPREHENSIVE METABOLIC PANEL
ALT: 14 U/L (ref 0–44)
AST: 15 U/L (ref 15–41)
Albumin: 3.2 g/dL — ABNORMAL LOW (ref 3.5–5.0)
Alkaline Phosphatase: 84 U/L (ref 38–126)
Anion gap: 9 (ref 5–15)
BUN: 16 mg/dL (ref 8–23)
CO2: 27 mmol/L (ref 22–32)
Calcium: 8.3 mg/dL — ABNORMAL LOW (ref 8.9–10.3)
Chloride: 99 mmol/L (ref 98–111)
Creatinine, Ser: 0.95 mg/dL (ref 0.61–1.24)
GFR, Estimated: >60 mL/min (ref >60)
Glucose, Bld: 150 mg/dL — ABNORMAL HIGH (ref 70–99)
Potassium: 4.1 mmol/L (ref 3.5–5.1)
Sodium: 135 mmol/L (ref 135–145)
Total Bilirubin: 0.8 mg/dL (ref 0.3–1.2)
Total Protein: 7 g/dL (ref 6.5–8.1)

## 2022-04-15 LAB — MAGNESIUM: Magnesium: 2.2 mg/dL (ref 1.7–2.4)

## 2022-04-15 LAB — PHOSPHORUS: Phosphorus: 3 mg/dL (ref 2.5–4.6)

## 2022-04-15 NOTE — Progress Notes (Signed)
Nutrition Follow-up:  Patient with recurrence of pancreatic cancer. S/p neoadjuvant Folfirinox followed by Whipple (Dr. Renato Shin - [facility] on 05/15/21) and adjuvant gemcitabine + xeloda  11/14-11/20 - admission to [facility] for GOO, s/p JPEG 11/16  Met with pt and wife in clinic. Pt reports some mild improvement in fatigue. Wife reports pt able to walk from car to office today. He is tolerating continuous Osmolite 1.5 via JPEG @ 60 ml/hr. He denies nausea, vomiting, diarrhea, constipation. Wife reports patient is taking medications orally. She is giving 120 ml water flushes 6 times daily via syringe. Wife reports flush on pump donated for short term use by [employer] is not working as it should. Wife received call from [place] (care coordinator from [facility]) during visit. Mendel Ryder confirmed patient will be receiving pump/supplies from [employer] this afternoon. Shipment of formula to arrive 11/28. Due to Osmolite 1.5 shortage, pt to receive [employer] 1.4.   Medications: reviewed   Labs: glucose 150  Anthropometrics: Pt weighed 137.8 lbs in office today  11/13 - 151 lb (PCP office visit)  -13 lbs (8.7%) in 2 weeks; severe  Estimated Energy Needs  Kcals: 2040-2380 Protein: 102-119 Fluid: >2L  NUTRITION DIAGNOSIS: Unintentional weight loss continues   MALNUTRITION DIAGNOSIS: Severe malnutrition continues   INTERVENTION:  Osmolite 1.5 - 7 cartons via pump @ 70 ml/hr provides 2485 kcal, 104 g protein, 1987 ml total water with flushes  When switching formula, recommend starting [employer] 1.4 @ 40 ml/hr x 10 hrs and assessing for tolerance. As tolerated, advance 10 ml q6h to goal of 68 ml/hr. Give additional 120 ml water flush 6x/day 5 cartons [employer] 1.4 @ 68 ml/hr provides 1625 ml/day, 2275 kcal, 100 g protein, 1170 ml free water (1890 ml total water with flushes)  Oral intake of thin liquids as tolerated/desired   MONITORING, EVALUATION, GOAL: weight trends, tube  feedings, intake   NEXT VISIT: via telephone

## 2022-04-16 MED ORDER — OXYCODONE HCL 5 MG PO TABS: ORAL_TABLET | ORAL | 0 refills | Status: DC

## 2022-04-17 ENCOUNTER — Ambulatory Visit: Payer: Self-pay | Admitting: *Deleted

## 2022-04-17 NOTE — Patient Instructions (Addendum)
Visit Information  Thank you for taking time to visit with me today. Please don't hesitate to contact me if I can be of assistance to you.   Following are the goals we discussed today:   Goals Addressed               This Visit's Progress     Patient Stated     obtain  drain exchange/evaluation [facility]) (pt-stated)   On track     Care Coordination Interventions: Reviewed upcoming provider appointments and treatment appointments Nutrition assessment performed        Our next appointment is by telephone on 05/27/22 at 1:30 pm  Please call the care guide team at [phone] if you need to cancel or reschedule your appointment.   If you are experiencing a Mental Health or [place] or need someone to talk to, please call the Suicide and Crisis Lifeline: 988 call the [place] National Suicide Prevention Lifeline: (707)878-6593 or TTY: (636)302-1673 TTY 628-155-9563) to talk to a trained counselor call 1-800-273-TALK (toll free, 24 hour hotline) call the [facility]: ([phone] call 911   Patient verbalizes understanding of instructions and care plan provided today and agrees to view in [place]. Active MyChart status and patient understanding of how to access instructions and care plan via MyChart confirmed with patient.     The patient has been provided with contact information for the care management team and has been advised to call with any health related questions or concerns.   [place]rokee Boccio L. Lavina Hamman, RN, BSN, [place] Coordinator Office number [phone]

## 2022-04-18 ENCOUNTER — Telehealth: Payer: Self-pay | Admitting: Family Medicine

## 2022-04-18 ENCOUNTER — Other Ambulatory Visit: Payer: Self-pay

## 2022-04-18 ENCOUNTER — Other Ambulatory Visit: Payer: Self-pay | Admitting: Family Medicine

## 2022-04-18 ENCOUNTER — Other Ambulatory Visit: Payer: Self-pay | Admitting: "Endocrinology

## 2022-04-18 NOTE — Telephone Encounter (Signed)
Had good phone conversation with patient and his wife Discussed with him the findings from the oncologist [name] as well as his surgeon in [place] Both specialists view what ever they can do for Younis is being palliative or being able to fight the cancer but not cure the cancer.  I did discuss the treatment that [name] was proposing they will consider this and then reach out to [name] should they decide to initiate that treatment The specialist from [facility] is supposed to call family in the next few days to discuss the procedure as they are recommending Family does have palliative care initiated supposed to come out next week We will give both of them some space to digest this information but then reach back out to them next week to see how they are doing.

## 2022-04-19 DIAGNOSIS — K311 Adult hypertrophic pyloric stenosis: Secondary | ICD-10-CM | POA: Diagnosis not present

## 2022-04-19 DIAGNOSIS — K55069 Acute infarction of intestine, part and extent unspecified: Secondary | ICD-10-CM | POA: Diagnosis not present

## 2022-04-19 DIAGNOSIS — E43 Unspecified severe protein-calorie malnutrition: Secondary | ICD-10-CM | POA: Diagnosis not present

## 2022-04-19 DIAGNOSIS — K9189 Other postprocedural complications and disorders of digestive system: Secondary | ICD-10-CM | POA: Diagnosis not present

## 2022-04-22 ENCOUNTER — Other Ambulatory Visit: Payer: Self-pay | Admitting: Family Medicine

## 2022-04-22 ENCOUNTER — Telehealth: Payer: Self-pay | Admitting: Family Medicine

## 2022-04-22 DIAGNOSIS — C259 Malignant neoplasm of pancreas, unspecified: Secondary | ICD-10-CM

## 2022-04-22 MED ORDER — OXYCODONE HCL 10 MG PO TABS: ORAL_TABLET | ORAL | 0 refills | Status: DC

## 2022-04-22 NOTE — Telephone Encounter (Signed)
Left message for Dr Laurence Ferrari to call you on your cell phone tomorrow. They stated he was one of only a few doctors to do that procedure.

## 2022-04-22 NOTE — Telephone Encounter (Signed)
Nurses Patient has pancreatic cancer Needs referral to interventional radiology for celiac nerve block Please also find out from IR who is the provider with interventional radiology working with him so I can reach out via EMR to communicate the particulars of this issue with them. Please see if IR can point me in the right direction of which provider to talk with

## 2022-04-23 ENCOUNTER — Other Ambulatory Visit: Payer: Self-pay

## 2022-04-23 MED ORDER — FREESTYLE LIBRE 3 SENSOR MISC: 1 refills | Status: DC

## 2022-04-24 NOTE — Telephone Encounter (Signed)
Referral ordered in EPIC. Referral Coordinator notified

## 2022-04-24 NOTE — Telephone Encounter (Signed)
I spoke with interventional radiology.  I also spoke with the family.  They are on board with doing the test.  Please go ahead with referral for celiac plexus ablation procedure.  When notifying interventional radiology please let them know that I did discuss the case with Dr. Laurence Ferrari  who indicated that they would hopefully be able to complete his situation over the course of the next couple weeks.  Please also notify Loma Sousa Also with family I did indicate that with the procedure he would stop his blood thinner 2 nights before the procedure and resume after the procedure

## 2022-04-25 DIAGNOSIS — Z515 Encounter for palliative care: Secondary | ICD-10-CM | POA: Diagnosis not present

## 2022-04-25 DIAGNOSIS — C259 Malignant neoplasm of pancreas, unspecified: Secondary | ICD-10-CM | POA: Diagnosis not present

## 2022-04-25 DIAGNOSIS — E43 Unspecified severe protein-calorie malnutrition: Secondary | ICD-10-CM | POA: Diagnosis not present

## 2022-04-25 DIAGNOSIS — C250 Malignant neoplasm of head of pancreas: Secondary | ICD-10-CM | POA: Diagnosis not present

## 2022-04-28 ENCOUNTER — Telehealth: Payer: Self-pay | Admitting: Family Medicine

## 2022-04-28 NOTE — Telephone Encounter (Signed)
I will reach out to the patient and his wife on Monday at lunchtime.The please confirm that that would be fine with them then send message back to me  Nurses-please touch base with patient for 2 things #1 let them know that I like to have some discussion with them Monday between noon and 1-I will call  #2 we can discuss pain control at that time the palliative nurse recommended fentanyl patch we can discuss this in further detail when we call  (See message from palliative nurse below) Bradley Ferris, NP  [name], MD Phone Number: [phone]   Dr. Nicki Reaper, I am out seeing Alexander Banks today and his pain is not really managed well. What do you think about adding a 23mg fentanyl patch? Also wanted to let you know that they have heard nothing yet from IR about the plexus block. FGolden Circlefree to call me or message me here.  RDixonNP [facility]

## 2022-04-30 ENCOUNTER — Telehealth: Payer: Self-pay | Admitting: Family Medicine

## 2022-04-30 DIAGNOSIS — E785 Hyperlipidemia, unspecified: Secondary | ICD-10-CM | POA: Diagnosis not present

## 2022-04-30 DIAGNOSIS — E118 Type 2 diabetes mellitus with unspecified complications: Secondary | ICD-10-CM | POA: Diagnosis not present

## 2022-04-30 DIAGNOSIS — H903 Sensorineural hearing loss, bilateral: Secondary | ICD-10-CM | POA: Diagnosis not present

## 2022-04-30 DIAGNOSIS — C259 Malignant neoplasm of pancreas, unspecified: Secondary | ICD-10-CM | POA: Diagnosis not present

## 2022-04-30 DIAGNOSIS — E43 Unspecified severe protein-calorie malnutrition: Secondary | ICD-10-CM | POA: Diagnosis not present

## 2022-04-30 DIAGNOSIS — M1990 Unspecified osteoarthritis, unspecified site: Secondary | ICD-10-CM | POA: Diagnosis not present

## 2022-04-30 DIAGNOSIS — I10 Essential (primary) hypertension: Secondary | ICD-10-CM | POA: Diagnosis not present

## 2022-04-30 DIAGNOSIS — E039 Hypothyroidism, unspecified: Secondary | ICD-10-CM | POA: Diagnosis not present

## 2022-04-30 DIAGNOSIS — R42 Dizziness and giddiness: Secondary | ICD-10-CM | POA: Diagnosis not present

## 2022-04-30 DIAGNOSIS — G4730 Sleep apnea, unspecified: Secondary | ICD-10-CM | POA: Diagnosis not present

## 2022-04-30 DIAGNOSIS — I82403 Acute embolism and thrombosis of unspecified deep veins of lower extremity, bilateral: Secondary | ICD-10-CM | POA: Diagnosis not present

## 2022-04-30 DIAGNOSIS — N183 Chronic kidney disease, stage 3 unspecified: Secondary | ICD-10-CM | POA: Diagnosis not present

## 2022-04-30 DIAGNOSIS — R531 Weakness: Secondary | ICD-10-CM | POA: Diagnosis not present

## 2022-04-30 DIAGNOSIS — I519 Heart disease, unspecified: Secondary | ICD-10-CM | POA: Diagnosis not present

## 2022-04-30 NOTE — Telephone Encounter (Signed)
Hospice nurse called to see if Dr.Scott would still be provider since pt is transitioning to Hospice today.  Interventional Radiology [phone]

## 2022-05-01 ENCOUNTER — Encounter: Payer: Self-pay | Admitting: Family Medicine

## 2022-05-01 ENCOUNTER — Other Ambulatory Visit: Payer: Self-pay | Admitting: Family Medicine

## 2022-05-01 DIAGNOSIS — R42 Dizziness and giddiness: Secondary | ICD-10-CM | POA: Diagnosis not present

## 2022-05-01 DIAGNOSIS — E43 Unspecified severe protein-calorie malnutrition: Secondary | ICD-10-CM | POA: Diagnosis not present

## 2022-05-01 DIAGNOSIS — M1990 Unspecified osteoarthritis, unspecified site: Secondary | ICD-10-CM | POA: Diagnosis not present

## 2022-05-01 DIAGNOSIS — N183 Chronic kidney disease, stage 3 unspecified: Secondary | ICD-10-CM | POA: Diagnosis not present

## 2022-05-01 DIAGNOSIS — I10 Essential (primary) hypertension: Secondary | ICD-10-CM | POA: Diagnosis not present

## 2022-05-01 DIAGNOSIS — H903 Sensorineural hearing loss, bilateral: Secondary | ICD-10-CM | POA: Diagnosis not present

## 2022-05-01 DIAGNOSIS — E785 Hyperlipidemia, unspecified: Secondary | ICD-10-CM | POA: Diagnosis not present

## 2022-05-01 DIAGNOSIS — E118 Type 2 diabetes mellitus with unspecified complications: Secondary | ICD-10-CM | POA: Diagnosis not present

## 2022-05-01 DIAGNOSIS — C259 Malignant neoplasm of pancreas, unspecified: Secondary | ICD-10-CM | POA: Diagnosis not present

## 2022-05-01 DIAGNOSIS — I519 Heart disease, unspecified: Secondary | ICD-10-CM | POA: Diagnosis not present

## 2022-05-01 DIAGNOSIS — Z515 Encounter for palliative care: Secondary | ICD-10-CM | POA: Insufficient documentation

## 2022-05-01 DIAGNOSIS — G4730 Sleep apnea, unspecified: Secondary | ICD-10-CM | POA: Diagnosis not present

## 2022-05-01 DIAGNOSIS — Z66 Do not resuscitate: Secondary | ICD-10-CM | POA: Insufficient documentation

## 2022-05-01 DIAGNOSIS — R531 Weakness: Secondary | ICD-10-CM | POA: Diagnosis not present

## 2022-05-01 DIAGNOSIS — E039 Hypothyroidism, unspecified: Secondary | ICD-10-CM | POA: Diagnosis not present

## 2022-05-01 DIAGNOSIS — I82403 Acute embolism and thrombosis of unspecified deep veins of lower extremity, bilateral: Secondary | ICD-10-CM | POA: Diagnosis not present

## 2022-05-01 MED ORDER — OXYCODONE HCL 10 MG PO TABS: ORAL_TABLET | ORAL | 0 refills | Status: DC

## 2022-05-01 NOTE — Telephone Encounter (Signed)
Patient is a hospice patient.  Unlikely that here due to the celiac plexus treatment.

## 2022-05-01 NOTE — Telephone Encounter (Signed)
Had thorough discussion with patient and his wife.  Currently oxycodone averaging 2 or 3 tablets/day they do not feel they are at the point of starting fentanyl patches.  We will hold off on this.  He is a hospice patient.

## 2022-05-02 ENCOUNTER — Other Ambulatory Visit (HOSPITAL_COMMUNITY): Payer: Self-pay | Admitting: Family Medicine

## 2022-05-02 DIAGNOSIS — I82403 Acute embolism and thrombosis of unspecified deep veins of lower extremity, bilateral: Secondary | ICD-10-CM | POA: Diagnosis not present

## 2022-05-02 DIAGNOSIS — C259 Malignant neoplasm of pancreas, unspecified: Secondary | ICD-10-CM

## 2022-05-02 DIAGNOSIS — N183 Chronic kidney disease, stage 3 unspecified: Secondary | ICD-10-CM | POA: Diagnosis not present

## 2022-05-02 DIAGNOSIS — R42 Dizziness and giddiness: Secondary | ICD-10-CM | POA: Diagnosis not present

## 2022-05-02 DIAGNOSIS — R531 Weakness: Secondary | ICD-10-CM | POA: Diagnosis not present

## 2022-05-02 DIAGNOSIS — M1990 Unspecified osteoarthritis, unspecified site: Secondary | ICD-10-CM | POA: Diagnosis not present

## 2022-05-02 DIAGNOSIS — E118 Type 2 diabetes mellitus with unspecified complications: Secondary | ICD-10-CM | POA: Diagnosis not present

## 2022-05-02 DIAGNOSIS — G4730 Sleep apnea, unspecified: Secondary | ICD-10-CM | POA: Diagnosis not present

## 2022-05-02 DIAGNOSIS — E43 Unspecified severe protein-calorie malnutrition: Secondary | ICD-10-CM | POA: Diagnosis not present

## 2022-05-02 DIAGNOSIS — E785 Hyperlipidemia, unspecified: Secondary | ICD-10-CM | POA: Diagnosis not present

## 2022-05-02 DIAGNOSIS — I10 Essential (primary) hypertension: Secondary | ICD-10-CM | POA: Diagnosis not present

## 2022-05-02 DIAGNOSIS — E039 Hypothyroidism, unspecified: Secondary | ICD-10-CM | POA: Diagnosis not present

## 2022-05-02 DIAGNOSIS — I519 Heart disease, unspecified: Secondary | ICD-10-CM | POA: Diagnosis not present

## 2022-05-02 DIAGNOSIS — H903 Sensorineural hearing loss, bilateral: Secondary | ICD-10-CM | POA: Diagnosis not present

## 2022-05-03 DIAGNOSIS — R531 Weakness: Secondary | ICD-10-CM | POA: Diagnosis not present

## 2022-05-03 DIAGNOSIS — G4730 Sleep apnea, unspecified: Secondary | ICD-10-CM | POA: Diagnosis not present

## 2022-05-03 DIAGNOSIS — I519 Heart disease, unspecified: Secondary | ICD-10-CM | POA: Diagnosis not present

## 2022-05-03 DIAGNOSIS — H903 Sensorineural hearing loss, bilateral: Secondary | ICD-10-CM | POA: Diagnosis not present

## 2022-05-03 DIAGNOSIS — E118 Type 2 diabetes mellitus with unspecified complications: Secondary | ICD-10-CM | POA: Diagnosis not present

## 2022-05-03 DIAGNOSIS — E039 Hypothyroidism, unspecified: Secondary | ICD-10-CM | POA: Diagnosis not present

## 2022-05-03 DIAGNOSIS — N183 Chronic kidney disease, stage 3 unspecified: Secondary | ICD-10-CM | POA: Diagnosis not present

## 2022-05-03 DIAGNOSIS — C259 Malignant neoplasm of pancreas, unspecified: Secondary | ICD-10-CM | POA: Diagnosis not present

## 2022-05-03 DIAGNOSIS — I10 Essential (primary) hypertension: Secondary | ICD-10-CM | POA: Diagnosis not present

## 2022-05-03 DIAGNOSIS — M1990 Unspecified osteoarthritis, unspecified site: Secondary | ICD-10-CM | POA: Diagnosis not present

## 2022-05-03 DIAGNOSIS — E785 Hyperlipidemia, unspecified: Secondary | ICD-10-CM | POA: Diagnosis not present

## 2022-05-03 DIAGNOSIS — E43 Unspecified severe protein-calorie malnutrition: Secondary | ICD-10-CM | POA: Diagnosis not present

## 2022-05-03 DIAGNOSIS — I82403 Acute embolism and thrombosis of unspecified deep veins of lower extremity, bilateral: Secondary | ICD-10-CM | POA: Diagnosis not present

## 2022-05-03 DIAGNOSIS — R42 Dizziness and giddiness: Secondary | ICD-10-CM | POA: Diagnosis not present

## 2022-05-04 DIAGNOSIS — R42 Dizziness and giddiness: Secondary | ICD-10-CM | POA: Diagnosis not present

## 2022-05-04 DIAGNOSIS — R531 Weakness: Secondary | ICD-10-CM | POA: Diagnosis not present

## 2022-05-04 DIAGNOSIS — E039 Hypothyroidism, unspecified: Secondary | ICD-10-CM | POA: Diagnosis not present

## 2022-05-04 DIAGNOSIS — I82403 Acute embolism and thrombosis of unspecified deep veins of lower extremity, bilateral: Secondary | ICD-10-CM | POA: Diagnosis not present

## 2022-05-04 DIAGNOSIS — E118 Type 2 diabetes mellitus with unspecified complications: Secondary | ICD-10-CM | POA: Diagnosis not present

## 2022-05-04 DIAGNOSIS — E785 Hyperlipidemia, unspecified: Secondary | ICD-10-CM | POA: Diagnosis not present

## 2022-05-04 DIAGNOSIS — C259 Malignant neoplasm of pancreas, unspecified: Secondary | ICD-10-CM | POA: Diagnosis not present

## 2022-05-04 DIAGNOSIS — I519 Heart disease, unspecified: Secondary | ICD-10-CM | POA: Diagnosis not present

## 2022-05-04 DIAGNOSIS — E43 Unspecified severe protein-calorie malnutrition: Secondary | ICD-10-CM | POA: Diagnosis not present

## 2022-05-04 DIAGNOSIS — I10 Essential (primary) hypertension: Secondary | ICD-10-CM | POA: Diagnosis not present

## 2022-05-04 DIAGNOSIS — M1990 Unspecified osteoarthritis, unspecified site: Secondary | ICD-10-CM | POA: Diagnosis not present

## 2022-05-04 DIAGNOSIS — G4730 Sleep apnea, unspecified: Secondary | ICD-10-CM | POA: Diagnosis not present

## 2022-05-04 DIAGNOSIS — N183 Chronic kidney disease, stage 3 unspecified: Secondary | ICD-10-CM | POA: Diagnosis not present

## 2022-05-04 DIAGNOSIS — H903 Sensorineural hearing loss, bilateral: Secondary | ICD-10-CM | POA: Diagnosis not present

## 2022-05-05 DIAGNOSIS — E039 Hypothyroidism, unspecified: Secondary | ICD-10-CM | POA: Diagnosis not present

## 2022-05-05 DIAGNOSIS — E43 Unspecified severe protein-calorie malnutrition: Secondary | ICD-10-CM | POA: Diagnosis not present

## 2022-05-05 DIAGNOSIS — H903 Sensorineural hearing loss, bilateral: Secondary | ICD-10-CM | POA: Diagnosis not present

## 2022-05-05 DIAGNOSIS — E118 Type 2 diabetes mellitus with unspecified complications: Secondary | ICD-10-CM | POA: Diagnosis not present

## 2022-05-05 DIAGNOSIS — R42 Dizziness and giddiness: Secondary | ICD-10-CM | POA: Diagnosis not present

## 2022-05-05 DIAGNOSIS — N183 Chronic kidney disease, stage 3 unspecified: Secondary | ICD-10-CM | POA: Diagnosis not present

## 2022-05-05 DIAGNOSIS — E785 Hyperlipidemia, unspecified: Secondary | ICD-10-CM | POA: Diagnosis not present

## 2022-05-05 DIAGNOSIS — G4730 Sleep apnea, unspecified: Secondary | ICD-10-CM | POA: Diagnosis not present

## 2022-05-05 DIAGNOSIS — C259 Malignant neoplasm of pancreas, unspecified: Secondary | ICD-10-CM | POA: Diagnosis not present

## 2022-05-05 DIAGNOSIS — I519 Heart disease, unspecified: Secondary | ICD-10-CM | POA: Diagnosis not present

## 2022-05-05 DIAGNOSIS — M1990 Unspecified osteoarthritis, unspecified site: Secondary | ICD-10-CM | POA: Diagnosis not present

## 2022-05-05 DIAGNOSIS — I82403 Acute embolism and thrombosis of unspecified deep veins of lower extremity, bilateral: Secondary | ICD-10-CM | POA: Diagnosis not present

## 2022-05-05 DIAGNOSIS — I10 Essential (primary) hypertension: Secondary | ICD-10-CM | POA: Diagnosis not present

## 2022-05-05 DIAGNOSIS — R531 Weakness: Secondary | ICD-10-CM | POA: Diagnosis not present

## 2022-05-06 ENCOUNTER — Telehealth: Payer: Self-pay | Admitting: "Endocrinology

## 2022-05-06 ENCOUNTER — Encounter (HOSPITAL_COMMUNITY): Payer: Self-pay | Admitting: Student

## 2022-05-06 DIAGNOSIS — M1990 Unspecified osteoarthritis, unspecified site: Secondary | ICD-10-CM | POA: Diagnosis not present

## 2022-05-06 DIAGNOSIS — I519 Heart disease, unspecified: Secondary | ICD-10-CM | POA: Diagnosis not present

## 2022-05-06 DIAGNOSIS — E43 Unspecified severe protein-calorie malnutrition: Secondary | ICD-10-CM | POA: Diagnosis not present

## 2022-05-06 DIAGNOSIS — E785 Hyperlipidemia, unspecified: Secondary | ICD-10-CM | POA: Diagnosis not present

## 2022-05-06 DIAGNOSIS — H903 Sensorineural hearing loss, bilateral: Secondary | ICD-10-CM | POA: Diagnosis not present

## 2022-05-06 DIAGNOSIS — E039 Hypothyroidism, unspecified: Secondary | ICD-10-CM | POA: Diagnosis not present

## 2022-05-06 DIAGNOSIS — E118 Type 2 diabetes mellitus with unspecified complications: Secondary | ICD-10-CM | POA: Diagnosis not present

## 2022-05-06 DIAGNOSIS — R531 Weakness: Secondary | ICD-10-CM | POA: Diagnosis not present

## 2022-05-06 DIAGNOSIS — R42 Dizziness and giddiness: Secondary | ICD-10-CM | POA: Diagnosis not present

## 2022-05-06 DIAGNOSIS — C259 Malignant neoplasm of pancreas, unspecified: Secondary | ICD-10-CM | POA: Diagnosis not present

## 2022-05-06 DIAGNOSIS — G4730 Sleep apnea, unspecified: Secondary | ICD-10-CM | POA: Diagnosis not present

## 2022-05-06 DIAGNOSIS — I10 Essential (primary) hypertension: Secondary | ICD-10-CM | POA: Diagnosis not present

## 2022-05-06 DIAGNOSIS — I82403 Acute embolism and thrombosis of unspecified deep veins of lower extremity, bilateral: Secondary | ICD-10-CM | POA: Diagnosis not present

## 2022-05-06 DIAGNOSIS — N183 Chronic kidney disease, stage 3 unspecified: Secondary | ICD-10-CM | POA: Diagnosis not present

## 2022-05-06 NOTE — Telephone Encounter (Signed)
Virtual visit scheduled.  

## 2022-05-06 NOTE — Telephone Encounter (Signed)
Pts daughter is requesting a phone visit. Pt is on hospice care now. Pt had an appt for this Wed which they had to cancel

## 2022-05-07 ENCOUNTER — Other Ambulatory Visit (HOSPITAL_COMMUNITY): Payer: Self-pay | Admitting: Student

## 2022-05-07 ENCOUNTER — Other Ambulatory Visit (HOSPITAL_COMMUNITY): Payer: Self-pay | Admitting: Physician Assistant

## 2022-05-07 DIAGNOSIS — K8689 Other specified diseases of pancreas: Secondary | ICD-10-CM

## 2022-05-07 DIAGNOSIS — H903 Sensorineural hearing loss, bilateral: Secondary | ICD-10-CM | POA: Diagnosis not present

## 2022-05-07 DIAGNOSIS — E039 Hypothyroidism, unspecified: Secondary | ICD-10-CM | POA: Diagnosis not present

## 2022-05-07 DIAGNOSIS — C259 Malignant neoplasm of pancreas, unspecified: Secondary | ICD-10-CM | POA: Diagnosis not present

## 2022-05-07 DIAGNOSIS — Z931 Gastrostomy status: Secondary | ICD-10-CM

## 2022-05-07 DIAGNOSIS — I519 Heart disease, unspecified: Secondary | ICD-10-CM | POA: Diagnosis not present

## 2022-05-07 DIAGNOSIS — I82403 Acute embolism and thrombosis of unspecified deep veins of lower extremity, bilateral: Secondary | ICD-10-CM | POA: Diagnosis not present

## 2022-05-07 DIAGNOSIS — R42 Dizziness and giddiness: Secondary | ICD-10-CM | POA: Diagnosis not present

## 2022-05-07 DIAGNOSIS — R531 Weakness: Secondary | ICD-10-CM | POA: Diagnosis not present

## 2022-05-07 DIAGNOSIS — E118 Type 2 diabetes mellitus with unspecified complications: Secondary | ICD-10-CM | POA: Diagnosis not present

## 2022-05-07 DIAGNOSIS — M1990 Unspecified osteoarthritis, unspecified site: Secondary | ICD-10-CM | POA: Diagnosis not present

## 2022-05-07 DIAGNOSIS — G4730 Sleep apnea, unspecified: Secondary | ICD-10-CM | POA: Diagnosis not present

## 2022-05-07 DIAGNOSIS — E785 Hyperlipidemia, unspecified: Secondary | ICD-10-CM | POA: Diagnosis not present

## 2022-05-07 DIAGNOSIS — I10 Essential (primary) hypertension: Secondary | ICD-10-CM | POA: Diagnosis not present

## 2022-05-07 DIAGNOSIS — N183 Chronic kidney disease, stage 3 unspecified: Secondary | ICD-10-CM | POA: Diagnosis not present

## 2022-05-07 DIAGNOSIS — E43 Unspecified severe protein-calorie malnutrition: Secondary | ICD-10-CM | POA: Diagnosis not present

## 2022-05-08 ENCOUNTER — Other Ambulatory Visit (HOSPITAL_COMMUNITY): Payer: Self-pay | Admitting: Family Medicine

## 2022-05-08 ENCOUNTER — Ambulatory Visit (HOSPITAL_COMMUNITY)
Admission: RE | Admit: 2022-05-08 | Discharge: 2022-05-08 | Disposition: A | Discharge status: Home / Self Care | Payer: BC Managed Care – PPO | Source: Ambulatory Visit | Attending: Physician Assistant | Admitting: Physician Assistant

## 2022-05-08 ENCOUNTER — Ambulatory Visit: Payer: BC Managed Care – PPO | Admitting: "Endocrinology

## 2022-05-08 ENCOUNTER — Ambulatory Visit (HOSPITAL_COMMUNITY)
Admission: RE | Admit: 2022-05-08 | Discharge: 2022-05-08 | Disposition: A | Discharge status: Home / Self Care | Payer: BC Managed Care – PPO | Source: Ambulatory Visit | Attending: Family Medicine | Admitting: Family Medicine

## 2022-05-08 ENCOUNTER — Other Ambulatory Visit: Payer: Self-pay

## 2022-05-08 DIAGNOSIS — N183 Chronic kidney disease, stage 3 unspecified: Secondary | ICD-10-CM | POA: Diagnosis not present

## 2022-05-08 DIAGNOSIS — H903 Sensorineural hearing loss, bilateral: Secondary | ICD-10-CM | POA: Diagnosis not present

## 2022-05-08 DIAGNOSIS — K8689 Other specified diseases of pancreas: Secondary | ICD-10-CM | POA: Insufficient documentation

## 2022-05-08 DIAGNOSIS — E1122 Type 2 diabetes mellitus with diabetic chronic kidney disease: Secondary | ICD-10-CM | POA: Diagnosis not present

## 2022-05-08 DIAGNOSIS — E785 Hyperlipidemia, unspecified: Secondary | ICD-10-CM | POA: Diagnosis not present

## 2022-05-08 DIAGNOSIS — R531 Weakness: Secondary | ICD-10-CM | POA: Diagnosis not present

## 2022-05-08 DIAGNOSIS — Z794 Long term (current) use of insulin: Secondary | ICD-10-CM | POA: Diagnosis not present

## 2022-05-08 DIAGNOSIS — I129 Hypertensive chronic kidney disease with stage 1 through stage 4 chronic kidney disease, or unspecified chronic kidney disease: Secondary | ICD-10-CM | POA: Diagnosis not present

## 2022-05-08 DIAGNOSIS — G548 Other nerve root and plexus disorders: Secondary | ICD-10-CM | POA: Diagnosis not present

## 2022-05-08 DIAGNOSIS — I10 Essential (primary) hypertension: Secondary | ICD-10-CM | POA: Diagnosis not present

## 2022-05-08 DIAGNOSIS — C251 Malignant neoplasm of body of pancreas: Secondary | ICD-10-CM

## 2022-05-08 DIAGNOSIS — M1990 Unspecified osteoarthritis, unspecified site: Secondary | ICD-10-CM | POA: Diagnosis not present

## 2022-05-08 DIAGNOSIS — I519 Heart disease, unspecified: Secondary | ICD-10-CM | POA: Diagnosis not present

## 2022-05-08 DIAGNOSIS — R42 Dizziness and giddiness: Secondary | ICD-10-CM | POA: Diagnosis not present

## 2022-05-08 DIAGNOSIS — G4730 Sleep apnea, unspecified: Secondary | ICD-10-CM | POA: Diagnosis not present

## 2022-05-08 DIAGNOSIS — C259 Malignant neoplasm of pancreas, unspecified: Secondary | ICD-10-CM | POA: Insufficient documentation

## 2022-05-08 DIAGNOSIS — I82403 Acute embolism and thrombosis of unspecified deep veins of lower extremity, bilateral: Secondary | ICD-10-CM | POA: Diagnosis not present

## 2022-05-08 DIAGNOSIS — E43 Unspecified severe protein-calorie malnutrition: Secondary | ICD-10-CM | POA: Diagnosis not present

## 2022-05-08 DIAGNOSIS — E118 Type 2 diabetes mellitus with unspecified complications: Secondary | ICD-10-CM | POA: Diagnosis not present

## 2022-05-08 LAB — CBC
HCT: 37.6 % — ABNORMAL LOW (ref 39.0–52.0)
Hemoglobin: 13.6 g/dL (ref 13.0–17.0)
MCH: 31.4 pg (ref 26.0–34.0)
MCHC: 36.2 g/dL — ABNORMAL HIGH (ref 30.0–36.0)
MCV: 86.8 fL (ref 80.0–100.0)
Platelets: 349 10*3/uL (ref 150–400)
RBC: 4.33 MIL/uL (ref 4.22–5.81)
RDW: 14.2 % (ref 11.5–15.5)
WBC: 9.8 10*3/uL (ref 4.0–10.5)
nRBC: 0 % (ref 0.0–0.2)

## 2022-05-08 LAB — PROTIME-INR
INR: 1 (ref 0.8–1.2)
Prothrombin Time: 12.7 seconds (ref 11.4–15.2)

## 2022-05-08 LAB — GLUCOSE, CAPILLARY
Glucose-Capillary: 145 mg/dL — ABNORMAL HIGH (ref 70–99)
Glucose-Capillary: 133 mg/dL — ABNORMAL HIGH (ref 70–99)

## 2022-05-08 MED ORDER — MIDAZOLAM HCL 2 MG/2ML IJ SOLN
INTRAMUSCULAR | Status: AC | PRN
  Administered 2022-05-08: .5 mg via INTRAVENOUS
  Administered 2022-05-08: 1 mg via INTRAVENOUS
  Administered 2022-05-08: .5 mg via INTRAVENOUS

## 2022-05-08 MED ORDER — SODIUM CHLORIDE 0.9 % IV SOLN: INTRAVENOUS | Status: DC

## 2022-05-08 MED ORDER — IOHEXOL 300 MG/ML  SOLN
50.0000 mL | Freq: Once | INTRAMUSCULAR | Status: AC | PRN
  Administered 2022-05-08: 50 mL via INTRAVENOUS

## 2022-05-08 MED ORDER — FENTANYL CITRATE (PF) 100 MCG/2ML IJ SOLN
INTRAMUSCULAR | Status: AC
  Filled 2022-05-08: qty 2

## 2022-05-08 MED ORDER — BUPIVACAINE HCL (PF) 0.5 % IJ SOLN
INTRAMUSCULAR | Status: AC
  Filled 2022-05-08: qty 30

## 2022-05-08 MED ORDER — MIDAZOLAM HCL 2 MG/2ML IJ SOLN
INTRAMUSCULAR | Status: AC
  Filled 2022-05-08: qty 2

## 2022-05-08 MED ORDER — LIDOCAINE HCL 1 % IJ SOLN
20.0000 mL | Freq: Once | INTRAMUSCULAR | Status: AC
  Administered 2022-05-08: 20 mL via INTRADERMAL

## 2022-05-08 MED ORDER — FENTANYL CITRATE (PF) 100 MCG/2ML IJ SOLN
INTRAMUSCULAR | Status: AC | PRN
  Administered 2022-05-08: 25 ug via INTRAVENOUS
  Administered 2022-05-08: 50 ug via INTRAVENOUS
  Administered 2022-05-08: 25 ug via INTRAVENOUS

## 2022-05-08 NOTE — Progress Notes (Signed)
Patient returned from radiology post procedure with a B/P of 79/64. Mingo Amber, RN reported that patients blood pressure dropped to 80 systolic prior to transport to short stay. MD aware and okay with patient being discharged at scheduled time of 1415. Patients wife reports this is normal for patient.

## 2022-05-08 NOTE — Progress Notes (Signed)
Patients wife wanted me to let MD know that patient had some bloody drainage in his drain bag that is new. MD made aware. No further orders.

## 2022-05-08 NOTE — Procedures (Signed)
Interventional Radiology Procedure Note  Date of Procedure: 05/08/2022  Procedure: Celiac plexus block using CT   Findings:  1. Celiac plexus block using CT guidance    Complications: No immediate complications noted.   Estimated Blood Loss: minimal  Follow-up and Recommendations: 1. Bedrest 1 hour    Albin Felling, MD  Vascular & Interventional Radiology  05/08/2022 1:26 PM

## 2022-05-08 NOTE — Discharge Instructions (Signed)
Blood thinners can be re-started tomorrow.

## 2022-05-08 NOTE — Progress Notes (Signed)
El-Abd, MD stated to keep patient an extra 30 minutes and if patient stays at baseline and doesn't change he can be discharged. Will continue to monitor.

## 2022-05-08 NOTE — H&P (Signed)
Chief Complaint: Patient was seen in consultation today for image-guided celiac plexus block  Referring Physician(s): Bethel A  Supervising Physician: Juliet Rude  Patient Status: [facility] - Out-pt  History of Present Illness: Alexander Banks is a 66 y.o. male with PMH significant for anemia, stage III CKD, type II diabetes mellitus, hearing loss, hypertension, obstructive sleep apnea, and pancreatic cancer s/p Whipple procedure in July 2023 being seen today for an image-guided celiac plexus block. The patient has been experiencing significant pain related to his cancer and has recently been placed on hospice care. The patient was referred to IR for image-guided celiac plexus block for pain management.   Past Medical History:  Diagnosis Date   Anemia, iron deficiency    Diabetes mellitus without complication ([place])    on meds   Hearing loss    Bil hearing aids   Hyperlipidemia    Hypertension    Port-A-Cath in place 12/26/2020   Prediabetes    Sleep apnea     Past Surgical History:  Procedure Laterality Date   BILIARY STENT PLACEMENT N/A 11/09/2020   Procedure: BILIARY STENT PLACEMENT - 66 F X 5 CM STENT;  Surgeon: Rogene Houston, MD;  Location: [facility] ORS;  Service: Endoscopy;  Laterality: N/A;   BILIARY STENT PLACEMENT N/A 12/27/2020   Procedure: BILIARY STENT PLACEMENT;  Surgeon: Rogene Houston, MD;  Location: [facility] ORS;  Service: Endoscopy;  Laterality: N/A;   BIOPSY  03/29/2022   Procedure: BIOPSY;  Surgeon: Eloise Harman, DO;  Location: [facility] ENDO SUITE;  Service: Endoscopy;;   COLONOSCOPY     ENTEROSCOPY N/A 12/27/2021   Procedure: ENTEROSCOPY;  Surgeon: Mansouraty, Telford Nab., MD;  Location: [facility] ENDOSCOPY;  Service: Gastroenterology;  Laterality: N/A;   ERCP N/A 11/09/2020   Procedure: ENDOSCOPIC RETROGRADE CHOLANGIOPANCREATOGRAPHY (ERCP);  Surgeon: Rogene Houston, MD;  Location: [facility];  Service: Endoscopy;  Laterality: N/A;   ERCP N/A 12/27/2020    Procedure: ENDOSCOPIC RETROGRADE CHOLANGIOPANCREATOGRAPHY (ERCP);  Surgeon: Rogene Houston, MD;  Location: [facility] OR[facility];  Service: Endoscopy;  Laterality: N/A;   ESOPHAGOGASTRODUODENOSCOPY (EGD) WITH PROPOFOL N/A 11/30/2020   Procedure: ESOPHAGOGASTRODUODENOSCOPY (EGD) WITH PROPOFOL;  Surgeon: Milus Banister, MD;  Location: [facility] ENDOSCOPY;  Service: Endoscopy;  Laterality: N/A;   ESOPHAGOGASTRODUODENOSCOPY (EGD) WITH PROPOFOL N/A 03/29/2022   Procedure: ESOPHAGOGASTRODUODENOSCOPY (EGD) WITH PROPOFOL;  Surgeon: Eloise Harman, DO;  Location: [facility];  Service: Endoscopy;  Laterality: N/A;   EUS N/A 11/30/2020   Procedure: UPPER ENDOSCOPIC ULTRASOUND (EUS) RADIAL;  Surgeon: Milus Banister, MD;  Location: [facility] ENDOSCOPY;  Service: Endoscopy;  Laterality: N/A;   FINE NEEDLE ASPIRATION N/A 11/30/2020   Procedure: FINE NEEDLE ASPIRATION (FNA) LINEAR;  Surgeon: Milus Banister, MD;  Location: [facility] ENDOSCOPY;  Service: Endoscopy;  Laterality: N/A;   GIVENS CAPSULE STUDY     IR BILIARY DRAIN PLACEMENT WITH CHOLANGIOGRAM  02/04/2022   IR BILIARY STENT(S) EXISTING ACCESS INC DILATION CATH EXCHANGE  03/15/2022   IR CONVERT BILIARY DRAIN TO INT EXT BILIARY DRAIN  04/02/2022   IR IMAGING GUIDED PORT INSERTION  12/18/2020   IR PATIENT EVAL TECH 0-60 MINS  03/08/2022   KNEE SURGERY     4 surgeries on left, 2 on right knee   SHOULDER SURGERY     right shoulder   SPHINCTEROTOMY N/A 11/09/2020   Procedure: SHORT BILIARY SPHINCTEROTOMY;  Surgeon: Rogene Houston, MD;  Location: [facility];  Service: Endoscopy;  Laterality: N/A;   WHIPPLE PROCEDURE  Allergies: Lisinopril  Medications: Prior to Admission medications   Medication Sig Start Date End Date Taking? Authorizing Provider  apixaban (ELIQUIS) 5 MG TABS tablet Take 1 tablet (5 mg total) by mouth 2 (two) times daily. Patient not taking: Reported on 04/01/2022 12/19/21   [name], MD  azelastine (ASTELIN) 0.1 % nasal spray Place 2 sprays into  both nostrils 2 (two) times daily as needed for allergies. 03/30/22   [name], [name], MD  Continuous Blood Gluc Sensor (FREESTYLE LIBRE 3 SENSOR) MISC PLACE 1 SENSOR ON THE SKIN EVERY 14 DAYS USE TO CHECK GLUCOSE CONTINUOSLY 04/23/22   Cassandria Anger, MD  enoxaparin (LOVENOX) 80 MG/0.8ML injection Inject 80 mg into the skin every 12 (twelve) hours.    [provider]  feeding supplement (ENSURE ENLIVE / ENSURE PLUS) LIQD Take 237 mLs by mouth 2 (two) times daily between meals. Patient not taking: Reported on 04/13/2022 03/08/22   Roxan Hockey, MD  insulin detemir (LEVEMIR FLEXTOUCH) 100 UNIT/ML FlexPen Inject 6 Units into the skin at bedtime. 04/19/22   Brita Romp, NP  insulin lispro (HUMALOG KWIKPEN) 100 UNIT/ML KwikPen Inject 2 Units into the skin 3 (three) times daily before meals. Patient taking differently: Inject 4-6 Units into the skin daily as needed (if Glucose > 100). 03/30/22   Murlean Iba, MD  levothyroxine (SYNTHROID) 175 MCG tablet TAKE 1 TABLET DAILY 04/16/22   [name], MD  lipase/protease/amylase (CREON) 36000 UNITS CPEP capsule Take 2 capsules (72,000 Units total) by mouth 3 (three) times daily with meals. May also take 1 capsule (36,000 Units total) as needed (with snacks). 03/07/22   Roxan Hockey, MD  Oxycodone HCl 10 MG TABS 1 to 2 every 4 hours as needed pain 05/01/22   Luking, Nicki Reaper A, MD  polyethylene glycol (MIRALAX / GLYCOLAX) 17 g packet Take 17 g by mouth daily.    [provider]  sennosides-docusate sodium (SENOKOT-S) 8.6-50 MG tablet Take 1 tablet by mouth daily as needed for constipation.    [provider]  sildenafil (VIAGRA) 100 MG tablet TAKE 1/2 TO 1 TABLET BY MOUTH AS NEEDED FOR ERECTILE DYSFUNCTION Patient taking differently: Take 50-100 mg by mouth as needed for erectile dysfunction. 12/03/21   [name], MD  sodium bicarbonate 650 MG tablet Take 1 tablet (650 mg total) by mouth 2 (two)  times daily. 03/07/22   Roxan Hockey, MD  ursodiol (ACTIGALL) 300 MG capsule Take 300 mg by mouth 3 (three) times daily.    [provider]     Family History  Problem Relation Age of Onset   Heart attack Father    Heart attack Paternal Grandfather    Leukemia Sister    Heart attack Brother     Social History   Socioeconomic History   Marital status: Married    Spouse name: Not on file   Number of children: Not on file   Years of education: Not on file   Highest education level: Not on file  Occupational History   Not on file  Tobacco Use   Smoking status: Every Day    Types: Cigars   Smokeless tobacco: Never   Tobacco comments:    Smoke 3-4 cigars a day  Vaping Use   Vaping Use: Never used  Substance and Sexual Activity   Alcohol use: Yes    Comment: occasional   Drug use: Never   Sexual activity: Not on file  Other Topics Concern  Not on file  Social History Narrative   Not on file   Social Determinants of Health   Financial Resource Strain: Low Risk  (04/17/2022)   Overall Financial Resource Strain (CARDIA)    Difficulty of Paying Living Expenses: Not hard at all  Food Insecurity: No Food Insecurity (04/17/2022)   Hunger Vital Sign    Worried About Running Out of Food in the Last Year: Never true    Ran Out of Food in the Last Year: Never true  Transportation Needs: No Transportation Needs (04/17/2022)   PRAPARE - Hydrologist (Medical): No    Lack of Transportation (Non-Medical): No  Physical Activity: Sufficiently Active (12/20/2020)   Exercise Vital Sign    Days of Exercise per Week: 5 days    Minutes of Exercise per Session: 30 min  Recent Concern: Physical Activity - Inactive (11/17/2020)   Exercise Vital Sign    Days of Exercise per Week: 0 days    Minutes of Exercise per Session: 0 min  Stress: No Stress Concern Present (04/17/2022)   [place]    Feeling of Stress : Only a little  Social Connections: Not on file      Review of Systems: A 12 point ROS discussed and pertinent positives are indicated in the HPI above.  All other systems are negative.  Review of Systems  Constitutional:  Positive for fatigue. Negative for chills and fever.  Respiratory:  Negative for chest tightness and shortness of breath.   Cardiovascular:  Negative for chest pain and leg swelling.  Gastrointestinal:  Negative for diarrhea, nausea and vomiting.  Neurological:  Negative for dizziness and headaches.  Psychiatric/Behavioral:  Negative for confusion.     Vital Signs: Temp (!) 97.1 F (36.2 C)   Resp 16   Ht '6\' 3"'$  (1.905 m)   Wt 136 lb (61.7 kg)   BMI 17.00 kg/m     Physical Exam Vitals reviewed.  HENT:     Mouth/Throat:     Mouth: Mucous membranes are moist.  Cardiovascular:     Rate and Rhythm: Normal rate and regular rhythm.     Pulses: Normal pulses.     Heart sounds: Normal heart sounds.  Pulmonary:     Effort: Pulmonary effort is normal.     Breath sounds: Normal breath sounds.  Abdominal:     Tenderness: There is no abdominal tenderness.  Musculoskeletal:     Right lower leg: No edema.     Left lower leg: No edema.  Skin:    General: Skin is warm and dry.  Neurological:     Mental Status: He is alert and oriented to person, place, and time.  Psychiatric:        Mood and Affect: Mood normal.        Behavior: Behavior normal.     Imaging: No results found.  Labs:  CBC: Recent Labs    03/28/22 0418 04/02/22 1305 04/15/22 1000 05/08/22 0829  WBC 6.7 7.4 5.8 9.8  HGB 11.8* 11.5* 12.9* 13.6  HCT 36.4* 35.1* 38.6* 37.6*  PLT 312 242 371 349    COAGS: Recent Labs    12/11/21 1005 02/02/22 2202 02/04/22 0327 04/02/22 1305 05/08/22 0915  INR 1.1  --  1.2 1.0 1.0  APTT 33 31  --   --   --     BMP: Recent Labs    03/28/22 0418 03/29/22 0336 04/02/22 1305  04/15/22 1000  NA 142  139 138 135  K 4.2 4.1 3.7 4.1  CL 107 109 103 99  CO2 '26 24 22 27  '$ GLUCOSE 108* 139* 126* 150*  BUN '21 16 19 16  '$ CALCIUM 8.0* 7.9* 8.1* 8.3*  CREATININE 1.04 0.84 1.01 0.95  GFRNONAA >60 >60 >60 >60    LIVER FUNCTION TESTS: Recent Labs    03/26/22 1453 03/27/22 0548 04/02/22 1305 04/15/22 1000  BILITOT 1.7* 1.6* 1.6* 0.8  AST 37 32 16 15  ALT 34 '31 21 14  '$ ALKPHOS 121 100 91 84  PROT 6.4* 5.4* 6.1* 7.0  ALBUMIN 3.1* 2.5* 2.6* 3.2*    TUMOR MARKERS: No results for input(s): "AFPTM", "CEA", "CA199", "CHROMGRNA" in the last 8760 hours.  Assessment and Plan:  VERLE WHEELING is a 66 yo male being seen today for management of severe pain associated with pancreatic cancer. The patient is a hospice patient and is wishing to avoid fentanyl patches for pain control at this time. The case has been approved by Dr Laurence Ferrari after discussion with the patient's PCP Dr Wolfgang Phoenix for image-guided celiac plexus block. The case has been reviewed with Dr Denna Haggard and is set to proceed on 05/08/22.   Risks and benefits of image-guided celiac plexus block was discussed with the patient and/or patient's family including, but not limited to bleeding, infection, and damage to adjacent structures.  All of the questions were answered and there is agreement to proceed.  Consent signed and in chart.  Thank you for this interesting consult.  I greatly enjoyed meeting Alexander Banks and look forward to participating in their care.  A copy of this report was sent to the requesting provider on this date.  Electronically Signed: Lura Em, PA-C 05/08/2022, 9:55 AM   I spent a total of   25 Minutes in face to face in clinical consultation, greater than 50% of which was counseling/coordinating care for image-guided celiac plexus block.

## 2022-05-09 DIAGNOSIS — R531 Weakness: Secondary | ICD-10-CM | POA: Diagnosis not present

## 2022-05-09 DIAGNOSIS — I82403 Acute embolism and thrombosis of unspecified deep veins of lower extremity, bilateral: Secondary | ICD-10-CM | POA: Diagnosis not present

## 2022-05-09 DIAGNOSIS — E43 Unspecified severe protein-calorie malnutrition: Secondary | ICD-10-CM | POA: Diagnosis not present

## 2022-05-09 DIAGNOSIS — E785 Hyperlipidemia, unspecified: Secondary | ICD-10-CM | POA: Diagnosis not present

## 2022-05-09 DIAGNOSIS — M1990 Unspecified osteoarthritis, unspecified site: Secondary | ICD-10-CM | POA: Diagnosis not present

## 2022-05-09 DIAGNOSIS — C259 Malignant neoplasm of pancreas, unspecified: Secondary | ICD-10-CM | POA: Diagnosis not present

## 2022-05-09 DIAGNOSIS — N183 Chronic kidney disease, stage 3 unspecified: Secondary | ICD-10-CM | POA: Diagnosis not present

## 2022-05-09 DIAGNOSIS — H903 Sensorineural hearing loss, bilateral: Secondary | ICD-10-CM | POA: Diagnosis not present

## 2022-05-09 DIAGNOSIS — E039 Hypothyroidism, unspecified: Secondary | ICD-10-CM | POA: Diagnosis not present

## 2022-05-09 DIAGNOSIS — G4730 Sleep apnea, unspecified: Secondary | ICD-10-CM | POA: Diagnosis not present

## 2022-05-09 DIAGNOSIS — R42 Dizziness and giddiness: Secondary | ICD-10-CM | POA: Diagnosis not present

## 2022-05-09 DIAGNOSIS — I519 Heart disease, unspecified: Secondary | ICD-10-CM | POA: Diagnosis not present

## 2022-05-09 DIAGNOSIS — E118 Type 2 diabetes mellitus with unspecified complications: Secondary | ICD-10-CM | POA: Diagnosis not present

## 2022-05-09 DIAGNOSIS — I10 Essential (primary) hypertension: Secondary | ICD-10-CM | POA: Diagnosis not present

## 2022-05-10 ENCOUNTER — Encounter: Payer: Self-pay | Admitting: "Endocrinology

## 2022-05-10 ENCOUNTER — Ambulatory Visit (INDEPENDENT_AMBULATORY_CARE_PROVIDER_SITE_OTHER): Payer: BC Managed Care – PPO | Admitting: "Endocrinology

## 2022-05-10 VITALS — BP 93/63 | Ht 74.0 in | Wt 138.0 lb

## 2022-05-10 DIAGNOSIS — I82403 Acute embolism and thrombosis of unspecified deep veins of lower extremity, bilateral: Secondary | ICD-10-CM | POA: Diagnosis not present

## 2022-05-10 DIAGNOSIS — E785 Hyperlipidemia, unspecified: Secondary | ICD-10-CM | POA: Diagnosis not present

## 2022-05-10 DIAGNOSIS — E43 Unspecified severe protein-calorie malnutrition: Secondary | ICD-10-CM | POA: Diagnosis not present

## 2022-05-10 DIAGNOSIS — I519 Heart disease, unspecified: Secondary | ICD-10-CM | POA: Diagnosis not present

## 2022-05-10 DIAGNOSIS — G4730 Sleep apnea, unspecified: Secondary | ICD-10-CM | POA: Diagnosis not present

## 2022-05-10 DIAGNOSIS — E089 Diabetes mellitus due to underlying condition without complications: Secondary | ICD-10-CM

## 2022-05-10 DIAGNOSIS — K8689 Other specified diseases of pancreas: Secondary | ICD-10-CM | POA: Diagnosis not present

## 2022-05-10 DIAGNOSIS — R42 Dizziness and giddiness: Secondary | ICD-10-CM | POA: Diagnosis not present

## 2022-05-10 DIAGNOSIS — E039 Hypothyroidism, unspecified: Secondary | ICD-10-CM | POA: Diagnosis not present

## 2022-05-10 DIAGNOSIS — H903 Sensorineural hearing loss, bilateral: Secondary | ICD-10-CM | POA: Diagnosis not present

## 2022-05-10 DIAGNOSIS — I10 Essential (primary) hypertension: Secondary | ICD-10-CM | POA: Diagnosis not present

## 2022-05-10 DIAGNOSIS — C259 Malignant neoplasm of pancreas, unspecified: Secondary | ICD-10-CM | POA: Diagnosis not present

## 2022-05-10 DIAGNOSIS — R531 Weakness: Secondary | ICD-10-CM | POA: Diagnosis not present

## 2022-05-10 DIAGNOSIS — N183 Chronic kidney disease, stage 3 unspecified: Secondary | ICD-10-CM | POA: Diagnosis not present

## 2022-05-10 DIAGNOSIS — E118 Type 2 diabetes mellitus with unspecified complications: Secondary | ICD-10-CM | POA: Diagnosis not present

## 2022-05-10 DIAGNOSIS — M1990 Unspecified osteoarthritis, unspecified site: Secondary | ICD-10-CM | POA: Diagnosis not present

## 2022-05-10 NOTE — Progress Notes (Signed)
05/10/2022, 2:39 PM  Endocrinology follow-up note This is a telephone visit due to the fact that patient is pulled placed in home hospice.  Subjective:    Patient ID: Alexander Banks, male    DOB: 1958-07-30.  Alexander Banks is being engaged in telehealth visit assisted by his wife for follow-up in the management of his diabetes.    Past Medical History:  Diagnosis Date   Anemia, iron deficiency    Diabetes mellitus without complication ([place])    on meds   Hearing loss    Bil hearing aids   Hyperlipidemia    Hypertension    Port-A-Cath in place 12/26/2020   Prediabetes    Sleep apnea     Past Surgical History:  Procedure Laterality Date   BILIARY STENT PLACEMENT N/A 11/09/2020   Procedure: BILIARY STENT PLACEMENT - 4 F X 5 CM STENT;  Surgeon: Rogene Houston, MD;  Location: [facility];  Service: Endoscopy;  Laterality: N/A;   BILIARY STENT PLACEMENT N/A 12/27/2020   Procedure: BILIARY STENT PLACEMENT;  Surgeon: Rogene Houston, MD;  Location: [facility];  Service: Endoscopy;  Laterality: N/A;   BIOPSY  03/29/2022   Procedure: BIOPSY;  Surgeon: Eloise Harman, DO;  [facility]: [facility];  Service: Endoscopy;;   COLONOSCOPY     ENTEROSCOPY N/A 12/27/2021   Procedure: ENTEROSCOPY;  Surgeon: Mansouraty, Telford Nab., MD;  Location: [facility] ENDOSCOPY;  Service: Gastroenterology;  Laterality: N/A;   ERCP N/A 11/09/2020   Procedure: ENDOSCOPIC RETROGRADE CHOLANGIOPANCREATOGRAPHY (ERCP);  Surgeon: Rogene Houston, MD;  Location: [facility];  Service: Endoscopy;  Laterality: N/A;   ERCP N/A 12/27/2020   Procedure: ENDOSCOPIC RETROGRADE CHOLANGIOPANCREATOGRAPHY (ERCP);  Surgeon: Rogene Houston, MD;  Location: [facility];  Service: Endoscopy;  Laterality: N/A;   ESOPHAGOGASTRODUODENOSCOPY (EGD) WITH PROPOFOL N/A 11/30/2020   Procedure: ESOPHAGOGASTRODUODENOSCOPY (EGD) WITH PROPOFOL;  Surgeon: Milus Banister, MD;   Location: [facility];  Service: Endoscopy;  Laterality: N/A;   ESOPHAGOGASTRODUODENOSCOPY (EGD) WITH PROPOFOL N/A 03/29/2022   Procedure: ESOPHAGOGASTRODUODENOSCOPY (EGD) WITH PROPOFOL;  Surgeon: Eloise Harman, DO;  [facility];  Service: Endoscopy;  Laterality: N/A;   EUS N/A 11/30/2020   Procedure: UPPER ENDOSCOPIC ULTRASOUND (EUS) RADIAL;  Surgeon: Milus Banister, MD;  Location: [facility];  Service: Endoscopy;  Laterality: N/A;   FINE NEEDLE ASPIRATION N/A 11/30/2020   Procedure: FINE NEEDLE ASPIRATION (FNA) LINEAR;  Surgeon: Milus Banister, MD;  Location: [facility];  Service: Endoscopy;  Laterality: N/A;   GIVENS CAPSULE STUDY     IR BILIARY DRAIN PLACEMENT WITH CHOLANGIOGRAM  02/04/2022   IR BILIARY STENT(S) EXISTING ACCESS INC DILATION CATH EXCHANGE  03/15/2022   IR CONVERT BILIARY DRAIN TO INT EXT BILIARY DRAIN  04/02/2022   IR IMAGING GUIDED PORT INSERTION  12/18/2020   IR PATIENT EVAL TECH 0-60 MINS  03/08/2022   KNEE SURGERY     4 surgeries on left, 2 on right knee   SHOULDER SURGERY     right shoulder   SPHINCTEROTOMY N/A 11/09/2020   Procedure: SHORT BILIARY SPHINCTEROTOMY;  Surgeon: Rogene Houston, MD;  Location: [facility];  Service: Endoscopy;  Laterality: N/A;   WHIPPLE PROCEDURE      Social History   Socioeconomic History   Marital status: Married    Spouse name: Not on file   Number of children: Not on file   Years of education: Not on file   Highest education level: Not on file  Occupational History   Not on file  Tobacco Use   Smoking status: Every Day    Types: Cigars   Smokeless tobacco: Never   Tobacco comments:    Smoke 3-4 cigars a day  Vaping Use   Vaping Use: Never used  Substance and Sexual Activity   Alcohol use: Yes    Comment: occasional   Drug use: Never   Sexual activity: Not on file  Other Topics Concern   Not on file  Social History Narrative   Not on file   Social Determinants of Health   Financial  Resource Strain: Low Risk  (04/17/2022)   Overall Financial Resource Strain (CARDIA)    Difficulty of Paying Living Expenses: Not hard at all  Food Insecurity: No Food Insecurity (04/17/2022)   Hunger Vital Sign    Worried About Running Out of Food in the Last Year: Never true    Ran Out of Food in the Last Year: Never true  Transportation Needs: No Transportation Needs (04/17/2022)   PRAPARE - Hydrologist (Medical): No    Lack of Transportation (Non-Medical): No  Physical Activity: Sufficiently Active (12/20/2020)   Exercise Vital Sign    Days of Exercise per Week: 5 days    Minutes of Exercise per Session: 30 min  Recent Concern: Physical Activity - Inactive (11/17/2020)   Exercise Vital Sign    Days of Exercise per Week: 0 days    Minutes of Exercise per Session: 0 min  Stress: No Stress Concern Present (04/17/2022)   [place]    Feeling of Stress : Only a little  Social Connections: Not on file    Family History  Problem Relation Age of Onset   Heart attack Father    Heart attack Paternal Grandfather    Leukemia Sister    Heart attack Brother     Outpatient Encounter Medications as of 05/10/2022  Medication Sig   apixaban (ELIQUIS) 5 MG TABS tablet Take 1 tablet (5 mg total) by mouth 2 (two) times daily.   azelastine (ASTELIN) 0.1 % nasal spray Place 2 sprays into both nostrils 2 (two) times daily as needed for allergies.   Continuous Blood Gluc Sensor (FREESTYLE LIBRE 3 SENSOR) MISC PLACE 1 SENSOR ON THE SKIN EVERY 14 DAYS USE TO CHECK GLUCOSE CONTINUOSLY   insulin detemir (LEVEMIR FLEXTOUCH) 100 UNIT/ML FlexPen Inject 6 Units into the skin at bedtime. (Patient not taking: Reported on 05/10/2022)   insulin lispro (HUMALOG KWIKPEN) 100 UNIT/ML KwikPen Inject 2 Units into the skin 3 (three) times daily before meals. (Patient taking differently: Inject 4-6 Units into the skin daily as  needed (if Glucose > 100).)   levothyroxine (SYNTHROID) 175 MCG tablet TAKE 1 TABLET DAILY   lipase/protease/amylase (CREON) 36000 UNITS CPEP capsule Take 2 capsules (72,000 Units total) by mouth 3 (three) times daily with meals. May also take 1 capsule (36,000 Units total) as needed (with snacks).   Oxycodone HCl 10 MG TABS 1 to 2 every 4 hours as needed pain   sodium bicarbonate 650 MG tablet Take 1  tablet (650 mg total) by mouth 2 (two) times daily.   ursodiol (ACTIGALL) 300 MG capsule Take 300 mg by mouth 3 (three) times daily.   [DISCONTINUED] enoxaparin (LOVENOX) 80 MG/0.8ML injection Inject 80 mg into the skin every 12 (twelve) hours.   [DISCONTINUED] feeding supplement (ENSURE ENLIVE / ENSURE PLUS) LIQD Take 237 mLs by mouth 2 (two) times daily between meals. (Patient not taking: Reported on 04/13/2022)   [DISCONTINUED] polyethylene glycol (MIRALAX / GLYCOLAX) 17 g packet Take 17 g by mouth daily.   [DISCONTINUED] sennosides-docusate sodium (SENOKOT-S) 8.6-50 MG tablet Take 1 tablet by mouth daily as needed for constipation.   [DISCONTINUED] sildenafil (VIAGRA) 100 MG tablet TAKE 1/2 TO 1 TABLET BY MOUTH AS NEEDED FOR ERECTILE DYSFUNCTION (Patient taking differently: Take 50-100 mg by mouth as needed for erectile dysfunction.)   Facility-Administered Encounter Medications as of 05/10/2022  Medication   0.9 %  sodium chloride infusion    ALLERGIES: Allergies  Allergen Reactions   Lisinopril Cough    VACCINATION STATUS: Immunization History  Administered Date(s) Administered   Influenza,inj,Quad PF,6+ Mos 04/15/2018, 04/09/2019, 02/28/2021   Pneumococcal Polysaccharide-23 08/10/2013   Td 06/18/2002, 11/16/2009     Hyperlipidemia This is a chronic problem. The current episode started more than 1 year ago. He has tried statins for the symptoms.  Diabetes He presents for his follow-up diabetic visit. He has type 2 diabetes mellitus. His disease course has been improving. There  are no hypoglycemic associated symptoms. Pertinent negatives for hypoglycemia include no confusion, pallor or seizures. Associated symptoms include polydipsia and polyuria. Pertinent negatives for diabetes include no polyphagia. There are no hypoglycemic complications. Risk factors for coronary artery disease include diabetes mellitus, dyslipidemia and male sex. Current diabetic treatment includes insulin injections. His weight is decreasing steadily. He is following a generally unhealthy diet. He has had a previous visit with a dietitian. His home blood glucose trend is fluctuating minimally. His breakfast blood glucose range is generally 90-110 mg/dl. His lunch blood glucose range is generally 110-130 mg/dl. His dinner blood glucose range is generally 110-130 mg/dl. His bedtime blood glucose range is generally 110-130 mg/dl. His overall blood glucose range is 110-130 mg/dl. (I had a chance to see his AGP report from his CGM.  He has 57% time range, 33% level 1 hyperglycemia, 10% level 2 hyperglycemia.  No hypoglycemia was documented. His diabetes is pancreatic diabetes status post Whipple procedure for locally invasive pancreatic cancer.  Before his last visit, he did have a relapse of his cancer with blockage of the Libre drainage.  This required placement of a drain.  Due to deterioration, patient was placed in home hospice on intermittent tube feeding.  He is not using Levemir, using low-dose Humalog sparingly.)     Review of Systems  Constitutional:  Negative for unexpected weight change.  HENT:  Negative for dental problem, mouth sores and trouble swallowing.   Eyes:  Negative for visual disturbance.  Respiratory:  Negative for choking, chest tightness, shortness of breath and wheezing.   Cardiovascular:  Negative for palpitations and leg swelling.  Gastrointestinal:  Negative for abdominal distention, constipation and diarrhea.  Endocrine: Positive for polydipsia and polyuria. Negative for  polyphagia.  Genitourinary:  Negative for dysuria, flank pain, hematuria and urgency.  Musculoskeletal:  Negative for back pain and gait problem.  Skin:  Negative for pallor and wound.  Neurological:  Negative for seizures and syncope.  Psychiatric/Behavioral:  Negative for confusion and dysphoric mood.     Objective:  05/10/2022   10:00 AM 05/08/2022    2:29 PM 05/08/2022    2:19 PM  Vitals with BMI  Height '6\' 2"'$     Weight 138 lbs    BMI 46.27    Systolic 93 80 75  Diastolic 63 59 57    BP 03/50   Ht '6\' 2"'$  (1.88 m)   Wt 138 lb (62.6 kg)   BMI 17.72 kg/m   Wt Readings from Last 3 Encounters:  05/10/22 138 lb (62.6 kg)  05/08/22 136 lb (61.7 kg)  04/13/22 151 lb 0.2 oz (68.5 kg)       CMP ( most recent) CMP     Component Value Date/Time   NA 135 04/15/2022 1000   NA 133 (L) 03/13/2022 1541   K 4.1 04/15/2022 1000   CL 99 04/15/2022 1000   CO2 27 04/15/2022 1000   GLUCOSE 150 (H) 04/15/2022 1000   BUN 16 04/15/2022 1000   BUN 34 (H) 03/13/2022 1541   CREATININE 0.95 04/15/2022 1000   CREATININE 1.10 05/31/2013 1020   CALCIUM 8.3 (L) 04/15/2022 1000   PROT 7.0 04/15/2022 1000   PROT 6.9 03/13/2022 1541   ALBUMIN 3.2 (L) 04/15/2022 1000   ALBUMIN 3.7 (L) 03/13/2022 1541   AST 15 04/15/2022 1000   ALT 14 04/15/2022 1000   ALKPHOS 84 04/15/2022 1000   BILITOT 0.8 04/15/2022 1000   BILITOT 1.6 (H) 03/13/2022 1541   GFRNONAA >60 04/15/2022 1000   GFRAA 87 06/02/2020 0812     Diabetic Labs (most recent): Lab Results  Component Value Date   HGBA1C 5.6 12/11/2021   HGBA1C 5.8 10/10/2021   HGBA1C 6.8 04/09/2021     Lipid Panel ( most recent) Lipid Panel     Component Value Date/Time   CHOL 78 (L) 07/19/2021 0937   TRIG 67 07/19/2021 0937   HDL 30 (L) 07/19/2021 0937   CHOLHDL 2.6 07/19/2021 0937   CHOLHDL 3.5 05/31/2013 1020   VLDL 23 05/31/2013 1020   LDLCALC 33 07/19/2021 0937   LABVLDL 15 07/19/2021 0937      Lab Results  Component  Value Date   TSH 9.980 (H) 10/19/2021   TSH 9.410 (H) 07/19/2021   TSH 39.931 (H) 05/31/2021   TSH 3.350 12/22/2020   TSH 0.529 11/03/2020   TSH 0.438 (L) 06/02/2020   TSH 1.070 11/24/2019   TSH 5.940 (H) 04/05/2019   TSH 0.225 (L) 10/07/2018   TSH 1.210 04/08/2018   FREET4 1.23 10/19/2021   FREET4 0.88 05/31/2021   FREET4 1.22 12/22/2020   FREET4 1.65 12/09/2016   FREET4 0.92 11/28/2014      Assessment & Plan:   1. Type 2 diabetes mellitus with stage 3a chronic kidney disease, with long-term current use of insulin ([place])   - Alexander Banks has currently uncontrolled symptomatic type 2 DM since  66 years of age.  I had a chance to see his AGP report from his CGM.  He has 57% time range, 33% level 1 hyperglycemia, 10% level 2 hyperglycemia.  No hypoglycemia was documented. His diabetes is pancreatic diabetes status post Whipple procedure for locally invasive pancreatic cancer.  Before his last visit, he did have a relapse of his cancer with blockage of the Libre drainage.  This required placement of a drain.  Due to deterioration, patient was placed in home hospice on intermittent tube feeding.  He is not using Levemir, using low-dose Humalog sparingly.  -   He will require some insulin to  control glycemia towards target.  He is using 4-6 units of Humalog with each session of tube feeding if he is blood glucose readings are above 100 mg per DL using her CGM.  I agree with this plan and advised him to continue to do so.  Priority will be to avoid hypoglycemia in this patient.   -He is benefiting from CGM.  He is advised to continue his CGM  continuously.  2) hypothyroidism: Circumstance of diagnosis is not available.  He denies any surgery or thyroid ablation.  His recent labs confirm Hashimoto's thyroiditis as a cause of his hypothyroidism.  Thyroid see will be to avoid over replacement in the patient.  I advised him to keep his levothyroxine at 175 mcg p.o. daily before breakfast.     - he is  advised to maintain close follow up with [name], MD for primary care needs, as well as his other providers for optimal and coordinated care.   I spent 21 minutes in the care of the patient today including review of his glycemic profile from CGM device, labs from Thyroid Function, CMP, and other relevant labs ; imaging/biopsy records (current and previous including abstractions from other facilities); face-to-face time discussing  his lab results and symptoms, medications doses, his options of short and long term treatment based on the latest standards of care / guidelines;   and documenting the encounter.  [name]  participated in the discussions, expressed understanding, and voiced agreement with the above plans.  All questions were answered to his satisfaction. he is encouraged to contact clinic should he have any questions or concerns prior to his return visit.    Follow up plan: - Return in about 8 weeks (around 07/05/2022), or will print out his CGM during each telephone visit, for NV with Alexa Golebiewski.  Glade Lloyd, MD [facility] [address] [place], [place] [address] Phone: [phone]  Fax: [phone]    05/10/2022, 2:39 PM  This note was partially dictated with voice recognition software. Similar sounding words can be transcribed inadequately or may not  be corrected upon review.

## 2022-05-11 DIAGNOSIS — N183 Chronic kidney disease, stage 3 unspecified: Secondary | ICD-10-CM | POA: Diagnosis not present

## 2022-05-11 DIAGNOSIS — H903 Sensorineural hearing loss, bilateral: Secondary | ICD-10-CM | POA: Diagnosis not present

## 2022-05-11 DIAGNOSIS — E118 Type 2 diabetes mellitus with unspecified complications: Secondary | ICD-10-CM | POA: Diagnosis not present

## 2022-05-11 DIAGNOSIS — E785 Hyperlipidemia, unspecified: Secondary | ICD-10-CM | POA: Diagnosis not present

## 2022-05-11 DIAGNOSIS — G4730 Sleep apnea, unspecified: Secondary | ICD-10-CM | POA: Diagnosis not present

## 2022-05-11 DIAGNOSIS — E43 Unspecified severe protein-calorie malnutrition: Secondary | ICD-10-CM | POA: Diagnosis not present

## 2022-05-11 DIAGNOSIS — R531 Weakness: Secondary | ICD-10-CM | POA: Diagnosis not present

## 2022-05-11 DIAGNOSIS — E039 Hypothyroidism, unspecified: Secondary | ICD-10-CM | POA: Diagnosis not present

## 2022-05-11 DIAGNOSIS — C259 Malignant neoplasm of pancreas, unspecified: Secondary | ICD-10-CM | POA: Diagnosis not present

## 2022-05-11 DIAGNOSIS — I10 Essential (primary) hypertension: Secondary | ICD-10-CM | POA: Diagnosis not present

## 2022-05-11 DIAGNOSIS — R42 Dizziness and giddiness: Secondary | ICD-10-CM | POA: Diagnosis not present

## 2022-05-11 DIAGNOSIS — I519 Heart disease, unspecified: Secondary | ICD-10-CM | POA: Diagnosis not present

## 2022-05-11 DIAGNOSIS — M1990 Unspecified osteoarthritis, unspecified site: Secondary | ICD-10-CM | POA: Diagnosis not present

## 2022-05-11 DIAGNOSIS — I82403 Acute embolism and thrombosis of unspecified deep veins of lower extremity, bilateral: Secondary | ICD-10-CM | POA: Diagnosis not present

## 2022-05-12 DIAGNOSIS — N183 Chronic kidney disease, stage 3 unspecified: Secondary | ICD-10-CM | POA: Diagnosis not present

## 2022-05-12 DIAGNOSIS — I519 Heart disease, unspecified: Secondary | ICD-10-CM | POA: Diagnosis not present

## 2022-05-12 DIAGNOSIS — M1990 Unspecified osteoarthritis, unspecified site: Secondary | ICD-10-CM | POA: Diagnosis not present

## 2022-05-12 DIAGNOSIS — R531 Weakness: Secondary | ICD-10-CM | POA: Diagnosis not present

## 2022-05-12 DIAGNOSIS — E785 Hyperlipidemia, unspecified: Secondary | ICD-10-CM | POA: Diagnosis not present

## 2022-05-12 DIAGNOSIS — E039 Hypothyroidism, unspecified: Secondary | ICD-10-CM | POA: Diagnosis not present

## 2022-05-12 DIAGNOSIS — C259 Malignant neoplasm of pancreas, unspecified: Secondary | ICD-10-CM | POA: Diagnosis not present

## 2022-05-12 DIAGNOSIS — R42 Dizziness and giddiness: Secondary | ICD-10-CM | POA: Diagnosis not present

## 2022-05-12 DIAGNOSIS — I10 Essential (primary) hypertension: Secondary | ICD-10-CM | POA: Diagnosis not present

## 2022-05-12 DIAGNOSIS — E118 Type 2 diabetes mellitus with unspecified complications: Secondary | ICD-10-CM | POA: Diagnosis not present

## 2022-05-12 DIAGNOSIS — H903 Sensorineural hearing loss, bilateral: Secondary | ICD-10-CM | POA: Diagnosis not present

## 2022-05-12 DIAGNOSIS — E43 Unspecified severe protein-calorie malnutrition: Secondary | ICD-10-CM | POA: Diagnosis not present

## 2022-05-12 DIAGNOSIS — G4730 Sleep apnea, unspecified: Secondary | ICD-10-CM | POA: Diagnosis not present

## 2022-05-12 DIAGNOSIS — I82403 Acute embolism and thrombosis of unspecified deep veins of lower extremity, bilateral: Secondary | ICD-10-CM | POA: Diagnosis not present

## 2022-05-13 DIAGNOSIS — E785 Hyperlipidemia, unspecified: Secondary | ICD-10-CM | POA: Diagnosis not present

## 2022-05-13 DIAGNOSIS — R531 Weakness: Secondary | ICD-10-CM | POA: Diagnosis not present

## 2022-05-13 DIAGNOSIS — E039 Hypothyroidism, unspecified: Secondary | ICD-10-CM | POA: Diagnosis not present

## 2022-05-13 DIAGNOSIS — M1990 Unspecified osteoarthritis, unspecified site: Secondary | ICD-10-CM | POA: Diagnosis not present

## 2022-05-13 DIAGNOSIS — E118 Type 2 diabetes mellitus with unspecified complications: Secondary | ICD-10-CM | POA: Diagnosis not present

## 2022-05-13 DIAGNOSIS — N183 Chronic kidney disease, stage 3 unspecified: Secondary | ICD-10-CM | POA: Diagnosis not present

## 2022-05-13 DIAGNOSIS — I10 Essential (primary) hypertension: Secondary | ICD-10-CM | POA: Diagnosis not present

## 2022-05-13 DIAGNOSIS — C259 Malignant neoplasm of pancreas, unspecified: Secondary | ICD-10-CM | POA: Diagnosis not present

## 2022-05-13 DIAGNOSIS — G4730 Sleep apnea, unspecified: Secondary | ICD-10-CM | POA: Diagnosis not present

## 2022-05-13 DIAGNOSIS — R42 Dizziness and giddiness: Secondary | ICD-10-CM | POA: Diagnosis not present

## 2022-05-13 DIAGNOSIS — E43 Unspecified severe protein-calorie malnutrition: Secondary | ICD-10-CM | POA: Diagnosis not present

## 2022-05-13 DIAGNOSIS — I82403 Acute embolism and thrombosis of unspecified deep veins of lower extremity, bilateral: Secondary | ICD-10-CM | POA: Diagnosis not present

## 2022-05-13 DIAGNOSIS — I519 Heart disease, unspecified: Secondary | ICD-10-CM | POA: Diagnosis not present

## 2022-05-13 DIAGNOSIS — H903 Sensorineural hearing loss, bilateral: Secondary | ICD-10-CM | POA: Diagnosis not present

## 2022-05-14 DIAGNOSIS — M1990 Unspecified osteoarthritis, unspecified site: Secondary | ICD-10-CM | POA: Diagnosis not present

## 2022-05-14 DIAGNOSIS — N183 Chronic kidney disease, stage 3 unspecified: Secondary | ICD-10-CM | POA: Diagnosis not present

## 2022-05-14 DIAGNOSIS — G4730 Sleep apnea, unspecified: Secondary | ICD-10-CM | POA: Diagnosis not present

## 2022-05-14 DIAGNOSIS — E785 Hyperlipidemia, unspecified: Secondary | ICD-10-CM | POA: Diagnosis not present

## 2022-05-14 DIAGNOSIS — R42 Dizziness and giddiness: Secondary | ICD-10-CM | POA: Diagnosis not present

## 2022-05-14 DIAGNOSIS — I519 Heart disease, unspecified: Secondary | ICD-10-CM | POA: Diagnosis not present

## 2022-05-14 DIAGNOSIS — R531 Weakness: Secondary | ICD-10-CM | POA: Diagnosis not present

## 2022-05-14 DIAGNOSIS — I10 Essential (primary) hypertension: Secondary | ICD-10-CM | POA: Diagnosis not present

## 2022-05-14 DIAGNOSIS — H903 Sensorineural hearing loss, bilateral: Secondary | ICD-10-CM | POA: Diagnosis not present

## 2022-05-14 DIAGNOSIS — I82403 Acute embolism and thrombosis of unspecified deep veins of lower extremity, bilateral: Secondary | ICD-10-CM | POA: Diagnosis not present

## 2022-05-14 DIAGNOSIS — E43 Unspecified severe protein-calorie malnutrition: Secondary | ICD-10-CM | POA: Diagnosis not present

## 2022-05-14 DIAGNOSIS — E118 Type 2 diabetes mellitus with unspecified complications: Secondary | ICD-10-CM | POA: Diagnosis not present

## 2022-05-14 DIAGNOSIS — E039 Hypothyroidism, unspecified: Secondary | ICD-10-CM | POA: Diagnosis not present

## 2022-05-14 DIAGNOSIS — C259 Malignant neoplasm of pancreas, unspecified: Secondary | ICD-10-CM | POA: Diagnosis not present

## 2022-05-15 DIAGNOSIS — E039 Hypothyroidism, unspecified: Secondary | ICD-10-CM | POA: Diagnosis not present

## 2022-05-15 DIAGNOSIS — M1990 Unspecified osteoarthritis, unspecified site: Secondary | ICD-10-CM | POA: Diagnosis not present

## 2022-05-15 DIAGNOSIS — R531 Weakness: Secondary | ICD-10-CM | POA: Diagnosis not present

## 2022-05-15 DIAGNOSIS — E118 Type 2 diabetes mellitus with unspecified complications: Secondary | ICD-10-CM | POA: Diagnosis not present

## 2022-05-15 DIAGNOSIS — C259 Malignant neoplasm of pancreas, unspecified: Secondary | ICD-10-CM | POA: Diagnosis not present

## 2022-05-15 DIAGNOSIS — R42 Dizziness and giddiness: Secondary | ICD-10-CM | POA: Diagnosis not present

## 2022-05-15 DIAGNOSIS — E43 Unspecified severe protein-calorie malnutrition: Secondary | ICD-10-CM | POA: Diagnosis not present

## 2022-05-15 DIAGNOSIS — N183 Chronic kidney disease, stage 3 unspecified: Secondary | ICD-10-CM | POA: Diagnosis not present

## 2022-05-15 DIAGNOSIS — E785 Hyperlipidemia, unspecified: Secondary | ICD-10-CM | POA: Diagnosis not present

## 2022-05-15 DIAGNOSIS — G4730 Sleep apnea, unspecified: Secondary | ICD-10-CM | POA: Diagnosis not present

## 2022-05-15 DIAGNOSIS — I82403 Acute embolism and thrombosis of unspecified deep veins of lower extremity, bilateral: Secondary | ICD-10-CM | POA: Diagnosis not present

## 2022-05-15 DIAGNOSIS — I519 Heart disease, unspecified: Secondary | ICD-10-CM | POA: Diagnosis not present

## 2022-05-15 DIAGNOSIS — I10 Essential (primary) hypertension: Secondary | ICD-10-CM | POA: Diagnosis not present

## 2022-05-15 DIAGNOSIS — H903 Sensorineural hearing loss, bilateral: Secondary | ICD-10-CM | POA: Diagnosis not present

## 2022-05-16 ENCOUNTER — Other Ambulatory Visit: Payer: Self-pay | Admitting: Family Medicine

## 2022-05-16 ENCOUNTER — Other Ambulatory Visit: Payer: Self-pay

## 2022-05-16 DIAGNOSIS — E785 Hyperlipidemia, unspecified: Secondary | ICD-10-CM | POA: Diagnosis not present

## 2022-05-16 DIAGNOSIS — H903 Sensorineural hearing loss, bilateral: Secondary | ICD-10-CM | POA: Diagnosis not present

## 2022-05-16 DIAGNOSIS — I10 Essential (primary) hypertension: Secondary | ICD-10-CM | POA: Diagnosis not present

## 2022-05-16 DIAGNOSIS — E039 Hypothyroidism, unspecified: Secondary | ICD-10-CM | POA: Diagnosis not present

## 2022-05-16 DIAGNOSIS — C259 Malignant neoplasm of pancreas, unspecified: Secondary | ICD-10-CM | POA: Diagnosis not present

## 2022-05-16 DIAGNOSIS — E118 Type 2 diabetes mellitus with unspecified complications: Secondary | ICD-10-CM | POA: Diagnosis not present

## 2022-05-16 DIAGNOSIS — G4730 Sleep apnea, unspecified: Secondary | ICD-10-CM | POA: Diagnosis not present

## 2022-05-16 DIAGNOSIS — R531 Weakness: Secondary | ICD-10-CM | POA: Diagnosis not present

## 2022-05-16 DIAGNOSIS — I82403 Acute embolism and thrombosis of unspecified deep veins of lower extremity, bilateral: Secondary | ICD-10-CM | POA: Diagnosis not present

## 2022-05-16 DIAGNOSIS — M1990 Unspecified osteoarthritis, unspecified site: Secondary | ICD-10-CM | POA: Diagnosis not present

## 2022-05-16 DIAGNOSIS — R42 Dizziness and giddiness: Secondary | ICD-10-CM | POA: Diagnosis not present

## 2022-05-16 DIAGNOSIS — N183 Chronic kidney disease, stage 3 unspecified: Secondary | ICD-10-CM | POA: Diagnosis not present

## 2022-05-16 DIAGNOSIS — E43 Unspecified severe protein-calorie malnutrition: Secondary | ICD-10-CM | POA: Diagnosis not present

## 2022-05-16 DIAGNOSIS — I519 Heart disease, unspecified: Secondary | ICD-10-CM | POA: Diagnosis not present

## 2022-05-16 MED ORDER — OXYCODONE HCL 10 MG PO TABS: ORAL_TABLET | ORAL | 0 refills | Status: DC

## 2022-05-16 MED ORDER — FENTANYL 12 MCG/HR TD PT72: 1.0000 | MEDICATED_PATCH | TRANSDERMAL | 0 refills | Status: DC

## 2022-05-16 NOTE — Progress Notes (Signed)
Patient has pancreatic cancer in stage under hospice care.  Hospice nurse Anderson Malta called to state that the patient is having increased pain and discomfort in oxycodone 1-2 every 4 hours is not adequately taking care of the pain.  We did discuss options family has already been discussed with in regards to fentanyl patches and at this point they are okay with progressing according to the nurse.  Therefore fentanyl 12.5 mg patch apply every 3 days May use oxycodone as needed for breakthrough pain caution drowsiness  Prescription for 50 tablets of the oxycodone as well as fentanyl patch was sent they will keep [place] updated on a weekly basis more often if necessary

## 2022-05-17 DIAGNOSIS — H903 Sensorineural hearing loss, bilateral: Secondary | ICD-10-CM | POA: Diagnosis not present

## 2022-05-17 DIAGNOSIS — E43 Unspecified severe protein-calorie malnutrition: Secondary | ICD-10-CM | POA: Diagnosis not present

## 2022-05-17 DIAGNOSIS — E118 Type 2 diabetes mellitus with unspecified complications: Secondary | ICD-10-CM | POA: Diagnosis not present

## 2022-05-17 DIAGNOSIS — E039 Hypothyroidism, unspecified: Secondary | ICD-10-CM | POA: Diagnosis not present

## 2022-05-17 DIAGNOSIS — M1990 Unspecified osteoarthritis, unspecified site: Secondary | ICD-10-CM | POA: Diagnosis not present

## 2022-05-17 DIAGNOSIS — I519 Heart disease, unspecified: Secondary | ICD-10-CM | POA: Diagnosis not present

## 2022-05-17 DIAGNOSIS — I82403 Acute embolism and thrombosis of unspecified deep veins of lower extremity, bilateral: Secondary | ICD-10-CM | POA: Diagnosis not present

## 2022-05-17 DIAGNOSIS — R42 Dizziness and giddiness: Secondary | ICD-10-CM | POA: Diagnosis not present

## 2022-05-17 DIAGNOSIS — E785 Hyperlipidemia, unspecified: Secondary | ICD-10-CM | POA: Diagnosis not present

## 2022-05-17 DIAGNOSIS — I10 Essential (primary) hypertension: Secondary | ICD-10-CM | POA: Diagnosis not present

## 2022-05-17 DIAGNOSIS — G4730 Sleep apnea, unspecified: Secondary | ICD-10-CM | POA: Diagnosis not present

## 2022-05-17 DIAGNOSIS — R531 Weakness: Secondary | ICD-10-CM | POA: Diagnosis not present

## 2022-05-17 DIAGNOSIS — N183 Chronic kidney disease, stage 3 unspecified: Secondary | ICD-10-CM | POA: Diagnosis not present

## 2022-05-17 DIAGNOSIS — C259 Malignant neoplasm of pancreas, unspecified: Secondary | ICD-10-CM | POA: Diagnosis not present

## 2022-05-18 DIAGNOSIS — E039 Hypothyroidism, unspecified: Secondary | ICD-10-CM | POA: Diagnosis not present

## 2022-05-18 DIAGNOSIS — E118 Type 2 diabetes mellitus with unspecified complications: Secondary | ICD-10-CM | POA: Diagnosis not present

## 2022-05-18 DIAGNOSIS — G4730 Sleep apnea, unspecified: Secondary | ICD-10-CM | POA: Diagnosis not present

## 2022-05-18 DIAGNOSIS — H903 Sensorineural hearing loss, bilateral: Secondary | ICD-10-CM | POA: Diagnosis not present

## 2022-05-18 DIAGNOSIS — E785 Hyperlipidemia, unspecified: Secondary | ICD-10-CM | POA: Diagnosis not present

## 2022-05-18 DIAGNOSIS — E43 Unspecified severe protein-calorie malnutrition: Secondary | ICD-10-CM | POA: Diagnosis not present

## 2022-05-18 DIAGNOSIS — R531 Weakness: Secondary | ICD-10-CM | POA: Diagnosis not present

## 2022-05-18 DIAGNOSIS — R42 Dizziness and giddiness: Secondary | ICD-10-CM | POA: Diagnosis not present

## 2022-05-18 DIAGNOSIS — I82403 Acute embolism and thrombosis of unspecified deep veins of lower extremity, bilateral: Secondary | ICD-10-CM | POA: Diagnosis not present

## 2022-05-18 DIAGNOSIS — N183 Chronic kidney disease, stage 3 unspecified: Secondary | ICD-10-CM | POA: Diagnosis not present

## 2022-05-18 DIAGNOSIS — I10 Essential (primary) hypertension: Secondary | ICD-10-CM | POA: Diagnosis not present

## 2022-05-18 DIAGNOSIS — C259 Malignant neoplasm of pancreas, unspecified: Secondary | ICD-10-CM | POA: Diagnosis not present

## 2022-05-18 DIAGNOSIS — M1990 Unspecified osteoarthritis, unspecified site: Secondary | ICD-10-CM | POA: Diagnosis not present

## 2022-05-18 DIAGNOSIS — I519 Heart disease, unspecified: Secondary | ICD-10-CM | POA: Diagnosis not present

## 2022-05-19 DIAGNOSIS — E785 Hyperlipidemia, unspecified: Secondary | ICD-10-CM | POA: Diagnosis not present

## 2022-05-19 DIAGNOSIS — E43 Unspecified severe protein-calorie malnutrition: Secondary | ICD-10-CM | POA: Diagnosis not present

## 2022-05-19 DIAGNOSIS — G4730 Sleep apnea, unspecified: Secondary | ICD-10-CM | POA: Diagnosis not present

## 2022-05-19 DIAGNOSIS — N183 Chronic kidney disease, stage 3 unspecified: Secondary | ICD-10-CM | POA: Diagnosis not present

## 2022-05-19 DIAGNOSIS — M1990 Unspecified osteoarthritis, unspecified site: Secondary | ICD-10-CM | POA: Diagnosis not present

## 2022-05-19 DIAGNOSIS — I82403 Acute embolism and thrombosis of unspecified deep veins of lower extremity, bilateral: Secondary | ICD-10-CM | POA: Diagnosis not present

## 2022-05-19 DIAGNOSIS — C259 Malignant neoplasm of pancreas, unspecified: Secondary | ICD-10-CM | POA: Diagnosis not present

## 2022-05-19 DIAGNOSIS — E039 Hypothyroidism, unspecified: Secondary | ICD-10-CM | POA: Diagnosis not present

## 2022-05-19 DIAGNOSIS — H903 Sensorineural hearing loss, bilateral: Secondary | ICD-10-CM | POA: Diagnosis not present

## 2022-05-19 DIAGNOSIS — R42 Dizziness and giddiness: Secondary | ICD-10-CM | POA: Diagnosis not present

## 2022-05-19 DIAGNOSIS — I10 Essential (primary) hypertension: Secondary | ICD-10-CM | POA: Diagnosis not present

## 2022-05-19 DIAGNOSIS — I519 Heart disease, unspecified: Secondary | ICD-10-CM | POA: Diagnosis not present

## 2022-05-19 DIAGNOSIS — E118 Type 2 diabetes mellitus with unspecified complications: Secondary | ICD-10-CM | POA: Diagnosis not present

## 2022-05-19 DIAGNOSIS — R531 Weakness: Secondary | ICD-10-CM | POA: Diagnosis not present

## 2022-05-20 DIAGNOSIS — I519 Heart disease, unspecified: Secondary | ICD-10-CM | POA: Diagnosis not present

## 2022-05-20 DIAGNOSIS — C259 Malignant neoplasm of pancreas, unspecified: Secondary | ICD-10-CM | POA: Diagnosis not present

## 2022-05-20 DIAGNOSIS — N183 Chronic kidney disease, stage 3 unspecified: Secondary | ICD-10-CM | POA: Diagnosis not present

## 2022-05-20 DIAGNOSIS — R531 Weakness: Secondary | ICD-10-CM | POA: Diagnosis not present

## 2022-05-20 DIAGNOSIS — E785 Hyperlipidemia, unspecified: Secondary | ICD-10-CM | POA: Diagnosis not present

## 2022-05-20 DIAGNOSIS — E43 Unspecified severe protein-calorie malnutrition: Secondary | ICD-10-CM | POA: Diagnosis not present

## 2022-05-20 DIAGNOSIS — E118 Type 2 diabetes mellitus with unspecified complications: Secondary | ICD-10-CM | POA: Diagnosis not present

## 2022-05-20 DIAGNOSIS — M1990 Unspecified osteoarthritis, unspecified site: Secondary | ICD-10-CM | POA: Diagnosis not present

## 2022-05-20 DIAGNOSIS — I10 Essential (primary) hypertension: Secondary | ICD-10-CM | POA: Diagnosis not present

## 2022-05-20 DIAGNOSIS — H903 Sensorineural hearing loss, bilateral: Secondary | ICD-10-CM | POA: Diagnosis not present

## 2022-05-20 DIAGNOSIS — I82403 Acute embolism and thrombosis of unspecified deep veins of lower extremity, bilateral: Secondary | ICD-10-CM | POA: Diagnosis not present

## 2022-05-20 DIAGNOSIS — R42 Dizziness and giddiness: Secondary | ICD-10-CM | POA: Diagnosis not present

## 2022-05-20 DIAGNOSIS — G4730 Sleep apnea, unspecified: Secondary | ICD-10-CM | POA: Diagnosis not present

## 2022-05-20 DIAGNOSIS — E039 Hypothyroidism, unspecified: Secondary | ICD-10-CM | POA: Diagnosis not present

## 2022-05-21 DIAGNOSIS — E039 Hypothyroidism, unspecified: Secondary | ICD-10-CM | POA: Diagnosis not present

## 2022-05-21 DIAGNOSIS — G4730 Sleep apnea, unspecified: Secondary | ICD-10-CM | POA: Diagnosis not present

## 2022-05-21 DIAGNOSIS — R531 Weakness: Secondary | ICD-10-CM | POA: Diagnosis not present

## 2022-05-21 DIAGNOSIS — N183 Chronic kidney disease, stage 3 unspecified: Secondary | ICD-10-CM | POA: Diagnosis not present

## 2022-05-21 DIAGNOSIS — I82403 Acute embolism and thrombosis of unspecified deep veins of lower extremity, bilateral: Secondary | ICD-10-CM | POA: Diagnosis not present

## 2022-05-21 DIAGNOSIS — C259 Malignant neoplasm of pancreas, unspecified: Secondary | ICD-10-CM | POA: Diagnosis not present

## 2022-05-21 DIAGNOSIS — E43 Unspecified severe protein-calorie malnutrition: Secondary | ICD-10-CM | POA: Diagnosis not present

## 2022-05-21 DIAGNOSIS — E118 Type 2 diabetes mellitus with unspecified complications: Secondary | ICD-10-CM | POA: Diagnosis not present

## 2022-05-21 DIAGNOSIS — H903 Sensorineural hearing loss, bilateral: Secondary | ICD-10-CM | POA: Diagnosis not present

## 2022-05-21 DIAGNOSIS — M1990 Unspecified osteoarthritis, unspecified site: Secondary | ICD-10-CM | POA: Diagnosis not present

## 2022-05-21 DIAGNOSIS — R42 Dizziness and giddiness: Secondary | ICD-10-CM | POA: Diagnosis not present

## 2022-05-21 DIAGNOSIS — E785 Hyperlipidemia, unspecified: Secondary | ICD-10-CM | POA: Diagnosis not present

## 2022-05-21 DIAGNOSIS — I10 Essential (primary) hypertension: Secondary | ICD-10-CM | POA: Diagnosis not present

## 2022-05-21 DIAGNOSIS — I519 Heart disease, unspecified: Secondary | ICD-10-CM | POA: Diagnosis not present

## 2022-05-22 DIAGNOSIS — I519 Heart disease, unspecified: Secondary | ICD-10-CM | POA: Diagnosis not present

## 2022-05-22 DIAGNOSIS — E118 Type 2 diabetes mellitus with unspecified complications: Secondary | ICD-10-CM | POA: Diagnosis not present

## 2022-05-22 DIAGNOSIS — E039 Hypothyroidism, unspecified: Secondary | ICD-10-CM | POA: Diagnosis not present

## 2022-05-22 DIAGNOSIS — I10 Essential (primary) hypertension: Secondary | ICD-10-CM | POA: Diagnosis not present

## 2022-05-22 DIAGNOSIS — C259 Malignant neoplasm of pancreas, unspecified: Secondary | ICD-10-CM | POA: Diagnosis not present

## 2022-05-22 DIAGNOSIS — H903 Sensorineural hearing loss, bilateral: Secondary | ICD-10-CM | POA: Diagnosis not present

## 2022-05-22 DIAGNOSIS — N183 Chronic kidney disease, stage 3 unspecified: Secondary | ICD-10-CM | POA: Diagnosis not present

## 2022-05-22 DIAGNOSIS — R42 Dizziness and giddiness: Secondary | ICD-10-CM | POA: Diagnosis not present

## 2022-05-22 DIAGNOSIS — M1990 Unspecified osteoarthritis, unspecified site: Secondary | ICD-10-CM | POA: Diagnosis not present

## 2022-05-22 DIAGNOSIS — E43 Unspecified severe protein-calorie malnutrition: Secondary | ICD-10-CM | POA: Diagnosis not present

## 2022-05-22 DIAGNOSIS — E785 Hyperlipidemia, unspecified: Secondary | ICD-10-CM | POA: Diagnosis not present

## 2022-05-22 DIAGNOSIS — I82403 Acute embolism and thrombosis of unspecified deep veins of lower extremity, bilateral: Secondary | ICD-10-CM | POA: Diagnosis not present

## 2022-05-22 DIAGNOSIS — G4730 Sleep apnea, unspecified: Secondary | ICD-10-CM | POA: Diagnosis not present

## 2022-05-22 DIAGNOSIS — R531 Weakness: Secondary | ICD-10-CM | POA: Diagnosis not present

## 2022-05-23 DIAGNOSIS — R42 Dizziness and giddiness: Secondary | ICD-10-CM | POA: Diagnosis not present

## 2022-05-23 DIAGNOSIS — H903 Sensorineural hearing loss, bilateral: Secondary | ICD-10-CM | POA: Diagnosis not present

## 2022-05-23 DIAGNOSIS — I82403 Acute embolism and thrombosis of unspecified deep veins of lower extremity, bilateral: Secondary | ICD-10-CM | POA: Diagnosis not present

## 2022-05-23 DIAGNOSIS — E785 Hyperlipidemia, unspecified: Secondary | ICD-10-CM | POA: Diagnosis not present

## 2022-05-23 DIAGNOSIS — E43 Unspecified severe protein-calorie malnutrition: Secondary | ICD-10-CM | POA: Diagnosis not present

## 2022-05-23 DIAGNOSIS — G4730 Sleep apnea, unspecified: Secondary | ICD-10-CM | POA: Diagnosis not present

## 2022-05-23 DIAGNOSIS — I10 Essential (primary) hypertension: Secondary | ICD-10-CM | POA: Diagnosis not present

## 2022-05-23 DIAGNOSIS — R531 Weakness: Secondary | ICD-10-CM | POA: Diagnosis not present

## 2022-05-23 DIAGNOSIS — C259 Malignant neoplasm of pancreas, unspecified: Secondary | ICD-10-CM | POA: Diagnosis not present

## 2022-05-23 DIAGNOSIS — M1990 Unspecified osteoarthritis, unspecified site: Secondary | ICD-10-CM | POA: Diagnosis not present

## 2022-05-23 DIAGNOSIS — I519 Heart disease, unspecified: Secondary | ICD-10-CM | POA: Diagnosis not present

## 2022-05-23 DIAGNOSIS — N183 Chronic kidney disease, stage 3 unspecified: Secondary | ICD-10-CM | POA: Diagnosis not present

## 2022-05-23 DIAGNOSIS — E039 Hypothyroidism, unspecified: Secondary | ICD-10-CM | POA: Diagnosis not present

## 2022-05-23 DIAGNOSIS — E118 Type 2 diabetes mellitus with unspecified complications: Secondary | ICD-10-CM | POA: Diagnosis not present

## 2022-05-24 ENCOUNTER — Other Ambulatory Visit: Payer: Self-pay | Admitting: Family Medicine

## 2022-05-24 DIAGNOSIS — N183 Chronic kidney disease, stage 3 unspecified: Secondary | ICD-10-CM | POA: Diagnosis not present

## 2022-05-24 DIAGNOSIS — C259 Malignant neoplasm of pancreas, unspecified: Secondary | ICD-10-CM | POA: Diagnosis not present

## 2022-05-24 DIAGNOSIS — I82403 Acute embolism and thrombosis of unspecified deep veins of lower extremity, bilateral: Secondary | ICD-10-CM | POA: Diagnosis not present

## 2022-05-24 DIAGNOSIS — I10 Essential (primary) hypertension: Secondary | ICD-10-CM | POA: Diagnosis not present

## 2022-05-24 DIAGNOSIS — E43 Unspecified severe protein-calorie malnutrition: Secondary | ICD-10-CM | POA: Diagnosis not present

## 2022-05-24 DIAGNOSIS — R531 Weakness: Secondary | ICD-10-CM | POA: Diagnosis not present

## 2022-05-24 DIAGNOSIS — G4730 Sleep apnea, unspecified: Secondary | ICD-10-CM | POA: Diagnosis not present

## 2022-05-24 DIAGNOSIS — R42 Dizziness and giddiness: Secondary | ICD-10-CM | POA: Diagnosis not present

## 2022-05-24 DIAGNOSIS — E118 Type 2 diabetes mellitus with unspecified complications: Secondary | ICD-10-CM | POA: Diagnosis not present

## 2022-05-24 DIAGNOSIS — H903 Sensorineural hearing loss, bilateral: Secondary | ICD-10-CM | POA: Diagnosis not present

## 2022-05-24 DIAGNOSIS — I519 Heart disease, unspecified: Secondary | ICD-10-CM | POA: Diagnosis not present

## 2022-05-24 DIAGNOSIS — E785 Hyperlipidemia, unspecified: Secondary | ICD-10-CM | POA: Diagnosis not present

## 2022-05-24 DIAGNOSIS — M1990 Unspecified osteoarthritis, unspecified site: Secondary | ICD-10-CM | POA: Diagnosis not present

## 2022-05-24 DIAGNOSIS — E039 Hypothyroidism, unspecified: Secondary | ICD-10-CM | POA: Diagnosis not present

## 2022-05-24 MED ORDER — FENTANYL 12 MCG/HR TD PT72: 1.0000 | MEDICATED_PATCH | TRANSDERMAL | 0 refills | Status: DC

## 2022-05-24 MED ORDER — OXYCODONE HCL 10 MG PO TABS: ORAL_TABLET | ORAL | 0 refills | Status: DC

## 2022-05-25 DIAGNOSIS — N183 Chronic kidney disease, stage 3 unspecified: Secondary | ICD-10-CM | POA: Diagnosis not present

## 2022-05-25 DIAGNOSIS — C259 Malignant neoplasm of pancreas, unspecified: Secondary | ICD-10-CM | POA: Diagnosis not present

## 2022-05-25 DIAGNOSIS — R42 Dizziness and giddiness: Secondary | ICD-10-CM | POA: Diagnosis not present

## 2022-05-25 DIAGNOSIS — I519 Heart disease, unspecified: Secondary | ICD-10-CM | POA: Diagnosis not present

## 2022-05-25 DIAGNOSIS — I10 Essential (primary) hypertension: Secondary | ICD-10-CM | POA: Diagnosis not present

## 2022-05-25 DIAGNOSIS — E43 Unspecified severe protein-calorie malnutrition: Secondary | ICD-10-CM | POA: Diagnosis not present

## 2022-05-25 DIAGNOSIS — M1990 Unspecified osteoarthritis, unspecified site: Secondary | ICD-10-CM | POA: Diagnosis not present

## 2022-05-25 DIAGNOSIS — I82403 Acute embolism and thrombosis of unspecified deep veins of lower extremity, bilateral: Secondary | ICD-10-CM | POA: Diagnosis not present

## 2022-05-25 DIAGNOSIS — E039 Hypothyroidism, unspecified: Secondary | ICD-10-CM | POA: Diagnosis not present

## 2022-05-25 DIAGNOSIS — G4730 Sleep apnea, unspecified: Secondary | ICD-10-CM | POA: Diagnosis not present

## 2022-05-25 DIAGNOSIS — E118 Type 2 diabetes mellitus with unspecified complications: Secondary | ICD-10-CM | POA: Diagnosis not present

## 2022-05-25 DIAGNOSIS — R531 Weakness: Secondary | ICD-10-CM | POA: Diagnosis not present

## 2022-05-25 DIAGNOSIS — H903 Sensorineural hearing loss, bilateral: Secondary | ICD-10-CM | POA: Diagnosis not present

## 2022-05-25 DIAGNOSIS — E785 Hyperlipidemia, unspecified: Secondary | ICD-10-CM | POA: Diagnosis not present

## 2022-05-26 DIAGNOSIS — E039 Hypothyroidism, unspecified: Secondary | ICD-10-CM | POA: Diagnosis not present

## 2022-05-26 DIAGNOSIS — I10 Essential (primary) hypertension: Secondary | ICD-10-CM | POA: Diagnosis not present

## 2022-05-26 DIAGNOSIS — R42 Dizziness and giddiness: Secondary | ICD-10-CM | POA: Diagnosis not present

## 2022-05-26 DIAGNOSIS — I519 Heart disease, unspecified: Secondary | ICD-10-CM | POA: Diagnosis not present

## 2022-05-26 DIAGNOSIS — R531 Weakness: Secondary | ICD-10-CM | POA: Diagnosis not present

## 2022-05-26 DIAGNOSIS — M1990 Unspecified osteoarthritis, unspecified site: Secondary | ICD-10-CM | POA: Diagnosis not present

## 2022-05-26 DIAGNOSIS — C259 Malignant neoplasm of pancreas, unspecified: Secondary | ICD-10-CM | POA: Diagnosis not present

## 2022-05-26 DIAGNOSIS — G4730 Sleep apnea, unspecified: Secondary | ICD-10-CM | POA: Diagnosis not present

## 2022-05-26 DIAGNOSIS — H903 Sensorineural hearing loss, bilateral: Secondary | ICD-10-CM | POA: Diagnosis not present

## 2022-05-26 DIAGNOSIS — E118 Type 2 diabetes mellitus with unspecified complications: Secondary | ICD-10-CM | POA: Diagnosis not present

## 2022-05-26 DIAGNOSIS — E785 Hyperlipidemia, unspecified: Secondary | ICD-10-CM | POA: Diagnosis not present

## 2022-05-26 DIAGNOSIS — N183 Chronic kidney disease, stage 3 unspecified: Secondary | ICD-10-CM | POA: Diagnosis not present

## 2022-05-26 DIAGNOSIS — E43 Unspecified severe protein-calorie malnutrition: Secondary | ICD-10-CM | POA: Diagnosis not present

## 2022-05-26 DIAGNOSIS — I82403 Acute embolism and thrombosis of unspecified deep veins of lower extremity, bilateral: Secondary | ICD-10-CM | POA: Diagnosis not present

## 2022-05-27 ENCOUNTER — Encounter: Payer: Self-pay | Admitting: *Deleted

## 2022-05-27 DIAGNOSIS — R531 Weakness: Secondary | ICD-10-CM | POA: Diagnosis not present

## 2022-05-27 DIAGNOSIS — I519 Heart disease, unspecified: Secondary | ICD-10-CM | POA: Diagnosis not present

## 2022-05-27 DIAGNOSIS — I10 Essential (primary) hypertension: Secondary | ICD-10-CM | POA: Diagnosis not present

## 2022-05-27 DIAGNOSIS — C259 Malignant neoplasm of pancreas, unspecified: Secondary | ICD-10-CM | POA: Diagnosis not present

## 2022-05-27 DIAGNOSIS — R42 Dizziness and giddiness: Secondary | ICD-10-CM | POA: Diagnosis not present

## 2022-05-27 DIAGNOSIS — E43 Unspecified severe protein-calorie malnutrition: Secondary | ICD-10-CM | POA: Diagnosis not present

## 2022-05-27 DIAGNOSIS — H903 Sensorineural hearing loss, bilateral: Secondary | ICD-10-CM | POA: Diagnosis not present

## 2022-05-27 DIAGNOSIS — I82403 Acute embolism and thrombosis of unspecified deep veins of lower extremity, bilateral: Secondary | ICD-10-CM | POA: Diagnosis not present

## 2022-05-27 DIAGNOSIS — E118 Type 2 diabetes mellitus with unspecified complications: Secondary | ICD-10-CM | POA: Diagnosis not present

## 2022-05-27 DIAGNOSIS — G4730 Sleep apnea, unspecified: Secondary | ICD-10-CM | POA: Diagnosis not present

## 2022-05-27 DIAGNOSIS — E039 Hypothyroidism, unspecified: Secondary | ICD-10-CM | POA: Diagnosis not present

## 2022-05-27 DIAGNOSIS — N183 Chronic kidney disease, stage 3 unspecified: Secondary | ICD-10-CM | POA: Diagnosis not present

## 2022-05-27 DIAGNOSIS — M1990 Unspecified osteoarthritis, unspecified site: Secondary | ICD-10-CM | POA: Diagnosis not present

## 2022-05-27 DIAGNOSIS — E785 Hyperlipidemia, unspecified: Secondary | ICD-10-CM | POA: Diagnosis not present

## 2022-05-28 ENCOUNTER — Other Ambulatory Visit: Payer: Self-pay | Admitting: *Deleted

## 2022-05-28 ENCOUNTER — Other Ambulatory Visit: Payer: Self-pay | Admitting: Family Medicine

## 2022-05-28 DIAGNOSIS — G4730 Sleep apnea, unspecified: Secondary | ICD-10-CM | POA: Diagnosis not present

## 2022-05-28 DIAGNOSIS — N183 Chronic kidney disease, stage 3 unspecified: Secondary | ICD-10-CM | POA: Diagnosis not present

## 2022-05-28 DIAGNOSIS — R42 Dizziness and giddiness: Secondary | ICD-10-CM | POA: Diagnosis not present

## 2022-05-28 DIAGNOSIS — M1990 Unspecified osteoarthritis, unspecified site: Secondary | ICD-10-CM | POA: Diagnosis not present

## 2022-05-28 DIAGNOSIS — H903 Sensorineural hearing loss, bilateral: Secondary | ICD-10-CM | POA: Diagnosis not present

## 2022-05-28 DIAGNOSIS — E43 Unspecified severe protein-calorie malnutrition: Secondary | ICD-10-CM | POA: Diagnosis not present

## 2022-05-28 DIAGNOSIS — E118 Type 2 diabetes mellitus with unspecified complications: Secondary | ICD-10-CM | POA: Diagnosis not present

## 2022-05-28 DIAGNOSIS — I82403 Acute embolism and thrombosis of unspecified deep veins of lower extremity, bilateral: Secondary | ICD-10-CM | POA: Diagnosis not present

## 2022-05-28 DIAGNOSIS — C259 Malignant neoplasm of pancreas, unspecified: Secondary | ICD-10-CM | POA: Diagnosis not present

## 2022-05-28 DIAGNOSIS — E039 Hypothyroidism, unspecified: Secondary | ICD-10-CM | POA: Diagnosis not present

## 2022-05-28 DIAGNOSIS — I519 Heart disease, unspecified: Secondary | ICD-10-CM | POA: Diagnosis not present

## 2022-05-28 DIAGNOSIS — E785 Hyperlipidemia, unspecified: Secondary | ICD-10-CM | POA: Diagnosis not present

## 2022-05-28 DIAGNOSIS — R531 Weakness: Secondary | ICD-10-CM | POA: Diagnosis not present

## 2022-05-28 DIAGNOSIS — I10 Essential (primary) hypertension: Secondary | ICD-10-CM | POA: Diagnosis not present

## 2022-05-28 MED ORDER — MORPHINE SULFATE (CONCENTRATE) 20 MG/ML PO SOLN: ORAL | 0 refills | Status: DC

## 2022-05-28 MED ORDER — ATROPINE SULFATE 1 % OP SOLN: OPHTHALMIC | 0 refills | Status: DC

## 2022-05-28 MED ORDER — LORAZEPAM 2 MG/ML PO CONC: ORAL | 0 refills | Status: DC

## 2022-05-29 DIAGNOSIS — R531 Weakness: Secondary | ICD-10-CM | POA: Diagnosis not present

## 2022-05-29 DIAGNOSIS — R42 Dizziness and giddiness: Secondary | ICD-10-CM | POA: Diagnosis not present

## 2022-05-29 DIAGNOSIS — I82403 Acute embolism and thrombosis of unspecified deep veins of lower extremity, bilateral: Secondary | ICD-10-CM | POA: Diagnosis not present

## 2022-05-29 DIAGNOSIS — M1990 Unspecified osteoarthritis, unspecified site: Secondary | ICD-10-CM | POA: Diagnosis not present

## 2022-05-29 DIAGNOSIS — E43 Unspecified severe protein-calorie malnutrition: Secondary | ICD-10-CM | POA: Diagnosis not present

## 2022-05-29 DIAGNOSIS — I519 Heart disease, unspecified: Secondary | ICD-10-CM | POA: Diagnosis not present

## 2022-05-29 DIAGNOSIS — C259 Malignant neoplasm of pancreas, unspecified: Secondary | ICD-10-CM | POA: Diagnosis not present

## 2022-05-29 DIAGNOSIS — E785 Hyperlipidemia, unspecified: Secondary | ICD-10-CM | POA: Diagnosis not present

## 2022-05-29 DIAGNOSIS — N183 Chronic kidney disease, stage 3 unspecified: Secondary | ICD-10-CM | POA: Diagnosis not present

## 2022-05-29 DIAGNOSIS — E118 Type 2 diabetes mellitus with unspecified complications: Secondary | ICD-10-CM | POA: Diagnosis not present

## 2022-05-29 DIAGNOSIS — I10 Essential (primary) hypertension: Secondary | ICD-10-CM | POA: Diagnosis not present

## 2022-05-29 DIAGNOSIS — H903 Sensorineural hearing loss, bilateral: Secondary | ICD-10-CM | POA: Diagnosis not present

## 2022-05-29 DIAGNOSIS — G4730 Sleep apnea, unspecified: Secondary | ICD-10-CM | POA: Diagnosis not present

## 2022-05-29 DIAGNOSIS — E039 Hypothyroidism, unspecified: Secondary | ICD-10-CM | POA: Diagnosis not present

## 2022-05-30 DIAGNOSIS — E118 Type 2 diabetes mellitus with unspecified complications: Secondary | ICD-10-CM | POA: Diagnosis not present

## 2022-05-30 DIAGNOSIS — I82403 Acute embolism and thrombosis of unspecified deep veins of lower extremity, bilateral: Secondary | ICD-10-CM | POA: Diagnosis not present

## 2022-05-30 DIAGNOSIS — I519 Heart disease, unspecified: Secondary | ICD-10-CM | POA: Diagnosis not present

## 2022-05-30 DIAGNOSIS — E039 Hypothyroidism, unspecified: Secondary | ICD-10-CM | POA: Diagnosis not present

## 2022-05-30 DIAGNOSIS — E785 Hyperlipidemia, unspecified: Secondary | ICD-10-CM | POA: Diagnosis not present

## 2022-05-30 DIAGNOSIS — H903 Sensorineural hearing loss, bilateral: Secondary | ICD-10-CM | POA: Diagnosis not present

## 2022-05-30 DIAGNOSIS — R42 Dizziness and giddiness: Secondary | ICD-10-CM | POA: Diagnosis not present

## 2022-05-30 DIAGNOSIS — E43 Unspecified severe protein-calorie malnutrition: Secondary | ICD-10-CM | POA: Diagnosis not present

## 2022-05-30 DIAGNOSIS — M1990 Unspecified osteoarthritis, unspecified site: Secondary | ICD-10-CM | POA: Diagnosis not present

## 2022-05-30 DIAGNOSIS — N183 Chronic kidney disease, stage 3 unspecified: Secondary | ICD-10-CM | POA: Diagnosis not present

## 2022-05-30 DIAGNOSIS — G4730 Sleep apnea, unspecified: Secondary | ICD-10-CM | POA: Diagnosis not present

## 2022-05-30 DIAGNOSIS — R531 Weakness: Secondary | ICD-10-CM | POA: Diagnosis not present

## 2022-05-30 DIAGNOSIS — I10 Essential (primary) hypertension: Secondary | ICD-10-CM | POA: Diagnosis not present

## 2022-05-30 DIAGNOSIS — C259 Malignant neoplasm of pancreas, unspecified: Secondary | ICD-10-CM | POA: Diagnosis not present

## 2022-06-03 ENCOUNTER — Ambulatory Visit: Payer: Self-pay

## 2022-06-03 NOTE — Patient Outreach (Signed)
  Care Coordination   Follow Up Visit Note   06/03/2022 Name: Alexander Banks MRN: [id number] DOB: 1956-05-06  Alexander Banks is a 67 y.o. year old male who sees Luking, Elayne Snare, MD for primary care. I  spoke with spouse, Alexander Banks   Wife states patient is deceased.    Goals Addressed               This Visit's Progress     COMPLETED: obtain  drain exchange/evaluation [facility]) (pt-stated)        Patient deceased  Goals closed         SDOH assessments and interventions completed:  No     Care Coordination Interventions:  No, not indicated   Follow up plan: No further intervention required.   Encounter Outcome:  Pt. Visit Completed   Alexander Plowman RN,BSN,CCM [place] [phone] direct line

## 2022-06-20 DEATH — deceased

## 2022-07-05 ENCOUNTER — Other Ambulatory Visit: Payer: Self-pay | Admitting: "Endocrinology

## 2022-07-05 NOTE — Telephone Encounter (Signed)
Patient was last seen by Dr. Dorris Fetch on 05/10/2022. He has a follow up visit with Dr. Dorris Fetch on 07/16/2022 which he has cancelled. It appears that a Dr. Irwin Brakeman has been filling this prescription. The prescription is Humalog. Do you want to refill this?

## 2022-07-16 ENCOUNTER — Telehealth: Payer: BC Managed Care – PPO | Admitting: "Endocrinology

## 2023-12-15 NOTE — Progress Notes (Signed)
 Erroneous encounter
